# Patient Record
Sex: Female | Born: 1965 | Race: Black or African American | Hispanic: No | Smoking: Current every day smoker
Health system: Southern US, Community
[De-identification: ages and names within clinical notes are randomized; demographics above are authoritative.]

## PROBLEM LIST (undated history)

## (undated) DIAGNOSIS — F329 Major depressive disorder, single episode, unspecified: Secondary | ICD-10-CM

## (undated) DIAGNOSIS — R0600 Dyspnea, unspecified: Secondary | ICD-10-CM

## (undated) DIAGNOSIS — F32A Depression, unspecified: Secondary | ICD-10-CM

## (undated) DIAGNOSIS — Z923 Personal history of irradiation: Secondary | ICD-10-CM

## (undated) DIAGNOSIS — Z9221 Personal history of antineoplastic chemotherapy: Secondary | ICD-10-CM

## (undated) DIAGNOSIS — Z800 Family history of malignant neoplasm of digestive organs: Secondary | ICD-10-CM

## (undated) DIAGNOSIS — F17200 Nicotine dependence, unspecified, uncomplicated: Secondary | ICD-10-CM

## (undated) DIAGNOSIS — Z803 Family history of malignant neoplasm of breast: Secondary | ICD-10-CM

## (undated) DIAGNOSIS — D649 Anemia, unspecified: Secondary | ICD-10-CM

## (undated) DIAGNOSIS — I319 Disease of pericardium, unspecified: Secondary | ICD-10-CM

## (undated) DIAGNOSIS — C801 Malignant (primary) neoplasm, unspecified: Secondary | ICD-10-CM

## (undated) DIAGNOSIS — J45909 Unspecified asthma, uncomplicated: Secondary | ICD-10-CM

## (undated) DIAGNOSIS — K429 Umbilical hernia without obstruction or gangrene: Secondary | ICD-10-CM

## (undated) HISTORY — DX: Depression, unspecified: F32.A

## (undated) HISTORY — DX: Nicotine dependence, unspecified, uncomplicated: F17.200

## (undated) HISTORY — DX: Umbilical hernia without obstruction or gangrene: K42.9

## (undated) HISTORY — DX: Family history of malignant neoplasm of breast: Z80.3

## (undated) HISTORY — DX: Family history of malignant neoplasm of digestive organs: Z80.0

## (undated) HISTORY — DX: Major depressive disorder, single episode, unspecified: F32.9

## (undated) HISTORY — PX: ABLATION ON ENDOMETRIOSIS: SHX5787

## (undated) HISTORY — PX: BREAST BIOPSY: SHX20

## (undated) HISTORY — PX: TUBAL LIGATION: SHX77

## (undated) NOTE — *Deleted (*Deleted)
Patient Care Team: Avanell Shackleton, NP-C as PCP - General (Family Medicine) Quintella Reichert, MD as PCP - Cardiology (Cardiology) Donnelly Angelica, RN as Oncology Nurse Navigator Pershing Proud, RN as Oncology Nurse Navigator Harriette Bouillon, MD as Consulting Physician (General Surgery) Serena Croissant, MD as Consulting Physician (Hematology and Oncology) Antony Blackbird, MD as Consulting Physician (Radiation Oncology)  DIAGNOSIS: No diagnosis found.  SUMMARY OF ONCOLOGIC HISTORY: Oncology History  Malignant neoplasm of 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, estrogen receptor positive (HCC)  08/25/2020 Initial Diagnosis   Patient palpated a right breast lump x31yr and a left breast lump x33yr. Diagnostic mammogram and US showed in the right breast, a 2.7cm mass 5cm from the nipple and 0.8cm mass 1cm from the nipple at the 12:30 position, with borderline cortical thickening in the right axilla, and in the left breast, a 4.0cm mass at the 11 o'clock position representing a hamartoma. 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   09/13/2020 -  Chemotherapy   The patient had dexamethasone (DECADRON) 4 MG tablet, 8 mg, Oral, Daily, 1 of 1 cycle, Start date: 09/01/2020, End date: 10/04/2020 palonosetron (ALOXI) injection 0.25 mg, 0.25 mg, Intravenous,  Once, 3 of 6 cycles Administration: 0.25 mg (09/13/2020), 0.25 mg (10/04/2020), 0.25 mg (10/25/2020) pegfilgrastim-jmdb (FULPHILA) injection 6 mg, 6 mg, Subcutaneous,  Once, 1 of 1 cycle pegfilgrastim-cbqv (UDENYCA) injection 6 mg, 6 mg, Subcutaneous, Once, 3 of 6 cycles Administration: 6 mg (09/15/2020), 6 mg (10/06/2020) CARBOplatin (PARAPLATIN) 560 mg in sodium chloride 0.9 % 250 mL chemo infusion, 559.8 mg (100 % of original dose 559.8 mg), Intravenous,  Once, 3 of 6 cycles Dose modification:   (original  dose 559.8 mg, Cycle 1) Administration: 560 mg (09/13/2020), 560 mg (10/04/2020), 560 mg (10/25/2020) DOCEtaxel (TAXOTERE) 120 mg in sodium chloride 0.9 % 250 mL chemo infusion, 75 mg/m2 = 120 mg, Intravenous,  Once, 3 of 6 cycles Administration: 120 mg (09/13/2020), 120 mg (10/04/2020), 120 mg (10/25/2020) fosaprepitant (EMEND) 150 mg in sodium chloride 0.9 % 145 mL IVPB, 150 mg, Intravenous,  Once, 3 of 6 cycles Administration: 150 mg (09/13/2020), 150 mg (10/04/2020), 150 mg (10/25/2020) pertuzumab (PERJETA) 420 mg in sodium chloride 0.9 % 250 mL chemo infusion, 420 mg (100 % of original dose 420 mg), Intravenous, Once, 3 of 6 cycles Dose modification: 420 mg (original dose 420 mg, Cycle 1, Reason: Provider Judgment) Administration: 420 mg (09/13/2020), 420 mg (10/04/2020), 420 mg (10/25/2020) trastuzumab-anns (KANJINTI) 420 mg in sodium chloride 0.9 % 250 mL chemo infusion, 8 mg/kg = 420 mg, Intravenous,  Once, 3 of 6 cycles Administration: 420 mg (09/13/2020), 300 mg (10/04/2020), 300 mg (10/25/2020)  for chemotherapy treatment.      CHIEF COMPLIANT: Cycle 4 TCH Perjeta  INTERVAL HISTORY: Amanda Rich is a 27 y.o. with above-mentioned history of right breast cancer was currently on neoadjuvant chemotherapy with TCH Perjeta. She presents to the clinic today for cycle 4.   ALLERGIES:  is allergic to clarithromycin.  MEDICATIONS:  Current Outpatient Medications  Medication Sig Dispense Refill  . acetaminophen (TYLENOL) 500 MG tablet Take 500 mg by mouth every 6 (six) hours as needed for mild pain, fever or headache.    . Biotin 5000 MCG CAPS Take 5,000 mcg by mouth daily.     . Cholecalciferol (VITAMIN D3) 2000  units TABS Take 2,000 Units by mouth daily.     . citalopram (CELEXA) 10 MG tablet Take 1 tablet (10 mg total) by mouth daily. 90 tablet 3  . guaiFENesin (MUCINEX) 600 MG 12 hr tablet Take 600 mg by mouth 2 (two) times daily as needed for cough.    . lidocaine-prilocaine (EMLA) cream  Apply to affected area once (Patient taking differently: Apply 1 application topically as needed (port access). ) 30 g 3  . loperamide (IMODIUM) 2 MG capsule Take 1 capsule (2 mg total) by mouth as needed for diarrhea or loose stools. 30 capsule 0  . loratadine (CLARITIN) 10 MG tablet Take 1 tablet (10 mg total) by mouth daily.    Marland Kitchen LORazepam (ATIVAN) 0.5 MG tablet Take 1 tablet (0.5 mg total) by mouth at bedtime as needed (Nausea or vomiting). 30 tablet 0  . Multiple Vitamin (MULTIVITAMIN WITH MINERALS) TABS Take 1 tablet by mouth daily.    Marland Kitchen omeprazole (PRILOSEC) 20 MG capsule Take 1 capsule (20 mg total) by mouth daily.    . ondansetron (ZOFRAN) 8 MG tablet Take 1 tablet (8 mg total) by mouth 2 (two) times daily as needed (Nausea or vomiting). Start on the third day after chemotherapy. 30 tablet 1  . prochlorperazine (COMPAZINE) 10 MG tablet Take 1 tablet (10 mg total) by mouth every 6 (six) hours as needed (Nausea or vomiting). 30 tablet 1  . TURMERIC PO Take 1 tablet by mouth daily.      No current facility-administered medications for this visit.    PHYSICAL EXAMINATION: ECOG PERFORMANCE STATUS: {CHL ONC ECOG PS:[phone]}  There were no vitals filed for this visit. There were no vitals filed for this visit.   LABORATORY DATA:  I have reviewed the data as listed CMP Latest Ref Rng & Units 10/30/2020 10/25/2020 10/04/2020  Glucose 70 - 99 mg/dL 161(W) 93 960(A)  BUN 6 - 20 mg/dL 10 10 9   Creatinine 0.44 - 1.00 mg/dL 5.40 9.81 1.91  Sodium 135 - 145 mmol/L 137 141 140  Potassium 3.5 - 5.1 mmol/L 4.1 3.4(L) 3.4(L)  Chloride 98 - 111 mmol/L 101 108 106  CO2 22 - 32 mmol/L 27 25 27   Calcium 8.9 - 10.3 mg/dL 9.1 9.0 9.7  Total Protein 6.5 - 8.1 g/dL 7.3 6.2(L) 6.9  Total Bilirubin 0.3 - 1.2 mg/dL 0.9 0.3 4.7(W)  Alkaline Phos 38 - 126 U/L 91 80 85  AST 15 - 41 U/L 50(H) 42(H) 67(H)  ALT 0 - 44 U/L 166(H) 53(H) 139(H)    Lab Results  Component Value Date   WBC 6.4 10/30/2020    HGB 10.7 (L) 10/30/2020   HCT 32.4 (L) 10/30/2020   MCV 88.3 10/30/2020   PLT 136 (L) 10/30/2020   NEUTROABS 4.3 10/30/2020    ASSESSMENT & PLAN:  No problem-specific Assessment & Plan notes found for this encounter.    No orders of the defined types were placed in this encounter.  The patient has a good understanding of the overall plan. she agrees with it. she will call with any problems that may develop before the next visit here.  Total time spent: *** mins including face to face time and time spent for planning, charting and coordination of care  Serena Croissant, MD 11/14/2020  I, Kirt Boys Dorshimer, am acting as scribe for Dr. Serena Croissant.  {insert scribe attestation}

---

## 2003-12-26 HISTORY — PX: UMBILICAL HERNIA REPAIR: SHX196

## 2005-12-25 HISTORY — PX: GANGLION CYST EXCISION: SHX1691

## 2006-02-07 ENCOUNTER — Encounter (INDEPENDENT_AMBULATORY_CARE_PROVIDER_SITE_OTHER): Payer: Self-pay | Admitting: Orthopaedic Surgery

## 2006-02-08 ENCOUNTER — Ambulatory Visit (HOSPITAL_COMMUNITY): Admission: RE | Admit: 2006-02-08 | Discharge: 2006-02-08 | Payer: Self-pay | Admitting: Orthopaedic Surgery

## 2009-03-02 ENCOUNTER — Ambulatory Visit (HOSPITAL_COMMUNITY): Admission: RE | Admit: 2009-03-02 | Discharge: 2009-03-02 | Payer: Self-pay | Admitting: Family Medicine

## 2009-03-02 IMAGING — MG MM DIGITAL [PERSON_NAME] SCREEN BILAT
5 series · 5 of 5 positions shown · non-contrast
Comparison: none

DG WINNY SCREENING BILAT
Bilateral CC and MLO view(s) were taken.

DIGITAL SCREENING MAMMOGRAM WITH CAD:
The breast tissue is heterogeneously dense.  There is no dominant mass, architectural distortion or
calcification to suggest malignancy.

[R CC]
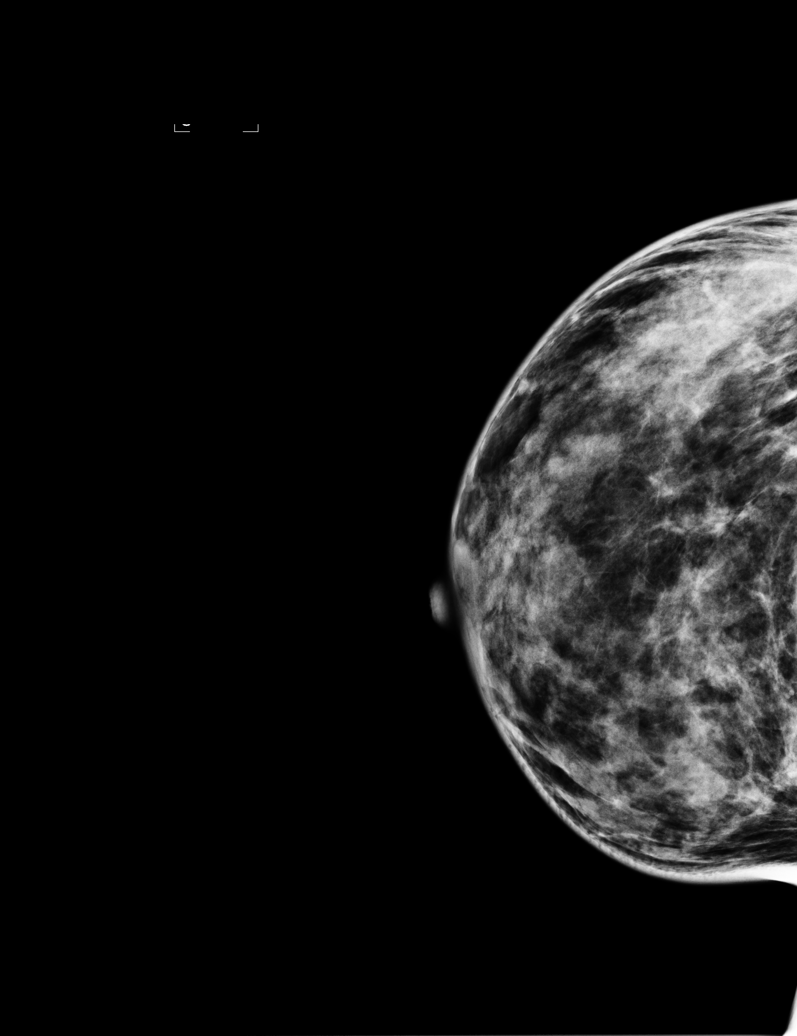

[R MLO]
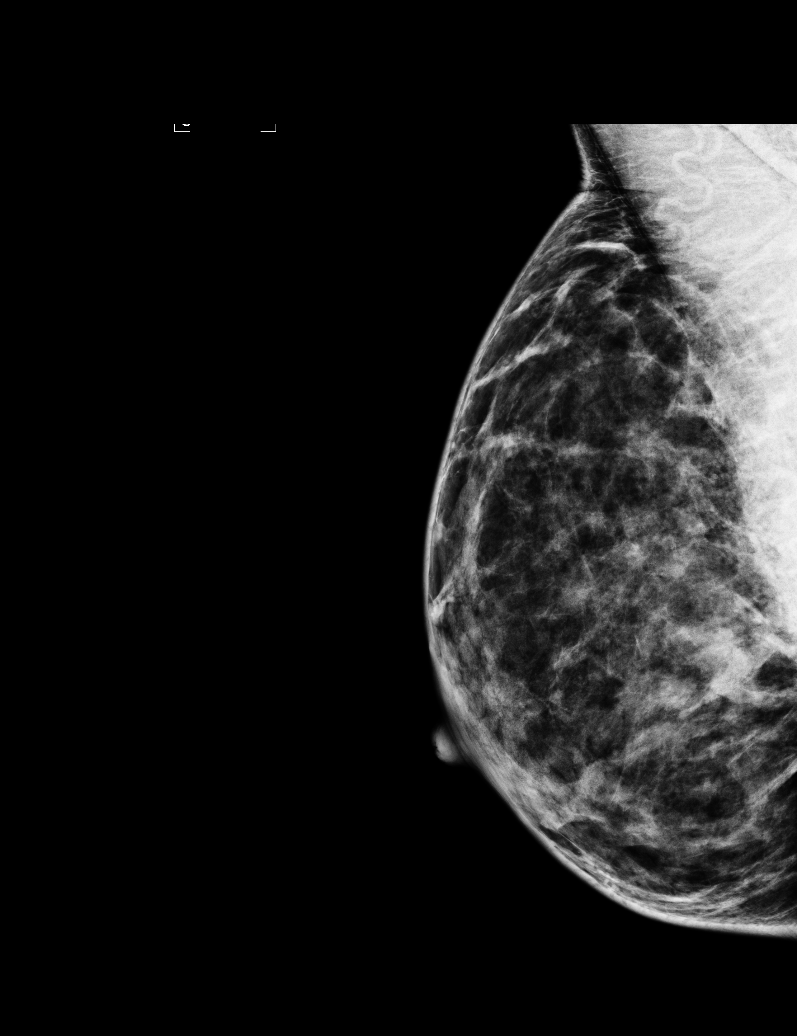

[L CC]
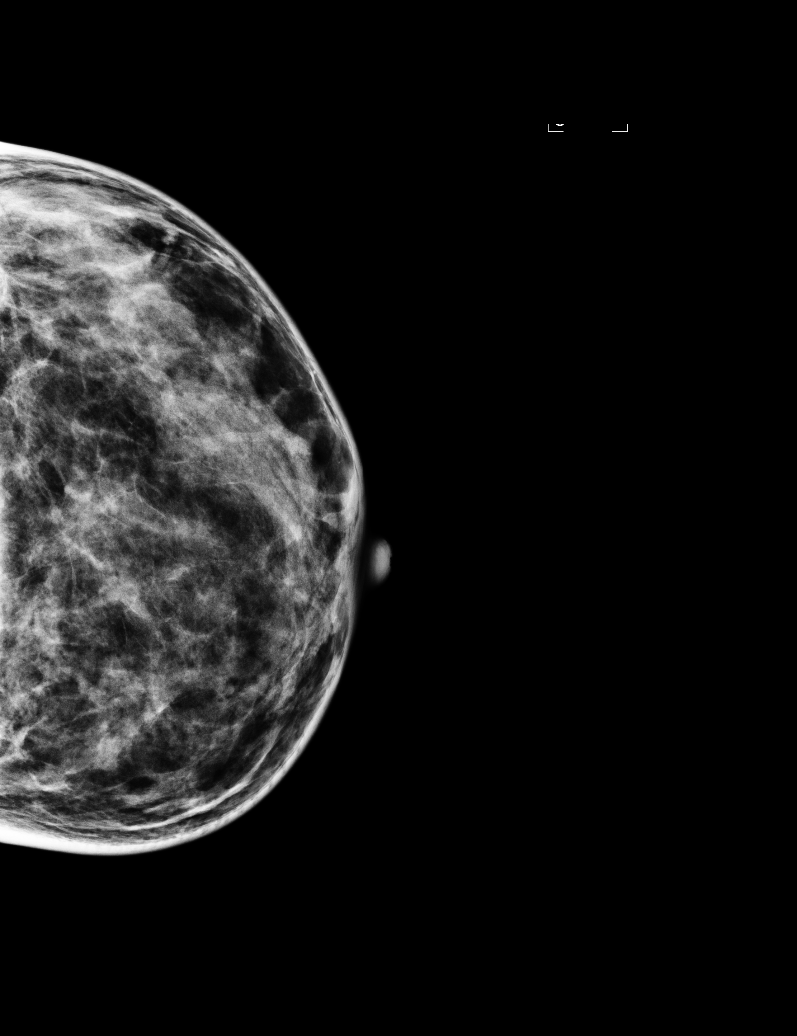

[L MLO (1 of 2)]
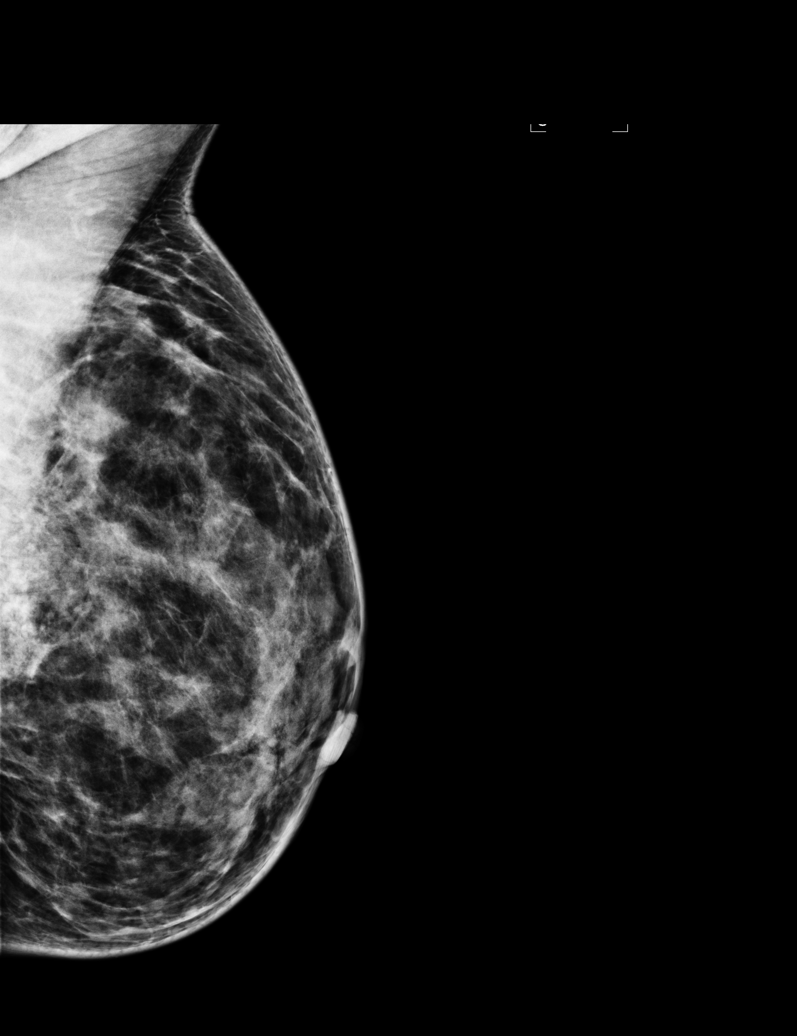

[L MLO (2 of 2)]
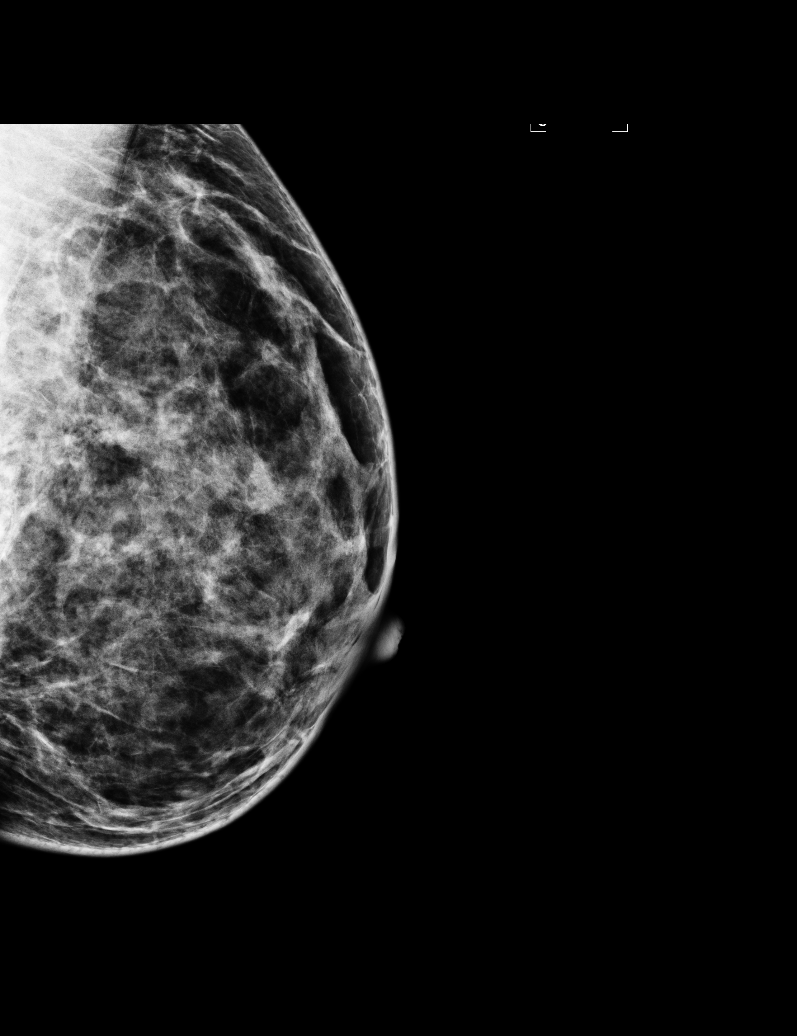

[5 of 5 positions shown; findings below may reference images not displayed]

IMPRESSION: No mammographic evidence of malignancy.  Suggest yearly screening mammography.

A result letter of this screening mammogram will be mailed directly to the patient.

ASSESSMENT: Negative - BI-RADS 1

Screening mammogram in 1 year.
THIS WAS ANALAYZED BY COMPUTER AIDED DETECTION. ,

## 2009-05-17 ENCOUNTER — Ambulatory Visit (HOSPITAL_COMMUNITY): Admission: RE | Admit: 2009-05-17 | Discharge: 2009-05-17 | Payer: Self-pay | Admitting: Family Medicine

## 2009-05-17 LAB — HM DEXA SCAN: HM Dexa Scan: NORMAL

## 2010-06-06 ENCOUNTER — Emergency Department (HOSPITAL_COMMUNITY): Admission: EM | Admit: 2010-06-06 | Discharge: 2010-06-06 | Payer: Self-pay | Admitting: Emergency Medicine

## 2010-12-31 ENCOUNTER — Emergency Department (HOSPITAL_COMMUNITY)
Admission: EM | Admit: 2010-12-31 | Discharge: 2011-01-01 | Payer: Self-pay | Source: Home / Self Care | Admitting: Emergency Medicine

## 2011-01-09 LAB — RAPID STREP SCREEN (MED CTR MEBANE ONLY): Streptococcus, Group A Screen (Direct): NEGATIVE

## 2011-01-16 ENCOUNTER — Encounter: Payer: Self-pay | Admitting: Family Medicine

## 2011-05-06 ENCOUNTER — Emergency Department (HOSPITAL_COMMUNITY)
Admission: EM | Admit: 2011-05-06 | Discharge: 2011-05-07 | Disposition: A | Discharge status: Home / Self Care | Payer: Self-pay | Attending: Emergency Medicine | Admitting: Emergency Medicine

## 2011-05-06 DIAGNOSIS — F17200 Nicotine dependence, unspecified, uncomplicated: Secondary | ICD-10-CM | POA: Insufficient documentation

## 2011-05-06 DIAGNOSIS — R109 Unspecified abdominal pain: Secondary | ICD-10-CM | POA: Insufficient documentation

## 2011-05-06 DIAGNOSIS — J45909 Unspecified asthma, uncomplicated: Secondary | ICD-10-CM | POA: Insufficient documentation

## 2011-05-07 ENCOUNTER — Emergency Department (HOSPITAL_COMMUNITY): Payer: Self-pay

## 2011-05-07 LAB — URINALYSIS, ROUTINE W REFLEX MICROSCOPIC
Bilirubin Urine: NEGATIVE
Glucose, UA: NEGATIVE mg/dL
Ketones, ur: NEGATIVE mg/dL
Nitrite: NEGATIVE
Protein, ur: NEGATIVE mg/dL
Specific Gravity, Urine: 1.02 (ref 1.005–1.030)
Urobilinogen, UA: 2 mg/dL — ABNORMAL HIGH (ref 0.0–1.0)
pH: 7.5 (ref 5.0–8.0)

## 2011-05-07 LAB — URINE MICROSCOPIC-ADD ON

## 2011-05-07 IMAGING — CR DG ABDOMEN 2V
2 series · 2 of 2 positions shown · non-contrast
Comparison: None

CLINICAL DATA: Abdominal pain, umbilical pain, cramping

ABDOMEN - 2 VIEW

[view not recorded (1 of 2)]
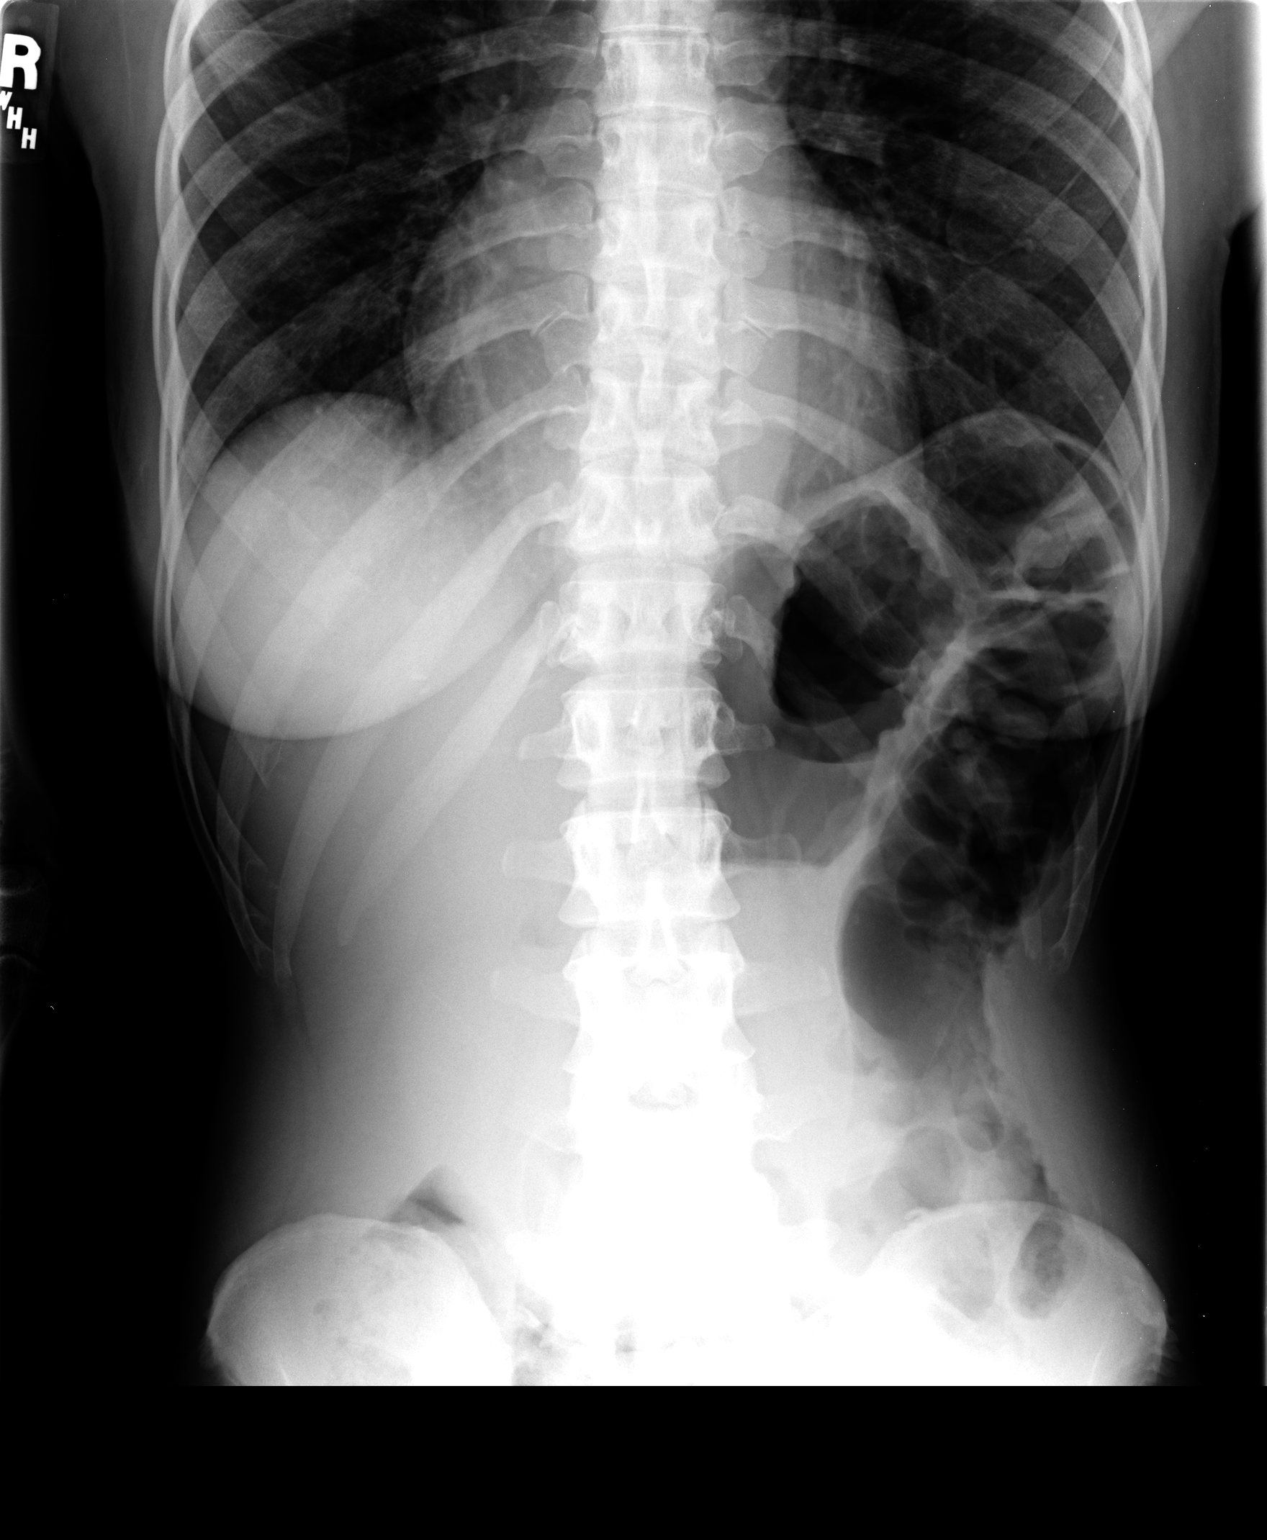

[view not recorded (2 of 2)]
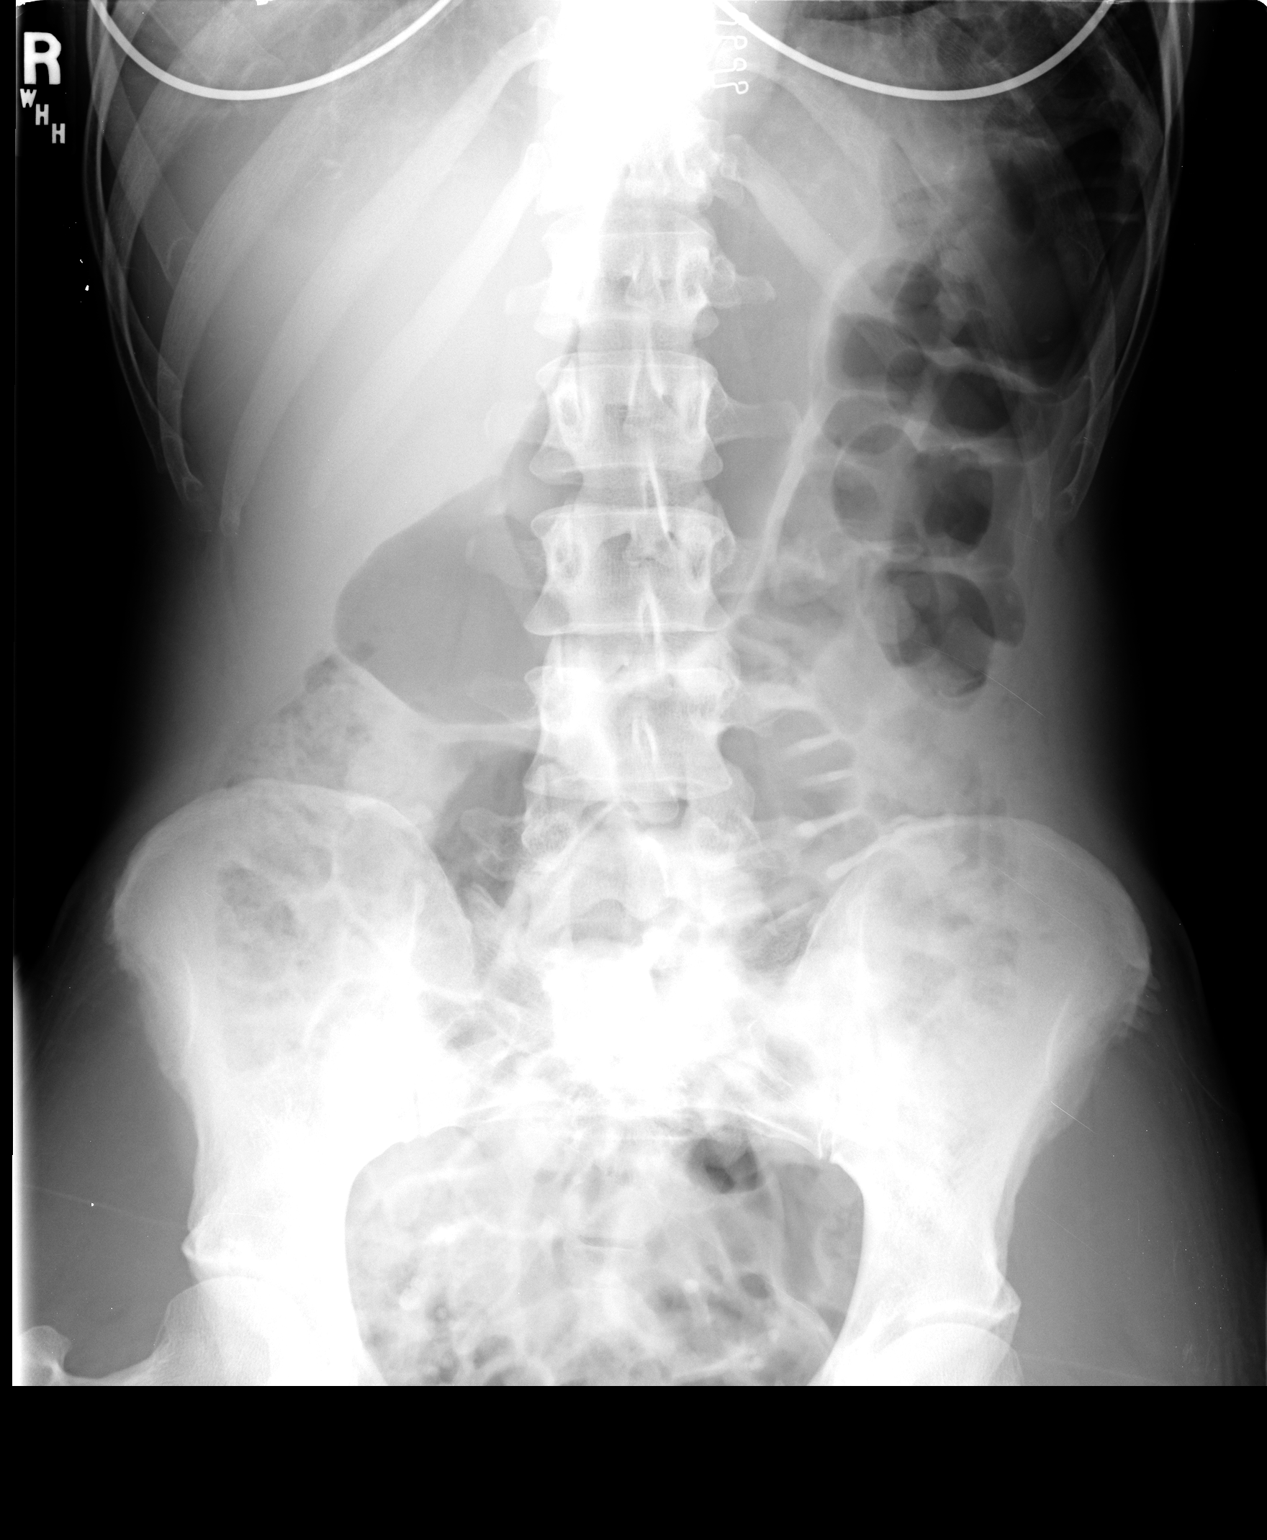

[2 of 2 positions shown; findings below may reference images not displayed]

FINDINGS: Air filled loops of large and small bowel throughout abdomen.
Minimal gaseous distention of splenic flexure.
Overall bowel gas pattern is nonobstructive.
No bowel wall thickening or free intraperitoneal air.
Lung bases clear.
Osseous structures unremarkable.
No urinary tract calcification.
IMPRESSION: Nonobstructive bowel gas pattern.

## 2011-05-12 NOTE — Op Note (Signed)
NAME:  Amanda Rich, Amanda Rich             ACCOUNT NO.:  [id number]   MEDICAL RECORD NO.:  [id number]          PATIENT TYPE:  AMB   LOCATION:  DAY                           FACILITY:  [facility]   PHYSICIAN:  J. Darreld Mclean, M.D. DATE OF BIRTH:  1966/06/19   DATE OF PROCEDURE:  02/08/2006  DATE OF DISCHARGE:                                 OPERATIVE REPORT   PREOPERATIVE DIAGNOSIS:  Ganglion cyst, left volar wrist and radial artery.   POSTOPERATIVE DIAGNOSIS:  Ganglion cyst, left volar wrist and radial artery.   PROCEDURE:  Excision of ganglion cyst, left volar wrist.   ANESTHESIA:  Bier block.   SURGEON:  Dr. Hilda Lias.   Volar plasty splint applied at the end of the procedure. No drains were  used.   INDICATIONS:  The patient is a 45 year old female with pain and tenderness  of the left wrist. She had a large cystic lesion that is painful. It has  been aspirated several times over the last several years but keeps coming  back. Each time, it gets a little bigger and more painful. It has limited  the action of her wrist, and she would like to have it excised at this time.  She understands that it can recur. Risks and imponderables were discussed  with the patient preoperatively. She appeared to understand and agreed to  the procedure as outlined.   DESCRIPTION OF PROCEDURE:  The patient was seen in the holding area, and the  left wrist was identified as the correct surgical site. She was placed a  mark over the cyst, and I placed a mark over it. She was brought back to the  operating room and given Bier block anesthesia while supine on the operating  room table. The hand table was attached. Completed a time out, identified  the patient and during surgery on the left wrist. She was prepped and draped  in the usual manner. Incision made over the lesion in line with the skin  lines. I used loop magnification. With careful dissection, the cyst was  exposed. With careful meticulous dissection,  the radial artery was  identified, and it went right down through almost the center of the cyst on  the most inferior aspect. With careful dissection, the so-called stalk was  identified. Radial artery was protected. The so-called stalk area was  removed along with the cyst. I used the Bovie around the area of the so-  called stalk on the area that was coming deep within the wrist. Artery was  reinspected. No apparent injury. Wounds were reapproximated using 3-0  chromic, 3-0 plain and a running subcuticular 3-0 nylon suture. Sterile  dressing applied. Bulky dressing applied. Volar plasty splint applied. Ace  bandage applied loosely. The patient tolerated the procedure well and went  to recovery room in good condition. I  will see in the office in approximately 10 days to 2 weeks. She has no  allergies and will be given a prescription for Vicodin ES for pain. If any  difficulties, she can contact me through the office or hospital beeper  system. Numbers have been provided.  ______________________________  Shela Commons. Darreld Mclean, M.D.     JWK/MEDQ  D:  02/08/2006  T:  02/08/2006  Job:  [id number]

## 2011-05-12 NOTE — H&P (Signed)
NAME:  Amanda Rich, Amanda Rich             ACCOUNT NO.:  [id number]   MEDICAL RECORD NO.:  [id number]          PATIENT TYPE:  AMB   LOCATION:  DAY                           FACILITY:  [facility]   PHYSICIAN:  J. Darreld Mclean, M.D. DATE OF BIRTH:  12-08-1966   DATE OF ADMISSION:  DATE OF DISCHARGE:  LH                                HISTORY & PHYSICAL   CHIEF COMPLAINT:  I have got a cyst on my left wrist.   The patient is a 45 year old female with a large ganglion cyst to the left  volar wrist at the radial artery.  This has been present for some time.  It  was last excised here in the office in March 2005.  It had been present back  in 2004.  Each time it comes back, she says it is a little bigger and a  little larger, and just more painful.  She would like to have it excised at  this time.  She denies any falls or trauma to the wrist.   PAST HISTORY:  Negative.  She does have a history of asthma since childhood.   She has no allergies.   She currently uses vitamins and Flexeril.  She has used albuterol inhalers  as needed.   She smokes a pack a day, does not use alcoholic beverages, high school  graduate.  She goes to Dr. Lysbeth Galas in [place] as her family physician.   She is status post repair of an umbilical hernia in 2003.   The patient lives in [place].  She is married.  She works at [employer].   PHYSICAL EXAMINATION:  VITAL SIGNS:  Normal.  HEENT:  Negative.  NECK:  Supple.  LUNGS:  Clear to P&A.  HEART:  Regular without murmur heard.  ABDOMEN:  Soft nontender without masses.  EXTREMITIES:  A large ganglion cyst, left dorsal wrist, about the size of a  large grape.  It is over the radial artery area.  It is tender.  range of  motion of the wrist is full but she says it hurts when she has full flexion.  There are no neurologic problems.  CNS:  Intact.  SKIN:  Intact.   IMPRESSION:  Large ganglion cyst, left dorsal wrist.   PLAN:  Excision of ganglion cyst, left  dorsal wrist.  __________  radial  artery.  We need to be careful of any possible injury to that.  She is  scheduled as an outpatient.  She understands she will be in a splint for  approximately two weeks post surgery.   LABORATORY:  Pending.                                            ______________________________  J. Darreld Mclean, M.D.     JWK/MEDQ  D:  02/06/2006  T:  02/06/2006  Job:  [id number]

## 2012-02-27 ENCOUNTER — Other Ambulatory Visit (HOSPITAL_COMMUNITY): Payer: Self-pay | Admitting: Family Medicine

## 2012-02-27 DIAGNOSIS — N63 Unspecified lump in unspecified breast: Secondary | ICD-10-CM

## 2012-03-13 ENCOUNTER — Inpatient Hospital Stay (HOSPITAL_COMMUNITY): Admission: RE | Admit: 2012-03-13 | Payer: Self-pay | Source: Ambulatory Visit

## 2012-03-27 ENCOUNTER — Inpatient Hospital Stay (HOSPITAL_COMMUNITY): Admission: RE | Admit: 2012-03-27 | Payer: Self-pay | Source: Ambulatory Visit

## 2012-04-17 ENCOUNTER — Other Ambulatory Visit (HOSPITAL_COMMUNITY): Payer: Self-pay | Admitting: Family Medicine

## 2012-04-17 ENCOUNTER — Ambulatory Visit (HOSPITAL_COMMUNITY)
Admission: RE | Admit: 2012-04-17 | Discharge: 2012-04-17 | Disposition: A | Discharge status: Home / Self Care | Payer: Self-pay | Source: Ambulatory Visit | Attending: Family Medicine | Admitting: Family Medicine

## 2012-04-17 DIAGNOSIS — N63 Unspecified lump in unspecified breast: Secondary | ICD-10-CM

## 2012-04-17 DIAGNOSIS — D249 Benign neoplasm of unspecified breast: Secondary | ICD-10-CM | POA: Insufficient documentation

## 2012-07-08 ENCOUNTER — Encounter (HOSPITAL_COMMUNITY): Payer: Self-pay | Admitting: Pharmacy Technician

## 2012-07-09 NOTE — Patient Instructions (Addendum)
22 Amanda Rich  07/09/2012   Your procedure is scheduled on:  Tuesday, 07/16/12  Report to [facility] at 0840 AM.  Call this number if you have problems the morning of surgery: [phone]   Remember:   Do not eat food:After Midnight.  May have clear liquids:until Midnight .  Clear liquids include soda, tea, black coffee, apple or grape juice, broth.  Take these medicines the morning of surgery with A SIP OF WATER: none   Do not wear jewelry, make-up or nail polish.  Do not wear lotions, powders, or perfumes. You may wear deodorant.  Do not shave 48 hours prior to surgery. Men may shave face and neck.  Do not bring valuables to the hospital.  Contacts, dentures or bridgework may not be worn into surgery.  Leave suitcase in the car. After surgery it may be brought to your room.  For patients admitted to the hospital, checkout time is 11:00 AM the day of discharge.   Patients discharged the day of surgery will not be allowed to drive home.  Name and phone number of your driver: family  Special Instructions: CHG Shower Use Special Wash: 1/2 bottle night before surgery and 1/2 bottle morning of surgery.   Please read over the following fact sheets that you were given: Pain Booklet, MRSA Information, Surgical Site Infection Prevention, Anesthesia Post-op Instructions and Care and Recovery After Surgery   Dilation and Curettage or Vacuum Curettage Dilation and curettage (D&C) and vacuum curettage are minor procedures. A D&C involves stretching (dilation) the cervix and scraping (curettage) the inside lining of the womb (uterus). During a D&C, tissue is gently scraped from the inside lining of the uterus. During a vacuum curettage, the lining and tissue in the uterus are removed with the use of gentle suction. Curettage may be performed for diagnostic or therapeutic purposes. As a diagnostic procedure, curettage is performed for the purpose of examining tissues from the uterus. Tissue  examination may help determine causes or treatment options for symptoms. A diagnostic curettage may be performed for the following symptoms:  Irregular bleeding in the uterus.   Bleeding with the development of clots.   Spotting between menstrual periods.   Prolonged menstrual periods.   Bleeding after menopause.   No menstrual period (amenorrhea).   A change in size and shape of the uterus.  A therapeutic curettage is performed to remove tissue, blood, or a contraceptive device. Therapeutic curettage may be performed for the following conditions:   Removal of an IUD (intrauterine device).   Removal of retained placenta after giving birth. Retained placenta can cause bleeding severe enough to require transfusions or an infection.   Abortion.   Miscarriage.   Removal of polyps inside the uterus.   Removal of uncommon types of fibroids (noncancerous lumps).  LET YOUR CAREGIVER KNOW ABOUT:   Allergies to food or medicine.   Medicines taken, including vitamins, herbs, eyedrops, over-the-counter medicines, and creams.   Use of steroids (by mouth or creams).   Previous problems with anesthetics or numbing medicines.   History of bleeding problems or blood clots.   Previous surgery.   Other health problems, including diabetes and kidney problems.   Possibility of pregnancy, if this applies.  RISKS AND COMPLICATIONS   Excessive bleeding.   Infection of the uterus.   Damage to the cervix.   Development of scar tissue (adhesions) inside the uterus, later causing abnormal amounts of menstrual bleeding.   Complications from the general anesthetic, if a  general anesthetic is used.   Putting a hole (perforation) in the uterus. This is rare.  BEFORE THE PROCEDURE   Eat and drink before the procedure only as directed by your caregiver.   Arrange for someone to take you home.  PROCEDURE   This procedure may be done in a hospital, outpatient clinic, or caregiver's  office.   You may be given a general anesthetic or a local anesthetic in and around the cervix.   You will lie on your back with your legs in stirrups.   There are two ways in which your cervix can be softened and dilated. These include:   Taking a medicine.   Having thin rods (laminaria) inserted into your cervix.   A curved tool (curette) will scrape cells from the inside lining of the uterus and will then be removed.  This procedure usually takes about 15 to 30 minutes. AFTER THE PROCEDURE   You will rest in the recovery area until you are stable and are ready to go home.   You will need to have someone take you home.   You may feel sick to your stomach (nauseous) or throw up (vomit) if you had general anesthesia.   You may have a sore throat if a tube was placed in your throat during general anesthesia.   You may have light cramping and bleeding for 2 days to 2 weeks after the procedure.   Your uterus needs to make a new lining after the procedure. This may make your next period late.  Document Released: 12/11/2005 Document Revised: 11/30/2011 Document Reviewed: 07/09/2009 [facility] Patient Information 2012 [place], [place]. PATIENT INSTRUCTIONS POST-ANESTHESIA  IMMEDIATELY FOLLOWING SURGERY:  Do not drive or operate machinery for the first twenty four hours after surgery.  Do not make any important decisions for twenty four hours after surgery or while taking narcotic pain medications or sedatives.  If you develop intractable nausea and vomiting or a severe headache please notify your doctor immediately.  FOLLOW-UP:  Please make an appointment with your surgeon as instructed. You do not need to follow up with anesthesia unless specifically instructed to do so.  WOUND CARE INSTRUCTIONS (if applicable):  Keep a dry clean dressing on the anesthesia/puncture wound site if there is drainage.  Once the wound has quit draining you may leave it open to air.  Generally you should leave  the bandage intact for twenty four hours unless there is drainage.  If the epidural site drains for more than 36-48 hours please call the anesthesia department.  QUESTIONS?:  Please feel free to call your physician or the hospital operator if you have any questions, and they will be happy to assist you.

## 2012-07-10 ENCOUNTER — Other Ambulatory Visit: Payer: Self-pay | Admitting: Obstetrics and Gynecology

## 2012-07-10 ENCOUNTER — Encounter (HOSPITAL_COMMUNITY)
Admission: RE | Admit: 2012-07-10 | Discharge: 2012-07-10 | Disposition: A | Discharge status: Home / Self Care | Payer: BC Managed Care – PPO | Source: Ambulatory Visit | Attending: Obstetrics and Gynecology | Admitting: Obstetrics and Gynecology

## 2012-07-10 ENCOUNTER — Encounter (HOSPITAL_COMMUNITY): Payer: Self-pay

## 2012-07-10 HISTORY — DX: Anemia, unspecified: D64.9

## 2012-07-10 LAB — CBC
HCT: 33.4 % — ABNORMAL LOW (ref 36.0–46.0)
Hemoglobin: 10.9 g/dL — ABNORMAL LOW (ref 12.0–15.0)
MCH: 27.7 pg (ref 26.0–34.0)
MCHC: 32.6 g/dL (ref 30.0–36.0)
MCV: 85 fL (ref 78.0–100.0)
Platelets: 217 10*3/uL (ref 150–400)
RBC: 3.93 MIL/uL (ref 3.87–5.11)
RDW: 14.6 % (ref 11.5–15.5)
WBC: 4.9 10*3/uL (ref 4.0–10.5)

## 2012-07-10 LAB — URINALYSIS, ROUTINE W REFLEX MICROSCOPIC
Bilirubin Urine: NEGATIVE
Glucose, UA: NEGATIVE mg/dL
Ketones, ur: NEGATIVE mg/dL
Leukocytes, UA: NEGATIVE
Nitrite: NEGATIVE
Protein, ur: NEGATIVE mg/dL
Specific Gravity, Urine: <1.005 — ABNORMAL LOW (ref 1.005–1.030)
Urobilinogen, UA: 0.2 mg/dL (ref 0.0–1.0)
pH: 6 (ref 5.0–8.0)

## 2012-07-10 LAB — SURGICAL PCR SCREEN
MRSA, PCR: NEGATIVE
Staphylococcus aureus: NEGATIVE

## 2012-07-10 LAB — HCG, SERUM, QUALITATIVE: Preg, Serum: NEGATIVE

## 2012-07-10 LAB — URINE MICROSCOPIC-ADD ON

## 2012-07-10 NOTE — H&P (Signed)
Amanda Rich is an 46 y.o. female. She is a gravida 3 para 3 status post tubal ligation with LMP 6/17-25/ 2013 she is admitted for hysteroscopy D&C and endometrial ablation for heavy menses that are also describes painful. She describes the pain of endometrial biopsy has equal to the day 1 and 2 of her menses which was benign 07/05/2012.  Pertinent Gynecological History: Menses: flow is excessive with use of Both pads or tampons on heaviest days Bleeding: No bleeding for 2 years until January 2013 with heavy excessive menstrual periods Since that time Contraception: tubal ligation DES exposure: unknown Blood transfusions: none Sexually transmitted diseases: no past history Previous GYN Procedures: 2 tubal ligation Last mammogram: Not applicable Date:  Last pap: normal Date: May 2013 OB History: G3, P3   Menstrual History: Menarche age: 57 No LMP recorded.    Past Medical History  Diagnosis Date  . Anemia     Past Surgical History  Procedure Date  . Tubal ligation     [facility]  . Umbilical hernia repair 2005    [facility]  . Ganglion cyst excision 2007    L wrist; [facility], [place]    No family history on file.  Social History:  reports that she has been smoking Cigarettes.  She has a 12.5 pack-year smoking history. She does not have any smokeless tobacco history on file. She reports that she drinks about .6 ounces of alcohol per week. She reports that she does not use illicit drugs.  Allergies: No Known Allergies  No prescriptions prior to admission    ROS menstrual periods are sufficiently heavy patient masses or close at work she uses depends diaper it were to prevent accidents with her menses during the first 2 days of her cycle she has no urinary tract symptoms and no difficulties with bowel movements. 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 Physical Exam Physical Examination: General appearance - alert, well appearing, and in no  distress, oriented to person, place, and time and normal appearing weight Mouth - dental hygiene good Chest - clear to auscultation, no wheezes, rales or rhonchi, symmetric air entry Heart - normal rate, regular rhythm, normal S1, S2, no murmurs, rubs, clicks or gallops, normal rate and regular rhythm Abdomen - soft, nontender, nondistended, no masses or organomegaly Pelvic - VULVA: normal appearing vulva with no masses, tenderness or lesions,              VAGINA: normal appearing vagina with normal color and discharge, no lesions, PELVIC FLOOR EXAM: no cystocele, rectocele or prolapse noted,              CERVIX: normal appearing cervix without discharge or lesions,              UTERUS: uterus is normal size, shape, consistency and nontender, anteverted, mobile, ultrasound reveals small uterine fibroids in the fundal portion of the endometrial body to 1.5 cm in diameter. The uterus is 11.1 x 6.5 x 5 cm and normal ovaries bilaterally without free fluid or any evidence of adnexal masses,              ADNEXA: normal adnexa in size, nontender and no masses Extremities - peripheral pulses normal, no pedal edema, no clubbing or cyanosis, Homan's sign negative bilaterally   Results for orders placed during the hospital encounter of 07/10/12 (from the past 24 hour(s))  CBC     Status: Abnormal   Collection Time  07/10/12  8:00 AM      Component Value Range   WBC 4.9  4.0 - 10.5 K/uL   RBC 3.93  3.87 - 5.11 MIL/uL   Hemoglobin 10.9 (*) 12.0 - 15.0 g/dL   HCT 19.1 (*) 47.8 - 29.5 %   MCV 85.0  78.0 - 100.0 fL   MCH 27.7  26.0 - 34.0 pg   MCHC 32.6  30.0 - 36.0 g/dL   RDW 62.1  30.8 - 65.7 %   Platelets 217  150 - 400 K/uL  HCG, SERUM, QUALITATIVE     Status: Normal   Collection Time   07/10/12  8:00 AM      Component Value Range   Preg, Serum NEGATIVE  NEGATIVE  SURGICAL PCR SCREEN     Status: Normal   Collection Time   07/10/12  8:03 AM      Component Value Range   MRSA, PCR NEGATIVE   NEGATIVE   Staphylococcus aureus NEGATIVE  NEGATIVE  URINALYSIS, ROUTINE W REFLEX MICROSCOPIC     Status: Abnormal   Collection Time   07/10/12  8:03 AM      Component Value Range   Color, Urine YELLOW  YELLOW   APPearance CLEAR  CLEAR   Specific Gravity, Urine <1.005 (*) 1.005 - 1.030   pH 6.0  5.0 - 8.0   Glucose, UA NEGATIVE  NEGATIVE mg/dL   Hgb urine dipstick LARGE (*) NEGATIVE   Bilirubin Urine NEGATIVE  NEGATIVE   Ketones, ur NEGATIVE  NEGATIVE mg/dL   Protein, ur NEGATIVE  NEGATIVE mg/dL   Urobilinogen, UA 0.2  0.0 - 1.0 mg/dL   Nitrite NEGATIVE  NEGATIVE   Leukocytes, UA NEGATIVE  NEGATIVE  URINE MICROSCOPIC-ADD ON     Status: Abnormal   Collection Time   07/10/12  8:03 AM      Component Value Range   Squamous Epithelial / LPF FEW (*) RARE   WBC, UA 0-2  <3 WBC/hpf   RBC / HPF 7-10  <3 RBC/hpf    No results found.  Assessment/Plan:Menorrhagia severe    Mild anemia    Small uterine fibroids  Plan: Hysteroscopy dilation and curettage with endometrial ablation to be performed next Tuesday  Abdoulie Tierce V 07/10/2012, 1:41 PM

## 2012-07-16 ENCOUNTER — Ambulatory Visit (HOSPITAL_COMMUNITY)
Admission: RE | Admit: 2012-07-16 | Discharge: 2012-07-16 | Disposition: A | Discharge status: Home / Self Care | Payer: BC Managed Care – PPO | Source: Ambulatory Visit | Attending: Obstetrics and Gynecology | Admitting: Obstetrics and Gynecology

## 2012-07-16 ENCOUNTER — Encounter (HOSPITAL_COMMUNITY): Payer: Self-pay | Admitting: *Deleted

## 2012-07-16 ENCOUNTER — Other Ambulatory Visit: Payer: Self-pay

## 2012-07-16 ENCOUNTER — Encounter (HOSPITAL_COMMUNITY): Payer: Self-pay | Admitting: Certified Registered"

## 2012-07-16 ENCOUNTER — Encounter (HOSPITAL_COMMUNITY)
Admission: RE | Disposition: A | Discharge status: Home / Self Care | Payer: Self-pay | Source: Ambulatory Visit | Attending: Obstetrics and Gynecology

## 2012-07-16 ENCOUNTER — Ambulatory Visit (HOSPITAL_COMMUNITY): Payer: BC Managed Care – PPO | Admitting: Certified Registered"

## 2012-07-16 DIAGNOSIS — N920 Excessive and frequent menstruation with regular cycle: Secondary | ICD-10-CM | POA: Insufficient documentation

## 2012-07-16 DIAGNOSIS — N924 Excessive bleeding in the premenopausal period: Secondary | ICD-10-CM | POA: Diagnosis present

## 2012-07-16 DIAGNOSIS — Z01812 Encounter for preprocedural laboratory examination: Secondary | ICD-10-CM | POA: Insufficient documentation

## 2012-07-16 SURGERY — DILATATION & CURETTAGE/HYSTEROSCOPY WITH THERMACHOICE ABLATION
Anesthesia: General | Site: Uterus | Wound class: Clean Contaminated

## 2012-07-16 MED ORDER — CEFAZOLIN SODIUM-DEXTROSE 2-3 GM-% IV SOLR: 2.0000 g | INTRAVENOUS | Status: DC

## 2012-07-16 MED ORDER — FENTANYL CITRATE 0.05 MG/ML IJ SOLN
25.0000 ug | INTRAMUSCULAR | Status: DC | PRN
  Administered 2012-07-16 (×2): 50 ug via INTRAVENOUS

## 2012-07-16 MED ORDER — DEXTROSE 5 % IV SOLN
INTRAVENOUS | Status: DC | PRN
  Administered 2012-07-16: 500 mL

## 2012-07-16 MED ORDER — FENTANYL CITRATE 0.05 MG/ML IJ SOLN
INTRAMUSCULAR | Status: DC | PRN
  Administered 2012-07-16: 50 ug via INTRAVENOUS

## 2012-07-16 MED ORDER — SODIUM CHLORIDE 0.9 % IR SOLN
Status: DC | PRN
  Administered 2012-07-16: 1000 mL
  Administered 2012-07-16: 3000 mL

## 2012-07-16 MED ORDER — ONDANSETRON HCL 4 MG/2ML IJ SOLN
INTRAMUSCULAR | Status: DC | PRN
  Administered 2012-07-16: 4 mg via INTRAVENOUS

## 2012-07-16 MED ORDER — LACTATED RINGERS IV SOLN
INTRAVENOUS | Status: DC | PRN
  Administered 2012-07-16 (×2): via INTRAVENOUS

## 2012-07-16 MED ORDER — LACTATED RINGERS IV SOLN
INTRAVENOUS | Status: DC
  Administered 2012-07-16: 10:00:00 via INTRAVENOUS

## 2012-07-16 MED ORDER — BUPIVACAINE-EPINEPHRINE 0.5% -1:200000 IJ SOLN
INTRAMUSCULAR | Status: DC | PRN
  Administered 2012-07-16: 20 mL

## 2012-07-16 MED ORDER — MIDAZOLAM HCL 2 MG/2ML IJ SOLN
INTRAMUSCULAR | Status: AC
  Filled 2012-07-16: qty 2

## 2012-07-16 MED ORDER — MIDAZOLAM HCL 2 MG/2ML IJ SOLN
1.0000 mg | INTRAMUSCULAR | Status: AC | PRN
  Administered 2012-07-16 (×3): 2 mg via INTRAVENOUS

## 2012-07-16 MED ORDER — PROPOFOL 10 MG/ML IV EMUL
INTRAVENOUS | Status: DC | PRN
  Administered 2012-07-16: 200 mg via INTRAVENOUS

## 2012-07-16 MED ORDER — KETOROLAC TROMETHAMINE 10 MG PO TABS: 10.0000 mg | ORAL_TABLET | Freq: Four times a day (QID) | ORAL | Status: AC | PRN

## 2012-07-16 MED ORDER — CEFAZOLIN SODIUM-DEXTROSE 2-3 GM-% IV SOLR
INTRAVENOUS | Status: AC
  Filled 2012-07-16: qty 50

## 2012-07-16 MED ORDER — CEFAZOLIN SODIUM 1-5 GM-% IV SOLN
INTRAVENOUS | Status: DC | PRN
  Administered 2012-07-16: 2 g via INTRAVENOUS

## 2012-07-16 MED ORDER — BUPIVACAINE-EPINEPHRINE PF 0.5-1:200000 % IJ SOLN
INTRAMUSCULAR | Status: AC
  Filled 2012-07-16: qty 10

## 2012-07-16 MED ORDER — PROPOFOL 10 MG/ML IV EMUL
INTRAVENOUS | Status: AC
  Filled 2012-07-16: qty 20

## 2012-07-16 MED ORDER — FENTANYL CITRATE 0.05 MG/ML IJ SOLN
INTRAMUSCULAR | Status: AC
  Filled 2012-07-16: qty 2

## 2012-07-16 MED ORDER — FENTANYL CITRATE 0.05 MG/ML IJ SOLN
INTRAMUSCULAR | Status: AC
  Filled 2012-07-16: qty 5

## 2012-07-16 MED ORDER — ONDANSETRON HCL 4 MG/2ML IJ SOLN: 4.0000 mg | Freq: Once | INTRAMUSCULAR | Status: DC | PRN

## 2012-07-16 MED ORDER — OXYCODONE-ACETAMINOPHEN 5-325 MG PO TABS: 1.0000 | ORAL_TABLET | ORAL | Status: AC | PRN

## 2012-07-16 SURGICAL SUPPLY — 28 items
BAG DECANTER FOR FLEXI CONT (MISCELLANEOUS) ×2 IMPLANT
BAG HAMPER (MISCELLANEOUS) ×2 IMPLANT
CATH ROBINSON RED A/P 16FR (CATHETERS) IMPLANT
CATH THERMACHOICE III (CATHETERS) ×2 IMPLANT
CLOTH BEACON ORANGE TIMEOUT ST (SAFETY) ×2 IMPLANT
COVER LIGHT HANDLE STERIS (MISCELLANEOUS) ×4 IMPLANT
DECANTER SPIKE VIAL GLASS SM (MISCELLANEOUS) ×1 IMPLANT
FORMALIN 10 PREFIL 480ML (MISCELLANEOUS) ×1 IMPLANT
GLOVE BIOGEL PI IND STRL 7.0 (GLOVE) ×1 IMPLANT
GLOVE BIOGEL PI INDICATOR 7.0 (GLOVE) ×1
GLOVE ECLIPSE 9.0 STRL (GLOVE) ×2 IMPLANT
GLOVE EXAM NITRILE MD LF STRL (GLOVE) ×1 IMPLANT
GLOVE INDICATOR STER SZ 9 (GLOVE) ×2 IMPLANT
GLOVE OPTIFIT SS 6.5 STRL BRWN (GLOVE) ×2 IMPLANT
GOWN STRL REIN 3XL LVL4 (GOWN DISPOSABLE) ×2 IMPLANT
GOWN STRL REIN XL XLG (GOWN DISPOSABLE) ×3 IMPLANT
INST SET HYSTEROSCOPY (KITS) ×2 IMPLANT
IV D5W 500ML (IV SOLUTION) ×2 IMPLANT
IV NS IRRIG 3000ML ARTHROMATIC (IV SOLUTION) ×2 IMPLANT
KIT ROOM TURNOVER AP CYSTO (KITS) ×2 IMPLANT
MANIFOLD NEPTUNE II (INSTRUMENTS) ×2 IMPLANT
NS IRRIG 1000ML POUR BTL (IV SOLUTION) ×2 IMPLANT
PACK PERI GYN (CUSTOM PROCEDURE TRAY) ×2 IMPLANT
PAD ARMBOARD 7.5X6 YLW CONV (MISCELLANEOUS) ×2 IMPLANT
PAD TELFA 3X4 1S STER (GAUZE/BANDAGES/DRESSINGS) ×2 IMPLANT
SET BASIN LINEN APH (SET/KITS/TRAYS/PACK) ×2 IMPLANT
SET IRRIG Y TYPE TUR BLADDER L (SET/KITS/TRAYS/PACK) ×2 IMPLANT
SYR CONTROL 10ML LL (SYRINGE) ×2 IMPLANT

## 2012-07-16 NOTE — Preoperative (Signed)
Beta Blockers   Reason not to administer Beta Blockers:Not Applicable 

## 2012-07-16 NOTE — Interval H&P Note (Signed)
History and Physical Interval Note:  07/16/2012 10:28 AM  Amanda Rich  has presented today for surgery, with the diagnosis of menometrorrhagia  The various methods of treatment have been discussed with the patient and family. After consideration of risks, benefits and other options for treatment, the patient has consented to  Procedure(s) (LRB): DILATATION & CURETTAGE/HYSTEROSCOPY WITH THERMACHOICE ABLATION (N/A) as a surgical intervention .  The patient's history has been reviewed, patient examined, no change in status, stable for surgery.  I have reviewed the patient's chart and labs.  Questions were answered to the patient's satisfaction.    Patient's period was last week. [name]

## 2012-07-16 NOTE — Anesthesia Procedure Notes (Signed)
Procedure Name: LMA Insertion Date/Time: 07/16/2012 10:41 AM Performed by: [place] Score A Pre-anesthesia Checklist: Patient identified, Emergency Drugs available, Suction available and Patient being monitored Patient Re-evaluated:Patient Re-evaluated prior to inductionOxygen Delivery Method: Circle system utilized Preoxygenation: Pre-oxygenation with 100% oxygen Intubation Type: IV induction LMA: LMA inserted LMA Size: 4.0 Number of attempts: 1 Placement Confirmation: positive ETCO2 and breath sounds checked- equal and bilateral Tube secured with: Tape Dental Injury: Teeth and Oropharynx as per pre-operative assessment

## 2012-07-16 NOTE — Anesthesia Postprocedure Evaluation (Signed)
  Anesthesia Post-op Note  Patient: Amanda Rich  Procedure(s) Performed: Procedure(s) (LRB): DILATATION & CURETTAGE/HYSTEROSCOPY WITH THERMACHOICE ABLATION (N/A)  Patient Location: PACU  Anesthesia Type: General  Level of Consciousness: awake, alert  and patient cooperative  Airway and Oxygen Therapy: Patient Spontanous Breathing and Patient connected to nasal cannula oxygen  Post-op Pain: none  Post-op Assessment: Post-op Vital signs reviewed, Patient's Cardiovascular Status Stable and Respiratory Function Stable  Post-op Vital Signs: Reviewed and stable  Complications: No apparent anesthesia complications

## 2012-07-16 NOTE — Op Note (Signed)
See operative details included in the brief operative note 

## 2012-07-16 NOTE — Transfer of Care (Signed)
Immediate Anesthesia Transfer of Care Note  Patient: Amanda Rich  Procedure(s) Performed: Procedure(s) (LRB): DILATATION & CURETTAGE/HYSTEROSCOPY WITH THERMACHOICE ABLATION (N/A)  Patient Location: PACU  Anesthesia Type: General  Level of Consciousness: awake, alert  and patient cooperative  Airway & Oxygen Therapy: Patient Spontanous Breathing and Patient connected to nasal cannula oxygen  Post-op Assessment: Report given to PACU RN  Post vital signs: Reviewed and stable  Complications: No apparent anesthesia complications

## 2012-07-16 NOTE — Anesthesia Preprocedure Evaluation (Addendum)
Anesthesia Evaluation  Patient identified by MRN, date of birth, ID band Patient awake    Reviewed: Allergy & Precautions, H&P , NPO status , Patient's Chart, lab work & pertinent test results  History of Anesthesia Complications Negative for: history of anesthetic complications  Airway Mallampati: I      Dental  (+) Edentulous Upper   Pulmonary Current Smoker, PE: am cough. breath sounds clear to auscultation        Cardiovascular negative cardio ROS  Rhythm:Regular     Neuro/Psych    GI/Hepatic negative GI ROS,   Endo/Other    Renal/GU      Musculoskeletal   Abdominal   Peds  Hematology   Anesthesia Other Findings   Reproductive/Obstetrics                           Anesthesia Physical Anesthesia Plan  ASA: II  Anesthesia Plan: General   Post-op Pain Management:    Induction: Intravenous  Airway Management Planned: LMA  Additional Equipment:   Intra-op Plan:   Post-operative Plan:   Informed Consent:   Plan Discussed with:   Anesthesia Plan Comments:         Anesthesia Quick Evaluation

## 2012-07-16 NOTE — OR Nursing (Signed)
Complaints of  Chest tightness ,  Sat 97 ,98 %   B/p   153/88,    Hr  71.  Reported to Dr. Jayme Cloud

## 2012-07-16 NOTE — Brief Op Note (Signed)
07/16/2012  11:21 AM  PATIENT:  Amanda Rich  46 y.o. female  PRE-OPERATIVE DIAGNOSIS:  menometrorrhagia  POST-OPERATIVE DIAGNOSIS:  menometrorrhagia  PROCEDURE:  Procedure(s) (LRB): DILATATION & CURETTAGE/HYSTEROSCOPY WITH THERMACHOICE ABLATION (N/A)  SURGEON:  Surgeon(s) and Role:    * [name], MD - Primary  PHYSICIAN ASSISTANT:   ASSISTANTS: none   ANESTHESIA:   local and general  EBL:  Total I/O In: 1000 [I.V.:1000] Out: -   BLOOD ADMINISTERED:none  DRAINS: none   LOCAL MEDICATIONS USED:  Amount: 20 ml  SPECIMEN:  No Specimen  DISPOSITION OF SPECIMEN:  N/A  COUNTS:  YES  TOURNIQUET:  * No tourniquets in log *  DICTATION: .Dragon Dictation Patient was taken operating room prepped and draped for vaginal procedure with antibiotics administered and timeout conducted by surgical team. Cervix was grasped with single-tooth tenaculum sounded in the anteflexed position to 10 cm, dilated to 23 [place] allowing introduction of a 30 rigid hysteroscope which identified both tubal ostia and a thin endometrium with some tissue that was removed by curettage. Prior biopsy proven the tissue was benign. The Gynecare ThermaChoice 3 endometrial ablation device was then prepared, inserted and the 8 minute thermal ablation sequence completed. 13 cc of D5W were used and recovered completely. Paracervical block using 20 cc of Marcaine with epinephrine was then performed and patient allowed to go recovery room in stable condition sponge and needle counts correct. PLAN OF CARE: Discharge to home after PACU  PATIENT DISPOSITION:  PACU - hemodynamically stable.   Delay start of Pharmacological VTE agent (>24hrs) due to surgical blood loss or risk of bleeding: not applicable

## 2014-01-27 ENCOUNTER — Emergency Department (HOSPITAL_COMMUNITY)
Admission: EM | Admit: 2014-01-27 | Discharge: 2014-01-27 | Disposition: A | Discharge status: Home / Self Care | Payer: BC Managed Care – PPO | Attending: Emergency Medicine | Admitting: Emergency Medicine

## 2014-01-27 ENCOUNTER — Encounter (HOSPITAL_COMMUNITY): Payer: Self-pay | Admitting: Emergency Medicine

## 2014-01-27 DIAGNOSIS — Z9851 Tubal ligation status: Secondary | ICD-10-CM | POA: Insufficient documentation

## 2014-01-27 DIAGNOSIS — Z79899 Other long term (current) drug therapy: Secondary | ICD-10-CM | POA: Insufficient documentation

## 2014-01-27 DIAGNOSIS — Z862 Personal history of diseases of the blood and blood-forming organs and certain disorders involving the immune mechanism: Secondary | ICD-10-CM | POA: Insufficient documentation

## 2014-01-27 DIAGNOSIS — N390 Urinary tract infection, site not specified: Secondary | ICD-10-CM | POA: Insufficient documentation

## 2014-01-27 DIAGNOSIS — N949 Unspecified condition associated with female genital organs and menstrual cycle: Secondary | ICD-10-CM | POA: Insufficient documentation

## 2014-01-27 DIAGNOSIS — F17200 Nicotine dependence, unspecified, uncomplicated: Secondary | ICD-10-CM | POA: Insufficient documentation

## 2014-01-27 LAB — URINALYSIS, ROUTINE W REFLEX MICROSCOPIC
Bilirubin Urine: NEGATIVE
Glucose, UA: NEGATIVE mg/dL
Ketones, ur: NEGATIVE mg/dL
Nitrite: NEGATIVE
Protein, ur: NEGATIVE mg/dL
Specific Gravity, Urine: 1.02 (ref 1.005–1.030)
Urobilinogen, UA: 2 mg/dL — ABNORMAL HIGH (ref 0.0–1.0)
pH: 7.5 (ref 5.0–8.0)

## 2014-01-27 LAB — URINE MICROSCOPIC-ADD ON

## 2014-01-27 MED ORDER — NITROFURANTOIN MONOHYD MACRO 100 MG PO CAPS: 100.0000 mg | ORAL_CAPSULE | Freq: Two times a day (BID) | ORAL | Status: DC

## 2014-01-27 MED ORDER — PHENAZOPYRIDINE HCL 200 MG PO TABS: 200.0000 mg | ORAL_TABLET | Freq: Three times a day (TID) | ORAL | Status: DC

## 2014-01-27 NOTE — ED Provider Notes (Signed)
CSN: [id number]     Arrival date & time 01/27/14  0001 History  This chart was scribed for Varney Biles, MD by Zettie Pho, ED Scribe. This patient was seen in room [facility]12[facility]12 and the patient's care was started at 12:26 AM.    Chief Complaint  Patient presents with  . Dysuria   The history is provided by the patient. No language interpreter was used.   HPI Comments: Amanda Rich is a 48 y.o. female with a history of UTIs who presents to the Emergency Department complaining of a burning dysuria with associated vaginal itching onset a few days ago that has been progressively worsening. She states that her current symptoms feel similar to her past UTIs. She reports using an OTC diaper rash cream to the area without relief. She denies hematuria, urinary frequency or urgency, abdominal pain, back pain, nausea, emesis, fever, chills, vaginal discharge or bleeding. Patient states her menstrual period was last week and was normal. She reports that she is currently sexually active with one partner and denies possible STD exposure. Patient has a history of tubal ligation and ablation on endometriosis. Patient has a history of anemia.   Past Medical History  Diagnosis Date  . Anemia    Past Surgical History  Procedure Laterality Date  . Tubal ligation      [facility]  . Umbilical hernia repair  2005    [facility]  . Ganglion cyst excision  2007    L wrist; [facility], [place]   No family history on file. History  Substance Use Topics  . Smoking status: Current Every Day Smoker -- 0.50 packs/day for 25 years    Types: Cigarettes  . Smokeless tobacco: Not on file  . Alcohol Use: 0.6 oz/week    1 Glasses of wine per week   OB History   Grav Para Term Preterm Abortions TAB SAB Ect Mult Living                 Review of Systems  Constitutional: Negative for fever and chills.  Gastrointestinal: Negative for nausea, vomiting and abdominal pain.  Genitourinary: Positive for dysuria and vaginal pain. Negative  for urgency, frequency, hematuria, vaginal bleeding and vaginal discharge.  Musculoskeletal: Negative for back pain.    Allergies  Review of patient's allergies indicates no known allergies.  Home Medications   Current Outpatient Rx  Name  Route  Sig  Dispense  Refill  . Multiple Vitamin (MULTIVITAMIN WITH MINERALS) TABS   Oral   Take 1 tablet by mouth daily.         . nicotine (NICODERM CQ - DOSED IN MG/24 HOURS) 21 mg/24hr patch   Transdermal   Place 1 patch onto the skin daily.          Triage Vitals: BP 137/86  Pulse 80  Temp(Src) 97.8 F (36.6 C) (Oral)  Resp 18  Ht 5\' 5"  (1.651 m)  Wt 123 lb (55.792 kg)  BMI 20.47 kg/m2  SpO2 96%  LMP 01/24/2014  Physical Exam  Nursing note and vitals reviewed. Constitutional: She is oriented to person, place, and time. She appears well-developed and well-nourished. No distress.  HENT:  Head: Normocephalic and atraumatic.  Eyes: Conjunctivae are normal.  Neck: Normal range of motion. Neck supple.  Cardiovascular: Normal rate, regular rhythm and normal heart sounds.   Pulmonary/Chest: Effort normal and breath sounds normal. No respiratory distress.  Anterior auscultation of lungs is clear bilaterally.   Abdominal: Soft. Bowel sounds are normal. She exhibits  no distension. There is no tenderness.  Musculoskeletal: Normal range of motion.  Neurological: She is alert and oriented to person, place, and time.  Skin: Skin is warm and dry.  Psychiatric: She has a normal mood and affect. Her behavior is normal.    ED Course  Procedures (including critical care time)  DIAGNOSTIC STUDIES: Oxygen Saturation is 96% on room air, normal by my interpretation.    COORDINATION OF CARE: 12:29 PM- Will order UA. Discussed treatment plan with patient at bedside and patient verbalized agreement.     Labs Review Labs Reviewed - No data to display Imaging Review No results found.  EKG Interpretation   None       MDM  No  diagnosis found.  I personally performed the services described in this documentation, which was scribed in my presence. The recorded information has been reviewed and is accurate.  Pt with hx of UTI comes in with dysuria. She has no vaginal d/c, bleeding - and no pelvic tenderness on exam. [place] is unremarkable - but we will tx with macrobid as suspected UTI given the sx. She is immunocompetent. Repeat exam again is normal for the abd, and she has no std risk factors. PCp f/u requested if not better.   Varney Biles, MD 01/27/14 [phone]

## 2014-01-27 NOTE — Discharge Instructions (Signed)
Urinary Tract Infection  Urinary tract infections (UTIs) can develop anywhere along your urinary tract. Your urinary tract is your body's drainage system for removing wastes and extra water. Your urinary tract includes two kidneys, two ureters, a bladder, and a urethra. Your kidneys are a pair of bean-shaped organs. Each kidney is about the size of your fist. They are located below your ribs, one on each side of your spine.  CAUSES  Infections are caused by microbes, which are microscopic organisms, including fungi, viruses, and bacteria. These organisms are so small that they can only be seen through a microscope. Bacteria are the microbes that most commonly cause UTIs.  SYMPTOMS   Symptoms of UTIs may vary by age and gender of the patient and by the location of the infection. Symptoms in young women typically include a frequent and intense urge to urinate and a painful, burning feeling in the bladder or urethra during urination. Older women and men are more likely to be tired, shaky, and weak and have muscle aches and abdominal pain. A fever may mean the infection is in your kidneys. Other symptoms of a kidney infection include pain in your back or sides below the ribs, nausea, and vomiting.  DIAGNOSIS  To diagnose a UTI, your caregiver will ask you about your symptoms. Your caregiver also will ask to provide a urine sample. The urine sample will be tested for bacteria and white blood cells. White blood cells are made by your body to help fight infection.  TREATMENT   Typically, UTIs can be treated with medication. Because most UTIs are caused by a bacterial infection, they usually can be treated with the use of antibiotics. The choice of antibiotic and length of treatment depend on your symptoms and the type of bacteria causing your infection.  HOME CARE INSTRUCTIONS   If you were prescribed antibiotics, take them exactly as your caregiver instructs you. Finish the medication even if you feel better after you  have only taken some of the medication.   Drink enough water and fluids to keep your urine clear or pale yellow.   Avoid caffeine, tea, and carbonated beverages. They tend to irritate your bladder.   Empty your bladder often. Avoid holding urine for long periods of time.   Empty your bladder before and after sexual intercourse.   After a bowel movement, women should cleanse from front to back. Use each tissue only once.  SEEK MEDICAL CARE IF:    You have back pain.   You develop a fever.   Your symptoms do not begin to resolve within 3 days.  SEEK IMMEDIATE MEDICAL CARE IF:    You have severe back pain or lower abdominal pain.   You develop chills.   You have nausea or vomiting.   You have continued burning or discomfort with urination.  MAKE SURE YOU:    Understand these instructions.   Will watch your condition.   Will get help right away if you are not doing well or get worse.  Document Released: 09/20/2005 Document Revised: 06/11/2012 Document Reviewed: 01/19/2012  [employer] Patient Information 2014 [employer]

## 2014-01-27 NOTE — ED Notes (Signed)
Pt c/o vaginal irritation and dysuria.

## 2014-01-28 LAB — URINE CULTURE
Colony Count: NO GROWTH
Culture: NO GROWTH

## 2014-12-21 LAB — HM PAP SMEAR: HM Pap smear: NEGATIVE

## 2015-04-28 ENCOUNTER — Other Ambulatory Visit: Payer: Self-pay

## 2015-04-28 DIAGNOSIS — Z1231 Encounter for screening mammogram for malignant neoplasm of breast: Secondary | ICD-10-CM

## 2015-04-30 ENCOUNTER — Ambulatory Visit: Payer: Self-pay

## 2015-05-25 ENCOUNTER — Emergency Department (HOSPITAL_COMMUNITY)
Admission: EM | Admit: 2015-05-25 | Discharge: 2015-05-25 | Disposition: A | Discharge status: Home / Self Care | Payer: BLUE CROSS/BLUE SHIELD | Attending: Emergency Medicine | Admitting: Emergency Medicine

## 2015-05-25 ENCOUNTER — Emergency Department (HOSPITAL_COMMUNITY): Payer: BLUE CROSS/BLUE SHIELD

## 2015-05-25 ENCOUNTER — Encounter (HOSPITAL_COMMUNITY): Payer: Self-pay | Admitting: Emergency Medicine

## 2015-05-25 DIAGNOSIS — Z862 Personal history of diseases of the blood and blood-forming organs and certain disorders involving the immune mechanism: Secondary | ICD-10-CM | POA: Insufficient documentation

## 2015-05-25 DIAGNOSIS — K654 Sclerosing mesenteritis: Secondary | ICD-10-CM

## 2015-05-25 DIAGNOSIS — R1033 Periumbilical pain: Secondary | ICD-10-CM | POA: Diagnosis present

## 2015-05-25 DIAGNOSIS — N9489 Other specified conditions associated with female genital organs and menstrual cycle: Secondary | ICD-10-CM

## 2015-05-25 DIAGNOSIS — E876 Hypokalemia: Secondary | ICD-10-CM | POA: Insufficient documentation

## 2015-05-25 DIAGNOSIS — Z720 Tobacco use: Secondary | ICD-10-CM | POA: Insufficient documentation

## 2015-05-25 DIAGNOSIS — Z3202 Encounter for pregnancy test, result negative: Secondary | ICD-10-CM | POA: Insufficient documentation

## 2015-05-25 LAB — CBC WITH DIFFERENTIAL/PLATELET
Basophils Absolute: 0 10*3/uL (ref 0.0–0.1)
Basophils Relative: 1 % (ref 0–1)
Eosinophils Absolute: 0.2 10*3/uL (ref 0.0–0.7)
Eosinophils Relative: 4 % (ref 0–5)
HCT: 39.6 % (ref 36.0–46.0)
Hemoglobin: 13.2 g/dL (ref 12.0–15.0)
Lymphocytes Relative: 46 % (ref 12–46)
Lymphs Abs: 2.5 10*3/uL (ref 0.7–4.0)
MCH: 28.9 pg (ref 26.0–34.0)
MCHC: 33.3 g/dL (ref 30.0–36.0)
MCV: 86.7 fL (ref 78.0–100.0)
Monocytes Absolute: 0.4 10*3/uL (ref 0.1–1.0)
Monocytes Relative: 7 % (ref 3–12)
Neutro Abs: 2.3 10*3/uL (ref 1.7–7.7)
Neutrophils Relative %: 42 % — ABNORMAL LOW (ref 43–77)
Platelets: 251 10*3/uL (ref 150–400)
RBC: 4.57 MIL/uL (ref 3.87–5.11)
RDW: 13.1 % (ref 11.5–15.5)
WBC: 5.5 10*3/uL (ref 4.0–10.5)

## 2015-05-25 LAB — URINE MICROSCOPIC-ADD ON

## 2015-05-25 LAB — COMPREHENSIVE METABOLIC PANEL
ALT: 13 U/L — ABNORMAL LOW (ref 14–54)
AST: 21 U/L (ref 15–41)
Albumin: 4.2 g/dL (ref 3.5–5.0)
Alkaline Phosphatase: 38 U/L (ref 38–126)
Anion gap: 9 (ref 5–15)
BUN: 9 mg/dL (ref 6–20)
CO2: 27 mmol/L (ref 22–32)
Calcium: 9.5 mg/dL (ref 8.9–10.3)
Chloride: 106 mmol/L (ref 101–111)
Creatinine, Ser: 0.67 mg/dL (ref 0.44–1.00)
GFR calc Af Amer: >60 mL/min (ref >60)
GFR calc non Af Amer: >60 mL/min (ref >60)
Glucose, Bld: 94 mg/dL (ref 65–99)
Potassium: 3.4 mmol/L — ABNORMAL LOW (ref 3.5–5.1)
Sodium: 142 mmol/L (ref 135–145)
Total Bilirubin: 0.4 mg/dL (ref 0.3–1.2)
Total Protein: 7 g/dL (ref 6.5–8.1)

## 2015-05-25 LAB — URINALYSIS, ROUTINE W REFLEX MICROSCOPIC
Bilirubin Urine: NEGATIVE
Glucose, UA: NEGATIVE mg/dL
Ketones, ur: NEGATIVE mg/dL
Leukocytes, UA: NEGATIVE
Nitrite: NEGATIVE
Protein, ur: NEGATIVE mg/dL
Specific Gravity, Urine: 1.023 (ref 1.005–1.030)
Urobilinogen, UA: 1 mg/dL (ref 0.0–1.0)
pH: 6 (ref 5.0–8.0)

## 2015-05-25 LAB — POC URINE PREG, ED: Preg Test, Ur: NEGATIVE

## 2015-05-25 LAB — LIPASE, BLOOD: Lipase: 27 U/L (ref 22–51)

## 2015-05-25 MED ORDER — HYDROCODONE-ACETAMINOPHEN 5-325 MG PO TABS: 1.0000 | ORAL_TABLET | Freq: Four times a day (QID) | ORAL | Status: DC | PRN

## 2015-05-25 MED ORDER — ONDANSETRON HCL 4 MG/2ML IJ SOLN
4.0000 mg | Freq: Once | INTRAMUSCULAR | Status: AC
  Administered 2015-05-25: 4 mg via INTRAVENOUS
  Filled 2015-05-25: qty 2

## 2015-05-25 MED ORDER — HYDROMORPHONE HCL 1 MG/ML IJ SOLN
0.5000 mg | Freq: Once | INTRAMUSCULAR | Status: AC
  Administered 2015-05-25: 0.5 mg via INTRAVENOUS
  Filled 2015-05-25: qty 1

## 2015-05-25 MED ORDER — IOHEXOL 300 MG/ML  SOLN
100.0000 mL | Freq: Once | INTRAMUSCULAR | Status: AC | PRN
  Administered 2015-05-25: 100 mL via INTRAVENOUS

## 2015-05-25 MED ORDER — IOHEXOL 300 MG/ML  SOLN: 25.0000 mL | Freq: Once | INTRAMUSCULAR | Status: DC | PRN

## 2015-05-25 NOTE — Discharge Instructions (Signed)

## 2015-05-25 NOTE — ED Notes (Signed)
Pt. reports mid abdominal pain with  and low back pain onset this evening , denies fever or diarrhea.

## 2015-05-25 NOTE — ED Provider Notes (Signed)
CSN: [id number]     Arrival date & time 05/25/15  0019 History  This chart was scribed for Amanda Patches, MD by Randa Evens, ED Scribe. This patient was seen in room A07C/A07C and the patient's care was started at 1:30 AM.     Chief Complaint  Patient presents with  . Abdominal Pain   Patient is a 49 y.o. female presenting with abdominal pain. The history is provided by the patient. No language interpreter was used.  Abdominal Pain Pain location:  Periumbilical Pain quality: sharp   Pain radiates to:  LUQ Pain severity:  Moderate Onset quality:  Sudden Duration:  3 hours Timing:  Constant Progression:  Unchanged Chronicity:  New Relieved by:  None tried Worsened by:  Nothing tried Ineffective treatments:  None tried Associated symptoms: nausea and vomiting   Associated symptoms: no chest pain, no chills, no cough, no diarrhea, no dysuria, no fatigue, no fever, no shortness of breath and no sore throat    HPI Comments: Amanda Rich is a 49 y.o. female who presents to the Emergency Department complaining of sudden twisting sharp mid abdominal pain onset 11:30 Pm tonight. Pt states that the pain radiates to the left side of her abdomen. Pt states she has associated nausea and vomiting. Pt reports normal BM today. Pt reports Hx of similar pain 2 days prior that resolved on its on. Pt reports Hx of umbilical hernia in 7829. Pt denes any other abdominal surgeries. Pt states that she does drink alcohol occasionally. Pt states she does take BC goody powders about 1x per day. Pt report also drinking 2 energy drinks a night due to working 3rd shift. Denies fever, chills, diarrhea, difficulty urinating, dysuria or frequency.   Past Medical History  Diagnosis Date  . Anemia    Past Surgical History  Procedure Laterality Date  . Tubal ligation      [facility]  . Umbilical hernia repair  2005    [place]  . Ganglion cyst excision  2007    L wrist; [facility], [place]  . Ablation on endometriosis      No family history on file. History  Substance Use Topics  . Smoking status: Current Every Day Smoker -- 0.50 packs/day for 25 years    Types: Cigarettes  . Smokeless tobacco: Not on file  . Alcohol Use: 0.6 oz/week    1 Glasses of wine per week   OB History    No data available      Review of Systems  Constitutional: Negative for fever, chills, diaphoresis, activity change, appetite change and fatigue.  HENT: Negative for congestion, facial swelling, rhinorrhea and sore throat.   Eyes: Negative for photophobia and discharge.  Respiratory: Negative for cough, chest tightness and shortness of breath.   Cardiovascular: Negative for chest pain, palpitations and leg swelling.  Gastrointestinal: Positive for nausea, vomiting and abdominal pain. Negative for diarrhea.  Endocrine: Negative for polydipsia and polyuria.  Genitourinary: Negative for dysuria, frequency, difficulty urinating and pelvic pain.  Musculoskeletal: Negative for back pain, arthralgias, neck pain and neck stiffness.  Skin: Negative for color change and wound.  Allergic/Immunologic: Negative for immunocompromised state.  Neurological: Negative for facial asymmetry, weakness, numbness and headaches.  Hematological: Does not bruise/bleed easily.  Psychiatric/Behavioral: Negative for confusion and agitation.      Allergies  Review of patient's allergies indicates no known allergies.  Home Medications   Prior to Admission medications   Medication Sig Start Date End Date Taking? Authorizing Provider  HYDROcodone-acetaminophen (  NORCO) 5-325 MG per tablet Take 1 tablet by mouth every 6 (six) hours as needed. 05/25/15   Amanda Patches, MD  Multiple Vitamin (MULTIVITAMIN WITH MINERALS) TABS Take 1 tablet by mouth daily.    Historical Provider, MD   BP 105/71 mmHg  Pulse 64  Temp(Src) 98.1 F (36.7 C) (Oral)  Resp 12  Ht 5\' 5"  (1.651 m)  Wt 123 lb (55.792 kg)  BMI 20.47 kg/m2  SpO2 99%  LMP 05/24/2015    Physical Exam  Constitutional: She is oriented to person, place, and time. She appears well-developed and well-nourished. No distress.  HENT:  Head: Normocephalic.  Mouth/Throat: Oropharynx is clear and moist.  Eyes: Pupils are equal, round, and reactive to light.  Neck: Neck supple.  Cardiovascular: Normal rate, regular rhythm and normal heart sounds.   Pulmonary/Chest: Effort normal and breath sounds normal. No respiratory distress. She has no wheezes.  Abdominal: Soft. She exhibits distension. Bowel sounds are decreased. There is tenderness in the periumbilical area and left upper quadrant. There is no rigidity, no rebound and no guarding.  Musculoskeletal: She exhibits no edema or tenderness.  Neurological: She is alert and oriented to person, place, and time.  Skin: Skin is warm and dry.  Psychiatric: She has a normal mood and affect.  Nursing note and vitals reviewed.   ED Course  Procedures (including critical care time) DIAGNOSTIC STUDIES: Oxygen Saturation is 99% on RA, normal by my interpretation.    COORDINATION OF CARE: 1:57 AM-Discussed treatment plan with pt at bedside and pt agreed to plan.     Labs Review Labs Reviewed  CBC WITH DIFFERENTIAL/PLATELET - Abnormal; Notable for the following:    Neutrophils Relative % 42 (*)    All other components within normal limits  COMPREHENSIVE METABOLIC PANEL - Abnormal; Notable for the following:    Potassium 3.4 (*)    ALT 13 (*)    All other components within normal limits  URINALYSIS, ROUTINE W REFLEX MICROSCOPIC (NOT AT [facility]) - Abnormal; Notable for the following:    APPearance CLOUDY (*)    Hgb urine dipstick LARGE (*)    All other components within normal limits  URINE MICROSCOPIC-ADD ON - Abnormal; Notable for the following:    Squamous Epithelial / LPF FEW (*)    Bacteria, UA FEW (*)    All other components within normal limits  LIPASE, BLOOD  POC URINE PREG, ED    Imaging Review Ct Abdomen Pelvis W  Contrast  05/25/2015   CLINICAL DATA:  Mid abdominal pain, low back pain since this evening.  EXAM: CT ABDOMEN AND PELVIS WITH CONTRAST  TECHNIQUE: Multidetector CT imaging of the abdomen and pelvis was performed using the standard protocol following bolus administration of intravenous contrast.  CONTRAST:  128mL OMNIPAQUE IOHEXOL 300 MG/ML  SOLN  COMPARISON:  Radiographs 05/07/2011  FINDINGS: Minimal atelectasis at the right greater than left lung base.  The liver, gallbladder, spleen, pancreas, and adrenal glands are normal. The kidneys demonstrate symmetric enhancement without hydronephrosis or localizing abnormality.  Stomach is distended with ingested contrast. There are no dilated or thickened bowel loops. There is a moderate volume of colonic stool with mild gaseous distention of transverse colon. Descending and sigmoid colon are decompressed. There is mild soft tissue stranding in the left upper quadrant of the abdomen, etiology uncertain. No free air, free fluid, or intra-abdominal fluid collection.  No retroperitoneal adenopathy. Abdominal aorta is normal in caliber.  Within the pelvis urinary bladder is physiologically distended.  Uterus is slightly bulbous. There is prominent periuterine and adnexal vascularity. No evidence of adnexal mass. Borderline-mild dilatation of the left ovarian vein. Small amount of free fluid in the cul-de-sac.  There are no acute or suspicious osseous abnormalities. There is cystic change in the right acetabulum.  IMPRESSION: 1. Minimal soft tissue stranding in the left upper quadrant of the abdomen. The etiology is uncertain, as there is no associated bowel wall thickening of the adjacent stomach, small bowel or colon. Fat necrosis could have this appearance. 2. Prominent periuterine vascularity and borderline dilatation of the left gonadal vein can be seen in the setting of pelvic congestion syndrome.   Electronically Signed   By: Jeb Levering M.D.   On: 05/25/2015 04:58      EKG Interpretation None      MDM   Final diagnoses:  Fat necrosis of mesentery  Pelvic congestion syndrome   Pt is a 49 y.o. female with Pmhx as above who presents with periumbilical abdominal pain starting this evening with associated nausea but no vomiting.  On physical exam, patient is tender in the left upper quadrant without rebound or guarding.  Point-of-care pregnancy is negative.  CBC and CMP are grossly unremarkable except for mildly low potassium.  Lipase is normal.  CT abdomen pelvis ordered which shows area of fat necrosis in the left upper quadrant and also findings consistent with pelvic congestion syndrome.  Patient is feeling improved after 1 dose of pain medicine as tolerated liquids.  She'll be discharged home.  Short course of narcotics for pain and asked to follow-up with her PCP.     Arlyn Dunning evaluation in the Emergency Department is complete. It has been determined that no acute conditions requiring further emergency intervention are present at this time. The patient/guardian have been advised of the diagnosis and plan. We have discussed signs and symptoms that warrant return to the ED, such as changes or worsening in symptoms, worsening pain, fever, inability to tolerate liquids     I personally performed the services described in this documentation, which was scribed in my presence. The recorded information has been reviewed and is accurate.       Amanda Patches, MD 05/25/15 [phone]

## 2015-05-25 NOTE — ED Notes (Signed)
CT notified that the pt has finished her contrast.  

## 2015-10-18 ENCOUNTER — Ambulatory Visit
Admission: RE | Admit: 2015-10-18 | Discharge: 2015-10-18 | Disposition: A | Discharge status: Home / Self Care | Payer: BLUE CROSS/BLUE SHIELD | Source: Ambulatory Visit

## 2015-10-18 DIAGNOSIS — Z1231 Encounter for screening mammogram for malignant neoplasm of breast: Secondary | ICD-10-CM

## 2015-10-18 LAB — HM MAMMOGRAPHY

## 2016-01-13 ENCOUNTER — Other Ambulatory Visit: Payer: Self-pay | Admitting: Gastroenterology

## 2016-01-13 DIAGNOSIS — R1033 Periumbilical pain: Secondary | ICD-10-CM

## 2016-01-28 ENCOUNTER — Encounter (HOSPITAL_COMMUNITY)
Admission: RE | Admit: 2016-01-28 | Discharge: 2016-01-28 | Disposition: A | Discharge status: Home / Self Care | Payer: BLUE CROSS/BLUE SHIELD | Source: Ambulatory Visit | Attending: Gastroenterology | Admitting: Gastroenterology

## 2016-01-28 ENCOUNTER — Ambulatory Visit (HOSPITAL_COMMUNITY)
Admission: RE | Admit: 2016-01-28 | Discharge: 2016-01-28 | Disposition: A | Discharge status: Home / Self Care | Payer: BLUE CROSS/BLUE SHIELD | Source: Ambulatory Visit | Attending: Gastroenterology | Admitting: Gastroenterology

## 2016-01-28 DIAGNOSIS — R1033 Periumbilical pain: Secondary | ICD-10-CM | POA: Insufficient documentation

## 2016-01-28 MED ORDER — TECHNETIUM TC 99M MEBROFENIN IV KIT
5.0000 | PACK | Freq: Once | INTRAVENOUS | Status: AC | PRN
  Administered 2016-01-28: 5 via INTRAVENOUS

## 2016-02-24 ENCOUNTER — Telehealth: Payer: Self-pay | Admitting: Family Medicine

## 2016-02-24 NOTE — Telephone Encounter (Signed)
Called and left message that pt needs to reschedule  Appt.

## 2016-04-11 ENCOUNTER — Institutional Professional Consult (permissible substitution): Payer: Self-pay | Admitting: Family Medicine

## 2016-04-12 ENCOUNTER — Institutional Professional Consult (permissible substitution): Payer: Self-pay | Admitting: Family Medicine

## 2016-04-20 ENCOUNTER — Institutional Professional Consult (permissible substitution): Payer: Self-pay | Admitting: Family Medicine

## 2016-05-26 ENCOUNTER — Encounter: Payer: Self-pay | Admitting: Family Medicine

## 2016-06-28 ENCOUNTER — Institutional Professional Consult (permissible substitution): Payer: Self-pay | Admitting: Family Medicine

## 2016-06-29 ENCOUNTER — Encounter: Payer: Self-pay | Admitting: Family Medicine

## 2016-06-29 ENCOUNTER — Ambulatory Visit (INDEPENDENT_AMBULATORY_CARE_PROVIDER_SITE_OTHER): Payer: BLUE CROSS/BLUE SHIELD | Admitting: Family Medicine

## 2016-06-29 VITALS — BP 122/72 | HR 64 | Ht 65.0 in | Wt 118.2 lb

## 2016-06-29 DIAGNOSIS — IMO0002 Reserved for concepts with insufficient information to code with codable children: Secondary | ICD-10-CM

## 2016-06-29 DIAGNOSIS — R627 Adult failure to thrive: Secondary | ICD-10-CM

## 2016-06-29 DIAGNOSIS — F17200 Nicotine dependence, unspecified, uncomplicated: Secondary | ICD-10-CM

## 2016-06-29 DIAGNOSIS — Z720 Tobacco use: Secondary | ICD-10-CM

## 2016-06-29 DIAGNOSIS — Z7189 Other specified counseling: Secondary | ICD-10-CM

## 2016-06-29 DIAGNOSIS — Z7689 Persons encountering health services in other specified circumstances: Secondary | ICD-10-CM

## 2016-06-29 NOTE — Progress Notes (Signed)
Subjective:    Patient ID: Amanda Rich, female    DOB: 15-Sep-1966, 50 y.o.   MRN: [id number]  HPI Chief Complaint  Patient presents with  . new pt    new pt get established. no other concerns   She is new to the practice and here to establish care. States she has always been thin since high school and would like to gain weight.  Weighs 118 today and goal weight is 135.  Previous medical care: [facility] for past 2 years.  Last CPE: 2 years ago  Other providers: Dr. Collene Mares -GI. Dr. Luna [facility]. Has [facility].   Past medical history: had a hidascan and it showed gallbladder functioning at 45%. Dr. Collene Mares has been following her for GI issues.  Mild depression- has taken Celexa in past and was switched to mirtazapine and that made her too sleepy.  Mother passed in March 2015 from [place].     Surgeries: umbilical hernia repair, tubal ligation, endometrial ablation.   Family history: mother with Alzheimers. Had diabetes and neuropathy. Maternal aunt with breast cancer. Uncle with colon cancer. Daughter with Mental Health History: mild depression after mother passed.   Social history:  works as an Agricultural consultant at [employer].  Smoking but trying to quit- called 1-800-quit-now and trying patches, has 1 cigarette every 4 hours. drinks alcohol- on weekends, denies drug use  Diet: 1 large meal per day. Some snacks. Yogurt.  Excerise: outdoor activities. Has not exercised since March.  Health maintenance:  Mammogram: October 2016 benign mass with biopsy Colonoscopy: never Last Pap smear: December 2015 Last Menstrual cycle: every 20 days she has a 2-3 day light period.   Reviewed allergies, medications, past medical, surgical, family, and social history.    Review of Systems Pertinent positives and negatives in the history of present illness.     Objective:   Physical Exam  Constitutional: She is oriented to person, place, and time. She appears  well-developed and well-nourished. No distress.  Neurological: She is alert and oriented to person, place, and time.  Skin: Skin is warm and dry. No pallor.  Psychiatric: She has a normal mood and affect. Her behavior is normal. Judgment and thought content normal.   BP 122/72 mmHg  Pulse 64  Ht 5\' 5"  (1.651 m)  Wt 118 lb 3.2 oz (53.615 kg)  BMI 19.67 kg/m2  LMP 06/06/2016      Assessment & Plan:  Smoker  Failure to gain weight  Encounter to establish care  Recommend that she continue cutting back on smoking by using 1-800-quit- now and patches. Congratulated her on being motivated to quit.  Discussed that her BMI is still in the normal BMI category. She states she has been thin for years and would like to gain weight but she is not sure how many calories she is consuming daily but she is pretty sure it is not many. She brought in recent lab results from [place] that were done through her job. She has a normal CBC, CMP, TSH among other normal values.  Recommend that she use a free app and count her daily caloric intake. Discussed eating protein, not skipping meals, having healthy snacks available and drinking Ensure or Boost as she has in the past. She will follow up for CPE. She states she will have annual labs done in October through work. She will be due for a Pap smear in the next year. Will need to get medical records from previous PCP.  She signed medical release form.

## 2016-07-03 ENCOUNTER — Telehealth: Payer: Self-pay

## 2016-07-03 NOTE — Telephone Encounter (Signed)
Records rcvd from [facility] placed in your folder for review. Amanda Rich

## 2016-08-07 ENCOUNTER — Encounter: Payer: Self-pay | Admitting: Family Medicine

## 2016-08-07 DIAGNOSIS — F32A Depression, unspecified: Secondary | ICD-10-CM | POA: Insufficient documentation

## 2016-08-07 DIAGNOSIS — F329 Major depressive disorder, single episode, unspecified: Secondary | ICD-10-CM | POA: Insufficient documentation

## 2016-08-07 DIAGNOSIS — N921 Excessive and frequent menstruation with irregular cycle: Secondary | ICD-10-CM | POA: Insufficient documentation

## 2016-08-07 DIAGNOSIS — N9489 Other specified conditions associated with female genital organs and menstrual cycle: Secondary | ICD-10-CM | POA: Insufficient documentation

## 2016-08-07 DIAGNOSIS — K658 Other peritonitis: Secondary | ICD-10-CM

## 2016-08-07 DIAGNOSIS — K654 Sclerosing mesenteritis: Secondary | ICD-10-CM | POA: Insufficient documentation

## 2016-08-07 DIAGNOSIS — E782 Mixed hyperlipidemia: Secondary | ICD-10-CM | POA: Insufficient documentation

## 2016-08-08 ENCOUNTER — Encounter: Payer: Self-pay | Admitting: Family Medicine

## 2016-08-17 ENCOUNTER — Encounter: Payer: Self-pay | Admitting: Family Medicine

## 2016-09-22 ENCOUNTER — Other Ambulatory Visit: Payer: Self-pay | Admitting: Family Medicine

## 2016-09-22 ENCOUNTER — Ambulatory Visit (INDEPENDENT_AMBULATORY_CARE_PROVIDER_SITE_OTHER): Payer: BLUE CROSS/BLUE SHIELD | Admitting: Family Medicine

## 2016-09-22 ENCOUNTER — Other Ambulatory Visit (HOSPITAL_COMMUNITY)
Admission: RE | Admit: 2016-09-22 | Discharge: 2016-09-22 | Disposition: A | Discharge status: Home / Self Care | Payer: BLUE CROSS/BLUE SHIELD | Source: Ambulatory Visit | Attending: Family Medicine | Admitting: Family Medicine

## 2016-09-22 ENCOUNTER — Encounter: Payer: Self-pay | Admitting: Family Medicine

## 2016-09-22 VITALS — BP 122/70 | HR 61 | Ht 64.5 in | Wt 116.0 lb

## 2016-09-22 DIAGNOSIS — Z833 Family history of diabetes mellitus: Secondary | ICD-10-CM

## 2016-09-22 DIAGNOSIS — Z0000 Encounter for general adult medical examination without abnormal findings: Secondary | ICD-10-CM

## 2016-09-22 DIAGNOSIS — E782 Mixed hyperlipidemia: Secondary | ICD-10-CM

## 2016-09-22 DIAGNOSIS — Z124 Encounter for screening for malignant neoplasm of cervix: Secondary | ICD-10-CM

## 2016-09-22 DIAGNOSIS — Z1151 Encounter for screening for human papillomavirus (HPV): Secondary | ICD-10-CM | POA: Diagnosis not present

## 2016-09-22 DIAGNOSIS — F329 Major depressive disorder, single episode, unspecified: Secondary | ICD-10-CM

## 2016-09-22 DIAGNOSIS — Z862 Personal history of diseases of the blood and blood-forming organs and certain disorders involving the immune mechanism: Secondary | ICD-10-CM | POA: Diagnosis not present

## 2016-09-22 DIAGNOSIS — Z01419 Encounter for gynecological examination (general) (routine) without abnormal findings: Secondary | ICD-10-CM | POA: Diagnosis not present

## 2016-09-22 DIAGNOSIS — F17210 Nicotine dependence, cigarettes, uncomplicated: Secondary | ICD-10-CM | POA: Insufficient documentation

## 2016-09-22 DIAGNOSIS — Z23 Encounter for immunization: Secondary | ICD-10-CM | POA: Diagnosis not present

## 2016-09-22 DIAGNOSIS — F17200 Nicotine dependence, unspecified, uncomplicated: Secondary | ICD-10-CM | POA: Insufficient documentation

## 2016-09-22 DIAGNOSIS — Z720 Tobacco use: Secondary | ICD-10-CM | POA: Diagnosis not present

## 2016-09-22 DIAGNOSIS — F32A Depression, unspecified: Secondary | ICD-10-CM

## 2016-09-22 DIAGNOSIS — Z1231 Encounter for screening mammogram for malignant neoplasm of breast: Secondary | ICD-10-CM

## 2016-09-22 DIAGNOSIS — R5383 Other fatigue: Secondary | ICD-10-CM | POA: Diagnosis not present

## 2016-09-22 LAB — COMPLETE METABOLIC PANEL WITH GFR
ALT: 14 U/L (ref 6–29)
AST: 25 U/L (ref 10–35)
Albumin: 5 g/dL (ref 3.6–5.1)
Alkaline Phosphatase: 38 U/L (ref 33–115)
BUN: 10 mg/dL (ref 7–25)
CO2: 30 mmol/L (ref 20–31)
Calcium: 9.7 mg/dL (ref 8.6–10.2)
Chloride: 104 mmol/L (ref 98–110)
Creat: 0.64 mg/dL (ref 0.50–1.10)
GFR, Est African American: >89 mL/min (ref >=60)
GFR, Est Non African American: >89 mL/min (ref >=60)
Glucose, Bld: 92 mg/dL (ref 65–99)
Potassium: 4.2 mmol/L (ref 3.5–5.3)
Sodium: 140 mmol/L (ref 135–146)
Total Bilirubin: 0.8 mg/dL (ref 0.2–1.2)
Total Protein: 7.9 g/dL (ref 6.1–8.1)

## 2016-09-22 LAB — CBC WITH DIFFERENTIAL/PLATELET
Basophils Absolute: 55 cells/uL (ref 0–200)
Basophils Relative: 1 %
Eosinophils Absolute: 220 cells/uL (ref 15–500)
Eosinophils Relative: 4 %
HCT: 41.8 % (ref 35.0–45.0)
Hemoglobin: 13.6 g/dL (ref 11.7–15.5)
Lymphocytes Relative: 59 %
Lymphs Abs: 3245 cells/uL (ref 850–3900)
MCH: 29 pg (ref 27.0–33.0)
MCHC: 32.5 g/dL (ref 32.0–36.0)
MCV: 89.1 fL (ref 80.0–100.0)
MPV: 9.8 fL (ref 7.5–12.5)
Monocytes Absolute: 495 cells/uL (ref 200–950)
Monocytes Relative: 9 %
Neutro Abs: 1485 cells/uL — ABNORMAL LOW (ref 1500–7800)
Neutrophils Relative %: 27 %
Platelets: 213 10*3/uL (ref 140–400)
RBC: 4.69 MIL/uL (ref 3.80–5.10)
RDW: 13.2 % (ref 11.0–15.0)
WBC: 5.5 10*3/uL (ref 4.0–10.5)

## 2016-09-22 LAB — LIPID PANEL
Cholesterol: 134 mg/dL (ref 125–200)
HDL: 58 mg/dL (ref >=46)
LDL Cholesterol: 64 mg/dL (ref <130)
Total CHOL/HDL Ratio: 2.3 Ratio (ref <=5.0)
Triglycerides: 59 mg/dL (ref <150)
VLDL: 12 mg/dL (ref <30)

## 2016-09-22 LAB — POCT URINALYSIS DIPSTICK
Bilirubin, UA: NEGATIVE
Blood, UA: NEGATIVE
Glucose, UA: NEGATIVE
Ketones, UA: NEGATIVE
Leukocytes, UA: NEGATIVE
Nitrite, UA: NEGATIVE
Protein, UA: NEGATIVE
Spec Grav, UA: 1.02
Urobilinogen, UA: 1
pH, UA: 7.5

## 2016-09-22 LAB — TSH: TSH: 1.96 mIU/L

## 2016-09-22 NOTE — Patient Instructions (Signed)
Smoking Cessation, Tips for Success If you are ready to quit smoking, congratulations! You have chosen to help yourself be healthier. Cigarettes bring nicotine, tar, carbon monoxide, and other irritants into your body. Your lungs, heart, and blood vessels will be able to work better without these poisons. There are many different ways to quit smoking. Nicotine gum, nicotine patches, a nicotine inhaler, or nicotine nasal spray can help with physical craving. Hypnosis, support groups, and medicines help break the habit of smoking. WHAT THINGS CAN I DO TO MAKE QUITTING EASIER?  Here are some tips to help you quit for good:  Pick a date when you will quit smoking completely. Tell all of your friends and family about your plan to quit on that date.  Do not try to slowly cut down on the number of cigarettes you are smoking. Pick a quit date and quit smoking completely starting on that day.  Throw away all cigarettes.   Clean and remove all ashtrays from your home, work, and car.  On a card, write down your reasons for quitting. Carry the card with you and read it when you get the urge to smoke.  Cleanse your body of nicotine. Drink enough water and fluids to keep your urine clear or pale yellow. Do this after quitting to flush the nicotine from your body.  Learn to predict your moods. Do not let a bad situation be your excuse to have a cigarette. Some situations in your life might tempt you into wanting a cigarette.  Never have "just one" cigarette. It leads to wanting another and another. Remind yourself of your decision to quit.  Change habits associated with smoking. If you smoked while driving or when feeling stressed, try other activities to replace smoking. Stand up when drinking your coffee. Brush your teeth after eating. Sit in a different chair when you read the paper. Avoid alcohol while trying to quit, and try to drink fewer caffeinated beverages. Alcohol and caffeine may urge you to  smoke.  Avoid foods and drinks that can trigger a desire to smoke, such as sugary or spicy foods and alcohol.  Ask people who smoke not to smoke around you.  Have something planned to do right after eating or having a cup of coffee. For example, plan to take a walk or exercise.  Try a relaxation exercise to calm you down and decrease your stress. Remember, you may be tense and nervous for the first 2 weeks after you quit, but this will pass.  Find new activities to keep your hands busy. Play with a pen, coin, or rubber band. Doodle or draw things on paper.  Brush your teeth right after eating. This will help cut down on the craving for the taste of tobacco after meals. You can also try mouthwash.   Use oral substitutes in place of cigarettes. Try using lemon drops, carrots, cinnamon sticks, or chewing gum. Keep them handy so they are available when you have the urge to smoke.  When you have the urge to smoke, try deep breathing.  Designate your home as a nonsmoking area.  If you are a heavy smoker, ask your health care provider about a prescription for nicotine chewing gum. It can ease your withdrawal from nicotine.  Reward yourself. Set aside the cigarette money you save and buy yourself something nice.  Look for support from others. Join a support group or smoking cessation program. Ask someone at home or at work to help you with your plan   to quit smoking.  Always ask yourself, "Do I need this cigarette or is this just a reflex?" Tell yourself, "Today, I choose not to smoke," or "I do not want to smoke." You are reminding yourself of your decision to quit.  Do not replace cigarette smoking with electronic cigarettes (commonly called e-cigarettes). The safety of e-cigarettes is unknown, and some may contain harmful chemicals.  If you relapse, do not give up! Plan ahead and think about what you will do the next time you get the urge to smoke. HOW WILL I FEEL WHEN I QUIT SMOKING? You  may have symptoms of withdrawal because your body is used to nicotine (the addictive substance in cigarettes). You may crave cigarettes, be irritable, feel very hungry, cough often, get headaches, or have difficulty concentrating. The withdrawal symptoms are only temporary. They are strongest when you first quit but will go away within 10-14 days. When withdrawal symptoms occur, stay in control. Think about your reasons for quitting. Remind yourself that these are signs that your body is healing and getting used to being without cigarettes. Remember that withdrawal symptoms are easier to treat than the major diseases that smoking can cause.  Even after the withdrawal is over, expect periodic urges to smoke. However, these cravings are generally short lived and will go away whether you smoke or not. Do not smoke! WHAT RESOURCES ARE AVAILABLE TO HELP ME QUIT SMOKING? Your health care provider can direct you to community resources or hospitals for support, which may include:  Group support.  Education.  Hypnosis.  Therapy.   This information is not intended to replace advice given to you by your health care provider. Make sure you discuss any questions you have with your health care provider.   Document Released: 09/08/2004 Document Revised: 01/01/2015 Document Reviewed: 05/29/2013 Elsevier Interactive Patient Education 2016 [place] for Adults - Female      [place]:  A routine yearly physical is a good way to check in with your primary care provider about your health and preventive screening. It is also an opportunity to share updates about your health and any concerns you have, and receive a thorough all-over exam.   Most health insurance companies pay for at least some preventative services.  Check with your health plan for specific coverages.  WHAT PREVENTATIVE SERVICES DO WOMEN NEED?  Adult women should have their weight and blood  pressure checked regularly.   Women age 81 and older should have their cholesterol levels checked regularly.  Women should be screened for cervical cancer with a Pap smear and pelvic exam beginning at either age 15, or 3 years after they become sexually activity.    Breast cancer screening generally begins at age 42 with a mammogram and breast exam by your primary care provider.    Beginning at age 59 and continuing to age 43, women should be screened for colorectal cancer.  Certain people may need continued testing until age 38.  Updating vaccinations is part of preventative care.  Vaccinations help protect against diseases such as the flu.  Osteoporosis is a disease in which the bones lose minerals and strength as we age. Women ages 12 and over should discuss this with their caregivers, as should women after menopause who have other risk factors.  Lab tests are generally done as part of preventative care to screen for anemia and blood disorders, to screen for problems with the kidneys and liver, to screen for  bladder problems, to check blood sugar, and to check your cholesterol level.  Preventative services generally include counseling about diet, exercise, avoiding tobacco, drugs, excessive alcohol consumption, and sexually transmitted infections.    GENERAL RECOMMENDATIONS FOR GOOD HEALTH:  Healthy diet:  Eat a variety of foods, including fruit, vegetables, animal or vegetable protein, such as meat, fish, chicken, and eggs, or beans, lentils, tofu, and grains, such as rice.  Drink plenty of water daily.  Decrease saturated fat in the diet, avoid lots of red meat, processed foods, sweets, fast foods, and fried foods.  Exercise:  Aerobic exercise helps maintain good heart health. At least 30-40 minutes of moderate-intensity exercise is recommended. For example, a brisk walk that increases your heart rate and breathing. This should be done on most days of the week.   Find a type of  exercise or a variety of exercises that you enjoy so that it becomes a part of your daily life.  Examples are running, walking, swimming, water aerobics, and biking.  For motivation and support, explore group exercise such as aerobic class, spin class, Zumba, Yoga,or  martial arts, etc.    Set exercise goals for yourself, such as a certain weight goal, walk or run in a race such as a 5k walk/run.  Speak to your primary care provider about exercise goals.  Disease prevention:  If you smoke or chew tobacco, find out from your caregiver how to quit. It can literally save your life, no matter how long you have been a tobacco user. If you do not use tobacco, never begin.   Maintain a healthy diet and normal weight. Increased weight leads to problems with blood pressure and diabetes.   The Body Mass Index or BMI is a way of measuring how much of your body is fat. Having a BMI above 27 increases the risk of heart disease, diabetes, hypertension, stroke and other problems related to obesity. Your caregiver can help determine your BMI and based on it develop an exercise and dietary program to help you achieve or maintain this important measurement at a healthful level.  High blood pressure causes heart and blood vessel problems.  Persistent high blood pressure should be treated with medicine if weight loss and exercise do not work.   Fat and cholesterol leaves deposits in your arteries that can block them. This causes heart disease and vessel disease elsewhere in your body.  If your cholesterol is found to be high, or if you have heart disease or certain other medical conditions, then you may need to have your cholesterol monitored frequently and be treated with medication.   Ask if you should have a cardiac stress test if your history suggests this. A stress test is a test done on a treadmill that looks for heart disease. This test can find disease prior to there being a problem.  Menopause can be  associated with physical symptoms and risks. Hormone replacement therapy is available to decrease these. You should talk to your caregiver about whether starting or continuing to take hormones is right for you.   Osteoporosis is a disease in which the bones lose minerals and strength as we age. This can result in serious bone fractures. Risk of osteoporosis can be identified using a bone density scan. Women ages 36 and over should discuss this with their caregivers, as should women after menopause who have other risk factors. Ask your caregiver whether you should be taking a calcium supplement and Vitamin D, to reduce the  rate of osteoporosis.   Avoid drinking alcohol in excess (more than two drinks per day).  Avoid use of street drugs. Do not share needles with anyone. Ask for professional help if you need assistance or instructions on stopping the use of alcohol, cigarettes, and/or drugs.  Brush your teeth twice a day with fluoride toothpaste, and floss once a day. Good oral hygiene prevents tooth decay and gum disease. The problems can be painful, unattractive, and can cause other health problems. Visit your dentist for a routine oral and dental check up and preventive care every 6-12 months.   Look at your skin regularly.  Use a mirror to look at your back. Notify your caregivers of changes in moles, especially if there are changes in shapes, colors, a size larger than a pencil eraser, an irregular border, or development of new moles.  Safety:  Use seatbelts 100% of the time, whether driving or as a passenger.  Use safety devices such as hearing protection if you work in environments with loud noise or significant background noise.  Use safety glasses when doing any work that could send debris in to the eyes.  Use a helmet if you ride a bike or motorcycle.  Use appropriate safety gear for contact sports.  Talk to your caregiver about gun safety.  Use sunscreen with a SPF (or skin protection factor)  of 15 or greater.  Lighter skinned people are at a greater risk of skin cancer. Don't forget to also wear sunglasses in order to protect your eyes from too much damaging sunlight. Damaging sunlight can accelerate cataract formation.   Practice safe sex. Use condoms. Condoms are used for birth control and to help reduce the spread of sexually transmitted infections (or STIs).  Some of the STIs are gonorrhea (the clap), chlamydia, syphilis, trichomonas, herpes, HPV (human papilloma virus) and HIV (human immunodeficiency virus) which causes AIDS. The herpes, HIV and HPV are viral illnesses that have no cure. These can result in disability, cancer and death.   Keep carbon monoxide and smoke detectors in your home functioning at all times. Change the batteries every 6 months or use a model that plugs into the wall.   Vaccinations:  Stay up to date with your tetanus shots and other required immunizations. You should have a booster for tetanus every 10 years. Be sure to get your flu shot every year, since 5%-20% of the [place] population comes down with the flu. The flu vaccine changes each year, so being vaccinated once is not enough. Get your shot in the fall, before the flu season peaks.   Other vaccines to consider:  Human Papilloma Virus or HPV causes cancer of the cervix, and other infections that can be transmitted from person to person. There is a vaccine for HPV, and females should get immunized between the ages of 35 and 79. It requires a series of 3 shots.   Pneumococcal vaccine to protect against certain types of pneumonia.  This is normally recommended for adults age 69 or older.  However, adults younger than 48 years old with certain underlying conditions such as diabetes, heart or lung disease should also receive the vaccine.  Shingles vaccine to protect against Varicella Zoster if you are older than age 55, or younger than 48 years old with certain underlying illness.  Hepatitis A vaccine to  protect against a form of infection of the liver by a virus acquired from food.  Hepatitis B vaccine to protect against a form of infection  of the liver by a virus acquired from blood or body fluids, particularly if you work in health care.  If you plan to travel internationally, check with your local health department for specific vaccination recommendations.  Cancer Screening:  Breast cancer screening is essential to preventive care for women. All women age 49 and older should perform a breast self-exam every month. At age 23 and older, women should have their caregiver complete a breast exam each year. Women at ages 53 and older should have a mammogram (x-ray film) of the breasts. Your caregiver can discuss how often you need mammograms.    Cervical cancer screening includes taking a Pap smear (sample of cells examined under a microscope) from the cervix (end of the uterus). It also includes testing for HPV (Human Papilloma Virus, which can cause cervical cancer). Screening and a pelvic exam should begin at age 26, or 3 years after a woman becomes sexually active. Screening should occur every year, with a Pap smear but no HPV testing, up to age 11. After age 34, you should have a Pap smear every 3 years with HPV testing, if no HPV was found previously.   Most routine colon cancer screening begins at the age of 25. On a yearly basis, doctors may provide special easy to use take-home tests to check for hidden blood in the stool. Sigmoidoscopy or colonoscopy can detect the earliest forms of colon cancer and is life saving. These tests use a small camera at the end of a tube to directly examine the colon. Speak to your caregiver about this at age 43, when routine screening begins (and is repeated every 5 years unless early forms of pre-cancerous polyps or small growths are found).   Raynaud Phenomenon Raynaud phenomenon is a condition that affects the blood vessels (arteries) that carry blood to your  fingers and toes. The arteries that supply blood to your ears or the tip of your nose might also be affected. Raynaud phenomenon causes the arteries to temporarily narrow. As a result, the flow of blood to the affected areas is temporarily decreased. This usually occurs in response to cold temperatures or stress. During an attack, the skin in the affected areas turns white. You may also feel tingling or numbness in those areas. Attacks usually last for only a brief period, and then the blood flow to the area returns to normal. In most cases, Raynaud phenomenon does not cause serious health problems. CAUSES  For many people with this condition, the cause is not known. Raynaud phenomenon is sometimes associated with other diseases, such as scleroderma or lupus. RISK FACTORS Raynaud phenomenon can affect anyone, but it develops most often in people who are 57-52 years old. It affects more females than males. SIGNS AND SYMPTOMS Symptoms of Raynaud phenomenon may occur when you are exposed to cold temperatures or when you have emotional stress. The symptoms may last for a few minutes or up to several hours. They usually affect your fingers but may also affect your toes, ears, or the tip of your nose. Symptoms may include:  Changes in skin color. The skin in the affected areas will turn pale or white. The skin may then change from white to bluish to red as normal blood flow returns to the area.  Numbness, tingling, or pain in the affected areas. In severe cases, sores may develop in the affected areas.  DIAGNOSIS  Your health care provider will do a physical exam and take your medical history. You may  be asked to put your hands in cold water to check for a reaction to cold temperature. Blood tests may be done to check for other diseases or conditions. Your health care provider may also order a test to check the movement of blood through your arteries and veins (vascular ultrasound). TREATMENT  Treatment  often involves making lifestyle changes and taking steps to control your exposure to cold temperatures. For more severe cases, medicine (calcium channel blockers) may be used to improve blood flow. Surgery is sometimes done to block the nerves that control the affected arteries, but this is rare. HOME CARE INSTRUCTIONS   Avoid exposure to cold by taking these steps:  If possible, stay indoors during cold weather.  When you go outside during cold weather, dress in layers and wear mittens, a hat, a scarf, and warm footwear.  Wear mittens or gloves when handling ice or frozen food.  Use holders for glasses or cans containing cold drinks.  Let warm water run for a while before taking a shower or bath.  Warm up the car before driving in cold weather.  If possible, avoid stressful and emotional situations. Exercise, meditation, and yoga may help you cope with stress. Biofeedback may be useful.  Do not use any tobacco products, including cigarettes, chewing tobacco, or electronic cigarettes. If you need help quitting, ask your health care provider.  Avoid secondhand smoke.  Limit your use of caffeine. Switch to decaffeinated coffee, tea, and soda. Avoid chocolate.  Wear loose fitting socks and comfortable, roomy shoes.  Avoid vibrating tools and machinery.  Take medicines only as directed by your health care provider. SEEK MEDICAL CARE IF:   Your discomfort becomes worse despite lifestyle changes.  You develop sores on your fingers or toes that do not heal.  Your fingers or toes turn black.  You have breaks in the skin on your fingers or toes.  You have a fever.  You have pain or swelling in your joints.  You have a rash.  Your symptoms occur on only one side of your body.   This information is not intended to replace advice given to you by your health care provider. Make sure you discuss any questions you have with your health care provider.   Document Released: 12/08/2000  Document Revised: 01/01/2015 Document Reviewed: 07/16/2014 Elsevier Interactive Patient Education [employer].

## 2016-09-22 NOTE — Progress Notes (Signed)
Subjective:    Patient ID: Amanda Rich, female    DOB: 12-04-1966, 50 y.o.   MRN: [id number]  HPI Chief Complaint  Patient presents with  . fasting cpe    fasting cpe, pap 2015   She is here for a complete physical exam. Last CPE: 2 years ago.   Other providers: GI - Dr. Collene Mares. Orthopedist- Dr. Luna Glasgow, eye dr.   Past medical history: intermittent constipation  Mild depression- has taken Celexa in past and was switched to mirtazapine and that made her too sleepy. States mood is pretty good. Most of her days are good. She spends a lot of her time with grandchildren.   She has had a hidascan and it showed gallbladder functioning at 45%. Dr. Collene Mares has been following her for GI issues.  Surgeries: umbilical hernia repair, tubal ligation, endometrial ablation.   Social history: Lives with fiance Smokes 1 ppd for 31 years quit for 3 years. Drinks alcohol beer on weekend, denies drug use  Diet: nothing particular Excerise: nothing currently.   Immunizations: Tdap- unknown. Would like this today.   Health maintenance:  Mammogram: October 20 2016.  Colonoscopy: never, would like to get this done next year.  Last Gynecological Exam: 2015 and normal per patient  Last Menstrual cycle: stopped April 2017.  Contraception: tubal ligation. Same partner for 4 years. Declines STD testing.  Has been tested for HIV years ago and was negative. [facility]  Pregnancies: 3 Last Dental Exam: twice annually. Dentures on top.  Last Eye Exam: prescription glasses- is due for eye exam   Wears seatbelt always, uses sunscreen, smoke detectors in home and functioning, does not text while driving and feels safe in home environment.   Depression screen PHQ 2/9 09/22/2016  Decreased Interest 1  Down, Depressed, Hopeless 1  PHQ - 2 Score 2  Altered sleeping 1  Tired, decreased energy 1  Change in appetite 1  Feeling bad or failure about yourself  0  Trouble concentrating 0  Moving  slowly or fidgety/restless 1  Suicidal thoughts 0  PHQ-9 Score 6     Reviewed allergies, medications, past medical, surgical, family, and social history. Past Medical History:  Diagnosis Date  . Anemia      Review of Systems Review of Systems Constitutional: -fever, -chills, -sweats, -unexpected weight change,-fatigue ENT: -runny nose, -ear pain, -sore throat Cardiology:  -chest pain, -palpitations, -edema Respiratory: -cough, -shortness of breath, -wheezing Gastroenterology: -abdominal pain, -nausea, -vomiting, -diarrhea, -constipation  Hematology: -bleeding or bruising problems Musculoskeletal: -arthralgias, -myalgias, -joint swelling, -back pain Ophthalmology: -vision changes Urology: -dysuria, -difficulty urinating, -hematuria, -urinary frequency, -urgency Neurology: -headache, -weakness, -tingling, -numbness       Objective:   Physical Exam BP 122/70   Pulse 61   Ht 5' 4.5" (1.638 m)   Wt 116 lb (52.6 kg)   BMI 19.60 kg/m   General Appearance:    Alert, cooperative, no distress, appears stated age  Head:    Normocephalic, without obvious abnormality, atraumatic  Eyes:    PERRL, conjunctiva/corneas clear, EOM's intact, fundi    benign  Ears:    Normal TM's and external ear canals  Nose:   Nares normal, mucosa normal, no drainage or sinus   tenderness  Throat:   Lips, mucosa, and tongue normal; teeth and gums normal  Neck:   Supple, no lymphadenopathy;  thyroid:  no   enlargement/tenderness/nodules; no carotid   bruit or JVD  Back:    Spine nontender, no curvature,  ROM normal, no CVA     tenderness  Lungs:     Clear to auscultation bilaterally without wheezes, rales or     ronchi; respirations unlabored  Chest Wall:    No tenderness or deformity   Heart:    Regular rate and rhythm, S1 and S2 normal, no murmur, rub   or gallop  Breast Exam:    Not done. Mammogram next month.   Abdomen:     Soft, non-tender, nondistended, normoactive bowel sounds,    no masses,  no hepatosplenomegaly  Genitalia:    Normal external genitalia without lesions.  BUS and vagina normal; cervix without lesions, or cervical motion tenderness. No abnormal vaginal discharge.  Uterus and adnexa not enlarged, nontender, no masses.  Pap performed. Chaperone present.   Rectal:   declined  Extremities:   No clubbing, cyanosis or edema  Pulses:   2+ and symmetric all extremities  Skin:   Skin color, texture, turgor normal, no rashes or lesions  Lymph nodes:   Cervical, supraclavicular, and axillary nodes normal  Neurologic:   CNII-XII intact, normal strength, sensation and gait; reflexes 2+ and symmetric throughout          Psych:   Normal mood, affect, hygiene and grooming.    Urinalysis dipstick: negative     Assessment & Plan:  Routine general medical examination at a health care facility - Plan: Urinalysis Dipstick, CBC with Differential/Platelet, COMPLETE METABOLIC PANEL WITH GFR, Cytology - PAP, TSH, Hemoglobin A1c  Depression  Mixed hyperlipidemia - Plan: Lipid panel  Screening for cervical cancer - Plan: Cytology - PAP  Need for Tdap vaccination - Plan: Tdap vaccine greater than or equal to 7yo IM  Other fatigue - Plan: CBC with Differential/Platelet, TSH  History of anemia - Plan: CBC with Differential/Platelet  Family history of diabetes mellitus - Plan: Hemoglobin A1c  Smoker  Discussed that she appears to have a good outlook on life and is enjoying her time with her grandchildren. She is not requesting medication at this time.  She is having some fatigue but not bothersome. Plan to order labs. She does have a history of anemia and will check CBC.  She is smoking and not ready to quit. Counseling on smoking cessation.  She is scheduled for a mammogram.  Pap smear done.  Declines flu shot.  Declines STD testing.  Tdap updated.  Follow up pending labs or in 1 year.

## 2016-09-23 LAB — HEMOGLOBIN A1C
Hgb A1c MFr Bld: 4.8 % (ref <5.7)
Mean Plasma Glucose: 91 mg/dL

## 2016-09-26 LAB — CYTOLOGY - PAP

## 2016-10-20 ENCOUNTER — Ambulatory Visit: Payer: BLUE CROSS/BLUE SHIELD

## 2016-10-23 ENCOUNTER — Ambulatory Visit
Admission: RE | Admit: 2016-10-23 | Discharge: 2016-10-23 | Disposition: A | Discharge status: Home / Self Care | Payer: BLUE CROSS/BLUE SHIELD | Source: Ambulatory Visit | Attending: Family Medicine | Admitting: Family Medicine

## 2016-10-23 DIAGNOSIS — Z1231 Encounter for screening mammogram for malignant neoplasm of breast: Secondary | ICD-10-CM | POA: Diagnosis not present

## 2017-01-02 ENCOUNTER — Institutional Professional Consult (permissible substitution): Payer: BLUE CROSS/BLUE SHIELD | Admitting: Family Medicine

## 2017-01-09 ENCOUNTER — Ambulatory Visit
Admission: RE | Admit: 2017-01-09 | Discharge: 2017-01-09 | Disposition: A | Discharge status: Home / Self Care | Payer: BLUE CROSS/BLUE SHIELD | Source: Ambulatory Visit | Attending: Family Medicine | Admitting: Family Medicine

## 2017-01-09 ENCOUNTER — Encounter: Payer: Self-pay | Admitting: Family Medicine

## 2017-01-09 ENCOUNTER — Ambulatory Visit (INDEPENDENT_AMBULATORY_CARE_PROVIDER_SITE_OTHER): Payer: BLUE CROSS/BLUE SHIELD | Admitting: Family Medicine

## 2017-01-09 VITALS — BP 120/70 | HR 67 | Wt 115.8 lb

## 2017-01-09 DIAGNOSIS — F17200 Nicotine dependence, unspecified, uncomplicated: Secondary | ICD-10-CM

## 2017-01-09 DIAGNOSIS — R634 Abnormal weight loss: Secondary | ICD-10-CM

## 2017-01-09 DIAGNOSIS — R059 Cough, unspecified: Secondary | ICD-10-CM

## 2017-01-09 DIAGNOSIS — R630 Anorexia: Secondary | ICD-10-CM

## 2017-01-09 DIAGNOSIS — N939 Abnormal uterine and vaginal bleeding, unspecified: Secondary | ICD-10-CM

## 2017-01-09 DIAGNOSIS — R05 Cough: Secondary | ICD-10-CM | POA: Diagnosis not present

## 2017-01-09 LAB — COMPREHENSIVE METABOLIC PANEL
ALT: 14 U/L (ref 6–29)
AST: 21 U/L (ref 10–35)
Albumin: 4.6 g/dL (ref 3.6–5.1)
Alkaline Phosphatase: 34 U/L (ref 33–115)
BUN: 9 mg/dL (ref 7–25)
CO2: 26 mmol/L (ref 20–31)
Calcium: 9.4 mg/dL (ref 8.6–10.2)
Chloride: 105 mmol/L (ref 98–110)
Creat: 0.77 mg/dL (ref 0.50–1.10)
Glucose, Bld: 87 mg/dL (ref 65–99)
Potassium: 3.9 mmol/L (ref 3.5–5.3)
Sodium: 141 mmol/L (ref 135–146)
Total Bilirubin: 0.4 mg/dL (ref 0.2–1.2)
Total Protein: 7.4 g/dL (ref 6.1–8.1)

## 2017-01-09 LAB — CBC WITH DIFFERENTIAL/PLATELET
Basophils Absolute: 46 cells/uL (ref 0–200)
Basophils Relative: 1 %
Eosinophils Absolute: 92 cells/uL (ref 15–500)
Eosinophils Relative: 2 %
HCT: 37.7 % (ref 35.0–45.0)
Hemoglobin: 12.1 g/dL (ref 11.7–15.5)
Lymphocytes Relative: 45 %
Lymphs Abs: 2070 cells/uL (ref 850–3900)
MCH: 28.9 pg (ref 27.0–33.0)
MCHC: 32.1 g/dL (ref 32.0–36.0)
MCV: 90 fL (ref 80.0–100.0)
MPV: 9.6 fL (ref 7.5–12.5)
Monocytes Absolute: 552 cells/uL (ref 200–950)
Monocytes Relative: 12 %
Neutro Abs: 1840 cells/uL (ref 1500–7800)
Neutrophils Relative %: 40 %
Platelets: 251 10*3/uL (ref 140–400)
RBC: 4.19 MIL/uL (ref 3.80–5.10)
RDW: 13.2 % (ref 11.0–15.0)
WBC: 4.6 10*3/uL (ref 4.0–10.5)

## 2017-01-09 LAB — POCT URINALYSIS DIPSTICK
Bilirubin, UA: NEGATIVE
Blood, UA: NEGATIVE
Glucose, UA: NEGATIVE
Ketones, UA: NEGATIVE
Leukocytes, UA: NEGATIVE
Nitrite, UA: NEGATIVE
Protein, UA: NEGATIVE
Spec Grav, UA: >=1.03
Urobilinogen, UA: NEGATIVE
pH, UA: 6

## 2017-01-09 LAB — TSH: TSH: 1.61 mIU/L

## 2017-01-09 NOTE — Progress Notes (Signed)
Subjective:    Patient ID: Amanda Rich, female    DOB: [birth year], 52 y.o.   MRN: [id number]  HPI Chief Complaint  Patient presents with  . consult    consult- take about meds and cycle   She is here with multiple concerns. States her periods stopped in March 2017 and started back in November. Sates periods are regular and seem to be similar to her normal periods. Denies abdominal pain, cramping, or heavy bleeding. No vaginal discharge. Normal pap smear in 2017. States she is sexually active and no issues with this.  Had a tubal ligation.   She started taking turmeric for joint pain and is feeling really good with this.   States she cannot seem to gain weight. She reports she is eating small frequent meals. Food recall from yesterday was "abnormal" per patient. Reports having lucky charms with whole milk, sausage egg and cheese Mcmuffin, bojangles chicken and egg sandwich, chicken sandwich from [employer] around [place]. She works 3rd shift 11pm-7am.  Had a bowl of [place] stew around 1am. 2 cups of coffee and an energy drink.  States she does not normally eat this much. She does not count her calories but states she thinks she normally eats about 500-700 calories due to decreased appetite.   States in HS she weighed 156. States she lost weight after having children. Has been getting smaller since having gallbladder issue last year.  Dr. Collene Mares is her GI and she has not followed up with her.  Has regular daily bowel movements and denies blood in stool. Eats yogurt daily.  Denies fever, chills, night sweats, fatigue, chest pain, palpitations, shortness of breath, abdominal pain, N/V/D. No skin changes. Denies mood changes but does reports a history of depression. States she often worries about her family and have a moderate amount of stress.   She does report having a "smokers cough". Cough is non productive and ongoing for several months.   States she sleeps 2 hours in the morning and  sleeps another 5 hours in the afternoon. Works 5 nights per week.  Has been working nights for the past 4 years.  Reports having a decreased appetite for approximately one year.  She is smoking one pack per day.   No recent HIV testing.   Past Medical History:  Diagnosis Date  . Anemia   . Depression    Past Surgical History:  Procedure Laterality Date  . ABLATION ON ENDOMETRIOSIS    . GANGLION CYST EXCISION  2007   L wrist; [facility], [place]  . TUBAL LIGATION     [facility]  . UMBILICAL HERNIA REPAIR  2005   [place]     Review of Systems Pertinent positives and negatives in the history of present illness.     Objective:   Physical Exam BP 120/70   Pulse 67   Wt 115 lb 12.8 oz (52.5 kg)   LMP 01/05/2017   BMI 19.57 kg/m  Alert and in no distress.  Pharyngeal area is normal. Neck is supple without adenopathy or thyromegaly. Cardiac exam shows a regular sinus rhythm without murmurs or gallops. Lungs are clear to auscultation.   UA dipstick negative     Assessment & Plan:  Decreased appetite - Plan: DG Chest 2 View, CBC with Differential/Platelet, Comprehensive metabolic panel, RPR, HIV antibody, TSH, POCT urinalysis dipstick  Smoker  Cough  Abnormal uterine bleeding (AUB) - Plan: DG Chest 2 View, CBC with Differential/Platelet, Comprehensive metabolic panel, RPR, HIV antibody, TSH,  POCT urinalysis dipstick, Estrogens, Total, Progesterone, FSH/LH  Unexplained weight loss - Plan: DG Chest 2 View, CBC with Differential/Platelet, Comprehensive metabolic panel, RPR, HIV antibody, POCT occult blood stool  Has lost 3 more lbs since her last visit. She has not been keeping track of her calories as recommended at her last visit. Counseled her on this again and showed her the free app My Fitness Pal. Will check labs to rule out underlying etiology for weight loss and decreased appetite.  CPE and labs done in September 2017.  She did not follow up with GI as recommended and I advised her to  call and set up appointment with Dr. Collene Mares. Stool cards sent home to rule out GI bleeding.  Suspect she is perimenopausal and AUB is nothing serious. She is asymptomatic and her periods appear to be regular and not bothersome.  Smoking cessation discussed at length.  Follow up pending XR and labs.  She will return in 2 weeks with calorie documentation and to recheck weight.  May need to consider psychological etiology if rule out phsyiological explanation.

## 2017-01-09 NOTE — Telephone Encounter (Signed)
This encounter was created in error - please disregard.

## 2017-01-09 NOTE — Patient Instructions (Addendum)
Use My Fitness Pal, a free app to track your daily calories.   Call and schedule a follow up with Dr. Collene Mares.   Stop smoking.   Return stool cards to our office.   We will call with lab results.   Follow up with me in 2 weeks to go over your daily calorie intake.

## 2017-01-10 LAB — HIV ANTIBODY (ROUTINE TESTING W REFLEX): HIV 1&2 Ab, 4th Generation: NONREACTIVE

## 2017-01-10 LAB — RPR

## 2017-01-10 LAB — FSH/LH
FSH: 59.1 m[IU]/mL
LH: 29.4 m[IU]/mL

## 2017-01-10 LAB — PROGESTERONE: Progesterone: 0.7 ng/mL

## 2017-01-14 LAB — ESTROGENS, TOTAL: Estrogen: 130.1 pg/mL

## 2017-01-15 ENCOUNTER — Other Ambulatory Visit (INDEPENDENT_AMBULATORY_CARE_PROVIDER_SITE_OTHER): Payer: BLUE CROSS/BLUE SHIELD

## 2017-01-15 DIAGNOSIS — R634 Abnormal weight loss: Secondary | ICD-10-CM

## 2017-01-15 LAB — HEMOCCULT GUIAC POC 1CARD (OFFICE)
Card #2 Fecal Occult Blod, POC: NEGATIVE
Card #3 Fecal Occult Blood, POC: NEGATIVE
Fecal Occult Blood, POC: NEGATIVE

## 2017-01-24 ENCOUNTER — Ambulatory Visit (INDEPENDENT_AMBULATORY_CARE_PROVIDER_SITE_OTHER): Payer: BLUE CROSS/BLUE SHIELD | Admitting: Family Medicine

## 2017-01-24 ENCOUNTER — Encounter: Payer: Self-pay | Admitting: Family Medicine

## 2017-01-24 VITALS — BP 130/70 | HR 58 | Temp 98.2°F | Resp 16 | Ht 65.0 in | Wt 113.0 lb

## 2017-01-24 DIAGNOSIS — R634 Abnormal weight loss: Secondary | ICD-10-CM | POA: Insufficient documentation

## 2017-01-24 DIAGNOSIS — E639 Nutritional deficiency, unspecified: Secondary | ICD-10-CM | POA: Diagnosis not present

## 2017-01-24 DIAGNOSIS — Z1211 Encounter for screening for malignant neoplasm of colon: Secondary | ICD-10-CM

## 2017-01-24 DIAGNOSIS — R6889 Other general symptoms and signs: Secondary | ICD-10-CM

## 2017-01-24 LAB — POC INFLUENZA A&B (BINAX/QUICKVUE)
Influenza A, POC: NEGATIVE
Influenza B, POC: NEGATIVE

## 2017-01-24 NOTE — Patient Instructions (Signed)
Your flu test is negative. I suspect your symptoms are related to a viral illness and recommend treating your symptoms at this point.  Mucinex DM, Robitussin DM, or Delsym for congestion and cough, drink extra water, use salt water gargles for throat irritation and Tylenol or Ibuprofen for aches and pains.  Call if you are not improving by days 7-10 of your illness or if you develop fever, wheezing or worsening symptoms.

## 2017-01-24 NOTE — Progress Notes (Signed)
   Subjective:    Patient ID: Amanda Rich, female    DOB: 1966-08-18, 51 y.o.   MRN: [id number]  HPI Chief Complaint  Patient presents with  . 2 week follow-up    2 week follow-up, sick for the last couple days- eye pain, back pain, headache, sinus pressure   She is here to follow up on weight loss. She has lost 2 more lbs since our last visit 2 weeks ago. Weight loss has been gradual over the past year or so since having gallbladder issues. She denies early satiety, nausea, vomiting, constipation, diarrhea or abdominal pain.  States she has been eating but has been short of weekly calories goal both weeks. She brought in her calorie journal and has been eating [phone] calories. Has been drinking ensure and boost most days but her diet consists mostly of foods with poor nutritional value.  Denies fever, chills, night sweats.   She is up to date on mammogram and pap smear. Recent labs done and nothing abnormal.  Has never had a colonoscopy but has seen Dr. Collene Mares in the past year for gallbladder issues. She did not follow up as recommended.   She has an acute complaint today. States she has been feeling sick with cold symptoms for the past 4 days. Also reports headache, sinus congestion, rhinorrhea, and cough. States 2 days this week she felt so bad that she did not eat.  Has been taking Alka seltzer cold.    States she cut back on cigarettes this week because she has been sick.   She reports a family history of cancer, colon cancer in uncle. Mother with breast cancer and sister with unknown type.   Reviewed allergies, medications, past medical, surgical, family, and social history.    Review of Systems Pertinent positives and negatives in the history of present illness.     Objective:   Physical Exam BP 130/70   Pulse (!) 58   Temp 98.2 F (36.8 C) (Oral)   Resp 16   Ht 5\' 5"  (1.651 m)   Wt 113 lb (51.3 kg)   LMP 01/05/2017   BMI 18.80 kg/m   Alert and in no distress.  No sinus tenderness. Nares patent with erythema and clear discharge. Tympanic membranes and canals are normal. Pharyngeal area is normal. Neck is supple without adenopathy or thyromegaly. Cardiac exam shows a regular sinus rhythm without murmurs or gallops. Lungs are clear to auscultation.      Assessment & Plan:  Weight loss, abnormal - Plan: CANCELED: Amb ref to Medical Nutrition Therapy-MNT  Poor diet - Plan: CANCELED: Amb ref to Medical Nutrition Therapy-MNT  Flu-like symptoms - Plan: POC Influenza A&B(BINAX/QUICKVUE)  Screen for colon cancer  Flu swab is negative. She appears to have a viral illness and discussed supportive care. She will let me know if she is not getting better by day 7-10 of her illness or if she gets worse.  Review of chest XR and recent labs are normal.  Discussed weight loss and insufficient calorie intake. Also discussed screening for cancer and she is up to date on screening guidelines except for colonoscopy.  She will call and schedule an appointment with Dr. Collene Mares, GI for screening colonoscopy and for further evaluation of weight los.  Referral to nutritionist to help with diet if negative GI visit.

## 2017-05-11 ENCOUNTER — Ambulatory Visit (INDEPENDENT_AMBULATORY_CARE_PROVIDER_SITE_OTHER): Payer: BLUE CROSS/BLUE SHIELD | Admitting: Family Medicine

## 2017-05-11 VITALS — BP 116/74 | HR 77 | Wt 116.0 lb

## 2017-05-11 DIAGNOSIS — L739 Follicular disorder, unspecified: Secondary | ICD-10-CM | POA: Diagnosis not present

## 2017-05-11 MED ORDER — CLARITHROMYCIN 500 MG PO TABS: 500.0000 mg | ORAL_TABLET | Freq: Two times a day (BID) | ORAL | 0 refills | Status: DC

## 2017-05-11 NOTE — Progress Notes (Signed)
   Subjective:    Patient ID: Amanda Rich, female    DOB: Jun 14, 1966, 51 y.o.   MRN: [id number]  HPI He has noted difficulty with the rash in the axillary area as well as in the pubic area starting last Sunday. At that point she did use a razor for the first time and noted the rash occurring after that. She says that these itching and erythema is slowly getting worse.   Review of Systems     Objective:   Physical Exam Alert and in no distress. Both axilla show evidence of erythema with a follicular pattern as well as being confluent . Exam of the pubic area again shows erythema with a follicular pattern that also goes on to the medial aspect of both thighs.       Assessment & Plan:  Folliculitis of both axillae - Plan: clarithromycin (BIAXIN) 500 MG tablet  She will call if no improvement by the first of the week.

## 2017-05-12 ENCOUNTER — Encounter (HOSPITAL_COMMUNITY): Payer: Self-pay

## 2017-05-12 ENCOUNTER — Emergency Department (HOSPITAL_COMMUNITY)
Admission: EM | Admit: 2017-05-12 | Discharge: 2017-05-12 | Disposition: A | Discharge status: Home / Self Care | Payer: BLUE CROSS/BLUE SHIELD | Attending: Emergency Medicine | Admitting: Emergency Medicine

## 2017-05-12 DIAGNOSIS — L739 Follicular disorder, unspecified: Secondary | ICD-10-CM | POA: Diagnosis not present

## 2017-05-12 DIAGNOSIS — T7840XA Allergy, unspecified, initial encounter: Secondary | ICD-10-CM | POA: Diagnosis not present

## 2017-05-12 DIAGNOSIS — Z7982 Long term (current) use of aspirin: Secondary | ICD-10-CM | POA: Insufficient documentation

## 2017-05-12 DIAGNOSIS — R21 Rash and other nonspecific skin eruption: Secondary | ICD-10-CM | POA: Diagnosis present

## 2017-05-12 DIAGNOSIS — H5212 Myopia, left eye: Secondary | ICD-10-CM | POA: Diagnosis not present

## 2017-05-12 DIAGNOSIS — F17210 Nicotine dependence, cigarettes, uncomplicated: Secondary | ICD-10-CM | POA: Insufficient documentation

## 2017-05-12 MED ORDER — PREDNISONE 20 MG PO TABS
60.0000 mg | ORAL_TABLET | Freq: Once | ORAL | Status: AC
  Administered 2017-05-12: 60 mg via ORAL
  Filled 2017-05-12: qty 3

## 2017-05-12 MED ORDER — PREDNISONE 10 MG PO TABS: 20.0000 mg | ORAL_TABLET | Freq: Two times a day (BID) | ORAL | 0 refills | Status: DC

## 2017-05-12 MED ORDER — CEPHALEXIN 500 MG PO CAPS: 500.0000 mg | ORAL_CAPSULE | Freq: Four times a day (QID) | ORAL | 0 refills | Status: DC

## 2017-05-12 MED ORDER — CEPHALEXIN 250 MG PO CAPS
500.0000 mg | ORAL_CAPSULE | Freq: Once | ORAL | Status: AC
  Administered 2017-05-12: 500 mg via ORAL
  Filled 2017-05-12: qty 2

## 2017-05-12 MED ORDER — HYDROXYZINE HCL 25 MG PO TABS
25.0000 mg | ORAL_TABLET | Freq: Once | ORAL | Status: AC
  Administered 2017-05-12: 25 mg via ORAL
  Filled 2017-05-12: qty 1

## 2017-05-12 MED ORDER — HYDROXYZINE HCL 25 MG PO TABS: 25.0000 mg | ORAL_TABLET | Freq: Four times a day (QID) | ORAL | 0 refills | Status: DC

## 2017-05-12 MED ORDER — NYSTATIN-TRIAMCINOLONE 100000-0.1 UNIT/GM-% EX CREA: TOPICAL_CREAM | CUTANEOUS | 0 refills | Status: DC

## 2017-05-12 NOTE — ED Triage Notes (Signed)
Pt had rash from shaving that wasn't subsiding and went to MD then sts placed on abx but rash is spreading

## 2017-05-12 NOTE — ED Provider Notes (Signed)
[facility] [place] [facility] DEPT Provider Note   CSN: [id number] Arrival date & time: 05/12/17  2038  By signing my name below, I, Sonum Patel, attest that this documentation has been prepared under the direction and in the presence of Debroah Baller, NP. Electronically Signed: Ludger Nutting, Scribe. 05/12/17. 11:22 PM.  History   Chief Complaint Chief Complaint  Patient presents with  . Rash    The history is provided by the patient. No language interpreter was used.  Rash   This is a new problem. The current episode started more than 2 days ago. The problem has been gradually worsening. Associated with: shaving. There has been no fever. The pain is mild. The pain has been constant since onset. She has tried anti-itch cream (abx) for the symptoms. The treatment provided no relief.     HPI Comments: Amanda Rich is a 51 y.o. female who presents to the Emergency Department complaining of a pruritic rash to the bilateral axilla and bilateral inguinal area that began 1 week ago after shaving. She was seen by her PCP yesterday who prescribed her clarithromycin which she has taken with worsening symptoms. She has tried hydrocortisone cream without significant relief. She reports associated burning pain and itching to the affected areas. She denies wheezing, SOB, nausea, vomiting, fever, chills.   Past Medical History:  Diagnosis Date  . Anemia   . Depression     Patient Active Problem List   Diagnosis Date Noted  . Poor diet 01/24/2017  . Weight loss, abnormal 01/24/2017  . Smoker 09/22/2016  . Mixed hyperlipidemia 08/07/2016  . Irregular intermenstrual bleeding 08/07/2016  . Depression 08/07/2016  . 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 Pelvic congestion syndrome 08/07/2016    Past Surgical History:  Procedure Laterality Date  . ABLATION ON ENDOMETRIOSIS    . GANGLION CYST EXCISION  2007   L wrist; [facility], [place]  . TUBAL LIGATION     [facility]  . UMBILICAL HERNIA REPAIR  2005   [place]     OB History    No data available       Home Medications    Prior to Admission medications   Medication Sig Start Date End Date Taking? Authorizing Provider  aspirin 81 MG tablet Take 81 mg by mouth daily.    [provider]  Biotin 5000 MCG CAPS Take by mouth.    [provider]  cephALEXin (KEFLEX) 500 MG capsule Take 1 capsule (500 mg total) by mouth 4 (four) times daily. 05/12/17   Ashley Murrain, NP  Cholecalciferol (VITAMIN D3) 2000 units TABS Take by mouth.    [provider]  clarithromycin (BIAXIN) 500 MG tablet Take 1 tablet (500 mg total) by mouth 2 (two) times daily. 05/11/17   Denita Lung, MD  hydrOXYzine (ATARAX/VISTARIL) 25 MG tablet Take 1 tablet (25 mg total) by mouth every 6 (six) hours. 05/12/17   Ashley Murrain, NP  Multiple Vitamin (MULTIVITAMIN WITH MINERALS) TABS Take 1 tablet by mouth daily.    [provider]  nystatin-triamcinolone Lilyan Gilford II) cream Apply to affected area daily 05/12/17   Ashley Murrain, NP  predniSONE (DELTASONE) 10 MG tablet Take 2 tablets (20 mg total) by mouth 2 (two) times daily with a meal. 05/12/17   Ashley Murrain, NP  TURMERIC PO Take by mouth.    [provider]    Family History Family History  Problem Relation Age of Onset  . Diabetes Mother  Social History Social History  Substance Use Topics  . Smoking status: Current Every Day Smoker    Packs/day: 1.00    Years: 25.00    Types: Cigarettes  . Smokeless tobacco: Never Used     Comment: smoking cigarettes again and not e-cigs  . Alcohol use 0.6 oz/week    1 Glasses of wine per week     Allergies   Patient has no known allergies.   Review of Systems Review of Systems  Constitutional: Negative for chills and fever.  HENT: Negative for sore throat and trouble swallowing.   Respiratory: Negative for shortness of breath and wheezing.   Gastrointestinal: Negative for nausea and vomiting.  Skin: Positive for rash.   Neurological: Negative for headaches.  Psychiatric/Behavioral: The patient is not nervous/anxious.      Physical Exam Updated Vital Signs BP 122/87 (BP Location: Left Arm)   Pulse 88   Temp 97.8 F (36.6 C) (Oral)   Resp 16   Ht 5\' 5"  (1.651 m)   Wt 117 lb (53.1 kg)   SpO2 99%   BMI 19.47 kg/m   Physical Exam  Constitutional: She appears well-developed and well-nourished. No distress.  HENT:  Head: Normocephalic.  Eyes: EOM are normal.  Neck: Neck supple.  Cardiovascular: Normal rate and regular rhythm.   Pulmonary/Chest: Effort normal and breath sounds normal.  Abdominal: Soft. There is no tenderness.  Genitourinary:  Genitourinary Comments: Pubic area with erythema s/p shaving pubic hair, areas of folliculitis noted.   Musculoskeletal: Normal range of motion.  Neurological: She is alert.  Skin: Skin is warm and dry. There is erythema.  Redness and irritation to bilateral axilla  Psychiatric: She has a normal mood and affect. Her behavior is normal.  Nursing note and vitals reviewed.    ED Treatments / Results  DIAGNOSTIC STUDIES: Oxygen Saturation is 100% on RA, normal by my interpretation.    COORDINATION OF CARE: 11:18 PM Discussed treatment plan with pt at bedside and pt agreed to plan.   Labs (all labs ordered are listed, but only abnormal results are displayed) Labs Reviewed - No data to display  Radiology No results found.  Procedures Procedures (including critical care time)  Medications Ordered in ED Medications  cephALEXin (KEFLEX) capsule 500 mg (500 mg Oral Given 05/12/17 2325)  hydrOXYzine (ATARAX/VISTARIL) tablet 25 mg (25 mg Oral Given 05/12/17 2325)  predniSONE (DELTASONE) tablet 60 mg (60 mg Oral Given 05/12/17 2325)     Initial Impression / Assessment and Plan / ED Course  I have reviewed the triage vital signs and the nursing notes.  Final Clinical Impressions(s) / ED Diagnoses  51 y.o. female with redness, irritation, itching and  burning of bilateral axilla and pubic area s/p shaving stable for d/c without fever or red streaking and does not appear toxic. Will treat for possible early infection and skin irritation. Patient to f/u with dermatology if symptoms persist. Final diagnoses:  Allergic reaction, initial encounter  Folliculitis    New Prescriptions Discharge Medication List as of 05/12/2017 11:30 PM    START taking these medications   Details  cephALEXin (KEFLEX) 500 MG capsule Take 1 capsule (500 mg total) by mouth 4 (four) times daily., Starting Sat 05/12/2017, Print    hydrOXYzine (ATARAX/VISTARIL) 25 MG tablet Take 1 tablet (25 mg total) by mouth every 6 (six) hours., Starting Sat 05/12/2017, Print    nystatin-triamcinolone (MYCOLOG II) cream Apply to affected area daily, Print    predniSONE (DELTASONE) 10 MG tablet  Take 2 tablets (20 mg total) by mouth 2 (two) times daily with a meal., Starting Sat 05/12/2017, Print       I personally performed the services described in this documentation, which was scribed in my presence. The recorded information has been reviewed and is accurate.    Debroah Baller [place], [place] 05/13/17 1810    Charlesetta Shanks, MD 05/14/17 0010

## 2017-05-12 NOTE — Discharge Instructions (Signed)
The medication for itching and burning can make you sleepy. Take it at night to see how it affects you. If your rash does not improve over the next few days follow up with Dr. Nevada Crane.

## 2017-05-17 ENCOUNTER — Encounter: Payer: Self-pay | Admitting: Family Medicine

## 2017-05-17 ENCOUNTER — Ambulatory Visit (INDEPENDENT_AMBULATORY_CARE_PROVIDER_SITE_OTHER): Payer: BLUE CROSS/BLUE SHIELD | Admitting: Family Medicine

## 2017-05-17 VITALS — BP 130/90 | HR 59 | Temp 97.9°F | Resp 16 | Wt 127.4 lb

## 2017-05-17 DIAGNOSIS — R609 Edema, unspecified: Secondary | ICD-10-CM | POA: Diagnosis not present

## 2017-05-17 NOTE — Progress Notes (Signed)
   Subjective:    Patient ID: Amanda Rich, female    DOB: 1966-07-12, 51 y.o.   MRN: [id number]  HPI Chief Complaint  Patient presents with  . leg swelling and fluid    leg swelling and fluid built up on leg   She is here with complaints of swelling to her lower extremities since yesterday. She has been taking prednisone for an allergic reaction and completed the course yesterday. States her face was puffy yesterday but that has improved.   States she went to the ED 5 days ago for lip swelling and itching after starting on Clarithromycin for folliculitis last week. They diagnosed her with an allergic reaction to the razors she was using in her axillae and pelvic area and a possible reaction to Clarythromycin.  They put her on prednisone and she finished the course yesterday. They also prescribed hydroxyzine and Keflex.  She is also using a topical cream for the folliculitis and this is helping.    Denies fever, chills, dizziness, chest pain, palpitations, cough, shortness of breath, orthopnea, abdominal pain, back pain, N/V/D.   She is happy that she has been gaining weight. States she has been eating more calories and protein. She has felt that she was too thin and wanted to put on weight.   Reviewed allergies, medications, past medical, surgical,  and social history.   Review of Systems Pertinent positives and negatives in the history of present illness.     Objective:   Physical Exam  Constitutional: She is oriented to person, place, and time. She appears well-developed and well-nourished. No distress.  HENT:  Mouth/Throat: Uvula is midline, oropharynx is clear and moist and mucous membranes are normal.  Eyes: Conjunctivae and lids are normal. Pupils are equal, round, and reactive to light.  Neck: Full passive range of motion without pain. Neck supple. No JVD present. No thyromegaly present.  Cardiovascular: Normal rate, regular rhythm, normal heart sounds and normal pulses.   Exam reveals no gallop and no friction rub.   No murmur heard. Mild edema, non pitting to bilateral lower extremities  Pulmonary/Chest: Effort normal and breath sounds normal.  Lymphadenopathy:    She has no cervical adenopathy.  Neurological: She is alert and oriented to person, place, and time. She has normal strength.  Skin: Skin is warm and dry. No rash noted. No pallor.  Psychiatric: She has a normal mood and affect. Her speech is normal and behavior is normal. Thought content normal.   BP 130/90   Pulse (!) 59   Temp 97.9 F (36.6 C) (Oral)   Resp 16   Wt 127 lb 6.4 oz (57.8 kg)   SpO2 98%   BMI 21.20 kg/m       Assessment & Plan:  Peripheral edema  Discussed that the most likely explanation for her LE edema and fluid retention is the course of prednisone she just finished. Discussed that her cardiopulmonary exam is benign. She will keep an eye on this and if edema gets worse over the weekend she will go to the ED. She will call me Tuesday and let me know how she is doing.

## 2017-05-17 NOTE — Patient Instructions (Addendum)
I think your swelling is related to the steroid. It should improve now that you are finished with it.  Elevate your legs when you can.  If you get worse or have any new symptoms let us know.  You can stop the Hydroxyzine. Finish the Keflex.  Call me Tuesday and let me know how you are doing.    Edema Edema is an abnormal buildup of fluids in your bodytissues. Edema is somewhatdependent on gravity to pull the fluid to the lowest place in your body. That makes the condition more common in the legs and thighs (lower extremities). Painless swelling of the feet and ankles is common and becomes more likely as you get older. It is also common in looser tissues, like around your eyes. When the affected area is squeezed, the fluid may move out of that spot and leave a dent for a few moments. This dent is called pitting. What are the causes? There are many possible causes of edema. Eating too much salt and being on your feet or sitting for a long time can cause edema in your legs and ankles. Hot weather may make edema worse. Common medical causes of edema include:  Heart failure.  Liver disease.  Kidney disease.  Weak blood vessels in your legs.  Cancer.  An injury.  Pregnancy.  Some medications.  Obesity. What are the signs or symptoms? Edema is usually painless.Your skin may look swollen or shiny. How is this diagnosed? Your health care provider may be able to diagnose edema by asking about your medical history and doing a physical exam. You may need to have tests such as X-rays, an electrocardiogram, or blood tests to check for medical conditions that may cause edema. How is this treated? Edema treatment depends on the cause. If you have heart, liver, or kidney disease, you need the treatment appropriate for these conditions. General treatment may include:  Elevation of the affected body part above the level of your heart.  Compression of the affected body part. Pressure from  elastic bandages or support stockings squeezes the tissues and forces fluid back into the blood vessels. This keeps fluid from entering the tissues.  Restriction of fluid and salt intake.  Use of a water pill (diuretic). These medications are appropriate only for some types of edema. They pull fluid out of your body and make you urinate more often. This gets rid of fluid and reduces swelling, but diuretics can have side effects. Only use diuretics as directed by your health care provider. Follow these instructions at home:  Keep the affected body part above the level of your heart when you are lying down.  Do not sit still or stand for prolonged periods.  Do not put anything directly under your knees when lying down.  Do not wear constricting clothing or garters on your upper legs.  Exercise your legs to work the fluid back into your blood vessels. This may help the swelling go down.  Wear elastic bandages or support stockings to reduce ankle swelling as directed by your health care provider.  Eat a low-salt diet to reduce fluid if your health care provider recommends it.  Only take medicines as directed by your health care provider. Contact a health care provider if:  Your edema is not responding to treatment.  You have heart, liver, or kidney disease and notice symptoms of edema.  You have edema in your legs that does not improve after elevating them.  You have sudden and unexplained  weight gain. Get help right away if:  You develop shortness of breath or chest pain.  You cannot breathe when you lie down.  You develop pain, redness, or warmth in the swollen areas.  You have heart, liver, or kidney disease and suddenly get edema.  You have a fever and your symptoms suddenly get worse. This information is not intended to replace advice given to you by your health care provider. Make sure you discuss any questions you have with your health care provider. Document Released:  12/11/2005 Document Revised: 05/18/2016 Document Reviewed: 10/03/2013 Elsevier Interactive Patient Education  2017 [employer].

## 2017-05-30 NOTE — Telephone Encounter (Signed)
This encounter was created in error - please disregard.

## 2017-09-24 ENCOUNTER — Encounter: Payer: Self-pay | Admitting: Family Medicine

## 2017-09-24 ENCOUNTER — Encounter: Payer: Self-pay | Admitting: Gastroenterology

## 2017-09-24 ENCOUNTER — Ambulatory Visit (INDEPENDENT_AMBULATORY_CARE_PROVIDER_SITE_OTHER): Payer: BLUE CROSS/BLUE SHIELD | Admitting: Family Medicine

## 2017-09-24 VITALS — BP 120/70 | HR 64 | Ht 65.0 in | Wt 112.8 lb

## 2017-09-24 DIAGNOSIS — N921 Excessive and frequent menstruation with irregular cycle: Secondary | ICD-10-CM | POA: Diagnosis not present

## 2017-09-24 DIAGNOSIS — E782 Mixed hyperlipidemia: Secondary | ICD-10-CM | POA: Diagnosis not present

## 2017-09-24 DIAGNOSIS — F329 Major depressive disorder, single episode, unspecified: Secondary | ICD-10-CM | POA: Diagnosis not present

## 2017-09-24 DIAGNOSIS — Z800 Family history of malignant neoplasm of digestive organs: Secondary | ICD-10-CM | POA: Diagnosis not present

## 2017-09-24 DIAGNOSIS — F17200 Nicotine dependence, unspecified, uncomplicated: Secondary | ICD-10-CM

## 2017-09-24 DIAGNOSIS — Z0000 Encounter for general adult medical examination without abnormal findings: Secondary | ICD-10-CM

## 2017-09-24 DIAGNOSIS — Z1211 Encounter for screening for malignant neoplasm of colon: Secondary | ICD-10-CM | POA: Diagnosis not present

## 2017-09-24 DIAGNOSIS — F32A Depression, unspecified: Secondary | ICD-10-CM

## 2017-09-24 LAB — POCT URINALYSIS DIP (PROADVANTAGE DEVICE)
Bilirubin, UA: NEGATIVE
Blood, UA: NEGATIVE
Glucose, UA: NEGATIVE mg/dL
Ketones, POC UA: NEGATIVE mg/dL
Leukocytes, UA: NEGATIVE
Nitrite, UA: NEGATIVE
Protein Ur, POC: NEGATIVE mg/dL
Specific Gravity, Urine: 1.015
Urobilinogen, Ur: NEGATIVE
pH, UA: 6 (ref 5.0–8.0)

## 2017-09-24 NOTE — Patient Instructions (Addendum)
Call and schedule your mammogram.  Call and schedule a dental cleaning and exam.  Start using My Fitness Pal again to make sure you are getting adequate calories.  Stop drinking energy drinks.  Stop smoking.  Make sure you are getting 1,200 mg of calcium in your diet and 800 IU of vitamin D daily.   Return for your flu shot or get this somewhere.   We will call you with your results.    Preventative Care for Adults - Female      MAINTAIN REGULAR HEALTH EXAMS:  A routine yearly physical is a good way to check in with your primary care provider about your health and preventive screening. It is also an opportunity to share updates about your health and any concerns you have, and receive a thorough all-over exam.   Most health insurance companies pay for at least some preventative services.  Check with your health plan for specific coverages.  WHAT PREVENTATIVE SERVICES DO WOMEN NEED?  Adult women should have their weight and blood pressure checked regularly.   Women age 60 and older should have their cholesterol levels checked regularly.  Women should be screened for cervical cancer with a Pap smear and pelvic exam beginning at either age 39, or 3 years after they become sexually activity.    Breast cancer screening generally begins at age 70 with a mammogram and breast exam by your primary care provider.    Beginning at age 23 and continuing to age 46, women should be screened for colorectal cancer.  Certain people may need continued testing until age 53.  Updating vaccinations is part of preventative care.  Vaccinations help protect against diseases such as the flu.  Osteoporosis is a disease in which the bones lose minerals and strength as we age. Women ages 16 and over should discuss this with their caregivers, as should women after menopause who have other risk factors.  Lab tests are generally done as part of preventative care to screen for anemia and blood disorders, to screen  for problems with the kidneys and liver, to screen for bladder problems, to check blood sugar, and to check your cholesterol level.  Preventative services generally include counseling about diet, exercise, avoiding tobacco, drugs, excessive alcohol consumption, and sexually transmitted infections.    GENERAL RECOMMENDATIONS FOR GOOD HEALTH:  Healthy diet:  Eat a variety of foods, including fruit, vegetables, animal or vegetable protein, such as meat, fish, chicken, and eggs, or beans, lentils, tofu, and grains, such as rice.  Drink plenty of water daily.  Decrease saturated fat in the diet, avoid lots of red meat, processed foods, sweets, fast foods, and fried foods.  Exercise:  Aerobic exercise helps maintain good heart health. At least 30-40 minutes of moderate-intensity exercise is recommended. For example, a brisk walk that increases your heart rate and breathing. This should be done on most days of the week.   Find a type of exercise or a variety of exercises that you enjoy so that it becomes a part of your daily life.  Examples are running, walking, swimming, water aerobics, and biking.  For motivation and support, explore group exercise such as aerobic class, spin class, Zumba, Yoga,or  martial arts, etc.    Set exercise goals for yourself, such as a certain weight goal, walk or run in a race such as a 5k walk/run.  Speak to your primary care provider about exercise goals.  Disease prevention:  If you smoke or chew tobacco, find out  from your caregiver how to quit. It can literally save your life, no matter how long you have been a tobacco user. If you do not use tobacco, never begin.   Maintain a healthy diet and normal weight. Increased weight leads to problems with blood pressure and diabetes.   The Body Mass Index or BMI is a way of measuring how much of your body is fat. Having a BMI above 27 increases the risk of heart disease, diabetes, hypertension, stroke and other  problems related to obesity. Your caregiver can help determine your BMI and based on it develop an exercise and dietary program to help you achieve or maintain this important measurement at a healthful level.  High blood pressure causes heart and blood vessel problems.  Persistent high blood pressure should be treated with medicine if weight loss and exercise do not work.   Fat and cholesterol leaves deposits in your arteries that can block them. This causes heart disease and vessel disease elsewhere in your body.  If your cholesterol is found to be high, or if you have heart disease or certain other medical conditions, then you may need to have your cholesterol monitored frequently and be treated with medication.   Ask if you should have a cardiac stress test if your history suggests this. A stress test is a test done on a treadmill that looks for heart disease. This test can find disease prior to there being a problem.  Menopause can be associated with physical symptoms and risks. Hormone replacement therapy is available to decrease these. You should talk to your caregiver about whether starting or continuing to take hormones is right for you.   Osteoporosis is a disease in which the bones lose minerals and strength as we age. This can result in serious bone fractures. Risk of osteoporosis can be identified using a bone density scan. Women ages 19 and over should discuss this with their caregivers, as should women after menopause who have other risk factors. Ask your caregiver whether you should be taking a calcium supplement and Vitamin D, to reduce the rate of osteoporosis.   Avoid drinking alcohol in excess (more than two drinks per day).  Avoid use of street drugs. Do not share needles with anyone. Ask for professional help if you need assistance or instructions on stopping the use of alcohol, cigarettes, and/or drugs.  Brush your teeth twice a day with fluoride toothpaste, and floss once a day.  Good oral hygiene prevents tooth decay and gum disease. The problems can be painful, unattractive, and can cause other health problems. Visit your dentist for a routine oral and dental check up and preventive care every 6-12 months.   Look at your skin regularly.  Use a mirror to look at your back. Notify your caregivers of changes in moles, especially if there are changes in shapes, colors, a size larger than a pencil eraser, an irregular border, or development of new moles.  Safety:  Use seatbelts 100% of the time, whether driving or as a passenger.  Use safety devices such as hearing protection if you work in environments with loud noise or significant background noise.  Use safety glasses when doing any work that could send debris in to the eyes.  Use a helmet if you ride a bike or motorcycle.  Use appropriate safety gear for contact sports.  Talk to your caregiver about gun safety.  Use sunscreen with a SPF (or skin protection factor) of 15 or greater.  Lighter  skinned people are at a greater risk of skin cancer. Don't forget to also wear sunglasses in order to protect your eyes from too much damaging sunlight. Damaging sunlight can accelerate cataract formation.   Practice safe sex. Use condoms. Condoms are used for birth control and to help reduce the spread of sexually transmitted infections (or STIs).  Some of the STIs are gonorrhea (the clap), chlamydia, syphilis, trichomonas, herpes, HPV (human papilloma virus) and HIV (human immunodeficiency virus) which causes AIDS. The herpes, HIV and HPV are viral illnesses that have no cure. These can result in disability, cancer and death.   Keep carbon monoxide and smoke detectors in your home functioning at all times. Change the batteries every 6 months or use a model that plugs into the wall.   Vaccinations:  Stay up to date with your tetanus shots and other required immunizations. You should have a booster for tetanus every 10 years. Be sure to  get your flu shot every year, since 5%-20% of the [place] population comes down with the flu. The flu vaccine changes each year, so being vaccinated once is not enough. Get your shot in the fall, before the flu season peaks.   Other vaccines to consider:  Human Papilloma Virus or HPV causes cancer of the cervix, and other infections that can be transmitted from person to person. There is a vaccine for HPV, and females should get immunized between the ages of 5 and 4. It requires a series of 3 shots.   Pneumococcal vaccine to protect against certain types of pneumonia.  This is normally recommended for adults age 96 or older.  However, adults younger than 49 years old with certain underlying conditions such as diabetes, heart or lung disease should also receive the vaccine.  Shingles vaccine to protect against Varicella Zoster if you are older than age 41, or younger than 49 years old with certain underlying illness.  Hepatitis A vaccine to protect against a form of infection of the liver by a virus acquired from food.  Hepatitis B vaccine to protect against a form of infection of the liver by a virus acquired from blood or body fluids, particularly if you work in health care.  If you plan to travel internationally, check with your local health department for specific vaccination recommendations.  Cancer Screening:  Breast cancer screening is essential to preventive care for women. All women age 5 and older should perform a breast self-exam every month. At age 73 and older, women should have their caregiver complete a breast exam each year. Women at ages 40 and older should have a mammogram (x-ray film) of the breasts. Your caregiver can discuss how often you need mammograms.    Cervical cancer screening includes taking a Pap smear (sample of cells examined under a microscope) from the cervix (end of the uterus). It also includes testing for HPV (Human Papilloma Virus, which can cause cervical  cancer). Screening and a pelvic exam should begin at age 60, or 3 years after a woman becomes sexually active. Screening should occur every year, with a Pap smear but no HPV testing, up to age 46. After age 70, you should have a Pap smear every 3 years with HPV testing, if no HPV was found previously.   Most routine colon cancer screening begins at the age of 40. On a yearly basis, doctors may provide special easy to use take-home tests to check for hidden blood in the stool. Sigmoidoscopy or colonoscopy can detect the earliest  forms of colon cancer and is life saving. These tests use a small camera at the end of a tube to directly examine the colon. Speak to your caregiver about this at age 63, when routine screening begins (and is repeated every 5 years unless early forms of pre-cancerous polyps or small growths are found).

## 2017-09-24 NOTE — Progress Notes (Signed)
Subjective:    Patient ID: Amanda Rich, female    DOB: 06/07/1966, 51 y.o.   MRN: [id number]  HPI Chief Complaint  Patient presents with  . fasting cpe    fasting cpe, no other concerns, declines flu shot, eye exam done in September 2018   She is here for a complete physical exam.  States her stepfather passed away in 08-18-23 and she has been feeling depressed. Appetite was poor for a few weeks but is getting better. States her mood is much better.  She wants to change jobs. Does not want to start on medication or counseling at this time.   Other providers: Dr. Collene Mares- GI. Dr. Luna Glasgow- eyes  Smoking again, she just started back and plans to start using nicotine patches.   Social history: Lives with her fiance, works at [employer].   Diet: she is not eating healthy foods. Drinks ensure. Drinks Hospital doctor energy drinks at work.  Excerise: nothing particular. She walks her new dog.   Left ulnar wrist with abrasion from scratching her arm on brick. She has been using peroxide and neosporin.   Immunizations: Tdap up to date   Health maintenance:  Mammogram: October 2017. Is due for this.  Colonoscopy: never.  Last Gynecological Exam: 08/2016 and normal pap, negative  Last Menstrual cycle: last month 3 days of bleeding. Periods are irregular. She went 8 months without any bleeding and then her cycles started back.  DEXA in 2010. Cannot find results in chart. She had this due to Depo long term use.  Pregnancies: 3 Last Dental Exam: 2 years ago. Top partial.  Last Eye Exam: 08/2017  Depression screen [facility] 2/9 09/24/2017 09/22/2016  Decreased Interest 0 1  Down, Depressed, Hopeless 0 1  PHQ - 2 Score 0 2  Altered sleeping - 1  Tired, decreased energy - 1  Change in appetite - 1  Feeling bad or failure about yourself  - 0  Trouble concentrating - 0  Moving slowly or fidgety/restless - 1  Suicidal thoughts - 0  PHQ-9 Score - 6    Wears seatbelt always, smoke detectors in  home and functioning, does not text while driving and feels safe in home environment.   Reviewed allergies, medications, past medical, surgical, family, and social history.   Review of Systems Review of Systems Constitutional: -fever, -chills, -sweats, -unexpected weight change,-fatigue ENT: -runny nose, -ear pain, -sore throat Cardiology:  -chest pain, -palpitations, -edema Respiratory: -cough, -shortness of breath, -wheezing Gastroenterology: -abdominal pain, -nausea, -vomiting, -diarrhea, -constipation  Hematology: -bleeding or bruising problems Musculoskeletal: -arthralgias, -myalgias, -joint swelling, -back pain Ophthalmology: -vision changes Urology: -dysuria, -difficulty urinating, -hematuria, -urinary frequency, -urgency Neurology: -headache, -weakness, -tingling, -numbness       Objective:   Physical Exam BP 120/70   Pulse 64   Ht 5\' 5"  (1.651 m)   Wt 112 lb 12.8 oz (51.2 kg)   LMP 09/10/2017   BMI 18.77 kg/m   General Appearance:    Alert, cooperative, no distress, appears stated age  Head:    Normocephalic, without obvious abnormality, atraumatic  Eyes:    PERRL, conjunctiva/corneas clear, EOM's intact, fundi    benign  Ears:    Normal TM's and external ear canals  Nose:   Nares normal, mucosa normal, no drainage or sinus   tenderness  Throat:   Lips, mucosa, and tongue normal; teeth and gums normal  Neck:   Supple, no lymphadenopathy;  thyroid:  no   enlargement/tenderness/nodules; no carotid  bruit or JVD  Back:    Spine nontender, no curvature, ROM normal, no CVA     tenderness  Lungs:     Clear to auscultation bilaterally without wheezes, rales or     ronchi; respirations unlabored  Chest Wall:    No tenderness or deformity   Heart:    Regular rate and rhythm, S1 and S2 normal, no murmur, rub   or gallop  Breast Exam:    No tenderness, masses, or nipple discharge or inversion.      No axillary lymphadenopathy  Abdomen:     Soft, non-tender, nondistended,  normoactive bowel sounds,    no masses, no hepatosplenomegaly  Genitalia:    Declines.      Extremities:   No clubbing, cyanosis or edema  Pulses:   2+ and symmetric all extremities  Skin:   Skin color, texture, turgor normal, no rashes. 1.5 in x 0.5 in oval abrasion to ulnar aspect of her left arm. No surrounding erythema, edema or drainage.   Lymph nodes:   Cervical, supraclavicular, and axillary nodes normal  Neurologic:   CNII-XII intact, normal strength, sensation and gait; reflexes 2+ and symmetric throughout          Psych:   Normal mood, affect, hygiene and grooming.    Urinalysis dipstick: negative      Assessment & Plan:  Routine general medical examination at a health care facility - Plan: POCT Urinalysis DIP (Proadvantage Device), TSH, Lipid panel, CBC with Differential/Platelet, Comprehensive metabolic panel  Mixed hyperlipidemia - Plan: Lipid panel  Smoker  Depression, unspecified depression type  Irregular intermenstrual bleeding - Plan: FSH/LH, Estrogens, Total  Family history of colon cancer - Plan: Ambulatory referral to Gastroenterology  Screen for colon cancer - Plan: Ambulatory referral to Gastroenterology  She declines medication or counseling for depression but will let me know if she changes her mind.  Recommend she stop smoking.  She appears to be perimenopausal based on irregular menstrual cycles and will check labs.  Per patient request, I am referring her to GI for screening colonoscopy.  She will call to schedule her mammogram.  Reviewed bone density from 2010 and it was normal.  Follow up pending labs.  Check fasting lipids.

## 2017-10-02 LAB — COMPREHENSIVE METABOLIC PANEL
AG Ratio: 1.8 (calc) (ref 1.0–2.5)
ALT: 10 U/L (ref 6–29)
AST: 16 U/L (ref 10–35)
Albumin: 5.1 g/dL (ref 3.6–5.1)
Alkaline phosphatase (APISO): 44 U/L (ref 33–115)
BUN: 9 mg/dL (ref 7–25)
CO2: 24 mmol/L (ref 20–32)
Calcium: 9.9 mg/dL (ref 8.6–10.2)
Chloride: 104 mmol/L (ref 98–110)
Creat: 0.68 mg/dL (ref 0.50–1.10)
Globulin: 2.8 g/dL (calc) (ref 1.9–3.7)
Glucose, Bld: 96 mg/dL (ref 65–99)
Potassium: 4 mmol/L (ref 3.5–5.3)
Sodium: 138 mmol/L (ref 135–146)
Total Bilirubin: 0.5 mg/dL (ref 0.2–1.2)
Total Protein: 7.9 g/dL (ref 6.1–8.1)

## 2017-10-02 LAB — CBC WITH DIFFERENTIAL/PLATELET
Basophils Absolute: 63 cells/uL (ref 0–200)
Basophils Relative: 0.8 %
Eosinophils Absolute: 190 cells/uL (ref 15–500)
Eosinophils Relative: 2.4 %
HCT: 41.3 % (ref 35.0–45.0)
Hemoglobin: 13.4 g/dL (ref 11.7–15.5)
Lymphs Abs: 3579 cells/uL (ref 850–3900)
MCH: 28.5 pg (ref 27.0–33.0)
MCHC: 32.4 g/dL (ref 32.0–36.0)
MCV: 87.9 fL (ref 80.0–100.0)
MPV: 10.6 fL (ref 7.5–12.5)
Monocytes Relative: 6.1 %
Neutro Abs: 3587 cells/uL (ref 1500–7800)
Neutrophils Relative %: 45.4 %
Platelets: 227 10*3/uL (ref 140–400)
RBC: 4.7 10*6/uL (ref 3.80–5.10)
RDW: 11.8 % (ref 11.0–15.0)
Total Lymphocyte: 45.3 %
WBC mixed population: 482 cells/uL (ref 200–950)
WBC: 7.9 10*3/uL (ref 3.8–10.8)

## 2017-10-02 LAB — FSH/LH
FSH: 8.5 m[IU]/mL
LH: 3.5 m[IU]/mL

## 2017-10-02 LAB — LIPID PANEL
Cholesterol: 145 mg/dL (ref <200)
HDL: 58 mg/dL (ref >50)
LDL Cholesterol (Calc): 73 mg/dL (calc)
Non-HDL Cholesterol (Calc): 87 mg/dL (calc) (ref <130)
Total CHOL/HDL Ratio: 2.5 (calc) (ref <5.0)
Triglycerides: 62 mg/dL (ref <150)

## 2017-10-02 LAB — ESTROGENS, TOTAL: Estrogen: 326.1 pg/mL

## 2017-10-02 LAB — TSH: TSH: 1.76 mIU/L

## 2017-10-08 ENCOUNTER — Encounter: Payer: Self-pay | Admitting: Family Medicine

## 2017-11-19 ENCOUNTER — Telehealth: Payer: Self-pay | Admitting: Gastroenterology

## 2017-11-19 ENCOUNTER — Ambulatory Visit: Payer: BLUE CROSS/BLUE SHIELD | Admitting: Gastroenterology

## 2017-11-19 NOTE — Telephone Encounter (Signed)
No charge. 

## 2017-12-06 ENCOUNTER — Other Ambulatory Visit: Payer: Self-pay | Admitting: Family Medicine

## 2017-12-06 DIAGNOSIS — Z1231 Encounter for screening mammogram for malignant neoplasm of breast: Secondary | ICD-10-CM

## 2018-01-02 ENCOUNTER — Ambulatory Visit
Admission: RE | Admit: 2018-01-02 | Discharge: 2018-01-02 | Disposition: A | Discharge status: Home / Self Care | Payer: BLUE CROSS/BLUE SHIELD | Source: Ambulatory Visit | Attending: Family Medicine | Admitting: Family Medicine

## 2018-01-02 DIAGNOSIS — Z1231 Encounter for screening mammogram for malignant neoplasm of breast: Secondary | ICD-10-CM | POA: Diagnosis not present

## 2018-01-03 ENCOUNTER — Ambulatory Visit: Payer: BLUE CROSS/BLUE SHIELD | Admitting: Family Medicine

## 2018-01-04 ENCOUNTER — Ambulatory Visit: Payer: BLUE CROSS/BLUE SHIELD | Admitting: Family Medicine

## 2018-01-08 ENCOUNTER — Encounter: Payer: Self-pay | Admitting: Gastroenterology

## 2018-01-08 ENCOUNTER — Ambulatory Visit: Payer: BLUE CROSS/BLUE SHIELD | Admitting: Gastroenterology

## 2018-01-08 ENCOUNTER — Telehealth: Payer: Self-pay | Admitting: Gastroenterology

## 2018-01-08 NOTE — Telephone Encounter (Signed)
Do not bill 

## 2018-01-21 ENCOUNTER — Ambulatory Visit: Payer: BLUE CROSS/BLUE SHIELD | Admitting: Family Medicine

## 2018-01-21 ENCOUNTER — Encounter: Payer: Self-pay | Admitting: Family Medicine

## 2018-01-21 VITALS — BP 110/70 | HR 61 | Ht 65.0 in | Wt 116.2 lb

## 2018-01-21 DIAGNOSIS — F32A Depression, unspecified: Secondary | ICD-10-CM

## 2018-01-21 DIAGNOSIS — R634 Abnormal weight loss: Secondary | ICD-10-CM | POA: Diagnosis not present

## 2018-01-21 DIAGNOSIS — N921 Excessive and frequent menstruation with irregular cycle: Secondary | ICD-10-CM

## 2018-01-21 DIAGNOSIS — Z9889 Other specified postprocedural states: Secondary | ICD-10-CM | POA: Diagnosis not present

## 2018-01-21 DIAGNOSIS — F17200 Nicotine dependence, unspecified, uncomplicated: Secondary | ICD-10-CM

## 2018-01-21 DIAGNOSIS — F329 Major depressive disorder, single episode, unspecified: Secondary | ICD-10-CM

## 2018-01-21 DIAGNOSIS — E639 Nutritional deficiency, unspecified: Secondary | ICD-10-CM

## 2018-01-21 NOTE — Patient Instructions (Addendum)
Call and schedule with an OB/GYN for further evaluation of your irregular menses.   Obgyn Offices:   [facility] [address] [place], [place] [phone]  [facility] Address: [address]                   [place], [place] [address] Phone: [phone]  [place] [address] [place], [place] [address] Phone: [phone]  [place],  [address] Phone: [phone]

## 2018-01-21 NOTE — Progress Notes (Signed)
Subjective:    Patient ID: Amanda Rich, female    DOB: 1966/08/07, 52 y.o.   MRN: [id number]  HPI Chief Complaint  Patient presents with  . 3 month follow-up    3 month follow-up on weight and periods   She is here to follow up on weight loss, unintentional and irregular periods.   She has gained 3 lbs. reports having increased appetite but she has recently been eating a diet that is high in fat, carbs and sugars.  States she has been eating a lot of ice cream, potato chips, positive, peanut butter.  States she occasionally has a bedtime snack that consists of 4 donuts and 2 glasses of milk.  States she has been cutting back on smoking. Smoked 1 cigarette in the past 2 weeks.   Her brother passed away the end of 20-Jan-2024 from a seizure disorder.  Reports having a strong family network and is doing okay.  She questions whether she could have bipolar disorder stating that she occasionally has a really good days and another day she feels depressed.  Denies suicidal or homicidal ideation. States she had a couple of days where she ordered a lot of things on line and was doing some gambling and lost approximately $200.  States she called and went to a gambling addiction support group and has not had any issues with this since.  States her periods have been irregular for quite some time.  States she has 2 or 3 periods per year.  States she was told that she should no longer have periods after the endometrial ablation in 2012.  She has not followed up with OB/GYN since then. States the last OB/GYN she saw was Amanda Rich in [place].   No other complaints or concerns today.  Denies fever, chills, dizziness, chest pain, palpitations, shortness of breath, abdominal pain, N/V/D, urinary symptoms, LE edema.   Reviewed allergies, medications, past medical, surgical, family, and social history.   Review of Systems Pertinent positives and negatives in the history of present illness.       Objective:   Physical Exam BP 110/70   Pulse 61   Ht 5\' 5"  (1.651 m)   Wt 116 lb 3.2 oz (52.7 kg)   LMP 10/15/2017 (Approximate)   BMI 19.34 kg/m   Alert oriented and in no acute distress.  Not otherwise examined.      Assessment & Plan:  Irregular intermenstrual bleeding  Weight loss, abnormal  Smoker  Poor diet  Depression, unspecified depression type  History of endometrial ablation  Discussed that labs from October did not indicate that she is menopausal.  Plan to send her to OB/GYN for further evaluation of the regular menses and history of endometrial ablation. She has gained weight, approximately 3 pounds, but she has been eating diet high in fat, carbs and calories.  Discussed healthy food options and avoiding fad diets. She continues to work on smoking and is now smoking only 1-2 cigarettes every couple of weeks.  Encouraged her to continue with smoking cessation. She appears to be doing fine with her mood.  Recent loss of her brother in 20-Jan-2024 has made her somewhat more depressed but overall she feels like she is having good days and bad days.  She apparently was having issues with gambling but she quickly found a support group and denies any further issues with this. Recommend seeing a therapist if she continues having impulsive thoughts or behavior.  She will follow-up with me  in 6 weeks or sooner if needed.

## 2018-01-30 ENCOUNTER — Ambulatory Visit: Payer: BLUE CROSS/BLUE SHIELD | Admitting: Medical

## 2018-01-30 ENCOUNTER — Encounter: Payer: Self-pay | Admitting: Medical

## 2018-01-30 VITALS — BP 112/68 | HR 65 | Wt 115.0 lb

## 2018-01-30 DIAGNOSIS — K115 Sialolithiasis: Secondary | ICD-10-CM | POA: Diagnosis not present

## 2018-01-30 DIAGNOSIS — R220 Localized swelling, mass and lump, head: Secondary | ICD-10-CM

## 2018-01-30 NOTE — Progress Notes (Signed)
Subjective: Chief Complaint  Patient presents with  . swelling in jaw    spasms in jaw swelling on left side.   Awoke this morning with spasm in right jaw, fullness in jaw in front of ear, but sunken in feeling in side of mouth.   It has slowly improved as the day has went on.  Prior to day was in usual state of health.  No teeth pain, has dentures on top.  Denies pus, discharge, liquid or stone in mouth.  No fever, no weakness, no numbness, no tingling.   No other aggravating or relieving factors. No other complaint.  Past Medical History:  Diagnosis Date  . Anemia   . Depression   . Smoker    Current Outpatient Medications on File Prior to Visit  Medication Sig Dispense Refill  . aspirin 81 MG tablet Take 81 mg by mouth daily.    . Biotin 5000 MCG CAPS Take by mouth.    . Cholecalciferol (VITAMIN D3) 2000 units TABS Take by mouth.    . Multiple Vitamin (MULTIVITAMIN WITH MINERALS) TABS Take 1 tablet by mouth daily.    . nicotine (NICODERM CQ - DOSED IN MG/24 HOURS) 21 mg/24hr patch Place 21 mg onto the skin daily.    . TURMERIC PO Take by mouth.     No current facility-administered medications on file prior to visit.    ROS as in subjective   Objective: BP 112/68   Pulse 65   Wt 115 lb (52.2 kg)   SpO2 99%   BMI 19.14 kg/m   General well-developed well-nourished no acute distress Skin no obvious erythema or bruising on the right face There is a small solid stone sticking out of the right parotid duct along the buccal mucosa, otherwise no tooth decay no obvious lesions in the mouth no lump Face otherwise normocephalic, no asymmetry no swelling no mass no lymphadenopathy no thyromegaly Neck supple non tender no lymphadenopathy TMs pearly, canals normal no other deformity of the face or ears or neck She has an upper denture thought    Assessment: Encounter Diagnoses  Name Primary?  . Calculus of parotid gland Yes  . Jaw swelling      Plan: Discussed symptoms and  findings suggested of stone blocking parotid duct.  There was a small piece of stone hanging out of the duct on the right inside the mouth.  I teased this out with a swab.  Advised sour candy or lemon drops today and the more hydrate well.  Reassured.  Follow-up as needed  Aniella was seen today for swelling in jaw.  Diagnoses and all orders for this visit:  Calculus of parotid gland  Jaw swelling

## 2018-02-15 ENCOUNTER — Ambulatory Visit: Payer: BLUE CROSS/BLUE SHIELD | Admitting: Gastroenterology

## 2018-02-15 ENCOUNTER — Encounter: Payer: Self-pay | Admitting: Gastroenterology

## 2018-02-15 VITALS — BP 100/60 | HR 64 | Ht 65.0 in | Wt 116.0 lb

## 2018-02-15 DIAGNOSIS — R109 Unspecified abdominal pain: Secondary | ICD-10-CM

## 2018-02-15 DIAGNOSIS — R634 Abnormal weight loss: Secondary | ICD-10-CM | POA: Diagnosis not present

## 2018-02-15 MED ORDER — PEG 3350-KCL-NA BICARB-NACL 420 G PO SOLR: 4000.0000 mL | Freq: Once | ORAL | 0 refills | Status: AC

## 2018-02-15 NOTE — Progress Notes (Signed)
HPI: This is a   very pleasant 52 year old woman who was referred to me by Harland Dingwall L, NP-C  to evaluate abdominal pain, weight loss.    Chief complaint is abdominal pain, weight loss   125-135 normal weight: currently 116.  Gradually has lost 10-20 pounds.  Had normal mammogram recently  Became depressed after mother passing 4 yeas ago but ate well.  She is currently trying to quit smoking  She works 3rd shift.  Eats one full meal per day.  Has epigastric pain after most meals.  Early satiey. Has pain with eating.  No associated nausea or vomiting.  Two years of this pain.  She takes goody powder, about 1-2 times per month.  No pyrosis.  Her mother had IBS.  Never had colon cancer screening.  Old Data Reviewed:  Lab testing January 2018 shows fecal occult blood test was negative x3 CBC October 2018 was normal   CT scan abdomen pelvis 2016 showed some haziness in her left upper quadrant. Ultrasound 2017 showed normal gallbladder normal liver HIDA scan 2017 showed normal gallbladder ejection fraction      Review of systems: Pertinent positive and negative review of systems were noted in the above HPI section. All other review negative.   Past Medical History:  Diagnosis Date  . Anemia   . Depression   . Smoker     Past Surgical History:  Procedure Laterality Date  . ABLATION ON ENDOMETRIOSIS    . GANGLION CYST EXCISION  2007   L wrist; [facility], [place]  . TUBAL LIGATION     [facility]  . UMBILICAL HERNIA REPAIR  2005   [facility]    Current Outpatient Medications  Medication Sig Dispense Refill  . aspirin 81 MG tablet Take 81 mg by mouth daily.    . Biotin 5000 MCG CAPS Take by mouth.    . Cholecalciferol (VITAMIN D3) 2000 units TABS Take by mouth.    . Multiple Vitamin (MULTIVITAMIN WITH MINERALS) TABS Take 1 tablet by mouth daily.    . nicotine (NICODERM CQ - DOSED IN MG/24 HOURS) 21 mg/24hr patch Place 21 mg onto the skin daily.    . TURMERIC PO Take by  mouth.     No current facility-administered medications for this visit.     Allergies as of 02/15/2018 - Review Complete 02/15/2018  Allergen Reaction Noted  . Clarithromycin Swelling 05/17/2017    Family History  Problem Relation Age of Onset  . Diabetes Mother   . Hypertension Mother   . Liver cancer Daughter   . Diabetes Maternal Aunt   . Colon cancer Maternal Uncle   . Colon cancer Cousin     Social History   Socioeconomic History  . Marital status: Legally Separated    Spouse name: Not on file  . Number of children: Not on file  . Years of education: Not on file  . Highest education level: Not on file  Social Needs  . Financial resource strain: Not on file  . Food insecurity - worry: Not on file  . Food insecurity - inability: Not on file  . Transportation needs - medical: Not on file  . Transportation needs - non-medical: Not on file  Occupational History  . Not on file  Tobacco Use  . Smoking status: Current Some Day Smoker    Packs/day: 1.00    Years: 25.00    Pack years: 25.00    Types: Cigarettes  . Smokeless tobacco: Never Used  .  Tobacco comment: smoking cigarettes again and not e-cigs  Substance and Sexual Activity  . Alcohol use: Yes    Alcohol/week: 0.6 oz    Types: 1 Glasses of wine per week  . Drug use: No  . Sexual activity: Not on file  Other Topics Concern  . Not on file  Social History Narrative  . Not on file     Physical Exam: BP 100/60   Pulse 64   Ht 5\' 5"  (1.651 m)   Wt 116 lb (52.6 kg)   BMI 19.30 kg/m  Constitutional: generally well-appearing Psychiatric: alert and oriented x3 Eyes: extraocular movements intact Mouth: oral pharynx moist, no lesions Neck: supple no lymphadenopathy Cardiovascular: heart regular rate and rhythm Lungs: clear to auscultation bilaterally Abdomen: soft, nontender, nondistended, no obvious ascites, no peritoneal signs, normal bowel sounds Extremities: no lower extremity edema  bilaterally Skin: no lesions on visible extremities   Assessment and plan: 53 y.o. female with postprandial abdominal pain, weight loss  For at least the past 2 or 3 years she has had postprandial abdominal pain and a gradual weight loss of 20-30 pounds over this time period.  She does have alternating bowel habits but that is been going on for many years.  She does not see any overt blood in her stool.  She had biliary workup about 2 years ago by a different gastroenterologist and that was essentially normal.  She has never had upper endoscopy or colonoscopy and I recommended both of those tests to begin the evaluation of her abdominal pain and her unexplained weight loss.  If neither of these tests show a clear explanation why she has any symptoms and I would probably continue to workup with CT scan abdomen and pelvis.    Please see the "Patient Instructions" section for addition details about the plan.   Owens Loffler, MD [facility] 02/15/2018, 10:28 AM  Cc: Henson, Vickie L, NP-C

## 2018-02-15 NOTE — Patient Instructions (Addendum)
You will be set up for a colonoscopy and EGD for abdominal pain, weight loss.  Normal BMI (Body Mass Index- based on height and weight) is between 19 and 25. Your BMI today is Body mass index is 19.3 kg/m. Amanda Rich Please consider follow up  regarding your BMI with your Primary Care Provider.

## 2018-03-04 ENCOUNTER — Encounter: Payer: Self-pay | Admitting: Family Medicine

## 2018-03-04 ENCOUNTER — Ambulatory Visit: Payer: BLUE CROSS/BLUE SHIELD | Admitting: Family Medicine

## 2018-03-04 VITALS — BP 110/70 | HR 69 | Ht 65.0 in | Wt 115.8 lb

## 2018-03-04 DIAGNOSIS — R634 Abnormal weight loss: Secondary | ICD-10-CM | POA: Diagnosis not present

## 2018-03-04 DIAGNOSIS — F17200 Nicotine dependence, unspecified, uncomplicated: Secondary | ICD-10-CM | POA: Diagnosis not present

## 2018-03-04 DIAGNOSIS — F329 Major depressive disorder, single episode, unspecified: Secondary | ICD-10-CM

## 2018-03-04 DIAGNOSIS — E639 Nutritional deficiency, unspecified: Secondary | ICD-10-CM

## 2018-03-04 DIAGNOSIS — F32A Depression, unspecified: Secondary | ICD-10-CM

## 2018-03-04 NOTE — Progress Notes (Signed)
   Subjective:    Patient ID: Amanda Rich, female    DOB: Feb 06, 1966, 52 y.o.   MRN: [id number]  HPI Chief Complaint  Patient presents with  . follow-up    follow-up weight and depression   She is here to follow up on weight loss and depression. She had unintentional weight loss but her diet was generally very poor in nutrients and calorie deficient. She added Boost Her weight has been stable most recently.  States she feels fine. She saw GI-Dr. Ardis Hughs. She is having an EDG and colonoscopy later this month.  Overall she feels like she is making progress with her health and emotional stability.  She recently ended her relationship with her boyfriend because she felt like it was unhealthy. She is getting new dentures and she recently obtained new prescription glasses.   States she is planning to schedule with her OB/GYN in a few weeks.   States she has cut back on smoking and using patches now.  States she is taking melatonin for sleep and doing well.  She does work third shift so has had difficulty sleeping due to her schedule.  She has not want to take medication for depression and this is still the case.  Denies fever, chills, dizziness, chest pain, palpitations, shortness of breath, abdominal pain, N/V/D, urinary symptoms, LE edema.     Depression screen [facility] 2/9 03/04/2018 09/24/2017 09/22/2016  Decreased Interest 0 0 1  Down, Depressed, Hopeless 1 0 1  PHQ - 2 Score 1 0 2  Altered sleeping - - 1  Tired, decreased energy - - 1  Change in appetite - - 1  Feeling bad or failure about yourself  - - 0  Trouble concentrating - - 0  Moving slowly or fidgety/restless - - 1  Suicidal thoughts - - 0  PHQ-9 Score - - 6    Reviewed allergies, medications, past medical, surgical, family, and social history.   Review of Systems Pertinent positives and negatives in the history of present illness.     Objective:   Physical Exam BP 110/70   Pulse 69   Ht 5\' 5"  (1.651 m)   Wt  115 lb 12.8 oz (52.5 kg)   BMI 19.27 kg/m   Alert and oriented in no acute distress.  Not otherwise examined.      Assessment & Plan:  Depression, unspecified depression type  Weight loss, abnormal  Smoker  Poor diet  Congratulated her on making healthy lifestyle changes such as cutting back on smoking and ending in unhealthy relationship. She is no longer feeling depressed.  Has a positive outlook. Her weight has been stable.  She will have an EGD and colonoscopy later this month. She would like to return in 3 months for follow-up.

## 2018-03-06 ENCOUNTER — Telehealth: Payer: Self-pay | Admitting: Family Medicine

## 2018-03-06 NOTE — Telephone Encounter (Signed)
New Message  Pt verbalized wanting a rx for her flu and would like to speak to nurse.  Please f/u

## 2018-03-06 NOTE — Telephone Encounter (Signed)
Did she have exposure to someone with a known flu diagnosis? How long ago was she exposed? Tamiflu will only work if she starts it within 48 hours of exposure for prevention and unless she has the flu I would not want to put her on this medication because it can have side effects that could make her feel even worse.  Also, the dosing is different based on taking it for prevention vs treatment of flu. She can come in for a flu test and to see me if she would like.

## 2018-03-06 NOTE — Telephone Encounter (Signed)
I looked in her medication history and I do not see a medication that even resembles the spelling of the cough syrup she is asking about. Please have her try and find the medication name and I can help her out.

## 2018-03-06 NOTE — Telephone Encounter (Signed)
Pt does not want to take tamiflu and would like an rx for alupent oral suspension for coughing. She has had this before and it helped alot

## 2018-03-06 NOTE — Telephone Encounter (Signed)
Pt has running nose, slight fever and achy. She feels like she has the flu and it has been less than 48 hours. Her co-workers have had the flu and she just wants something to help with it

## 2018-03-06 NOTE — Telephone Encounter (Signed)
Pt was notified that she should try otc mucinex and if she gets worse then she was advised to come in

## 2018-03-06 NOTE — Telephone Encounter (Signed)
Left message for pt to call me back  If less than 48 hours- per vickie ok to prescribe tamiflu 75mg  bid x 5 days. Let her know that she may experience side effects

## 2018-03-15 ENCOUNTER — Encounter: Payer: Self-pay | Admitting: Gastroenterology

## 2018-03-29 ENCOUNTER — Ambulatory Visit (AMBULATORY_SURGERY_CENTER): Payer: BLUE CROSS/BLUE SHIELD | Admitting: Gastroenterology

## 2018-03-29 ENCOUNTER — Other Ambulatory Visit: Payer: Self-pay

## 2018-03-29 ENCOUNTER — Encounter: Payer: Self-pay | Admitting: Gastroenterology

## 2018-03-29 VITALS — BP 136/92 | HR 81 | Temp 97.5°F | Resp 13 | Ht 65.0 in | Wt 116.0 lb

## 2018-03-29 DIAGNOSIS — D128 Benign neoplasm of rectum: Secondary | ICD-10-CM

## 2018-03-29 DIAGNOSIS — R109 Unspecified abdominal pain: Secondary | ICD-10-CM | POA: Diagnosis not present

## 2018-03-29 DIAGNOSIS — K2990 Gastroduodenitis, unspecified, without bleeding: Secondary | ICD-10-CM

## 2018-03-29 DIAGNOSIS — K2970 Gastritis, unspecified, without bleeding: Secondary | ICD-10-CM | POA: Diagnosis not present

## 2018-03-29 DIAGNOSIS — R634 Abnormal weight loss: Secondary | ICD-10-CM | POA: Diagnosis not present

## 2018-03-29 DIAGNOSIS — K621 Rectal polyp: Secondary | ICD-10-CM | POA: Diagnosis not present

## 2018-03-29 DIAGNOSIS — K2950 Unspecified chronic gastritis without bleeding: Secondary | ICD-10-CM | POA: Diagnosis not present

## 2018-03-29 DIAGNOSIS — D129 Benign neoplasm of anus and anal canal: Secondary | ICD-10-CM

## 2018-03-29 MED ORDER — SODIUM CHLORIDE 0.9 % IV SOLN: 500.0000 mL | Freq: Once | INTRAVENOUS | Status: DC

## 2018-03-29 NOTE — Progress Notes (Signed)
Report to PACU, RN, vss, BBS= Clear.  

## 2018-03-29 NOTE — Op Note (Signed)
[facility] Patient Name: Amanda Rich Procedure Date: 03/29/2018 2:48 PM MRN: [id number] Endoscopist: Milus Banister , MD Age: 52 Referring MD:  Date of Birth: 1965/12/31 Gender: Female Account #: [id number] Procedure:                Upper GI endoscopy Indications:              Generalized abdominal pain, Weight loss Medicines:                Monitored Anesthesia Care Procedure:                Pre-Anesthesia Assessment:                           - Prior to the procedure, a History and Physical                            was performed, and patient medications and                            allergies were reviewed. The patient's tolerance of                            previous anesthesia was also reviewed. The risks                            and benefits of the procedure and the sedation                            options and risks were discussed with the patient.                            All questions were answered, and informed consent                            was obtained. Prior Anticoagulants: The patient has                            taken no previous anticoagulant or antiplatelet                            agents. ASA Grade Assessment: II - A patient with                            mild systemic disease. After reviewing the risks                            and benefits, the patient was deemed in                            satisfactory condition to undergo the procedure.                           After obtaining informed consent, the endoscope was  passed under direct vision. Throughout the                            procedure, the patient's blood pressure, pulse, and                            oxygen saturations were monitored continuously. The                            Model GIF-[id number]) scope was introduced                            through the mouth, and advanced to the second part                            of duodenum.  The upper GI endoscopy was                            accomplished without difficulty. The patient                            tolerated the procedure well. Scope In: Scope Out: Findings:                 The esophagus was normal.                           Minimal inflammation characterized by erythema was                            found in the gastric antrum. Biopsies were taken                            with a cold forceps for histology.                           The examined duodenum was normal. Biopsies for                            histology were taken with a cold forceps for                            evaluation of celiac disease. Complications:            No immediate complications. Estimated blood loss:                            None. Estimated Blood Loss:     Estimated blood loss: none. Impression:               - Normal esophagus.                           - Mild gastritis, biopsied to check for H. pylori.                           -  Normal examined duodenum. Biopsied to check for                            Celiac Sprue. Recommendation:           - Patient has a contact number available for                            emergencies. The signs and symptoms of potential                            delayed complications were discussed with the                            patient. Return to normal activities tomorrow.                            Written discharge instructions were provided to the                            patient.                           - Resume previous diet.                           - Continue present medications.                           - Await pathology results. If the biopsy results do                            not explain her post prandial abdominal pains,                            weight loss then I will arrange further testing (CT                            scan abd/pelvis and CXR). Milus Banister, MD 03/29/2018 3:18:18 PM This report has been  signed electronically.

## 2018-03-29 NOTE — Progress Notes (Signed)
Called to room to assist during endoscopic procedure.  Patient ID and intended procedure confirmed with present staff. Received instructions for my participation in the procedure from the performing physician.  

## 2018-03-29 NOTE — Op Note (Addendum)
[facility] Patient Name: Amanda Rich Procedure Date: 03/29/2018 2:48 PM MRN: [id number] Endoscopist: Milus Banister , MD Age: 52 Referring MD:  Date of Birth: 01/08/1966 Gender: Female Account #: [id number] Procedure:                Colonoscopy Indications:              Weight loss Medicines:                Monitored Anesthesia Care Procedure:                Pre-Anesthesia Assessment:                           - Prior to the procedure, a History and Physical                            was performed, and patient medications and                            allergies were reviewed. The patient's tolerance of                            previous anesthesia was also reviewed. The risks                            and benefits of the procedure and the sedation                            options and risks were discussed with the patient.                            All questions were answered, and informed consent                            was obtained. Prior Anticoagulants: The patient has                            taken no previous anticoagulant or antiplatelet                            agents. ASA Grade Assessment: II - A patient with                            mild systemic disease. After reviewing the risks                            and benefits, the patient was deemed in                            satisfactory condition to undergo the procedure.                           After obtaining informed consent, the colonoscope  was passed under direct vision. Throughout the                            procedure, the patient's blood pressure, pulse, and                            oxygen saturations were monitored continuously. The                            Colonoscope was introduced through the anus and                            advanced to the the cecum, identified by                            appendiceal orifice and ileocecal valve. The                  colonoscopy was performed without difficulty. The                            patient tolerated the procedure well. The quality                            of the bowel preparation was good. The ileocecal                            valve, appendiceal orifice, and rectum were                            photographed. Scope In: 2:50:19 PM Scope Out: 3:05:21 PM Scope Withdrawal Time: 0 hours 10 minutes 33 seconds  Total Procedure Duration: 0 hours 15 minutes 2 seconds  Findings:                 A 4 mm polyp was found in the rectum. The polyp was                            sessile. The polyp was removed with a cold snare.                            Resection and retrieval were complete.                           The exam was otherwise without abnormality on                            direct and retroflexion views. Complications:            No immediate complications. Estimated blood loss:                            None. Estimated Blood Loss:     Estimated blood loss: none. Impression:               - One 4 mm polyp in the rectum, removed with a  cold                            snare. Resected and retrieved.                           - The examination was otherwise normal on direct                            and retroflexion views. Recommendation:           - Patient has a contact number available for                            emergencies. The signs and symptoms of potential                            delayed complications were discussed with the                            patient. Return to normal activities tomorrow.                            Written discharge instructions were provided to the                            patient.                           - Resume previous diet.                           - Continue present medications.                           You will receive a letter within 2-3 weeks with the                            pathology results and my final  recommendations.                           If the polyp(s) is proven to be 'pre-cancerous' on                            pathology, you will need repeat colonoscopy in 5                            years. If the polyp(s) is NOT 'precancerous' on                            pathology then you should repeat colon cancer                            screening in 10 years with colonoscopy without need  for colon cancer screening by any method prior to                            then (including stool testing). Milus Banister, MD 03/29/2018 3:07:07 PM This report has been signed electronically.

## 2018-03-29 NOTE — Patient Instructions (Signed)
Handouts given:  Gastritis and Polyps  YOU HAD AN ENDOSCOPIC PROCEDURE TODAY AT THE [facility]:   Refer to the procedure report that was given to you for any specific questions about what was found during the examination.  If the procedure report does not answer your questions, please call your gastroenterologist to clarify.  If you requested that your care partner not be given the details of your procedure findings, then the procedure report has been included in a sealed envelope for you to review at your convenience later.  YOU SHOULD EXPECT: Some feelings of bloating in the abdomen. Passage of more gas than usual.  Walking can help get rid of the air that was put into your GI tract during the procedure and reduce the bloating. If you had a lower endoscopy (such as a colonoscopy or flexible sigmoidoscopy) you may notice spotting of blood in your stool or on the toilet paper. If you underwent a bowel prep for your procedure, you may not have a normal bowel movement for a few days.  Please Note:  You might notice some irritation and congestion in your nose or some drainage.  This is from the oxygen used during your procedure.  There is no need for concern and it should clear up in a day or so.  SYMPTOMS TO REPORT IMMEDIATELY:   Following lower endoscopy (colonoscopy or flexible sigmoidoscopy):  Excessive amounts of blood in the stool  Significant tenderness or worsening of abdominal pains  Swelling of the abdomen that is new, acute  Fever of 100F or higher   Following upper endoscopy (EGD)  Vomiting of blood or coffee ground material  New chest pain or pain under the shoulder blades  Painful or persistently difficult swallowing  New shortness of breath  Fever of 100F or higher  Black, tarry-looking stools  For urgent or emergent issues, a gastroenterologist can be reached at any hour by calling [phone].   DIET:  We do recommend a small meal at first, but then you  may proceed to your regular diet.  Drink plenty of fluids but you should avoid alcoholic beverages for 24 hours.  ACTIVITY:  You should plan to take it easy for the rest of today and you should NOT DRIVE or use heavy machinery until tomorrow (because of the sedation medicines used during the test).    FOLLOW UP: Our staff will call the number listed on your records the next business day following your procedure to check on you and address any questions or concerns that you may have regarding the information given to you following your procedure. If we do not reach you, we will leave a message.  However, if you are feeling well and you are not experiencing any problems, there is no need to return our call.  We will assume that you have returned to your regular daily activities without incident.  If any biopsies were taken you will be contacted by phone or by letter within the next 1-3 weeks.  Please call us at [phone] if you have not heard about the biopsies in 3 weeks.    SIGNATURES/CONFIDENTIALITY: You and/or your care partner have signed paperwork which will be entered into your electronic medical record.  These signatures attest to the fact that that the information above on your After Visit Summary has been reviewed and is understood.  Full responsibility of the confidentiality of this discharge information lies with you and/or your care-partner. 

## 2018-04-01 ENCOUNTER — Telehealth: Payer: Self-pay

## 2018-04-01 NOTE — Telephone Encounter (Signed)
  Follow up Call-  Call Tabitha Tupper number 03/29/2018  Post procedure Call Aleksey Newbern phone  # [phone]  Permission to leave phone message Yes  Some recent data might be hidden     Patient questions:  Do you have a fever, pain , or abdominal swelling? No. Pain Score  0 *  Have you tolerated food without any problems? Yes.    Have you been able to return to your normal activities? Yes.    Do you have any questions about your discharge instructions: Diet   No. Medications  No. Follow up visit  No.  Do you have questions or concerns about your Care? No.  Actions: * If pain score is 4 or above: No action needed, pain <4.

## 2018-04-09 ENCOUNTER — Other Ambulatory Visit: Payer: Self-pay

## 2018-04-09 DIAGNOSIS — R634 Abnormal weight loss: Secondary | ICD-10-CM

## 2018-04-09 NOTE — Progress Notes (Signed)
You have been scheduled for a CT scan of the abdomen and pelvis at [facility] ([address]---this is in the same building as Press [employer]).   You are scheduled on 04/19/18 at 1 pm. You should arrive 15 minutes prior to your appointment time for registration. Please follow the written instructions below on the day of your exam:  WARNING: IF YOU ARE ALLERGIC TO IODINE/X-RAY DYE, PLEASE NOTIFY RADIOLOGY IMMEDIATELY AT [phone]! YOU WILL BE GIVEN A 13 HOUR PREMEDICATION PREP.  1) Do not eat or drink anything after 9 am (4 hours prior to your test) 2) You have been given 2 bottles of oral contrast to drink. The solution may taste better if refrigerated, but do NOT add ice or any other liquid to this solution. Shake well before drinking.   Drink 1 bottle of contrast @ 11 am (2 hours prior to your exam) Drink 1 bottle of contrast @ 12 PM (1 hour prior to your exam)  You may take any medications as prescribed with a small amount of water except for the following: Metformin, Glucophage, Glucovance, Avandamet, Riomet, Fortamet, Actoplus Met, Janumet, Glumetza or Metaglip. The above medications must be held the day of the exam AND 48 hours after the exam.  The purpose of you drinking the oral contrast is to aid in the visualization of your intestinal tract. The contrast solution may cause some diarrhea. Before your exam is started, you will be given a small amount of fluid to drink. Depending on your individual set of symptoms, you may also receive an intravenous injection of x-ray contrast/dye. Plan on being at [facility] for 30 minutes or longer, depending on the type of exam you are having performed.  This test typically takes 30-45 minutes to complete.  If you have any questions regarding your exam or if you need to reschedule, you may call the CT department at [phone] between the hours of 8:00 am and 5:00 pm,  Monday-Friday.  ________________________________________________________________________

## 2018-04-19 ENCOUNTER — Inpatient Hospital Stay: Admission: RE | Admit: 2018-04-19 | Payer: BLUE CROSS/BLUE SHIELD | Source: Ambulatory Visit

## 2018-05-03 ENCOUNTER — Ambulatory Visit (INDEPENDENT_AMBULATORY_CARE_PROVIDER_SITE_OTHER)
Admission: RE | Admit: 2018-05-03 | Discharge: 2018-05-03 | Disposition: A | Discharge status: Home / Self Care | Payer: BLUE CROSS/BLUE SHIELD | Source: Ambulatory Visit | Attending: Gastroenterology | Admitting: Gastroenterology

## 2018-05-03 DIAGNOSIS — R109 Unspecified abdominal pain: Secondary | ICD-10-CM | POA: Diagnosis not present

## 2018-05-03 DIAGNOSIS — R634 Abnormal weight loss: Secondary | ICD-10-CM

## 2018-05-03 MED ORDER — IOPAMIDOL (ISOVUE-300) INJECTION 61%
80.0000 mL | Freq: Once | INTRAVENOUS | Status: AC | PRN
  Administered 2018-05-03: 80 mL via INTRAVENOUS

## 2018-05-07 ENCOUNTER — Other Ambulatory Visit: Payer: Self-pay

## 2018-05-07 DIAGNOSIS — R109 Unspecified abdominal pain: Secondary | ICD-10-CM

## 2018-05-07 DIAGNOSIS — R634 Abnormal weight loss: Secondary | ICD-10-CM

## 2018-05-07 DIAGNOSIS — R9389 Abnormal findings on diagnostic imaging of other specified body structures: Secondary | ICD-10-CM

## 2018-05-10 ENCOUNTER — Ambulatory Visit (HOSPITAL_COMMUNITY): Payer: BLUE CROSS/BLUE SHIELD

## 2018-05-17 ENCOUNTER — Ambulatory Visit (HOSPITAL_COMMUNITY): Admission: RE | Admit: 2018-05-17 | Payer: BLUE CROSS/BLUE SHIELD | Source: Ambulatory Visit

## 2018-06-05 ENCOUNTER — Ambulatory Visit: Payer: BLUE CROSS/BLUE SHIELD | Admitting: Family Medicine

## 2018-06-05 ENCOUNTER — Encounter: Payer: Self-pay | Admitting: Family Medicine

## 2018-06-05 VITALS — BP 120/80 | HR 60 | Resp 16 | Ht 64.5 in | Wt 114.4 lb

## 2018-06-05 DIAGNOSIS — F329 Major depressive disorder, single episode, unspecified: Secondary | ICD-10-CM

## 2018-06-05 DIAGNOSIS — F32A Depression, unspecified: Secondary | ICD-10-CM

## 2018-06-05 DIAGNOSIS — F17200 Nicotine dependence, unspecified, uncomplicated: Secondary | ICD-10-CM

## 2018-06-05 DIAGNOSIS — R634 Abnormal weight loss: Secondary | ICD-10-CM | POA: Diagnosis not present

## 2018-06-05 MED ORDER — CITALOPRAM HYDROBROMIDE 20 MG PO TABS: 20.0000 mg | ORAL_TABLET | Freq: Every day | ORAL | 2 refills | Status: DC

## 2018-06-05 NOTE — Progress Notes (Signed)
   Subjective:    Patient ID: Amanda Rich, female    DOB: 1966/12/04, 52 y.o.   MRN: [id number]  HPI Chief Complaint  Patient presents with  . follow-up    follow-up on weight loss   She is here to follow up on abnormal weight loss and depression.   Lost 2 lbs. Reports loss of appetite that she thinks is related to depression.   She has recently seen Dr. Ardis Hughs, GI, and had an EDG, colonoscopy and CT scan of her abdomen. Polyp was benign and due for recall colonoscopy in 10 years. She was scheduled for a chest XR but has not gone for this.  Abnormal finding on CT  was uterine fibroids. She was then schedule for a pelvic US but has not had this done because she is not having any pain or issues with these. Denies cramping or heavy bleeding.  Reports she has been worried about cancer. No longer having worries about this.   History of pelvic congestion syndrome.  She plans to see a new OB/GYN.  History of endometrial ablation.   Depression - reports having a difficult time around Mother's Day and Father's Day. She has loss of appetite and thinks this may be related to feeling sad.  States she has taken Celexa in the past and did well with it. Would like to start back. She stopped because she thought she should be able to manage on her own without medication.   Seeing her pastor for counseling.   Smoking still. She is interested in stopping and has patches at home. Contemplating stopping again.   Denies fever, chills, dizziness, chest pain, palpitations, shortness of breath, abdominal pain, N/V/D, urinary symptoms, LE edema.     Review of Systems Pertinent positives and negatives in the history of present illness.     Objective:   Physical Exam BP 120/80   Pulse 60   Resp 16   Ht 5' 4.5" (1.638 m)   Wt 114 lb 6.4 oz (51.9 kg)   BMI 19.33 kg/m   Alert and oriented and in no acute distress. Not otherwise examined.       Assessment & Plan:  Depression, unspecified  depression type - Plan: citalopram (CELEXA) 20 MG tablet, DISCONTINUED: citalopram (CELEXA) 20 MG tablet, DISCONTINUED: citalopram (CELEXA) 20 MG tablet  Weight loss, abnormal  Smoker  Will start her on Celexa. Start with 10 mg for the first week then increase to 20 mg if doing well. Discussed management of depression and potential side effects of medication. She will continue counseling with her pastor and let me know if she would like help finding a therapist. Recent work GI work up shows no explanation for weight loss. Suspect related to depression.  Encouraged her to stop smoking. She is working on this and has patches.  Follow up in 2 weeks to see how she is doing on medication. She will schedule with OB/GYN.

## 2018-06-05 NOTE — Patient Instructions (Signed)
Start the Celexa 10 mg or 1/2 tablet the first week then increase to the 20 mg or whole tablet on day 8.  Follow up with me in 2 weeks.   Citalopram tablets What is this medicine? CITALOPRAM (sye TAL oh pram) is a medicine for depression. This medicine may be used for other purposes; ask your health care provider or pharmacist if you have questions. COMMON BRAND NAME(S): Celexa What should I tell my health care provider before I take this medicine? They need to know if you have any of these conditions: -bleeding disorders -bipolar disorder or a family history of bipolar disorder -glaucoma -heart disease -history of irregular heartbeat -kidney disease -liver disease -low levels of magnesium or potassium in the blood -receiving electroconvulsive therapy -seizures -suicidal thoughts, plans, or attempt; a previous suicide attempt by you or a family member -take medicines that treat or prevent blood clots -thyroid disease -an unusual or allergic reaction to citalopram, escitalopram, other medicines, foods, dyes, or preservatives -pregnant or trying to become pregnant -breast-feeding How should I use this medicine? Take this medicine by mouth with a glass of water. Follow the directions on the prescription label. You can take it with or without food. Take your medicine at regular intervals. Do not take your medicine more often than directed. Do not stop taking this medicine suddenly except upon the advice of your doctor. Stopping this medicine too quickly may cause serious side effects or your condition may worsen. A special MedGuide will be given to you by the pharmacist with each prescription and refill. Be sure to read this information carefully each time. Talk to your pediatrician regarding the use of this medicine in children. Special care may be needed. Patients over 98 years old may have a stronger reaction and need a smaller dose. Overdosage: If you think you have taken too much of  this medicine contact a poison control center or emergency room at once. NOTE: This medicine is only for you. Do not share this medicine with others. What if I miss a dose? If you miss a dose, take it as soon as you can. If it is almost time for your next dose, take only that dose. Do not take double or extra doses. What may interact with this medicine? Do not take this medicine with any of the following medications: -certain medicines for fungal infections like fluconazole, itraconazole, ketoconazole, posaconazole, voriconazole -cisapride -dofetilide -dronedarone -escitalopram -linezolid -MAOIs like Carbex, Eldepryl, Marplan, Nardil, and Parnate -methylene blue (injected into a vein) -pimozide -thioridazine -ziprasidone This medicine may also interact with the following medications: -alcohol -amphetamines -aspirin and aspirin-like medicines -carbamazepine -certain medicines for depression, anxiety, or psychotic disturbances -certain medicines for infections like chloroquine, clarithromycin, erythromycin, furazolidone, isoniazid, pentamidine -certain medicines for migraine headaches like almotriptan, eletriptan, frovatriptan, naratriptan, rizatriptan, sumatriptan, zolmitriptan -certain medicines for sleep -certain medicines that treat or prevent blood clots like dalteparin, enoxaparin, warfarin -cimetidine -diuretics -fentanyl -lithium -methadone -metoprolol -NSAIDs, medicines for pain and inflammation, like ibuprofen or naproxen -omeprazole -other medicines that prolong the QT interval (cause an abnormal heart rhythm) -procarbazine -rasagiline -supplements like St. John's wort, kava kava, valerian -tramadol -tryptophan This list may not describe all possible interactions. Give your health care provider a list of all the medicines, herbs, non-prescription drugs, or dietary supplements you use. Also tell them if you smoke, drink alcohol, or use illegal drugs. Some items may  interact with your medicine. What should I watch for while using this medicine? Tell your doctor if your  symptoms do not get better or if they get worse. Visit your doctor or health care professional for regular checks on your progress. Because it may take several weeks to see the full effects of this medicine, it is important to continue your treatment as prescribed by your doctor. Patients and their families should watch out for new or worsening thoughts of suicide or depression. Also watch out for sudden changes in feelings such as feeling anxious, agitated, panicky, irritable, hostile, aggressive, impulsive, severely restless, overly excited and hyperactive, or not being able to sleep. If this happens, especially at the beginning of treatment or after a change in dose, call your health care professional. Dennis Bast may get drowsy or dizzy. Do not drive, use machinery, or do anything that needs mental alertness until you know how this medicine affects you. Do not stand or sit up quickly, especially if you are an older patient. This reduces the risk of dizzy or fainting spells. Alcohol may interfere with the effect of this medicine. Avoid alcoholic drinks. Your mouth may get dry. Chewing sugarless gum or sucking hard candy, and drinking plenty of water will help. Contact your doctor if the problem does not go away or is severe. What side effects may I notice from receiving this medicine? Side effects that you should report to your doctor or health care professional as soon as possible: -allergic reactions like skin rash, itching or hives, swelling of the face, lips, or tongue -anxious -black, tarry stools -breathing problems -changes in vision -chest pain -confusion -elevated mood, decreased need for sleep, racing thoughts, impulsive behavior -eye pain -fast, irregular heartbeat -feeling faint or lightheaded, falls -feeling agitated, angry, or irritable -hallucination, loss of contact with  reality -loss of balance or coordination -loss of memory -painful or prolonged erections -restlessness, pacing, inability to keep still -seizures -stiff muscles -suicidal thoughts or other mood changes -trouble sleeping -unusual bleeding or bruising -unusually weak or tired -vomiting Side effects that usually do not require medical attention (report to your doctor or health care professional if they continue or are bothersome): -change in appetite or weight -change in sex drive or performance -dizziness -headache -increased sweating -indigestion, nausea -tremors This list may not describe all possible side effects. Call your doctor for medical advice about side effects. You may report side effects to FDA at 1-800-FDA-1088. Where should I keep my medicine? Keep out of reach of children. Store at room temperature between 15 and 30 degrees C (59 and 86 degrees F). Throw away any unused medicine after the expiration date. NOTE: This sheet is a summary. It may not cover all possible information. If you have questions about this medicine, talk to your doctor, pharmacist, or health care provider.  2018 Elsevier/Gold Standard (2016-05-15 13:18:52)

## 2018-06-05 NOTE — Progress Notes (Addendum)
Called celexa into to pharmacy # [phone] with 2 refills

## 2018-06-18 ENCOUNTER — Encounter: Payer: Self-pay | Admitting: Family Medicine

## 2018-06-18 ENCOUNTER — Ambulatory Visit: Payer: BLUE CROSS/BLUE SHIELD | Admitting: Family Medicine

## 2018-06-18 VITALS — BP 150/88 | HR 62 | Temp 97.9°F | Ht 65.0 in | Wt 115.4 lb

## 2018-06-18 DIAGNOSIS — F32A Depression, unspecified: Secondary | ICD-10-CM

## 2018-06-18 DIAGNOSIS — F329 Major depressive disorder, single episode, unspecified: Secondary | ICD-10-CM

## 2018-06-18 DIAGNOSIS — F17200 Nicotine dependence, unspecified, uncomplicated: Secondary | ICD-10-CM

## 2018-06-18 NOTE — Progress Notes (Signed)
   Subjective:    Patient ID: Amanda Rich, female    DOB: 07-19-1966, 52 y.o.   MRN: [id number]  HPI Chief Complaint  Patient presents with  . Depression    follow up   She is here to follow up on depression. Started on citalopram 2 weeks ago. States she can only take 10 mg of the medication. States she feels too sleepy with 20 mg. She is taking 1/2 half tablet for now. Would like to continue on this dose.  States she is resting better, appetite has improved, feels happy and excited about life again. States she is excited because she has gained a pound.   She is still smoking. Smoked a cigarette just before coming into the office. She worked last night and just got off work. Her blood pressure is higher than usual.  Denies fever, chills, dizziness, chest pain, palpitations, shortness of breath, abdominal pain, N/V/D.   Reviewed allergies, medications, past medical, surgical, family, and social history.    Review of Systems Pertinent positives and negatives in the history of present illness.     Objective:   Physical Exam BP (!) 150/88   Pulse 62   Temp 97.9 F (36.6 C) (Oral)   Ht 5\' 5"  (1.651 m)   Wt 115 lb 6.4 oz (52.3 kg)   SpO2 99%   BMI 19.20 kg/m   Alert and oriented and in no acute distress.  Pleasant appearing.  Not otherwise examined.      Assessment & Plan:  Depression, unspecified depression type  Smoker  Significant improvement in sleep, appetite and energy level.  She is tolerating the 10 mg of Celexa stating 20 mg makes her too sleepy.  We will continue on the 10 mg daily dosing.  She is aware that her blood pressure is elevated today and she does not have a history of hypertension.  She did just get off work and smoked a cigarette prior to walking into the office.  Advised her to keep a check on this at home and report back if her readings are continuing to be elevated.  Advised her to stop smoking. Follow-up in 3 months or sooner if needed. We will  refill citalopram at 10 mg when she is due.

## 2018-06-18 NOTE — Patient Instructions (Signed)
Continue on your current medication.

## 2018-09-14 ENCOUNTER — Encounter (HOSPITAL_COMMUNITY): Payer: Self-pay | Admitting: Emergency Medicine

## 2018-09-14 ENCOUNTER — Emergency Department (HOSPITAL_COMMUNITY)
Admission: EM | Admit: 2018-09-14 | Discharge: 2018-09-14 | Disposition: A | Discharge status: Home / Self Care | Payer: BLUE CROSS/BLUE SHIELD | Attending: Emergency Medicine | Admitting: Emergency Medicine

## 2018-09-14 ENCOUNTER — Emergency Department (HOSPITAL_COMMUNITY): Payer: BLUE CROSS/BLUE SHIELD

## 2018-09-14 DIAGNOSIS — F17210 Nicotine dependence, cigarettes, uncomplicated: Secondary | ICD-10-CM | POA: Insufficient documentation

## 2018-09-14 DIAGNOSIS — R69 Illness, unspecified: Secondary | ICD-10-CM

## 2018-09-14 DIAGNOSIS — R509 Fever, unspecified: Secondary | ICD-10-CM | POA: Diagnosis present

## 2018-09-14 DIAGNOSIS — J111 Influenza due to unidentified influenza virus with other respiratory manifestations: Secondary | ICD-10-CM

## 2018-09-14 DIAGNOSIS — Z79899 Other long term (current) drug therapy: Secondary | ICD-10-CM | POA: Diagnosis not present

## 2018-09-14 DIAGNOSIS — R0600 Dyspnea, unspecified: Secondary | ICD-10-CM | POA: Diagnosis not present

## 2018-09-14 DIAGNOSIS — G4489 Other headache syndrome: Secondary | ICD-10-CM | POA: Diagnosis not present

## 2018-09-14 DIAGNOSIS — Z720 Tobacco use: Secondary | ICD-10-CM

## 2018-09-14 DIAGNOSIS — R05 Cough: Secondary | ICD-10-CM | POA: Diagnosis not present

## 2018-09-14 LAB — COMPREHENSIVE METABOLIC PANEL
ALT: 14 U/L (ref 0–44)
AST: 22 U/L (ref 15–41)
Albumin: 3.9 g/dL (ref 3.5–5.0)
Alkaline Phosphatase: 46 U/L (ref 38–126)
Anion gap: 9 (ref 5–15)
BUN: 11 mg/dL (ref 6–20)
CO2: 24 mmol/L (ref 22–32)
Calcium: 9.4 mg/dL (ref 8.9–10.3)
Chloride: 106 mmol/L (ref 98–111)
Creatinine, Ser: 0.75 mg/dL (ref 0.44–1.00)
GFR calc Af Amer: >60 mL/min (ref >60)
GFR calc non Af Amer: >60 mL/min (ref >60)
Glucose, Bld: 124 mg/dL — ABNORMAL HIGH (ref 70–99)
Potassium: 3.4 mmol/L — ABNORMAL LOW (ref 3.5–5.1)
Sodium: 139 mmol/L (ref 135–145)
Total Bilirubin: 1 mg/dL (ref 0.3–1.2)
Total Protein: 6.5 g/dL (ref 6.5–8.1)

## 2018-09-14 LAB — CBC WITH DIFFERENTIAL/PLATELET
Abs Immature Granulocytes: 0 10*3/uL (ref 0.0–0.1)
Basophils Absolute: 0 10*3/uL (ref 0.0–0.1)
Basophils Relative: 1 %
Eosinophils Absolute: 0.1 10*3/uL (ref 0.0–0.7)
Eosinophils Relative: 2 %
HCT: 39.2 % (ref 36.0–46.0)
Hemoglobin: 12.4 g/dL (ref 12.0–15.0)
Immature Granulocytes: 0 %
Lymphocytes Relative: 35 %
Lymphs Abs: 2.2 10*3/uL (ref 0.7–4.0)
MCH: 27.9 pg (ref 26.0–34.0)
MCHC: 31.6 g/dL (ref 30.0–36.0)
MCV: 88.1 fL (ref 78.0–100.0)
Monocytes Absolute: 0.5 10*3/uL (ref 0.1–1.0)
Monocytes Relative: 8 %
Neutro Abs: 3.6 10*3/uL (ref 1.7–7.7)
Neutrophils Relative %: 54 %
Platelets: 213 10*3/uL (ref 150–400)
RBC: 4.45 MIL/uL (ref 3.87–5.11)
RDW: 13.3 % (ref 11.5–15.5)
WBC: 6.5 10*3/uL (ref 4.0–10.5)

## 2018-09-14 LAB — URINALYSIS, ROUTINE W REFLEX MICROSCOPIC
Bilirubin Urine: NEGATIVE
Glucose, UA: NEGATIVE mg/dL
Hgb urine dipstick: NEGATIVE
Ketones, ur: NEGATIVE mg/dL
Leukocytes, UA: NEGATIVE
Nitrite: NEGATIVE
Protein, ur: NEGATIVE mg/dL
Specific Gravity, Urine: 1.011 (ref 1.005–1.030)
pH: 5 (ref 5.0–8.0)

## 2018-09-14 LAB — I-STAT CG4 LACTIC ACID, ED
Lactic Acid, Venous: 1.48 mmol/L (ref 0.5–1.9)
Lactic Acid, Venous: 0.68 mmol/L (ref 0.5–1.9)

## 2018-09-14 LAB — I-STAT BETA HCG BLOOD, ED (MC, WL, AP ONLY): I-stat hCG, quantitative: <5 m[IU]/mL (ref <5)

## 2018-09-14 MED ORDER — DIPHENHYDRAMINE HCL 50 MG/ML IJ SOLN
12.5000 mg | Freq: Once | INTRAMUSCULAR | Status: AC
  Administered 2018-09-14: 12.5 mg via INTRAVENOUS
  Filled 2018-09-14: qty 1

## 2018-09-14 MED ORDER — SODIUM CHLORIDE 0.9 % IV BOLUS
500.0000 mL | Freq: Once | INTRAVENOUS | Status: AC
  Administered 2018-09-14: 500 mL via INTRAVENOUS

## 2018-09-14 MED ORDER — METOCLOPRAMIDE HCL 5 MG/ML IJ SOLN
5.0000 mg | Freq: Once | INTRAMUSCULAR | Status: AC
  Administered 2018-09-14: 5 mg via INTRAVENOUS
  Filled 2018-09-14: qty 2

## 2018-09-14 MED ORDER — KETOROLAC TROMETHAMINE 30 MG/ML IJ SOLN
15.0000 mg | Freq: Once | INTRAMUSCULAR | Status: AC
  Administered 2018-09-14: 15 mg via INTRAVENOUS
  Filled 2018-09-14: qty 1

## 2018-09-14 NOTE — ED Triage Notes (Addendum)
Pt arrives via EMS for c.o. Headache, fever, cough, congestion and neck pain for several days. Pt reports thick dark mucous. Pt also has pain to the back of the neck that is worse when she bends her head down. EMS gave 1G of tylenol for pain. Vitals stable.

## 2018-09-14 NOTE — ED Provider Notes (Signed)
[place] EMERGENCY DEPARTMENT Provider Note   CSN: [id number] Arrival date & time: 09/14/18  1541     History   Chief Complaint Chief Complaint  Patient presents with  . Fever  . Cough  . Headache    HPI Amanda Rich is a 52 y.o. female.  52 year old female daily smoker who presents the emergency department chief complaint of flulike illness.  Over the past 2 days the patient developed progressively worsening productive cough, body aches, joint pain, chills, headache.  She works third shift and felt weak and febrile this morning with body aches and chills.  She took TheraFlu and went to sleep.  When she awoke she felt stiff and achy everywhere and had some pain on the left side of her neck.  The pain is worse with forward flexion and raising the shoulder blade.  She denies pain on the right side of her neck.  She denies rash, photophobia, phonophobia.  She has no history of migraine headaches.   URI   This is a new problem. The current episode started 2 days ago. The problem has been gradually worsening. The fever has been present for less than 1 day (subjective fever this morning). Associated symptoms include congestion, headaches, joint pain, neck pain and cough. Pertinent negatives include no chest pain, no abdominal pain, no diarrhea, no nausea, no vomiting, no dysuria, no ear pain, no plugged ear sensation, no rhinorrhea, no sinus pain, no sneezing, no sore throat, no swollen glands, no joint swelling, no rash and no wheezing. She has tried NSAIDs for the symptoms. The treatment provided mild relief.    Past Medical History:  Diagnosis Date  . Anemia   . Depression   . Smoker     Patient Active Problem List   Diagnosis Date Noted  . History of endometrial ablation 01/21/2018  . Family history of colon cancer 09/24/2017  . Poor diet 01/24/2017  . Weight loss, abnormal 01/24/2017  . Smoker 09/22/2016  . Mixed hyperlipidemia 08/07/2016  .  Irregular intermenstrual bleeding 08/07/2016  . Depression 08/07/2016  . 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 Pelvic congestion syndrome 08/07/2016    Past Surgical History:  Procedure Laterality Date  . ABLATION ON ENDOMETRIOSIS    . GANGLION CYST EXCISION  2007   L wrist; [facility], [place]  . TUBAL LIGATION     [facility]  . UMBILICAL HERNIA REPAIR  2005   [facility]     OB History   None      Home Medications    Prior to Admission medications   Medication Sig Start Date End Date Taking? Authorizing Provider  Biotin 5000 MCG CAPS Take by mouth.    [provider]  Cholecalciferol (VITAMIN D3) 2000 units TABS Take by mouth.    [provider]  citalopram (CELEXA) 20 MG tablet Take 1 tablet (20 mg total) by mouth daily. Patient taking differently: Take 10 mg by mouth daily.  06/05/18   Henson, Vickie L, NP-C  Melatonin 3 MG TABS Take by mouth.    [provider]  Multiple Vitamin (MULTIVITAMIN WITH MINERALS) TABS Take 1 tablet by mouth daily.    [provider]  nicotine (NICODERM CQ - DOSED IN MG/24 HOURS) 21 mg/24hr patch Place 21 mg onto the skin daily.    [provider]  TURMERIC PO Take by mouth.    [provider]    Family History Family History  Problem Relation Age of Onset  .  Diabetes Mother   . Hypertension Mother   . Liver cancer Daughter   . Diabetes Maternal Aunt   . Colon cancer Maternal Uncle   . Colon cancer Cousin     Social History Social History   Tobacco Use  . Smoking status: Current Some Day Smoker    Packs/day: 1.00    Years: 25.00    Pack years: 25.00    Types: Cigarettes  . Smokeless tobacco: Never Used  . Tobacco comment: smoking cigarettes again and not e-cigs  Substance Use Topics  . Alcohol use: Yes    Alcohol/week: 1.0 standard drinks    Types: 1 Glasses of wine per week  . Drug use: No     Allergies   Clarithromycin   Review of Systems Review of Systems    Constitutional: Negative.   HENT: Positive for congestion. Negative for ear pain, rhinorrhea, sinus pain, sneezing and sore throat.   Eyes: Negative.   Respiratory: Positive for cough. Negative for wheezing.   Cardiovascular: Negative for chest pain.  Gastrointestinal: Negative for abdominal pain, diarrhea, nausea and vomiting.  Endocrine: Negative.   Genitourinary: Negative.  Negative for dysuria.  Musculoskeletal: Positive for arthralgias, joint pain, myalgias, neck pain and neck stiffness. Negative for joint swelling.  Skin: Negative for rash.  Neurological: Positive for headaches.  Hematological: Negative.   Psychiatric/Behavioral: Negative.      Physical Exam Updated Vital Signs BP 120/83   Pulse 90   Temp 99.1 F (37.3 C)   Resp 18   SpO2 100%   Physical Exam  Constitutional: She is oriented to person, place, and time. She appears well-developed and well-nourished. She appears ill. No distress.  HENT:  Head: Normocephalic and atraumatic.  Mouth/Throat: Oropharynx is clear and moist.  Eyes: Pupils are equal, round, and reactive to light. Conjunctivae and EOM are normal. No scleral icterus.  No horizontal, vertical or rotational nystagmus  Neck: Normal range of motion. Neck supple. No neck rigidity. No Brudzinski's sign and no Kernig's sign noted.  Patient with tenderness along the left side of the neck is specifically at the inferior angle of the scapula.  It is reproducible.  Full range of motion.  Pain with forward flexion.  No meningismus.  No pain on the right side  Cardiovascular: Normal rate, regular rhythm, normal heart sounds and intact distal pulses. Exam reveals no gallop and no friction rub.  No murmur heard. Pulmonary/Chest: Effort normal and breath sounds normal. No respiratory distress. She has no wheezes. She has no rales.  Abdominal: Soft. Bowel sounds are normal. She exhibits no distension and no mass. There is no tenderness. There is no rebound and no  guarding.  Musculoskeletal: Normal range of motion.  Lymphadenopathy:    She has no cervical adenopathy.  Neurological: She is alert and oriented to person, place, and time. She has normal reflexes. No cranial nerve deficit. She exhibits normal muscle tone. Coordination normal.  Mental Status:  Alert, oriented, thought content appropriate. Speech fluent without evidence of aphasia. Able to follow 2 step commands without difficulty.  Cranial Nerves:  II:  Peripheral visual fields grossly normal, pupils equal, round, reactive to light III,IV, VI: ptosis not present, extra-ocular motions intact bilaterally  V,VII: smile symmetric, facial light touch sensation equal VIII: hearing grossly normal bilaterally  IX,X: midline uvula rise  XI: bilateral shoulder shrug equal and strong XII: midline tongue extension  Motor:  5/5 in upper and lower extremities bilaterally including strong and equal grip strength and dorsiflexion/plantar  flexion Sensory: Pinprick and light touch normal in all extremities.  Deep Tendon Reflexes: 2+ and symmetric  Cerebellar: normal finger-to-nose with bilateral upper extremities Gait: normal gait and balance CV: distal pulses palpable throughout   Skin: Skin is warm and dry. No rash noted. She is not diaphoretic.  Psychiatric: She has a normal mood and affect. Her behavior is normal. Judgment and thought content normal.  Nursing note and vitals reviewed.    ED Treatments / Results  Labs (all labs ordered are listed, but only abnormal results are displayed) Labs Reviewed  COMPREHENSIVE METABOLIC PANEL - Abnormal; Notable for the following components:      Result Value   Potassium 3.4 (*)    Glucose, Bld 124 (*)    All other components within normal limits  CBC WITH DIFFERENTIAL/PLATELET  URINALYSIS, ROUTINE W REFLEX MICROSCOPIC  I-STAT CG4 LACTIC ACID, ED  I-STAT BETA HCG BLOOD, ED ([facility], [facility], [facility] ONLY)  I-STAT CG4 LACTIC ACID, ED    EKG None  Radiology Dg  Chest 2 View  Result Date: 09/14/2018 CLINICAL DATA:  Cough for 2 days and fever for 1 day. EXAM: CHEST - 2 VIEW COMPARISON:  01/09/2017 and prior radiographs FINDINGS: The cardiomediastinal silhouette is unremarkable. There is no evidence of focal airspace disease, pulmonary edema, suspicious pulmonary nodule/mass, pleural effusion, or pneumothorax. No acute bony abnormalities are identified. IMPRESSION: No active cardiopulmonary disease. Electronically Signed   By: Margarette Canada M.D.   On: 09/14/2018 16:21    Procedures Procedures (including critical care time)  Medications Ordered in ED Medications  sodium chloride 0.9 % bolus 500 mL (has no administration in time range)  ketorolac (TORADOL) 30 MG/ML injection 15 mg (has no administration in time range)  metoCLOPramide (REGLAN) injection 5 mg (has no administration in time range)  diphenhydrAMINE (BENADRYL) injection 12.5 mg (has no administration in time range)     Initial Impression / Assessment and Plan / ED Course  I have reviewed the triage vital signs and the nursing notes.  Pertinent labs & imaging results that were available during my care of the patient were reviewed by me and considered in my medical decision making (see chart for details).  Clinical Course as of Sep 15 1731  Sat Sep 14, 2018  1731 The patient was counseled on the dangers of tobacco use, and was advised to quit.  Reviewed strategies to maximize success, including removing cigarettes and smoking materials from environment, stress management, substitution of other forms of reinforcement, support of family/friends and written materials    [AH]    Clinical Course User Index [AH] Margarita Mail, PA-C     Patient here with influenza-like illness, complaining of next effectiveness and headache however her neck stiffness is only one-sided and reproducible with palpation of the musculature.  I do not see any evidence of meningismus I do not have any concern for  viral or bacterial meningitis.  Patient chest x-ray is negative for acute abnormality.  I personally reviewed the PA and lateral chest film and agreed with the radiologic interpretation.  Patient treated with migraine cocktail with resolution of her headache.  Her joint pain is improved.  Discussed return precautions and outpatient follow-up.  She appears appropriate for discharge at this time. Final Clinical Impressions(s) / ED Diagnoses   Final diagnoses:  None    ED Discharge Orders    None       Margarita Mail, PA-C 09/14/18 [place], MD 09/15/18 0001

## 2018-09-14 NOTE — Discharge Instructions (Addendum)
You appear to have an upper respiratory infection (URI). An upper respiratory tract infection, or cold, is a viral infection of the air passages leading to the lungs. It is contagious and can be spread to others, especially during the first 3 or 4 days. It cannot be cured by antibiotics or other medicines. °RETURN IMMEDIATELY IF you develop shortness of breath, confusion or altered mental status, a new rash, become dizzy, faint, or poorly responsive, or are unable to be cared for at home. ° °

## 2018-09-16 ENCOUNTER — Ambulatory Visit: Payer: BLUE CROSS/BLUE SHIELD | Admitting: Family Medicine

## 2018-09-16 ENCOUNTER — Encounter: Payer: Self-pay | Admitting: Family Medicine

## 2018-09-16 VITALS — BP 130/70 | HR 64 | Temp 97.8°F | Resp 16 | Wt 114.8 lb

## 2018-09-16 DIAGNOSIS — J069 Acute upper respiratory infection, unspecified: Secondary | ICD-10-CM | POA: Diagnosis not present

## 2018-09-16 DIAGNOSIS — F19939 Other psychoactive substance use, unspecified with withdrawal, unspecified: Secondary | ICD-10-CM

## 2018-09-16 NOTE — Progress Notes (Signed)
Subjective:  Amanda Rich is a 52 y.o. female who presents for shakiness and anxiety since stopping her Celexa cold [place] last week. States she starting taking Theraflu and medication for flu like symptoms and was not sure if she could take the Celexa as well.   Reports feeling much better since going to the ED on Saturday. No longer has headache, neck pain, and cough is improving.   Denies fever, chills, headache, dizziness, chest pain, palpitations, shortness of breath, abdominal pain, N/V/D.   States she went to the ED 2 days ago for flu-like symptoms including headache, neck pain, cough.  She stopped Celexa 6 days ago because she was given medication there but did not ask if she could continue on her current medications.   Treatment to date: cough suppressants and decongestants.  Denies sick contacts.  No other aggravating or relieving factors.  No other c/o.  ROS as in subjective.   Objective: Vitals:   09/16/18 0818  BP: 130/70  Pulse: 64  Resp: 16  Temp: 97.8 F (36.6 C)    General appearance: Alert, WD/WN, no distress, mildly ill appearing                             Skin: warm, no rash                           Head: no sinus tenderness                            Eyes: conjunctiva normal, corneas clear, PERRLA                            Ears: pearly TMs, external ear canals normal                          Nose: septum midline, turbinates swollen, with erythema and clear discharge             Mouth/throat: MMM, tongue normal, mild pharyngeal erythema                           Neck: supple, no adenopathy, no thyromegaly, nontender                          Heart: RRR, normal S1, S2, no murmurs                         Lungs: CTA bilaterally, no wheezes, rales, or rhonchi      Assessment: Withdrawal from other psychoactive substance (HCC)  Acute URI    Plan: Discussed diagnosis and treatment of URI. Suspect her symptoms are related to viral illness and she  reports significant improvement in symptoms. I did review the notes and results from her ED visits.  Suggested symptomatic OTC remedies. Discussed that she is experiencing withdrawal from the Celexa. Start back on the Celexa and discussed not stopping the medication cold [place] but weaning off if she decides to. Nasal saline spray for congestion.  Tylenol OTC for fever and malaise.     She will call tomorrow to let me know how she is doing. A work note was provided for today.

## 2018-09-16 NOTE — Patient Instructions (Signed)
Start back on the Celexa today. Call me tomorrow and let me know how you are doing.   Citalopram tablets What is this medicine? CITALOPRAM (sye TAL oh pram) is a medicine for depression. This medicine may be used for other purposes; ask your health care provider or pharmacist if you have questions. COMMON BRAND NAME(S): Celexa What should I tell my health care provider before I take this medicine? They need to know if you have any of these conditions: -bleeding disorders -bipolar disorder or a family history of bipolar disorder -glaucoma -heart disease -history of irregular heartbeat -kidney disease -liver disease -low levels of magnesium or potassium in the blood -receiving electroconvulsive therapy -seizures -suicidal thoughts, plans, or attempt; a previous suicide attempt by you or a family member -take medicines that treat or prevent blood clots -thyroid disease -an unusual or allergic reaction to citalopram, escitalopram, other medicines, foods, dyes, or preservatives -pregnant or trying to become pregnant -breast-feeding How should I use this medicine? Take this medicine by mouth with a glass of water. Follow the directions on the prescription label. You can take it with or without food. Take your medicine at regular intervals. Do not take your medicine more often than directed. Do not stop taking this medicine suddenly except upon the advice of your doctor. Stopping this medicine too quickly may cause serious side effects or your condition may worsen. A special MedGuide will be given to you by the pharmacist with each prescription and refill. Be sure to read this information carefully each time. Talk to your pediatrician regarding the use of this medicine in children. Special care may be needed. Patients over 60 years old may have a stronger reaction and need a smaller dose. Overdosage: If you think you have taken too much of this medicine contact a poison control center or  emergency room at once. NOTE: This medicine is only for you. Do not share this medicine with others. What if I miss a dose? If you miss a dose, take it as soon as you can. If it is almost time for your next dose, take only that dose. Do not take double or extra doses. What may interact with this medicine? Do not take this medicine with any of the following medications: -certain medicines for fungal infections like fluconazole, itraconazole, ketoconazole, posaconazole, voriconazole -cisapride -dofetilide -dronedarone -escitalopram -linezolid -MAOIs like Carbex, Eldepryl, Marplan, Nardil, and Parnate -methylene blue (injected into a vein) -pimozide -thioridazine -ziprasidone This medicine may also interact with the following medications: -alcohol -amphetamines -aspirin and aspirin-like medicines -carbamazepine -certain medicines for depression, anxiety, or psychotic disturbances -certain medicines for infections like chloroquine, clarithromycin, erythromycin, furazolidone, isoniazid, pentamidine -certain medicines for migraine headaches like almotriptan, eletriptan, frovatriptan, naratriptan, rizatriptan, sumatriptan, zolmitriptan -certain medicines for sleep -certain medicines that treat or prevent blood clots like dalteparin, enoxaparin, warfarin -cimetidine -diuretics -fentanyl -lithium -methadone -metoprolol -NSAIDs, medicines for pain and inflammation, like ibuprofen or naproxen -omeprazole -other medicines that prolong the QT interval (cause an abnormal heart rhythm) -procarbazine -rasagiline -supplements like St. John's wort, kava kava, valerian -tramadol -tryptophan This list may not describe all possible interactions. Give your health care provider a list of all the medicines, herbs, non-prescription drugs, or dietary supplements you use. Also tell them if you smoke, drink alcohol, or use illegal drugs. Some items may interact with your medicine. What should I watch  for while using this medicine? Tell your doctor if your symptoms do not get better or if they get worse. Visit your doctor or  health care professional for regular checks on your progress. Because it may take several weeks to see the full effects of this medicine, it is important to continue your treatment as prescribed by your doctor. Patients and their families should watch out for new or worsening thoughts of suicide or depression. Also watch out for sudden changes in feelings such as feeling anxious, agitated, panicky, irritable, hostile, aggressive, impulsive, severely restless, overly excited and hyperactive, or not being able to sleep. If this happens, especially at the beginning of treatment or after a change in dose, call your health care professional. Dennis Bast may get drowsy or dizzy. Do not drive, use machinery, or do anything that needs mental alertness until you know how this medicine affects you. Do not stand or sit up quickly, especially if you are an older patient. This reduces the risk of dizzy or fainting spells. Alcohol may interfere with the effect of this medicine. Avoid alcoholic drinks. Your mouth may get dry. Chewing sugarless gum or sucking hard candy, and drinking plenty of water will help. Contact your doctor if the problem does not go away or is severe. What side effects may I notice from receiving this medicine? Side effects that you should report to your doctor or health care professional as soon as possible: -allergic reactions like skin rash, itching or hives, swelling of the face, lips, or tongue -anxious -black, tarry stools -breathing problems -changes in vision -chest pain -confusion -elevated mood, decreased need for sleep, racing thoughts, impulsive behavior -eye pain -fast, irregular heartbeat -feeling faint or lightheaded, falls -feeling agitated, angry, or irritable -hallucination, loss of contact with reality -loss of balance or coordination -loss of  memory -painful or prolonged erections -restlessness, pacing, inability to keep still -seizures -stiff muscles -suicidal thoughts or other mood changes -trouble sleeping -unusual bleeding or bruising -unusually weak or tired -vomiting Side effects that usually do not require medical attention (report to your doctor or health care professional if they continue or are bothersome): -change in appetite or weight -change in sex drive or performance -dizziness -headache -increased sweating -indigestion, nausea -tremors This list may not describe all possible side effects. Call your doctor for medical advice about side effects. You may report side effects to FDA at 1-800-FDA-1088. Where should I keep my medicine? Keep out of reach of children. Store at room temperature between 15 and 30 degrees C (59 and 86 degrees F). Throw away any unused medicine after the expiration date. NOTE: This sheet is a summary. It may not cover all possible information. If you have questions about this medicine, talk to your doctor, pharmacist, or health care provider.  2018 Elsevier/Gold Standard (2016-05-15 13:18:52)

## 2018-09-17 ENCOUNTER — Telehealth: Payer: Self-pay | Admitting: Family Medicine

## 2018-09-17 NOTE — Telephone Encounter (Signed)
Pt called to let you know she is better and she is ok to go back to work.

## 2018-09-18 ENCOUNTER — Ambulatory Visit: Payer: BLUE CROSS/BLUE SHIELD | Admitting: Family Medicine

## 2018-12-25 ENCOUNTER — Other Ambulatory Visit: Payer: Self-pay

## 2018-12-25 ENCOUNTER — Emergency Department (HOSPITAL_COMMUNITY): Payer: BLUE CROSS/BLUE SHIELD

## 2018-12-25 ENCOUNTER — Encounter (HOSPITAL_COMMUNITY): Payer: Self-pay | Admitting: Emergency Medicine

## 2018-12-25 ENCOUNTER — Inpatient Hospital Stay (HOSPITAL_COMMUNITY)
Admission: EM | Admit: 2018-12-25 | Discharge: 2018-12-28 | DRG: 316 | Disposition: A | Discharge status: Home / Self Care | Payer: BLUE CROSS/BLUE SHIELD | Attending: Cardiology | Admitting: Cardiology

## 2018-12-25 DIAGNOSIS — I300 Acute nonspecific idiopathic pericarditis: Secondary | ICD-10-CM | POA: Diagnosis not present

## 2018-12-25 DIAGNOSIS — I213 ST elevation (STEMI) myocardial infarction of unspecified site: Secondary | ICD-10-CM | POA: Diagnosis not present

## 2018-12-25 DIAGNOSIS — Z9851 Tubal ligation status: Secondary | ICD-10-CM

## 2018-12-25 DIAGNOSIS — R079 Chest pain, unspecified: Secondary | ICD-10-CM

## 2018-12-25 DIAGNOSIS — Z79899 Other long term (current) drug therapy: Secondary | ICD-10-CM

## 2018-12-25 DIAGNOSIS — Z800 Family history of malignant neoplasm of digestive organs: Secondary | ICD-10-CM

## 2018-12-25 DIAGNOSIS — F17200 Nicotine dependence, unspecified, uncomplicated: Secondary | ICD-10-CM | POA: Diagnosis present

## 2018-12-25 DIAGNOSIS — F329 Major depressive disorder, single episode, unspecified: Secondary | ICD-10-CM | POA: Diagnosis present

## 2018-12-25 DIAGNOSIS — E782 Mixed hyperlipidemia: Secondary | ICD-10-CM | POA: Diagnosis present

## 2018-12-25 DIAGNOSIS — I10 Essential (primary) hypertension: Secondary | ICD-10-CM | POA: Diagnosis not present

## 2018-12-25 DIAGNOSIS — J069 Acute upper respiratory infection, unspecified: Secondary | ICD-10-CM | POA: Diagnosis not present

## 2018-12-25 DIAGNOSIS — I301 Infective pericarditis: Secondary | ICD-10-CM | POA: Diagnosis not present

## 2018-12-25 DIAGNOSIS — R0602 Shortness of breath: Secondary | ICD-10-CM | POA: Diagnosis not present

## 2018-12-25 DIAGNOSIS — I309 Acute pericarditis, unspecified: Principal | ICD-10-CM | POA: Diagnosis present

## 2018-12-25 DIAGNOSIS — D649 Anemia, unspecified: Secondary | ICD-10-CM | POA: Diagnosis not present

## 2018-12-25 DIAGNOSIS — F17210 Nicotine dependence, cigarettes, uncomplicated: Secondary | ICD-10-CM | POA: Diagnosis present

## 2018-12-25 DIAGNOSIS — R0781 Pleurodynia: Secondary | ICD-10-CM | POA: Diagnosis not present

## 2018-12-25 DIAGNOSIS — Z833 Family history of diabetes mellitus: Secondary | ICD-10-CM

## 2018-12-25 DIAGNOSIS — Z8249 Family history of ischemic heart disease and other diseases of the circulatory system: Secondary | ICD-10-CM

## 2018-12-25 LAB — CBC
HCT: 39.5 % (ref 36.0–46.0)
Hemoglobin: 12.6 g/dL (ref 12.0–15.0)
MCH: 27.8 pg (ref 26.0–34.0)
MCHC: 31.9 g/dL (ref 30.0–36.0)
MCV: 87 fL (ref 80.0–100.0)
Platelets: 192 10*3/uL (ref 150–400)
RBC: 4.54 MIL/uL (ref 3.87–5.11)
RDW: 13.5 % (ref 11.5–15.5)
WBC: 13 10*3/uL — ABNORMAL HIGH (ref 4.0–10.5)
nRBC: 0 % (ref 0.0–0.2)

## 2018-12-25 LAB — BASIC METABOLIC PANEL
Anion gap: 9 (ref 5–15)
BUN: 14 mg/dL (ref 6–20)
CO2: 26 mmol/L (ref 22–32)
Calcium: 9.3 mg/dL (ref 8.9–10.3)
Chloride: 107 mmol/L (ref 98–111)
Creatinine, Ser: 0.67 mg/dL (ref 0.44–1.00)
GFR calc Af Amer: >60 mL/min (ref >60)
GFR calc non Af Amer: >60 mL/min (ref >60)
Glucose, Bld: 100 mg/dL — ABNORMAL HIGH (ref 70–99)
Potassium: 3.4 mmol/L — ABNORMAL LOW (ref 3.5–5.1)
Sodium: 142 mmol/L (ref 135–145)

## 2018-12-25 LAB — I-STAT TROPONIN, ED: Troponin i, poc: 0 ng/mL (ref 0.00–0.08)

## 2018-12-25 IMAGING — CR DG CHEST 2V
2 series · 2 of 2 positions shown · non-contrast
Comparison: [DATE]

CLINICAL DATA: Chest pain

EXAM:
CHEST - 2 VIEW

[chest pa]
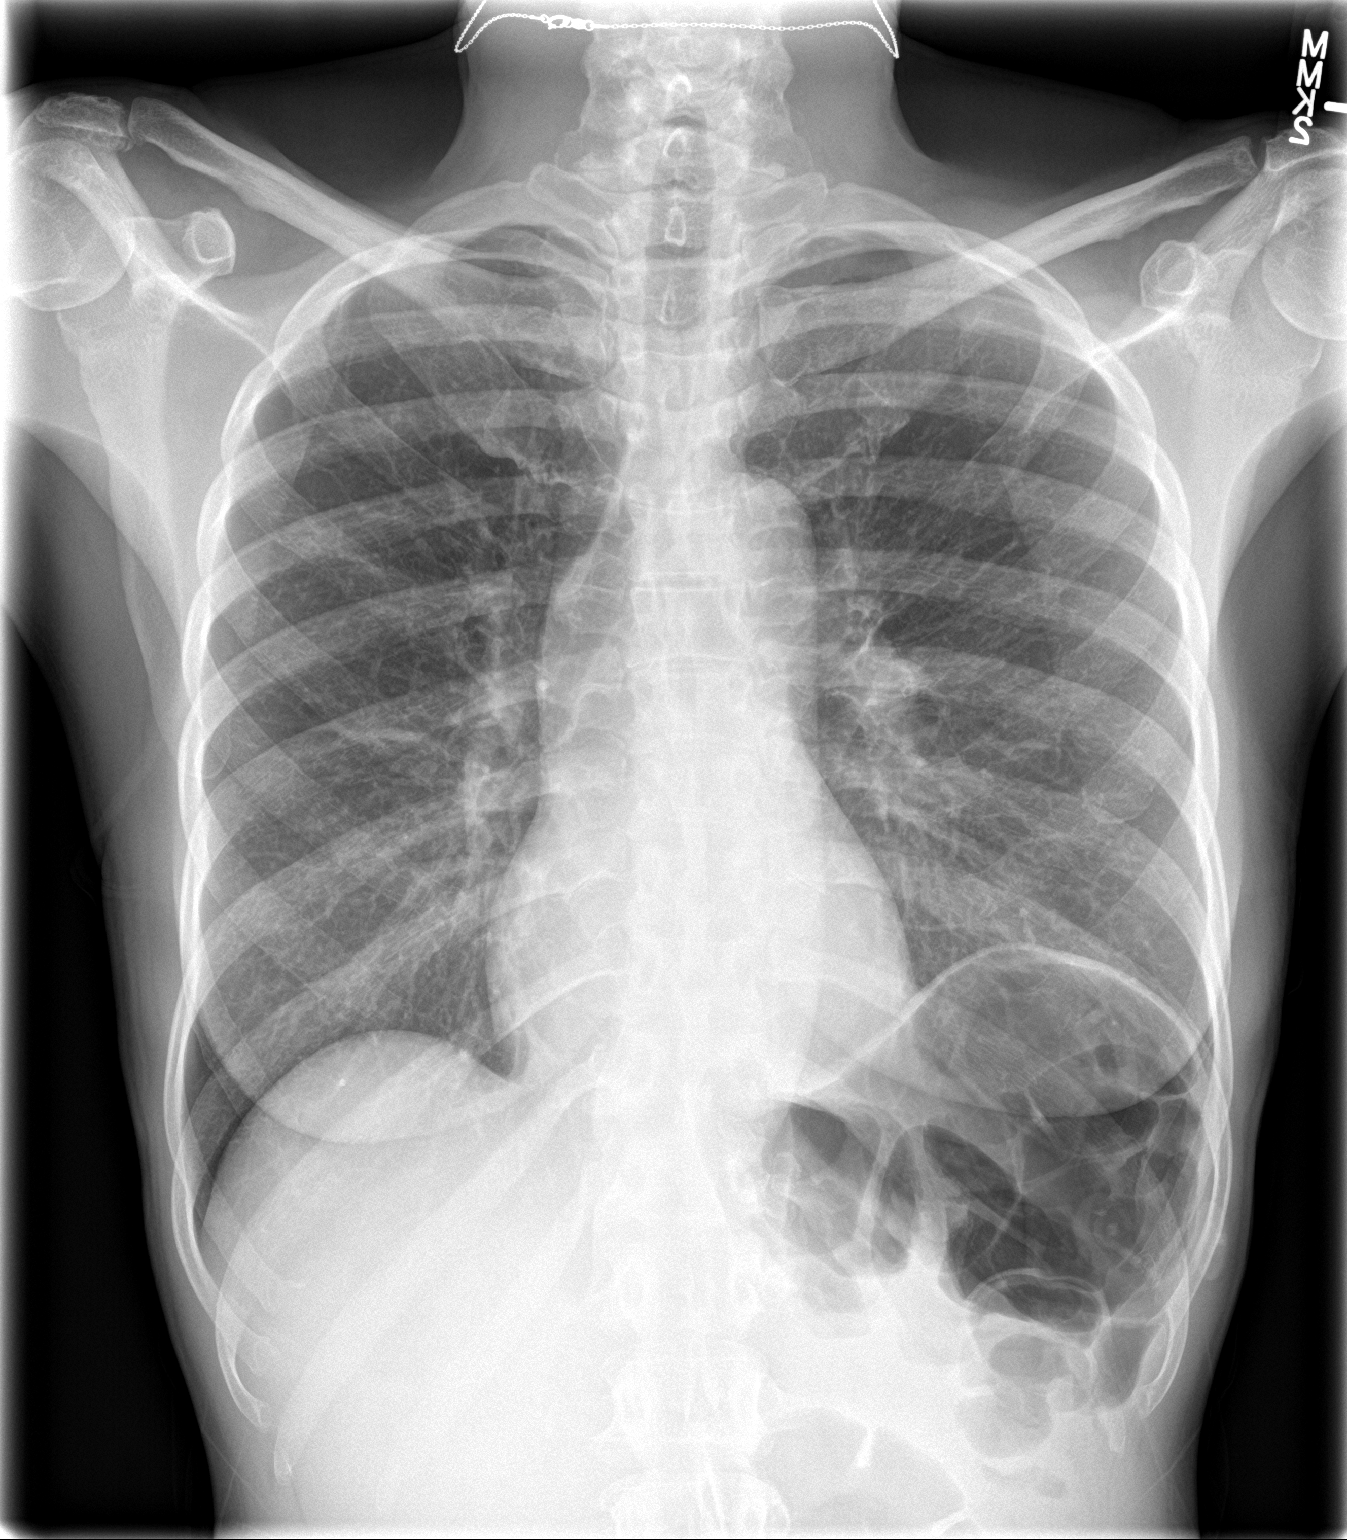

[chest lat]
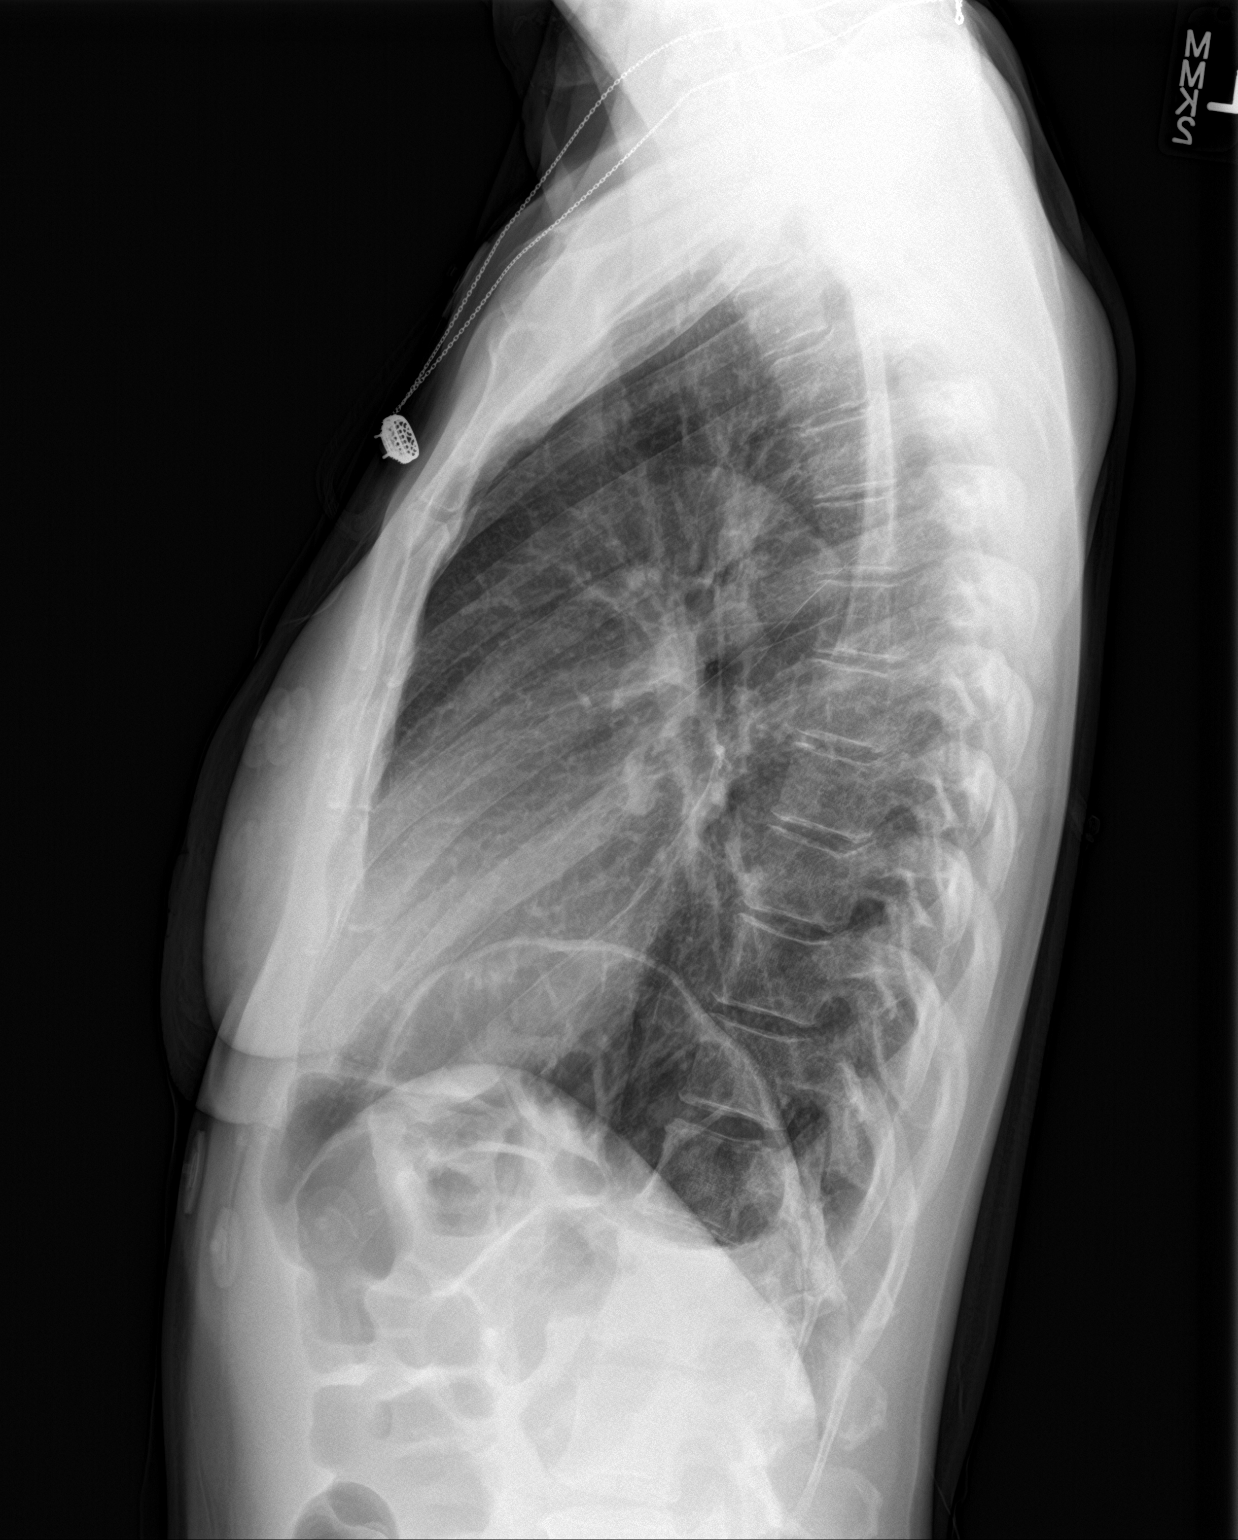

[2 of 2 positions shown; findings below may reference images not displayed]

FINDINGS: The heart size and mediastinal contours are within normal limits.
Both lungs are clear. The visualized skeletal structures are
unremarkable.
IMPRESSION: No active cardiopulmonary disease.

## 2018-12-25 NOTE — ED Triage Notes (Signed)
Pt reports 10/10 rt sided cp that radiates down her rt arm and rt neck that started a few hours ago. Pt reports pain upon inspiration and sob.

## 2018-12-26 ENCOUNTER — Ambulatory Visit (HOSPITAL_COMMUNITY): Payer: BLUE CROSS/BLUE SHIELD

## 2018-12-26 ENCOUNTER — Inpatient Hospital Stay (HOSPITAL_COMMUNITY): Payer: BLUE CROSS/BLUE SHIELD

## 2018-12-26 DIAGNOSIS — R079 Chest pain, unspecified: Secondary | ICD-10-CM

## 2018-12-26 DIAGNOSIS — Z833 Family history of diabetes mellitus: Secondary | ICD-10-CM | POA: Diagnosis not present

## 2018-12-26 DIAGNOSIS — F329 Major depressive disorder, single episode, unspecified: Secondary | ICD-10-CM | POA: Diagnosis present

## 2018-12-26 DIAGNOSIS — Z8249 Family history of ischemic heart disease and other diseases of the circulatory system: Secondary | ICD-10-CM | POA: Diagnosis not present

## 2018-12-26 DIAGNOSIS — R0602 Shortness of breath: Secondary | ICD-10-CM | POA: Diagnosis not present

## 2018-12-26 DIAGNOSIS — R0781 Pleurodynia: Secondary | ICD-10-CM | POA: Diagnosis not present

## 2018-12-26 DIAGNOSIS — Z79899 Other long term (current) drug therapy: Secondary | ICD-10-CM | POA: Diagnosis not present

## 2018-12-26 DIAGNOSIS — E782 Mixed hyperlipidemia: Secondary | ICD-10-CM | POA: Diagnosis present

## 2018-12-26 DIAGNOSIS — I301 Infective pericarditis: Secondary | ICD-10-CM | POA: Diagnosis not present

## 2018-12-26 DIAGNOSIS — D649 Anemia, unspecified: Secondary | ICD-10-CM | POA: Diagnosis present

## 2018-12-26 DIAGNOSIS — J069 Acute upper respiratory infection, unspecified: Secondary | ICD-10-CM | POA: Diagnosis present

## 2018-12-26 DIAGNOSIS — Z800 Family history of malignant neoplasm of digestive organs: Secondary | ICD-10-CM | POA: Diagnosis not present

## 2018-12-26 DIAGNOSIS — I309 Acute pericarditis, unspecified: Secondary | ICD-10-CM | POA: Diagnosis not present

## 2018-12-26 DIAGNOSIS — R0789 Other chest pain: Secondary | ICD-10-CM

## 2018-12-26 DIAGNOSIS — I10 Essential (primary) hypertension: Secondary | ICD-10-CM | POA: Diagnosis present

## 2018-12-26 DIAGNOSIS — Z9851 Tubal ligation status: Secondary | ICD-10-CM | POA: Diagnosis not present

## 2018-12-26 DIAGNOSIS — F17210 Nicotine dependence, cigarettes, uncomplicated: Secondary | ICD-10-CM | POA: Diagnosis present

## 2018-12-26 DIAGNOSIS — I300 Acute nonspecific idiopathic pericarditis: Secondary | ICD-10-CM | POA: Diagnosis not present

## 2018-12-26 LAB — TROPONIN I
Troponin I: <0.03 ng/mL (ref <0.03)
Troponin I: <0.03 ng/mL (ref <0.03)
Troponin I: <0.03 ng/mL (ref <0.03)

## 2018-12-26 LAB — BASIC METABOLIC PANEL
Anion gap: 8 (ref 5–15)
BUN: 9 mg/dL (ref 6–20)
CO2: 24 mmol/L (ref 22–32)
Calcium: 8.8 mg/dL — ABNORMAL LOW (ref 8.9–10.3)
Chloride: 107 mmol/L (ref 98–111)
Creatinine, Ser: 0.63 mg/dL (ref 0.44–1.00)
GFR calc Af Amer: >60 mL/min (ref >60)
GFR calc non Af Amer: >60 mL/min (ref >60)
Glucose, Bld: 119 mg/dL — ABNORMAL HIGH (ref 70–99)
Potassium: 4.1 mmol/L (ref 3.5–5.1)
Sodium: 139 mmol/L (ref 135–145)

## 2018-12-26 LAB — CBC
HCT: 36 % (ref 36.0–46.0)
Hemoglobin: 11.4 g/dL — ABNORMAL LOW (ref 12.0–15.0)
MCH: 27.9 pg (ref 26.0–34.0)
MCHC: 31.7 g/dL (ref 30.0–36.0)
MCV: 88 fL (ref 80.0–100.0)
Platelets: 162 10*3/uL (ref 150–400)
RBC: 4.09 MIL/uL (ref 3.87–5.11)
RDW: 13.6 % (ref 11.5–15.5)
WBC: 9.5 10*3/uL (ref 4.0–10.5)
nRBC: 0 % (ref 0.0–0.2)

## 2018-12-26 LAB — HEMOGLOBIN A1C
Hgb A1c MFr Bld: 4.9 % (ref 4.8–5.6)
Mean Plasma Glucose: 93.93 mg/dL

## 2018-12-26 LAB — BRAIN NATRIURETIC PEPTIDE: B Natriuretic Peptide: 44.2 pg/mL (ref 0.0–100.0)

## 2018-12-26 LAB — LIPID PANEL
Cholesterol: 116 mg/dL (ref 0–200)
HDL: 53 mg/dL (ref >40)
LDL Cholesterol: 60 mg/dL (ref 0–99)
Total CHOL/HDL Ratio: 2.2 RATIO
Triglycerides: 13 mg/dL (ref <150)
VLDL: 3 mg/dL (ref 0–40)

## 2018-12-26 LAB — TSH: TSH: 0.6 u[IU]/mL (ref 0.350–4.500)

## 2018-12-26 LAB — HIV ANTIBODY (ROUTINE TESTING W REFLEX): HIV Screen 4th Generation wRfx: NONREACTIVE

## 2018-12-26 LAB — SEDIMENTATION RATE: Sed Rate: 1 mm/hr (ref 0–22)

## 2018-12-26 IMAGING — CT CT ANGIO CHEST
2 of 7 series · 19 of 46 positions shown · IV contrast (APPLIED)
Comparison: Chest radiograph, [DATE]. Chest radiograph,
[DATE].

CLINICAL DATA: Chest pain and shortness of breath.

EXAM:
CT ANGIOGRAPHY CHEST WITH CONTRAST
TECHNIQUE: Multidetector CT imaging of the chest was performed using the
standard protocol during bolus administration of intravenous
contrast. Multiplanar CT image reconstructions and MIPs were
obtained to evaluate the vascular anatomy.
CONTRAST:  75mL [XC] IOPAMIDOL ([XC]) INJECTION 76%

[Series 6: thins · axial · 0.61mm/px · z∈[+1173,+1405]mm · 16 of 373 slices shown]
[im 21/373  lung]
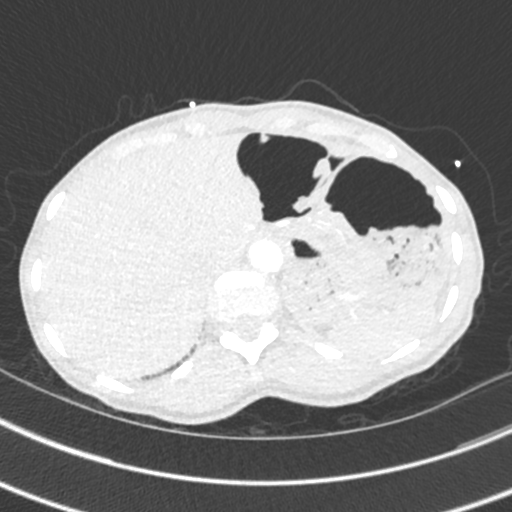
[im 42/373  soft-tissue]
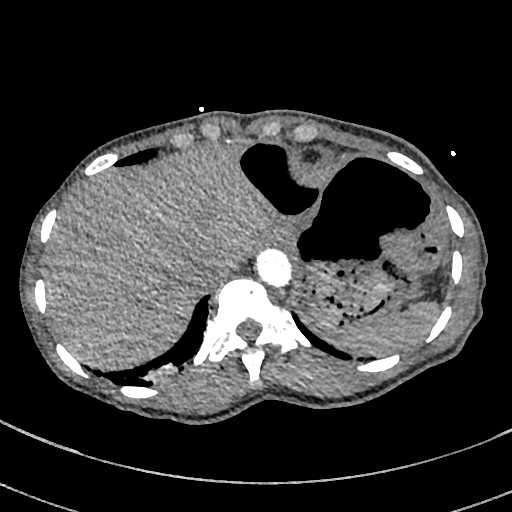
[im 63/373  lung]
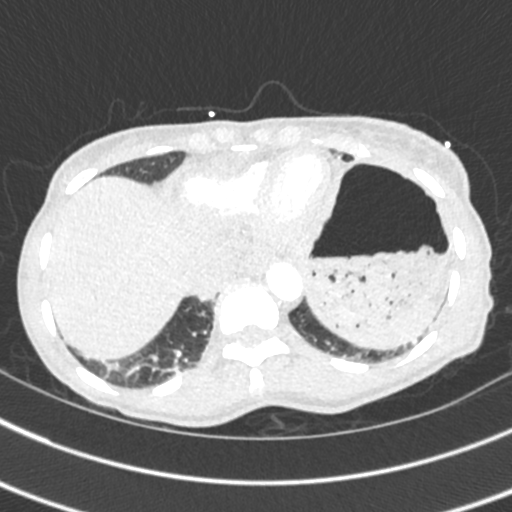
[im 83/373  soft-tissue]
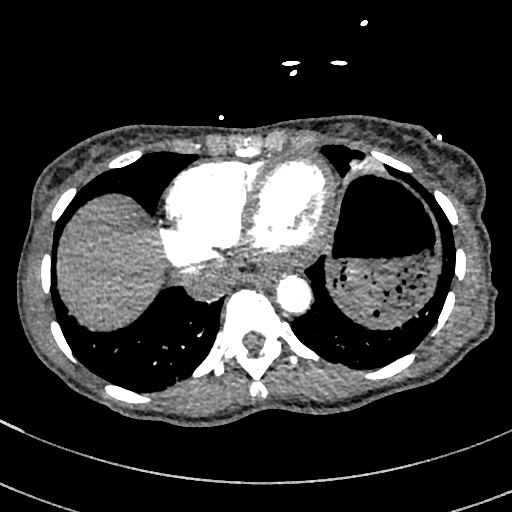
[im 104/373  lung]
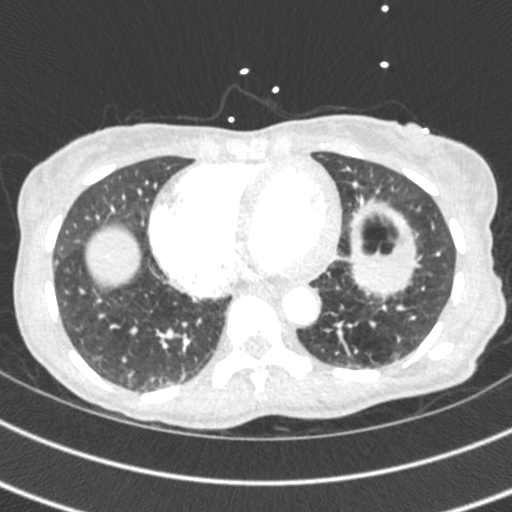
[im 125/373  soft-tissue]
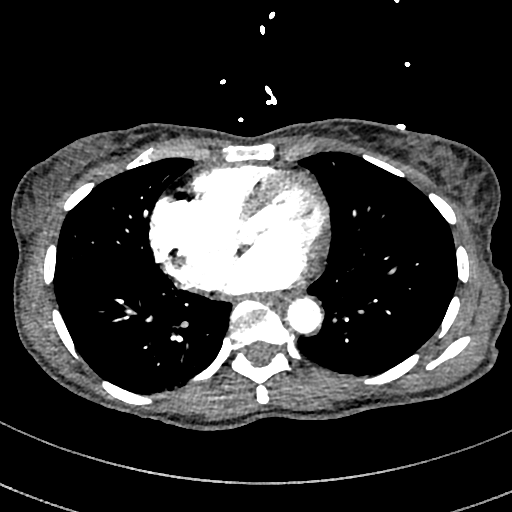
[im 145/373  lung]
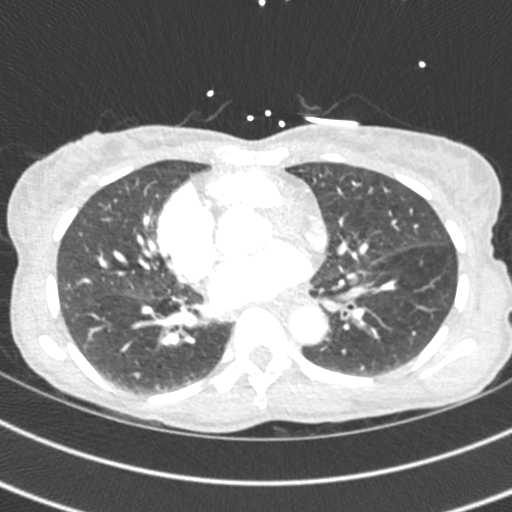
[im 166/373  soft-tissue]
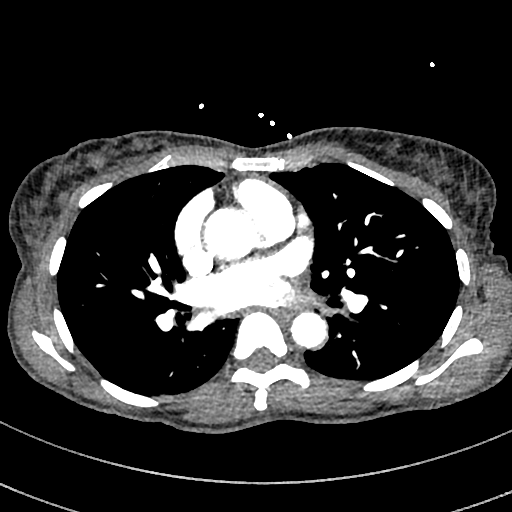
[im 207/373  lung]
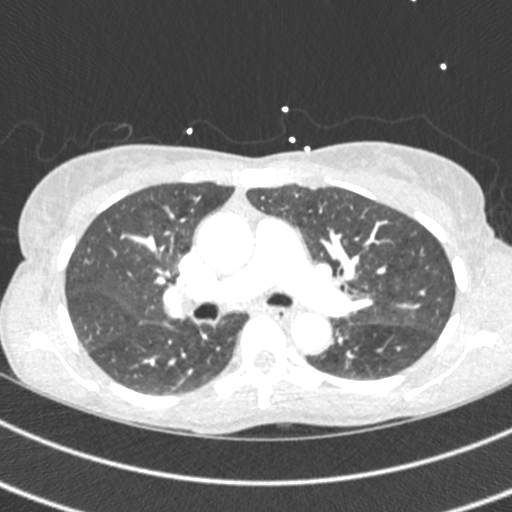
[im 228/373  soft-tissue]
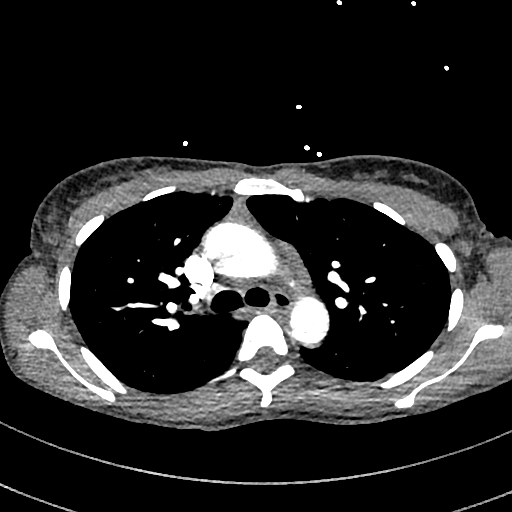
[im 249/373  lung]
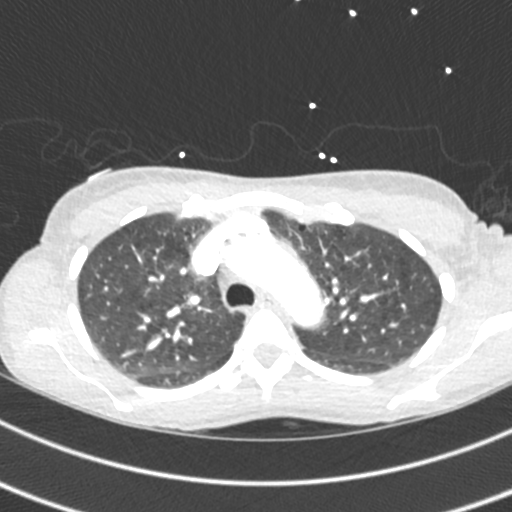
[im 269/373  soft-tissue]
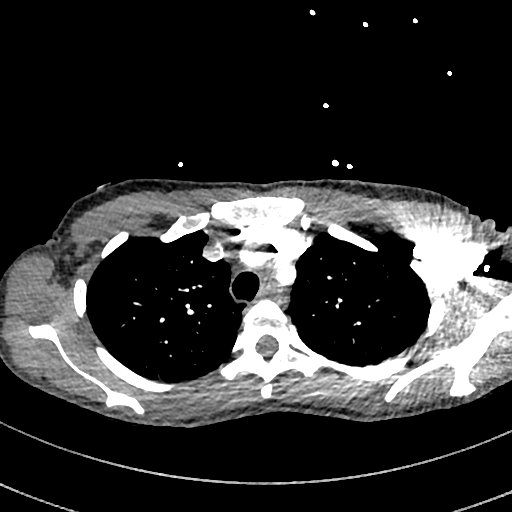
[im 290/373  lung]
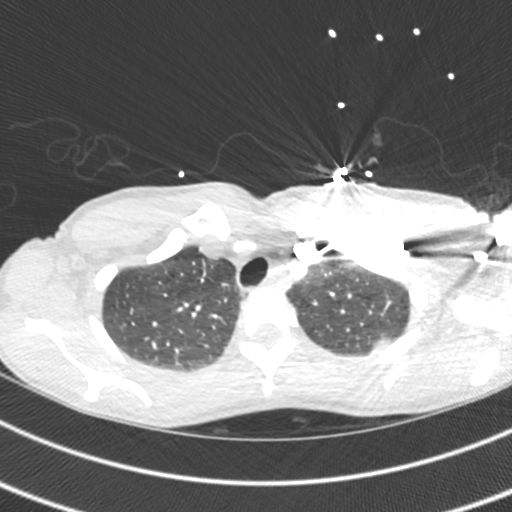
[im 311/373  soft-tissue]
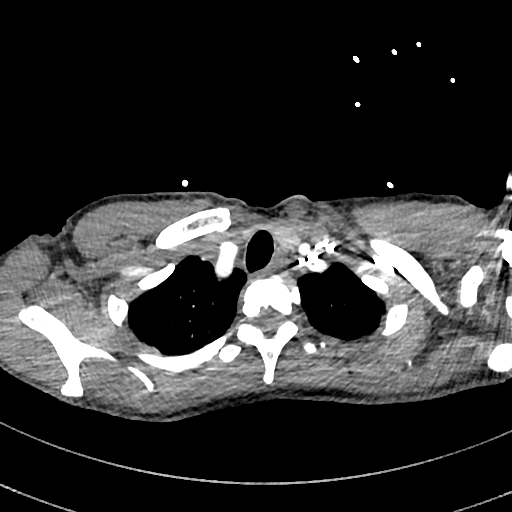
[im 331/373  lung]
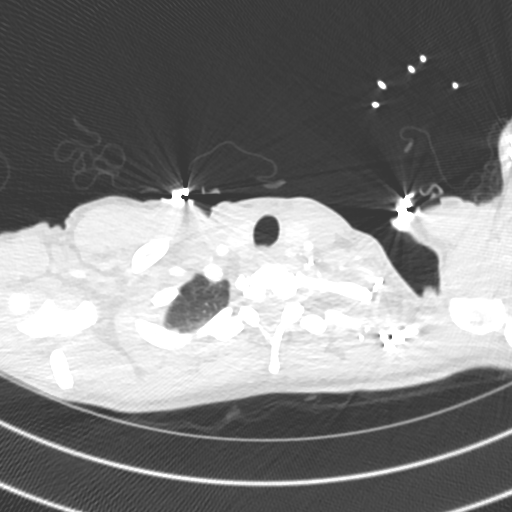
[im 352/373  soft-tissue]
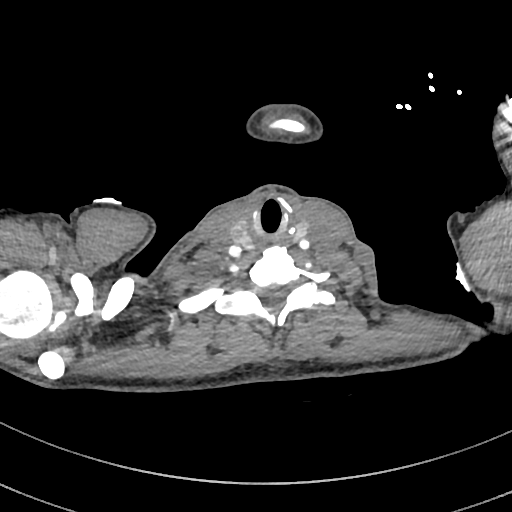

[Series 8: cor · coronal · 0.52mm/px · 3 of 111 slices shown]
[im 28/111  soft-tissue]
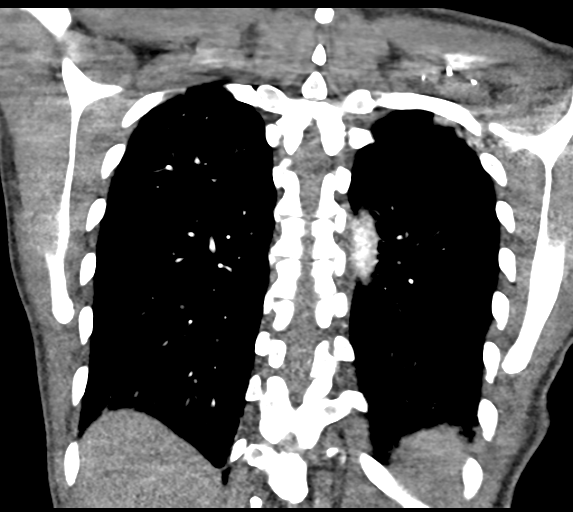
[im 56/111  soft-tissue]
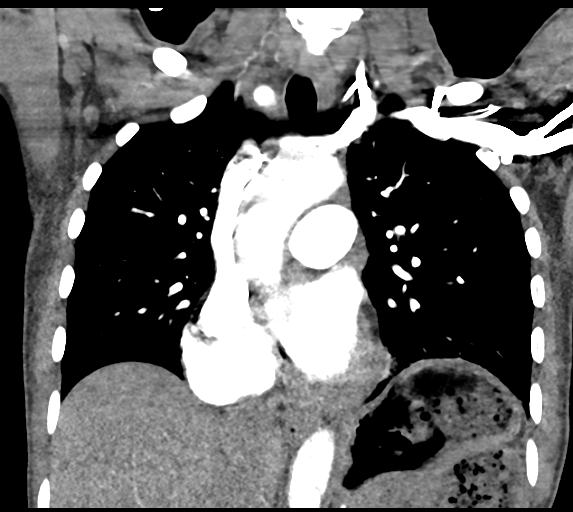
[im 83/111  soft-tissue]
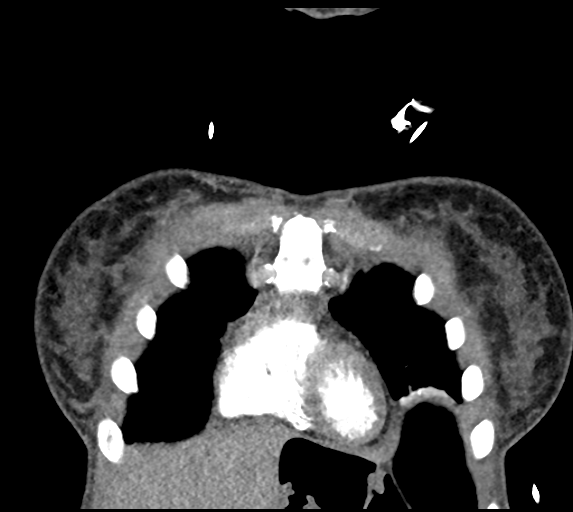

[19 of 46 positions shown; findings below may reference images not displayed]

FINDINGS: Cardiovascular: Satisfactory opacification of the pulmonary arteries
to the segmental level. No evidence of pulmonary embolism. Normal
heart size. No pericardial effusion. No coronary artery
calcifications. Aorta is normal in caliber. No dissection. No
atherosclerosis.

Mediastinum/Nodes: No neck base, axillary, mediastinal or hilar
masses or enlarged lymph nodes. Trachea and esophagus are
unremarkable.

Lungs/Pleura: Mild centrilobular emphysema. Minor dependent
subsegmental atelectasis in the lower lobes. No evidence of
pneumonia or pulmonary edema. No mass or nodule. No pleural effusion
or pneumothorax.

Upper Abdomen: No acute abnormality.

Musculoskeletal: No chest wall abnormality. No acute or significant
osseous findings.

Review of the MIP images confirms the above findings.
IMPRESSION: 1. No evidence of a pulmonary embolism.
2. No acute findings.  No evidence of pneumonia or pulmonary edema.
3. Mild centrilobular emphysema.

Emphysema ([XC]-[XC]).

## 2018-12-26 MED ORDER — COLCHICINE 0.6 MG PO TABS
0.6000 mg | ORAL_TABLET | Freq: Every day | ORAL | Status: DC
  Administered 2018-12-27 – 2018-12-28 (×2): 0.6 mg via ORAL
  Filled 2018-12-26 (×2): qty 1

## 2018-12-26 MED ORDER — ADULT MULTIVITAMIN W/MINERALS CH
1.0000 | ORAL_TABLET | Freq: Every day | ORAL | Status: DC
  Filled 2018-12-26 (×2): qty 1

## 2018-12-26 MED ORDER — ONDANSETRON HCL 4 MG/2ML IJ SOLN
4.0000 mg | Freq: Once | INTRAMUSCULAR | Status: AC
  Administered 2018-12-26: 4 mg via INTRAVENOUS
  Filled 2018-12-26: qty 2

## 2018-12-26 MED ORDER — IOPAMIDOL (ISOVUE-370) INJECTION 76%
75.0000 mL | Freq: Once | INTRAVENOUS | Status: AC | PRN
  Administered 2018-12-26: 75 mL via INTRAVENOUS

## 2018-12-26 MED ORDER — FENTANYL CITRATE (PF) 100 MCG/2ML IJ SOLN
100.0000 ug | Freq: Once | INTRAMUSCULAR | Status: AC
  Administered 2018-12-26: 100 ug via INTRAVENOUS
  Filled 2018-12-26: qty 2

## 2018-12-26 MED ORDER — HEPARIN SODIUM (PORCINE) 5000 UNIT/ML IJ SOLN
5000.0000 [IU] | Freq: Three times a day (TID) | INTRAMUSCULAR | Status: DC
  Administered 2018-12-26 – 2018-12-28 (×4): 5000 [IU] via SUBCUTANEOUS
  Filled 2018-12-26 (×5): qty 1

## 2018-12-26 MED ORDER — ASPIRIN 81 MG PO CHEW
324.0000 mg | CHEWABLE_TABLET | Freq: Once | ORAL | Status: DC
  Filled 2018-12-26: qty 4

## 2018-12-26 MED ORDER — NICOTINE 21 MG/24HR TD PT24
21.0000 mg | MEDICATED_PATCH | Freq: Every day | TRANSDERMAL | Status: DC
  Filled 2018-12-26 (×2): qty 1

## 2018-12-26 MED ORDER — COLCHICINE 0.6 MG PO TABS
0.6000 mg | ORAL_TABLET | Freq: Two times a day (BID) | ORAL | Status: AC
  Administered 2018-12-26 (×2): 0.6 mg via ORAL
  Filled 2018-12-26 (×2): qty 1

## 2018-12-26 MED ORDER — IBUPROFEN 800 MG PO TABS
800.0000 mg | ORAL_TABLET | Freq: Three times a day (TID) | ORAL | Status: DC
  Administered 2018-12-26 – 2018-12-28 (×6): 800 mg via ORAL
  Filled 2018-12-26 (×7): qty 1

## 2018-12-26 MED ORDER — PANTOPRAZOLE SODIUM 40 MG PO TBEC
40.0000 mg | DELAYED_RELEASE_TABLET | Freq: Every day | ORAL | Status: DC
  Administered 2018-12-26 – 2018-12-28 (×3): 40 mg via ORAL
  Filled 2018-12-26 (×3): qty 1

## 2018-12-26 MED ORDER — POTASSIUM CHLORIDE CRYS ER 10 MEQ PO TBCR: 40.0000 meq | EXTENDED_RELEASE_TABLET | Freq: Every day | ORAL | Status: DC

## 2018-12-26 MED ORDER — IOPAMIDOL (ISOVUE-370) INJECTION 76%
INTRAVENOUS | Status: AC
  Filled 2018-12-26: qty 100

## 2018-12-26 MED ORDER — ASPIRIN EC 81 MG PO TBEC
81.0000 mg | DELAYED_RELEASE_TABLET | Freq: Every day | ORAL | Status: DC
  Administered 2018-12-26: 81 mg via ORAL
  Filled 2018-12-26: qty 1

## 2018-12-26 NOTE — ED Notes (Signed)
Pt c/o epigastric pain since 1600 with nausea vomiting  Since 1600  She has pain in her rt shoulder also

## 2018-12-26 NOTE — ED Provider Notes (Signed)
[facility] EMERGENCY DEPARTMENT Provider Note   CSN: [id number] Arrival date & time: 12/25/18  2208     History   Chief Complaint Chief Complaint  Patient presents with  . Chest Pain  . Shortness of Breath    HPI Amanda Rich is a 53 y.o. female who presents with CC of cp. She had onset of of retrosternal CP onset about 9 pm .  She had aching retrosternal pain radiating into the left neck all evening.  Patient took a BC powder earlier which contains 870 mg of aspirin.  Patient while waiting in the waiting room had a sudden change in her pain which became suddenly severe, pressure-like.  The patient's sister gives more history and states that she suddenly began clutching her chest, gagging and retching, she became diaphoretic and pale with severe pain radiating into her left jaw, neck and left arm. The patient is a heavy smoker and has a hx of hyperlipidemia.  Marland Kitchen    HPI  Past Medical History:  Diagnosis Date  . Anemia   . Depression   . Smoker     Patient Active Problem List   Diagnosis Date Noted  . History of endometrial ablation 01/21/2018  . Family history of colon cancer 09/24/2017  . Poor diet 01/24/2017  . Weight loss, abnormal 01/24/2017  . Smoker 09/22/2016  . Mixed hyperlipidemia 08/07/2016  . Irregular intermenstrual bleeding 08/07/2016  . Depression 08/07/2016  . 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 Pelvic congestion syndrome 08/07/2016    Past Surgical History:  Procedure Laterality Date  . ABLATION ON ENDOMETRIOSIS    . GANGLION CYST EXCISION  2007   L wrist; [facility], [place]  . TUBAL LIGATION     [facility]  . UMBILICAL HERNIA REPAIR  2005   [place]     OB History   No obstetric history on file.      Home Medications    Prior to Admission medications   Medication Sig Start Date End Date Taking? Authorizing Provider  Biotin 5000 MCG CAPS Take by mouth.    [provider]  Cholecalciferol (VITAMIN D3) 2000  units TABS Take by mouth.    [provider]  citalopram (CELEXA) 20 MG tablet Take 1 tablet (20 mg total) by mouth daily. Patient not taking: Reported on 09/16/2018 06/05/18   Girtha Rm, NP-C  Melatonin 3 MG TABS Take by mouth.    [provider]  Multiple Vitamin (MULTIVITAMIN WITH MINERALS) TABS Take 1 tablet by mouth daily.    [provider]  nicotine (NICODERM CQ - DOSED IN MG/24 HOURS) 21 mg/24hr patch Place 21 mg onto the skin daily.    [provider]  TURMERIC PO Take by mouth.    [provider]    Family History Family History  Problem Relation Age of Onset  . Diabetes Mother   . Hypertension Mother   . Liver cancer Daughter   . Diabetes Maternal Aunt   . Colon cancer Maternal Uncle   . Colon cancer Cousin     Social History Social History   Tobacco Use  . Smoking status: Current Some Day Smoker    Packs/day: 1.00    Years: 25.00    Pack years: 25.00    Types: Cigarettes  . Smokeless tobacco: Never Used  . Tobacco comment: smoking cigarettes again and not e-cigs  Substance Use Topics  . Alcohol use: Yes    Alcohol/week: 1.0 standard drinks  Types: 1 Glasses of wine per week  . Drug use: No     Allergies   Clarithromycin   Review of Systems Review of Systems  Ten systems reviewed and are negative for acute change, except as noted in the HPI.   Physical Exam Updated Vital Signs BP 116/77   Pulse 73   Temp 97.7 F (36.5 C) (Oral)   Resp (!) 21   Ht 5\' 5"  (1.651 m)   Wt 52.2 kg   SpO2 100%   BMI 19.14 kg/m   Physical Exam Vitals signs and nursing note reviewed.  Constitutional:      General: She is in acute distress.     Appearance: She is well-developed. She is ill-appearing and diaphoretic.  HENT:     Head: Normocephalic and atraumatic.  Eyes:     General: No scleral icterus.    Conjunctiva/sclera: Conjunctivae normal.     Pupils: Pupils are equal, round, and reactive to light.  Neck:      Musculoskeletal: Normal range of motion.     Vascular: No JVD.  Cardiovascular:     Rate and Rhythm: Normal rate and regular rhythm.     Heart sounds: Normal heart sounds. No murmur. No friction rub. No gallop.   Pulmonary:     Effort: Pulmonary effort is normal. No respiratory distress.     Breath sounds: Normal breath sounds. No wheezing or rales.  Abdominal:     General: Bowel sounds are normal. There is no distension.     Palpations: Abdomen is soft. There is no mass.     Tenderness: There is no abdominal tenderness. There is no guarding.  Skin:    General: Skin is warm.  Neurological:     Mental Status: She is alert and oriented to person, place, and time.  Psychiatric:        Behavior: Behavior normal.      ED Treatments / Results  Labs (all labs ordered are listed, but only abnormal results are displayed) Labs Reviewed  BASIC METABOLIC PANEL - Abnormal; Notable for the following components:      Result Value   Potassium 3.4 (*)    Glucose, Bld 100 (*)    All other components within normal limits  CBC - Abnormal; Notable for the following components:   WBC 13.0 (*)    All other components within normal limits  I-STAT TROPONIN, ED    EKG EKG Interpretation  Date/Time:  Thursday December 26 2018 01:10:32 EST Ventricular Rate:  72 PR Interval:    QRS Duration: 82 QT Interval:  379 QTC Calculation: 415 R Axis:   59 Text Interpretation:  Sinus rhythm Consider left ventricular hypertrophy Inferior infarct, acute (LCx) ST elevation, consider anterior injury Lateral leads are also involved >>> Acute MI <<< Confirmed by Addison Lank [phone]) on 12/26/2018 1:17:56 AM   Radiology Dg Chest 2 View  Result Date: 12/25/2018 CLINICAL DATA:  Chest pain EXAM: CHEST - 2 VIEW COMPARISON:  09/14/2018 FINDINGS: The heart size and mediastinal contours are within normal limits. Both lungs are clear. The visualized skeletal structures are unremarkable. IMPRESSION: No active  cardiopulmonary disease. Electronically Signed   By: Donavan Foil M.D.   On: 12/25/2018 22:47    Procedures .Critical Care Performed by: Margarita Mail, PA-C Authorized by: Margarita Mail, PA-C   Critical care provider statement:    Critical care time (minutes):  30   Critical care was necessary to treat or prevent imminent or life-threatening deterioration of the  following conditions:  Cardiac failure   Critical care was time spent personally by me on the following activities:  Discussions with consultants, evaluation of patient's response to treatment, examination of patient, ordering and performing treatments and interventions, ordering and review of laboratory studies, ordering and review of radiographic studies, pulse oximetry, re-evaluation of patient's condition, obtaining history from patient or surrogate and review of old charts   (including critical care time)  Medications Ordered in ED Medications  aspirin chewable tablet 324 mg (324 mg Oral Not Given 12/26/18 0135)  ondansetron (ZOFRAN) injection 4 mg (4 mg Intravenous Given 12/26/18 0123)  fentaNYL (SUBLIMAZE) injection 100 mcg (100 mcg Intravenous Given 12/26/18 0143)     Initial Impression / Assessment and Plan / ED Course  I have reviewed the triage vital signs and the nursing notes.  Pertinent labs & imaging results that were available during my care of the patient were reviewed by me and considered in my medical decision making (see chart for details).     This is a 53 year old female who presents with chest pain.  She had an acute change while waiting in the lobby and EKG obtained upon arrival and repeat EKG shows significant change with ST segment elevation in the anterior leads.  Although her troponin is negative she appears diaphoretic, clutching chest, short of breath and vomiting.  Dr. Leonette Monarch and I saw the patient and initiated a code STEMI in conjunction with cardiology who will take the patient to the Cath Lab.   Patient already had aspirin and her BC powder today.  Her blood pressure was somewhat low earlier and nitroglycerin was held.  She was given fentanyl with significant improvement in her pain.  Final Clinical Impressions(s) / ED Diagnoses   Final diagnoses:  ST elevation myocardial infarction (STEMI), unspecified artery [facility])    ED Discharge Orders    None       Margarita Mail, PA-C 12/27/18 0657    Fatima Blank, MD 12/27/18 [phone]

## 2018-12-26 NOTE — Progress Notes (Signed)
   CT Chest without PE or other acute abnormalities. Patient still with complaints of chest pain. Will start ibuprofen 800mg  TID and PPI for GI ppx.   Abigail Butts, PA-C 12/26/18

## 2018-12-26 NOTE — ED Notes (Signed)
Pain "better"

## 2018-12-26 NOTE — ED Notes (Signed)
Report called to rn in pod f holding

## 2018-12-26 NOTE — ED Notes (Addendum)
Pt. Stated that she is having more chest pains all over, harder to breathe. Maintained 97% spo2.

## 2018-12-26 NOTE — ED Notes (Signed)
Cardiologist at bedside.  

## 2018-12-26 NOTE — ED Triage Notes (Signed)
Pt had syncopal episode in waiting area, pale diaphoretic

## 2018-12-26 NOTE — H&P (Addendum)
History & Physical    Patient ID: Amanda Rich MRN: [id number], DOB/AGE: June 07, 1966   Admit date: 12/25/2018   Primary Physician: Girtha Rm, NP-C Primary Cardiologist: No primary care provider on file.  Patient Profile    Amanda Rich is a 53 year old woman with history of tobacco abuse who presents due to chest pain.   Past Medical History    Past Medical History:  Diagnosis Date  . Anemia   . Depression   . Smoker     Past Surgical History:  Procedure Laterality Date  . ABLATION ON ENDOMETRIOSIS    . GANGLION CYST EXCISION  2007   L wrist; [facility], [place]  . TUBAL LIGATION     [facility]  . UMBILICAL HERNIA REPAIR  2005   [place]     Allergies  Allergies  Allergen Reactions  . Clarithromycin Swelling    Biaxin    History of Present Illness    Amanda Rich is a 53 year old woman with history of tobacco abuse who presents due to chest pain.   The patient reports pain was constant all day with fluctuations in intensity. Pain was initially mild and in the neck but then became severe chest pain. Patient describes pain as severe when she breathed, notes being scared to take a deep breath due to the severity of the pain. Pain radiated to back between her shoulder blades. Relieved somewhat by leaning forward. She was brought to the ED due to these symptoms and initial POC troponin 0.00. While in the ED, her chest pain suddenly became more severe and was associated with nausea and diaphoresis. ECG at that time showed diffuse 87m ST elevation and thus code STEMI was activated. Patient received 1067m of fentanyl and at the time of my evaluation reported near resolution of chest pain. Given ECG non-diagnostic for STEMI as well as symptoms with features of pericarditis, code STEMI was cancelled after discussion with Dr. JoMartinique  The patient denies any prior history of chest pain prior to her presentation today. Reports she has otherwise felt well. Denies shortness of breath,  orthopnea, PND, LEE, palpitations, syncope. Reports that she recently had her yearly physical and there were no issues raised.   Home Medications    Prior to Admission medications   Medication Sig Start Date End Date Taking? Authorizing Provider  Biotin 5000 MCG CAPS Take by mouth.    [provider]  Cholecalciferol (VITAMIN D3) 2000 units TABS Take by mouth.    [provider]  citalopram (CELEXA) 20 MG tablet Take 1 tablet (20 mg total) by mouth daily. Patient not taking: Reported on 09/16/2018 06/05/18   HeGirtha RmNP-C  Melatonin 3 MG TABS Take by mouth.    [provider]  Multiple Vitamin (MULTIVITAMIN WITH MINERALS) TABS Take 1 tablet by mouth daily.    [provider]  nicotine (NICODERM CQ - DOSED IN MG/24 HOURS) 21 mg/24hr patch Place 21 mg onto the skin daily.    [provider]  TURMERIC PO Take by mouth.    [provider]    Family History    Family History  Problem Relation Age of Onset  . Diabetes Mother   . Hypertension Mother   . Liver cancer Daughter   . Diabetes Maternal Aunt   . Colon cancer Maternal Uncle   . Colon cancer Cousin    She indicated that her mother is alive. She indicated that her father is deceased. She  indicated that both of her sisters are alive. She indicated that her daughter is alive. She indicated that her maternal aunt is alive. She indicated that her maternal uncle is deceased. She indicated that her cousin is alive.   Social History    Social History   Socioeconomic History  . Marital status: Legally Separated    Spouse name: Not on file  . Number of children: Not on file  . Years of education: Not on file  . Highest education level: Not on file  Occupational History  . Not on file  Social Needs  . Financial resource strain: Not on file  . Food insecurity:    Worry: Not on file    Inability: Not on file  . Transportation needs:    Medical: Not on file     Non-medical: Not on file  Tobacco Use  . Smoking status: Current Some Day Smoker    Packs/day: 1.00    Years: 25.00    Pack years: 25.00    Types: Cigarettes  . Smokeless tobacco: Never Used  . Tobacco comment: smoking cigarettes again and not e-cigs  Substance and Sexual Activity  . Alcohol use: Yes    Alcohol/week: 1.0 standard drinks    Types: 1 Glasses of wine per week  . Drug use: No  . Sexual activity: Not on file  Lifestyle  . Physical activity:    Days per week: Not on file    Minutes per session: Not on file  . Stress: Not on file  Relationships  . Social connections:    Talks on phone: Not on file    Gets together: Not on file    Attends religious service: Not on file    Active member of club or organization: Not on file    Attends meetings of clubs or organizations: Not on file    Relationship status: Not on file  . Intimate partner violence:    Fear of current or ex partner: Not on file    Emotionally abused: Not on file    Physically abused: Not on file    Forced sexual activity: Not on file  Other Topics Concern  . Not on file  Social History Narrative  . Not on file     Review of Systems    General:  No chills, fever, night sweats or weight changes.  Cardiovascular:  As per HPI Dermatological: No rash, lesions/masses Respiratory: No cough, dyspnea Urologic: No hematuria, dysuria Abdominal:   No nausea, vomiting, diarrhea, bright red blood per rectum, melena, or hematemesis Neurologic:  No visual changes, wkns, changes in mental status. All other systems reviewed and are otherwise negative except as noted above.  Physical Exam    Blood pressure 103/72, pulse 79, temperature 97.7 F (36.5 C), temperature source Oral, resp. rate 16, height _0  (1.651 m), weight 52.2 kg, SpO2 98 %.  General: Pleasant, NAD Psych: Normal affect. Neuro: Alert and oriented X 3. Moves all extremities spontaneously. HEENT: Normal  Neck: Supple without bruits or  JVD. Lungs:  Resp regular and unlabored, CTA. Heart: RRR no s3, s4, or murmurs. Abdomen: Soft, non-tender, non-distended, BS + x 4.  Extremities: No clubbing, cyanosis or edema. DP/PT/Radials 2+ and equal bilaterally.  Labs    Troponin [facility] Care Test) Recent Labs    12/25/18 2244  TROPIPOC 0.00   No results for input(s): CKTOTAL, CKMB, TROPONINI in the last 72 hours. Lab Results  Component Value Date   WBC 13.0 (H)  12/25/2018   HGB 12.6 12/25/2018   HCT 39.5 12/25/2018   MCV 87.0 12/25/2018   PLT 192 12/25/2018    Recent Labs  Lab 12/25/18 2228  NA 142  K 3.4*  CL 107  CO2 26  BUN 14  CREATININE 0.67  CALCIUM 9.3  GLUCOSE 100*   Lab Results  Component Value Date   CHOL 145 09/24/2017   HDL 58 09/24/2017   LDLCALC 73 09/24/2017   TRIG 62 09/24/2017   No results found for: [facility]   Radiology Studies    Dg Chest 2 View  Result Date: 12/25/2018 CLINICAL DATA:  Chest pain EXAM: CHEST - 2 VIEW COMPARISON:  09/14/2018 FINDINGS: The heart size and mediastinal contours are within normal limits. Both lungs are clear. The visualized skeletal structures are unremarkable. IMPRESSION: No active cardiopulmonary disease. Electronically Signed   By: Donavan Foil M.D.   On: 12/25/2018 22:47    ECG & Cardiac Imaging    ECG  - personally reviewed.  0110: Diffuse 0.5-44m upsloping STE in inferior, anterior and lateral leads. Compared with ECG earlier in the evening, STE is more pronounced.  07096 Redomonstration of upsloping STE, unchanged from prior and without ischemic evolution.  Assessment & Plan    Amanda Rich a 53year old woman with history of tobacco abuse who presents due to chest pain.   # Chest pain, likely pericarditis Initially activated as code STEMI in the ED due to acute change in symptoms while waiting. ECG with diffuse upsloping 0.5-127mST elevation. PR elevation in AVR and PR depression in multiple leads. Subsequently code STEMI cancelled due to  description of pain more consistent with pericarditis - pleuritic in nature, improved by leaning forward. Patient chest pain free at the time of my evaluation, although after significant dose of fentanyl. CAD risk factor only ongoing tobacco use.  - Trend ECGs and troponins, initial POC troponin 0.00 - Patient took ASA at home, will start 81 mg daily aspirin while ongoing workup - Hold on initiation of heparin or P2Y12 - ESR, CRP - Start colchicine for treatment of pericarditis  - TTE in AM - TSH, A1C, Lipid panel pending   Signed, AnBryna ColanderMD 12/26/2018, 3:41 AM

## 2018-12-26 NOTE — Progress Notes (Signed)
Reviewed admission note.  Admitted with pleuritic CP which is improved with leaning forward.  Trop neg x 3 and BNP normal.  CXRAY with NAD.  EKG with diffuse ST changes.  Puzzling piece is ESR is normal. Will check 2D echo.  Get chest CT to rule out PE.  Continue Colchicine 0.53m BID for now.  NSAIDs if CP reoccurs.

## 2018-12-27 ENCOUNTER — Inpatient Hospital Stay (HOSPITAL_COMMUNITY): Payer: BLUE CROSS/BLUE SHIELD

## 2018-12-27 DIAGNOSIS — I300 Acute nonspecific idiopathic pericarditis: Secondary | ICD-10-CM

## 2018-12-27 DIAGNOSIS — I309 Acute pericarditis, unspecified: Secondary | ICD-10-CM

## 2018-12-27 DIAGNOSIS — R0781 Pleurodynia: Secondary | ICD-10-CM

## 2018-12-27 DIAGNOSIS — R079 Chest pain, unspecified: Secondary | ICD-10-CM

## 2018-12-27 LAB — HIGH SENSITIVITY CRP: CRP, High Sensitivity: 7.23 mg/L — ABNORMAL HIGH (ref 0.00–3.00)

## 2018-12-27 LAB — ECHOCARDIOGRAM COMPLETE
Height: 65 in
Weight: 1776 oz

## 2018-12-27 MED ORDER — GADOBUTROL 1 MMOL/ML IV SOLN
6.0000 mL | Freq: Once | INTRAVENOUS | Status: AC | PRN
  Administered 2018-12-27: 6 mL via INTRAVENOUS

## 2018-12-27 MED ORDER — CITALOPRAM HYDROBROMIDE 10 MG PO TABS
10.0000 mg | ORAL_TABLET | Freq: Every day | ORAL | Status: DC
  Administered 2018-12-27 – 2018-12-28 (×2): 10 mg via ORAL
  Filled 2018-12-27: qty 1

## 2018-12-27 MED ORDER — CITALOPRAM HYDROBROMIDE 20 MG PO TABS
20.0000 mg | ORAL_TABLET | Freq: Every day | ORAL | Status: DC
  Filled 2018-12-27: qty 1

## 2018-12-27 MED ORDER — CITALOPRAM HYDROBROMIDE 10 MG PO TABS: 10.0000 mg | ORAL_TABLET | Freq: Every day | ORAL | Status: DC

## 2018-12-27 NOTE — Progress Notes (Signed)
Progress Note  Patient Name: Amanda Rich Date of Encounter: 12/27/2018  Primary Cardiologist: No primary care provider on file.   Subjective   Still having intermittent diffuse sharp chest pain that goes up into her neck and between her shoulder blades.  It only occurs with deep breathing.  She says she does feel somewhat better after starting colchicine and NSAIDs  Inpatient Medications    Scheduled Meds: . aspirin  324 mg Oral Once  . aspirin EC  81 mg Oral Daily  . colchicine  0.6 mg Oral Daily  . heparin  5,000 Units Subcutaneous Q8H  . ibuprofen  800 mg Oral TID  . multivitamin with minerals  1 tablet Oral Daily  . nicotine  21 mg Transdermal Daily  . pantoprazole  40 mg Oral Daily  . potassium chloride  40 mEq Oral Daily   Continuous Infusions:  PRN Meds:    Vital Signs    Vitals:   12/26/18 1000 12/26/18 1046 12/26/18 2037 12/27/18 0606  BP: 105/71 118/87 113/71 128/80  Pulse: 73 77 76 68  Resp: 16 20 18    Temp:  98.7 F (37.1 C) 99 F (37.2 C) 98.1 F (36.7 C)  TempSrc:  Oral Oral Oral  SpO2: 99% 100% 98% 99%  Weight:  50.4 kg  50.3 kg  Height:  5' 5"  (1.651 m)      Intake/Output Summary (Last 24 hours) at 12/27/2018 0856 Last data filed at 12/26/2018 1700 Gross per 24 hour  Intake 840 ml  Output -  Net 840 ml   Filed Weights   12/25/18 2214 12/26/18 1046 12/27/18 0606  Weight: 52.2 kg 50.4 kg 50.3 kg    Telemetry    Normal sinus rhythm- Personally Reviewed  ECG    No new EKG to review- Personally Reviewed  Physical Exam   GEN: No acute distress.   Neck: No JVD Cardiac: RRR, no murmurs, rubs, or gallops.  Respiratory: Clear to auscultation bilaterally. GI: Soft, nontender, non-distended  MS: No edema; No deformity. Neuro:  Nonfocal  Psych: Normal affect   Labs    Chemistry Recent Labs  Lab 12/25/18 2228 12/26/18 0646  NA 142 139  K 3.4* 4.1  CL 107 107  CO2 26 24  GLUCOSE 100* 119*  BUN 14 9  CREATININE 0.67 0.63    CALCIUM 9.3 8.8*  GFRNONAA >60 >60  GFRAA >60 >60  ANIONGAP 9 8     Hematology Recent Labs  Lab 12/25/18 2228 12/26/18 0646  WBC 13.0* 9.5  RBC 4.54 4.09  HGB 12.6 11.4*  HCT 39.5 36.0  MCV 87.0 88.0  MCH 27.8 27.9  MCHC 31.9 31.7  RDW 13.5 13.6  PLT 192 162    Cardiac Enzymes Recent Labs  Lab 12/26/18 0310 12/26/18 0843 12/26/18 1408  TROPONINI <0.03 <0.03 <0.03    Recent Labs  Lab 12/25/18 2244  TROPIPOC 0.00     BNP Recent Labs  Lab 12/26/18 0646  BNP 44.2     DDimer No results for input(s): DDIMER in the last 168 hours.   Radiology    Dg Chest 2 View  Result Date: 12/25/2018 CLINICAL DATA:  Chest pain EXAM: CHEST - 2 VIEW COMPARISON:  09/14/2018 FINDINGS: The heart size and mediastinal contours are within normal limits. Both lungs are clear. The visualized skeletal structures are unremarkable. IMPRESSION: No active cardiopulmonary disease. Electronically Signed   By: Donavan Foil M.D.   On: 12/25/2018 22:47   Ct Angio Chest Pe  W Or Wo Contrast  Result Date: 12/26/2018 CLINICAL DATA:  Chest pain and shortness of breath. EXAM: CT ANGIOGRAPHY CHEST WITH CONTRAST TECHNIQUE: Multidetector CT imaging of the chest was performed using the standard protocol during bolus administration of intravenous contrast. Multiplanar CT image reconstructions and MIPs were obtained to evaluate the vascular anatomy. CONTRAST:  74m ISOVUE-370 IOPAMIDOL (ISOVUE-370) INJECTION 76% COMPARISON:  Chest radiograph, 12/25/1998. Chest radiograph, 09/14/2018. FINDINGS: Cardiovascular: Satisfactory opacification of the pulmonary arteries to the segmental level. No evidence of pulmonary embolism. Normal heart size. No pericardial effusion. No coronary artery calcifications. Aorta is normal in caliber. No dissection. No atherosclerosis. Mediastinum/Nodes: No neck base, axillary, mediastinal or hilar masses or enlarged lymph nodes. Trachea and esophagus are unremarkable. Lungs/Pleura: Mild  centrilobular emphysema. Minor dependent subsegmental atelectasis in the lower lobes. No evidence of pneumonia or pulmonary edema. No mass or nodule. No pleural effusion or pneumothorax. Upper Abdomen: No acute abnormality. Musculoskeletal: No chest wall abnormality. No acute or significant osseous findings. Review of the MIP images confirms the above findings. IMPRESSION: 1. No evidence of a pulmonary embolism. 2. No acute findings.  No evidence of pneumonia or pulmonary edema. 3. Mild centrilobular emphysema. Emphysema (ICD10-J43.9). Electronically Signed   By: DLajean ManesM.D.   On: 12/26/2018 14:48    Cardiac Studies   2D echo pending  Patient Profile     53y.o. female with history of tobacco abuse who presents due to chest pain.    Assessment & Plan    1.  Atypical pleuritic chest pain/acute pericarditis -She has diffuse ST elevation with PR depression in multiple leads -Chest pain is sharp and occurs mainly with deep breathing -Felt to be due to acute pericarditis -ESR is normal but CRP is 7.23 -2D echocardiogram is pending -Troponin negative x3, BNP 44. -Pain seems to be worse today despite being started on high-dose NSAIDs and colchicine yesterday -Chest CT negative for PE and no coronary artery calcifications -Plan for cardiac MRI today to further assess -Continue on Motrin 600 mg 3 times daily and colchicine 0.6 mg twice daily for now -Stop ASA -Continue   Protonix 40 mg daily for GI prophylaxis  For questions or updates, please contact [facility] Please consult [email or web address] for contact info under Cardiology/STEMI.      Signed, TFransico Him MD  12/27/2018, 8:56 AM

## 2018-12-27 NOTE — Care Management (Signed)
#    9.  S/W SUSAN @ [facility] #  [phone]   COLCHICINE  0.6 MG DAILY COVER- YES CO-PAY- $ 60.00 TIER- NO PRIOR APPROVAL- NO  NO DEDUCTIBLE  PREFERRED PHARMACY : YES -  [facility]

## 2018-12-28 DIAGNOSIS — I301 Infective pericarditis: Secondary | ICD-10-CM

## 2018-12-28 MED ORDER — IBUPROFEN 800 MG PO TABS: 800.0000 mg | ORAL_TABLET | Freq: Three times a day (TID) | ORAL | 0 refills | Status: AC

## 2018-12-28 MED ORDER — COLCHICINE 0.6 MG PO TABS: 0.6000 mg | ORAL_TABLET | Freq: Every day | ORAL | 2 refills | Status: DC

## 2018-12-28 NOTE — Progress Notes (Signed)
Progress Note  Patient Name: Arlyn Dunning Date of Encounter: 12/28/2018  Primary Cardiologist: Fransico Him, MD   Subjective   Feeling little bit better.  Less pleuritic chest pain.  No shortness of breath.  ESR was normal but CRP was elevated. CT scan of chest showed no PE.  No pericardial effusion.  No coronary artery calcification.  Inpatient Medications    Scheduled Meds: . aspirin  324 mg Oral Once  . citalopram  10 mg Oral Daily  . colchicine  0.6 mg Oral Daily  . heparin  5,000 Units Subcutaneous Q8H  . ibuprofen  800 mg Oral TID  . multivitamin with minerals  1 tablet Oral Daily  . nicotine  21 mg Transdermal Daily  . pantoprazole  40 mg Oral Daily   Continuous Infusions:  PRN Meds:    Vital Signs    Vitals:   12/27/18 0606 12/27/18 1139 12/27/18 2050 12/28/18 0641  BP: 128/80 103/72 115/69 118/81  Pulse: 68 78 60 79  Resp:  20 20 20   Temp: 98.1 F (36.7 C) 98.4 F (36.9 C) 97.9 F (36.6 C) 98 F (36.7 C)  TempSrc: Oral Oral Oral Oral  SpO2: 99% 99% 99% 100%  Weight: 50.3 kg   51.2 kg  Height:       No intake or output data in the 24 hours ending 12/28/18 1312 Filed Weights   12/26/18 1046 12/27/18 0606 12/28/18 0641  Weight: 50.4 kg 50.3 kg 51.2 kg    Telemetry    Sinus rhythm- Personally Reviewed  ECG    On 12/26/2018- diffuse J-point elevation- Personally Reviewed  Physical Exam   GEN: No acute distress.   Neck: No JVD Cardiac: RRR, no murmurs, rubs, or gallops.  Respiratory: Clear to auscultation bilaterally. GI: Soft, nontender, non-distended  MS: No edema; No deformity. Neuro:  Nonfocal  Psych: Normal affect   Labs    Chemistry Recent Labs  Lab 12/25/18 2228 12/26/18 0646  NA 142 139  K 3.4* 4.1  CL 107 107  CO2 26 24  GLUCOSE 100* 119*  BUN 14 9  CREATININE 0.67 0.63  CALCIUM 9.3 8.8*  GFRNONAA >60 >60  GFRAA >60 >60  ANIONGAP 9 8     Hematology Recent Labs  Lab 12/25/18 2228 12/26/18 0646  WBC 13.0*  9.5  RBC 4.54 4.09  HGB 12.6 11.4*  HCT 39.5 36.0  MCV 87.0 88.0  MCH 27.8 27.9  MCHC 31.9 31.7  RDW 13.5 13.6  PLT 192 162    Cardiac Enzymes Recent Labs  Lab 12/26/18 0310 12/26/18 0843 12/26/18 1408  TROPONINI <0.03 <0.03 <0.03    Recent Labs  Lab 12/25/18 2244  TROPIPOC 0.00     BNP Recent Labs  Lab 12/26/18 0646  BNP 44.2     DDimer No results for input(s): DDIMER in the last 168 hours.   Radiology    Ct Angio Chest Pe W Or Wo Contrast  Result Date: 12/26/2018 CLINICAL DATA:  Chest pain and shortness of breath. EXAM: CT ANGIOGRAPHY CHEST WITH CONTRAST TECHNIQUE: Multidetector CT imaging of the chest was performed using the standard protocol during bolus administration of intravenous contrast. Multiplanar CT image reconstructions and MIPs were obtained to evaluate the vascular anatomy. CONTRAST:  72m ISOVUE-370 IOPAMIDOL (ISOVUE-370) INJECTION 76% COMPARISON:  Chest radiograph, 12/25/1998. Chest radiograph, 09/14/2018. FINDINGS: Cardiovascular: Satisfactory opacification of the pulmonary arteries to the segmental level. No evidence of pulmonary embolism. Normal heart size. No pericardial effusion. No coronary artery calcifications.  Aorta is normal in caliber. No dissection. No atherosclerosis. Mediastinum/Nodes: No neck base, axillary, mediastinal or hilar masses or enlarged lymph nodes. Trachea and esophagus are unremarkable. Lungs/Pleura: Mild centrilobular emphysema. Minor dependent subsegmental atelectasis in the lower lobes. No evidence of pneumonia or pulmonary edema. No mass or nodule. No pleural effusion or pneumothorax. Upper Abdomen: No acute abnormality. Musculoskeletal: No chest wall abnormality. No acute or significant osseous findings. Review of the MIP images confirms the above findings. IMPRESSION: 1. No evidence of a pulmonary embolism. 2. No acute findings.  No evidence of pneumonia or pulmonary edema. 3. Mild centrilobular emphysema. Emphysema  (ICD10-J43.9). Electronically Signed   By: Lajean Manes M.D.   On: 12/26/2018 14:48   Mr Cardiac Morphology W Wo Contrast  Result Date: 12/27/2018 CLINICAL DATA:  Pericarditis EXAM: CARDIAC MRI TECHNIQUE: The patient was scanned on a 1.5 Tesla GE magnet. A dedicated cardiac coil was used. Functional imaging was done using Fiesta sequences. 2,3, and 4 chamber views were done to assess for RWMA's. Modified Simpson's rule using a short axis stack was used to calculate an ejection fraction on a dedicated work Conservation officer, nature. The patient received 8 cc of Gadavist. After 10 minutes inversion recovery sequences were used to assess for infiltration and scar tissue. FINDINGS: Limited images of the lung fields show no gross abnormalities. No significant pericardial effusion. Normal left ventricular size and wall motion, EF 64%. Normal wall thickness. Normal right ventricular size and systolic function, EF 74%. Normal left and right atrial sizes. Trileaflet aortic valve with no significant stenosis or regurgitation. No significant mitral regurgitation noted. Mild tricuspid regurgitation. On delayed enhancement imaging, there is diffuse late gadolinium enhancement (LGE) of the pericardium. No myocardial LGE. Measurements: LVEDV 121 mL LVSV 77 mL LVEF 64% RVEDV 126 mL RVSV 72 mL RVEF 57% IMPRESSION: 1.  Normal LV size and systolic function, EF 16%. 2.  Normal RV size and systolic function, EF 38%. 3. No significant pericardial effusion. However, there is diffuse LGE involving the pericardium. This is suggestive of acute pericarditis. Dalton Mclean Electronically Signed   By: Loralie Champagne M.D.   On: 12/27/2018 17:29    Cardiac Studies   Echocardiogram 12/27/2018 - Left ventricle: The cavity size was normal. Systolic function was   normal. The estimated ejection fraction was in the range of 55%   to 60%. Wall motion was normal; there were no regional wall   motion abnormalities. Left ventricular  diastolic function   parameters were normal. - Mitral valve: There was trivial regurgitation. - Tricuspid valve: There was trivial regurgitation.  Cardiac MRI 12/27/2018 - Late gadolinium enhancement of pericardium consistent with acute pericarditis.  Normal EF.  Patient Profile     53 y.o. female with acute pericarditis.  Assessment & Plan    Acute pericarditis -Likely a result of recent upper respiratory infection -Pleuritic chest pain, diffuse ST elevation with PR depression.  No pericardial effusion.  No coronary artery calcification.  ESR is elevated 7.2.  EF normal. -No PE. -Cardiac MRI shows late gadolinium enhancement of pericardium consistent with acute pericarditis. -Continue with ibuprofen for 2 weeks, colchicine for 3 months.  We will stop aspirin.  Okay to use PPI, even Prilosec 20 mg once a day as outpatient while taking these anti-inflammatories.  Instructed her to take them with meals and to drink plenty of liquids.  As far as going back to work, she works as a Agricultural consultant at [employer].  I would like for her to  go back to work next Wednesday.  She is comfortable with this.  She would like for her work note to say okay to resume work next Wednesday.  She is ambulating well, chest discomfort is improving. Okay for discharge.  She understands not to involve herself in any extreme physical activity.  For questions or updates, please contact [place] Please consult [email or web address] for contact info under      Signed, Candee Furbish, MD  12/28/2018, 1:12 PM

## 2018-12-28 NOTE — Discharge Summary (Addendum)
Discharge Summary    Patient ID: Amanda Rich MRN: [id number]; DOB: [birth year]  Admit date: 12/25/2018 Discharge date: 12/28/2018  Primary Care Provider: Girtha Rm, NP-C  Primary Cardiologist: Fransico Him, MD  Primary Electrophysiologist:  None   Discharge Diagnoses    Principal Problem:   Chest pain Active Problems:   Mixed hyperlipidemia   Smoker   Allergies Allergies  Allergen Reactions  . Clarithromycin Swelling    Biaxin    Diagnostic Studies/Procedures    Echocardiogram 12/27/18 Study Conclusions  - Left ventricle: The cavity size was normal. Systolic function was   normal. The estimated ejection fraction was in the range of 55%   to 60%. Wall motion was normal; there were no regional wall   motion abnormalities. Left ventricular diastolic function   parameters were normal. - Mitral valve: There was trivial regurgitation. - Tricuspid valve: There was trivial regurgitation. _____________  Cardiac MRI 12/27/2018 FINDINGS: Limited images of the lung fields show no gross abnormalities.  No significant pericardial effusion. Normal left ventricular size and wall motion, EF 64%. Normal wall thickness. Normal right ventricular size and systolic function, EF 23%. Normal left and right atrial sizes. Trileaflet aortic valve with no significant stenosis or regurgitation. No significant mitral regurgitation noted. Mild tricuspid regurgitation.  On delayed enhancement imaging, there is diffuse late gadolinium enhancement (LGE) of the pericardium. No myocardial LGE.  Measurements: LVEDV 121 mL LVSV 77 mL LVEF 64% RVEDV 126 mL RVSV 72 mL RVEF 57%  IMPRESSION: 1.  Normal LV size and systolic function, EF 55%. 2.  Normal RV size and systolic function, EF 73%. 3. No significant pericardial effusion. However, there is diffuse LGE involving the pericardium. This is suggestive of acute pericarditis.  Amanda Rich   History of Present Illness       Amanda Rich is a 53 year old woman with history of tobacco abuse who presented on 12/26/2017 due to chest pain.   The patient reported pain was constant all day with fluctuations in intensity. Pain was initially mild and in the neck but then became severe chest pain. Patient described pain as severe when she breathed, noted being scared to take a deep breath due to the severity of the pain. Pain radiated to back between her shoulder blades. Relieved somewhat by leaning forward. She was brought to the ED due to these symptoms and initial POC troponin 0.00. While in the ED, her chest pain suddenly became more severe and was associated with nausea and diaphoresis. ECG at that time showed diffuse 59m ST elevation and thus code STEMI was activated. Patient received 1014m of fentanyl and at the time of my evaluation reported near resolution of chest pain. Given ECG non-diagnostic for STEMI as well as symptoms with features of pericarditis, code STEMI was cancelled after discussion with Dr. JoMartinique  The patient denies any prior history of chest pain prior to her presentation today. Reports she has otherwise felt well. Denies shortness of breath, orthopnea, PND, LEE, palpitations, syncope. Reports that she recently had her yearly physical and there were no issues raised.    Hospital Course     Consultants: none  Troponins remained normal.   Acute pericarditis -Likely a result of recent upper respiratory infection -Pleuritic chest pain, diffuse ST elevation with PR depression.  No pericardial effusion.  No coronary artery calcification.  ESR is elevated 7.2.  EF normal. -No PE. -Cardiac MRI shows late gadolinium enhancement of pericardium consistent with acute pericarditis. -Continue with ibuprofen for  2 weeks, colchicine for 3 months.  We will stop aspirin.  Okay to use PPI, even Prilosec 20 mg once a day as outpatient while taking these anti-inflammatories.  Instructed her to take them with meals and  to drink plenty of liquids.  As far as going back to work, she works as a Agricultural consultant at [employer].  Dr. Marlou Porch would like for her to go back to work next Wednesday.  She is comfortable with this.  Work note provided.  Patient has been seen by Dr. Marlou Porch today and deemed ready for discharge home. All follow up appointments have been scheduled. Discharge medications are listed below.  _____________  Discharge Vitals Blood pressure 118/81, pulse 79, temperature 98 F (36.7 C), temperature source Oral, resp. rate 20, height 5' 5"  (1.651 m), weight 51.2 kg, SpO2 100 %.  Filed Weights   12/26/18 1046 12/27/18 0606 12/28/18 0641  Weight: 50.4 kg 50.3 kg 51.2 kg    Labs & Radiologic Studies    CBC Recent Labs    12/25/18 2228 12/26/18 0646  WBC 13.0* 9.5  HGB 12.6 11.4*  HCT 39.5 36.0  MCV 87.0 88.0  PLT 192 161   Basic Metabolic Panel Recent Labs    12/25/18 2228 12/26/18 0646  NA 142 139  K 3.4* 4.1  CL 107 107  CO2 26 24  GLUCOSE 100* 119*  BUN 14 9  CREATININE 0.67 0.63  CALCIUM 9.3 8.8*   Liver Function Tests No results for input(s): AST, ALT, ALKPHOS, BILITOT, PROT, ALBUMIN in the last 72 hours. No results for input(s): LIPASE, AMYLASE in the last 72 hours. Cardiac Enzymes Recent Labs    12/26/18 0310 12/26/18 0843 12/26/18 1408  TROPONINI <0.03 <0.03 <0.03   BNP Invalid input(s): POCBNP D-Dimer No results for input(s): DDIMER in the last 72 hours. Hemoglobin A1C Recent Labs    12/26/18 0646  HGBA1C 4.9   Fasting Lipid Panel Recent Labs    12/26/18 0646  CHOL 116  HDL 53  LDLCALC 60  TRIG 13  CHOLHDL 2.2   Thyroid Function Tests Recent Labs    12/26/18 0646  TSH 0.600   _____________  Dg Chest 2 View  Result Date: 12/25/2018 CLINICAL DATA:  Chest pain EXAM: CHEST - 2 VIEW COMPARISON:  09/14/2018 FINDINGS: The heart size and mediastinal contours are within normal limits. Both lungs are clear. The visualized skeletal structures are  unremarkable. IMPRESSION: No active cardiopulmonary disease. Electronically Signed   By: Donavan Foil M.D.   On: 12/25/2018 22:47   Ct Angio Chest Pe W Or Wo Contrast  Result Date: 12/26/2018 CLINICAL DATA:  Chest pain and shortness of breath. EXAM: CT ANGIOGRAPHY CHEST WITH CONTRAST TECHNIQUE: Multidetector CT imaging of the chest was performed using the standard protocol during bolus administration of intravenous contrast. Multiplanar CT image reconstructions and MIPs were obtained to evaluate the vascular anatomy. CONTRAST:  34m ISOVUE-370 IOPAMIDOL (ISOVUE-370) INJECTION 76% COMPARISON:  Chest radiograph, 12/25/1998. Chest radiograph, 09/14/2018. FINDINGS: Cardiovascular: Satisfactory opacification of the pulmonary arteries to the segmental level. No evidence of pulmonary embolism. Normal heart size. No pericardial effusion. No coronary artery calcifications. Aorta is normal in caliber. No dissection. No atherosclerosis. Mediastinum/Nodes: No neck base, axillary, mediastinal or hilar masses or enlarged lymph nodes. Trachea and esophagus are unremarkable. Lungs/Pleura: Mild centrilobular emphysema. Minor dependent subsegmental atelectasis in the lower lobes. No evidence of pneumonia or pulmonary edema. No mass or nodule. No pleural effusion or pneumothorax. Upper Abdomen: No acute abnormality. Musculoskeletal: No  chest wall abnormality. No acute or significant osseous findings. Review of the MIP images confirms the above findings. IMPRESSION: 1. No evidence of a pulmonary embolism. 2. No acute findings.  No evidence of pneumonia or pulmonary edema. 3. Mild centrilobular emphysema. Emphysema (ICD10-J43.9). Electronically Signed   By: Lajean Manes M.D.   On: 12/26/2018 14:48   Mr Cardiac Morphology W Wo Contrast  Result Date: 12/27/2018 CLINICAL DATA:  Pericarditis EXAM: CARDIAC MRI TECHNIQUE: The patient was scanned on a 1.5 Tesla GE magnet. A dedicated cardiac coil was used. Functional imaging was done  using Fiesta sequences. 2,3, and 4 chamber views were done to assess for RWMA's. Modified Simpson's rule using a short axis stack was used to calculate an ejection fraction on a dedicated work Conservation officer, nature. The patient received 8 cc of Gadavist. After 10 minutes inversion recovery sequences were used to assess for infiltration and scar tissue. FINDINGS: Limited images of the lung fields show no gross abnormalities. No significant pericardial effusion. Normal left ventricular size and wall motion, EF 64%. Normal wall thickness. Normal right ventricular size and systolic function, EF 79%. Normal left and right atrial sizes. Trileaflet aortic valve with no significant stenosis or regurgitation. No significant mitral regurgitation noted. Mild tricuspid regurgitation. On delayed enhancement imaging, there is diffuse late gadolinium enhancement (LGE) of the pericardium. No myocardial LGE. Measurements: LVEDV 121 mL LVSV 77 mL LVEF 64% RVEDV 126 mL RVSV 72 mL RVEF 57% IMPRESSION: 1.  Normal LV size and systolic function, EF 39%. 2.  Normal RV size and systolic function, EF 03%. 3. No significant pericardial effusion. However, there is diffuse LGE involving the pericardium. This is suggestive of acute pericarditis. Amanda Rich Electronically Signed   By: Loralie Champagne M.D.   On: 12/27/2018 17:29   Disposition   Pt is being discharged home today in good condition.  Follow-up Plans & Appointments    Follow-up Information    Sueanne Margarita, MD Follow up.   Specialty:  Cardiology Why:  The office will call you to schedule hospital follow up.  Contact information: [address] [place] [address] [phone]          Discharge Instructions    Diet - low sodium heart healthy   Complete by:  As directed    Discharge instructions   Complete by:  As directed    Continue with ibuprofen for 2 weeks, colchicine for 3 months.  We will stop aspirin.  Okay to use Prilosec  20 mg once a day  while taking these anti-inflammatories.  Take these medications with meals and drink plenty of liquids.  May go back to work next Wednesday.   Avoid any extreme physical activity.   Increase activity slowly   Complete by:  As directed       Discharge Medications   Allergies as of 12/28/2018      Reactions   Clarithromycin Swelling   Biaxin      Medication List    STOP taking these medications   aspirin EC 81 MG tablet     TAKE these medications   Biotin 5000 MCG Caps Take 1 capsule by mouth daily.   citalopram 20 MG tablet Commonly known as:  CELEXA Take 1 tablet (20 mg total) by mouth daily. What changed:  how much to take   colchicine 0.6 MG tablet Take 1 tablet (0.6 mg total) by mouth daily. Start taking on:  December 29, 2018   ibuprofen  800 MG tablet Commonly known as:  ADVIL,MOTRIN Take 1 tablet (800 mg total) by mouth 3 (three) times daily for 14 days.   multivitamin with minerals Tabs tablet Take 1 tablet by mouth daily.   nicotine 21 mg/24hr patch Commonly known as:  NICODERM CQ - dosed in mg/24 hours Place 21 mg onto the skin daily as needed (smoking cessation).   TURMERIC PO Take 1 tablet by mouth daily.   Vitamin D3 50 MCG (2000 UT) Tabs Take 1 tablet by mouth daily.        Acute coronary syndrome (MI, NSTEMI, STEMI, etc) this admission?: No.    Outstanding Labs/Studies   none  Duration of Discharge Encounter   Greater than 30 minutes including physician time.  Signed, Daune Perch, NP 12/28/2018, 5:42 PM  Personally seen and examined. Agree with above.  Primary Cardiologist: Fransico Him, MD   Subjective   Feeling better.  Less pleuritic chest pain.  No shortness of breath.  ESR was normal but CRP was elevated. CT scan of chest showed no PE.  No pericardial effusion.  No coronary artery calcification.  Inpatient Medications    Scheduled Meds: . aspirin  324 mg Oral Once  . citalopram  10 mg Oral Daily   . colchicine  0.6 mg Oral Daily  . heparin  5,000 Units Subcutaneous Q8H  . ibuprofen  800 mg Oral TID  . multivitamin with minerals  1 tablet Oral Daily  . nicotine  21 mg Transdermal Daily  . pantoprazole  40 mg Oral Daily   Continuous Infusions: PRN Meds:    Vital Signs          Vitals:   12/27/18 0606 12/27/18 1139 12/27/18 2050 12/28/18 0641  BP: 128/80 103/72 115/69 118/81  Pulse: 68 78 60 79  Resp:  20 20 20   Temp: 98.1 F (36.7 C) 98.4 F (36.9 C) 97.9 F (36.6 C) 98 F (36.7 C)  TempSrc: Oral Oral Oral Oral  SpO2: 99% 99% 99% 100%  Weight: 50.3 kg   51.2 kg  Height:       No intake or output data in the 24 hours ending 12/28/18 1312      Filed Weights   12/26/18 1046 12/27/18 0606 12/28/18 0641  Weight: 50.4 kg 50.3 kg 51.2 kg    Telemetry    Sinus rhythm- Personally Reviewed  ECG    On 12/26/2018- diffuse J-point elevation- Personally Reviewed  Physical Exam   GEN:No acute distress.   Neck:No JVD Cardiac:RRR, no murmurs, rubs, or gallops.  Respiratory:Clear to auscultation bilaterally. BJ:YNWG, nontender, non-distended  MS:No edema; No deformity. Neuro:Nonfocal  Psych: Normal affect   Labs    Chemistry LastLabs      Recent Labs  Lab 12/25/18 2228 12/26/18 0646  NA 142 139  K 3.4* 4.1  CL 107 107  CO2 26 24  GLUCOSE 100* 119*  BUN 14 9  CREATININE 0.67 0.63  CALCIUM 9.3 8.8*  GFRNONAA >60 >60  GFRAA >60 >60  ANIONGAP 9 8       Hematology LastLabs  Recent Labs  Lab 12/25/18 2228 12/26/18 0646  WBC 13.0* 9.5  RBC 4.54 4.09  HGB 12.6 11.4*  HCT 39.5 36.0  MCV 87.0 88.0  MCH 27.8 27.9  MCHC 31.9 31.7  RDW 13.5 13.6  PLT 192 162      Cardiac Enzymes LastLabs       Recent Labs  Lab 12/26/18 0310 12/26/18 0843 12/26/18 1408  TROPONINI <0.03 <0.03 <  0.03      LastLabs     Recent Labs  Lab 12/25/18 2244  TROPIPOC 0.00       BNP LastLabs     Recent Labs  Lab  12/26/18 0646  BNP 44.2       DDimer  LastLabs  No results for input(s): DDIMER in the last 168 hours.     Radiology     ImagingResults(Last48hours)  Ct Angio Chest Pe W Or Wo Contrast  Result Date: 12/26/2018 CLINICAL DATA:  Chest pain and shortness of breath. EXAM: CT ANGIOGRAPHY CHEST WITH CONTRAST TECHNIQUE: Multidetector CT imaging of the chest was performed using the standard protocol during bolus administration of intravenous contrast. Multiplanar CT image reconstructions and MIPs were obtained to evaluate the vascular anatomy. CONTRAST:  75m ISOVUE-370 IOPAMIDOL (ISOVUE-370) INJECTION 76% COMPARISON:  Chest radiograph, 12/25/1998. Chest radiograph, 09/14/2018. FINDINGS: Cardiovascular: Satisfactory opacification of the pulmonary arteries to the segmental level. No evidence of pulmonary embolism. Normal heart size. No pericardial effusion. No coronary artery calcifications. Aorta is normal in caliber. No dissection. No atherosclerosis. Mediastinum/Nodes: No neck base, axillary, mediastinal or hilar masses or enlarged lymph nodes. Trachea and esophagus are unremarkable. Lungs/Pleura: Mild centrilobular emphysema. Minor dependent subsegmental atelectasis in the lower lobes. No evidence of pneumonia or pulmonary edema. No mass or nodule. No pleural effusion or pneumothorax. Upper Abdomen: No acute abnormality. Musculoskeletal: No chest wall abnormality. No acute or significant osseous findings. Review of the MIP images confirms the above findings. IMPRESSION: 1. No evidence of a pulmonary embolism. 2. No acute findings.  No evidence of pneumonia or pulmonary edema. 3. Mild centrilobular emphysema. Emphysema (ICD10-J43.9). Electronically Signed   By: DLajean ManesM.D.   On: 12/26/2018 14:48   Mr Cardiac Morphology W Wo Contrast  Result Date: 12/27/2018 CLINICAL DATA:  Pericarditis EXAM: CARDIAC MRI TECHNIQUE: The patient was scanned on a 1.5 Tesla GE magnet. A dedicated cardiac  coil was used. Functional imaging was done using Fiesta sequences. 2,3, and 4 chamber views were done to assess for RWMA's. Modified Simpson's rule using a short axis stack was used to calculate an ejection fraction on a dedicated work sConservation officer, nature The patient received 8 cc of Gadavist. After 10 minutes inversion recovery sequences were used to assess for infiltration and scar tissue. FINDINGS: Limited images of the lung fields show no gross abnormalities. No significant pericardial effusion. Normal left ventricular size and wall motion, EF 64%. Normal wall thickness. Normal right ventricular size and systolic function, EF 579% Normal left and right atrial sizes. Trileaflet aortic valve with no significant stenosis or regurgitation. No significant mitral regurgitation noted. Mild tricuspid regurgitation. On delayed enhancement imaging, there is diffuse late gadolinium enhancement (LGE) of the pericardium. No myocardial LGE. Measurements: LVEDV 121 mL LVSV 77 mL LVEF 64% RVEDV 126 mL RVSV 72 mL RVEF 57% IMPRESSION: 1.  Normal LV size and systolic function, EF 602% 2.  Normal RV size and systolic function, EF 540% 3. No significant pericardial effusion. However, there is diffuse LGE involving the pericardium. This is suggestive of acute pericarditis. Amanda Rich Electronically Signed   By: DLoralie ChampagneM.D.   On: 12/27/2018 17:29     Cardiac Studies   Echocardiogram 12/27/2018 - Left ventricle: The cavity size was normal. Systolic function was normal. The estimated ejection fraction was in the range of 55% to 60%. Wall motion was normal; there were no regional wall motion abnormalities. Left ventricular diastolic function parameters were normal. - Mitral valve: There  was trivial regurgitation. - Tricuspid valve: There was trivial regurgitation.  Cardiac MRI 12/27/2018 - Late gadolinium enhancement of pericardium consistent with acute pericarditis.  Normal EF.  Patient  Profile     53 y.o. female with acute pericarditis.  Assessment & Plan    Acute pericarditis -Likely a result of recent upper respiratory infection -Pleuritic chest pain, diffuse ST elevation with PR depression.  No pericardial effusion.  No coronary artery calcification.  ESR is elevated 7.2.  EF normal. -No PE. -Cardiac MRI shows late gadolinium enhancement of pericardium consistent with acute pericarditis. -Continue with ibuprofen for 2 weeks, colchicine for 3 months.  We will stop aspirin.  Okay to use PPI, even Prilosec 20 mg once a day as outpatient while taking these anti-inflammatories.  Instructed her to take them with meals and to drink plenty of liquids.  As far as going back to work, she works as a Agricultural consultant at [employer].  I would like for her to go back to work next Wednesday.  She is comfortable with this.  She would like for her work note to say okay to resume work next Wednesday.  She is ambulating well, chest discomfort is improving. Okay for discharge.  She understands not to involve herself in any extreme physical activity.  For questions or updates, please contact [place] Please consult [email or web address] for contact info under      Signed, Candee Furbish, MD

## 2018-12-28 NOTE — Discharge Instructions (Signed)
Pericarditis  Pericarditis is inflammation of the pericardium. The pericardium is a thin, double-layered, fluid-filled sac that surrounds your heart. The pericardium protects and holds your heart in your chest cavity. Inflammation of your pericardium can cause rubbing (friction) between the two layers when your heart beats. Fluid may also build up between the layers of the sac (pericardial effusion). There are three types of this condition:  Acute pericarditis. Inflammation develops suddenly and causes pericardial effusion.  Chronic pericarditis. Inflammation may develop slowly, or it may continue after acute pericarditis and last longer than 6 months.  Constrictive pericarditis (rare). The layers of the pericardium stiffen and develop scar tissue. The scar tissue thickens and sticks together. This makes it difficult for the heart to work in a normal way. In most cases, pericarditis is acute and not serious. Chronic pericarditis and constrictive pericarditis are more serious and may require treatment. What are the causes? In most cases, the cause of this condition is not known. If a cause is found, it may be:  An infection from a virus.  A heart attack (myocardial infarction).  Problems after open-heart surgery.  Chest injury.  Autoimmune disorder. The body's defense system (immune system) attacks healthy tissues. Examples include lupus or rheumatoid arthritis.  Kidney failure.  Underactive thyroid gland (hypothyroidism).  Cancer that has spread (metastasized) to the pericardium from another part of the body.  Treatment using high-energy X-rays (radiation).  Certain medicines, including some seizure medicines, blood thinners, heart medicines, and antibiotics.  Infection from a fungus or bacteria (rare). What increases the risk? The following factors may make you more likely to develop this condition:  Being female.  Being 20-50 years old.  Having had pericarditis  before.  Having had a recent upper respiratory tract infection. What are the signs or symptoms? The main symptom of this condition is chest pain. The chest pain may:  Be in the center or the left side of your chest.  Not go away with rest.  Last for many hours or days.  Worsen when you lie down and get better when you sit up and lean forward.  Worsen when you swallow.  Move to your back, neck, or shoulder. Other symptoms may include:  A chronic, dry cough.  Heart palpitations. These may feel like rapid, fluttering, or pounding heartbeats.  Dizziness or fainting.  Tiredness or fatigue.  Fever.  Rapid breathing.  Shortness of breath when lying down. How is this diagnosed? This condition is diagnosed based on medical history, physical exam, and diagnostic tests. During your physical exam, your health care provider will listen for friction while your heart beats (pericardial rub). You may also have tests, including:  Blood tests to look for signs of infection and inflammation.  Electrocardiogram (ECG). This test measures the electrical activity in your heart.  Echocardiogram. This uses sound waves to make images of your heart.  CT scan.  MRI.  Culture of pericardial fluid.  Removing a tissue sample of the pericardium to look at under a microscope (biopsy). If tests show that you may have constrictive pericarditis, a small, thin tube may be inserted into your heart to confirm the diagnosis (cardiac catheterization). How is this treated? Treatment for this condition depends on the cause and type of pericarditis that you have. In most cases, acute pericarditis will clear up on its own. Treatment may include:  Limiting physical activity.  Medicines, such as: ? NSAIDs to relieve pain and inflammation. ? Steroids to reduce inflammation. ? Colchicine to relieve pain   and inflammation.  A procedure to remove fluid using a needle (pericardiocentesis) if fluid buildup puts  pressure on the heart.  Surgery to remove part of the pericardium if constrictive pericarditis develops. If another condition is causing your pericarditis, you may also need treatment for that condition. Follow these instructions at home:  Limit your activity as told by your health care provider until your symptoms improve. This usually includes: ? Resting or sitting for most of the day. ? No long walks. ? No exercise. ? No sports.  Athletes may need to limit competition for several months.  Do not use any products that contain nicotine or tobacco, such as cigarettes and e-cigarettes. If you need help quitting, ask your health care provider.  Eat a heart-healthy diet. Ask your dietitian what foods are healthy for your heart.  Take over-the-counter and prescription medicines only as told by your health care provider. ? If you were prescribed an antibiotic medicine, take it as told by your health care provider. Do not stop taking the antibiotic even if you start to feel better.  Keep all follow-up visits as told by your health care provider. This is important. Contact a health care provider if:  You continue to have symptoms of pericarditis.  You develop new symptoms of pericarditis.  Your symptoms get worse.  You have a fever. Get help right away if:  You have worsening chest pain.  You have difficulty breathing.  You pass out. These symptoms may represent a serious problem that is an emergency. Do not wait to see if the symptoms will go away. Get medical help right away. Call your local emergency services (911 in the [place] Do not drive yourself to the hospital. Summary  Pericarditis is inflammation of the pericardium.  The pericardium is a thin, double-layered, fluid-filled sac that surrounds your heart.  The main symptom of this condition is chest pain.  Treatment for this condition depends on the cause and type of pericarditis that you have. This information is not  intended to replace advice given to you by your health care provider. Make sure you discuss any questions you have with your health care provider. Document Released: 06/06/2001 Document Revised: 01/17/2018 Document Reviewed: 01/17/2018 Elsevier Interactive Patient Education  2019 [employer].

## 2019-01-09 ENCOUNTER — Encounter: Payer: Self-pay | Admitting: Family Medicine

## 2019-01-09 ENCOUNTER — Ambulatory Visit: Payer: BLUE CROSS/BLUE SHIELD | Admitting: Family Medicine

## 2019-01-09 VITALS — BP 140/80 | HR 54 | Temp 98.1°F | Resp 16 | Wt 119.8 lb

## 2019-01-09 DIAGNOSIS — I309 Acute pericarditis, unspecified: Secondary | ICD-10-CM | POA: Diagnosis not present

## 2019-01-09 DIAGNOSIS — Z09 Encounter for follow-up examination after completed treatment for conditions other than malignant neoplasm: Secondary | ICD-10-CM | POA: Diagnosis not present

## 2019-01-09 DIAGNOSIS — F17200 Nicotine dependence, unspecified, uncomplicated: Secondary | ICD-10-CM

## 2019-01-09 MED ORDER — COLCHICINE 0.6 MG PO CAPS: 0.6000 mg | ORAL_CAPSULE | Freq: Every day | ORAL | 2 refills | Status: DC

## 2019-01-09 NOTE — Progress Notes (Signed)
Subjective:    Patient ID: Amanda Rich, female    DOB: 24-Sep-1966, 53 y.o.   MRN: [id number]  HPI Chief Complaint  Patient presents with  . hospital follow-up    hospital on heart issue   She is here for a hospital discharge follow up. Presented to the ED on 12/26/18 with pleuritic chest pain that was initially thought to be a STEMI due to ST elevations but then diagnosed with acute pericarditis after further work up. She was discharged on 12/28/2018 on colchicine and ibuprofen.  States she is taking ibuprofen 800 mg three times daily. States she was not able to get the colchicine from her pharmacy due to a lack of "authorization". States it was going to cost $190 and not affordable.   Reports feeling much better. Complains of a mild "soreness" to her chest wall with certain positions.  Denies fever, chills, dizziness, chest pain, palpitations, cough, shortness of breath, orthopnea, abdominal pain, N/V/D.   Has an appointment on 01/28/2019 with cardiology.   States she went back to work last week and is doing fine, able to perform her tasks.   Complains of feeling slightly depressed about her dog dying last week. States she plans to rescue another dog soon. Declines counseling.   Living with her boyfriend and is close to her sister. Good support network.   Smoking and does not have a plan to stop. States she knows she should.     Past Medical History:  Diagnosis Date  . Anemia   . Depression   . Smoker    Past Surgical History:  Procedure Laterality Date  . ABLATION ON ENDOMETRIOSIS    . GANGLION CYST EXCISION  2007   L wrist; [facility], [place]  . TUBAL LIGATION     [facility]  . UMBILICAL HERNIA REPAIR  2005   [facility]   Current Outpatient Medications on File Prior to Visit  Medication Sig Dispense Refill  . Biotin 5000 MCG CAPS Take 1 capsule by mouth daily.     . Cholecalciferol (VITAMIN D3) 2000 units TABS Take 1 tablet by mouth daily.     . citalopram (CELEXA) 20 MG tablet  Take 1 tablet (20 mg total) by mouth daily. (Patient taking differently: Take 10 mg by mouth daily. ) 30 tablet 2  . ibuprofen (ADVIL,MOTRIN) 800 MG tablet Take 1 tablet (800 mg total) by mouth 3 (three) times daily for 14 days. 42 tablet 0  . Multiple Vitamin (MULTIVITAMIN WITH MINERALS) TABS Take 1 tablet by mouth daily.    . Prenatal Vit-DSS-Fe Cbn-FA (PRENATAL ADVANTAGE PO) Take by mouth.    . TURMERIC PO Take 1 tablet by mouth daily.      No current facility-administered medications on file prior to visit.     Reviewed allergies, medications, past medical, surgical, family, and social history.   Review of Systems Pertinent positives and negatives in the history of present illness.     Objective:   Physical Exam Constitutional:      General: She is not in acute distress.    Appearance: Normal appearance.  HENT:     Right Ear: Tympanic membrane normal.     Left Ear: Tympanic membrane normal.     Nose: Nose normal.     Mouth/Throat:     Mouth: Mucous membranes are moist.     Pharynx: Oropharynx is clear.  Eyes:     Conjunctiva/sclera: Conjunctivae normal.     Pupils: Pupils are equal, round, and reactive to  light.  Neck:     Musculoskeletal: Normal range of motion and neck supple.  Cardiovascular:     Rate and Rhythm: Normal rate and regular rhythm.     Pulses: Normal pulses.     Heart sounds: Normal heart sounds. No friction rub.  Pulmonary:     Effort: Pulmonary effort is normal.     Breath sounds: Normal breath sounds.  Chest:     Chest wall: Tenderness present.     Comments: Mild diffuse TTP to chest wall. Musculoskeletal:     Right lower leg: No edema.     Left lower leg: No edema.  Lymphadenopathy:     Cervical: No cervical adenopathy.  Skin:    General: Skin is warm and dry.     Capillary Refill: Capillary refill takes less than 2 seconds.  Neurological:     General: No focal deficit present.     Mental Status: She is alert and oriented to person, place,  and time.     Cranial Nerves: Cranial nerves are intact.     Sensory: Sensation is intact.     Motor: Motor function is intact.  Psychiatric:        Attention and Perception: Attention normal.        Speech: Speech normal.        Behavior: Behavior normal.        Thought Content: Thought content normal.     Comments: Slightly depressed mood when discussing her dog's recent death but otherwise normal mood    BP 140/80   Pulse (!) 54   Temp 98.1 F (36.7 C)   Resp 16   Wt 119 lb 12.8 oz (54.3 kg)   SpO2 98%   BMI 19.94 kg/m       Assessment & Plan:  Acute pericarditis, unspecified type  Smoker  Follow up  Reviewed notes, results and discharge summary from her hospitalization. Medications reviewed.  Discussed acute pericarditis and treatment.  She appears to be significantly improved and almost back to her baseline.   She has not had colchicine due to a mixup with the pharmacy and her insurance company.   We called her pharmacy and was advised that if we changed the order to [place] that they would be able to fill this for her.  This was taken care of and she will now start the once daily colchicine as recommended.  She will continue on 800 mg of ibuprofen 3 times daily. Strongly encouraged her to stop smoking and discussed various methods to help her do this.  She is not ready. She will let me know if her depression worsens or if she decides that she would actually like to pursue counseling. Follow-up with cardiology as scheduled.

## 2019-01-09 NOTE — Patient Instructions (Signed)
Continue on Ibuprofen and colchicine as recommended and follow up with your cardiologist on 01/28/2019.   I strongly encourage you to stop smoking. Let me know if I can assist you with this.    Steps to Quit Smoking  Smoking tobacco can be harmful to your health and can affect almost every organ in your body. Smoking puts you, and those around you, at risk for developing many serious chronic diseases. Quitting smoking is difficult, but it is one of the best things that you can do for your health. It is never too late to quit. What are the benefits of quitting smoking? When you quit smoking, you lower your risk of developing serious diseases and conditions, such as:  Lung cancer or lung disease, such as COPD.  Heart disease.  Stroke.  Heart attack.  Infertility.  Osteoporosis and bone fractures. Additionally, symptoms such as coughing, wheezing, and shortness of breath may get better when you quit. You may also find that you get sick less often because your body is stronger at fighting off colds and infections. If you are pregnant, quitting smoking can help to reduce your chances of having a baby of low birth weight. How do I get ready to quit? When you decide to quit smoking, create a plan to make sure that you are successful. Before you quit:  Pick a date to quit. Set a date within the next two weeks to give you time to prepare.  Write down the reasons why you are quitting. Keep this list in places where you will see it often, such as on your bathroom mirror or in your car or wallet.  Identify the people, places, things, and activities that make you want to smoke (triggers) and avoid them. Make sure to take these actions: ? Throw away all cigarettes at home, at work, and in your car. ? Throw away smoking accessories, such as Scientist, research (medical). ? Clean your car and make sure to empty the ashtray. ? Clean your home, including curtains and carpets.  Tell your family, friends,  and coworkers that you are quitting. Support from your loved ones can make quitting easier.  Talk with your health care provider about your options for quitting smoking.  Find out what treatment options are covered by your health insurance. What strategies can I use to quit smoking? Talk with your healthcare provider about different strategies to quit smoking. Some strategies include:  Quitting smoking altogether instead of gradually lessening how much you smoke over a period of time. Research shows that quitting "cold [place]" is more successful than gradually quitting.  Attending in-person counseling to help you build problem-solving skills. You are more likely to have success in quitting if you attend several counseling sessions. Even short sessions of 10 minutes can be effective.  Finding resources and support systems that can help you to quit smoking and remain smoke-free after you quit. These resources are most helpful when you use them often. They can include: ? Online chats with a Social worker. ? Telephone quitlines. ? Careers information officer. ? Support groups or group counseling. ? Text messaging programs. ? Mobile phone applications.  Taking medicines to help you quit smoking. (If you are pregnant or breastfeeding, talk with your health care provider first.) Some medicines contain nicotine and some do not. Both types of medicines help with cravings, but the medicines that include nicotine help to relieve withdrawal symptoms. Your health care provider may recommend: ? Nicotine patches, gum, or lozenges. ? Nicotine inhalers  or sprays. ? Non-nicotine medicine that is taken by mouth. Talk with your health care provider about combining strategies, such as taking medicines while you are also receiving in-person counseling. Using these two strategies together makes you more likely to succeed in quitting than if you used either strategy on its own. If you are pregnant or breastfeeding,  talk with your health care provider about finding counseling or other support strategies to quit smoking. Do not take medicine to help you quit smoking unless told to do so by your health care provider. What things can I do to make it easier to quit? Quitting smoking might feel overwhelming at first, but there is a lot that you can do to make it easier. Take these important actions:  Reach out to your family and friends and ask that they support and encourage you during this time. Call telephone quitlines, reach out to support groups, or work with a counselor for support.  Ask people who smoke to avoid smoking around you.  Avoid places that trigger you to smoke, such as bars, parties, or smoke-break areas at work.  Spend time around people who do not smoke.  Lessen stress in your life, because stress can be a smoking trigger for some people. To lessen stress, try: ? Exercising regularly. ? Deep-breathing exercises. ? Yoga. ? Meditating. ? Performing a body scan. This involves closing your eyes, scanning your body from head to toe, and noticing which parts of your body are particularly tense. Purposefully relax the muscles in those areas.  Download or purchase mobile phone or tablet apps (applications) that can help you stick to your quit plan by providing reminders, tips, and encouragement. There are many free apps, such as QuitGuide from the [employer] Office manager for Disease Control and Prevention). You can find other support for quitting smoking (smoking cessation) through smokefree.gov and other websites. How will I feel when I quit smoking? Within the first 24 hours of quitting smoking, you may start to feel some withdrawal symptoms. These symptoms are usually most noticeable 2-3 days after quitting, but they usually do not last beyond 2-3 weeks. Changes or symptoms that you might experience include:  Mood swings.  Restlessness, anxiety, or irritation.  Difficulty  concentrating.  Dizziness.  Strong cravings for sugary foods in addition to nicotine.  Mild weight gain.  Constipation.  Nausea.  Coughing or a sore throat.  Changes in how your medicines work in your body.  A depressed mood.  Difficulty sleeping (insomnia). After the first 2-3 weeks of quitting, you may start to notice more positive results, such as:  Improved sense of smell and taste.  Decreased coughing and sore throat.  Slower heart rate.  Lower blood pressure.  Clearer skin.  The ability to breathe more easily.  Fewer sick days. Quitting smoking is very challenging for most people. Do not get discouraged if you are not successful the first time. Some people need to make many attempts to quit before they achieve long-term success. Do your best to stick to your quit plan, and talk with your health care provider if you have any questions or concerns. This information is not intended to replace advice given to you by your health care provider. Make sure you discuss any questions you have with your health care provider. Document Released: 12/05/2001 Document Revised: 07/17/2017 Document Reviewed: 04/27/2015 Elsevier Interactive Patient Education  2019 [employer].

## 2019-01-22 DIAGNOSIS — I319 Disease of pericardium, unspecified: Secondary | ICD-10-CM | POA: Insufficient documentation

## 2019-01-22 NOTE — Progress Notes (Deleted)
Cardiology Office Note    Date:  01/22/2019   ID:  STEVIE ERTLE, DOB [birth year], MRN [id number]  PCP:  Girtha Rm, NP-C  Cardiologist: Fransico Him, MD EPS: None  No chief complaint on file.   History of Present Illness:  Amanda Rich is a 53 y.o. female discharged from the hospital 12/27/2017 after admission with chest pain felt to be secondary to pericarditis felt secondary to a recent upper respiratory infection.  She had diffuse ST elevation with PR depression.  No pericardial effusion.  No coronary artery calcification.  ESR 7.2.  Cardiac MRI showed leg gadolinium enhancement of pericardium consistent with acute pericarditis.  Recommend ibuprofen for 2 weeks and colchicine for 3 months.  Aspirin was stopped.    Past Medical History:  Diagnosis Date  . Anemia   . Depression   . Smoker     Past Surgical History:  Procedure Laterality Date  . ABLATION ON ENDOMETRIOSIS    . GANGLION CYST EXCISION  2007   L wrist; [facility], [place]  . TUBAL LIGATION     [facility]  . UMBILICAL HERNIA REPAIR  2005   [facility]    Current Medications: No outpatient medications have been marked as taking for the 01/28/19 encounter (Appointment) with Imogene Burn, PA-C.     Allergies:   Clarithromycin   Social History   Socioeconomic History  . Marital status: Legally Separated    Spouse name: Not on file  . Number of children: Not on file  . Years of education: Not on file  . Highest education level: Not on file  Occupational History  . Not on file  Social Needs  . Financial resource strain: Not on file  . Food insecurity:    Worry: Not on file    Inability: Not on file  . Transportation needs:    Medical: Not on file    Non-medical: Not on file  Tobacco Use  . Smoking status: Current Some Day Smoker    Packs/day: 1.00    Years: 25.00    Pack years: 25.00    Types: Cigarettes  . Smokeless tobacco: Never Used  . Tobacco comment: smoking cigarettes again and not e-cigs    Substance and Sexual Activity  . Alcohol use: Yes    Alcohol/week: 1.0 standard drinks    Types: 1 Glasses of wine per week  . Drug use: No  . Sexual activity: Not on file  Lifestyle  . Physical activity:    Days per week: Not on file    Minutes per session: Not on file  . Stress: Not on file  Relationships  . Social connections:    Talks on phone: Not on file    Gets together: Not on file    Attends religious service: Not on file    Active member of club or organization: Not on file    Attends meetings of clubs or organizations: Not on file    Relationship status: Not on file  Other Topics Concern  . Not on file  Social History Narrative  . Not on file     Family History:  The patient's ***family history includes Colon cancer in her cousin and maternal uncle; Diabetes in her maternal aunt and mother; Hypertension in her mother; Liver cancer in her daughter.   ROS:   Please see the history of present illness.    ROS All other systems reviewed and are negative.   PHYSICAL EXAM:   VS:  There  were no vitals taken for this visit.  Physical Exam  GEN: Well nourished, well developed, in no acute distress  HEENT: normal  Neck: no JVD, carotid bruits, or masses Cardiac:RRR; no murmurs, rubs, or gallops  Respiratory:  clear to auscultation bilaterally, normal work of breathing GI: soft, nontender, nondistended, + BS Ext: without cyanosis, clubbing, or edema, Good distal pulses bilaterally MS: no deformity or atrophy  Skin: warm and dry, no rash Neuro:  Alert and Oriented x 3, Strength and sensation are intact Psych: euthymic mood, full affect  Wt Readings from Last 3 Encounters:  01/09/19 119 lb 12.8 oz (54.3 kg)  12/28/18 112 lb 12.8 oz (51.2 kg)  09/16/18 114 lb 12.8 oz (52.1 kg)      Studies/Labs Reviewed:   EKG:  EKG is*** ordered today.  The ekg ordered today demonstrates ***  Recent Labs: 09/14/2018: ALT 14 12/26/2018: B Natriuretic Peptide 44.2; BUN 9;  Creatinine, Ser 0.63; Hemoglobin 11.4; Platelets 162; Potassium 4.1; Sodium 139; TSH 0.600   Lipid Panel    Component Value Date/Time   CHOL 116 12/26/2018 0646   TRIG 13 12/26/2018 0646   HDL 53 12/26/2018 0646   CHOLHDL 2.2 12/26/2018 0646   VLDL 3 12/26/2018 0646   LDLCALC 60 12/26/2018 0646   LDLCALC 73 09/24/2017 0827    Additional studies/ records that were reviewed today include:  Mr Cardiac Morphology W Wo Contrast   Result Date: 12/27/2018 CLINICAL DATA:  Pericarditis EXAM: CARDIAC MRI TECHNIQUE: The patient was scanned on a 1.5 Tesla GE magnet. A dedicated cardiac coil was used. Functional imaging was done using Fiesta sequences. 2,3, and 4 chamber views were done to assess for RWMA's. Modified Simpson's rule using a short axis stack was used to calculate an ejection fraction on a dedicated work Conservation officer, nature. The patient received 8 cc of Gadavist. After 10 minutes inversion recovery sequences were used to assess for infiltration and scar tissue. FINDINGS: Limited images of the lung fields show no gross abnormalities. No significant pericardial effusion. Normal left ventricular size and wall motion, EF 64%. Normal wall thickness. Normal right ventricular size and systolic function, EF 84%. Normal left and right atrial sizes. Trileaflet aortic valve with no significant stenosis or regurgitation. No significant mitral regurgitation noted. Mild tricuspid regurgitation. On delayed enhancement imaging, there is diffuse late gadolinium enhancement (LGE) of the pericardium. No myocardial LGE. Measurements: LVEDV 121 mL LVSV 77 mL LVEF 64% RVEDV 126 mL RVSV 72 mL RVEF 57% IMPRESSION: 1.  Normal LV size and systolic function, EF 13%. 2.  Normal RV size and systolic function, EF 24%. 3. No significant pericardial effusion. However, there is diffuse LGE involving the pericardium. This is suggestive of acute pericarditis. Dalton Mclean Electronically Signed   By: Loralie Champagne M.D.    On: 12/27/2018 17:29         ASSESSMENT:    1. Mixed hyperlipidemia   2. Viral pericarditis, unspecified chronicity      PLAN:  In order of problems listed above:  Pericarditis felt secondary to recent URI.  Colchicine for 3 months ibuprofen for 2 weeks  Mixed hyperlipidemia  Medication Adjustments/Labs and Tests Ordered: Current medicines are reviewed at length with the patient today.  Concerns regarding medicines are outlined above.  Medication changes, Labs and Tests ordered today are listed in the Patient Instructions below. There are no Patient Instructions on file for this visit.   Sumner Boast, PA-C  01/22/2019 2:51 PM  [facility] [place], [place], [place]  [address] Phone: [phone]; Fax: [phone]

## 2019-01-28 ENCOUNTER — Ambulatory Visit: Payer: BLUE CROSS/BLUE SHIELD | Admitting: Physician Assistant

## 2019-01-30 ENCOUNTER — Encounter: Payer: Self-pay | Admitting: Medical

## 2019-01-30 ENCOUNTER — Ambulatory Visit: Payer: BLUE CROSS/BLUE SHIELD | Admitting: Medical

## 2019-01-30 VITALS — BP 110/70 | HR 103 | Temp 98.5°F | Resp 16 | Ht 65.0 in | Wt 115.8 lb

## 2019-01-30 DIAGNOSIS — R6889 Other general symptoms and signs: Secondary | ICD-10-CM

## 2019-01-30 DIAGNOSIS — F17200 Nicotine dependence, unspecified, uncomplicated: Secondary | ICD-10-CM | POA: Diagnosis not present

## 2019-01-30 DIAGNOSIS — J029 Acute pharyngitis, unspecified: Secondary | ICD-10-CM | POA: Diagnosis not present

## 2019-01-30 DIAGNOSIS — R9389 Abnormal findings on diagnostic imaging of other specified body structures: Secondary | ICD-10-CM | POA: Diagnosis not present

## 2019-01-30 DIAGNOSIS — Z8679 Personal history of other diseases of the circulatory system: Secondary | ICD-10-CM

## 2019-01-30 LAB — POCT RAPID STREP A (OFFICE): Rapid Strep A Screen: NEGATIVE

## 2019-01-30 LAB — POCT INFLUENZA A/B
Influenza A, POC: NEGATIVE
Influenza B, POC: NEGATIVE

## 2019-01-30 MED ORDER — OSELTAMIVIR PHOSPHATE 75 MG PO CAPS: 75.0000 mg | ORAL_CAPSULE | Freq: Two times a day (BID) | ORAL | 0 refills | Status: DC

## 2019-01-30 MED ORDER — ALBUTEROL SULFATE HFA 108 (90 BASE) MCG/ACT IN AERS: 2.0000 | INHALATION_SPRAY | Freq: Four times a day (QID) | RESPIRATORY_TRACT | 0 refills | Status: DC | PRN

## 2019-01-30 NOTE — Patient Instructions (Signed)
Thank you for giving me the opportunity to serve you today.   Your diagnosis today includes: Encounter Diagnoses  Name Primary?  . Flu-like symptoms Yes  . Sore throat     Medications prescribed today:  Use Ibuprofen 200mg  over the counter, 3 tablets twice daily for body aches, fever, chills  Begin Tamiflu twice daily for 5 days  You can still use the Theraflu  REST and HYDRATE!!!!!!!!!11  Use the albuterol inhaler, 1 or 2 puffs 3 times daily the next few days  If worse or not improving call back   Please call or return if worse or not improving in the next few days.    Medication costs:  If you get to the pharmacy and medication prescribed today was either too expensive, not covered by your insurance, or required prior authorization, then please call us back to let us know.  We often have no way to know if a medication is too expensive or not covered by your insurance.  Thanks for your cooperation.   No follow-ups on file.    I have included other useful information below for your review.   Influenza, Adult Influenza ("the flu") is a viral infection of the respiratory tract. It causes chills, fever, cough, headache, body aches, and sore throat. Influenza in general will make you feel sicker than when you have a common cold. Symptoms of the illness typically last a few days. Cough and fatigue may continue for as long as 7 to 10 days. Influenza is highly contagious. It spreads easily to others in the droplets from coughs and sneezes. People frequently become infected by touching something that was recently contaminated with the virus and then touch their mouth, nose or eyes. This infection is caused by a virus. Symptoms will not be reduced or improved by taking an antibiotic. Antibiotics are medications that kill bacteria, not viruses. DIAGNOSIS  Diagnosis of influenza is often made based on the history and physical examination as well as the presence of influenza reports  occurring in your community. Testing can be done if the diagnosis is not certain. TREATMENT  Since influenza is caused by a virus, antibiotics are not helpful. Your caregiver may prescribe antiviral medicines to shorten the illness and lessen the severity. Your caregiver may also recommend influenza vaccination and/or antiviral medicines for your family members in order to prevent the spread of influenza to them. HOME CARE INSTRUCTIONS  DO NOT GIVE ASPIRIN TO PERSONS WITH INFLUENZA WHO ARE UNDER 75 YEARS OF AGE. This could lead to brain and liver damage (Reye's syndrome). Read the label on over-the-counter medicines.   Stay home from work or school if at all possible until most of your symptoms are gone.   Only take over-the-counter or prescription medicines for pain, discomfort, or fever as directed by your caregiver.   Use a cool mist humidifier to increase air moisture. This will make breathing easier.   Rest until your temperature is nearly normal: 98.6 F (37 C). This usually takes 3 to 4 days. Be sure you get plenty of rest.   Drink at least eight, eight-ounce glasses of fluids per day. Fluids include water, juice, broth, gelatin, or lemonade.   Cover your mouth and nose when coughing or sneezing and wash your hands often to prevent the spread of this virus to other persons.  PREVENTION  Annual influenza vaccination (flu shots) is the best way to avoid getting influenza. An annual flu shot is now routinely recommended for all adults in  the [place] IF:   You develop shortness of breath while resting.   You have a deep cough with production of mucous or chest pain.   You develop nausea (feeling sick to your stomach), vomiting, or diarrhea.  SEEK IMMEDIATE MEDICAL CARE IF:   You have difficulty breathing, become short of breath, or your skin or nails turn bluish.   You develop severe neck pain or stiffness.   You develop a severe headache, facial pain, or earache.    You have a fever.   You develop nausea or vomiting that cannot be controlled.  Document Released: 12/08/2000 Document Revised: 08/23/2011 Document Reviewed: 10/13/2009 [facility] Patient Information 2012 [place].

## 2019-01-30 NOTE — Progress Notes (Signed)
Subjective: Chief Complaint  Patient presents with  . sore throat    sore throat, ear ache, cough, fever X 1 day   Here for 1 day hx/o cough, sore throat, fever, ears hurt, not feeling well.   Has used some theraflu.    No chills, but does have some soreness in chest from cough.    No NVD.   She does note earlier in the week had a 24 hours stomach virus that resolved.    No rash.   Has some headache behind eyes.    Fever subjective.   No sick contracts with flu or strep, but has grand kids in daycare she has been around.  No hx/o asthma or lung disease.  No SOB, no wheezing.  No other aggravating or relieving factors. No other complaint.  Past Medical History:  Diagnosis Date  . Anemia   . Depression   . Smoker    Current Outpatient Medications on File Prior to Visit  Medication Sig Dispense Refill  . Biotin 5000 MCG CAPS Take 1 capsule by mouth daily.     . Cholecalciferol (VITAMIN D3) 2000 units TABS Take 1 tablet by mouth daily.     . citalopram (CELEXA) 20 MG tablet Take 1 tablet (20 mg total) by mouth daily. (Patient taking differently: Take 10 mg by mouth daily. ) 30 tablet 2  . Colchicine (MITIGARE) 0.6 MG CAPS Take 0.6 mg by mouth daily. 30 capsule 2  . Multiple Vitamin (MULTIVITAMIN WITH MINERALS) TABS Take 1 tablet by mouth daily.    . Prenatal Vit-DSS-Fe Cbn-FA (PRENATAL ADVANTAGE PO) Take by mouth.    . TURMERIC PO Take 1 tablet by mouth daily.      No current facility-administered medications on file prior to visit.    ROS as in subjective    Objective: BP 110/70   Pulse (!) 103   Temp 98.5 F (36.9 C) (Oral)   Resp 16   Ht 5\' 5"  (1.651 m)   Wt 115 lb 12.8 oz (52.5 kg)   SpO2 95%   BMI 19.27 kg/m    General appearance: alert, no distress, WD/WN, lean AA female HEENT: normocephalic, sclerae anicteric, TMs flat, but ears tender in general bilat, nares patent, no discharge,+ erythema, pharynx normal Oral cavity: MMM, no lesions Neck: supple, shoddy tender  anterior nodes, no thyromegaly, no masses Heart: RRR, normal S1, S2, no murmurs Lungs: decrease breath sounds in general, no wheezes, rhonchi, or rales Ext: no edema Pulses: 2+ symmetric, upper and lower extremities, normal cap refill    Assessment: Encounter Diagnoses  Name Primary?  . Flu-like symptoms Yes  . Sore throat   . Abnormal chest CT   . Smoker   . History of pericarditis     Plan: Symptoms seem most consistent with influenza although flu swab was negative.  I suspect false negative given the early onset of symptoms within the past 8 hours, but clinically suggestive of flu  Prescription given for Tamiflu, discussed risks/benefits of medication.    Discussed diagnosis of influenza. Discussed supportive care including rest, hydration, OTC Tylenol or NSAID for fever, aches, and malaise.  Discussed period of contagion, self quarantine at home away from others to avoid spread of disease, discussed means of transmission, and possible complications including pneumonia.  If worse or not improving within the next 4-5 days, then call or return.  Reviewing her chart, she was just diagnosed and treated for pericarditis in January, hospitalization.  She also had a CT  suggesting signs of emphysema.  We discussed this, her long smoking history greater than 30 years.  I advise she return in a few weeks when she has better to do a pulmonary function test with her primary care provider here.  Her history and CT findings suggest emphysema/COPD.    Patient voiced understanding of diagnosis, recommendations, and treatment plan.  After visit summary given.  Gave note for work.  Kallie was seen today for sore throat.  Diagnoses and all orders for this visit:  Flu-like symptoms -     Influenza A/B  Sore throat -     Rapid Strep A  Abnormal chest CT  Smoker  History of pericarditis  Other orders -     albuterol (PROVENTIL HFA;VENTOLIN HFA) 108 (90 Base) MCG/ACT inhaler; Inhale 2  puffs into the lungs every 6 (six) hours as needed for wheezing or shortness of breath. -     oseltamivir (TAMIFLU) 75 MG capsule; Take 1 capsule (75 mg total) by mouth 2 (two) times daily.

## 2019-02-18 NOTE — Progress Notes (Deleted)
Cardiology Office Note    Date:  02/18/2019   ID:  Amanda Rich, Amanda Rich 30-Jun-1966, MRN [id number]  PCP:  Girtha Rm, NP-C  Cardiologist: Fransico Him, MD EPS: None  No chief complaint on file.   History of Present Illness:  Amanda Rich is a 53 y.o. female was discharged from the hospital 12/27/2017 after admission with chest pain felt to be secondary to pericarditis felt secondary to a recent upper respiratory infection.  She had diffuse ST elevation with PR depression.  No pericardial effusion.  No coronary artery calcification.  ESR 7.2.  Cardiac MRI showed leg gadolinium enhancement of pericardium consistent with acute pericarditis.  Recommend ibuprofen for 2 weeks and colchicine for 3 months.  Aspirin was stopped.       Past Medical History:  Diagnosis Date  . Anemia   . Depression   . Smoker     Past Surgical History:  Procedure Laterality Date  . ABLATION ON ENDOMETRIOSIS    . GANGLION CYST EXCISION  2007   L wrist; [facility], [place]  . TUBAL LIGATION     [facility]  . UMBILICAL HERNIA REPAIR  2005   [facility]    Current Medications: No outpatient medications have been marked as taking for the 02/25/19 encounter (Appointment) with Imogene Burn, PA-C.     Allergies:   Clarithromycin   Social History   Socioeconomic History  . Marital status: Legally Separated    Spouse name: Not on file  . Number of children: Not on file  . Years of education: Not on file  . Highest education level: Not on file  Occupational History  . Not on file  Social Needs  . Financial resource strain: Not on file  . Food insecurity:    Worry: Not on file    Inability: Not on file  . Transportation needs:    Medical: Not on file    Non-medical: Not on file  Tobacco Use  . Smoking status: Current Some Day Smoker    Packs/day: 1.00    Years: 25.00    Pack years: 25.00    Types: Cigarettes  . Smokeless tobacco: Never Used  . Tobacco comment: smoking cigarettes again and not  e-cigs  Substance and Sexual Activity  . Alcohol use: Yes    Alcohol/week: 1.0 standard drinks    Types: 1 Glasses of wine per week  . Drug use: No  . Sexual activity: Not on file  Lifestyle  . Physical activity:    Days per week: Not on file    Minutes per session: Not on file  . Stress: Not on file  Relationships  . Social connections:    Talks on phone: Not on file    Gets together: Not on file    Attends religious service: Not on file    Active member of club or organization: Not on file    Attends meetings of clubs or organizations: Not on file    Relationship status: Not on file  Other Topics Concern  . Not on file  Social History Narrative  . Not on file     Family History:  The patient's ***family history includes Colon cancer in her cousin and maternal uncle; Diabetes in her maternal aunt and mother; Hypertension in her mother; Liver cancer in her daughter.   ROS:   Please see the history of present illness.    ROS All other systems reviewed and are negative.   PHYSICAL EXAM:  VS:  There were no vitals taken for this visit.  Physical Exam  GEN: Well nourished, well developed, in no acute distress  HEENT: normal  Neck: no JVD, carotid bruits, or masses Cardiac:RRR; no murmurs, rubs, or gallops  Respiratory:  clear to auscultation bilaterally, normal work of breathing GI: soft, nontender, nondistended, + BS Ext: without cyanosis, clubbing, or edema, Good distal pulses bilaterally MS: no deformity or atrophy  Skin: warm and dry, no rash Neuro:  Alert and Oriented x 3, Strength and sensation are intact Psych: euthymic mood, full affect  Wt Readings from Last 3 Encounters:  01/30/19 115 lb 12.8 oz (52.5 kg)  01/09/19 119 lb 12.8 oz (54.3 kg)  12/28/18 112 lb 12.8 oz (51.2 kg)      Studies/Labs Reviewed:   EKG:  EKG is*** ordered today.  The ekg ordered today demonstrates ***  Recent Labs: 09/14/2018: ALT 14 12/26/2018: B Natriuretic Peptide 44.2; BUN 9;  Creatinine, Ser 0.63; Hemoglobin 11.4; Platelets 162; Potassium 4.1; Sodium 139; TSH 0.600   Lipid Panel    Component Value Date/Time   CHOL 116 12/26/2018 0646   TRIG 13 12/26/2018 0646   HDL 53 12/26/2018 0646   CHOLHDL 2.2 12/26/2018 0646   VLDL 3 12/26/2018 0646   LDLCALC 60 12/26/2018 0646   LDLCALC 73 09/24/2017 0827    Additional studies/ records that were reviewed today include:  Mr Cardiac Morphology W Wo Contrast   Result Date: 12/27/2018 CLINICAL DATA:  Pericarditis EXAM: CARDIAC MRI TECHNIQUE: The patient was scanned on a 1.5 Tesla GE magnet. A dedicated cardiac coil was used. Functional imaging was done using Fiesta sequences. 2,3, and 4 chamber views were done to assess for RWMA's. Modified Simpson's rule using a short axis stack was used to calculate an ejection fraction on a dedicated work Conservation officer, nature. The patient received 8 cc of Gadavist. After 10 minutes inversion recovery sequences were used to assess for infiltration and scar tissue. FINDINGS: Limited images of the lung fields show no gross abnormalities. No significant pericardial effusion. Normal left ventricular size and wall motion, EF 64%. Normal wall thickness. Normal right ventricular size and systolic function, EF 19%. Normal left and right atrial sizes. Trileaflet aortic valve with no significant stenosis or regurgitation. No significant mitral regurgitation noted. Mild tricuspid regurgitation. On delayed enhancement imaging, there is diffuse late gadolinium enhancement (LGE) of the pericardium. No myocardial LGE. Measurements: LVEDV 121 mL LVSV 77 mL LVEF 64% RVEDV 126 mL RVSV 72 mL RVEF 57% IMPRESSION: 1.  Normal LV size and systolic function, EF 41%. 2.  Normal RV size and systolic function, EF 74%. 3. No significant pericardial effusion. However, there is diffuse LGE involving the pericardium. This is suggestive of acute pericarditis. Dalton Mclean Electronically Signed   By: Loralie Champagne M.D.    On: 12/27/2018 17:29      Echocardiogram 12/27/18 Study Conclusions   - Left ventricle: The cavity size was normal. Systolic function was   normal. The estimated ejection fraction was in the range of 55%   to 60%. Wall motion was normal; there were no regional wall   motion abnormalities. Left ventricular diastolic function   parameters were normal. - Mitral valve: There was trivial regurgitation. - Tricuspid valve: There was trivial regurgitation. _____________   Cardiac MRI 12/27/2018 FINDINGS: Limited images of the lung fields show no gross abnormalities.   No significant pericardial effusion. Normal left ventricular size and wall motion, EF 64%. Normal wall thickness. Normal right  ventricular size and systolic function, EF 55%. Normal left and right atrial sizes. Trileaflet aortic valve with no significant stenosis or regurgitation. No significant mitral regurgitation noted. Mild tricuspid regurgitation.   On delayed enhancement imaging, there is diffuse late gadolinium enhancement (LGE) of the pericardium. No myocardial LGE.   Measurements: LVEDV 121 mL LVSV 77 mL LVEF 64% RVEDV 126 mL RVSV 72 mL RVEF 57%   IMPRESSION: 1.  Normal LV size and systolic function, EF 97%. 2.  Normal RV size and systolic function, EF 41%. 3. No significant pericardial effusion. However, there is diffuse LGE involving the pericardium. This is suggestive of acute pericarditis.      ASSESSMENT:    No diagnosis found.   PLAN:  In order of problems listed above:  Pericarditis felt secondary to recent URI.  Colchicine for 3 months ibuprofen for 2 weeks  Mixed hyperlipidemia        Medication Adjustments/Labs and Tests Ordered: Current medicines are reviewed at length with the patient today.  Concerns regarding medicines are outlined above.  Medication changes, Labs and Tests ordered today are listed in the Patient Instructions below. There are no Patient Instructions on file  for this visit.   Sumner Boast, PA-C  02/18/2019 1:57 PM    [facility] [place], [place], [place]  [address] Phone: [phone]; Fax: [phone]

## 2019-02-25 ENCOUNTER — Ambulatory Visit: Payer: BLUE CROSS/BLUE SHIELD | Admitting: Physician Assistant

## 2019-02-26 ENCOUNTER — Encounter: Payer: Self-pay | Admitting: Physician Assistant

## 2019-03-19 ENCOUNTER — Telehealth: Payer: Self-pay | Admitting: Family Medicine

## 2019-03-19 NOTE — Telephone Encounter (Signed)
Pt called and states she is very concerned about working with her medical history. She is not experiencing any issues or symptoms. She was wondering how you felt about her going to work. Please call pt at [phone].

## 2019-03-19 NOTE — Telephone Encounter (Signed)
I really cannot take her out of work. I do recommend that she wash her hands often and try to avoid being close to anyone who is sick. Make sure she is eating a healthy diet, staying hydrated, getting regular sleep and taking a multi vitamin. Hopefully she has stopped smoking completely, this will help.

## 2019-03-20 NOTE — Telephone Encounter (Signed)
Pt was notified of results

## 2019-03-20 NOTE — Telephone Encounter (Signed)
Left message for pt to call me back 

## 2019-09-02 ENCOUNTER — Other Ambulatory Visit: Payer: Self-pay

## 2019-09-02 DIAGNOSIS — Z20822 Contact with and (suspected) exposure to covid-19: Secondary | ICD-10-CM

## 2019-09-04 LAB — NOVEL CORONAVIRUS, NAA: SARS-CoV-2, NAA: NOT DETECTED

## 2020-03-25 ENCOUNTER — Ambulatory Visit: Payer: Self-pay | Attending: Internal Medicine

## 2020-03-25 DIAGNOSIS — Z23 Encounter for immunization: Secondary | ICD-10-CM

## 2020-03-25 NOTE — Progress Notes (Signed)
   Covid-19 Vaccination Clinic  Name:  Amanda Rich    MRN: [id number] DOB: 10/02/1966  03/25/2020  Amanda Rich was observed post Covid-19 immunization for 15 minutes without incident. She was provided with Vaccine Information Sheet and instruction to access the V-Safe system.   Amanda Rich was instructed to call 911 with any severe reactions post vaccine: Marland Kitchen Difficulty breathing  . Swelling of face and throat  . A fast heartbeat  . A bad rash all over body  . Dizziness and weakness   Immunizations Administered    Name Date Dose VIS Date Route   Pfizer COVID-19 Vaccine 03/25/2020  1:20 PM 0.3 mL 12/05/2019 Intramuscular   Manufacturer: [place]   Lot: [id number]   [place]: [id number]

## 2020-04-19 ENCOUNTER — Ambulatory Visit: Payer: Self-pay | Attending: Internal Medicine

## 2020-04-19 DIAGNOSIS — Z23 Encounter for immunization: Secondary | ICD-10-CM

## 2020-04-19 NOTE — Progress Notes (Signed)
   Covid-19 Vaccination Clinic  Name:  Amanda Rich    MRN: [id number] DOB: Oct 01, 1966  04/19/2020  [name] was observed post Covid-19 immunization for 15 minutes without incident. She was provided with Vaccine Information Sheet and instruction to access the V-Safe system.   [name] was instructed to call 911 with any severe reactions post vaccine: Marland Kitchen Difficulty breathing  . Swelling of face and throat  . A fast heartbeat  . A bad rash all over body  . Dizziness and weakness   Immunizations Administered    Name Date Dose VIS Date Route   Pfizer COVID-19 Vaccine 04/19/2020 10:08 AM 0.3 mL 02/18/2019 Intramuscular   Manufacturer: [place]   Lot: [id number]   [place]: [id number]

## 2020-08-09 ENCOUNTER — Other Ambulatory Visit: Payer: Self-pay | Admitting: *Deleted

## 2020-08-09 DIAGNOSIS — N6320 Unspecified lump in the left breast, unspecified quadrant: Secondary | ICD-10-CM

## 2020-08-09 DIAGNOSIS — N6310 Unspecified lump in the right breast, unspecified quadrant: Secondary | ICD-10-CM

## 2020-08-11 ENCOUNTER — Other Ambulatory Visit: Payer: Self-pay

## 2020-08-11 NOTE — Progress Notes (Signed)
Error

## 2020-08-17 ENCOUNTER — Other Ambulatory Visit: Payer: Self-pay

## 2020-08-17 ENCOUNTER — Ambulatory Visit: Payer: Self-pay | Admitting: *Deleted

## 2020-08-17 ENCOUNTER — Ambulatory Visit
Admission: RE | Admit: 2020-08-17 | Discharge: 2020-08-17 | Disposition: A | Discharge status: Home / Self Care | Payer: No Typology Code available for payment source | Source: Ambulatory Visit | Attending: Obstetrics and Gynecology | Admitting: Obstetrics and Gynecology

## 2020-08-17 ENCOUNTER — Other Ambulatory Visit: Payer: Self-pay | Admitting: Obstetrics and Gynecology

## 2020-08-17 VITALS — BP 102/64 | Temp 97.3°F | Wt 113.5 lb

## 2020-08-17 DIAGNOSIS — N6320 Unspecified lump in the left breast, unspecified quadrant: Secondary | ICD-10-CM

## 2020-08-17 DIAGNOSIS — N6489 Other specified disorders of breast: Secondary | ICD-10-CM | POA: Diagnosis not present

## 2020-08-17 DIAGNOSIS — Z1239 Encounter for other screening for malignant neoplasm of breast: Secondary | ICD-10-CM

## 2020-08-17 DIAGNOSIS — N6310 Unspecified lump in the right breast, unspecified quadrant: Secondary | ICD-10-CM

## 2020-08-17 DIAGNOSIS — R922 Inconclusive mammogram: Secondary | ICD-10-CM | POA: Diagnosis not present

## 2020-08-17 DIAGNOSIS — N6322 Unspecified lump in the left breast, upper inner quadrant: Secondary | ICD-10-CM

## 2020-08-17 IMAGING — US US BREAST*R* LIMITED INC AXILLA
1 series · 13 of 17 positions shown · non-contrast
Comparison: Previous exam(s).

CLINICAL DATA: 54-year-old female with an enlarging palpable right
breast lump for approximately 1 year and palpable left breast lump
for 2 years.

EXAM:
DIGITAL DIAGNOSTIC BILATERAL MAMMOGRAM WITH CAD AND TOMO
ULTRASOUND BILATERAL BREAST

[Series 1: us breast*right* limited inc axilla · 0.06mm/px · 13 of 17 slices shown]
[im 1/17]
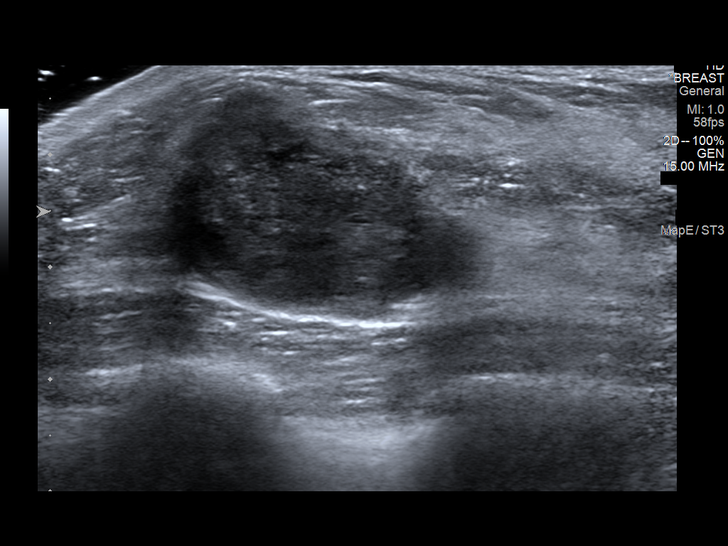
[im 2/17]
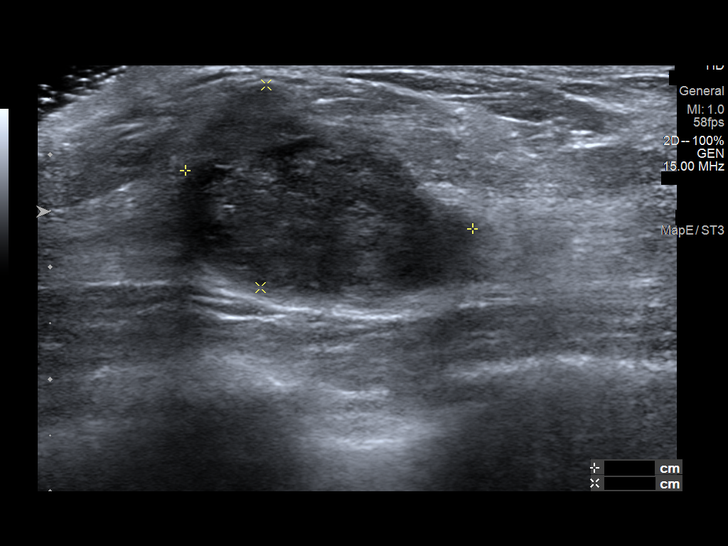
[im 4/17]
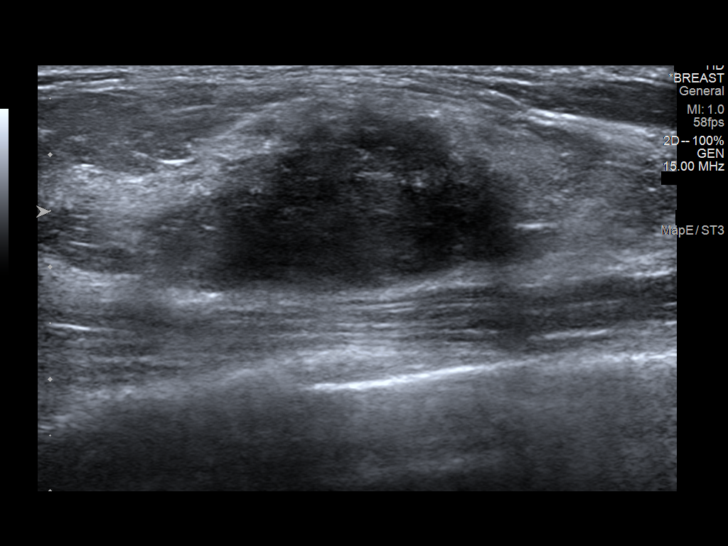
[im 5/17]
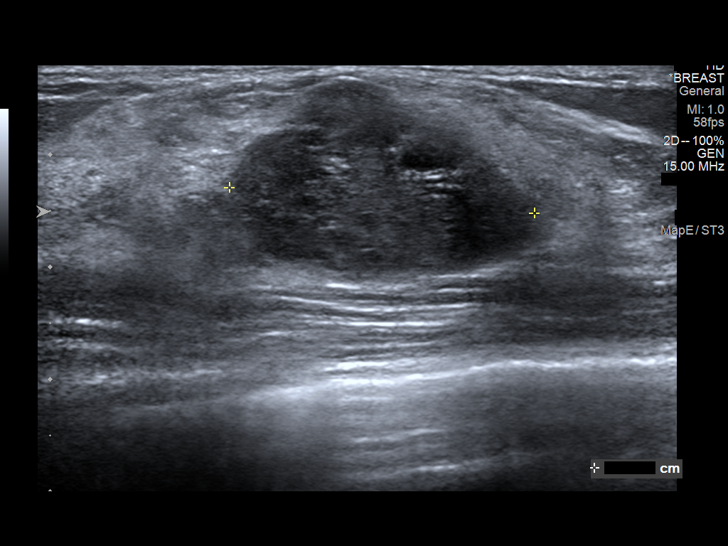
[im 6/17]
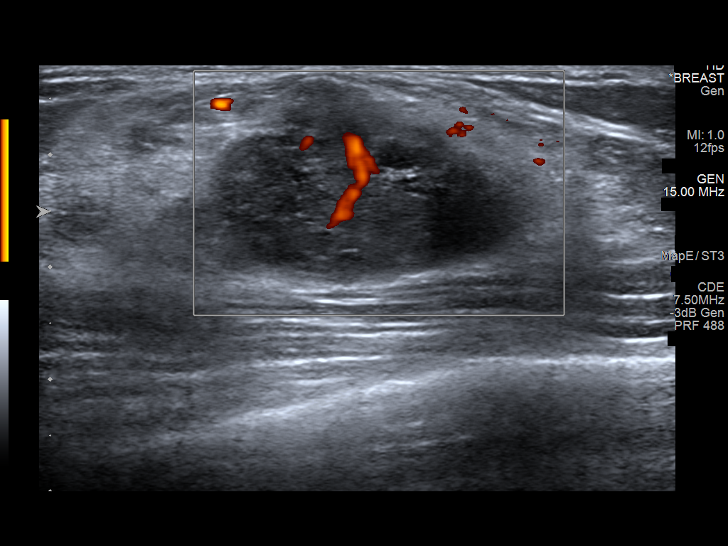
[im 8/17]
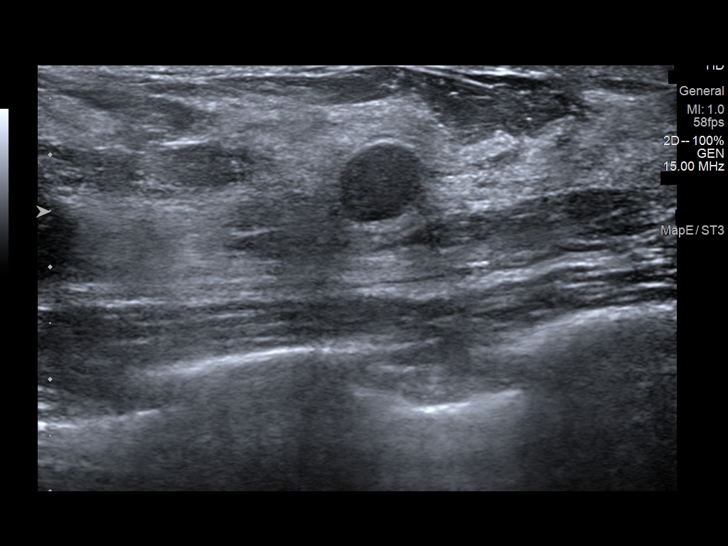
[im 9/17]
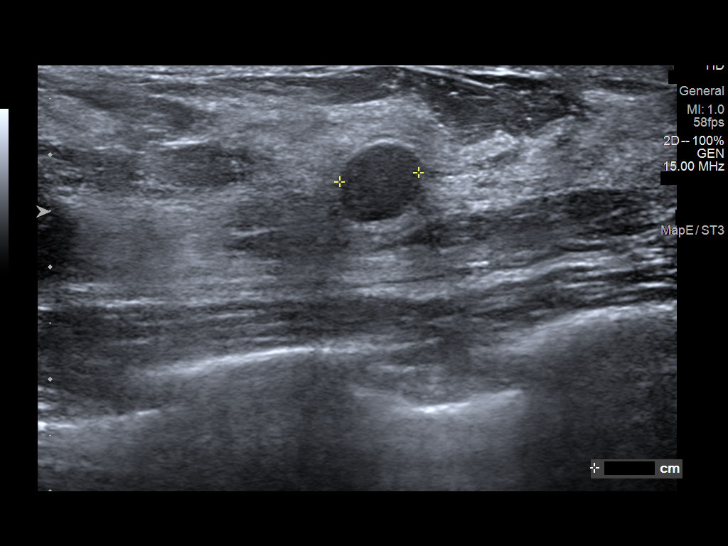
[im 10/17]
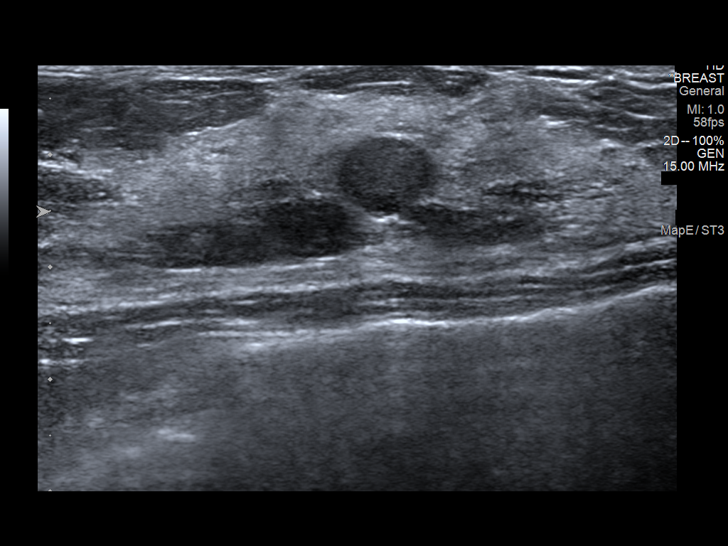
[im 12/17]
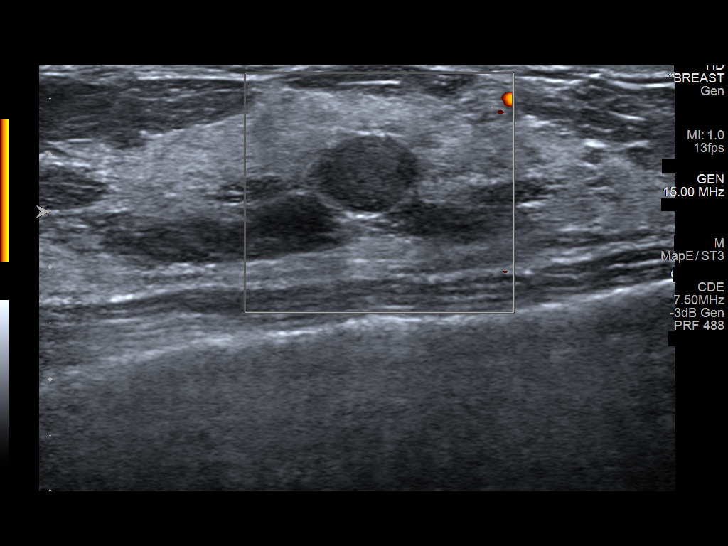
[im 13/17]
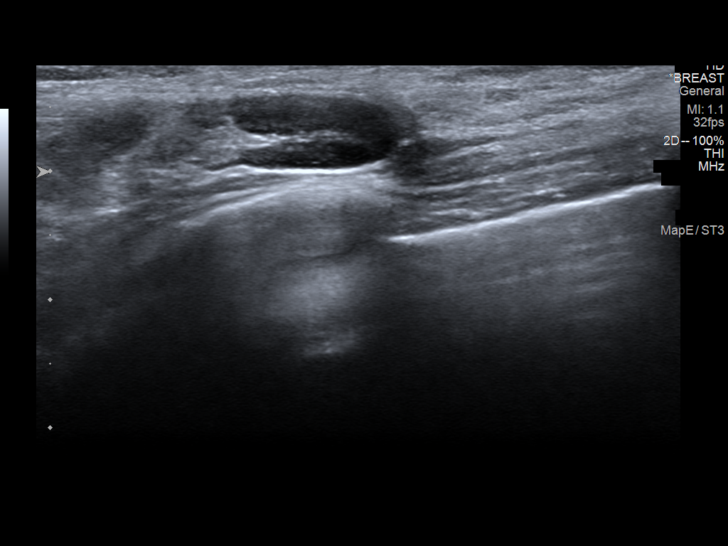
[im 14/17]
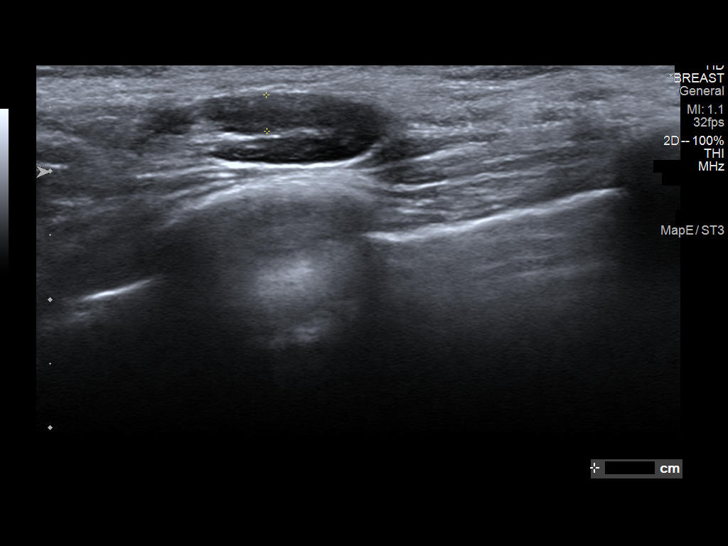
[im 16/17]
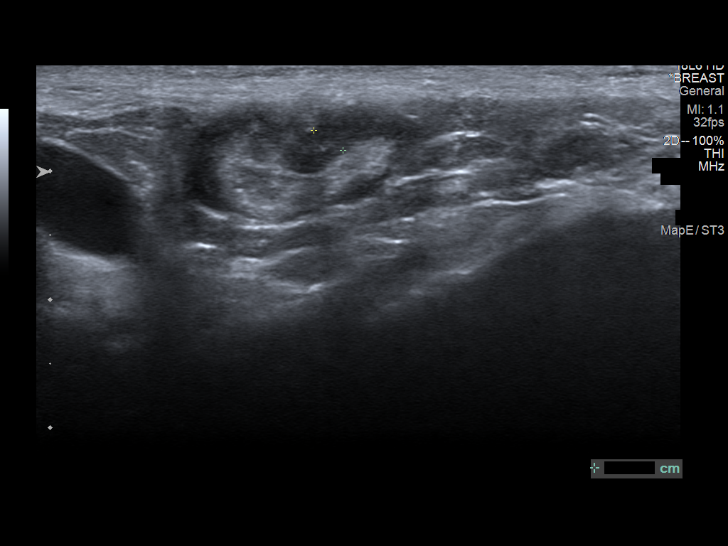
[im 17/17]
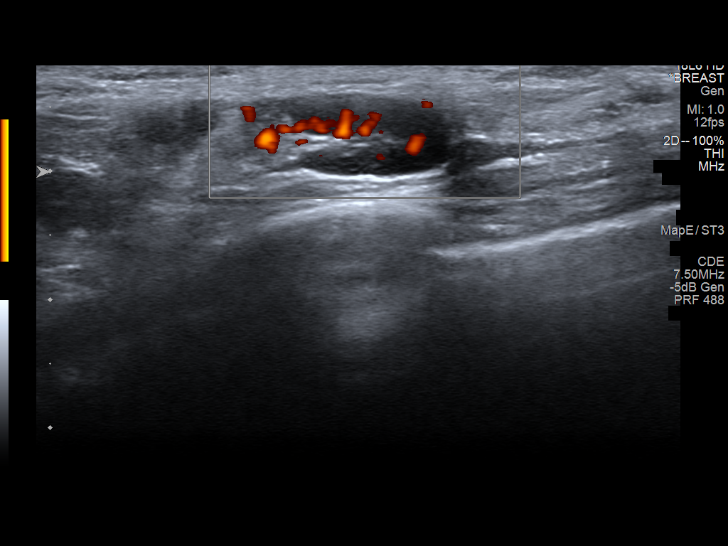

[13 of 17 positions shown; findings below may reference images not displayed]

ACR Breast Density Category c: The breast tissue is heterogeneously
dense, which may obscure small masses.
FINDINGS: Radiopaque BBs are placed at the site of the patient's bilateral
palpable lumps. An irregular, hyperdense mass is seen in the upper
inner quadrant at far posterior depth on the right, in correlation
with the radiopaque BB. An additional partially circumscribed mass
is identified in the central medial aspect at middle depth.

A circumscribed mixed echogenicity, fat containing mass is seen deep
to the radiopaque BB in the upper inner quadrant of the left breast.
No additional suspicious findings are identified.

Mammographic images were processed with CAD.

Targeted ultrasound is performed, showing an irregular hypoechoic
mass at the [DATE] position 5 cm from the nipple. It measures 2.7 x
2.6 x 1.8 cm and demonstrates internal vascularity. An additional
oval, circumscribed hypoechoic mass is identified at the [DATE]
position 1 cm from the nipple. It measures 8 x 7 x 7 mm. There is no
internal vascularity. Evaluation of the right axilla demonstrates
borderline cortical thickening up to 0.33 mm.

Evaluation of the left breast demonstrates a circumscribed mixed
echogenicity mass at the 11 o'clock position 5 cm from the nipple
measuring up to 4 cm. This corresponds with the mammographic finding
and is consistent with a hamartoma.
IMPRESSION: 1. Suspicious right breast mass at the [DATE] position 5 cm from the
nipple corresponding with the patient's palpable lump.
Recommendation is for ultrasound-guided biopsy.
2. Indeterminate right breast mass at the [DATE] position 1 cm from
the nipple. Recommendation is for ultrasound-guided biopsy.
3. Borderline right axillary lymphadenopathy. Recommendation is for
ultrasound-guided biopsy.
4. Benign left breast hamartoma corresponding with the patient's
palpable lump.

RECOMMENDATION:
1. Three area ultrasound-guided biopsy of the right breast/axilla.
2. Pending above biopsy results, further evaluation with contrast
enhanced breast MRI is recommended given the multiplicity of
findings and deep location of the patient's palpable mass.

I have discussed the findings and recommendations with the patient.
If applicable, a reminder letter will be sent to the patient
regarding the next appointment.

BI-RADS CATEGORY  5: Highly suggestive of malignancy.

## 2020-08-17 IMAGING — MG DIGITAL DIAGNOSTIC BILAT W/ TOMO W/ CAD
6 of 12 series · 6 of 36 positions shown · non-contrast
Comparison: Previous exam(s).

CLINICAL DATA: 54-year-old female with an enlarging palpable right
breast lump for approximately 1 year and palpable left breast lump
for 2 years.

EXAM:
DIGITAL DIAGNOSTIC BILATERAL MAMMOGRAM WITH CAD AND TOMO
ULTRASOUND BILATERAL BREAST

[R CC synth-2D]
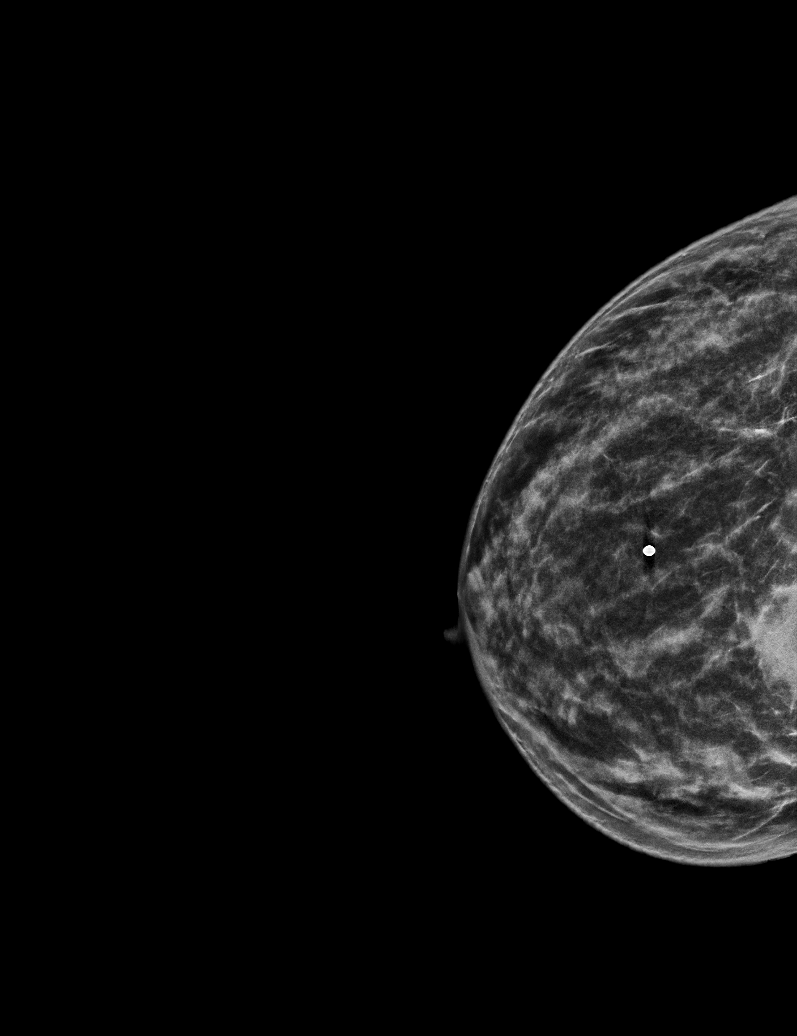

[R MLO synth-2D]
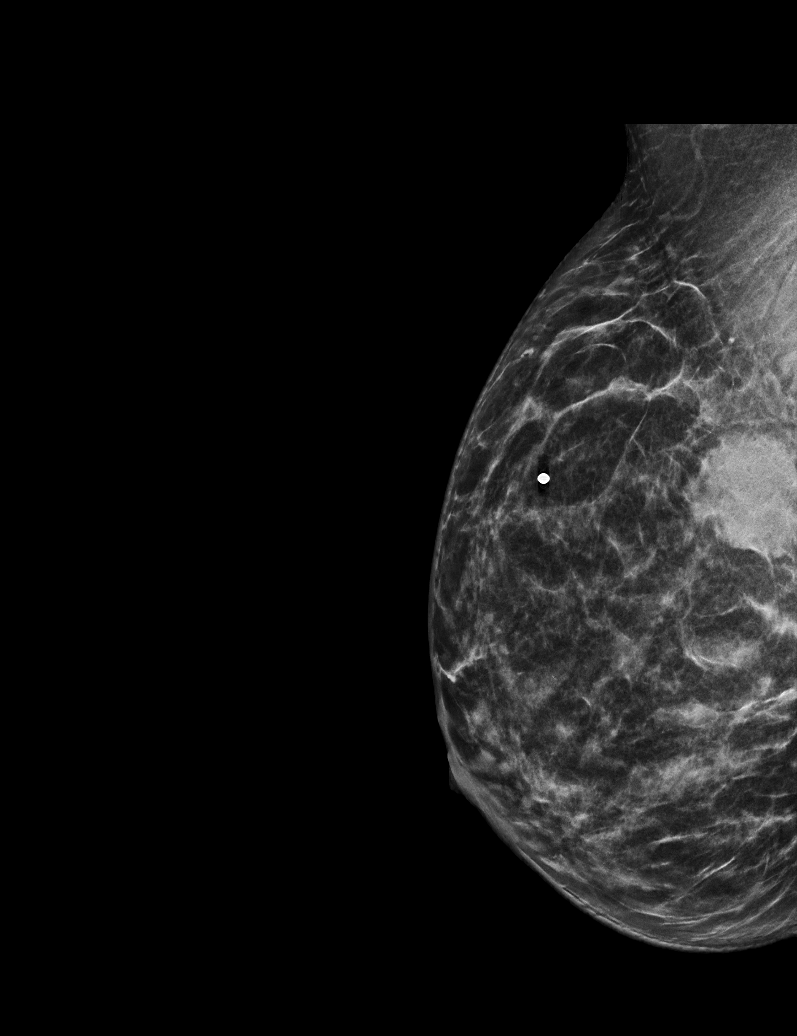

[L CC synth-2D]
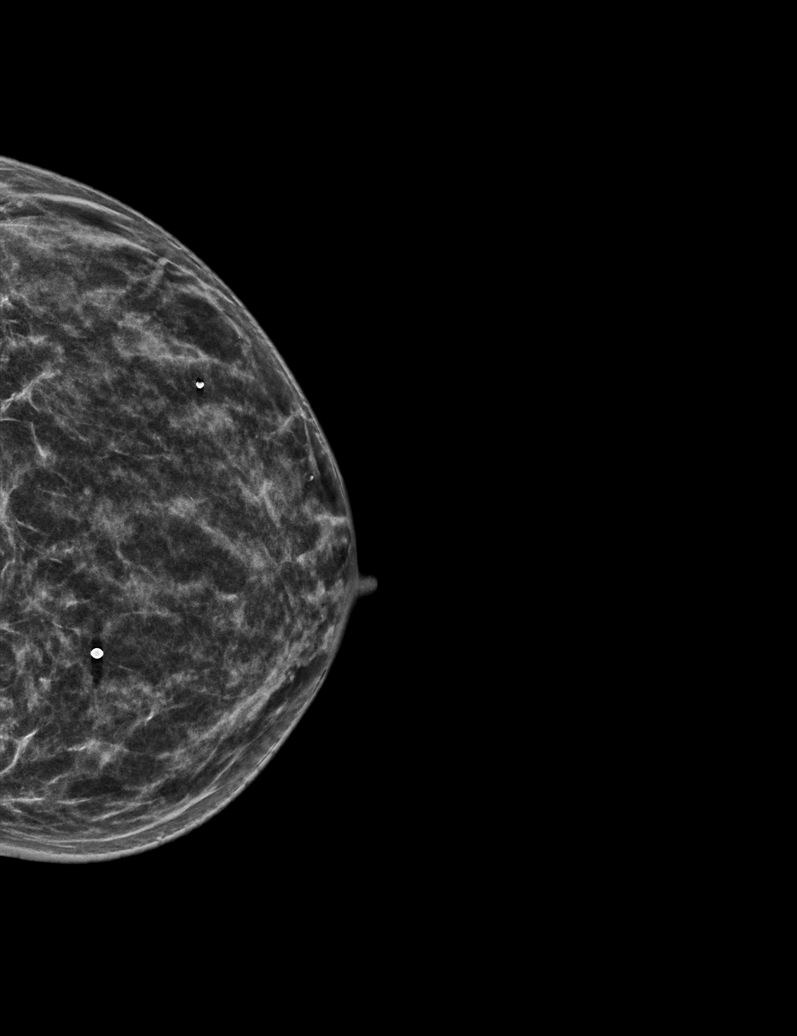

[L MLO synth-2D]
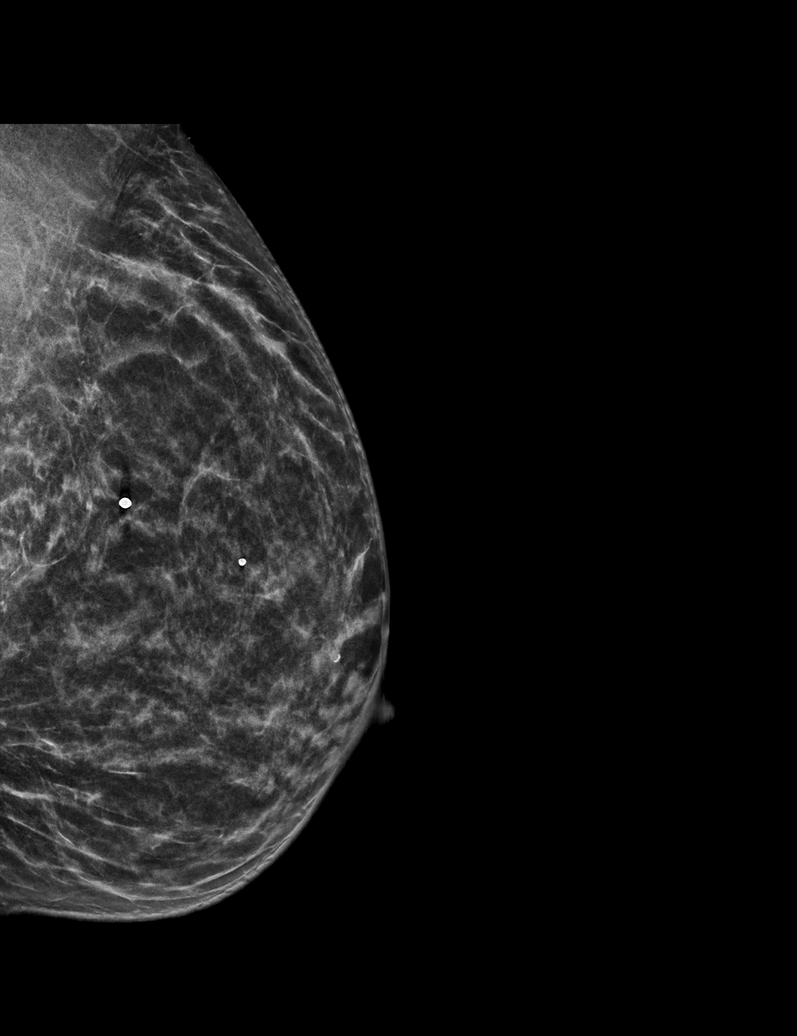

[R TAN synth-2D]
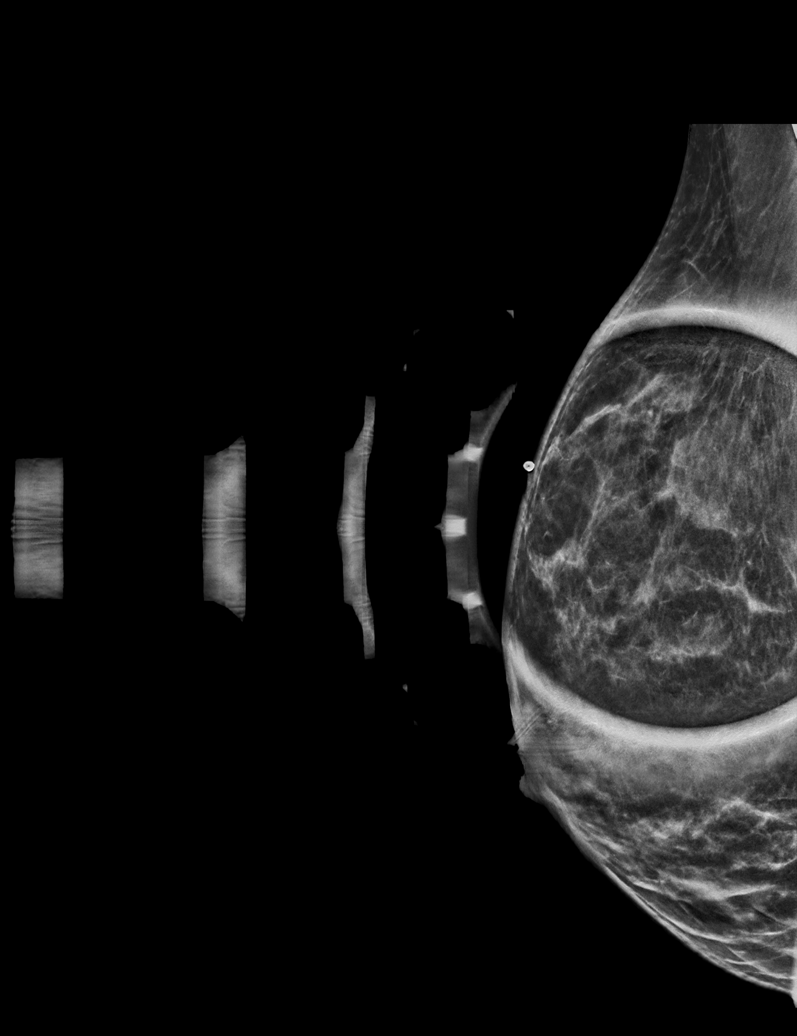

[L TAN synth-2D]
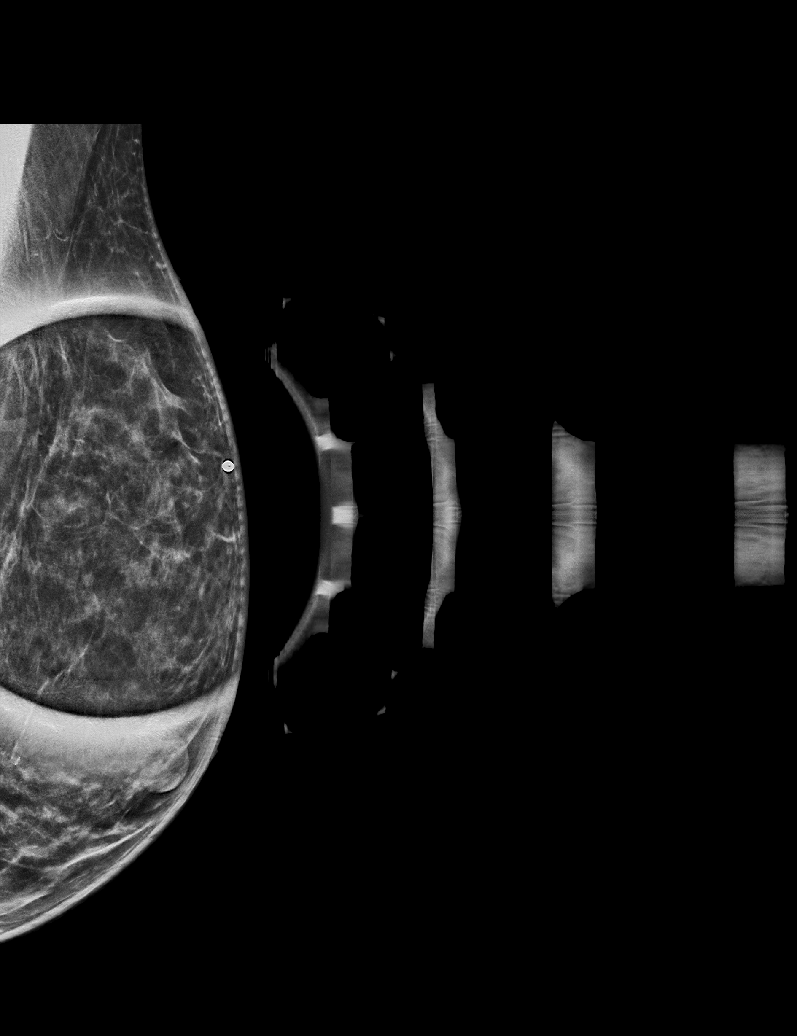

[6 of 36 positions shown; findings below may reference images not displayed]

ACR Breast Density Category c: The breast tissue is heterogeneously
dense, which may obscure small masses.
FINDINGS: Radiopaque BBs are placed at the site of the patient's bilateral
palpable lumps. An irregular, hyperdense mass is seen in the upper
inner quadrant at far posterior depth on the right, in correlation
with the radiopaque BB. An additional partially circumscribed mass
is identified in the central medial aspect at middle depth.

A circumscribed mixed echogenicity, fat containing mass is seen deep
to the radiopaque BB in the upper inner quadrant of the left breast.
No additional suspicious findings are identified.

Mammographic images were processed with CAD.

Targeted ultrasound is performed, showing an irregular hypoechoic
mass at the [DATE] position 5 cm from the nipple. It measures 2.7 x
2.6 x 1.8 cm and demonstrates internal vascularity. An additional
oval, circumscribed hypoechoic mass is identified at the [DATE]
position 1 cm from the nipple. It measures 8 x 7 x 7 mm. There is no
internal vascularity. Evaluation of the right axilla demonstrates
borderline cortical thickening up to 0.33 mm.

Evaluation of the left breast demonstrates a circumscribed mixed
echogenicity mass at the 11 o'clock position 5 cm from the nipple
measuring up to 4 cm. This corresponds with the mammographic finding
and is consistent with a hamartoma.
IMPRESSION: 1. Suspicious right breast mass at the [DATE] position 5 cm from the
nipple corresponding with the patient's palpable lump.
Recommendation is for ultrasound-guided biopsy.
2. Indeterminate right breast mass at the [DATE] position 1 cm from
the nipple. Recommendation is for ultrasound-guided biopsy.
3. Borderline right axillary lymphadenopathy. Recommendation is for
ultrasound-guided biopsy.
4. Benign left breast hamartoma corresponding with the patient's
palpable lump.

RECOMMENDATION:
1. Three area ultrasound-guided biopsy of the right breast/axilla.
2. Pending above biopsy results, further evaluation with contrast
enhanced breast MRI is recommended given the multiplicity of
findings and deep location of the patient's palpable mass.

I have discussed the findings and recommendations with the patient.
If applicable, a reminder letter will be sent to the patient
regarding the next appointment.

BI-RADS CATEGORY  5: Highly suggestive of malignancy.

## 2020-08-17 NOTE — Patient Instructions (Addendum)
Explained breast self  Amanda Rich. Patient did not need a Pap smear today due to last Pap smear and HPV typing was 09/22/2016. Let her know BCCCP will cover Pap smears and HPV typing every 5 years unless has a history of abnormal Pap smears. Referred patient to the [facility] for a diagnostic mammogram. Appointment scheduled Tuesday, August 17, 2020 at 1310. Patient aware of appointment and will be there. Discussed smoking cessation with patient. Referred to the [facility] Quitline and gave resources to the free smoking cessation program at [facility]. Amanda Rich verbalized understanding.  Dyami Umbach, Arvil Chaco, RN 10:22 AM

## 2020-08-17 NOTE — Progress Notes (Signed)
Amanda Rich is a 54 y.o. female who presents to [facility] clinic today with complaint of bilateral breast lumps and occasional right breast pain that feels like a dull ache. Patient rates the pain at a 3 out of 10. Patient rates. Patient first noticed the right breast lump around one year and the left breast lump more than 10 years ago. Patient stated the left breast lump has increased in size over the past year.     Pap Smear: Pap not smear completed today. Last Pap smear was 09/22/2016 at [facility] and was normal with negative HPV. Per patient has no history of an abnormal Pap smear. Last Pap smear result is available in Epic.   Physical exam: Breasts Breasts symmetrical. No skin abnormalities bilateral breasts. No nipple retraction bilateral breasts. No nipple discharge bilateral breasts. No lymphadenopathy. Palpated a mobile lump within the left breast at 11 o'clock 5 cm from then nipple. Palpated a lump within the right breast between 11 o'clock and 1 o'clock 2 cm from the nipple. No complaints of pain or tenderness on exam.     Pelvic/Bimanual Pap is not indicated today per BCCCP guidelines.    Smoking History: Patient is a current smoker. Discussed smoking cessation with patient. Referred to the [facility] Quitline and gave resources to the free smoking cessation program at [facility].   Patient Navigation: Patient education provided. Access to services provided for patient through [facility] program.   Colorectal Cancer Screening: Per patient has had colonoscopy completed on 03/29/2018. No complaints today.    Breast and Cervical Cancer Risk Assessment: Patient has family history of a maternal aunt and 2nd cousin that has had breast cancer. Patient has no known genetic mutations or history of radiation treatment to the chest before age 34. Patient does not have history of cervical dysplasia, immunocompromised, or DES exposure in-utero.  Risk Assessment    Risk  Scores      08/17/2020   Last edited by: Demetrius Revel, LPN   5-year risk: 0.9 %   Lifetime risk: 6.4 %          A: BCCCP exam without pap smear Complaints of bilateral breast lumps and right breat pain.  P: Referred patient to the [facility] for a diagnostic mammogram. Appointment scheduled Tuesday, August 17, 2020 at 1310.  Loletta Parish, RN 08/17/2020 10:22 AM

## 2020-08-23 ENCOUNTER — Ambulatory Visit
Admission: RE | Admit: 2020-08-23 | Discharge: 2020-08-23 | Disposition: A | Discharge status: Home / Self Care | Payer: No Typology Code available for payment source | Source: Ambulatory Visit | Attending: Obstetrics and Gynecology | Admitting: Obstetrics and Gynecology

## 2020-08-23 ENCOUNTER — Other Ambulatory Visit: Payer: Self-pay

## 2020-08-23 ENCOUNTER — Other Ambulatory Visit: Payer: Self-pay | Admitting: Obstetrics and Gynecology

## 2020-08-23 DIAGNOSIS — Z170 Estrogen receptor positive status [ER+]: Secondary | ICD-10-CM | POA: Diagnosis not present

## 2020-08-23 DIAGNOSIS — C50211 Malignant neoplasm of upper-inner quadrant of right female breast: Secondary | ICD-10-CM | POA: Diagnosis not present

## 2020-08-23 DIAGNOSIS — N6310 Unspecified lump in the right breast, unspecified quadrant: Secondary | ICD-10-CM

## 2020-08-23 IMAGING — US US  BREAST BX W/ LOC DEV 1ST LESION IMG BX SPEC US GUIDE*R*
1 series · 16 of 25 positions shown · non-contrast
Comparison: Previous exam(s).
COMPARISON: Previous exam(s).

Addendum:
CLINICAL DATA: Patient presents for ultrasound-guided core needle
biopsy of a highly suspicious, palpable mass in the right breast at
12:30 o'clock, 5 cm the nipple, a second, indeterminate, 8 mm right
breast mass at 12:30 o'clock, 1 cm from the nipple, and a right
axillary lymph node with a borderline thickened cortex to 3 mm.

EXAM:
ULTRASOUND GUIDED RIGHT BREAST CORE NEEDLE BIOPSY
ULTRASOUND GUIDED RIGHT AXILLA CORE NEEDLE BIOPSY

[Series 1: us breast bx w/ loc dev 1st lesion img bx spec us  · 0.07mm/px · 16 of 29 slices shown]
[im 1/29]
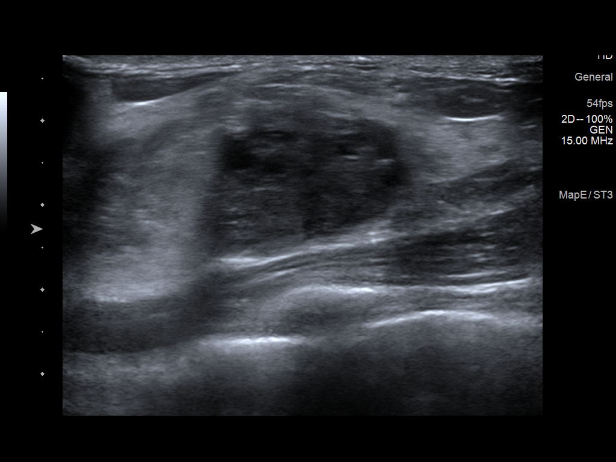
[im 3/29]
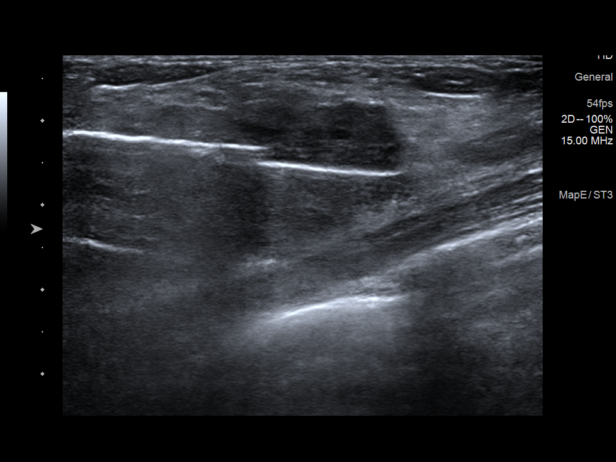
[im 4/29]
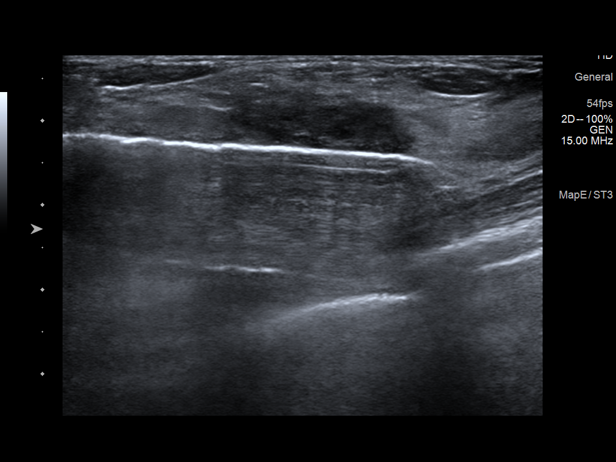
[im 6/29]
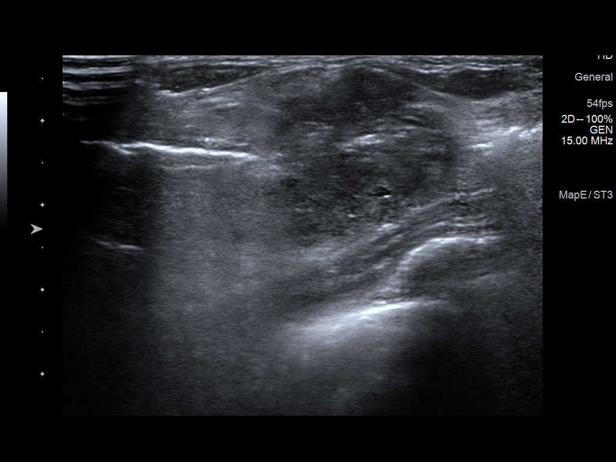
[im 9/29]
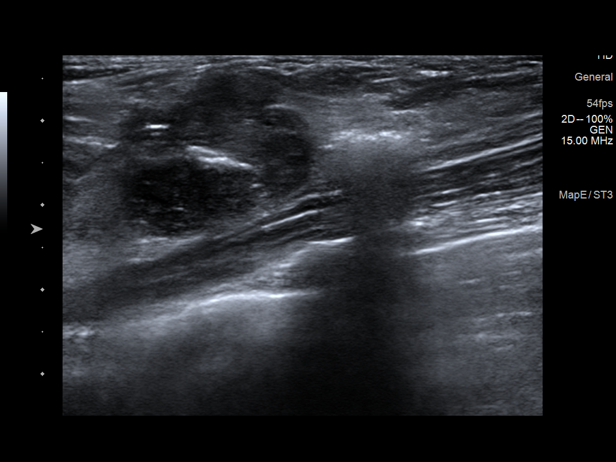
[im 10/29]
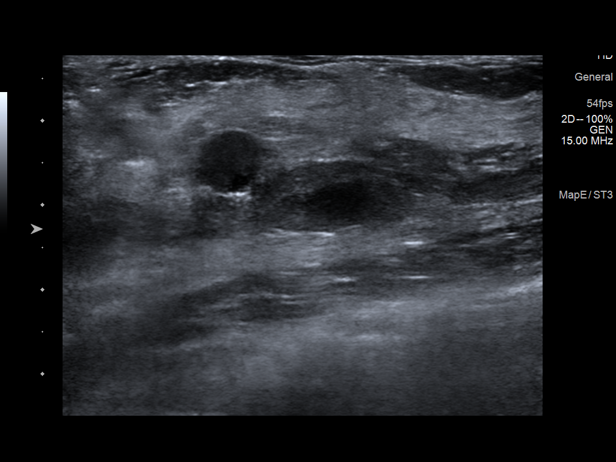
[im 12/29]
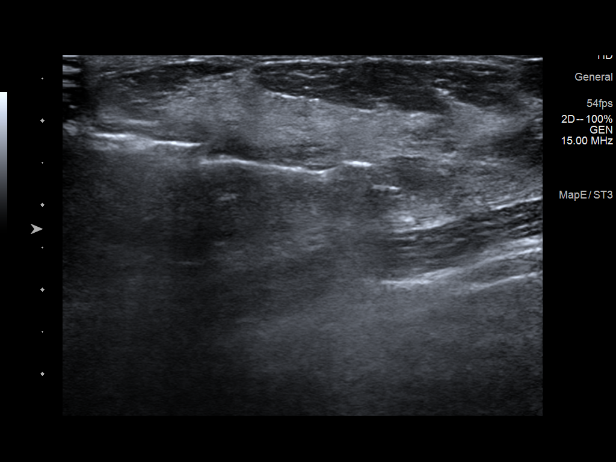
[im 13/29]
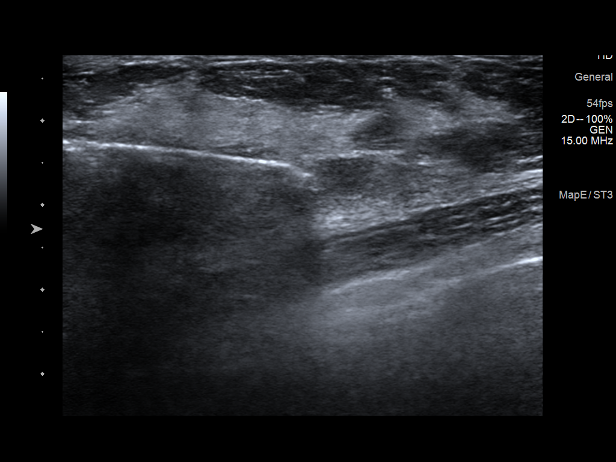
[im 16/29]
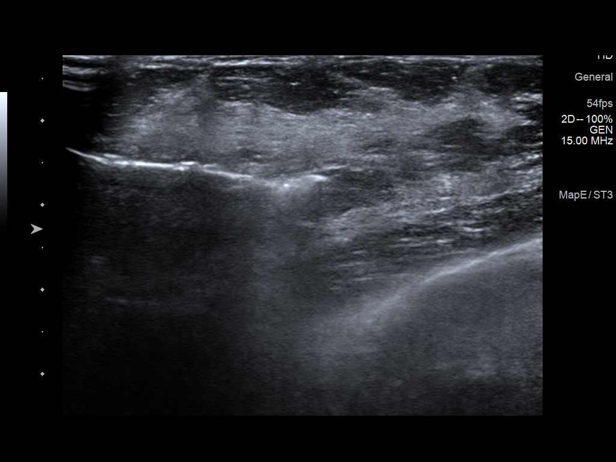
[im 17/29]
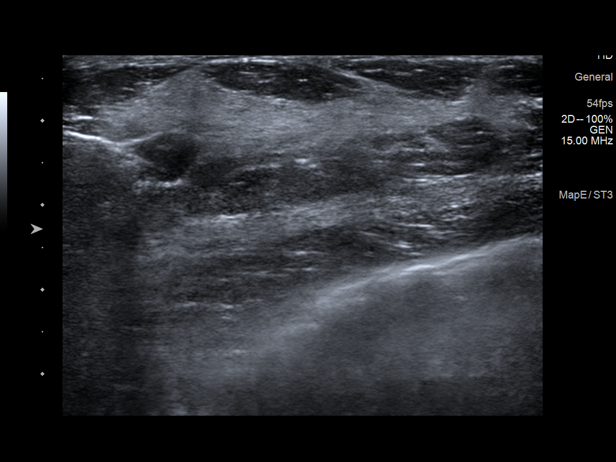
[im 19/29]
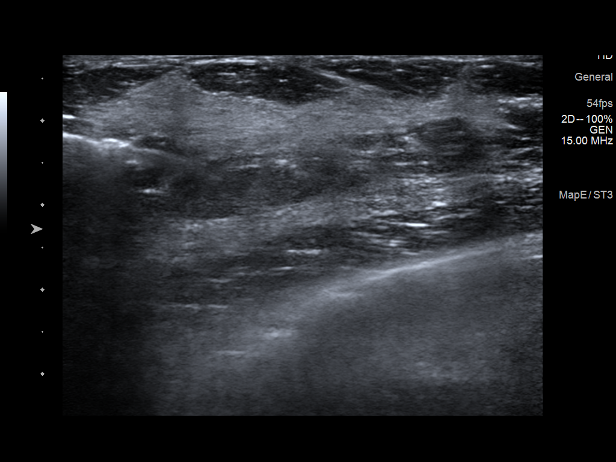
[im 20/29]
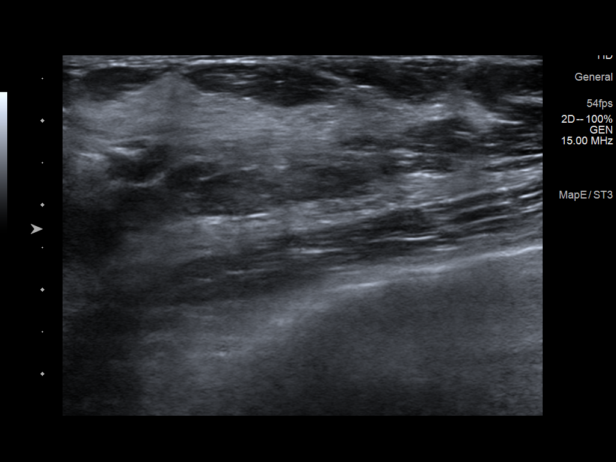
[im 23/29]
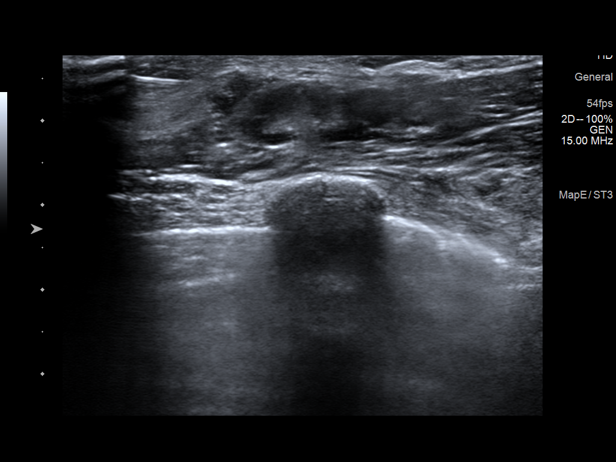
[im 25/29]
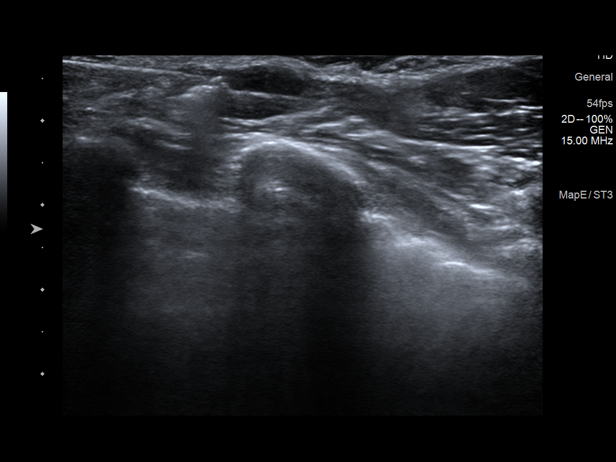
[im 26/29]
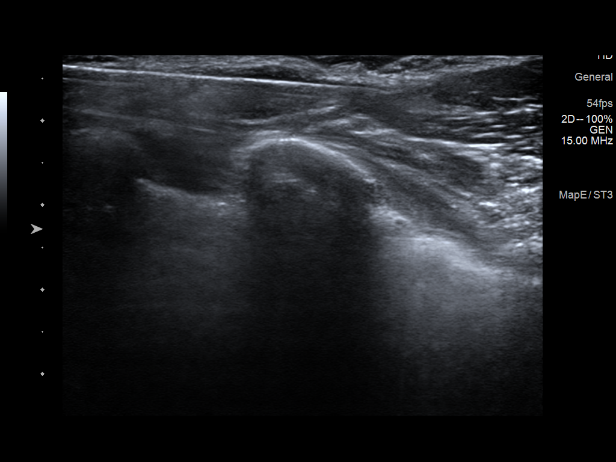
[im 29/29]
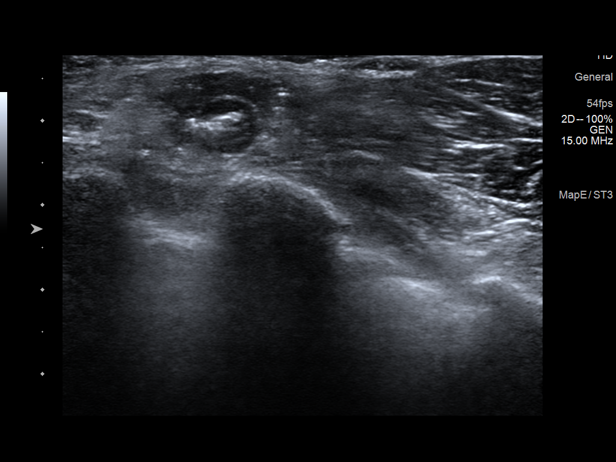

[16 of 25 positions shown; findings below may reference images not displayed]



Lesion #1: 12:30 o'clock, 5 cmfn.  2.7 cm mass.

Lesion quadrant: Upper inner quadrant

Using sterile technique and 1% Lidocaine as local anesthetic, under
direct ultrasound visualization, a 12 gauge ALFONSO device was
used to perform biopsy of the 2.7 cm mass using an inferior
approach. At the conclusion of the procedure ribbon shaped tissue
marker clip was deployed into the biopsy cavity.

Lesion #2: 12:30 o'clock, 1 cmfn.  8 mm mass.

Lesion quadrant: Upper inner quadrant

Using sterile technique and 1% Lidocaine as local anesthetic, under
direct ultrasound visualization, a 12 gauge ALFONSO device was
used to perform biopsy of the 8 mm mass using a medial approach. At
the conclusion of the procedure coil shaped tissue marker clip was
deployed into the biopsy cavity.

Lesion #3: Right axillary lymph node.

Lesion location: Right axilla.

Using sterile technique and 1% Lidocaine as local anesthetic, under
direct ultrasound visualization, a 14 gauge ALFONSO device was
used to perform biopsy of right axillary lymph node with borderline
thickened cortex using a inferolateral approach. At the conclusion
of the procedure Q shaped tissue marker clip was deployed into the
biopsy cavity.

Follow up 2 view mammogram was performed and dictated separately.
IMPRESSION: Ultrasound guided biopsy of 2 right breast masses and a borderline
right axillary lymph node. No apparent complications.

ADDENDUM:
Pathology revealed GRADE III INVASIVE DUCTAL CARCINOMA of the RIGHT
breast, 12:30 o'clock, 5 cmfn. This was found to be concordant by
Dr. ALFONSO.

Pathology revealed FIBROCYSTIC CHANGE of the RIGHT breast, 12:30
o'clock, 1 cmfn. This was found to be concordant by Dr. ALFONSO
ALFONSO.

Pathology revealed BENIGN LYMPH NODE of the RIGHT axilla. This was
found to be concordant by Dr. ALFONSO.

Pathology results were discussed with the patient by telephone. The
patient reported doing well after the biopsies with tenderness at
the sites. Post biopsy instructions and care were reviewed and
questions were answered. The patient was encouraged to call The

The patient was referred to [REDACTED]
[REDACTED] at [REDACTED] on
[DATE].

Breast MRI recommended for extent.

Pathology results reported by ALFONSO RN on [DATE].



Lesion #1: 12:30 o'clock, 5 cmfn.  2.7 cm mass.

Lesion quadrant: Upper inner quadrant

Using sterile technique and 1% Lidocaine as local anesthetic, under
direct ultrasound visualization, a 12 gauge ALFONSO device was
used to perform biopsy of the 2.7 cm mass using an inferior
approach. At the conclusion of the procedure ribbon shaped tissue
marker clip was deployed into the biopsy cavity.

Lesion #2: 12:30 o'clock, 1 cmfn.  8 mm mass.

Lesion quadrant: Upper inner quadrant

Using sterile technique and 1% Lidocaine as local anesthetic, under
direct ultrasound visualization, a 12 gauge ALFONSO device was
used to perform biopsy of the 8 mm mass using a medial approach. At
the conclusion of the procedure coil shaped tissue marker clip was
deployed into the biopsy cavity.

Lesion #3: Right axillary lymph node.

Lesion location: Right axilla.

Using sterile technique and 1% Lidocaine as local anesthetic, under
direct ultrasound visualization, a 14 gauge ALFONSO device was
used to perform biopsy of right axillary lymph node with borderline
thickened cortex using a inferolateral approach. At the conclusion
of the procedure Q shaped tissue marker clip was deployed into the
biopsy cavity.

Follow up 2 view mammogram was performed and dictated separately.
IMPRESSION: Ultrasound guided biopsy of 2 right breast masses and a borderline
right axillary lymph node. No apparent complications.

## 2020-08-23 IMAGING — MG MM BREAST LOCALIZATION CLIP
6 of 10 series · 6 of 30 positions shown · non-contrast
Comparison: Previous exam(s).

CLINICAL DATA: Status post ultrasound-guided core needle biopsy of
2 right breast masses and a right axillary lymph node.

EXAM:
DIAGNOSTIC RIGHT MAMMOGRAM POST ULTRASOUND BIOPSY

[R CC synth-2D (1 of 2)]
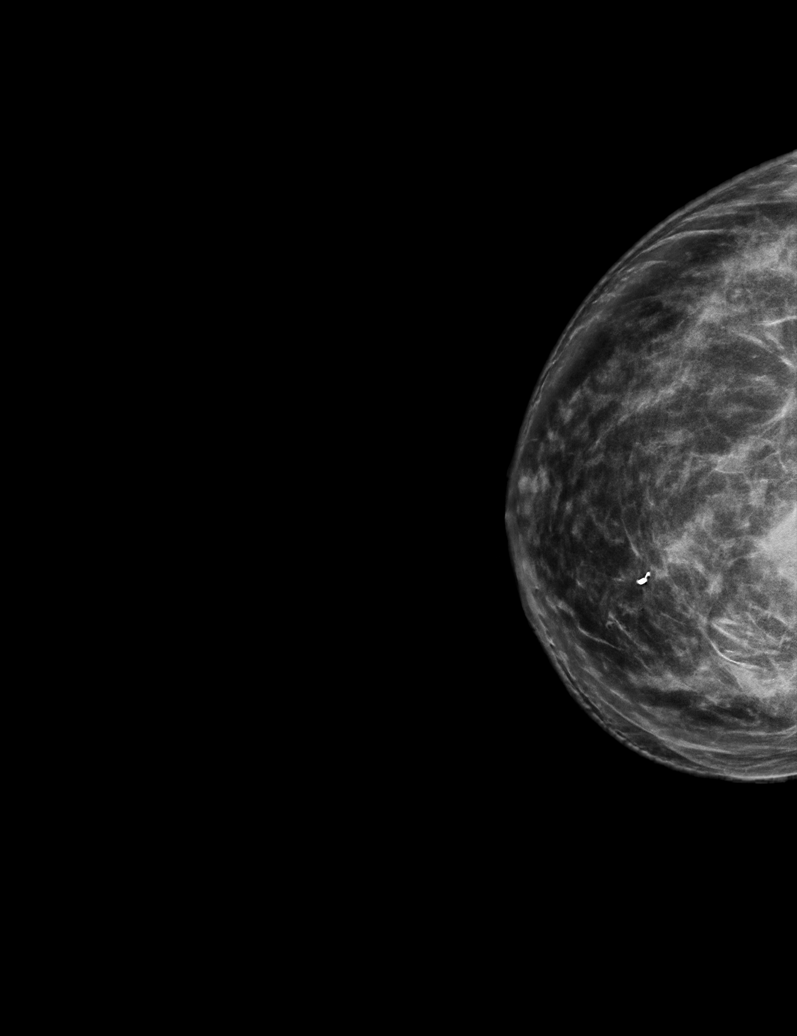

[R CC synth-2D (2 of 2)]
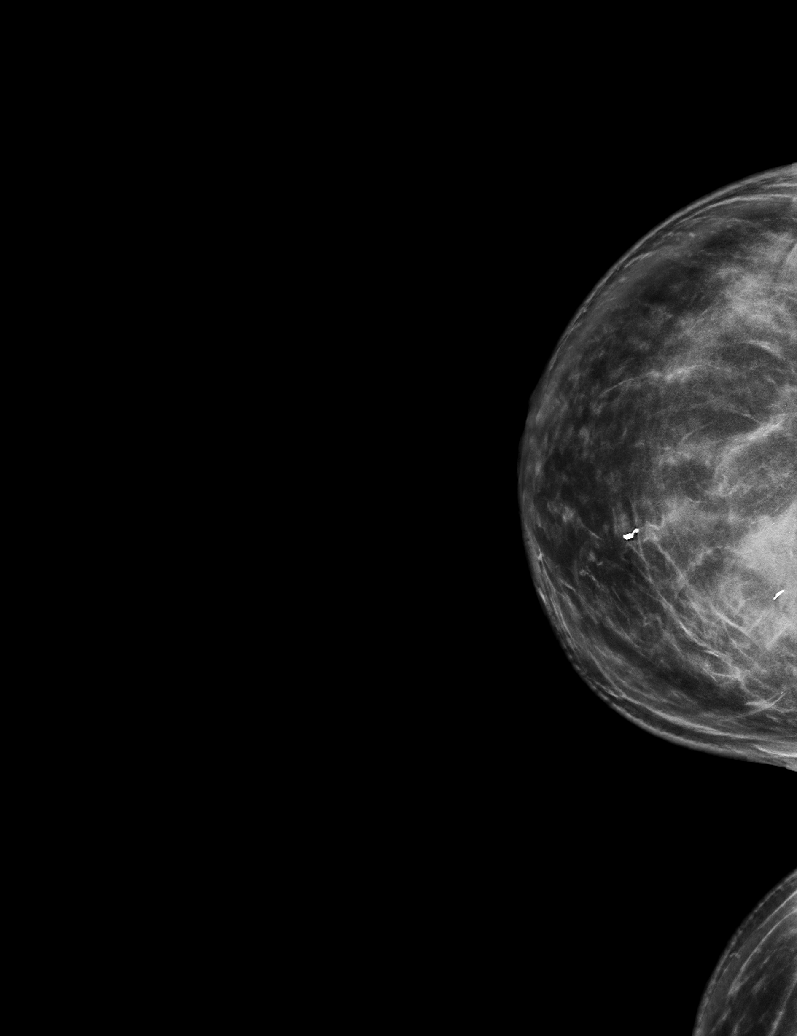

[R MLO synth-2D (1 of 2)]
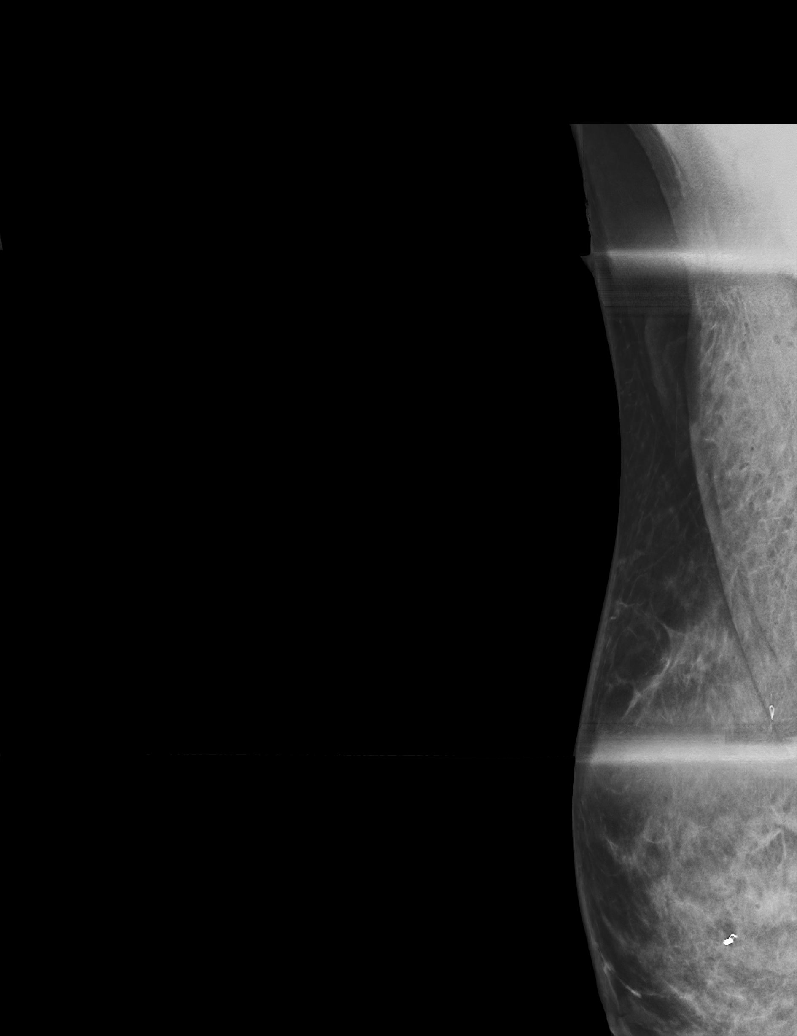

[R ML synth-2D]
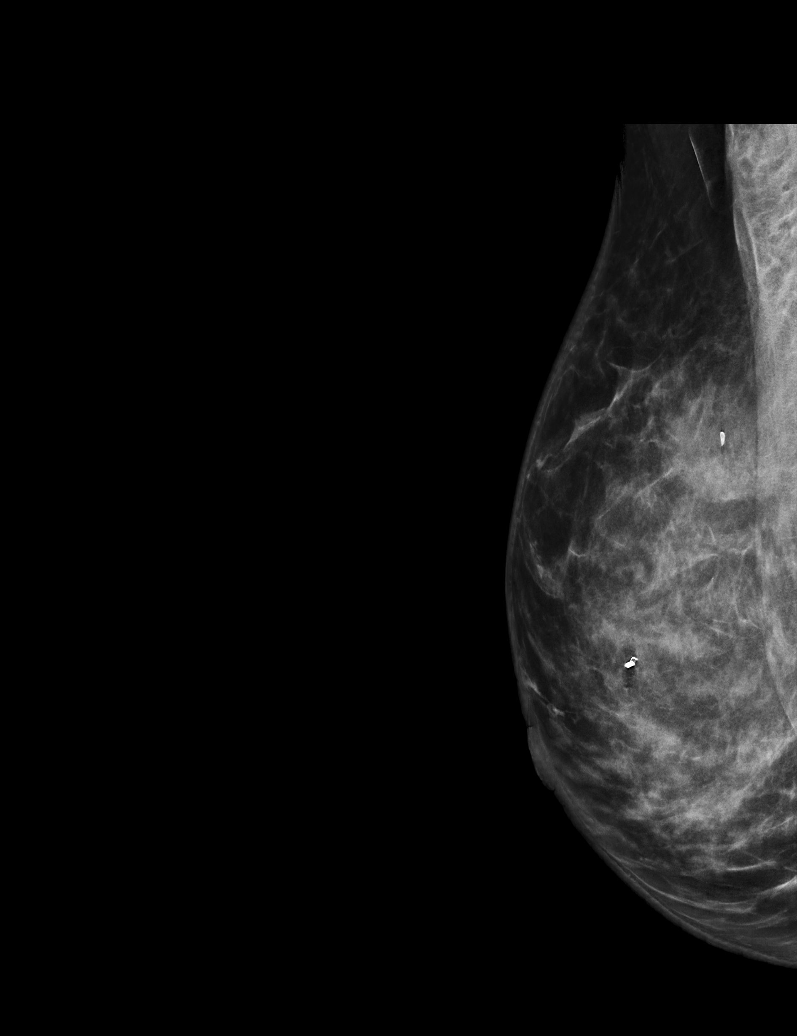

[R MLO synth-2D (2 of 2)]
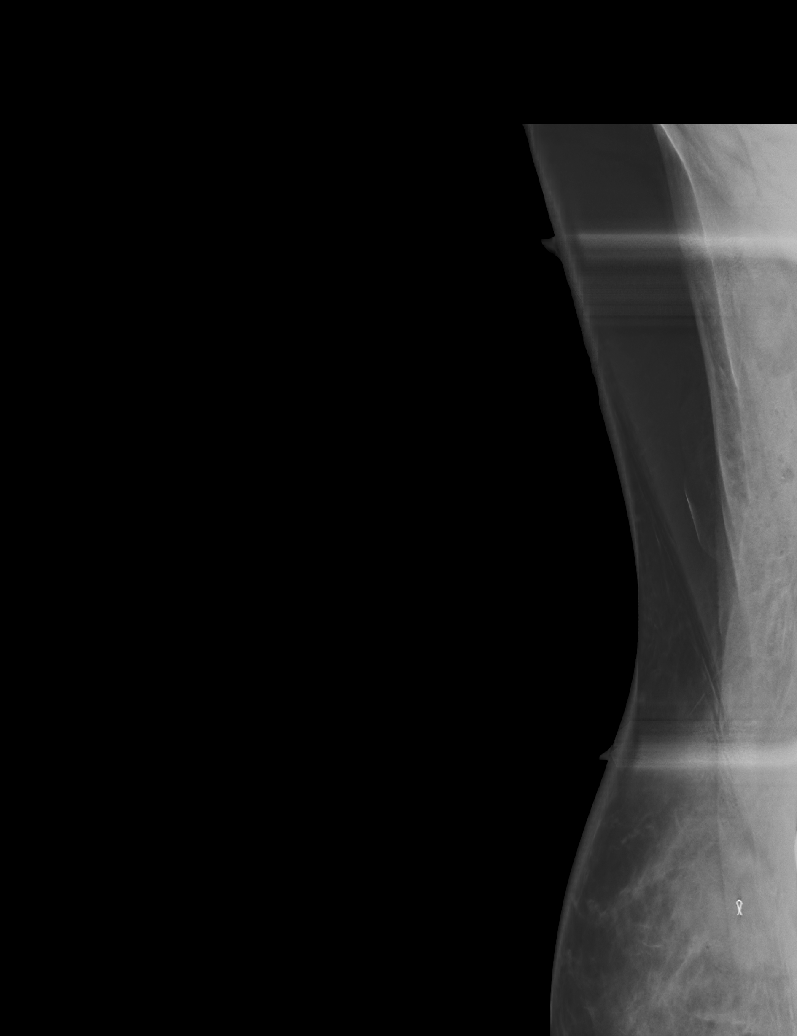

[R CC tomo · tomo slice 37/73.0]
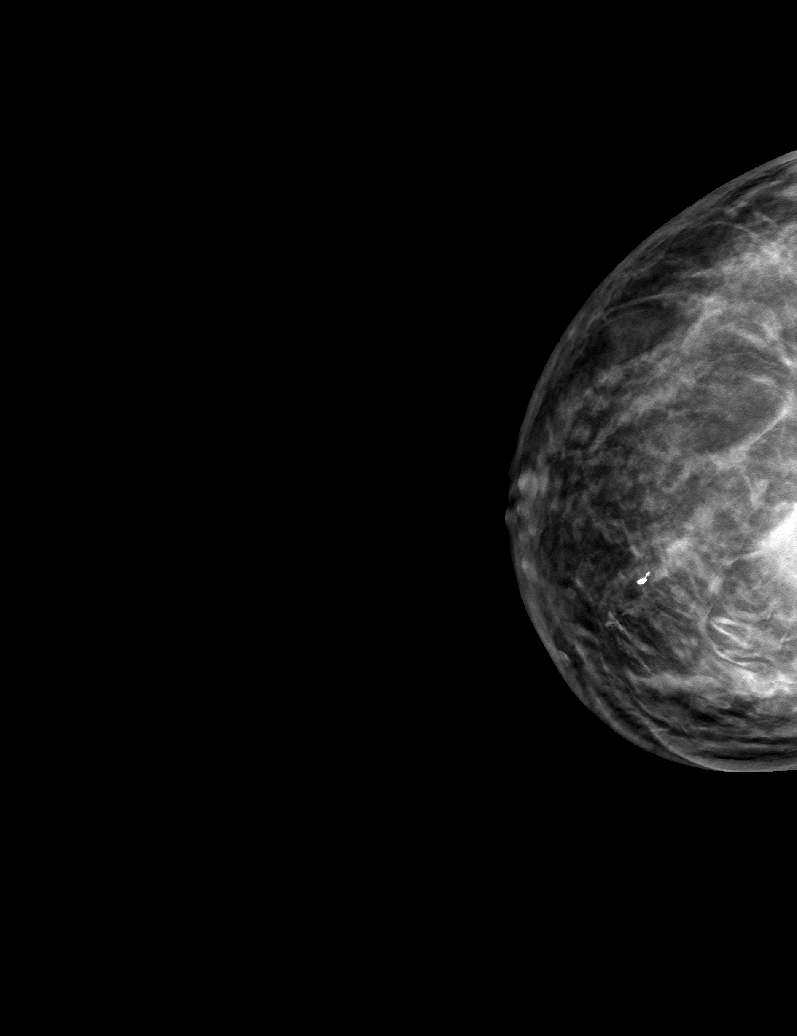

[6 of 30 positions shown; findings below may reference images not displayed]

FINDINGS: Mammographic images were obtained following ultrasound guided biopsy
of a palpable right breast mass at 12:30 o'clock, 5 cm the nipple, a
small, 8 mm, right breast mass at 12:30 o'clock, 1 cm from the
nipple, and a borderline abnormal right axillary lymph node. The
ribbon shaped biopsy clip lies within the palpable mass at 12:30
o'clock, 5 cm the nipple. The coil shaped biopsy clip lies in the
expected location of the smaller mass at 12:30 o'clock, 1 cm from
the nipple. The Q shaped biopsy clip placed within the axillary
lymph node cannot be visualized mammographically.
IMPRESSION: Appropriate positioning of the ribbon and coil shaped biopsy marking
clips at the site of biopsy in the upper inner right breast. The Q
shaped biopsy clip within the right axillary lymph node cannot be
visualized mammographically.

Final Assessment: Post Procedure Mammograms for Marker Placement

## 2020-08-24 ENCOUNTER — Telehealth: Payer: Self-pay | Admitting: Hematology and Oncology

## 2020-08-24 NOTE — Telephone Encounter (Signed)
Spoke to patient to confirm AM [facility] appointment for 9/8, explained how the day will go and that the surgeons office will also be calling, packet sent to email:  [email or web address].

## 2020-08-25 ENCOUNTER — Encounter: Payer: Self-pay | Admitting: *Deleted

## 2020-08-25 DIAGNOSIS — C50211 Malignant neoplasm of upper-inner quadrant of right female breast: Secondary | ICD-10-CM

## 2020-08-25 DIAGNOSIS — Z170 Estrogen receptor positive status [ER+]: Secondary | ICD-10-CM

## 2020-08-27 ENCOUNTER — Telehealth: Payer: Self-pay

## 2020-08-27 NOTE — Progress Notes (Signed)
Medicaid application completed. Patient to sign application at [facility] visit on 09/01/2020.

## 2020-08-27 NOTE — Telephone Encounter (Addendum)
Patient returned call, application was completed, will sign application at [place] on 09/01/2020.   Left message on voicemail requesting return call to office regarding medicaid application. I did speak with [name] on 08/26/2020 and informed that I will contact her on 08/27/2020 to complete the application.

## 2020-08-31 ENCOUNTER — Telehealth: Payer: Self-pay | Admitting: Hematology and Oncology

## 2020-08-31 NOTE — Progress Notes (Signed)
Radiation Oncology         [phone] ________________________________  Multidisciplinary Breast Oncology Clinic [facility]) Initial Outpatient Consultation  Name: Amanda Rich MRN: [id number]  Date: 09/01/2020  DOB: [birth year]  AQ:[id number], Laurian Brim, NP-C  [name], MD   REFERRING PHYSICIAN: [name], MD  DIAGNOSIS: The encounter diagnosis was Malignant neoplasm of 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, estrogen receptor positive ([name]).  Stage T2, N0, Mx, Right Breast UIQ, Invasive Ductal Carcinoma, ER+ / PR- / Her2+, Grade 3    ICD-10-CM   1. Malignant neoplasm of 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, estrogen receptor positive ([place])  C50.211    Z17.0     HISTORY OF PRESENT ILLNESS::Amanda Rich is a 54 y.o. female who is presenting to the office today for evaluation of her newly diagnosed breast cancer. She is accompanied by her sister. She is doing well overall.   She underwent bilateral diagnostic mammography with tomography and bilateral breast ultrasonography at [facility] on 08/17/2020 for one year history of enlarging palpable right breast lump and two year history of left breast lump. Results showed a suspicious right breast mass at the 12:30 position 5 cm from the nipple that corresponded with the patient's palpable lump. It also showed an indeterminate right breast mass at the 12:30 position 1 cm from the nipple. There was borderline right axillary lymphadenopathy. Finally, it showed benign left breast hamartoma that corresponded with the patient's palpable lump.  Biopsy on 08/23/2020 revealed grade 3 invasive ductal carcinoma at the 12:30 o'clock position of the right breast 5 cm from the nipple. Biopsy of 12:30 o'clock position 1 cm from the nipple showed fibrocystic change that was negative for carcinoma. A right axillary lymph node was also biopsied and was benign. Prognostic indicators were significant for estrogen receptor, 30%  positive with a weak staining intensity and progesterone receptor, 0% negative. Proliferation marker Ki67 at 50%. HER2 positive.  Menarche: 54 years old Age at first live birth: 54 years old GP: 3 LMP: Around 2017 Contraceptive: Yes, birth control pills from age [age] and Depo-Provera beginning at age 62. She is unsure how long she was on Depo. HRT: No   The patient was referred today for presentation in the multidisciplinary conference.  Radiology studies and pathology slides were presented there for review and discussion of treatment options.  A consensus was discussed regarding potential next steps.  PREVIOUS RADIATION THERAPY: No  PAST MEDICAL HISTORY:  Past Medical History:  Diagnosis Date  . Anemia   . Depression   . Smoker   . Umbilical hernia     PAST SURGICAL HISTORY: Past Surgical History:  Procedure Laterality Date  . ABLATION ON ENDOMETRIOSIS    . GANGLION CYST EXCISION  2007   L wrist; [facility], [place]  . TUBAL LIGATION     [facility]  . UMBILICAL HERNIA REPAIR  2005   [place]    FAMILY HISTORY:  Family History  Problem Relation Age of Onset  . Diabetes Mother   . Hypertension Mother   . Liver cancer Daughter   . Diabetes Maternal Aunt   . Breast cancer Maternal Aunt   . Colon cancer Maternal Uncle   . Colon cancer Cousin   . Breast cancer Cousin     SOCIAL HISTORY:  Social History   Socioeconomic History  . Marital status: Legally Separated    Spouse name: Not on file  . Number of children: 3  . Years of education: Not  on file  . Highest education level: Associate degree: academic program  Occupational History  . Not on file  Tobacco Use  . Smoking status: Current Some Day Smoker    Packs/day: 1.00    Years: 25.00    Pack years: 25.00    Types: Cigarettes  . Smokeless tobacco: Never Used  . Tobacco comment: smoking cigarettes again and not e-cigs  Vaping Use  . Vaping Use: Never used  Substance and Sexual Activity  . Alcohol use: Yes     Alcohol/week: 1.0 standard drink    Types: 1 Glasses of wine per week  . Drug use: No  . Sexual activity: Yes    Birth control/protection: Post-menopausal  Other Topics Concern  . Not on file  Social History Narrative  . Not on file   Social Determinants of Health   Financial Resource Strain: High Risk  . Difficulty of Paying Living Expenses: Very hard  Food Insecurity: Food Insecurity Present  . Worried About Charity fundraiser in the Last Year: Sometimes true  . Ran Out of Food in the Last Year: Never true  Transportation Needs: No Transportation Needs  . Lack of Transportation (Medical): No  . Lack of Transportation (Non-Medical): No  Physical Activity:   . Days of Exercise per Week: Not on file  . Minutes of Exercise per Session: Not on file  Stress:   . Feeling of Stress : Not on file  Social Connections:   . Frequency of Communication with Friends and Family: Not on file  . Frequency of Social Gatherings with Friends and Family: Not on file  . Attends Religious Services: Not on file  . Active Member of Clubs or Organizations: Not on file  . Attends Archivist Meetings: Not on file  . Marital Status: Not on file    ALLERGIES:  Allergies  Allergen Reactions  . Clarithromycin Swelling    Biaxin    MEDICATIONS:  Current Outpatient Medications  Medication Sig Dispense Refill  . albuterol (PROVENTIL HFA;VENTOLIN HFA) 108 (90 Base) MCG/ACT inhaler Inhale 2 puffs into the lungs every 6 (six) hours as needed for wheezing or shortness of breath. 1 Inhaler 0  . Biotin 5000 MCG CAPS Take 1 capsule by mouth daily.     . Cholecalciferol (VITAMIN D3) 2000 units TABS Take 1 tablet by mouth daily.     . citalopram (CELEXA) 20 MG tablet Take 1 tablet (20 mg total) by mouth daily. (Patient taking differently: Take 10 mg by mouth daily. ) 30 tablet 2  . Colchicine (MITIGARE) 0.6 MG CAPS Take 0.6 mg by mouth daily. 30 capsule 2  . dexamethasone (DECADRON) 4 MG tablet Take  2 tablets (8 mg total) by mouth daily. Take 1 tablet day before chemo and 1 tablet day after chemo with food 12 tablet 0  . lidocaine-prilocaine (EMLA) cream Apply to affected area once 30 g 3  . LORazepam (ATIVAN) 0.5 MG tablet Take 1 tablet (0.5 mg total) by mouth at bedtime as needed (Nausea or vomiting). 30 tablet 0  . Multiple Vitamin (MULTIVITAMIN WITH MINERALS) TABS Take 1 tablet by mouth daily.    . ondansetron (ZOFRAN) 8 MG tablet Take 1 tablet (8 mg total) by mouth 2 (two) times daily as needed (Nausea or vomiting). Start on the third day after chemotherapy. 30 tablet 1  . oseltamivir (TAMIFLU) 75 MG capsule Take 1 capsule (75 mg total) by mouth 2 (two) times daily. 10 capsule 0  . Prenatal Vit-DSS-Fe  Cbn-FA (PRENATAL ADVANTAGE PO) Take by mouth.    . prochlorperazine (COMPAZINE) 10 MG tablet Take 1 tablet (10 mg total) by mouth every 6 (six) hours as needed (Nausea or vomiting). 30 tablet 1  . TURMERIC PO Take 1 tablet by mouth daily.      No current facility-administered medications for this encounter.    REVIEW OF SYSTEMS: A 10+ POINT REVIEW OF SYSTEMS WAS OBTAINED including neurology, dermatology, psychiatry, cardiac, respiratory, lymph, extremities, GI, GU, musculoskeletal, constitutional, reproductive, HEENT. On the provided form, she reports weight change, loss of sleep, fatigue, wearing glasses, wearing dentures, irregular heartbeat, shortness of breath when walking, change in stool habits, lump and pain in right breast, joint pain, depression, hot flashes, and anemia. She denies dysuria, rash, headaches, nausea, vomiting, chest pain, cough, and any other symptoms.    PHYSICAL EXAM:   Vitals with BMI 09/01/2020  Height _0   Weight 116 lbs  BMI 62.6  Systolic 948  Diastolic 81  Pulse 76   Lungs are clear to auscultation bilaterally. Heart has regular rate and rhythm. No palpable cervical, supraclavicular, or axillary adenopathy. Abdomen soft, non-tender, normal bowel sounds.  Left breast with soft tissue swelling in the upper inner aspect that was approximately 4 cm in size and was consistent with hamartoma. No nipple discharge or bleeding. Right breast with a large mass in the upper inner quadrant that was approximately 4.0 x 4.5 cm in size with some associated bruising from recent biopsy. No nipple discharge or bleeding. Sore with palpation in the right axilla without palpable lymphadenopathy.   KPS = 90  100 - Normal; no complaints; no evidence of disease. 90   - Able to carry on normal activity; minor signs or symptoms of disease. 80   - Normal activity with effort; some signs or symptoms of disease. 1   - Cares for self; unable to carry on normal activity or to do active work. 60   - Requires occasional assistance, but is able to care for most of his personal needs. 50   - Requires considerable assistance and frequent medical care. 46   - Disabled; requires special care and assistance. 67   - Severely disabled; hospital admission is indicated although death not imminent. 85   - Very sick; hospital admission necessary; active supportive treatment necessary. 10   - Moribund; fatal processes progressing rapidly. 0     - Dead  Karnofsky DA, Abelmann [place], Craver LS and Burchenal [facility] [phone]) The use of the nitrogen mustards in the palliative treatment of carcinoma: with particular reference to bronchogenic carcinoma Cancer 1 [id number]  LABORATORY DATA:  Lab Results  Component Value Date   WBC 5.2 09/01/2020   HGB 13.0 09/01/2020   HCT 39.7 09/01/2020   MCV 86.5 09/01/2020   PLT 231 09/01/2020   Lab Results  Component Value Date   NA 143 09/01/2020   K 3.3 (L) 09/01/2020   CL 107 09/01/2020   CO2 27 09/01/2020   Lab Results  Component Value Date   ALT 13 09/01/2020   AST 17 09/01/2020   ALKPHOS 57 09/01/2020   BILITOT 0.6 09/01/2020    PULMONARY FUNCTION TEST:   Recent Review Flowsheet Data   There is no flowsheet data to display.      RADIOGRAPHY: US BREAST LTD UNI LEFT INC AXILLA  Result Date: 08/17/2020 CLINICAL DATA:  54 year old female with an enlarging palpable right breast lump for approximately 1 year and palpable left breast lump for 2 years.  EXAM: DIGITAL DIAGNOSTIC BILATERAL MAMMOGRAM WITH CAD AND TOMO ULTRASOUND BILATERAL BREAST COMPARISON:  Previous exam(s). ACR Breast Density Category c: The breast tissue is heterogeneously dense, which may obscure small masses. FINDINGS: Radiopaque BBs are placed at the site of the patient's bilateral palpable lumps. An irregular, hyperdense mass is seen in the upper inner quadrant at far posterior depth on the right, in correlation with the radiopaque BB. An additional partially circumscribed mass is identified in the central medial aspect at middle depth. A circumscribed mixed echogenicity, fat containing mass is seen deep to the radiopaque BB in the upper inner quadrant of the left breast. No additional suspicious findings are identified. Mammographic images were processed with CAD. Targeted ultrasound is performed, showing an irregular hypoechoic mass at the 12:30 position 5 cm from the nipple. It measures 2.7 x 2.6 x 1.8 cm and demonstrates internal vascularity. An additional oval, circumscribed hypoechoic mass is identified at the 12:30 position 1 cm from the nipple. It measures 8 x 7 x 7 mm. There is no internal vascularity. Evaluation of the right axilla demonstrates borderline cortical thickening up to 0.33 mm. Evaluation of the left breast demonstrates a circumscribed mixed echogenicity mass at the 11 o'clock position 5 cm from the nipple measuring up to 4 cm. This corresponds with the mammographic finding and is consistent with a hamartoma. IMPRESSION: 1. Suspicious right breast mass at the 12:30 position 5 cm from the nipple corresponding with the patient's palpable lump. Recommendation is for ultrasound-guided biopsy. 2. Indeterminate right breast mass at the 12:30 position 1 cm from  the nipple. Recommendation is for ultrasound-guided biopsy. 3. Borderline right axillary lymphadenopathy. Recommendation is for ultrasound-guided biopsy. 4. Benign left breast hamartoma corresponding with the patient's palpable lump. RECOMMENDATION: 1. Three area ultrasound-guided biopsy of the right breast/axilla. 2. Pending above biopsy results, further evaluation with contrast enhanced breast MRI is recommended given the multiplicity of findings and deep location of the patient's palpable mass. I have discussed the findings and recommendations with the patient. If applicable, a reminder letter will be sent to the patient regarding the next appointment. BI-RADS CATEGORY  5: Highly suggestive of malignancy. Electronically Signed   By: [name] M.D.   On: 08/17/2020 14:33   US BREAST LTD UNI RIGHT INC AXILLA  Result Date: 08/17/2020 CLINICAL DATA:  54 year old female with an enlarging palpable right breast lump for approximately 1 year and palpable left breast lump for 2 years. EXAM: DIGITAL DIAGNOSTIC BILATERAL MAMMOGRAM WITH CAD AND TOMO ULTRASOUND BILATERAL BREAST COMPARISON:  Previous exam(s). ACR Breast Density Category c: The breast tissue is heterogeneously dense, which may obscure small masses. FINDINGS: Radiopaque BBs are placed at the site of the patient's bilateral palpable lumps. An irregular, hyperdense mass is seen in the upper inner quadrant at far posterior depth on the right, in correlation with the radiopaque BB. An additional partially circumscribed mass is identified in the central medial aspect at middle depth. A circumscribed mixed echogenicity, fat containing mass is seen deep to the radiopaque BB in the upper inner quadrant of the left breast. No additional suspicious findings are identified. Mammographic images were processed with CAD. Targeted ultrasound is performed, showing an irregular hypoechoic mass at the 12:30 position 5 cm from the nipple. It measures 2.7 x 2.6 x 1.8 cm  and demonstrates internal vascularity. An additional oval, circumscribed hypoechoic mass is identified at the 12:30 position 1 cm from the nipple. It measures 8 x 7 x 7 mm. There is no internal vascularity.  Evaluation of the right axilla demonstrates borderline cortical thickening up to 0.33 mm. Evaluation of the left breast demonstrates a circumscribed mixed echogenicity mass at the 11 o'clock position 5 cm from the nipple measuring up to 4 cm. This corresponds with the mammographic finding and is consistent with a hamartoma. IMPRESSION: 1. Suspicious right breast mass at the 12:30 position 5 cm from the nipple corresponding with the patient's palpable lump. Recommendation is for ultrasound-guided biopsy. 2. Indeterminate right breast mass at the 12:30 position 1 cm from the nipple. Recommendation is for ultrasound-guided biopsy. 3. Borderline right axillary lymphadenopathy. Recommendation is for ultrasound-guided biopsy. 4. Benign left breast hamartoma corresponding with the patient's palpable lump. RECOMMENDATION: 1. Three area ultrasound-guided biopsy of the right breast/axilla. 2. Pending above biopsy results, further evaluation with contrast enhanced breast MRI is recommended given the multiplicity of findings and deep location of the patient's palpable mass. I have discussed the findings and recommendations with the patient. If applicable, a reminder letter will be sent to the patient regarding the next appointment. BI-RADS CATEGORY  5: Highly suggestive of malignancy. Electronically Signed   By: [name] M.D.   On: 08/17/2020 14:33   [place] AXILLARY NODE CORE BIOPSY RIGHT  Addendum Date: 08/24/2020   ADDENDUM REPORT: 08/24/2020 14:44 ADDENDUM: Pathology revealed GRADE III INVASIVE DUCTAL CARCINOMA of the RIGHT breast, 12:30 o'clock, 5 cmfn. This was found to be concordant by Dr. Lajean Manes. Pathology revealed FIBROCYSTIC CHANGE of the RIGHT breast, 12:30 o'clock, 1 cmfn. This was found to be  concordant by Dr. Lajean Manes. Pathology revealed BENIGN LYMPH NODE of the RIGHT axilla. This was found to be concordant by Dr. Lajean Manes. Pathology results were discussed with the patient by telephone. The patient reported doing well after the biopsies with tenderness at the sites. Post biopsy instructions and care were reviewed and questions were answered. The patient was encouraged to call [facility] for any additional concerns. The patient was referred to [facility] on September 01, 2020. Breast MRI recommended for extent. Pathology results reported by Stacie Acres RN on 08/24/2020. Electronically Signed   By: Lajean Manes M.D.   On: 08/24/2020 14:44   Result Date: 08/24/2020 CLINICAL DATA:  Patient presents for ultrasound-guided core needle biopsy of a highly suspicious, palpable mass in the right breast at 12:30 o'clock, 5 cm the nipple, a second, indeterminate, 8 mm right breast mass at 12:30 o'clock, 1 cm from the nipple, and a right axillary lymph node with a borderline thickened cortex to 3 mm. EXAM: ULTRASOUND GUIDED RIGHT BREAST CORE NEEDLE BIOPSY ULTRASOUND GUIDED RIGHT BREAST CORE NEEDLE BIOPSY ULTRASOUND GUIDED RIGHT AXILLA CORE NEEDLE BIOPSY COMPARISON:  Previous exam(s). PROCEDURE: I met with the patient and we discussed the procedure of ultrasound-guided biopsy, including benefits and alternatives. We discussed the high likelihood of a successful procedure. We discussed the risks of the procedure, including infection, bleeding, tissue injury, clip migration, and inadequate sampling. Informed written consent was given. The usual time-out protocol was performed immediately prior to the procedure. Lesion #1: 12:30 o'clock, 5 cmfn.  2.7 cm mass. Lesion quadrant: Upper inner quadrant Using sterile technique and 1% Lidocaine as local anesthetic, under direct ultrasound visualization, a 12 gauge  spring-loaded device was used to perform biopsy of the 2.7 cm mass using an inferior approach. At the conclusion of the procedure ribbon shaped tissue marker clip was deployed into the biopsy cavity. Lesion #2:  12:30 o'clock, 1 cmfn.  8 mm mass. Lesion quadrant: Upper inner quadrant Using sterile technique and 1% Lidocaine as local anesthetic, under direct ultrasound visualization, a 12 gauge spring-loaded device was used to perform biopsy of the 8 mm mass using a medial approach. At the conclusion of the procedure coil shaped tissue marker clip was deployed into the biopsy cavity. Lesion #3: Right axillary lymph node. Lesion location: Right axilla. Using sterile technique and 1% Lidocaine as local anesthetic, under direct ultrasound visualization, a 14 gauge spring-loaded device was used to perform biopsy of right axillary lymph node with borderline thickened cortex using a inferolateral approach. At the conclusion of the procedure Q shaped tissue marker clip was deployed into the biopsy cavity. Follow up 2 view mammogram was performed and dictated separately. IMPRESSION: Ultrasound guided biopsy of 2 right breast masses and a borderline right axillary lymph node. No apparent complications. Electronically Signed: By: Lajean Manes M.D. On: 08/23/2020 09:45   MS DIGITAL DIAG TOMO BILAT  Result Date: 08/17/2020 CLINICAL DATA:  54 year old female with an enlarging palpable right breast lump for approximately 1 year and palpable left breast lump for 2 years. EXAM: DIGITAL DIAGNOSTIC BILATERAL MAMMOGRAM WITH CAD AND TOMO ULTRASOUND BILATERAL BREAST COMPARISON:  Previous exam(s). ACR Breast Density Category c: The breast tissue is heterogeneously dense, which may obscure small masses. FINDINGS: Radiopaque BBs are placed at the site of the patient's bilateral palpable lumps. An irregular, hyperdense mass is seen in the upper inner quadrant at far posterior depth on the right, in correlation with the radiopaque BB. An  additional partially circumscribed mass is identified in the central medial aspect at middle depth. A circumscribed mixed echogenicity, fat containing mass is seen deep to the radiopaque BB in the upper inner quadrant of the left breast. No additional suspicious findings are identified. Mammographic images were processed with CAD. Targeted ultrasound is performed, showing an irregular hypoechoic mass at the 12:30 position 5 cm from the nipple. It measures 2.7 x 2.6 x 1.8 cm and demonstrates internal vascularity. An additional oval, circumscribed hypoechoic mass is identified at the 12:30 position 1 cm from the nipple. It measures 8 x 7 x 7 mm. There is no internal vascularity. Evaluation of the right axilla demonstrates borderline cortical thickening up to 0.33 mm. Evaluation of the left breast demonstrates a circumscribed mixed echogenicity mass at the 11 o'clock position 5 cm from the nipple measuring up to 4 cm. This corresponds with the mammographic finding and is consistent with a hamartoma. IMPRESSION: 1. Suspicious right breast mass at the 12:30 position 5 cm from the nipple corresponding with the patient's palpable lump. Recommendation is for ultrasound-guided biopsy. 2. Indeterminate right breast mass at the 12:30 position 1 cm from the nipple. Recommendation is for ultrasound-guided biopsy. 3. Borderline right axillary lymphadenopathy. Recommendation is for ultrasound-guided biopsy. 4. Benign left breast hamartoma corresponding with the patient's palpable lump. RECOMMENDATION: 1. Three area ultrasound-guided biopsy of the right breast/axilla. 2. Pending above biopsy results, further evaluation with contrast enhanced breast MRI is recommended given the multiplicity of findings and deep location of the patient's palpable mass. I have discussed the findings and recommendations with the patient. If applicable, a reminder letter will be sent to the patient regarding the next appointment. BI-RADS CATEGORY  5:  Highly suggestive of malignancy. Electronically Signed   By: [name] M.D.   On: 08/17/2020 14:33   MM CLIP PLACEMENT RIGHT  Result Date: 08/23/2020 CLINICAL DATA:  Status post ultrasound-guided core needle  biopsy of 2 right breast masses and a right axillary lymph node. EXAM: DIAGNOSTIC RIGHT MAMMOGRAM POST ULTRASOUND BIOPSY COMPARISON:  Previous exam(s). FINDINGS: Mammographic images were obtained following ultrasound guided biopsy of a palpable right breast mass at 12:30 o'clock, 5 cm the nipple, a small, 8 mm, right breast mass at 12:30 o'clock, 1 cm from the nipple, and a borderline abnormal right axillary lymph node. The ribbon shaped biopsy clip lies within the palpable mass at 12:30 o'clock, 5 cm the nipple. The coil shaped biopsy clip lies in the expected location of the smaller mass at 12:30 o'clock, 1 cm from the nipple. The Q shaped biopsy clip placed within the axillary lymph node cannot be visualized mammographically. IMPRESSION: Appropriate positioning of the ribbon and coil shaped biopsy marking clips at the site of biopsy in the upper inner right breast. The Q shaped biopsy clip within the right axillary lymph node cannot be visualized mammographically. Final Assessment: Post Procedure Mammograms for Marker Placement Electronically Signed   By: Lajean Manes M.D.   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 This was found to be concordant by Dr. Lajean Manes. Pathology revealed FIBROCYSTIC CHANGE of the RIGHT breast, 12:30 o'clock, 1 cmfn. This was found to be concordant by Dr. Lajean Manes. Pathology revealed BENIGN LYMPH NODE of the RIGHT axilla. This was found to be concordant by Dr. Lajean Manes. Pathology results were discussed with the patient by telephone. The patient  reported doing well after the biopsies with tenderness at the sites. Post biopsy instructions and care were reviewed and questions were answered. The patient was encouraged to call [facility] for any additional concerns. The patient was referred to [facility] on September 01, 2020. Breast MRI recommended for extent. Pathology results reported by Stacie Acres RN on 08/24/2020. Electronically Signed   By: Lajean Manes M.D.   On: 08/24/2020 14:44   Result Date: 08/24/2020 CLINICAL DATA:  Patient presents for ultrasound-guided core needle biopsy of a highly suspicious, palpable mass in the right breast at 12:30 o'clock, 5 cm the nipple, a second, indeterminate, 8 mm right breast mass at 12:30 o'clock, 1 cm from the nipple, and a right axillary lymph node with a borderline thickened cortex to 3 mm. EXAM: ULTRASOUND GUIDED RIGHT BREAST CORE NEEDLE BIOPSY ULTRASOUND GUIDED RIGHT BREAST CORE NEEDLE BIOPSY ULTRASOUND GUIDED RIGHT AXILLA CORE NEEDLE BIOPSY COMPARISON:  Previous exam(s). PROCEDURE: I met with the patient and we discussed the procedure of ultrasound-guided biopsy, including benefits and alternatives. We discussed the high likelihood of a successful procedure. We discussed the risks of the procedure, including infection, bleeding, tissue injury, clip migration, and inadequate sampling. Informed written consent was given. The usual time-out protocol was performed immediately prior to the procedure. Lesion #1: 12:30 o'clock, 5 cmfn.  2.7 cm mass. Lesion quadrant: Upper inner quadrant Using sterile technique and 1% Lidocaine as local anesthetic, under direct ultrasound visualization, a 12 gauge spring-loaded device was used to perform biopsy of the 2.7 cm mass using an inferior approach. At the conclusion of the procedure ribbon shaped tissue marker clip was deployed into the biopsy cavity. Lesion #2: 12:30  o'clock, 1 cmfn.  8 mm mass. Lesion quadrant: Upper  inner quadrant Using sterile technique and 1% Lidocaine as local anesthetic, under direct ultrasound visualization, a 12 gauge spring-loaded device was used to perform biopsy of the 8 mm mass using a medial approach. At the conclusion of the procedure coil shaped tissue marker clip was deployed into the biopsy cavity. Lesion #3: Right axillary lymph node. Lesion location: Right axilla. Using sterile technique and 1% Lidocaine as local anesthetic, under direct ultrasound visualization, a 14 gauge spring-loaded device was used to perform biopsy of right axillary lymph node with borderline thickened cortex using a inferolateral approach. At the conclusion of the procedure Q shaped tissue marker clip was deployed into the biopsy cavity. Follow up 2 view mammogram was performed and dictated separately. IMPRESSION: Ultrasound guided biopsy of 2 right breast masses and a borderline right axillary lymph node. No apparent complications. Electronically Signed: By: Lajean Manes M.D. 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 This was found to be concordant by Dr. Lajean Manes. Pathology revealed FIBROCYSTIC CHANGE of the RIGHT breast, 12:30 o'clock, 1 cmfn. This was found to be concordant by Dr. Lajean Manes. Pathology revealed BENIGN LYMPH NODE of the RIGHT axilla. This was found to be concordant by Dr. Lajean Manes. Pathology results were discussed with the patient by telephone. The patient reported doing well after the biopsies with tenderness at the sites. Post biopsy instructions and care were reviewed and questions were answered. The patient was encouraged to call [facility] for any additional concerns. The patient was referred  to [facility] at [facility] on September 01, 2020. Breast MRI recommended for extent. Pathology results reported by Stacie Acres RN on 08/24/2020. Electronically Signed   By: Lajean Manes M.D.   On: 08/24/2020 14:44   Result Date: 08/24/2020 CLINICAL DATA:  Patient presents for ultrasound-guided core needle biopsy of a highly suspicious, palpable mass in the right breast at 12:30 o'clock, 5 cm the nipple, a second, indeterminate, 8 mm right breast mass at 12:30 o'clock, 1 cm from the nipple, and a right axillary lymph node with a borderline thickened cortex to 3 mm. EXAM: ULTRASOUND GUIDED RIGHT BREAST CORE NEEDLE BIOPSY ULTRASOUND GUIDED RIGHT BREAST CORE NEEDLE BIOPSY ULTRASOUND GUIDED RIGHT AXILLA CORE NEEDLE BIOPSY COMPARISON:  Previous exam(s). PROCEDURE: I met with the patient and we discussed the procedure of ultrasound-guided biopsy, including benefits and alternatives. We discussed the high likelihood of a successful procedure. We discussed the risks of the procedure, including infection, bleeding, tissue injury, clip migration, and inadequate sampling. Informed written consent was given. The usual time-out protocol was performed immediately prior to the procedure. Lesion #1: 12:30 o'clock, 5 cmfn.  2.7 cm mass. Lesion quadrant: Upper inner quadrant Using sterile technique and 1% Lidocaine as local anesthetic, under direct ultrasound visualization, a 12 gauge spring-loaded device was used to perform biopsy of the 2.7 cm mass using an inferior approach. At the conclusion of the procedure ribbon shaped tissue marker clip was deployed into the biopsy cavity. Lesion #2: 12:30 o'clock, 1 cmfn.  8 mm mass. Lesion quadrant: Upper inner quadrant Using sterile technique and 1% Lidocaine as local anesthetic, under direct ultrasound visualization, a 12 gauge spring-loaded device was used to perform biopsy of the 8 mm  mass using a medial approach. At the  conclusion of the procedure coil shaped tissue marker clip was deployed into the biopsy cavity. Lesion #3: Right axillary lymph node. Lesion location: Right axilla. Using sterile technique and 1% Lidocaine as local anesthetic, under direct ultrasound visualization, a 14 gauge spring-loaded device was used to perform biopsy of right axillary lymph node with borderline thickened cortex using a inferolateral approach. At the conclusion of the procedure Q shaped tissue marker clip was deployed into the biopsy cavity. Follow up 2 view mammogram was performed and dictated separately. IMPRESSION: Ultrasound guided biopsy of 2 right breast masses and a borderline right axillary lymph node. No apparent complications. Electronically Signed: By: Lajean Manes M.D. On: 08/23/2020 09:45      IMPRESSION: Stage T2, N0, Mx, Right Breast UIQ, Invasive Ductal Carcinoma, ER+ / PR- / Her2+, Grade 3  Assuming that the patient has good tumor shrinkage, she will be a good candidate for breast conservation followed by adjuvant radiation therapy directed at the right breast. We discussed the general course of radiation, potential side effects, and toxicities with radiation and the patient is interested in this approach.   PLAN:  1. Genetic testing 2. MRI 3. Echocardiogram 4. Chemotherapy class 5. Neoadjuvant chemotherapy/port 6. Right lumpectomy with sentinel lymph node biopsy 7. Adjuvant radiation therapy 8. Aromatase inhibitor   ------------------------------------------------  Blair Promise, PhD, MD  This document serves as a record of services personally performed by Gery Pray, MD. It was created on his behalf by Clerance Lav, a trained medical scribe. The creation of this record is based on the scribe's personal observations and the provider's statements to them. This document has been checked and approved by the attending provider.

## 2020-08-31 NOTE — Progress Notes (Signed)
[place] NOTE  Patient Care Team: Girtha Rm, NP-C as PCP - General (Family Medicine) Sueanne Margarita, MD as PCP - Cardiology (Cardiology) Rockwell Germany, RN as Oncology Nurse Navigator [name], RN as Oncology Nurse Navigator [name], MD as Consulting Physician (General Surgery) Nicholas Lose, MD as Consulting Physician (Hematology and Oncology) Gery Pray, MD as Consulting Physician (Radiation Oncology)  CHIEF COMPLAINTS/PURPOSE OF CONSULTATION:  Newly diagnosed breast cancer  HISTORY OF PRESENTING ILLNESS:  Amanda Rich 54 y.o. female is here because of recent diagnosis of invasive ductal carcinoma of the right breast. Patient palpated a right breast lump for one year and a left breast lump for 2 years. Diagnostic mammogram and [place] on 08/17/20 showed in the right breast, a 2.7cm mass 5cm from the nipple and 0.8cm mass 1cm from the nipple at the 12:30 position, and borderline cortical thickening in the right axilla, and in the left breast, a 4.0cm mass at the 11 o'clock position representing a hamartoma. Biopsy of the right breast on 08/23/20 showed invasive ductal carcinoma at the 12:30 position, HER-2 equivocal by IHC, positive by FISH, ER+ 30% weak, PR- 0%, Ki67 50%, and benign findings 1cm from the nipple and in the axilla. She presents to the clinic today for initial evaluation and discussion of treatment options.   I reviewed her records extensively and collaborated the history with the patient.  SUMMARY OF ONCOLOGIC HISTORY: Oncology History  Malignant neoplasm of 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, estrogen receptor positive ([place])  08/25/2020 Initial Diagnosis   Patient palpated a right breast lump x67yrand a left breast lump x290yrDiagnostic mammogram and [place]ore[place]owed in the right breast, a 2.7cm mass 5cm from the nipple and 0.8cm mass 1cm from the nipple at the 12:30 position, with borderline cortical thickening in the  right axilla, and in the left breast, a 4.0cm mass at the 11 o'clock position representing a hamartoma. 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      MEDICAL HISTORY:  Past Medical History:  Diagnosis Date  . Anemia   . Depression   . Smoker   . Umbilical hernia     SURGICAL HISTORY: Past Surgical History:  Procedure Laterality Date  . ABLATION ON ENDOMETRIOSIS    . GANGLION CYST EXCISION  2007   L wrist; [facility], [place]  . TUBAL LIGATION     [facility]  . [place]  SOCIAL HISTORY: Social History   Socioeconomic History  . Marital status: Legally Separated    Spouse name: Not on file  . Number of children: 3  . Years of education: Not on file  . Highest education level: Associate degree: academic program  Occupational History  . Not on file  Tobacco Use  . Smoking status: Current Some Day Smoker    Packs/day: 1.00    Years: 25.00    Pack years: 25.00    Types: Cigarettes  . Smokeless tobacco: Never Used  . Tobacco comment: smoking cigarettes again and not e-cigs  Vaping Use  . Vaping Use: Never used  Substance and Sexual Activity  . Alcohol use: Yes    Alcohol/week: 1.0 standard drink    Types: 1 Glasses of wine per week  . Drug use: No  .  Sexual activity: Yes    Birth control/protection: Post-menopausal  Other Topics Concern  . Not on file  Social History Narrative  . Not on file   Social Determinants of Health   Financial Resource Strain: High Risk  . Difficulty of Paying Living Expenses: Very hard  Food Insecurity: Food Insecurity Present  . Worried About Charity fundraiser in the Last Year: Sometimes true  . Ran Out of Food in the Last Year: Never true  Transportation Needs: No Transportation Needs  . Lack of Transportation (Medical): No  . Lack of Transportation (Non-Medical): No   Physical Activity:   . Days of Exercise per Week: Not on file  . Minutes of Exercise per Session: Not on file  Stress:   . Feeling of Stress : Not on file  Social Connections:   . Frequency of Communication with Friends and Family: Not on file  . Frequency of Social Gatherings with Friends and Family: Not on file  . Attends Religious Services: Not on file  . Active Member of Clubs or Organizations: Not on file  . Attends Archivist Meetings: Not on file  . Marital Status: Not on file  Intimate Partner Violence:   . Fear of Current or Ex-Partner: Not on file  . Emotionally Abused: Not on file  . Physically Abused: Not on file  . Sexually Abused: Not on file    FAMILY HISTORY: Family History  Problem Relation Age of Onset  . Diabetes Mother   . Hypertension Mother   . Liver cancer Daughter   . Diabetes Maternal Aunt   . Breast cancer Maternal Aunt   . Colon cancer Maternal Uncle   . Colon cancer Cousin   . Breast cancer Cousin     ALLERGIES:  is allergic to clarithromycin.  MEDICATIONS:  Current Outpatient Medications  Medication Sig Dispense Refill  . albuterol (PROVENTIL HFA;VENTOLIN HFA) 108 (90 Base) MCG/ACT inhaler Inhale 2 puffs into the lungs every 6 (six) hours as needed for wheezing or shortness of breath. 1 Inhaler 0  . Biotin 5000 MCG CAPS Take 1 capsule by mouth daily.     . Cholecalciferol (VITAMIN D3) 2000 units TABS Take 1 tablet by mouth daily.     . citalopram (CELEXA) 20 MG tablet Take 1 tablet (20 mg total) by mouth daily. (Patient taking differently: Take 10 mg by mouth daily. ) 30 tablet 2  . Colchicine (MITIGARE) 0.6 MG CAPS Take 0.6 mg by mouth daily. 30 capsule 2  . Multiple Vitamin (MULTIVITAMIN WITH MINERALS) TABS Take 1 tablet by mouth daily.    Marland Kitchen oseltamivir (TAMIFLU) 75 MG capsule Take 1 capsule (75 mg total) by mouth 2 (two) times daily. 10 capsule 0  . Prenatal Vit-DSS-Fe Cbn-FA (PRENATAL ADVANTAGE PO) Take by mouth.    . TURMERIC  PO Take 1 tablet by mouth daily.      No current facility-administered medications for this visit.    REVIEW OF SYSTEMS:    All other systems were reviewed with the patient and are negative.  PHYSICAL EXAMINATION: ECOG PERFORMANCE STATUS: 1 - Symptomatic but completely ambulatory  Vitals:   09/01/20 0904  BP: 132/81  Pulse: 76  Resp: 20  Temp: (!) 97.2 F (36.2 C)  SpO2: 100%   Filed Weights   09/01/20 0904  Weight: 116 lb (52.6 kg)       LABORATORY DATA:  I have reviewed the data as listed Lab Results  Component Value Date   WBC 5.2  09/01/2020   HGB 13.0 09/01/2020   HCT 39.7 09/01/2020   MCV 86.5 09/01/2020   PLT 231 09/01/2020   Lab Results  Component Value Date   NA 143 09/01/2020   K 3.3 (L) 09/01/2020   CL 107 09/01/2020   CO2 27 09/01/2020    RADIOGRAPHIC STUDIES: I have personally reviewed the radiological reports and agreed with the findings in the report.  ASSESSMENT AND PLAN:  Malignant neoplasm of 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, estrogen receptor positive ([place]) Patient palpated a right breast lump x32yrand a left breast lump x211yrDiagnostic mammogram and US[place]owed in the right breast, a 2.7cm mass 5cm from the nipple and 0.8cm mass 1cm from the nipple at the 12:30 position, with borderline cortical thickening in the right axilla, and in the left breast, a 4.0cm mass at the 11 o'clock position representing a hamartoma. 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  Pathology and radiology counseling: Discussed with the patient, the details of pathology including the type of breast cancer,the clinical staging, the significance of ER, PR and HER-2/neu receptors and the implications for treatment. After reviewing the pathology in detail, we proceeded to discuss the different treatment options between  surgery, radiation, chemotherapy, antiestrogen therapies.  Recommendation based on multidisciplinary tumor board: 1. Neoadjuvant chemotherapy with TCH Perjeta 6 cycles followed by Herceptin maintenance for 1 year 2. Followed by breast conserving surgery if possible with sentinel lymph node study 3. Followed by adjuvant radiation therapy if patient had lumpectomy 4.  Followed by adjuvant antiestrogen therapy  Chemotherapy Counseling: I discussed the risks and benefits of chemotherapy including the risks of nausea/ vomiting, risk of infection from low WBC count, fatigue due to chemo or anemia, bruising or bleeding due to low platelets, mouth sores, loss/ change in taste and decreased appetite. Liver and kidney function will be monitored through out chemotherapy as abnormalities in liver and kidney function may be a side effect of treatment. Cardiac dysfunction due to Herceptin and Perjeta were discussed in detail. Risk of permanent bone marrow dysfunction due to chemo were also discussed.  Plan: 1. Port placement 2. Echocardiogram 3. Chemotherapy class 4. Breast MRI  Return to clinic in 2 weeks to start chemotherapy.     All questions were answered. The patient knows to call the clinic with any problems, questions or concerns.   ViRulon EisenmengerMD, MPH 09/01/2020    I, Molly Dorshimer, am acting as scribe for ViNicholas LoseMD.  I have reviewed the above documentation for accuracy and completeness, and I agree with the above.

## 2020-08-31 NOTE — Telephone Encounter (Signed)
CREATED IN ERROR

## 2020-09-01 ENCOUNTER — Encounter: Payer: Self-pay | Admitting: *Deleted

## 2020-09-01 ENCOUNTER — Inpatient Hospital Stay: Payer: Medicaid Other | Attending: Hematology and Oncology | Admitting: Hematology and Oncology

## 2020-09-01 ENCOUNTER — Inpatient Hospital Stay: Payer: Medicaid Other

## 2020-09-01 ENCOUNTER — Ambulatory Visit (HOSPITAL_BASED_OUTPATIENT_CLINIC_OR_DEPARTMENT_OTHER): Payer: Medicaid Other | Admitting: Genetic Counselor

## 2020-09-01 ENCOUNTER — Other Ambulatory Visit: Payer: Self-pay

## 2020-09-01 ENCOUNTER — Ambulatory Visit: Payer: Medicaid Other | Attending: Surgery | Admitting: Physical Therapy

## 2020-09-01 ENCOUNTER — Ambulatory Visit: Payer: Self-pay | Admitting: Surgery

## 2020-09-01 ENCOUNTER — Other Ambulatory Visit: Payer: Self-pay | Admitting: *Deleted

## 2020-09-01 ENCOUNTER — Encounter: Payer: Self-pay | Admitting: Radiology

## 2020-09-01 ENCOUNTER — Encounter: Payer: Self-pay | Admitting: Physical Therapy

## 2020-09-01 ENCOUNTER — Ambulatory Visit
Admission: RE | Admit: 2020-09-01 | Discharge: 2020-09-01 | Disposition: A | Discharge status: Home / Self Care | Payer: Medicaid Other | Source: Ambulatory Visit | Attending: Radiation Oncology | Admitting: Radiation Oncology

## 2020-09-01 VITALS — BP 132/81 | HR 76 | Temp 97.2°F | Resp 20 | Ht 65.0 in | Wt 116.0 lb

## 2020-09-01 DIAGNOSIS — Z803 Family history of malignant neoplasm of breast: Secondary | ICD-10-CM | POA: Diagnosis not present

## 2020-09-01 DIAGNOSIS — C773 Secondary and unspecified malignant neoplasm of axilla and upper limb lymph nodes: Secondary | ICD-10-CM | POA: Diagnosis not present

## 2020-09-01 DIAGNOSIS — Z170 Estrogen receptor positive status [ER+]: Secondary | ICD-10-CM | POA: Diagnosis not present

## 2020-09-01 DIAGNOSIS — R293 Abnormal posture: Secondary | ICD-10-CM | POA: Insufficient documentation

## 2020-09-01 DIAGNOSIS — C50111 Malignant neoplasm of central portion of right female breast: Secondary | ICD-10-CM | POA: Diagnosis not present

## 2020-09-01 DIAGNOSIS — K521 Toxic gastroenteritis and colitis: Secondary | ICD-10-CM | POA: Diagnosis not present

## 2020-09-01 DIAGNOSIS — F329 Major depressive disorder, single episode, unspecified: Secondary | ICD-10-CM | POA: Diagnosis not present

## 2020-09-01 DIAGNOSIS — C50211 Malignant neoplasm of upper-inner quadrant of right female breast: Secondary | ICD-10-CM

## 2020-09-01 DIAGNOSIS — C50911 Malignant neoplasm of unspecified site of right female breast: Secondary | ICD-10-CM | POA: Diagnosis not present

## 2020-09-01 DIAGNOSIS — T451X5A Adverse effect of antineoplastic and immunosuppressive drugs, initial encounter: Secondary | ICD-10-CM | POA: Insufficient documentation

## 2020-09-01 DIAGNOSIS — Z5189 Encounter for other specified aftercare: Secondary | ICD-10-CM | POA: Insufficient documentation

## 2020-09-01 DIAGNOSIS — Z79899 Other long term (current) drug therapy: Secondary | ICD-10-CM | POA: Insufficient documentation

## 2020-09-01 DIAGNOSIS — Z800 Family history of malignant neoplasm of digestive organs: Secondary | ICD-10-CM | POA: Diagnosis not present

## 2020-09-01 DIAGNOSIS — F17210 Nicotine dependence, cigarettes, uncomplicated: Secondary | ICD-10-CM | POA: Diagnosis not present

## 2020-09-01 LAB — CBC WITH DIFFERENTIAL (CANCER CENTER ONLY)
Abs Immature Granulocytes: 0.01 10*3/uL (ref 0.00–0.07)
Basophils Absolute: 0.1 10*3/uL (ref 0.0–0.1)
Basophils Relative: 1 %
Eosinophils Absolute: 0.2 10*3/uL (ref 0.0–0.5)
Eosinophils Relative: 4 %
HCT: 39.7 % (ref 36.0–46.0)
Hemoglobin: 13 g/dL (ref 12.0–15.0)
Immature Granulocytes: 0 %
Lymphocytes Relative: 46 %
Lymphs Abs: 2.4 10*3/uL (ref 0.7–4.0)
MCH: 28.3 pg (ref 26.0–34.0)
MCHC: 32.7 g/dL (ref 30.0–36.0)
MCV: 86.5 fL (ref 80.0–100.0)
Monocytes Absolute: 0.5 10*3/uL (ref 0.1–1.0)
Monocytes Relative: 9 %
Neutro Abs: 2.1 10*3/uL (ref 1.7–7.7)
Neutrophils Relative %: 40 %
Platelet Count: 231 10*3/uL (ref 150–400)
RBC: 4.59 MIL/uL (ref 3.87–5.11)
RDW: 13.3 % (ref 11.5–15.5)
WBC Count: 5.2 10*3/uL (ref 4.0–10.5)
nRBC: 0 % (ref 0.0–0.2)

## 2020-09-01 LAB — CMP (CANCER CENTER ONLY)
ALT: 13 U/L (ref 0–44)
AST: 17 U/L (ref 15–41)
Albumin: 4 g/dL (ref 3.5–5.0)
Alkaline Phosphatase: 57 U/L (ref 38–126)
Anion gap: 9 (ref 5–15)
BUN: 11 mg/dL (ref 6–20)
CO2: 27 mmol/L (ref 22–32)
Calcium: 9.6 mg/dL (ref 8.9–10.3)
Chloride: 107 mmol/L (ref 98–111)
Creatinine: 0.76 mg/dL (ref 0.44–1.00)
GFR, Est AFR Am: >60 mL/min (ref >60)
GFR, Estimated: >60 mL/min (ref >60)
Glucose, Bld: 69 mg/dL — ABNORMAL LOW (ref 70–99)
Potassium: 3.3 mmol/L — ABNORMAL LOW (ref 3.5–5.1)
Sodium: 143 mmol/L (ref 135–145)
Total Bilirubin: 0.6 mg/dL (ref 0.3–1.2)
Total Protein: 7.2 g/dL (ref 6.5–8.1)

## 2020-09-01 LAB — GENETIC SCREENING ORDER

## 2020-09-01 MED ORDER — ONDANSETRON HCL 8 MG PO TABS: 8.0000 mg | ORAL_TABLET | Freq: Two times a day (BID) | ORAL | 1 refills | Status: DC | PRN

## 2020-09-01 MED ORDER — PROCHLORPERAZINE MALEATE 10 MG PO TABS: 10.0000 mg | ORAL_TABLET | Freq: Four times a day (QID) | ORAL | 1 refills | Status: DC | PRN

## 2020-09-01 MED ORDER — LORAZEPAM 0.5 MG PO TABS: 0.5000 mg | ORAL_TABLET | Freq: Every evening | ORAL | 0 refills | Status: DC | PRN

## 2020-09-01 MED ORDER — DEXAMETHASONE 4 MG PO TABS: 8.0000 mg | ORAL_TABLET | Freq: Every day | ORAL | 0 refills | Status: DC

## 2020-09-01 MED ORDER — LIDOCAINE-PRILOCAINE 2.5-2.5 % EX CREA: TOPICAL_CREAM | CUTANEOUS | 3 refills | Status: DC

## 2020-09-01 NOTE — Progress Notes (Signed)
REFERRING PROVIDER: Nicholas Lose, MD [place],  [place] [id number]  PRIMARY PROVIDER:  Girtha Rm, NP-C  PRIMARY REASON FOR VISIT:  1. Malignant neoplasm of 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, estrogen receptor positive ([place])   2. Family history of breast cancer   3. Family history of liver cancer      I connected with Amanda Rich on 09/01/2020 at 11:20 am EDT by video conference and verified that I am speaking with the correct person using two identifiers.   Patient location: [facility] clinic Provider location: [facility] office  HISTORY OF PRESENT ILLNESS:   Amanda Rich, a 54 y.o. female, was seen for a [facility] cancer genetics consultation at the request of Dr. Lindi Adie due to a personal and family history of cancer.  Amanda Rich presents to clinic today to discuss the possibility of a hereditary predisposition to cancer, genetic testing, and to further clarify her future cancer risks, as well as potential cancer risks for family members.   In 2021, at the age of 77, Amanda Rich was diagnosed with invasive ductal carcinoma, ER+/PR-/Her2+, of the rights breast. The treatment plan includes neoadjuvant chemotherapy, surgery, radiation therapy, and antiestrogen therapy.    CANCER HISTORY:  Oncology History  Malignant neoplasm of 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, estrogen receptor positive ([place])  08/25/2020 Initial Diagnosis   Patient palpated a right breast lump x60yrand a left breast lump x229yrDiagnostic mammogram and USKoreahowed in the right breast, a 2.7cm mass 5cm from the nipple and 0.8cm mass 1cm from the nipple at the 12:30 position, with borderline cortical thickening in the right axilla, and in the left breast, a 4.0cm mass at the 11 o'clock position representing a hamartoma. Right breast biopsy showed IDC at the 12:30 position, HER-2 equivocal by IHC, positive by FISH, ER+ 30% weak, PR- 0%, Ki67 50%, and benign findings 1cm from the  nipple and in the axilla.   09/13/2020 -  Chemotherapy   The patient had dexamethasone (DECADRON) 4 MG tablet, 8 mg, Oral, Daily, 1 of 1 cycle, Start date: 09/01/2020, End date: -- PALONOSETRON HCL INJECTION 0.25 MG/5ML, 0.25 mg, Intravenous,  Once, 0 of 6 cycles pegfilgrastim-jmdb (FULPHILA) injection 6 mg, 6 mg, Subcutaneous,  Once, 0 of 6 cycles CARBOplatin (PARAPLATIN) in sodium chloride 0.9 % 100 mL chemo infusion, , Intravenous,  Once, 0 of 6 cycles DOCEtaxel (TAXOTERE) 120 mg in sodium chloride 0.9 % 250 mL chemo infusion, 75 mg/m2, Intravenous,  Once, 0 of 6 cycles FOSAPREPITANT IV INFUSION 150 MG, 150 mg, Intravenous,  Once, 0 of 6 cycles pertuzumab (PERJETA) 840 mg in sodium chloride 0.9 % 250 mL chemo infusion, 840 mg, Intravenous, Once, 0 of 6 cycles trastuzumab-dkst (OGIVRI) 420 mg in sodium chloride 0.9 % 250 mL chemo infusion, 8 mg/kg, Intravenous,  Once, 0 of 6 cycles  for chemotherapy treatment.      RISK FACTORS:  Ovaries intact: yes.  Hysterectomy: no.  Menopausal status: perimenopausal.  HRT use: 0 years. Mammogram within the last year: yes. Any excessive radiation exposure in the past: no   Past Medical History:  Diagnosis Date  . Anemia   . Depression   . Family history of breast cancer   . Family history of liver cancer   . Smoker   . Umbilical hernia     Past Surgical History:  Procedure Laterality Date  . ABLATION ON ENDOMETRIOSIS    . GANGLION CYST EXCISION  2007   L  wrist; [facility], [place]  . TUBAL LIGATION     [facility]  . UMBILICAL HERNIA REPAIR  2005   [place]    Social History   Socioeconomic History  . Marital status: Legally Separated    Spouse name: Not on file  . Number of children: 3  . Years of education: Not on file  . Highest education level: Associate degree: academic program  Occupational History  . Not on file  Tobacco Use  . Smoking status: Current Some Day Smoker    Packs/day: 1.00    Years: 25.00    Pack years: 25.00    Types:  Cigarettes  . Smokeless tobacco: Never Used  . Tobacco comment: smoking cigarettes again and not e-cigs  Vaping Use  . Vaping Use: Never used  Substance and Sexual Activity  . Alcohol use: Yes    Alcohol/week: 1.0 standard drink    Types: 1 Glasses of wine per week  . Drug use: No  . Sexual activity: Yes    Birth control/protection: Post-menopausal  Other Topics Concern  . Not on file  Social History Narrative  . Not on file   Social Determinants of Health   Financial Resource Strain: High Risk  . Difficulty of Paying Living Expenses: Very hard  Food Insecurity: Food Insecurity Present  . Worried About Charity fundraiser in the Last Year: Sometimes true  . Ran Out of Food in the Last Year: Never true  Transportation Needs: No Transportation Needs  . Lack of Transportation (Medical): No  . Lack of Transportation (Non-Medical): No  Physical Activity:   . Days of Exercise per Week: Not on file  . Minutes of Exercise per Session: Not on file  Stress:   . Feeling of Stress : Not on file  Social Connections:   . Frequency of Communication with Friends and Family: Not on file  . Frequency of Social Gatherings with Friends and Family: Not on file  . Attends Religious Services: Not on file  . Active Member of Clubs or Organizations: Not on file  . Attends Archivist Meetings: Not on file  . Marital Status: Not on file     FAMILY HISTORY:  We obtained a detailed, 4-generation family history.  Significant diagnoses are listed below: Family History  Problem Relation Age of Onset  . Diabetes Mother   . Hypertension Mother   . Liver cancer Daughter 65  . Diabetes Maternal Aunt   . Breast cancer Maternal Aunt 69  . Colon cancer Maternal Uncle 66       or prostate cancer  . Colon cancer Cousin   . Breast cancer Cousin   . Autism Half-Brother   . Breast cancer Other 1       maternal second cousin  . Diabetes Paternal Uncle   . Breast cancer Other        dx. 22s,  paternal second cousin  . Lung cancer Other        dx. 90s, paternal cousin once removed (father's cousin)   Amanda Rich has three daughters (ages 75-34). Her oldest daughter had a history of liver cancer diagnosed when she was 47. Amanda Rich has two maternal half-sisters in their 80s, and a paternal half-brother and paternal half-sister, in their 87s and 6s.  Amanda Rich mother is 27 and has not had cancer. Amanda Rich had 63 maternal aunts and uncles. One aunt had breast cancer (diagnosed age 63). One uncle had either colon or prostate cancer (diagnosed age  80). Her maternal grandfather died in his 63s from diabetes, and her maternal grandmother died at the age of 73. She notes a maternal second cousin who was recently diagnosed with breast cancer at the age of 29.   Amanda Rich father died at the age of 6 and did not have cancer. She has two paternal aunts and three paternal uncles. Her paternal grandmother died at the age of 55, and her paternal grandfather died at the age of 109. She notes that her father had a cousin who had lung cancer in his 102s. She also has a paternal second cousin who was diagnosed with breast cancer in her 18s.   Amanda Rich is unaware of previous family history of genetic testing for hereditary cancer risks. Patient's ancestors are of unknown, possibly [place] descent. There is no reported Ashkenazi Jewish ancestry. There is no known consanguinity.  GENETIC COUNSELING ASSESSMENT: Amanda Rich is a 54 y.o. female with a personal and family history of breast cancer which is somewhat suggestive of a hereditary cancer syndrome and predisposition to cancer. We, therefore, discussed and recommended the following at today's visit.   DISCUSSION: We discussed that approximately 5-10% of breast cancer is hereditary, with most cases associated with the BRCA1 and BRCA2 genes. There are other genes that can be associated with hereditary breast cancer syndromes. These include ATM,  CHEK2, PALB2, etc. We discussed that testing is beneficial for several reasons, including knowing about other cancer risks, identifying potential screening and risk-reduction options that may be appropriate, and to understand if other family members could be at risk for cancer and allow them to undergo genetic testing.   We reviewed the characteristics, features and inheritance patterns of hereditary cancer syndromes. We also discussed genetic testing, including the appropriate family members to test, the process of testing, insurance coverage and turn-around-time for results. We discussed the implications of a negative, positive and/or variant of uncertain significant result. We recommended Amanda Rich pursue genetic testing for the [facility] Common Hereditary Cancers panel.   The Common Hereditary Cancers Panel offered by [facility] includes sequencing and/or deletion duplication testing of the following 48 genes: APC, ATM, AXIN2, BARD1, BMPR1A, BRCA1, BRCA2, BRIP1, CDH1, CDK4, CDKN2A (p14ARF), CDKN2A (p16INK4a), CHEK2, CTNNA1, DICER1, EPCAM (Deletion/duplication testing only), GREM1 (promoter region deletion/duplication testing only), KIT, MEN1, MLH1, MSH2, MSH3, MSH6, MUTYH, NBN, NF1, NTHL1, PALB2, PDGFRA, PMS2, POLD1, POLE, PTEN, RAD50, RAD51C, RAD51D, RNF43, SDHB, SDHC, SDHD, SMAD4, SMARCA4. STK11, TP53, TSC1, TSC2, and VHL.  The following genes are evaluated for sequence changes only: SDHA and HOXB13 c.251G>A variant only.  Based on Amanda Rich's personal and family history of cancer, she meets medical criteria for genetic testing. Despite that she meets criteria, she may still have an out of pocket cost. Amanda Rich is in the process of obtaining Medicaid insurance. Since she is still in this process, she will likely need to apply for the [facility] Patient Assistance Program to waive the cost of genetic testing. We will confirm that this is the case before placing the order, and discuss billing options with  Amanda Rich at that time.  PLAN:  Amanda Rich is interested in pursuing genetic testing. We will determine whether Amanda Rich is able to have the genetic test billed through Medicaid at this time, or whether she will need to apply for patient assistance through the genetic testing laboratory, [facility]. We will call Amanda Rich once we have this information to discuss her options before placing the order for genetic testing.  Amanda Rich. Gerlich questions were answered to her satisfaction today. Our contact information was provided should additional questions or concerns arise. Thank you for the referral and allowing [place] to share in the care of your patient.   Clint Guy, [place], [facility] Licensed, Certified Dispensing [email or web address] Phone: [phone]  The patient was seen for a total of 25 minutes in face-to-face genetic counseling.  This patient was discussed with Drs. Magrinat, Lindi Adie and/or Burr Medico who agrees with the above.    _______________________________________________________________________ For Office Staff:  Number of people involved in session: 1 Was an Intern/ student involved with case: no

## 2020-09-01 NOTE — Assessment & Plan Note (Signed)
Patient palpated a right breast lump x57yrand a left breast lump x263yrDiagnostic mammogram and USKoreahowed in the right breast, a 2.7cm mass 5cm from the nipple and 0.8cm mass 1cm from the nipple at the 12:30 position, with borderline cortical thickening in the right axilla, and in the left breast, a 4.0cm mass at the 11 o'clock position representing a hamartoma. 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  Pathology and radiology counseling: Discussed with the patient, the details of pathology including the type of breast cancer,the clinical staging, the significance of ER, PR and HER-2/neu receptors and the implications for treatment. After reviewing the pathology in detail, we proceeded to discuss the different treatment options between surgery, radiation, chemotherapy, antiestrogen therapies.  Recommendation based on multidisciplinary tumor board: 1. Neoadjuvant chemotherapy with TCH Perjeta 6 cycles followed by Herceptin maintenance for 1 year 2. Followed by breast conserving surgery if possible with sentinel lymph node study 3. Followed by adjuvant radiation therapy if patient had lumpectomy 4.  Followed by adjuvant antiestrogen therapy  Chemotherapy Counseling: I discussed the risks and benefits of chemotherapy including the risks of nausea/ vomiting, risk of infection from low WBC count, fatigue due to chemo or anemia, bruising or bleeding due to low platelets, mouth sores, loss/ change in taste and decreased appetite. Liver and kidney function will be monitored through out chemotherapy as abnormalities in liver and kidney function may be a side effect of treatment. Cardiac dysfunction due to Herceptin and Perjeta were discussed in detail. Risk of permanent bone marrow dysfunction due to chemo were also discussed.  Plan: 1. Port placement 2.  Echocardiogram 3. Chemotherapy class 4. Breast MRI  Return to clinic in 2 weeks to start chemotherapy.

## 2020-09-01 NOTE — Patient Instructions (Signed)

## 2020-09-01 NOTE — Therapy (Signed)
[place], [place], [address] Phone: [phone]   Fax:  [phone]  Physical Therapy Evaluation  Patient Details  Name: Amanda Rich MRN: [id number] Date of Birth: [birth year] Referring Provider (PT): Dr. [name]   Encounter Date: 09/01/2020   PT End of Session - 09/01/20 1213    Visit Number 1    Number of Visits 2    Date for PT Re-Evaluation 03/01/21    PT Start Time 0956    PT Stop Time 1010   Also saw pt from 1100-1120 for a total of 34 minutes   PT Time Calculation (min) 14 min    Activity Tolerance Patient tolerated treatment well    Behavior During Therapy [facility] for tasks assessed/performed           Past Medical History:  Diagnosis Date  . Anemia   . Depression   . Smoker   . Umbilical hernia     Past Surgical History:  Procedure Laterality Date  . ABLATION ON ENDOMETRIOSIS    . GANGLION CYST EXCISION  2007   L wrist; [facility], [place]  . TUBAL LIGATION     [facility]  . UMBILICAL HERNIA REPAIR  2005   [place]    There were no vitals filed for this visit.    Subjective Assessment - 09/01/20 1202    Subjective Patient reports she is here today to be seen by her medical team for her newly diagnosed right breast cancer.    Patient is accompained by: Family member    Pertinent History Patient was diagnosed on 08/17/2020 with right grade III invasive ductal carcinoma breast cancer. It measures 2.7 cm and is located in the upper inner quadrant. It is ER positive, PR negative, andHER2 positive with a Ki67 of 30%. She has a history of a heart attack in 12/2018.    Patient Stated Goals Reduce lymphedema risk and learn post op shoulder ROM HEP    Currently in Pain? No/denies              [facility] PT Assessment - 09/01/20 0001      Assessment   Medical Diagnosis Right breast cancer    Referring Provider (PT) Dr. [name] [place]    Onset Date/Surgical Date 08/17/20    Hand Dominance  Right    Prior Therapy none      Precautions   Precautions Other (comment)    Precaution Comments active cancer      Restrictions   Weight Bearing Restrictions No      Balance Screen   Has the patient fallen in the past 6 months No    Has the patient had a decrease in activity level because of a fear of falling?  No    Is the patient reluctant to leave their home because of a fear of falling?  No      Home Environment   Living Environment Private residence    Living Arrangements Alone    Available Help at Discharge Family      Prior Function   Level of Independence Independent    Vocation Unemployed    Vocation Requirements She was about to begin a new job when she was diagnosed; work is on hold for now.    Leisure She is active gardening, walking trails, and playing with grandkids but does not necessarily exercise other than being very active.      Cognition   Overall Cognitive Status Within Functional Limits for  tasks assessed      Posture/Postural Control   Posture/Postural Control Postural limitations    Postural Limitations Rounded Shoulders;Forward head      ROM / Strength   AROM / PROM / Strength AROM;Strength      AROM   Overall AROM Comments Cervical active range of motoin is WNL    AROM Assessment Site Shoulder    Right/Left Shoulder Right;Left    Right Shoulder Extension 51 Degrees    Right Shoulder Flexion 160 Degrees    Right Shoulder ABduction 169 Degrees    Right Shoulder Internal Rotation 70 Degrees    Right Shoulder External Rotation 89 Degrees    Left Shoulder Extension 65 Degrees    Left Shoulder Flexion 163 Degrees    Left Shoulder ABduction 177 Degrees    Left Shoulder Internal Rotation 65 Degrees    Left Shoulder External Rotation 90 Degrees      Strength   Overall Strength Within functional limits for tasks performed             LYMPHEDEMA/ONCOLOGY QUESTIONNAIRE - 09/01/20 0001      Type   Cancer Type Right breast cancer       Lymphedema Assessments   Lymphedema Assessments Upper extremities      Right Upper Extremity Lymphedema   10 cm Proximal to Olecranon Process 20.8 cm    Olecranon Process 20.9 cm    10 cm Proximal to Ulnar Styloid Process 18.8 cm    Just Proximal to Ulnar Styloid Process 14.2 cm    Across Hand at [employer] 18.8 cm    At [place] of 2nd Digit 6.1 cm      Left Upper Extremity Lymphedema   10 cm Proximal to Olecranon Process 20.7 cm    Olecranon Process 21.4 cm    10 cm Proximal to Ulnar Styloid Process 18.4 cm    Just Proximal to Ulnar Styloid Process 14.2 cm    Across Hand at [employer] 18.7 cm    At [place] of 2nd Digit 5.9 cm           L-DEX FLOWSHEETS - 09/01/20 1200      L-DEX LYMPHEDEMA SCREENING   Measurement Type Unilateral    L-DEX MEASUREMENT EXTREMITY Upper Extremity    POSITION  Standing    DOMINANT SIDE Right    At Risk Side Right    BASELINE SCORE (UNILATERAL) -2.7           The patient was assessed using the L-Dex machine today to produce a lymphedema index baseline score. The patient will be reassessed on a regular basis (typically every 3 months) to obtain new L-Dex scores. If the score is > 6.5 points away from his/her baseline score indicating onset of subclinical lymphedema, it will be recommended to wear a compression garment for 4 weeks, 12 hours per day and then be reassessed. If the score continues to be > 6.5 points from baseline at reassessment, we will initiate lymphedema treatment. Assessing in this manner has a 95% rate of preventing clinically significant lymphedema.      Amanda Rich - 09/01/20 0001    Open a tight or new jar No difficulty    Do heavy household chores (wash walls, wash floors) No difficulty    Carry a shopping bag or briefcase No difficulty    Wash your back No difficulty    Use a knife to cut food No difficulty    Recreational activities in which you  take some force or impact through your arm, shoulder, or hand (golf,  hammering, tennis) Mild difficulty    During the past week, to what extent has your arm, shoulder or hand problem interfered with your normal social activities with family, friends, neighbors, or groups? Slightly    During the past week, to what extent has your arm, shoulder or hand problem limited your work or other regular daily activities Slightly    Arm, shoulder, or hand pain. None    Tingling (pins and needles) in your arm, shoulder, or hand None    Difficulty Sleeping Mild difficulty    DASH Score 9.09 %            Objective measurements completed on examination: See above findings.            Patient was instructed today in a home exercise program today for post op shoulder range of motion. These included active assist shoulder flexion in sitting, scapular retraction, wall walking with shoulder abduction, and hands behind head external rotation.  She was encouraged to do these twice a day, holding 3 seconds and repeating 5 times when permitted by her physician.       PT Education - 09/01/20 1212    Education Details Lymphedema risk reduction and post op shoulder ROM HEP    Person(s) Educated Patient   sister   Methods Explanation;Demonstration;Handout    Comprehension Returned demonstration;Verbalized understanding               PT Long Term Goals - 09/01/20 1219      PT LONG TERM GOAL #1   Title Patient will demonstrate she has regained full shoulder ROM and function post operatively compared to baselines.    Time 6    Period Months    Status New    Target Date 03/01/21           Breast Clinic Goals - 09/01/20 1218      Patient will be able to verbalize understanding of pertinent lymphedema risk reduction practices relevant to her diagnosis specifically related to skin care.   Time 1    Period Days    Status Achieved      Patient will be able to return demonstrate and/or verbalize understanding of the post-op home exercise program related to  regaining shoulder range of motion.   Time 1    Status Achieved      Patient will be able to verbalize understanding of the importance of attending the postoperative After Breast Cancer Class for further lymphedema risk reduction education and therapeutic exercise.   Time 1    Period Days    Status Achieved                 Plan - 09/01/20 1214    Clinical Impression Statement Patient was diagnosed on 08/17/2020 with right grade III invasive ductal carcinoma breast cancer. It measures 2.7 cm and is located in the upper inner quadrant. It is ER positive, PR negative, andHER2 positive with a Ki67 of 30%. She has a history of a heart attack in 12/2018. Her multidisciplinary medical team met prior to her assessments to determine a recommended treatment plan. She is planning to have neoadjuvant chemotherapy followed by a right lumpectomy and sentinel node biopsy, radiation, and anti-estrogen therapy. She will benefit from a post op PT reassessment to determine needs and from L-Dex screenings every 3 months to detect subclinical lymphedema.    Stability/Clinical Decision Making Stable/Uncomplicated  Clinical Decision Making Low    Rehab Potential Excellent    PT Frequency --   Eval and 1 f/u visit   PT Treatment/Interventions ADLs/Self Care Home Management;Therapeutic exercise;Patient/family education    PT Next Visit Plan Will reassess 3-4 weeks post op to determine needs    PT Home Exercise Plan Post op shoulder ROM HEP    Consulted and Agree with Plan of Care Patient;Family member/caregiver    Family Member Consulted sister           Patient will benefit from skilled therapeutic intervention in order to improve the following deficits and impairments:  Postural dysfunction, Decreased range of motion, Decreased knowledge of precautions, Impaired UE functional use, Pain  Visit Diagnosis: Malignant neoplasm of 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, estrogen receptor positive  ([place]) - Plan: PT plan of care cert/re-cert  Abnormal posture - Plan: PT plan of care cert/re-cert   Patient will follow up at outpatient cancer rehab 3-4 weeks following surgery.  If the patient requires physical therapy at that time, a specific plan will be dictated and sent to the referring physician for approval. The patient was educated today on appropriate basic range of motion exercises to begin post operatively and the importance of attending the After Breast Cancer class following surgery.  Patient was educated today on lymphedema risk reduction practices as it pertains to recommendations that will benefit the patient immediately following surgery.  She verbalized good understanding.      Problem List Patient Active Problem List   Diagnosis Date Noted  . Malignant neoplasm of 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, estrogen receptor positive ([place]) 08/25/2020  . Abnormal chest CT 01/30/2019  . History of pericarditis 01/30/2019  . Pericarditis 01/22/2019  . Chest pain 12/26/2018  . History of endometrial ablation 01/21/2018  . Family history of colon cancer 09/24/2017  . Poor diet 01/24/2017  . Weight loss, abnormal 01/24/2017  . Smoker 09/22/2016  . Mixed hyperlipidemia 08/07/2016  . Irregular intermenstrual bleeding 08/07/2016  . Depression 08/07/2016  . Fat necrosis of peritoneum ([place]) 08/07/2016  . Pelvic congestion syndrome 08/07/2016   Annia Friendly, PT 09/01/20 12:22 PM  [place], [place], [address] Phone: [phone]   Fax:  [phone]  Name: Amanda Rich MRN: [id number] Date of Birth: 11-May-1966

## 2020-09-01 NOTE — Progress Notes (Signed)
START ON PATHWAY REGIMEN - Breast     A cycle is every 21 days:     Pertuzumab      Pertuzumab      Trastuzumab-xxxx      Trastuzumab-xxxx      Carboplatin      Docetaxel   **Always confirm dose/schedule in your pharmacy ordering system**  Patient Characteristics: Preoperative or Nonsurgical Candidate (Clinical Staging), Neoadjuvant Therapy followed by Surgery, Invasive Disease, Chemotherapy, HER2 Positive, ER Positive Therapeutic Status: Preoperative or Nonsurgical Candidate (Clinical Staging) AJCC M Category: cM0 AJCC Grade: G3 Breast Surgical Plan: Neoadjuvant Therapy followed by Surgery ER Status: Positive (+) AJCC 8 Stage Grouping: IIA HER2 Status: Positive (+) AJCC T Category: cT2 AJCC N Category: cN0 PR Status: Negative (-) Intent of Therapy: Curative Intent, Discussed with Patient 

## 2020-09-01 NOTE — H&P (Signed)
Amanda Rich Appointment: 09/01/2020 9:00 AM Location: [facility] Patient #: [id number] DOB: [birth year] Undefined / Language: Amanda Rich / Race: Black or African American Female  History of Present Illness Amanda Rich [name] MD; 09/01/2020 11:14 AM) Patient words: 54 year old female with an enlarging palpable right breast lump for approximately 1 year and palpable left breast lump for 2 years. Left side bx benign right upper outer left breast IDC grade 3 as below No other history except above     DDITIONAL INFORMATION: 1. FLUORESCENCE IN-SITU HYBRIDIZATION Results: GROUP 1: HER2 **POSITIVE** On the tissue sample received from this individual HER2 FISH was performed by a technologist and cell imaging and analysis on the [employer]. RATIO OF HER2/CEN 17 SIGNALS 3.44 AVERAGE HER2 COPY NUMBER PER CELL 4.65 The ratio of HER2/CEN 17 result exceeds the cutoff value of >=2.0 and a copy number of HER2 signals exceeding the cutoff range of >=4.0 signals per cell. Arch Pathol Lab Med 1:1,2018 Thressa Sheller MD Pathologist, Electronic Signature ( Signed 08/31/2020) 1. PROGNOSTIC INDICATORS Results: IMMUNOHISTOCHEMICAL AND MORPHOMETRIC ANALYSIS PERFORMED MANUALLY The tumor cells are EQUIVOCAL for Her2 (2+). Her2 by FISH will be performed and results reported separately. Estrogen Receptor: 30%, POSITIVE, WEAK STAINING INTENSITY Progesterone Receptor: 0%, NEGATIVE Proliferation Marker Ki67: 50% COMMENT: The negative hormone receptor study(ies) in this case has an internal positive control. REFERENCE RANGE ESTROGEN RECEPTOR 1 of 3 FINAL for Amanda Rich ([id number]) ADDITIONAL INFORMATION:(continued) NEGATIVE 0% POSITIVE =>1% REFERENCE RANGE PROGESTERONE RECEPTOR NEGATIVE 0% POSITIVE =>1% All controls stained appropriately Thressa Sheller MD Pathologist, Electronic Signature ( Signed 08/25/2020) FINAL DIAGNOSIS Diagnosis 1. Breast, right, needle core biopsy, 12:30 o'clock,  5cmfn - INVASIVE DUCTAL CARCINOMA. SEE NOTE 2. Breast, right, needle core biopsy, 12:30 o'clock, 1cmfn - FIBROCYSTIC CHANGE - NEGATIVE FOR CARCINOMA 3. Lymph node, needle/core biopsy, right axilla - BENIGN LYMPH NODE Diagnosis Note 1. Carcinoma measures 1.5 cm in greatest linear dimension and appears grade 3. Dr. Tresa Moore reviewed the case and concurs with the diagnosis. A breast prognostic profile (ER, PR, Ki-67 and HER2) is pending and will be reported in an addendum. The [facility] was notified on 08/24/2020. Amanda Folds MD Pathologist, Electronic Signature (Case signed 08/24/2020) Specimen                  EXAM: DIGITAL DIAGNOSTIC BILATERAL MAMMOGRAM WITH CAD AND TOMO  ULTRASOUND BILATERAL BREAST  COMPARISON: Previous exam(s).  ACR Breast Density Category c: The breast tissue is heterogeneously dense, which may obscure small masses.  FINDINGS: Radiopaque BBs are placed at the site of the patient's bilateral palpable lumps. An irregular, hyperdense mass is seen in the upper inner quadrant at far posterior depth on the right, in correlation with the radiopaque BB. An additional partially circumscribed mass is identified in the central medial aspect at middle depth.  A circumscribed mixed echogenicity, fat containing mass is seen deep to the radiopaque BB in the upper inner quadrant of the left breast. No additional suspicious findings are identified.  Mammographic images were processed with CAD.  Targeted ultrasound is performed, showing an irregular hypoechoic mass at the 12:30 position 5 cm from the nipple. It measures 2.7 x 2.6 x 1.8 cm and demonstrates internal vascularity. An additional oval, circumscribed hypoechoic mass is identified at the 12:30 position 1 cm from the nipple. It measures 8 x 7 x 7 mm. There is no internal vascularity. Evaluation of the right axilla demonstrates borderline cortical thickening up to 0.33  mm.  Evaluation of  the left breast demonstrates a circumscribed mixed echogenicity mass at the 11 o'clock position 5 cm from the nipple measuring up to 4 cm. This corresponds with the mammographic finding and is consistent with a hamartoma.  IMPRESSION: 1. Suspicious right breast mass at the 12:30 position 5 cm from the nipple corresponding with the patient's palpable lump. Recommendation is for ultrasound-guided biopsy. 2. Indeterminate right breast mass at the 12:30 position 1 cm from the nipple. Recommendation is for ultrasound-guided biopsy. 3. Borderline right axillary lymphadenopathy. Recommendation is for ultrasound-guided biopsy. 4. Benign left breast hamartoma corresponding with the patient's palpable lump.  RECOMMENDATION: 1. Three area ultrasound-guided biopsy of the right breast/axilla. 2. Pending above biopsy results, further evaluation with contrast enhanced breast MRI is recommended given the multiplicity of findings and deep location of the patient's palpable mass.  I have discussed the findings and recommendations with the patient. If applicable, a reminder letter will be sent to the patient regarding the next appointment.  BI-RADS CATEGORY 5: Highly suggestive of malignancy.   Electronically Signed By: [name] M.D. On: 08/17/2020 14:33.  The patient is a 54 year old female.   Past Surgical History Conni Slipper, RN; 09/01/2020 8:14 AM) Breast Biopsy Bilateral. Colon Polyp Removal - Colonoscopy Sentinel Lymph Node Biopsy Ventral / Umbilical Hernia Surgery Bilateral.  Diagnostic Studies History Conni Slipper, RN; 09/01/2020 8:14 AM) Colonoscopy 1-5 years ago Mammogram within last year Pap Smear 1-5 years ago  Allergies Conni Slipper, RN; 09/01/2020 8:12 AM) Clarithromycin *CHEMICALS* Swelling.  Medication History Conni Slipper, RN; 09/01/2020 8:14 AM) Albuterol (90MCG/ACT Aerosol Soln, Inhalation) Active. Biotin (5000MCG Tablet, Oral)  Active. Cholecalciferol (50 MCG(2000 UT) Tablet Chewable, Oral) Active. CeleXA (20MG Tablet, Oral) Active. Mitigare (0.6MG Capsule, Oral) Active. Multiple Vitamin (1 (one) Oral) Active. Medications Reconciled  Social History Conni Slipper, RN; 09/01/2020 8:14 AM) Alcohol use Occasional alcohol use. Caffeine use Carbonated beverages, Coffee. No drug use Tobacco use Current every day smoker.  Family History Conni Slipper, RN; 09/01/2020 8:14 AM) Alcohol Abuse Father, Mother. Arthritis Family Members In General. Bleeding disorder Family Members In General. Cancer Son. Colon Cancer Family Members In General. Colon Polyps Family Members In General. Diabetes Mellitus Mother. Hypertension Mother. Ischemic Bowel Disease Mother. Kidney Disease Family Members In General, Mother.  Pregnancy / Birth History Conni Slipper, RN; 09/01/2020 8:14 AM) Age at menarche 73 years. Age of menopause 36-50 Contraceptive History Depo-provera, Oral contraceptives. Gravida 3 Maternal age 44-20 Para 3 Regular periods  Other Problems Conni Slipper, RN; 09/01/2020 8:14 AM) Depression Myocardial infarction     Review of Systems Conni Slipper RN; 09/01/2020 8:14 AM) General Present- Fatigue and Weight Loss. Not Present- Appetite Loss, Chills, Fever, Night Sweats and Weight Gain. Skin Not Present- Change in Wart/Mole, Dryness, Hives, Jaundice, New Lesions, Non-Healing Wounds, Rash and Ulcer. HEENT Present- Wears glasses/contact lenses. Not Present- Earache, Hearing Loss, Hoarseness, Nose Bleed, Oral Ulcers, Ringing in the Ears, Seasonal Allergies, Sinus Pain, Sore Throat, Visual Disturbances and Yellow Eyes. Respiratory Present- Chronic Cough. Not Present- Bloody sputum, Difficulty Breathing, Snoring and Wheezing. Breast Present- Breast Mass. Not Present- Breast Pain, Nipple Discharge and Skin Changes. Cardiovascular Not Present- Chest Pain, Difficulty Breathing Lying Down, Leg Cramps,  Palpitations, Rapid Heart Rate, Shortness of Breath and Swelling of Extremities. Gastrointestinal Not Present- Abdominal Pain, Bloating, Bloody Stool, Change in Bowel Habits, Chronic diarrhea, Constipation, Difficulty Swallowing, Excessive gas, Gets full quickly at meals, Hemorrhoids, Indigestion, Nausea, Rectal Pain and Vomiting. Female Genitourinary Not Present- Frequency, Nocturia, Painful Urination, Pelvic Pain and  Urgency. Musculoskeletal Present- Joint Pain. Not Present- Back Pain, Joint Stiffness, Muscle Pain, Muscle Weakness and Swelling of Extremities. Neurological Not Present- Decreased Memory, Fainting, Headaches, Numbness, Seizures, Tingling, Tremor, Trouble walking and Weakness. Psychiatric Present- Change in Sleep Pattern. Not Present- Anxiety, Bipolar, Depression, Fearful and Frequent crying. Endocrine Present- Cold Intolerance. Not Present- Excessive Hunger, Hair Changes, Heat Intolerance, Hot flashes and New Diabetes. Hematology Not Present- Blood Thinners, Easy Bruising, Excessive bleeding, Gland problems, HIV and Persistent Infections.   Physical Exam (Maclane Holloran A. Glen Kesinger MD; 09/01/2020 11:16 AM)  General Mental Status-Alert. General Appearance-Consistent with stated age. Hydration-Well hydrated. Voice-Normal.  Head and Neck Head-normocephalic, atraumatic with no lesions or palpable masses. Trachea-midline. Thyroid Gland Characteristics - normal size and consistency.  Breast Note: mobile 3 cm right breast mass UOQ not fixed left breast mass 2 cm mobile no nipple discharge noted  Cardiovascular Cardiovascular examination reveals -normal heart sounds, regular rate and rhythm with no murmurs and normal pedal pulses bilaterally.  Abdomen Inspection Inspection of the abdomen reveals - No Hernias. Skin - Scar - no surgical scars. Palpation/Percussion Palpation and Percussion of the abdomen reveal - Soft, Non Tender, No Rebound tenderness, No Rigidity  (guarding) and No hepatosplenomegaly. Auscultation Auscultation of the abdomen reveals - Bowel sounds normal.  Neurologic Neurologic evaluation reveals -alert and oriented x 3 with no impairment of recent or remote memory. Mental Status-Normal.  Neuropsychiatric The patient's mood and affect are described as -normal. Associations-intact.  Musculoskeletal Normal Exam - Left-Upper Extremity Strength Normal and Lower Extremity Strength Normal. Normal Exam - Right-Upper Extremity Strength Normal and Lower Extremity Strength Normal.  Lymphatic Head & Neck  General Head & Neck Lymphatics: Bilateral - Description - Normal. Axillary  General Axillary Region: Bilateral - Description - Normal. Tenderness - Non Tender.    Assessment & Plan (Gredmarie Delange A. Francesco Provencal MD; 09/01/2020 11:18 AM)  BREAST CANCER, STAGE 2, RIGHT (C50.911) Impression: neoadjuvant chemotherapy  port placement  breast conserving surgery  Pt requires port placement for chemotherapy. Risk include bleeding, infection, pneumothorax, hemothorax, mediastinal injury, nerve injury , blood vessel injury, stroke, blood clots, death, migration. embolization and need for additional procedures. Pt agrees to proceed.  Current Plans Use of a central venous catheter for intravenous therapy was discussed. Technique of catheter placement using ultrasound and fluoroscopy guidance was discussed. Risks such as bleeding, infection, pneumothorax, catheter occlusion, reoperation, and other risks were discussed. I noted a good likelihood this will help address the problem. Questions were answered. The patient expressed understanding & wishes to proceed. You are being scheduled for surgery- Our schedulers will call you.  You should hear from our office's scheduling department within 5 working days about the location, date, and time of surgery. We try to make accommodations for patient's preferences in scheduling surgery, but  sometimes the OR schedule or the surgeon's schedule prevents [place] from making those accommodations.  If you have not heard from our office [phone]) in 5 working days, call the office and ask for your surgeon's nurse.  If you have other questions about your diagnosis, plan, or surgery, call the office and ask for your surgeon's nurse.  Pt Education - [facility] Breast Cancer Information Given - [employer] "Breast Journey" Package Pt Education - flb breast cancer surgery: discussed with patient and provided information.

## 2020-09-02 ENCOUNTER — Encounter: Payer: Self-pay | Admitting: *Deleted

## 2020-09-02 ENCOUNTER — Other Ambulatory Visit: Payer: Self-pay | Admitting: *Deleted

## 2020-09-02 ENCOUNTER — Encounter: Payer: Self-pay | Admitting: Hematology and Oncology

## 2020-09-02 ENCOUNTER — Telehealth: Payer: Self-pay | Admitting: Hematology and Oncology

## 2020-09-02 ENCOUNTER — Telehealth: Payer: Self-pay | Admitting: *Deleted

## 2020-09-02 DIAGNOSIS — C50211 Malignant neoplasm of upper-inner quadrant of right female breast: Secondary | ICD-10-CM

## 2020-09-02 DIAGNOSIS — Z170 Estrogen receptor positive status [ER+]: Secondary | ICD-10-CM

## 2020-09-02 NOTE — Research (Signed)
S1714, A PROSPECTIVE OBSERVATIONAL COHORT STUDY TO DEVELOP A PREDICTIVE MODEL OFTAXANE-INDUCED PERIPHERAL NEUROPATHY IN CANCER PATIENTS.  09/01/20   INITIAL VISIT:Met withTynesha who was accompanied by her daughter for 20 minutes in a private exam roomduring breast conference.We reviewedthe [id number] consent (protocol version date6/11/2021[facility] Active Date 06/29/2020)andHIPPAform(dated 02/22/2018)with patient in their entirety. Explained the purpose of the study along with potential risks and benefits of participation. Reviewed the study required assessments and timeline for completing these assessments. Informed patient that participation is completely voluntary and she may withdraw consent at any time.Upon completion ofreview, patient was offered to ask any questions or express any concerns. Vedika did not have any questions or concerns. I gave her and her daughter copies of the consent and HIPPA forms for [phone] as well as DCP to take home for review. Thanked them for their time and I look forward to speaking with her in the near future.  Plan: I will call patient Friday 9/10 to see if she has had a chance to review the documents and to see if she is interested in participation.   Carol Ada, RT (R)(T) Clinical Research Coordinator

## 2020-09-02 NOTE — Telephone Encounter (Signed)
No 9/8 los, no changes made to pt schedule

## 2020-09-02 NOTE — Progress Notes (Unsigned)
Received referral from RN navigator to reach out to patient regarding needs. Advised she is on my list to follow.   Received email from Baxter Flattery in pharmacy to verify if patient has grant funds to cover medication. Advised she does not as of yet but to have patient call me.  Patient called to discuss   Patient was under the assumption that her [facility] Medicaid card would cover her medication cost. Reviewed notes from [place] and it does not appear she has been approved as of yet, but sent staff message to [facility] to clarify.  Discussed the one-time $1000 Advertising account executive and qualifications to assist with personal expenses while going through treatment. Advised what is needed to apply. She will bring on 9/13 which I will meet her after her chemo ed class to go over grant in detail.  Advised patient that the social worker would reach out to her regarding other concerns she has due to losing income while in treatment.   Spoke with Lauren in social work to discuss all avenues of how we can best meet the needs of the patient.  Advised patient I would do some research and call her back.

## 2020-09-02 NOTE — Telephone Encounter (Signed)
Spoke with patient to inform her of the port a cath appointment with IR at [facility] on 9/17 and to NPO after 7am and that she will need a driver.  Also had her prescriptions transferred to [facility] outpatient pharmacy due to patient having [facility].  Patient verbalized understanding.

## 2020-09-02 NOTE — Progress Notes (Signed)
[place] Clinical Social Work INITIAL SDOH Screening Note   Amanda Rich is a 54 y.o. year old female who attended Breast Multidisciplinary Clinic. Patient referral triggered by SDOH screen, referral made to support services team to consult the patient for assistance with Financial Difficulties related to unable to work through treatment.   SDOH (Social Determinants of Health) assessments performed: Yes    Family/Social Information:  . Housing Arrangement: patient lives alone . Financial concerns: Yes  o Are you able to meet your monthly expenses or do you feel financially stressed? Patient shared she is unable to meet monthly expenses since diagnosed with cancer due to inability to work. Patient was working in temp role, was unable to work. Did not have short-term disability or FMLA benefits. o Are you concerned about future financial problems due to your illness or keeping your job and income through treatment? Yes. . Employment: Unemployed. Income source: No income . Medication Concerns: no, medications should be covered by [facility] program  . Concerns about diagnosis and/or treatment: Patient's primary concerns at this time are financial needs. . Patient reported stressors: Housing, Work/ school and Adjusting to my illness . Patient enjoys  CSW did not discuss with patient. Current coping skills/ strengths: Capable of independent living    SUMMARY: Current SDOH Barriers:  . Financial constraints related to inability to work through cancer treatment . Housing barriers . Lack of essential utilities - due to lack of income*  Clinical Social Work Clinical Goal(s):  Over the next course of treatment days, patient will work with SW to address concerns related to financial concerns  Interventions: . Patient interviewed and SDOH assessment performed . Referred to Patient Resource Specialist to discuss [employer] . Collaborated with breast cancer navigator regarding patient's  concerns   Follow Up Plan: SW will follow up with patient by phone over the next several weeks once treatment has established to explore other financial resources. Patient verbalizes understanding of plan: Yes   [facility]

## 2020-09-03 ENCOUNTER — Telehealth: Payer: Self-pay | Admitting: Genetic Counselor

## 2020-09-03 ENCOUNTER — Encounter: Payer: Self-pay | Admitting: Genetic Counselor

## 2020-09-03 ENCOUNTER — Telehealth: Payer: Self-pay

## 2020-09-03 ENCOUNTER — Encounter: Payer: Self-pay | Admitting: Hematology and Oncology

## 2020-09-03 DIAGNOSIS — Z800 Family history of malignant neoplasm of digestive organs: Secondary | ICD-10-CM | POA: Insufficient documentation

## 2020-09-03 DIAGNOSIS — Z803 Family history of malignant neoplasm of breast: Secondary | ICD-10-CM | POA: Insufficient documentation

## 2020-09-03 NOTE — Telephone Encounter (Signed)
Patient called with concerns regarding medicaid coverage, and how to get her medications needed for her upcoming MRI. Patient informed that I spoke with Rosezetta Schlatter ([facility]-Financial Resource Support), who stated that Patient will be able to get her medications, MRI is scheduled for 09/08/2020, not 09/06/2020, and also has Chemo Education appointment at [facility] on 09/06/2020 @ 10:00am, afterwards will she will speak with patient, needs to bring last pay stub, and Sharyn Lull, her social worker will return on 09/13/2020, and will  discuss other financial options at a later date. Appointments clarified for patient as well. Patient verbalized understanding.

## 2020-09-03 NOTE — Progress Notes (Signed)
Amanda Rich w/[employer] Medicaid came over to follow up regarding [employer] Medicaid. She states everything has been submitted and just waiting on determination from Medicaid.  Discussed obtaining medication for patient. Advised I have spoken with social work and we have a plan for her to get her medication on 9/13 after we meet following her chemo ed class. Advised social work will follow up regarding other outside resources for personal concerns she has.  She will call patient and explain and remind that her MRI is Wed and not Monday.

## 2020-09-03 NOTE — Telephone Encounter (Signed)
LVM requesting that she call back to discuss billing options for genetic testing.

## 2020-09-06 ENCOUNTER — Inpatient Hospital Stay: Payer: Medicaid Other

## 2020-09-06 ENCOUNTER — Ambulatory Visit (HOSPITAL_COMMUNITY)
Admission: RE | Admit: 2020-09-06 | Discharge: 2020-09-06 | Disposition: A | Discharge status: Home / Self Care | Payer: Medicaid Other | Source: Ambulatory Visit | Attending: Hematology and Oncology | Admitting: Hematology and Oncology

## 2020-09-06 ENCOUNTER — Encounter: Payer: Self-pay | Admitting: Hematology and Oncology

## 2020-09-06 ENCOUNTER — Encounter: Payer: Self-pay | Admitting: *Deleted

## 2020-09-06 ENCOUNTER — Other Ambulatory Visit: Payer: Self-pay

## 2020-09-06 DIAGNOSIS — F17200 Nicotine dependence, unspecified, uncomplicated: Secondary | ICD-10-CM | POA: Insufficient documentation

## 2020-09-06 DIAGNOSIS — Z170 Estrogen receptor positive status [ER+]: Secondary | ICD-10-CM | POA: Insufficient documentation

## 2020-09-06 DIAGNOSIS — Z0189 Encounter for other specified special examinations: Secondary | ICD-10-CM | POA: Diagnosis not present

## 2020-09-06 DIAGNOSIS — Z01818 Encounter for other preprocedural examination: Secondary | ICD-10-CM | POA: Diagnosis not present

## 2020-09-06 DIAGNOSIS — C50211 Malignant neoplasm of upper-inner quadrant of right female breast: Secondary | ICD-10-CM | POA: Diagnosis not present

## 2020-09-06 LAB — ECHOCARDIOGRAM COMPLETE
Area-P 1/2: 3.65 cm2
S' Lateral: 3 cm

## 2020-09-06 NOTE — Progress Notes (Signed)
Met with patient at registration whom brought proof of income for [employer].  Patient approved for one-time $1000 [employer] grant to assist with personal expenses while going through treatment. Discussed expenses and how they are covered. She submitted an invoice today to be paid from grant. She was given a resource from Electronics engineer) to obtain medication at [facility]. She has a copy of the approval letter and expense sheet along with the Outpatient pharmacy information.  She has my card for any additional financial questions or concerns.

## 2020-09-06 NOTE — Progress Notes (Signed)
  Echocardiogram 2D Echocardiogram has been performed.  Amanda Rich 09/06/2020, 10:23 AM

## 2020-09-07 ENCOUNTER — Encounter: Payer: Self-pay | Admitting: Pharmacy Technician

## 2020-09-07 NOTE — Progress Notes (Signed)
Pharmacist Chemotherapy Monitoring - Initial Assessment    Anticipated start date: 09/13/2020   Regimen:  . Are orders appropriate based on the patient's diagnosis, regimen, and cycle? Yes . Does the plan date match the patient's scheduled date? Yes . Is the sequencing of drugs appropriate? Yes . Are the premedications appropriate for the patient's regimen? Yes . Prior Authorization for treatment is: Pending o If applicable, is the correct biosimilar selected based on the patient's insurance? not applicable  Organ Function and Labs: Marland Kitchen Are dose adjustments needed based on the patient's renal function, hepatic function, or hematologic function? Yes . Are appropriate labs ordered prior to the start of patient's treatment? Yes . Other organ system assessment, if indicated: trastuzumab: Echo/ MUGA and pertuzumab: Echo/ MUGA . The following baseline labs, if indicated, have been ordered: N/A  Dose Assessment: . Are the drug doses appropriate? Yes . Are the following correct: o Drug concentrations Yes o IV fluid compatible with drug Yes o Administration routes Yes o Timing of therapy Yes . If applicable, does the patient have documented access for treatment and/or plans for port-a-cath placement? yes . If applicable, have lifetime cumulative doses been properly documented and assessed? yes Lifetime Dose Tracking  No doses have been documented on this patient for the following tracked chemicals: Doxorubicin, Epirubicin, Idarubicin, Daunorubicin, Mitoxantrone, Bleomycin, Oxaliplatin, Carboplatin, Liposomal Doxorubicin  o   Toxicity Monitoring/Prevention: . The patient has the following take home antiemetics prescribed: Ondansetron and Prochlorperazine . The patient has the following take home medications prescribed: N/A . Medication allergies and previous infusion related reactions, if applicable, have been reviewed and addressed. no . The patient's current medication list has been assessed  for drug-drug interactions with their chemotherapy regimen. no significant drug-drug interactions were identified on review.  Order Review: . Are the treatment plan orders signed? Yes . Is the patient scheduled to see a provider prior to their treatment? Yes  I verify that I have reviewed each item in the above checklist and answered each question accordingly.  Amanda Rich D 09/07/2020 3:32 PM

## 2020-09-07 NOTE — Progress Notes (Signed)
Patient has been approved for drug assistance by [employer] for [employer]. The enrollment period is from 09/06/20-09/05/21 based on self pay. First DOS covered is 09/15/20.

## 2020-09-08 ENCOUNTER — Telehealth: Payer: Self-pay

## 2020-09-08 ENCOUNTER — Ambulatory Visit
Admission: RE | Admit: 2020-09-08 | Discharge: 2020-09-08 | Disposition: A | Discharge status: Home / Self Care | Payer: No Typology Code available for payment source | Source: Ambulatory Visit | Attending: Hematology and Oncology | Admitting: Hematology and Oncology

## 2020-09-08 DIAGNOSIS — Z170 Estrogen receptor positive status [ER+]: Secondary | ICD-10-CM

## 2020-09-08 DIAGNOSIS — C50211 Malignant neoplasm of upper-inner quadrant of right female breast: Secondary | ICD-10-CM

## 2020-09-08 IMAGING — MR MR BREAST BILAT WO/W CM
8 of 12 series · 32 of 48 positions shown · IV contrast (gadavist)
Comparison: No prior MRI available for comparison. Correlation made
with prior mammograms and ultrasounds.

CLINICAL DATA: 54-year-old female recently diagnosed with grade 3
invasive ductal carcinoma involving the upper inner far posterior
right breast ([DATE], 5 cm from the nipple). She had a second site
biopsied at [DATE], 1 cm from the nipple, which was benign
fibrocystic changes. A biopsied right axillary lymph node was also
benign.

LABS:  None.
EXAM:
BILATERAL BREAST MRI WITH AND WITHOUT CONTRAST
TECHNIQUE: Multiplanar, multisequence MR images of both breasts were obtained
prior to and following the intravenous administration of 5 ml of
Gadavist

[Series 2: t2_tirm_tra ipat (a-p) · axial · 3.0mm · 0.70mm/px · 1 of 55 slices shown]
[im 1/55]
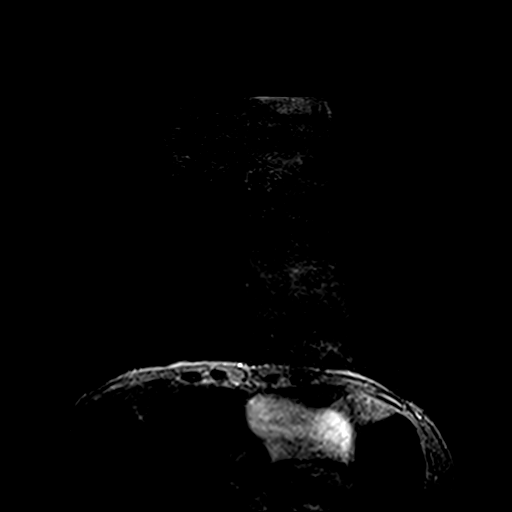

[Series 3: fl3d pre-cm no · axial · non-contrast · 1.2mm · 0.94mm/px · z∈[-88,+103]mm · 5 of 160 slices shown]
[im 1/160]
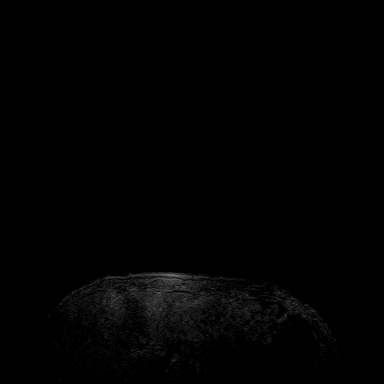
[im 40/160]
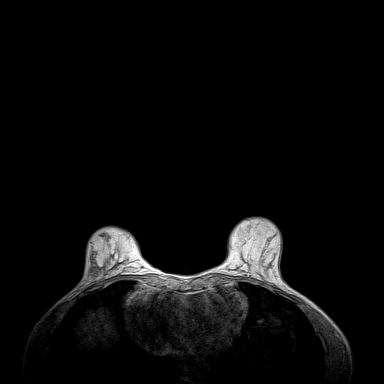
[im 80/160]
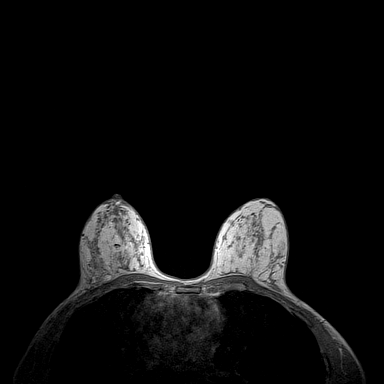
[im 120/160]
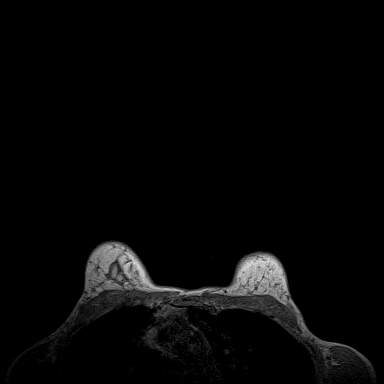
[im 160/160]
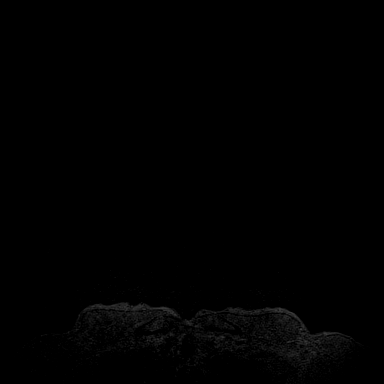

[Series 4: fl3d pre-cm · axial · non-contrast · 1.2mm · 0.94mm/px · z∈[-88,+103]mm · 5 of 160 slices shown]
[im 1/160]
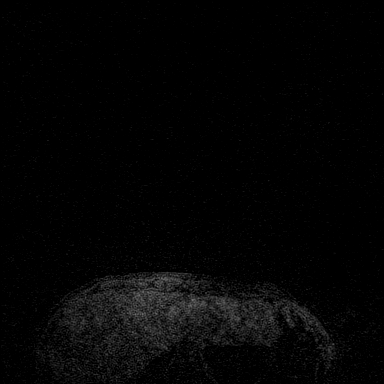
[im 40/160]
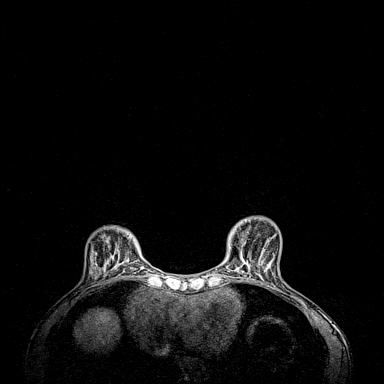
[im 80/160]
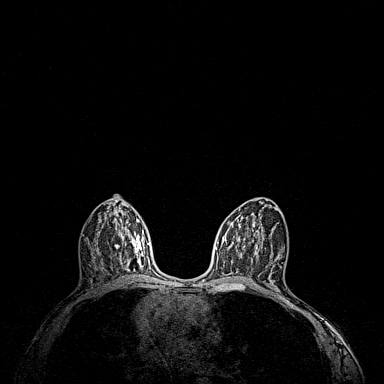
[im 120/160]
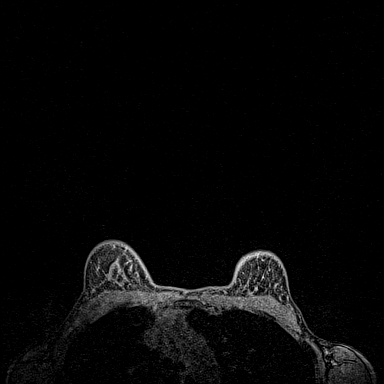
[im 160/160]
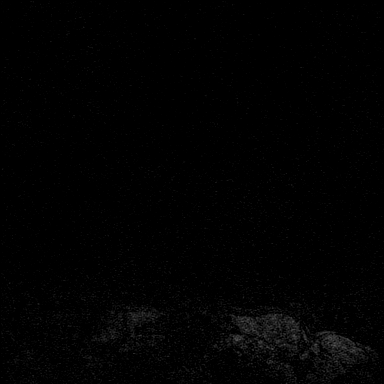

[Series 5: fl3d post-cm 20 · axial · 1.2mm · 0.94mm/px · z∈[-88,+103]mm · 5 of 160 slices shown (1 of 3)]
[im 1/160]
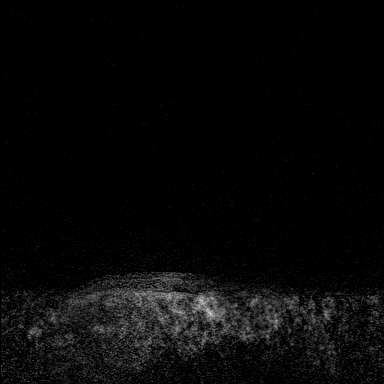
[im 40/160]
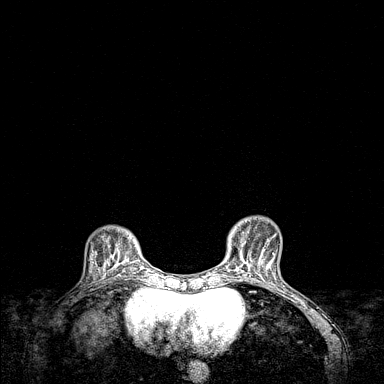
[im 80/160]
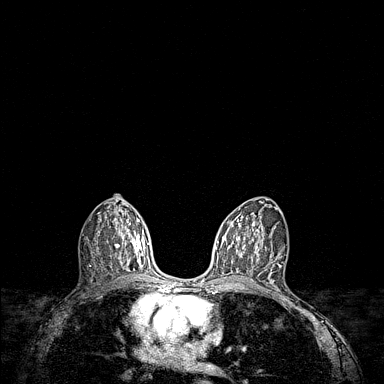
[im 120/160]
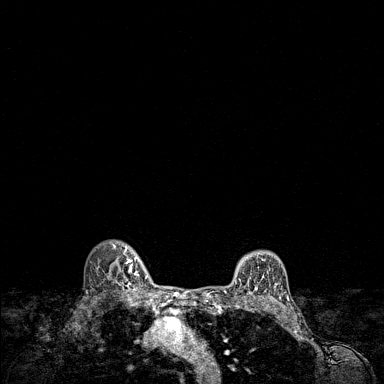
[im 160/160]
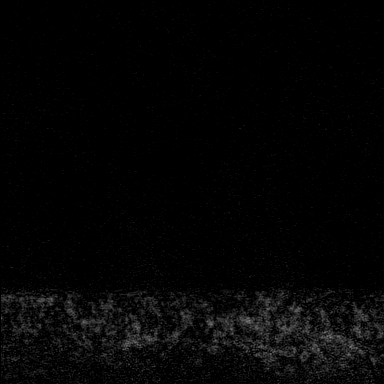

[Series 6: fl3d post-cm 20 · axial · 1.2mm · 0.94mm/px · z∈[-88,+103]mm · 5 of 160 slices shown (2 of 3)]
[im 1/160]
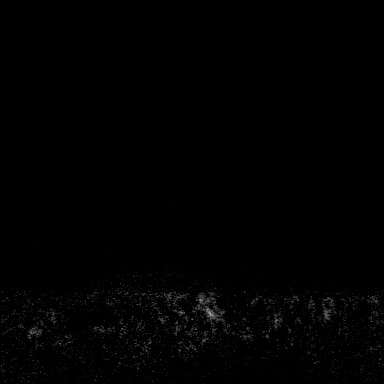
[im 40/160]
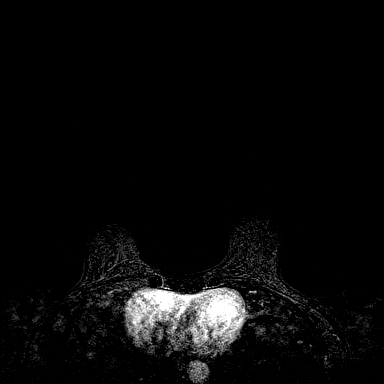
[im 80/160]
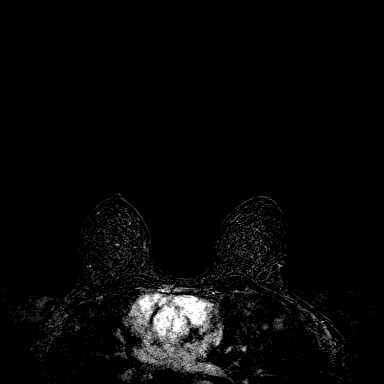
[im 120/160]
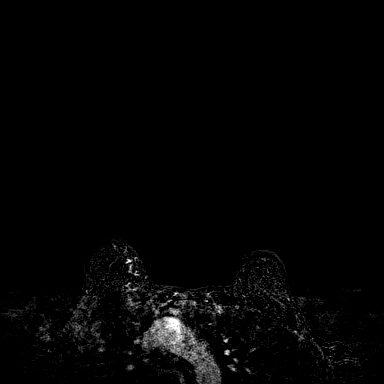
[im 160/160]
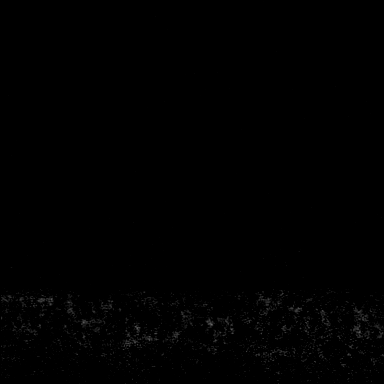

[Series 7: fl3d post-cm 20 · axial · 192.0mm · 0.94mm/px · 1 of 1 slices shown (3 of 3)]
[im 1/1]
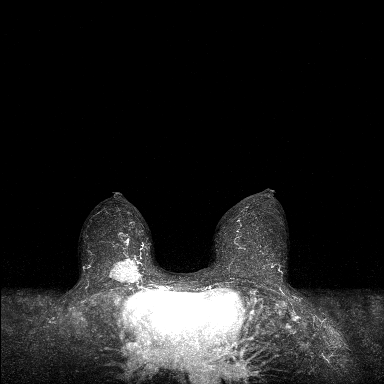

[Series 8: fl3d post-cm 3min · axial · 1.2mm · 0.94mm/px · z∈[-88,+103]mm · 6 of 160 slices shown]
[im 1/160]
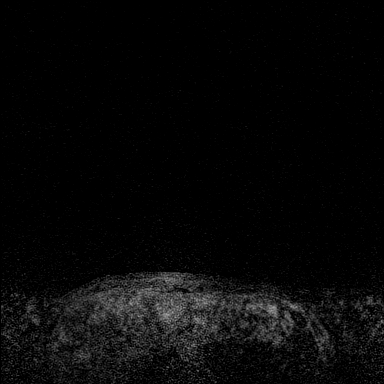
[im 32/160]
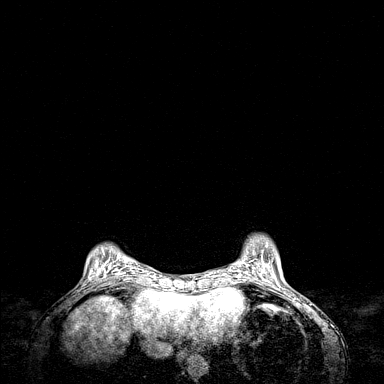
[im 64/160]
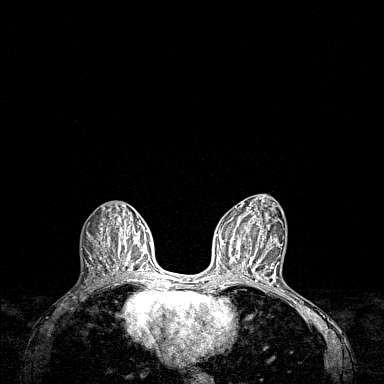
[im 96/160]
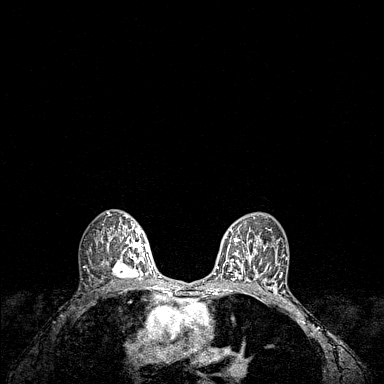
[im 128/160]
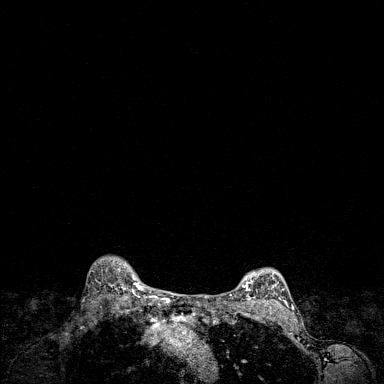
[im 160/160]
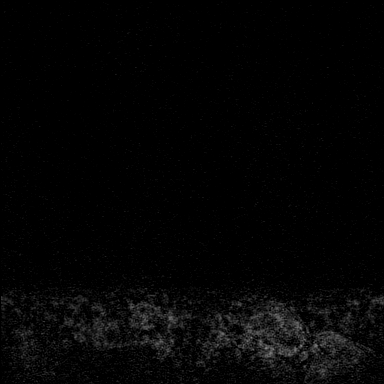

[Series 9: fl3d post-cm 3min_sub · axial · 1.2mm · 0.94mm/px · z∈[-88,+26]mm · 4 of 160 slices shown]
[im 1/160]
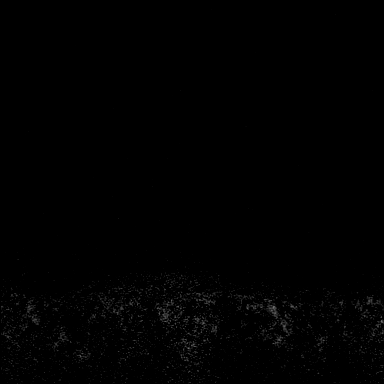
[im 32/160]
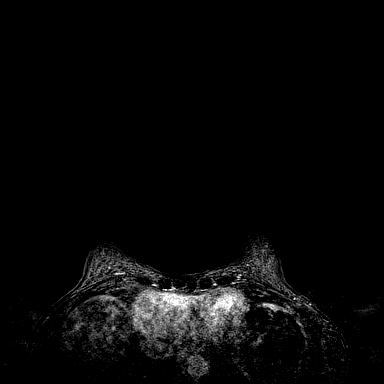
[im 64/160]
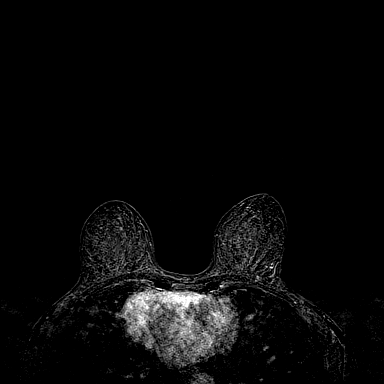
[im 96/160]
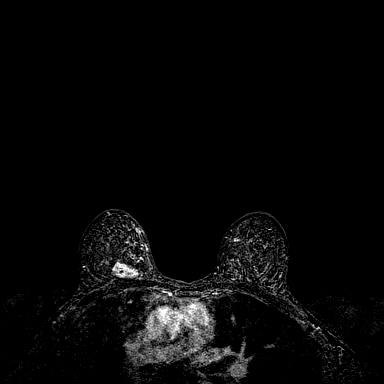

[32 of 48 positions shown; findings below may reference images not displayed]

Three-dimensional MR images were rendered by post-processing of the
original MR data on an independent workstation. The
three-dimensional MR images were interpreted, and findings are
reported in the following complete MRI report for this study. Three
dimensional images were evaluated at the independent interpreting
workstation using the DynaCAD thin client.
FINDINGS: Breast composition: c. Heterogeneous fibroglandular tissue.

Background parenchymal enhancement: Mild.

Right breast: In the upper inner quadrant of the right breast, far
posterior depth abutting the pectoralis muscle, there is a
heterogeneously enhancing oval mass measuring 3.0 x 2.2 x 3.0 cm.
There is loss of fat plane between the mass and the underlying
pectoralis muscle, however no clear enhancement of the muscle is
identified. Susceptibility artifact from the benign
ultrasound-guided biopsy is seen in the upper inner anterior right
breast. There is an 8 mm adjacent hematoma. No additional sites of
abnormal enhancement are identified in the right breast.

Left breast: No suspicious mass or abnormal enhancement.

Lymph nodes: No abnormal appearing lymph nodes. Susceptibility
artifact from the biopsied benign right axillary lymph node is
noted.

Ancillary findings:  None.
IMPRESSION: 1. The biopsy proven malignancy in the upper inner right breast
measures 3.0 x 2.2 x 3.0 cm. The mass abuts the pectoralis muscle
with loss of the fat plane, however there are no findings of muscle
enhancement.

2.  No MRI evidence of left breast malignancy.

3.  No axillary lymphadenopathy.

RECOMMENDATION:
Continue treatment plan for known right breast cancer.

BI-RADS CATEGORY  6: Known biopsy-proven malignancy.

## 2020-09-08 MED ORDER — GADOBUTROL 1 MMOL/ML IV SOLN
5.0000 mL | Freq: Once | INTRAVENOUS | Status: AC | PRN
  Administered 2020-09-08: 5 mL via INTRAVENOUS

## 2020-09-08 NOTE — Progress Notes (Signed)
[facility] Work  Clinical Social Work met with patient Visual merchandiser. CSW signed up patient for [facility] program and provided first set of cards. Patient expressed possible transportation concerns- CSW made referral to transportation department.  Gwinda Maine, LCSW  Clinical Social Worker [facility]

## 2020-09-08 NOTE — Telephone Encounter (Signed)
Nutrition  Patient identified by attending breast clinic on 09/01/20.    Chart reviewed.   54 year old female with new diagnosis of breast cancer.  Planning neoadjuvant chemotherapy, lumpectomy, radiation and antiestrogens.    Ht: 65 inches Wt: 116 lb BMI 19  Relatively stable weight but low BMI.  Concerned about nutritional status with starting neoadjuvant chemotherapy.    Called patient to introduce self and service at [facility].  No answer.  Left message with call back number.   Will add patient to RD's schedule to see during infusion.   Aalia Greulich B. Zenia Resides, [place], [place] Registered Dietitian [phone] (mobile)

## 2020-09-09 ENCOUNTER — Telehealth: Payer: Self-pay | Admitting: Genetic Counselor

## 2020-09-09 ENCOUNTER — Encounter: Payer: Self-pay | Admitting: *Deleted

## 2020-09-09 ENCOUNTER — Telehealth: Payer: Self-pay | Admitting: *Deleted

## 2020-09-09 ENCOUNTER — Other Ambulatory Visit: Payer: Self-pay | Admitting: Radiology

## 2020-09-09 NOTE — Telephone Encounter (Signed)
Spoke with patient to follow up from [facility] and assess navigation needs. She did confirm appointment to start treatment on 9/20 and asked if it was ok to take the lorazepam tonight to help her sleep and I let her know this was ok.  No other questions at this time.  Encouraged her to call if anything arises. Patient verbalized understanding.

## 2020-09-09 NOTE — Telephone Encounter (Signed)
Discussed with [name] that if her 2020 tax return form is not available, the genetic testing laboratory would accept a 2019 tax return as proof of income. Unfortunately, [name] does not have access to her 2019 tax return either. We discussed the option to submit the application with an explanation of why her tax returns are not available, with the understanding that the test would be $250 if the patient assistance application is not approved.   Alternatively, there is also the option to wait until she has Medicaid coverage to order testing and bill through insurance at that time. [name] expects that this will occur within the next month, and she would like to wait until she has Medicaid coverage to order testing. We will reach out to her and coordinate genetic testing at that time.

## 2020-09-09 NOTE — Telephone Encounter (Signed)
Reviewed billing options for genetic testing and recommended that [name] fill out the Patient Garment/textile technologist for the genetic testing laboratory, [facility]. She will plan to stop by the family support center to sign this form when she comes to [place] [employer] (either tomorrow or Monday).   The patient assistance program would waive the cost of genetic testing as long as [name] can provide her proof of income. This typically is done by providing the most recent federal tax return form; however, [name] states that she does not have her most recent tax return. We will discuss alternative proof of income options with [facility] and let her know what may be required.

## 2020-09-09 NOTE — Progress Notes (Signed)
.  Patient has been approved for drug replacement program by [place] for [employer]. [employer] ID: [id number] The enrollment period is from 09/09/20 to therapy d/c or change in insurance/finanical status. First DOS covered is 09/13/20.

## 2020-09-09 NOTE — Progress Notes (Addendum)
Patient has been approved for drug replacement program by [employer] for [place]. The enrollment period is from 09/07/20 to 09/07/21. Id: [id number] First DOS covered is 09/13/20.

## 2020-09-09 NOTE — Progress Notes (Signed)
.  The following biosimilar Kanjinti (trastuzumab-anns) has been selected for use in this patient based on patient medication assistance program approval.

## 2020-09-10 ENCOUNTER — Ambulatory Visit (HOSPITAL_COMMUNITY)
Admission: RE | Admit: 2020-09-10 | Discharge: 2020-09-10 | Disposition: A | Discharge status: Home / Self Care | Payer: Medicaid Other | Source: Ambulatory Visit | Attending: Hematology and Oncology | Admitting: Hematology and Oncology

## 2020-09-10 ENCOUNTER — Other Ambulatory Visit: Payer: Self-pay | Admitting: Hematology and Oncology

## 2020-09-10 ENCOUNTER — Other Ambulatory Visit: Payer: Self-pay

## 2020-09-10 DIAGNOSIS — F329 Major depressive disorder, single episode, unspecified: Secondary | ICD-10-CM | POA: Diagnosis not present

## 2020-09-10 DIAGNOSIS — Z170 Estrogen receptor positive status [ER+]: Secondary | ICD-10-CM

## 2020-09-10 DIAGNOSIS — Z79899 Other long term (current) drug therapy: Secondary | ICD-10-CM | POA: Insufficient documentation

## 2020-09-10 DIAGNOSIS — F17210 Nicotine dependence, cigarettes, uncomplicated: Secondary | ICD-10-CM | POA: Insufficient documentation

## 2020-09-10 DIAGNOSIS — C50211 Malignant neoplasm of upper-inner quadrant of right female breast: Secondary | ICD-10-CM

## 2020-09-10 DIAGNOSIS — C50911 Malignant neoplasm of unspecified site of right female breast: Secondary | ICD-10-CM | POA: Insufficient documentation

## 2020-09-10 HISTORY — PX: IR IMAGING GUIDED PORT INSERTION: IMG5740

## 2020-09-10 MED ORDER — LIDOCAINE-EPINEPHRINE 1 %-1:100000 IJ SOLN
INTRAMUSCULAR | Status: AC | PRN
  Administered 2020-09-10: 20 mL

## 2020-09-10 MED ORDER — LIDOCAINE-EPINEPHRINE 1 %-1:100000 IJ SOLN
INTRAMUSCULAR | Status: AC
  Filled 2020-09-10: qty 1

## 2020-09-10 MED ORDER — FENTANYL CITRATE (PF) 100 MCG/2ML IJ SOLN
INTRAMUSCULAR | Status: AC
  Filled 2020-09-10: qty 2

## 2020-09-10 MED ORDER — FENTANYL CITRATE (PF) 100 MCG/2ML IJ SOLN
INTRAMUSCULAR | Status: AC | PRN
  Administered 2020-09-10 (×2): 50 ug via INTRAVENOUS

## 2020-09-10 MED ORDER — CEFAZOLIN SODIUM-DEXTROSE 2-4 GM/100ML-% IV SOLN: 2.0000 g | INTRAVENOUS | Status: AC

## 2020-09-10 MED ORDER — MIDAZOLAM HCL 2 MG/2ML IJ SOLN
INTRAMUSCULAR | Status: AC | PRN
  Administered 2020-09-10 (×4): 1 mg via INTRAVENOUS

## 2020-09-10 MED ORDER — CEFAZOLIN SODIUM-DEXTROSE 2-4 GM/100ML-% IV SOLN
INTRAVENOUS | Status: AC
  Administered 2020-09-10: 2 g via INTRAVENOUS
  Filled 2020-09-10: qty 100

## 2020-09-10 MED ORDER — SODIUM CHLORIDE 0.9 % IV SOLN: INTRAVENOUS | Status: DC

## 2020-09-10 MED ORDER — MIDAZOLAM HCL 2 MG/2ML IJ SOLN
INTRAMUSCULAR | Status: AC
  Filled 2020-09-10: qty 4

## 2020-09-10 MED ORDER — HEPARIN SOD (PORK) LOCK FLUSH 100 UNIT/ML IV SOLN
INTRAVENOUS | Status: AC | PRN
  Administered 2020-09-10: 500 [IU] via INTRAVENOUS

## 2020-09-10 MED ORDER — HEPARIN SOD (PORK) LOCK FLUSH 100 UNIT/ML IV SOLN
INTRAVENOUS | Status: AC
  Filled 2020-09-10: qty 5

## 2020-09-10 NOTE — Consult Note (Signed)
Chief Complaint: Patient was seen in consultation today for port a cath placement  Referring Physician(s): Ekron  Supervising Physician: Suttle,D  Patient Status: [facility] - Out-pt  History of Present Illness: Amanda Rich is a 54 y.o. female smoker with history of newly diagnosed left breast carcinoma who presents today for Port-A-Cath placement for chemotherapy.  Past Medical History:  Diagnosis Date  . Anemia   . Depression   . Family history of breast cancer   . Family history of liver cancer   . Smoker   . Umbilical hernia     Past Surgical History:  Procedure Laterality Date  . ABLATION ON ENDOMETRIOSIS    . GANGLION CYST EXCISION  2007   L wrist; [facility], [place]  . TUBAL LIGATION     [facility]  . UMBILICAL HERNIA REPAIR  2005   [place]    Allergies: Clarithromycin  Medications: Prior to Admission medications   Medication Sig Start Date End Date Taking? Authorizing Provider  albuterol (PROVENTIL HFA;VENTOLIN HFA) 108 (90 Base) MCG/ACT inhaler Inhale 2 puffs into the lungs every 6 (six) hours as needed for wheezing or shortness of breath. 01/30/19  Yes Tysinger, Camelia Eng, PA-C  Biotin 5000 MCG CAPS Take 1 capsule by mouth daily.    Yes [provider]  Cholecalciferol (VITAMIN D3) 2000 units TABS Take 1 tablet by mouth daily.    Yes [provider]  Colchicine (MITIGARE) 0.6 MG CAPS Take 0.6 mg by mouth daily. 01/09/19  Yes Henson, Vickie L, NP-C  dexamethasone (DECADRON) 4 MG tablet Take 2 tablets (8 mg total) by mouth daily. Take 1 tablet day before chemo and 1 tablet day after chemo with food 09/01/20  Yes Nicholas Lose, MD  LORazepam (ATIVAN) 0.5 MG tablet Take 1 tablet (0.5 mg total) by mouth at bedtime as needed (Nausea or vomiting). 09/01/20  Yes Nicholas Lose, MD  Multiple Vitamin (MULTIVITAMIN WITH MINERALS) TABS Take 1 tablet by mouth daily.   Yes [provider]  TURMERIC PO Take 1 tablet by mouth daily.    Yes [provider]  citalopram (CELEXA) 20 MG tablet Take 1 tablet (20 mg total) by mouth daily. Patient taking differently: Take 10 mg by mouth daily.  06/05/18   Henson, Vickie L, NP-C  lidocaine-prilocaine (EMLA) cream Apply to affected area once 09/01/20   Nicholas Lose, MD  ondansetron (ZOFRAN) 8 MG tablet Take 1 tablet (8 mg total) by mouth 2 (two) times daily as needed (Nausea or vomiting). Start on the third day after chemotherapy. 09/01/20   Nicholas Lose, MD  oseltamivir (TAMIFLU) 75 MG capsule Take 1 capsule (75 mg total) by mouth 2 (two) times daily. 01/30/19   Tysinger, Camelia Eng, PA-C  Prenatal Vit-DSS-Fe Cbn-FA (PRENATAL ADVANTAGE PO) Take by mouth.    [provider]  prochlorperazine (COMPAZINE) 10 MG tablet Take 1 tablet (10 mg total) by mouth every 6 (six) hours as needed (Nausea or vomiting). 09/01/20   Nicholas Lose, MD     Family History  Problem Relation Age of Onset  . Diabetes Mother   . Hypertension Mother   . Liver cancer Daughter 23  . Diabetes Maternal Aunt   . Breast cancer Maternal Aunt 39  . Colon cancer Maternal Uncle 66       or prostate cancer  . Colon cancer Cousin   . Breast cancer Cousin   . Autism Half-Brother   . Breast cancer Other 55       maternal second  cousin  . Diabetes Paternal Uncle   . Breast cancer Other        dx. 80s, paternal second cousin  . Lung cancer Other        dx. 57s, paternal cousin once removed (father's cousin)    Social History   Socioeconomic History  . Marital status: Legally Separated    Spouse name: Not on file  . Number of children: 3  . Years of education: Not on file  . Highest education level: Associate degree: academic program  Occupational History  . Not on file  Tobacco Use  . Smoking status: Current Some Day Smoker    Packs/day: 1.00    Years: 25.00    Pack years: 25.00    Types: Cigarettes  . Smokeless tobacco: Never Used  . Tobacco comment: smoking cigarettes again and not e-cigs  Vaping Use  . Vaping Use:  Never used  Substance and Sexual Activity  . Alcohol use: Yes    Alcohol/week: 1.0 standard drink    Types: 1 Glasses of wine per week  . Drug use: No  . Sexual activity: Yes    Birth control/protection: Post-menopausal  Other Topics Concern  . Not on file  Social History Narrative  . Not on file   Social Determinants of Health   Financial Resource Strain: High Risk  . Difficulty of Paying Living Expenses: Very hard  Food Insecurity: Food Insecurity Present  . Worried About Charity fundraiser in the Last Year: Sometimes true  . Ran Out of Food in the Last Year: Never true  Transportation Needs: No Transportation Needs  . Lack of Transportation (Medical): No  . Lack of Transportation (Non-Medical): No  Physical Activity:   . Days of Exercise per Week: Not on file  . Minutes of Exercise per Session: Not on file  Stress:   . Feeling of Stress : Not on file  Social Connections:   . Frequency of Communication with Friends and Family: Not on file  . Frequency of Social Gatherings with Friends and Family: Not on file  . Attends Religious Services: Not on file  . Active Member of Clubs or Organizations: Not on file  . Attends Archivist Meetings: Not on file  . Marital Status: Not on file     Review of Systems denies fever, headache, chest pain, worsening dyspnea, abdominal pain, back pain, nausea, vomiting or bleeding.  She does have some chest congestion as well as cough.  Vital Signs: BP 138/80   Pulse 64   Temp 97.9 F (36.6 C) (Oral)   Resp 16   SpO2 100%   Physical Exam awake/alert; chest with scattered rhonchi; heart with regular rate and rhythm.  Abdomen soft, positive bowel sounds, nontender.  No lower extremity edema.    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 She had a second site biopsied at 12:30, 1 cm from the nipple, which was benign fibrocystic changes. A biopsied right axillary lymph node was also benign. LABS:  None. EXAM: BILATERAL BREAST MRI WITH AND WITHOUT CONTRAST TECHNIQUE: Multiplanar, multisequence MR images of both breasts were obtained prior to and following the intravenous administration of 5 ml of Gadavist Three-dimensional MR images were rendered by post-processing of the original MR data on an independent workstation. The three-dimensional MR  images were interpreted, and findings are reported in the following complete MRI report for this study. Three dimensional images were evaluated at the independent interpreting workstation using the DynaCAD thin client. COMPARISON:  No prior MRI available for comparison. Correlation made with prior mammograms and ultrasounds. FINDINGS: Breast composition: c. Heterogeneous fibroglandular tissue. Background parenchymal enhancement: Mild. Right breast: In the upper inner quadrant of the right breast, far posterior depth abutting the pectoralis muscle, there is a heterogeneously enhancing oval mass measuring 3.0 x 2.2 x 3.0 cm. There is loss of fat plane between the mass and the underlying pectoralis muscle, however no clear enhancement of the muscle is identified. Susceptibility artifact from the benign ultrasound-guided biopsy is seen in the upper inner anterior right breast. There is an 8 mm adjacent hematoma. No additional sites of abnormal enhancement are identified in the right breast. Left breast: No suspicious mass or abnormal enhancement. Lymph nodes: No abnormal appearing lymph nodes. Susceptibility artifact from the biopsied benign right axillary lymph node is noted. Ancillary findings:  None. IMPRESSION: 1. The biopsy proven malignancy in the upper inner right breast measures 3.0 x 2.2 x 3.0 cm. The mass abuts the pectoralis muscle with loss of the fat plane, however there are no  findings of muscle enhancement. 2.  No MRI evidence of left breast malignancy. 3.  No axillary lymphadenopathy. RECOMMENDATION: Continue treatment plan for known right breast cancer. BI-RADS CATEGORY  6: Known biopsy-proven malignancy. Electronically Signed   By: Ammie Ferrier M.D.   On: 09/08/2020 15:18   US BREAST LTD UNI LEFT INC AXILLA  Result Date: 08/17/2020 CLINICAL DATA:  54 year old female with an enlarging palpable right breast lump for approximately 1 year and palpable left breast lump for 2 years. EXAM: DIGITAL DIAGNOSTIC BILATERAL MAMMOGRAM WITH CAD AND TOMO ULTRASOUND BILATERAL BREAST COMPARISON:  Previous exam(s). ACR Breast Density Category c: The breast tissue is heterogeneously dense, which may obscure small masses. FINDINGS: Radiopaque BBs are placed at the site of the patient's bilateral palpable lumps. An irregular, hyperdense mass is seen in the upper inner quadrant at far posterior depth on the right, in correlation with the radiopaque BB. An additional partially circumscribed mass is identified in the central medial aspect at middle depth. A circumscribed mixed echogenicity, fat containing mass is seen deep to the radiopaque BB in the upper inner quadrant of the left breast. No additional suspicious findings are identified. Mammographic images were processed with CAD. Targeted ultrasound is performed, showing an irregular hypoechoic mass at the 12:30 position 5 cm from the nipple. It measures 2.7 x 2.6 x 1.8 cm and demonstrates internal vascularity. An additional oval, circumscribed hypoechoic mass is identified at the 12:30 position 1 cm from the nipple. It measures 8 x 7 x 7 mm. There is no internal vascularity. Evaluation of the right axilla demonstrates borderline cortical thickening up to 0.33 mm. Evaluation of the left breast demonstrates a circumscribed mixed echogenicity mass at the 11 o'clock position 5 cm from the nipple measuring up to 4 cm. This corresponds with the  mammographic finding and is consistent with a hamartoma. IMPRESSION: 1. Suspicious right breast mass at the 12:30 position 5 cm from the nipple corresponding with the patient's palpable lump. Recommendation is for ultrasound-guided biopsy. 2. Indeterminate right breast mass at the 12:30 position 1 cm from the nipple. Recommendation is for ultrasound-guided biopsy. 3. Borderline right axillary lymphadenopathy. Recommendation is for ultrasound-guided biopsy. 4. Benign left breast hamartoma corresponding with the patient's palpable lump. RECOMMENDATION: 1. Three  area ultrasound-guided biopsy of the right breast/axilla. 2. Pending above biopsy results, further evaluation with contrast enhanced breast MRI is recommended given the multiplicity of findings and deep location of the patient's palpable mass. I have discussed the findings and recommendations with the patient. If applicable, a reminder letter will be sent to the patient regarding the next appointment. BI-RADS CATEGORY  5: Highly suggestive of malignancy. Electronically Signed   By: [name] M.D.   On: 08/17/2020 14:33   US BREAST LTD UNI RIGHT INC AXILLA  Result Date: 08/17/2020 CLINICAL DATA:  54 year old female with an enlarging palpable right breast lump for approximately 1 year and palpable left breast lump for 2 years. EXAM: DIGITAL DIAGNOSTIC BILATERAL MAMMOGRAM WITH CAD AND TOMO ULTRASOUND BILATERAL BREAST COMPARISON:  Previous exam(s). ACR Breast Density Category c: The breast tissue is heterogeneously dense, which may obscure small masses. FINDINGS: Radiopaque BBs are placed at the site of the patient's bilateral palpable lumps. An irregular, hyperdense mass is seen in the upper inner quadrant at far posterior depth on the right, in correlation with the radiopaque BB. An additional partially circumscribed mass is identified in the central medial aspect at middle depth. A circumscribed mixed echogenicity, fat containing mass is seen deep to  the radiopaque BB in the upper inner quadrant of the left breast. No additional suspicious findings are identified. Mammographic images were processed with CAD. Targeted ultrasound is performed, showing an irregular hypoechoic mass at the 12:30 position 5 cm from the nipple. It measures 2.7 x 2.6 x 1.8 cm and demonstrates internal vascularity. An additional oval, circumscribed hypoechoic mass is identified at the 12:30 position 1 cm from the nipple. It measures 8 x 7 x 7 mm. There is no internal vascularity. Evaluation of the right axilla demonstrates borderline cortical thickening up to 0.33 mm. Evaluation of the left breast demonstrates a circumscribed mixed echogenicity mass at the 11 o'clock position 5 cm from the nipple measuring up to 4 cm. This corresponds with the mammographic finding and is consistent with a hamartoma. IMPRESSION: 1. Suspicious right breast mass at the 12:30 position 5 cm from the nipple corresponding with the patient's palpable lump. Recommendation is for ultrasound-guided biopsy. 2. Indeterminate right breast mass at the 12:30 position 1 cm from the nipple. Recommendation is for ultrasound-guided biopsy. 3. Borderline right axillary lymphadenopathy. Recommendation is for ultrasound-guided biopsy. 4. Benign left breast hamartoma corresponding with the patient's palpable lump. RECOMMENDATION: 1. Three area ultrasound-guided biopsy of the right breast/axilla. 2. Pending above biopsy results, further evaluation with contrast enhanced breast MRI is recommended given the multiplicity of findings and deep location of the patient's palpable mass. I have discussed the findings and recommendations with the patient. If applicable, a reminder letter will be sent to the patient regarding the next appointment. BI-RADS CATEGORY  5: Highly suggestive of malignancy. Electronically Signed   By: [name] M.D.   On: 08/17/2020 14:33   ECHOCARDIOGRAM COMPLETE  Result Date: 09/06/2020     ECHOCARDIOGRAM REPORT   Patient Name:   Arlyn Dunning Date of Exam: 09/06/2020 Medical Rec #:  [id number]         Height:       65.0 in Accession #:    [id number]        Weight:       116.0 lb Date of Birth:  12-Mar-1966         BSA:          1.569 m Patient Age:  54 years          BP:           131/77 mmHg Patient Gender: F                 HR:           62 bpm. Exam Location:  Outpatient Procedure: 2D Echo, 3D Echo, Cardiac Doppler, Color Doppler and Strain Analysis Indications:    Z51.11 Encounter for antineoplastic chemotheraphy  History:        Patient has prior history of Echocardiogram examinations, most                 recent 12/27/2018. Risk Factors:Current Smoker.  Sonographer:    Tiffany Dance Referring Phys: [id number] Nicholas Lose IMPRESSIONS  1. Left ventricular ejection fraction, by estimation, is 60 to 65%. The left ventricle has normal function. The left ventricle has no regional wall motion abnormalities. Left ventricular diastolic parameters were normal. The average left ventricular global longitudinal strain is -22.0 %. The global longitudinal strain is normal.  2. Right ventricular systolic function is normal. The right ventricular size is normal. Tricuspid regurgitation signal is inadequate for assessing PA pressure.  3. The mitral valve is normal in structure. Trivial mitral valve regurgitation. No evidence of mitral stenosis.  4. The aortic valve is tricuspid. Aortic valve regurgitation is not visualized. Mild aortic valve sclerosis is present, with no evidence of aortic valve stenosis.  5. The inferior vena cava is normal in size with greater than 50% respiratory variability, suggesting right atrial pressure of 3 mmHg. FINDINGS  Left Ventricle: Left ventricular ejection fraction, by estimation, is 60 to 65%. The left ventricle has normal function. The left ventricle has no regional wall motion abnormalities. The average left ventricular global longitudinal strain is -22.0 %. The global  longitudinal strain is normal. The left ventricular internal cavity size was normal in size. There is no left ventricular hypertrophy. Left ventricular diastolic parameters were normal. Normal left ventricular filling pressure. Right Ventricle: The right ventricular size is normal. No increase in right ventricular wall thickness. Right ventricular systolic function is normal. Tricuspid regurgitation signal is inadequate for assessing PA pressure. Left Atrium: Left atrial size was normal in size. Right Atrium: Right atrial size was normal in size. Pericardium: There is no evidence of pericardial effusion. Mitral Valve: The mitral valve is normal in structure. Trivial mitral valve regurgitation. No evidence of mitral valve stenosis. Tricuspid Valve: The tricuspid valve is normal in structure. Tricuspid valve regurgitation is trivial. No evidence of tricuspid stenosis. Aortic Valve: The aortic valve is tricuspid. Aortic valve regurgitation is not visualized. Mild aortic valve sclerosis is present, with no evidence of aortic valve stenosis. Pulmonic Valve: The pulmonic valve was normal in structure. Pulmonic valve regurgitation is not visualized. No evidence of pulmonic stenosis. Aorta: The aortic root is normal in size and structure. Venous: The inferior vena cava is normal in size with greater than 50% respiratory variability, suggesting right atrial pressure of 3 mmHg. IAS/Shunts: The interatrial septum appears to be lipomatous. No atrial level shunt detected by color flow Doppler.  LEFT VENTRICLE PLAX 2D LVIDd:         4.10 cm  Diastology LVIDs:         3.00 cm  LV e' medial:    10.90 cm/s LV PW:         0.70 cm  LV E/e' medial:  6.2 LV IVS:        0.60 cm  LV e' lateral:   12.70 cm/s LVOT diam:     1.80 cm  LV E/e' lateral: 5.3 LV SV:         50 LV SV Index:   32       2D Longitudinal Strain LVOT Area:     2.54 cm 2D Strain GLS (A2C):   -23.4 %                         2D Strain GLS (A3C):   -23.3 %                          2D Strain GLS (A4C):   -19.1 %                         2D Strain GLS Avg:     -22.0 %                          3D Volume EF:                         3D EF:        64 %                         LV EDV:       121 ml                         LV ESV:       44 ml                         LV SV:        78 ml RIGHT VENTRICLE             IVC RV Basal diam:  2.40 cm     IVC diam: 1.50 cm RV S prime:     10.10 cm/s TAPSE (M-mode): 1.6 cm LEFT ATRIUM           Index       RIGHT ATRIUM          Index LA diam:      2.50 cm 1.59 cm/m  RA Area:     8.94 cm LA Vol (A2C): 45.0 ml 28.67 ml/m RA Volume:   17.50 ml 11.15 ml/m LA Vol (A4C): 11.7 ml 7.46 ml/m  AORTIC VALVE LVOT Vmax:   97.50 cm/s LVOT Vmean:  67.500 cm/s LVOT VTI:    0.198 m  AORTA Ao Root diam: 2.90 cm Ao Asc diam:  3.20 cm MITRAL VALVE MV Area (PHT): 3.65 cm    SHUNTS MV Decel Time: 208 msec    Systemic VTI:  0.20 m MV E velocity: 67.80 cm/s  Systemic Diam: 1.80 cm MV A velocity: 47.00 cm/s MV E/A ratio:  1.44 Fransico Him MD Electronically signed by Fransico Him MD Signature Date/Time: 09/06/2020/11:28:46 AM    Final    [place] AXILLARY NODE CORE BIOPSY RIGHT  Addendum Date: 08/24/2020   ADDENDUM REPORT: 08/24/2020 14:44 ADDENDUM: Pathology revealed GRADE III INVASIVE DUCTAL CARCINOMA of the RIGHT breast, 12:30 o'clock, 5 cmfn. This was found to be concordant by Dr. Lajean Manes. Pathology revealed FIBROCYSTIC CHANGE of the RIGHT breast, 12:30 o'clock, 1 cmfn. This was found to be concordant  by Dr. Lajean Manes. Pathology revealed BENIGN LYMPH NODE of the RIGHT axilla. This was found to be concordant by Dr. Lajean Manes. Pathology results were discussed with the patient by telephone. The patient reported doing well after the biopsies with tenderness at the sites. Post biopsy instructions and care were reviewed and questions were answered. The patient was encouraged to call [facility] for any additional concerns. The patient was  referred to [facility] on September 01, 2020. Breast MRI recommended for extent. Pathology results reported by Stacie Acres RN on 08/24/2020. Electronically Signed   By: Lajean Manes M.D.   On: 08/24/2020 14:44   Result Date: 08/24/2020 CLINICAL DATA:  Patient presents for ultrasound-guided core needle biopsy of a highly suspicious, palpable mass in the right breast at 12:30 o'clock, 5 cm the nipple, a second, indeterminate, 8 mm right breast mass at 12:30 o'clock, 1 cm from the nipple, and a right axillary lymph node with a borderline thickened cortex to 3 mm. EXAM: ULTRASOUND GUIDED RIGHT BREAST CORE NEEDLE BIOPSY ULTRASOUND GUIDED RIGHT BREAST CORE NEEDLE BIOPSY ULTRASOUND GUIDED RIGHT AXILLA CORE NEEDLE BIOPSY COMPARISON:  Previous exam(s). PROCEDURE: I met with the patient and we discussed the procedure of ultrasound-guided biopsy, including benefits and alternatives. We discussed the high likelihood of a successful procedure. We discussed the risks of the procedure, including infection, bleeding, tissue injury, clip migration, and inadequate sampling. Informed written consent was given. The usual time-out protocol was performed immediately prior to the procedure. Lesion #1: 12:30 o'clock, 5 cmfn.  2.7 cm mass. Lesion quadrant: Upper inner quadrant Using sterile technique and 1% Lidocaine as local anesthetic, under direct ultrasound visualization, a 12 gauge spring-loaded device was used to perform biopsy of the 2.7 cm mass using an inferior approach. At the conclusion of the procedure ribbon shaped tissue marker clip was deployed into the biopsy cavity. Lesion #2: 12:30 o'clock, 1 cmfn.  8 mm mass. Lesion quadrant: Upper inner quadrant Using sterile technique and 1% Lidocaine as local anesthetic, under direct ultrasound visualization, a 12 gauge spring-loaded device was used to perform biopsy of the 8 mm mass using a medial approach.  At the conclusion of the procedure coil shaped tissue marker clip was deployed into the biopsy cavity. Lesion #3: Right axillary lymph node. Lesion location: Right axilla. Using sterile technique and 1% Lidocaine as local anesthetic, under direct ultrasound visualization, a 14 gauge spring-loaded device was used to perform biopsy of right axillary lymph node with borderline thickened cortex using a inferolateral approach. At the conclusion of the procedure Q shaped tissue marker clip was deployed into the biopsy cavity. Follow up 2 view mammogram was performed and dictated separately. IMPRESSION: Ultrasound guided biopsy of 2 right breast masses and a borderline right axillary lymph node. No apparent complications. Electronically Signed: By: Lajean Manes M.D. On: 08/23/2020 09:45   MS DIGITAL DIAG TOMO BILAT  Result Date: 08/17/2020 CLINICAL DATA:  54 year old female with an enlarging palpable right breast lump for approximately 1 year and palpable left breast lump for 2 years. EXAM: DIGITAL DIAGNOSTIC BILATERAL MAMMOGRAM WITH CAD AND TOMO ULTRASOUND BILATERAL BREAST COMPARISON:  Previous exam(s). ACR Breast Density Category c: The breast tissue is heterogeneously dense, which may obscure small masses. FINDINGS: Radiopaque BBs are placed at the site of the patient's bilateral palpable lumps. An irregular, hyperdense mass is seen in the upper inner quadrant at far posterior depth on the right,  in correlation with the radiopaque BB. An additional partially circumscribed mass is identified in the central medial aspect at middle depth. A circumscribed mixed echogenicity, fat containing mass is seen deep to the radiopaque BB in the upper inner quadrant of the left breast. No additional suspicious findings are identified. Mammographic images were processed with CAD. Targeted ultrasound is performed, showing an irregular hypoechoic mass at the 12:30 position 5 cm from the nipple. It measures 2.7 x 2.6 x 1.8 cm and  demonstrates internal vascularity. An additional oval, circumscribed hypoechoic mass is identified at the 12:30 position 1 cm from the nipple. It measures 8 x 7 x 7 mm. There is no internal vascularity. Evaluation of the right axilla demonstrates borderline cortical thickening up to 0.33 mm. Evaluation of the left breast demonstrates a circumscribed mixed echogenicity mass at the 11 o'clock position 5 cm from the nipple measuring up to 4 cm. This corresponds with the mammographic finding and is consistent with a hamartoma. IMPRESSION: 1. Suspicious right breast mass at the 12:30 position 5 cm from the nipple corresponding with the patient's palpable lump. Recommendation is for ultrasound-guided biopsy. 2. Indeterminate right breast mass at the 12:30 position 1 cm from the nipple. Recommendation is for ultrasound-guided biopsy. 3. Borderline right axillary lymphadenopathy. Recommendation is for ultrasound-guided biopsy. 4. Benign left breast hamartoma corresponding with the patient's palpable lump. RECOMMENDATION: 1. Three area ultrasound-guided biopsy of the right breast/axilla. 2. Pending above biopsy results, further evaluation with contrast enhanced breast MRI is recommended given the multiplicity of findings and deep location of the patient's palpable mass. I have discussed the findings and recommendations with the patient. If applicable, a reminder letter will be sent to the patient regarding the next appointment. BI-RADS CATEGORY  5: Highly suggestive of malignancy. Electronically Signed   By: [name] M.D.   On: 08/17/2020 14:33   MM CLIP PLACEMENT RIGHT  Result Date: 08/23/2020 CLINICAL DATA:  Status post ultrasound-guided core needle biopsy of 2 right breast masses and a right axillary lymph node. EXAM: DIAGNOSTIC RIGHT MAMMOGRAM POST ULTRASOUND BIOPSY COMPARISON:  Previous exam(s). FINDINGS: Mammographic images were obtained following ultrasound guided biopsy of a palpable right breast mass at  12:30 o'clock, 5 cm the nipple, a small, 8 mm, right breast mass at 12:30 o'clock, 1 cm from the nipple, and a borderline abnormal right axillary lymph node. The ribbon shaped biopsy clip lies within the palpable mass at 12:30 o'clock, 5 cm the nipple. The coil shaped biopsy clip lies in the expected location of the smaller mass at 12:30 o'clock, 1 cm from the nipple. The Q shaped biopsy clip placed within the axillary lymph node cannot be visualized mammographically. IMPRESSION: Appropriate positioning of the ribbon and coil shaped biopsy marking clips at the site of biopsy in the upper inner right breast. The Q shaped biopsy clip within the right axillary lymph node cannot be visualized mammographically. Final Assessment: Post Procedure Mammograms for Marker Placement Electronically Signed   By: Lajean Manes M.D.   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 This was found to be concordant by Dr. Lajean Manes. Pathology revealed FIBROCYSTIC CHANGE of the RIGHT breast, 12:30 o'clock, 1 cmfn. This was found to be concordant by Dr. Lajean Manes. Pathology revealed BENIGN LYMPH NODE of the  RIGHT axilla. This was found to be concordant by Dr. Lajean Manes. Pathology results were discussed with the patient by telephone. The patient reported doing well after the biopsies with tenderness at the sites. Post biopsy instructions and care were reviewed and questions were answered. The patient was encouraged to call [facility] for any additional concerns. The patient was referred to [facility] on September 01, 2020. Breast MRI recommended for extent. Pathology results reported by Stacie Acres RN on 08/24/2020.  Electronically Signed   By: Lajean Manes M.D.   On: 08/24/2020 14:44   Result Date: 08/24/2020 CLINICAL DATA:  Patient presents for ultrasound-guided core needle biopsy of a highly suspicious, palpable mass in the right breast at 12:30 o'clock, 5 cm the nipple, a second, indeterminate, 8 mm right breast mass at 12:30 o'clock, 1 cm from the nipple, and a right axillary lymph node with a borderline thickened cortex to 3 mm. EXAM: ULTRASOUND GUIDED RIGHT BREAST CORE NEEDLE BIOPSY ULTRASOUND GUIDED RIGHT BREAST CORE NEEDLE BIOPSY ULTRASOUND GUIDED RIGHT AXILLA CORE NEEDLE BIOPSY COMPARISON:  Previous exam(s). PROCEDURE: I met with the patient and we discussed the procedure of ultrasound-guided biopsy, including benefits and alternatives. We discussed the high likelihood of a successful procedure. We discussed the risks of the procedure, including infection, bleeding, tissue injury, clip migration, and inadequate sampling. Informed written consent was given. The usual time-out protocol was performed immediately prior to the procedure. Lesion #1: 12:30 o'clock, 5 cmfn.  2.7 cm mass. Lesion quadrant: Upper inner quadrant Using sterile technique and 1% Lidocaine as local anesthetic, under direct ultrasound visualization, a 12 gauge spring-loaded device was used to perform biopsy of the 2.7 cm mass using an inferior approach. At the conclusion of the procedure ribbon shaped tissue marker clip was deployed into the biopsy cavity. Lesion #2: 12:30 o'clock, 1 cmfn.  8 mm mass. Lesion quadrant: Upper inner quadrant Using sterile technique and 1% Lidocaine as local anesthetic, under direct ultrasound visualization, a 12 gauge spring-loaded device was used to perform biopsy of the 8 mm mass using a medial approach. At the conclusion of the procedure coil shaped tissue marker clip was deployed into the biopsy cavity. Lesion #3: Right axillary lymph node. Lesion location: Right axilla. Using sterile technique and 1% Lidocaine as  local anesthetic, under direct ultrasound visualization, a 14 gauge spring-loaded device was used to perform biopsy of right axillary lymph node with borderline thickened cortex using a inferolateral approach. At the conclusion of the procedure Q shaped tissue marker clip was deployed into the biopsy cavity. Follow up 2 view mammogram was performed and dictated separately. IMPRESSION: Ultrasound guided biopsy of 2 right breast masses and a borderline right axillary lymph node. No apparent complications. Electronically Signed: By: Lajean Manes M.D. 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 This was found to be concordant by Dr. Lajean Manes. Pathology revealed FIBROCYSTIC CHANGE of the RIGHT breast, 12:30 o'clock, 1 cmfn. This was found to be concordant by Dr. Lajean Manes. Pathology revealed BENIGN LYMPH NODE of the RIGHT axilla. This was found to be concordant by Dr. Lajean Manes. Pathology results were discussed with the patient by telephone. The patient reported doing well after the  biopsies with tenderness at the sites. Post biopsy instructions and care were reviewed and questions were answered. The patient was encouraged to call [facility] for any additional concerns. The patient was referred to [facility] on September 01, 2020. Breast MRI recommended for extent. Pathology results reported by Stacie Acres RN on 08/24/2020. Electronically Signed   By: Lajean Manes M.D.   On: 08/24/2020 14:44   Result Date: 08/24/2020 CLINICAL DATA:  Patient presents for ultrasound-guided core needle biopsy of a highly suspicious, palpable mass in the right breast at 12:30 o'clock, 5 cm the nipple, a second,  indeterminate, 8 mm right breast mass at 12:30 o'clock, 1 cm from the nipple, and a right axillary lymph node with a borderline thickened cortex to 3 mm. EXAM: ULTRASOUND GUIDED RIGHT BREAST CORE NEEDLE BIOPSY ULTRASOUND GUIDED RIGHT BREAST CORE NEEDLE BIOPSY ULTRASOUND GUIDED RIGHT AXILLA CORE NEEDLE BIOPSY COMPARISON:  Previous exam(s). PROCEDURE: I met with the patient and we discussed the procedure of ultrasound-guided biopsy, including benefits and alternatives. We discussed the high likelihood of a successful procedure. We discussed the risks of the procedure, including infection, bleeding, tissue injury, clip migration, and inadequate sampling. Informed written consent was given. The usual time-out protocol was performed immediately prior to the procedure. Lesion #1: 12:30 o'clock, 5 cmfn.  2.7 cm mass. Lesion quadrant: Upper inner quadrant Using sterile technique and 1% Lidocaine as local anesthetic, under direct ultrasound visualization, a 12 gauge spring-loaded device was used to perform biopsy of the 2.7 cm mass using an inferior approach. At the conclusion of the procedure ribbon shaped tissue marker clip was deployed into the biopsy cavity. Lesion #2: 12:30 o'clock, 1 cmfn.  8 mm mass. Lesion quadrant: Upper inner quadrant Using sterile technique and 1% Lidocaine as local anesthetic, under direct ultrasound visualization, a 12 gauge spring-loaded device was used to perform biopsy of the 8 mm mass using a medial approach. At the conclusion of the procedure coil shaped tissue marker clip was deployed into the biopsy cavity. Lesion #3: Right axillary lymph node. Lesion location: Right axilla. Using sterile technique and 1% Lidocaine as local anesthetic, under direct ultrasound visualization, a 14 gauge spring-loaded device was used to perform biopsy of right axillary lymph node with borderline thickened cortex using a inferolateral approach. At the conclusion of the procedure Q shaped tissue marker clip  was deployed into the biopsy cavity. Follow up 2 view mammogram was performed and dictated separately. IMPRESSION: Ultrasound guided biopsy of 2 right breast masses and a borderline right axillary lymph node. No apparent complications. Electronically Signed: By: Lajean Manes M.D. On: 08/23/2020 09:45    Labs:  CBC: Recent Labs    09/01/20 0842  WBC 5.2  HGB 13.0  HCT 39.7  PLT 231    COAGS: No results for input(s): INR, APTT in the last 8760 hours.  BMP: Recent Labs    09/01/20 0842  NA 143  K 3.3*  CL 107  CO2 27  GLUCOSE 69*  BUN 11  CALCIUM 9.6  CREATININE 0.76  GFRNONAA >60  GFRAA >60    LIVER FUNCTION TESTS: Recent Labs    09/01/20 0842  BILITOT 0.6  AST 17  ALT 13  ALKPHOS 57  PROT 7.2  ALBUMIN 4.0    TUMOR MARKERS: No results for input(s): AFPTM, CEA, CA199, CHROMGRNA in the last 8760 hours.  Assessment and Plan: 54 y.o. female smoker with history  of newly diagnosed left breast carcinoma who presents today for Port-A-Cath placement for chemotherapy.Risks and benefits of image guided port-a-catheter placement was discussed with the patient including, but not limited to bleeding, infection, pneumothorax, or fibrin sheath development and need for additional procedures.  All of the patient's questions were answered, patient is agreeable to proceed. Consent signed and in chart.     Thank you for this interesting consult.  I greatly enjoyed meeting Amanda Rich and look forward to participating in their care.  A copy of this report was sent to the requesting provider on this date.  Electronically Signed: D. Rowe Robert, PA-C 09/10/2020, 1:40 PM   I spent a total of 25 minutes    in face to face in clinical consultation, greater than 50% of which was counseling/coordinating care for port a cath placement

## 2020-09-10 NOTE — Discharge Instructions (Signed)
For questions /concerns may call Interventional Radiology at [phone]  You may remove your dressing and shower tomorrow afternoon  DO NOT use EMLA cream for 2 weeks after port placement as the cream will remove surgical glue on your incision.     Implanted Port Insertion, Care After This sheet gives you information about how to care for yourself after your procedure. Your health care provider may also give you more specific instructions. If you have problems or questions, contact your health care provider. What can I expect after the procedure? After the procedure, it is common to have:  Discomfort at the port insertion site.  Bruising on the skin over the port. This should improve over 3-4 days. Follow these instructions at home: [facility] care  After your port is placed, you will get a manufacturer's information card. The card has information about your port. Keep this card with you at all times.  Take care of the port as told by your health care provider. Ask your health care provider if you or a family member can get training for taking care of the port at home. A home health care nurse may also take care of the port.  Make sure to remember what type of port you have. Incision care      Follow instructions from your health care provider about how to take care of your port insertion site. Make sure you: ? Wash your hands with soap and water before and after you change your bandage (dressing). If soap and water are not available, use hand sanitizer. ? Change your dressing as told by your health care provider. ? Leave stitches (sutures), skin glue, or adhesive strips in place. These skin closures may need to stay in place for 2 weeks or longer. If adhesive strip edges start to loosen and curl up, you may trim the loose edges. Do not remove adhesive strips completely unless your health care provider tells you to do that.  Check your port insertion site every day for signs of  infection. Check for: ? Redness, swelling, or pain. ? Fluid or blood. ? Warmth. ? Pus or a bad smell. Activity  Return to your normal activities as told by your health care provider. Ask your health care provider what activities are safe for you.  Do not lift anything that is heavier than 10 lb (4.5 kg), or the limit that you are told, until your health care provider says that it is safe. General instructions  Take over-the-counter and prescription medicines only as told by your health care provider.  Do not take baths, swim, or use a hot tub until your health care provider approves. Ask your health care provider if you may take showers. You may only be allowed to take sponge baths.  Do not drive for 24 hours if you were given a sedative during your procedure.  Wear a medical alert bracelet in case of an emergency. This will tell any health care providers that you have a port.  Keep all follow-up visits as told by your health care provider. This is important. Contact a health care provider if:  You cannot flush your port with saline as directed, or you cannot draw blood from the port.  You have a fever or chills.  You have redness, swelling, or pain around your port insertion site.  You have fluid or blood coming from your port insertion site.  Your port insertion site feels warm to the touch.  You have pus or a  bad smell coming from the port insertion site. Get help right away if:  You have chest pain or shortness of breath.  You have bleeding from your port that you cannot control. Summary  Take care of the port as told by your health care provider. Keep the manufacturer's information card with you at all times.  Change your dressing as told by your health care provider.  Contact a health care provider if you have a fever or chills or if you have redness, swelling, or pain around your port insertion site.  Keep all follow-up visits as told by your health care  provider.    Moderate Conscious Sedation, Adult, Care After These instructions provide you with information about caring for yourself after your procedure. Your health care provider may also give you more specific instructions. Your treatment has been planned according to current medical practices, but problems sometimes occur. Call your health care provider if you have any problems or questions after your procedure. What can I expect after the procedure? After your procedure, it is common:  To feel sleepy for several hours.  To feel clumsy and have poor balance for several hours.  To have poor judgment for several hours.  To vomit if you eat too soon. Follow these instructions at home: For at least 24 hours after the procedure:   Do not: ? Participate in activities where you could fall or become injured. ? Drive. ? Use heavy machinery. ? Drink alcohol. ? Take sleeping pills or medicines that cause drowsiness. ? Make important decisions or sign legal documents. ? Take care of children on your own.  Rest. Eating and drinking  Follow the diet recommended by your health care provider.  If you vomit: ? Drink water, juice, or soup when you can drink without vomiting. ? Make sure you have little or no nausea before eating solid foods. General instructions  Have a responsible adult stay with you until you are awake and alert.  Take over-the-counter and prescription medicines only as told by your health care provider.  If you smoke, do not smoke without supervision.  Keep all follow-up visits as told by your health care provider. This is important. Contact a health care provider if:  You keep feeling nauseous or you keep vomiting.  You feel light-headed.  You develop a rash.  You have a fever. Get help right away if:  You have trouble breathing. This information is not intended to replace advice given to you by your health care provider. Make sure you discuss any  questions you have with your health care provider. Document Revised: 11/23/2017 Document Reviewed: 04/01/2016 Elsevier Patient Education  2020 [employer].

## 2020-09-10 NOTE — Procedures (Signed)
Interventional Radiology Procedure Note  Procedure: Single Lumen Power Port Placement    Access:  Left internal jugular vein  Findings: Catheter tip positioned at cavoatrial junction. Port is ready for immediate use.   Complications: None  EBL: < 10 mL  Recommendations:  - Ok to shower in 24 hours - Do not submerge for 14 days - Routine line care    Ruthann Cancer, MD

## 2020-09-12 NOTE — Progress Notes (Signed)
Patient Care Team: Girtha Rm, NP-C as PCP - General (Family Medicine) Sueanne Margarita, MD as PCP - Cardiology (Cardiology) Rockwell Germany, RN as Oncology Nurse Navigator [name], RN as Oncology Nurse Navigator [name], MD as Consulting Physician (General Surgery) Nicholas Lose, MD as Consulting Physician (Hematology and Oncology) Gery Pray, MD as Consulting Physician (Radiation Oncology)  DIAGNOSIS:    ICD-10-CM   1. Malignant neoplasm of 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, estrogen receptor positive ([place])  C50.211    Z17.0     SUMMARY OF ONCOLOGIC HISTORY: Oncology History  Malignant neoplasm of 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, estrogen receptor positive ([place])  08/25/2020 Initial Diagnosis   Patient palpated a right breast lump x2yrand a left breast lump x215yrDiagnostic mammogram and USKoreahowed in the right breast, a 2.7cm mass 5cm from the nipple and 0.8cm mass 1cm from the nipple at the 12:30 position, with borderline cortical thickening in the right axilla, and in the left breast, a 4.0cm mass at the 11 o'clock position representing a hamartoma. 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   09/13/2020 -  Chemotherapy   The patient had dexamethasone (DECADRON) 4 MG tablet, 8 mg, Oral, Daily, 1 of 1 cycle, Start date: 09/01/2020, End date: -- palonosetron (ALOXI) injection 0.25 mg, 0.25 mg, Intravenous,  Once, 0 of 6 cycles pegfilgrastim-jmdb (FULPHILA) injection 6 mg, 6 mg, Subcutaneous,  Once, 0 of 6 cycles CARBOplatin (PARAPLATIN) in sodium chloride 0.9 % 100 mL chemo infusion, , Intravenous,  Once, 0 of 6 cycles DOCEtaxel (TAXOTERE) 120 mg in sodium chloride 0.9 % 250 mL chemo infusion, 75 mg/m2, Intravenous,  Once, 0 of 6 cycles fosaprepitant (EMEND) 150 mg in sodium chloride 0.9 % 145  mL IVPB, 150 mg, Intravenous,  Once, 0 of 6 cycles pertuzumab (PERJETA) 840 mg in sodium chloride 0.9 % 250 mL chemo infusion, 840 mg, Intravenous, Once, 0 of 6 cycles trastuzumab-anns (KANJINTI) 420 mg in sodium chloride 0.9 % 250 mL chemo infusion, 8 mg/kg = 420 mg, Intravenous,  Once, 0 of 6 cycles  for chemotherapy treatment.      CHIEF COMPLIANT: Cycle 1 TCH Perjeta  INTERVAL HISTORY: Amanda Rich a [age].o. with above-mentioned history of right breast cancer currently on neoadjuvant chemotherapy with TCConcowEcho on 09/06/20 showed an ejection fraction of 60-65%. Breast MRI on 09/08/20 showed the biopsied malignancy in the upper inner right breast measuring 3.0cm, no left breast malignancy, and no axillary adenopathy. Her port was placed on 09/10/20 by Dr. SuSerafina RoyalsShe presents to the clinic today for treatment.   ALLERGIES:  is allergic to clarithromycin.  MEDICATIONS:  Current Outpatient Medications  Medication Sig Dispense Refill  . albuterol (PROVENTIL HFA;VENTOLIN HFA) 108 (90 Base) MCG/ACT inhaler Inhale 2 puffs into the lungs every 6 (six) hours as needed for wheezing or shortness of breath. 1 Inhaler 0  . Biotin 5000 MCG CAPS Take 1 capsule by mouth daily.     . Cholecalciferol (VITAMIN D3) 2000 units TABS Take 1 tablet by mouth daily.     . citalopram (CELEXA) 20 MG tablet Take 1 tablet (20 mg total) by mouth daily. (Patient taking differently: Take 10 mg by mouth daily. ) 30 tablet 2  . Colchicine (MITIGARE) 0.6 MG CAPS Take 0.6 mg  by mouth daily. 30 capsule 2  . dexamethasone (DECADRON) 4 MG tablet Take 2 tablets (8 mg total) by mouth daily. Take 1 tablet day before chemo and 1 tablet day after chemo with food 12 tablet 0  . lidocaine-prilocaine (EMLA) cream Apply to affected area once 30 g 3  . LORazepam (ATIVAN) 0.5 MG tablet Take 1 tablet (0.5 mg total) by mouth at bedtime as needed (Nausea or vomiting). 30 tablet 0  . Multiple Vitamin (MULTIVITAMIN WITH MINERALS)  TABS Take 1 tablet by mouth daily.    . ondansetron (ZOFRAN) 8 MG tablet Take 1 tablet (8 mg total) by mouth 2 (two) times daily as needed (Nausea or vomiting). Start on the third day after chemotherapy. 30 tablet 1  . oseltamivir (TAMIFLU) 75 MG capsule Take 1 capsule (75 mg total) by mouth 2 (two) times daily. 10 capsule 0  . Prenatal Vit-DSS-Fe Cbn-FA (PRENATAL ADVANTAGE PO) Take by mouth.    . prochlorperazine (COMPAZINE) 10 MG tablet Take 1 tablet (10 mg total) by mouth every 6 (six) hours as needed (Nausea or vomiting). 30 tablet 1  . TURMERIC PO Take 1 tablet by mouth daily.      No current facility-administered medications for this visit.    PHYSICAL EXAMINATION: ECOG PERFORMANCE STATUS: 1 - Symptomatic but completely ambulatory  Vitals:   09/13/20 0902  BP: 139/85  Pulse: 70  Resp: 17  Temp: (!) 97.5 F (36.4 C)  SpO2: 100%   Filed Weights   09/13/20 0902  Weight: 115 lb 11.2 oz (52.5 kg)    LABORATORY DATA:  I have reviewed the data as listed CMP Latest Ref Rng & Units 09/01/2020 12/26/2018 12/25/2018  Glucose 70 - 99 mg/dL 69(L) 119(H) 100(H)  BUN 6 - 20 mg/dL 11 9 14   Creatinine 0.44 - 1.00 mg/dL 0.76 0.63 0.67  Sodium 135 - 145 mmol/L 143 139 142  Potassium 3.5 - 5.1 mmol/L 3.3(L) 4.1 3.4(L)  Chloride 98 - 111 mmol/L 107 107 107  CO2 22 - 32 mmol/L 27 24 26   Calcium 8.9 - 10.3 mg/dL 9.6 8.8(L) 9.3  Total Protein 6.5 - 8.1 g/dL 7.2 - -  Total Bilirubin 0.3 - 1.2 mg/dL 0.6 - -  Alkaline Phos 38 - 126 U/L 57 - -  AST 15 - 41 U/L 17 - -  ALT 0 - 44 U/L 13 - -    Lab Results  Component Value Date   WBC 5.4 09/13/2020   HGB 11.9 (L) 09/13/2020   HCT 36.4 09/13/2020   MCV 86.3 09/13/2020   PLT 193 09/13/2020   NEUTROABS 1.9 09/13/2020    ASSESSMENT & PLAN:  Malignant neoplasm of 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, estrogen receptor positive ([place]) Patient palpated a right breast lump x58yrand a left breast lump [id number] mammogram and USKoreashowed in the right breast, a 2.7cm mass 5cm from the nipple and 0.8cm mass 1cm from the nipple at the 12:30 position, with borderline cortical thickening in the right axilla, and in the left breast, a 4.0cm mass at the 11 o'clock position representing a hamartoma. 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  Treatment plan: 1. Neoadjuvant chemotherapy with TCH Perjeta 6 cycles followed by Herceptin maintenance for 1 year 2. Followed by breast conserving surgery if possible with sentinel lymph node study  3. Followed by adjuvant radiation therapy if patient had lumpectomy 4.  Followed by adjuvant antiestrogen therapy ------------------------------------------------------------------------------------------------------------------------------------------- Current treatment: Cycle 1 day 1 [place] reviewed, chemo consent obtained, chemo education completed Echocardiogram 09/06/2020: EF 60 to 65%  I renewed her prescription for Celexa. I given her a letter that she cannot work because of chemotherapy. Monitoring closely for toxicities. Return to clinic in 1 week for toxicity check    No orders of the defined types were placed in this encounter.  The patient has a good understanding of the overall plan. she agrees with it. she will call with any problems that may develop before the next visit here.  Total time spent: 30 mins including face to face time and time spent for planning, charting and coordination of care  Nicholas Lose, MD 09/13/2020  I, Cloyde Reams Dorshimer, am acting as scribe for Dr. Nicholas Lose.  I have reviewed the above documentation for accuracy and completeness, and I agree with the above.

## 2020-09-13 ENCOUNTER — Inpatient Hospital Stay: Payer: Medicaid Other

## 2020-09-13 ENCOUNTER — Other Ambulatory Visit: Payer: Self-pay

## 2020-09-13 ENCOUNTER — Encounter: Payer: Self-pay | Admitting: *Deleted

## 2020-09-13 ENCOUNTER — Inpatient Hospital Stay (HOSPITAL_BASED_OUTPATIENT_CLINIC_OR_DEPARTMENT_OTHER): Payer: Medicaid Other | Admitting: Hematology and Oncology

## 2020-09-13 ENCOUNTER — Other Ambulatory Visit: Payer: Self-pay | Admitting: Hematology and Oncology

## 2020-09-13 VITALS — BP 123/77 | HR 75 | Temp 98.1°F | Resp 16

## 2020-09-13 DIAGNOSIS — Z170 Estrogen receptor positive status [ER+]: Secondary | ICD-10-CM

## 2020-09-13 DIAGNOSIS — C50211 Malignant neoplasm of upper-inner quadrant of right female breast: Secondary | ICD-10-CM

## 2020-09-13 DIAGNOSIS — T451X5A Adverse effect of antineoplastic and immunosuppressive drugs, initial encounter: Secondary | ICD-10-CM | POA: Diagnosis not present

## 2020-09-13 DIAGNOSIS — Z5189 Encounter for other specified aftercare: Secondary | ICD-10-CM | POA: Diagnosis not present

## 2020-09-13 DIAGNOSIS — K521 Toxic gastroenteritis and colitis: Secondary | ICD-10-CM | POA: Diagnosis not present

## 2020-09-13 DIAGNOSIS — F17210 Nicotine dependence, cigarettes, uncomplicated: Secondary | ICD-10-CM | POA: Diagnosis not present

## 2020-09-13 DIAGNOSIS — C773 Secondary and unspecified malignant neoplasm of axilla and upper limb lymph nodes: Secondary | ICD-10-CM | POA: Diagnosis not present

## 2020-09-13 DIAGNOSIS — F32A Depression, unspecified: Secondary | ICD-10-CM

## 2020-09-13 DIAGNOSIS — C50111 Malignant neoplasm of central portion of right female breast: Secondary | ICD-10-CM | POA: Diagnosis not present

## 2020-09-13 DIAGNOSIS — F329 Major depressive disorder, single episode, unspecified: Secondary | ICD-10-CM | POA: Diagnosis not present

## 2020-09-13 DIAGNOSIS — Z79899 Other long term (current) drug therapy: Secondary | ICD-10-CM | POA: Diagnosis not present

## 2020-09-13 LAB — CMP (CANCER CENTER ONLY)
ALT: 30 U/L (ref 0–44)
AST: 31 U/L (ref 15–41)
Albumin: 3.7 g/dL (ref 3.5–5.0)
Alkaline Phosphatase: 57 U/L (ref 38–126)
Anion gap: 7 (ref 5–15)
BUN: 13 mg/dL (ref 6–20)
CO2: 27 mmol/L (ref 22–32)
Calcium: 9.4 mg/dL (ref 8.9–10.3)
Chloride: 106 mmol/L (ref 98–111)
Creatinine: 0.66 mg/dL (ref 0.44–1.00)
GFR, Est AFR Am: >60 mL/min (ref >60)
GFR, Estimated: >60 mL/min (ref >60)
Glucose, Bld: 78 mg/dL (ref 70–99)
Potassium: 4 mmol/L (ref 3.5–5.1)
Sodium: 140 mmol/L (ref 135–145)
Total Bilirubin: 0.5 mg/dL (ref 0.3–1.2)
Total Protein: 6.8 g/dL (ref 6.5–8.1)

## 2020-09-13 LAB — CBC WITH DIFFERENTIAL (CANCER CENTER ONLY)
Abs Immature Granulocytes: 0.01 10*3/uL (ref 0.00–0.07)
Basophils Absolute: 0 10*3/uL (ref 0.0–0.1)
Basophils Relative: 1 %
Eosinophils Absolute: 0.4 10*3/uL (ref 0.0–0.5)
Eosinophils Relative: 8 %
HCT: 36.4 % (ref 36.0–46.0)
Hemoglobin: 11.9 g/dL — ABNORMAL LOW (ref 12.0–15.0)
Immature Granulocytes: 0 %
Lymphocytes Relative: 44 %
Lymphs Abs: 2.4 10*3/uL (ref 0.7–4.0)
MCH: 28.2 pg (ref 26.0–34.0)
MCHC: 32.7 g/dL (ref 30.0–36.0)
MCV: 86.3 fL (ref 80.0–100.0)
Monocytes Absolute: 0.6 10*3/uL (ref 0.1–1.0)
Monocytes Relative: 11 %
Neutro Abs: 1.9 10*3/uL (ref 1.7–7.7)
Neutrophils Relative %: 36 %
Platelet Count: 193 10*3/uL (ref 150–400)
RBC: 4.22 MIL/uL (ref 3.87–5.11)
RDW: 13.2 % (ref 11.5–15.5)
WBC Count: 5.4 10*3/uL (ref 4.0–10.5)
nRBC: 0 % (ref 0.0–0.2)

## 2020-09-13 MED ORDER — SODIUM CHLORIDE 0.9 % IV SOLN
420.0000 mg | Freq: Once | INTRAVENOUS | Status: AC
  Administered 2020-09-13: 420 mg via INTRAVENOUS
  Filled 2020-09-13: qty 14

## 2020-09-13 MED ORDER — SODIUM CHLORIDE 0.9 % IV SOLN
75.0000 mg/m2 | Freq: Once | INTRAVENOUS | Status: AC
  Administered 2020-09-13: 120 mg via INTRAVENOUS
  Filled 2020-09-13: qty 12

## 2020-09-13 MED ORDER — HEPARIN SOD (PORK) LOCK FLUSH 100 UNIT/ML IV SOLN
500.0000 [IU] | Freq: Once | INTRAVENOUS | Status: AC | PRN
  Administered 2020-09-13: 500 [IU]
  Filled 2020-09-13: qty 5

## 2020-09-13 MED ORDER — SODIUM CHLORIDE 0.9 % IV SOLN
560.0000 mg | Freq: Once | INTRAVENOUS | Status: AC
  Administered 2020-09-13: 560 mg via INTRAVENOUS
  Filled 2020-09-13: qty 56

## 2020-09-13 MED ORDER — SODIUM CHLORIDE 0.9 % IV SOLN
Freq: Once | INTRAVENOUS | Status: AC
  Filled 2020-09-13: qty 250

## 2020-09-13 MED ORDER — PALONOSETRON HCL INJECTION 0.25 MG/5ML
0.2500 mg | Freq: Once | INTRAVENOUS | Status: AC
  Administered 2020-09-13: 0.25 mg via INTRAVENOUS

## 2020-09-13 MED ORDER — ACETAMINOPHEN 325 MG PO TABS
ORAL_TABLET | ORAL | Status: AC
  Filled 2020-09-13: qty 2

## 2020-09-13 MED ORDER — SODIUM CHLORIDE 0.9% FLUSH
10.0000 mL | INTRAVENOUS | Status: DC | PRN
  Administered 2020-09-13: 10 mL
  Filled 2020-09-13: qty 10

## 2020-09-13 MED ORDER — DIPHENHYDRAMINE HCL 25 MG PO CAPS
ORAL_CAPSULE | ORAL | Status: AC
  Filled 2020-09-13: qty 2

## 2020-09-13 MED ORDER — TRASTUZUMAB-ANNS CHEMO 150 MG IV SOLR
8.0000 mg/kg | Freq: Once | INTRAVENOUS | Status: AC
  Administered 2020-09-13: 420 mg via INTRAVENOUS
  Filled 2020-09-13: qty 20

## 2020-09-13 MED ORDER — SODIUM CHLORIDE 0.9% FLUSH
10.0000 mL | Freq: Once | INTRAVENOUS | Status: AC
  Administered 2020-09-13: 10 mL
  Filled 2020-09-13: qty 10

## 2020-09-13 MED ORDER — DIPHENHYDRAMINE HCL 25 MG PO CAPS
50.0000 mg | ORAL_CAPSULE | Freq: Once | ORAL | Status: AC
  Administered 2020-09-13: 50 mg via ORAL

## 2020-09-13 MED ORDER — PALONOSETRON HCL INJECTION 0.25 MG/5ML
INTRAVENOUS | Status: AC
  Filled 2020-09-13: qty 5

## 2020-09-13 MED ORDER — SODIUM CHLORIDE 0.9 % IV SOLN
10.0000 mg | Freq: Once | INTRAVENOUS | Status: AC
  Administered 2020-09-13: 10 mg via INTRAVENOUS
  Filled 2020-09-13: qty 10

## 2020-09-13 MED ORDER — SODIUM CHLORIDE 0.9 % IV SOLN
150.0000 mg | Freq: Once | INTRAVENOUS | Status: AC
  Administered 2020-09-13: 150 mg via INTRAVENOUS
  Filled 2020-09-13: qty 150

## 2020-09-13 MED ORDER — ACETAMINOPHEN 325 MG PO TABS
650.0000 mg | ORAL_TABLET | Freq: Once | ORAL | Status: AC
  Administered 2020-09-13: 650 mg via ORAL

## 2020-09-13 MED ORDER — CITALOPRAM HYDROBROMIDE 10 MG PO TABS: 10.0000 mg | ORAL_TABLET | Freq: Every day | ORAL | 3 refills | Status: DC

## 2020-09-13 NOTE — Patient Instructions (Addendum)
[place] Discharge Instructions for Patients Receiving Chemotherapy  Today you received the following chemotherapy agents: Trastuzumab, Pertuzumab, Docetaxel, and Carboplatin.  To help prevent nausea and vomiting after your treatment, we encourage you to take your nausea medication as prescribed.    If you develop nausea and vomiting that is not controlled by your nausea medication, call the clinic.   BELOW ARE SYMPTOMS THAT SHOULD BE REPORTED IMMEDIATELY:  *FEVER GREATER THAN 100.5 F  *CHILLS WITH OR WITHOUT FEVER  NAUSEA AND VOMITING THAT IS NOT CONTROLLED WITH YOUR NAUSEA MEDICATION  *UNUSUAL SHORTNESS OF BREATH  *UNUSUAL BRUISING OR BLEEDING  TENDERNESS IN MOUTH AND THROAT WITH OR WITHOUT PRESENCE OF ULCERS  *URINARY PROBLEMS  *BOWEL PROBLEMS  UNUSUAL RASH Items with * indicate a potential emergency and should be followed up as soon as possible.  Feel free to call the clinic should you have any questions or concerns. The clinic phone number is [phone].  Please show the [place] at check-in to the Emergency Department and triage nurse.   Implanted [facility] Guide An implanted port is a device that is placed under the skin. It is usually placed in the chest. The device can be used to give IV medicine, to take blood, or for dialysis. You may have an implanted port if:  You need IV medicine that would be irritating to the small veins in your hands or arms.  You need IV medicines, such as antibiotics, for a long period of time.  You need IV nutrition for a long period of time.  You need dialysis. Having a port means that your health care provider will not need to use the veins in your arms for these procedures. You may have fewer limitations when using a port than you would if you used other types of long-term IVs, and you will likely be able to return to normal activities after your incision heals. An implanted port has two main  parts:  Reservoir. The reservoir is the part where a needle is inserted to give medicines or draw blood. The reservoir is round. After it is placed, it appears as a small, raised area under your skin.  Catheter. The catheter is a thin, flexible tube that connects the reservoir to a vein. Medicine that is inserted into the reservoir goes into the catheter and then into the vein. How is my port accessed? To access your port:  A numbing cream may be placed on the skin over the port site.  Your health care provider will put on a mask and sterile gloves.  The skin over your port will be cleaned carefully with a germ-killing soap and allowed to dry.  Your health care provider will gently pinch the port and insert a needle into it.  Your health care provider will check for a blood return to make sure the port is in the vein and is not clogged.  If your port needs to remain accessed to get medicine continuously (constant infusion), your health care provider will place a clear bandage (dressing) over the needle site. The dressing and needle will need to be changed every week, or as told by your health care provider. What is flushing? Flushing helps keep the port from getting clogged. Follow instructions from your health care provider about how and when to flush the port. Ports are usually flushed with saline solution or a medicine called heparin. The need for flushing will depend on how the port is used:  If the port is only used from time to time to give medicines or draw blood, the port may need to be flushed: ? Before and after medicines have been given. ? Before and after blood has been drawn. ? As part of routine maintenance. Flushing may be recommended every 4-6 weeks.  If a constant infusion is running, the port may not need to be flushed.  Throw away any syringes in a disposal container that is meant for sharp items (sharps container). You can buy a sharps container from a pharmacy, or  you can make one by using an empty hard plastic bottle with a cover. How long will my port stay implanted? The port can stay in for as long as your health care provider thinks it is needed. When it is time for the port to come out, a surgery will be done to remove it. The surgery will be similar to the procedure that was done to put the port in. Follow these instructions at home:   Flush your port as told by your health care provider.  If you need an infusion over several days, follow instructions from your health care provider about how to take care of your port site. Make sure you: ? Wash your hands with soap and water before you change your dressing. If soap and water are not available, use alcohol-based hand sanitizer. ? Change your dressing as told by your health care provider. ? Place any used dressings or infusion bags into a plastic bag. Throw that bag in the trash. ? Keep the dressing that covers the needle clean and dry. Do not get it wet. ? Do not use scissors or sharp objects near the tube. ? Keep the tube clamped, unless it is being used.  Check your port site every day for signs of infection. Check for: ? Redness, swelling, or pain. ? Fluid or blood. ? Pus or a bad smell.  Protect the skin around the port site. ? Avoid wearing bra straps that rub or irritate the site. ? Protect the skin around your port from seat belts. Place a soft pad over your chest if needed.  Bathe or shower as told by your health care provider. The site may get wet as long as you are not actively receiving an infusion.  Return to your normal activities as told by your health care provider. Ask your health care provider what activities are safe for you.  Carry a medical alert card or wear a medical alert bracelet at all times. This will let health care providers know that you have an implanted port in case of an emergency. Get help right away if:  You have redness, swelling, or pain at the port  site.  You have fluid or blood coming from your port site.  You have pus or a bad smell coming from the port site.  You have a fever. Summary  Implanted ports are usually placed in the chest for long-term IV access.  Follow instructions from your health care provider about flushing the port and changing bandages (dressings).  Take care of the area around your port by avoiding clothing that puts pressure on the area, and by watching for signs of infection.  Protect the skin around your port from seat belts. Place a soft pad over your chest if needed.  Get help right away if you have a fever or you have redness, swelling, pain, drainage, or a bad smell at the port site. This information is not  intended to replace advice given to you by your health care provider. Make sure you discuss any questions you have with your health care provider. Document Revised: 04/04/2019 Document Reviewed: 01/13/2017 Elsevier Patient Education  2020 [employer].

## 2020-09-13 NOTE — Assessment & Plan Note (Signed)
Patient palpated a right breast lump x17yrand a left breast lump x295yrDiagnostic mammogram and USKoreahowed in the right breast, a 2.7cm mass 5cm from the nipple and 0.8cm mass 1cm from the nipple at the 12:30 position, with borderline cortical thickening in the right axilla, and in the left breast, a 4.0cm mass at the 11 o'clock position representing a hamartoma. 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  Treatment plan: 1. Neoadjuvant chemotherapy with TCH Perjeta 6 cycles followed by Herceptin maintenance for 1 year 2. Followed by breast conserving surgery if possible with sentinel lymph node study 3. Followed by adjuvant radiation therapy if patient had lumpectomy 4.  Followed by adjuvant antiestrogen therapy ------------------------------------------------------------------------------------------------------------------------------------------- Current treatment: Cycle 1 day 1 TCFox [place], chemo consent obtained, chemo education completed Echocardiogram 09/06/2020: EF 60 to 65%  Return to clinic in 1 week for toxicity check

## 2020-09-13 NOTE — Patient Instructions (Signed)

## 2020-09-14 ENCOUNTER — Telehealth: Payer: Self-pay | Admitting: Hematology and Oncology

## 2020-09-14 ENCOUNTER — Encounter: Payer: Self-pay | Admitting: Radiology

## 2020-09-14 ENCOUNTER — Telehealth: Payer: Self-pay | Admitting: Licensed Clinical Social Worker

## 2020-09-14 ENCOUNTER — Telehealth: Payer: Self-pay | Admitting: *Deleted

## 2020-09-14 DIAGNOSIS — Z170 Estrogen receptor positive status [ER+]: Secondary | ICD-10-CM

## 2020-09-14 DIAGNOSIS — C50211 Malignant neoplasm of upper-inner quadrant of right female breast: Secondary | ICD-10-CM

## 2020-09-14 NOTE — Research (Signed)
S1714, A PROSPECTIVE OBSERVATIONAL COHORT STUDY TO DEVELOP A PREDICTIVE MODEL OFTAXANE-INDUCED PERIPHERAL NEUROPATHY IN CANCER PATIENTS.  09/14/20      10:50AM  SCREEN FAILURE: Reached out to patient multiple times over the past week and a half, but unfortunately timing did not work out for patient to sign consent prior to initial treatment. Patient started treatment on Monday 09/13/20. I filled out a screen failure form and gave to [place], Remer Macho.  Carol Ada, RT(R)(T) Clinical Research Coordinator

## 2020-09-14 NOTE — Telephone Encounter (Signed)
No 9/21 los. No changes made to pt's schedule

## 2020-09-14 NOTE — Telephone Encounter (Signed)
CSW received return call from patient. She was working for [employer] but unable to be placed now due to amount of appointments and currently in chemo. Worried about mortgage & utilities.   Resources in place: [facility] Medicaid, medication coverage, [facility] transportation, food stamps  Pending: pt started applications for CARES act (utilties help), mortgage assistance, and working with bank. Started disability application although early stage cancer currently.   CSW informed patient about [place] assistance applications. Pt will pick up applications tomorrow.  Follow-up: CSW will meet with pt 9/27 to discuss any questions on applications and to provide next [employer].   Edwinna Areola Kiahna Banghart, LCSW

## 2020-09-14 NOTE — Telephone Encounter (Signed)
[place] Clinical Social Work  Attempted to contact patient to follow-up on resources. No answer. Left VM with direct contact information.  CSW will also attempt to see patient during appts on 09/20/20 to provide next [place] as well as applications for breast cancer foundations.   Edwinna Areola Jalicia Roszak, LCSW

## 2020-09-15 ENCOUNTER — Inpatient Hospital Stay: Payer: Medicaid Other

## 2020-09-15 ENCOUNTER — Other Ambulatory Visit: Payer: Self-pay

## 2020-09-15 ENCOUNTER — Telehealth: Payer: Self-pay

## 2020-09-15 VITALS — BP 140/97 | HR 72 | Temp 97.9°F | Resp 18

## 2020-09-15 DIAGNOSIS — F17210 Nicotine dependence, cigarettes, uncomplicated: Secondary | ICD-10-CM | POA: Diagnosis not present

## 2020-09-15 DIAGNOSIS — C50111 Malignant neoplasm of central portion of right female breast: Secondary | ICD-10-CM | POA: Diagnosis not present

## 2020-09-15 DIAGNOSIS — K521 Toxic gastroenteritis and colitis: Secondary | ICD-10-CM | POA: Diagnosis not present

## 2020-09-15 DIAGNOSIS — Z79899 Other long term (current) drug therapy: Secondary | ICD-10-CM | POA: Diagnosis not present

## 2020-09-15 DIAGNOSIS — Z170 Estrogen receptor positive status [ER+]: Secondary | ICD-10-CM

## 2020-09-15 DIAGNOSIS — C773 Secondary and unspecified malignant neoplasm of axilla and upper limb lymph nodes: Secondary | ICD-10-CM | POA: Diagnosis not present

## 2020-09-15 DIAGNOSIS — Z5189 Encounter for other specified aftercare: Secondary | ICD-10-CM | POA: Diagnosis not present

## 2020-09-15 DIAGNOSIS — F329 Major depressive disorder, single episode, unspecified: Secondary | ICD-10-CM | POA: Diagnosis not present

## 2020-09-15 DIAGNOSIS — C50211 Malignant neoplasm of upper-inner quadrant of right female breast: Secondary | ICD-10-CM

## 2020-09-15 DIAGNOSIS — T451X5A Adverse effect of antineoplastic and immunosuppressive drugs, initial encounter: Secondary | ICD-10-CM | POA: Diagnosis not present

## 2020-09-15 MED ORDER — PEGFILGRASTIM-JMDB 6 MG/0.6ML ~~LOC~~ SOSY: 6.0000 mg | PREFILLED_SYRINGE | Freq: Once | SUBCUTANEOUS | Status: DC

## 2020-09-15 MED ORDER — PEGFILGRASTIM-CBQV 6 MG/0.6ML ~~LOC~~ SOSY
6.0000 mg | PREFILLED_SYRINGE | Freq: Once | SUBCUTANEOUS | Status: AC
  Administered 2020-09-15: 6 mg via SUBCUTANEOUS

## 2020-09-15 MED ORDER — PEGFILGRASTIM-JMDB 6 MG/0.6ML ~~LOC~~ SOSY
PREFILLED_SYRINGE | SUBCUTANEOUS | Status: AC
  Filled 2020-09-15: qty 0.6

## 2020-09-15 MED ORDER — PEGFILGRASTIM-CBQV 6 MG/0.6ML ~~LOC~~ SOSY
PREFILLED_SYRINGE | SUBCUTANEOUS | Status: AC
  Filled 2020-09-15: qty 0.6

## 2020-09-15 NOTE — Patient Instructions (Signed)
Pegfilgrastim (Udenyca) injection What is this medicine? PEGFILGRASTIM (PEG fil gra stim) is a long-acting granulocyte colony-stimulating factor that stimulates the growth of neutrophils, a type of white blood cell important in the body's fight against infection. It is used to reduce the incidence of fever and infection in patients with certain types of cancer who are receiving chemotherapy that affects the bone marrow, and to increase survival after being exposed to high doses of radiation. This medicine may be used for other purposes; ask your health care provider or pharmacist if you have questions. COMMON BRAND NAME(S): Fulphila, Neulasta, UDENYCA, Ziextenzo What should I tell my health care provider before I take this medicine? They need to know if you have any of these conditions:  kidney disease  latex allergy  ongoing radiation therapy  sickle cell disease  skin reactions to acrylic adhesives (On-Body Injector only)  an unusual or allergic reaction to pegfilgrastim, filgrastim, other medicines, foods, dyes, or preservatives  pregnant or trying to get pregnant  breast-feeding How should I use this medicine? This medicine is for injection under the skin. If you get this medicine at home, you will be taught how to prepare and give the pre-filled syringe or how to use the On-body Injector. Refer to the patient Instructions for Use for detailed instructions. Use exactly as directed. Tell your healthcare provider immediately if you suspect that the On-body Injector may not have performed as intended or if you suspect the use of the On-body Injector resulted in a missed or partial dose. It is important that you put your used needles and syringes in a special sharps container. Do not put them in a trash can. If you do not have a sharps container, call your pharmacist or healthcare provider to get one. Talk to your pediatrician regarding the use of this medicine in children. While this drug  may be prescribed for selected conditions, precautions do apply. Overdosage: If you think you have taken too much of this medicine contact a poison control center or emergency room at once. NOTE: This medicine is only for you. Do not share this medicine with others. What if I miss a dose? It is important not to miss your dose. Call your doctor or health care professional if you miss your dose. If you miss a dose due to an On-body Injector failure or leakage, a new dose should be administered as soon as possible using a single prefilled syringe for manual use. What may interact with this medicine? Interactions have not been studied. Give your health care provider a list of all the medicines, herbs, non-prescription drugs, or dietary supplements you use. Also tell them if you smoke, drink alcohol, or use illegal drugs. Some items may interact with your medicine. This list may not describe all possible interactions. Give your health care provider a list of all the medicines, herbs, non-prescription drugs, or dietary supplements you use. Also tell them if you smoke, drink alcohol, or use illegal drugs. Some items may interact with your medicine. What should I watch for while using this medicine? You may need blood work done while you are taking this medicine. If you are going to need a MRI, CT scan, or other procedure, tell your doctor that you are using this medicine (On-Body Injector only). What side effects may I notice from receiving this medicine? Side effects that you should report to your doctor or health care professional as soon as possible:  allergic reactions like skin rash, itching or hives, swelling of   the face, lips, or tongue  back pain  dizziness  fever  pain, redness, or irritation at site where injected  pinpoint red spots on the skin  red or dark-brown urine  shortness of breath or breathing problems  stomach or side pain, or pain at the  shoulder  swelling  tiredness  trouble passing urine or change in the amount of urine Side effects that usually do not require medical attention (report to your doctor or health care professional if they continue or are bothersome):  bone pain  muscle pain This list may not describe all possible side effects. Call your doctor for medical advice about side effects. You may report side effects to FDA at 1-800-FDA-1088. Where should I keep my medicine? Keep out of the reach of children. If you are using this medicine at home, you will be instructed on how to store it. Throw away any unused medicine after the expiration date on the label. NOTE: This sheet is a summary. It may not cover all possible information. If you have questions about this medicine, talk to your doctor, pharmacist, or health care provider.  2020 Elsevier/Gold Standard (2018-03-18 16:57:08)  

## 2020-09-15 NOTE — Telephone Encounter (Signed)
Patient informed [place] Medicaid has been approved, effective dates 07/25/2020-07/24/2021. Medicaid ID # [id number] O.

## 2020-09-17 ENCOUNTER — Other Ambulatory Visit: Payer: No Typology Code available for payment source

## 2020-09-19 NOTE — Progress Notes (Signed)
Patient Care Team: Girtha Rm, NP-C as PCP - General (Family Medicine) Sueanne Margarita, MD as PCP - Cardiology (Cardiology) Rockwell Germany, RN as Oncology Nurse Navigator [name], RN as Oncology Nurse Navigator [name], MD as Consulting Physician (General Surgery) Nicholas Lose, MD as Consulting Physician (Hematology and Oncology) Gery Pray, MD as Consulting Physician (Radiation Oncology)  DIAGNOSIS:    ICD-10-CM   1. Malignant neoplasm of 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, estrogen receptor positive ([place])  C50.211    Z17.0     SUMMARY OF ONCOLOGIC HISTORY: Oncology History  Malignant neoplasm of 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, estrogen receptor positive ([place])  08/25/2020 Initial Diagnosis   Patient palpated a right breast lump x10yrand a left breast lump x230yrDiagnostic mammogram and [place]ore[place]owed in the right breast, a 2.7cm mass 5cm from the nipple and 0.8cm mass 1cm from the nipple at the 12:30 position, with borderline cortical thickening in the right axilla, and in the left breast, a 4.0cm mass at the 11 o'clock position representing a hamartoma. 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   09/13/2020 -  Chemotherapy   The patient had dexamethasone (DECADRON) 4 MG tablet, 8 mg, Oral, Daily, 1 of 1 cycle, Start date: 09/01/2020, End date: -- palonosetron (ALOXI) injection 0.25 mg, 0.25 mg, Intravenous,  Once, 1 of 6 cycles Administration: 0.25 mg (09/13/2020) pegfilgrastim-jmdb (FULPHILA) injection 6 mg, 6 mg, Subcutaneous,  Once, 1 of 1 cycle pegfilgrastim-cbqv (UDENYCA) injection 6 mg, 6 mg, Subcutaneous, Once, 1 of 6 cycles Administration: 6 mg (09/15/2020) CARBOplatin (PARAPLATIN) 560 mg in sodium chloride 0.9 % 250 mL chemo infusion, 559.8 mg (100 % of original dose 559.8 mg),  Intravenous,  Once, 1 of 6 cycles Dose modification:   (original dose 559.8 mg, Cycle 1) Administration: 560 mg (09/13/2020) DOCEtaxel (TAXOTERE) 120 mg in sodium chloride 0.9 % 250 mL chemo infusion, 75 mg/m2 = 120 mg, Intravenous,  Once, 1 of 6 cycles Administration: 120 mg (09/13/2020) fosaprepitant (EMEND) 150 mg in sodium chloride 0.9 % 145 mL IVPB, 150 mg, Intravenous,  Once, 1 of 6 cycles Administration: 150 mg (09/13/2020) pertuzumab (PERJETA) 420 mg in sodium chloride 0.9 % 250 mL chemo infusion, 420 mg (100 % of original dose 420 mg), Intravenous, Once, 1 of 6 cycles Dose modification: 420 mg (original dose 420 mg, Cycle 1, Reason: Provider Judgment) Administration: 420 mg (09/13/2020) trastuzumab-anns (KANJINTI) 420 mg in sodium chloride 0.9 % 250 mL chemo infusion, 8 mg/kg = 420 mg, Intravenous,  Once, 1 of 6 cycles Administration: 420 mg (09/13/2020)  for chemotherapy treatment.      CHIEF COMPLIANT: Cycle 1 Day 8 TCH Perjeta  INTERVAL HISTORY: TyMAURISHA MONGEAUs a [age].o. with above-mentioned history of right breast cancer currently on neoadjuvant chemotherapy with TCH Perjeta. She presents to the clinic todayfor a toxicity check following cycle 1.   ALLERGIES:  is allergic to clarithromycin.  MEDICATIONS:  Current Outpatient Medications  Medication Sig Dispense Refill  . albuterol (PROVENTIL HFA;VENTOLIN HFA) 108 (90 Base) MCG/ACT inhaler Inhale 2 puffs into the lungs every 6 (six) hours as needed for wheezing or shortness of breath. 1 Inhaler 0  . Biotin 5000 MCG CAPS Take 1 capsule by mouth daily.     . Cholecalciferol (VITAMIN D3) 2000 units TABS  Take 1 tablet by mouth daily.     . citalopram (CELEXA) 10 MG tablet Take 1 tablet (10 mg total) by mouth daily. 90 tablet 3  . dexamethasone (DECADRON) 4 MG tablet Take 2 tablets (8 mg total) by mouth daily. Take 1 tablet day before chemo and 1 tablet day after chemo with food 12 tablet 0  . lidocaine-prilocaine (EMLA) cream  Apply to affected area once 30 g 3  . LORazepam (ATIVAN) 0.5 MG tablet Take 1 tablet (0.5 mg total) by mouth at bedtime as needed (Nausea or vomiting). 30 tablet 0  . Multiple Vitamin (MULTIVITAMIN WITH MINERALS) TABS Take 1 tablet by mouth daily.    . ondansetron (ZOFRAN) 8 MG tablet Take 1 tablet (8 mg total) by mouth 2 (two) times daily as needed (Nausea or vomiting). Start on the third day after chemotherapy. 30 tablet 1  . oseltamivir (TAMIFLU) 75 MG capsule Take 1 capsule (75 mg total) by mouth 2 (two) times daily. 10 capsule 0  . Prenatal Vit-DSS-Fe Cbn-FA (PRENATAL ADVANTAGE PO) Take by mouth.    . prochlorperazine (COMPAZINE) 10 MG tablet Take 1 tablet (10 mg total) by mouth every 6 (six) hours as needed (Nausea or vomiting). 30 tablet 1  . TURMERIC PO Take 1 tablet by mouth daily.      No current facility-administered medications for this visit.    PHYSICAL EXAMINATION: ECOG PERFORMANCE STATUS: 1 - Symptomatic but completely ambulatory  Vitals:   09/20/20 1431  BP: 110/81  Pulse: (!) 101  Resp: 19  Temp: 97.9 F (36.6 C)  SpO2: 100%   Filed Weights   09/20/20 1431  Weight: 114 lb 11.2 oz (52 kg)    LABORATORY DATA:  I have reviewed the data as listed CMP Latest Ref Rng & Units 09/13/2020 09/01/2020 12/26/2018  Glucose 70 - 99 mg/dL 78 69(L) 119(H)  BUN 6 - 20 mg/dL _0 Creatinine 0.44 - 1.00 mg/dL 0.66 0.76 0.63  Sodium 135 - 145 mmol/L 140 143 139  Potassium 3.5 - 5.1 mmol/L 4.0 3.3(L) 4.1  Chloride 98 - 111 mmol/L 106 107 107  CO2 22 - 32 mmol/L _1 Calcium 8.9 - 10.3 mg/dL 9.4 9.6 8.8(L)  Total Protein 6.5 - 8.1 g/dL 6.8 7.2 -  Total Bilirubin 0.3 - 1.2 mg/dL 0.5 0.6 -  Alkaline Phos 38 - 126 U/L 57 57 -  AST 15 - 41 U/L 31 17 -  ALT 0 - 44 U/L 30 13 -    Lab Results  Component Value Date   WBC 18.4 (H) 09/20/2020   HGB 11.5 (L) 09/20/2020   HCT 36.0 09/20/2020   MCV 86.1 09/20/2020   PLT 174 09/20/2020   NEUTROABS PENDING 09/20/2020     ASSESSMENT & PLAN:  Malignant neoplasm of 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, estrogen receptor positive ([place]) Patient palpated a right breast lump x91yrand a left breast lump x258yrDiagnostic mammogram and USKoreahowed in the right breast, a 2.7cm mass 5cm from the nipple and 0.8cm mass 1cm from the nipple at the 12:30 position, with borderline cortical thickening in the right axilla, and in the left breast, a 4.0cm mass at the 11 o'clock position representing a hamartoma. 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  Treatment plan: 1. [facility] cycles followed by Herceptin maintenance for 1 year 2. Followed by breast conserving surgery if possible with sentinel lymph node study 3. Followed by adjuvant radiation therapy if patient had lumpectomy 4.Followed by adjuvant antiestrogen therapy ------------------------------------------------------------------------------------------------------------------------------------------- Current treatment: Cycle 1 day 8 TCH Perjeta Echocardiogram 09/06/2020: EF 60 to 65%  Chemo toxicities: 1.  Diarrhea: 2 to 3 days responded to Imodium 2. nausea and vomiting: 2 to 3 days started on day 4, responded to Compazine and Zofran 3.  Fatigue 4.  Severe scalp tenderness: She shaved her head and this is much better. 5.  Lack of taste and appetite: Unfortunately this is limiting her food choices.  She wants to see the dietitian next time.   Monitoring closely for toxicities. Return to clinic in 2 weeks for cycle 2    No orders of the defined types were placed in this encounter.  The patient has a good understanding of the overall plan. she agrees with it. she will call with any problems that may develop before the next visit here.  Total time spent: 30 mins  including face to face time and time spent for planning, charting and coordination of care  Nicholas Lose, MD 09/20/2020  I, Cloyde Reams Dorshimer, am acting as scribe for Dr. Nicholas Lose.  I have reviewed the above documentation for accuracy and completeness, and I agree with the above.

## 2020-09-20 ENCOUNTER — Other Ambulatory Visit: Payer: Self-pay

## 2020-09-20 ENCOUNTER — Encounter: Payer: Self-pay | Admitting: Licensed Clinical Social Worker

## 2020-09-20 ENCOUNTER — Inpatient Hospital Stay (HOSPITAL_BASED_OUTPATIENT_CLINIC_OR_DEPARTMENT_OTHER): Payer: Medicaid Other | Admitting: Hematology and Oncology

## 2020-09-20 ENCOUNTER — Inpatient Hospital Stay: Payer: Medicaid Other | Admitting: Licensed Clinical Social Worker

## 2020-09-20 ENCOUNTER — Encounter: Payer: Self-pay | Admitting: *Deleted

## 2020-09-20 ENCOUNTER — Inpatient Hospital Stay: Payer: Medicaid Other

## 2020-09-20 DIAGNOSIS — T451X5A Adverse effect of antineoplastic and immunosuppressive drugs, initial encounter: Secondary | ICD-10-CM | POA: Diagnosis not present

## 2020-09-20 DIAGNOSIS — C773 Secondary and unspecified malignant neoplasm of axilla and upper limb lymph nodes: Secondary | ICD-10-CM | POA: Diagnosis not present

## 2020-09-20 DIAGNOSIS — Z170 Estrogen receptor positive status [ER+]: Secondary | ICD-10-CM

## 2020-09-20 DIAGNOSIS — K521 Toxic gastroenteritis and colitis: Secondary | ICD-10-CM | POA: Diagnosis not present

## 2020-09-20 DIAGNOSIS — Z5189 Encounter for other specified aftercare: Secondary | ICD-10-CM | POA: Diagnosis not present

## 2020-09-20 DIAGNOSIS — C50111 Malignant neoplasm of central portion of right female breast: Secondary | ICD-10-CM | POA: Diagnosis not present

## 2020-09-20 DIAGNOSIS — C50211 Malignant neoplasm of upper-inner quadrant of right female breast: Secondary | ICD-10-CM

## 2020-09-20 DIAGNOSIS — Z79899 Other long term (current) drug therapy: Secondary | ICD-10-CM | POA: Diagnosis not present

## 2020-09-20 DIAGNOSIS — Z95828 Presence of other vascular implants and grafts: Secondary | ICD-10-CM

## 2020-09-20 DIAGNOSIS — F17210 Nicotine dependence, cigarettes, uncomplicated: Secondary | ICD-10-CM | POA: Diagnosis not present

## 2020-09-20 DIAGNOSIS — F329 Major depressive disorder, single episode, unspecified: Secondary | ICD-10-CM | POA: Diagnosis not present

## 2020-09-20 LAB — CBC WITH DIFFERENTIAL (CANCER CENTER ONLY)
Abs Immature Granulocytes: 2.29 10*3/uL — ABNORMAL HIGH (ref 0.00–0.07)
Basophils Absolute: 0 10*3/uL (ref 0.0–0.1)
Basophils Relative: 0 %
Eosinophils Absolute: 0 10*3/uL (ref 0.0–0.5)
Eosinophils Relative: 0 %
HCT: 36 % (ref 36.0–46.0)
Hemoglobin: 11.5 g/dL — ABNORMAL LOW (ref 12.0–15.0)
Immature Granulocytes: 12 %
Lymphocytes Relative: 17 %
Lymphs Abs: 3.2 10*3/uL (ref 0.7–4.0)
MCH: 27.5 pg (ref 26.0–34.0)
MCHC: 31.9 g/dL (ref 30.0–36.0)
MCV: 86.1 fL (ref 80.0–100.0)
Monocytes Absolute: 3.5 10*3/uL — ABNORMAL HIGH (ref 0.1–1.0)
Monocytes Relative: 19 %
Neutro Abs: 9.4 10*3/uL — ABNORMAL HIGH (ref 1.7–7.7)
Neutrophils Relative %: 52 %
Platelet Count: 174 10*3/uL (ref 150–400)
RBC: 4.18 MIL/uL (ref 3.87–5.11)
RDW: 13.3 % (ref 11.5–15.5)
WBC Count: 18.4 10*3/uL — ABNORMAL HIGH (ref 4.0–10.5)
nRBC: 0 % (ref 0.0–0.2)

## 2020-09-20 LAB — CMP (CANCER CENTER ONLY)
ALT: 67 U/L — ABNORMAL HIGH (ref 0–44)
AST: 33 U/L (ref 15–41)
Albumin: 3.6 g/dL (ref 3.5–5.0)
Alkaline Phosphatase: 81 U/L (ref 38–126)
Anion gap: 4 — ABNORMAL LOW (ref 5–15)
BUN: 14 mg/dL (ref 6–20)
CO2: 31 mmol/L (ref 22–32)
Calcium: 9.8 mg/dL (ref 8.9–10.3)
Chloride: 105 mmol/L (ref 98–111)
Creatinine: 0.67 mg/dL (ref 0.44–1.00)
GFR, Est AFR Am: >60 mL/min (ref >60)
GFR, Estimated: >60 mL/min (ref >60)
Glucose, Bld: 105 mg/dL — ABNORMAL HIGH (ref 70–99)
Potassium: 3.8 mmol/L (ref 3.5–5.1)
Sodium: 140 mmol/L (ref 135–145)
Total Bilirubin: 0.2 mg/dL — ABNORMAL LOW (ref 0.3–1.2)
Total Protein: 7.2 g/dL (ref 6.5–8.1)

## 2020-09-20 MED ORDER — SODIUM CHLORIDE 0.9% FLUSH
10.0000 mL | INTRAVENOUS | Status: DC | PRN
  Administered 2020-09-20: 10 mL via INTRAVENOUS
  Filled 2020-09-20: qty 10

## 2020-09-20 MED ORDER — HEPARIN SOD (PORK) LOCK FLUSH 100 UNIT/ML IV SOLN
500.0000 [IU] | Freq: Once | INTRAVENOUS | Status: AC
  Administered 2020-09-20: 500 [IU] via INTRAVENOUS
  Filled 2020-09-20: qty 5

## 2020-09-20 NOTE — Assessment & Plan Note (Signed)
Patient palpated a right breast lump x69yrand a left breast lump x279yrDiagnostic mammogram and USKoreahowed in the right breast, a 2.7cm mass 5cm from the nipple and 0.8cm mass 1cm from the nipple at the 12:30 position, with borderline cortical thickening in the right axilla, and in the left breast, a 4.0cm mass at the 11 o'clock position representing a hamartoma. 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  Treatment plan: 1. Neoadjuvant chemotherapy with TCH Perjeta 6 cycles followed by Herceptin maintenance for 1 year 2. Followed by breast conserving surgery if possible with sentinel lymph node study 3. Followed by adjuvant radiation therapy if patient had lumpectomy 4.Followed by adjuvant antiestrogen therapy ------------------------------------------------------------------------------------------------------------------------------------------- Current treatment: Cycle 1 day 8 TCBlanderjeta Echocardiogram 09/06/2020: EF 60 to 65%  Chemo toxicities:   Monitoring closely for toxicities. Return to clinic in 2 weeks for cycle 2

## 2020-09-20 NOTE — Patient Instructions (Signed)

## 2020-09-20 NOTE — Progress Notes (Signed)
[facility] CSW Progress Note  Hol[employer] representative met with patient to follow up on resources. Provided second installment of [employer]. Patient completed [employer] application, gave to [place] who completed letter and submitted. Patient is still working on [employer] and will return it to this [place] when ready.  Pt is experiencing more changes in taste (more vinegar taste with fruits and juice, water smelling, ensure tasting bad). Notified MD and message sent to RD.    Edwinna Areola Lemonte Al LCSW

## 2020-09-21 ENCOUNTER — Telehealth: Payer: Self-pay | Admitting: Hematology and Oncology

## 2020-09-21 NOTE — Telephone Encounter (Signed)
No 9/27 los, no changes made to pt schedule

## 2020-09-24 DIAGNOSIS — Z419 Encounter for procedure for purposes other than remedying health state, unspecified: Secondary | ICD-10-CM | POA: Diagnosis not present

## 2020-10-03 NOTE — Progress Notes (Signed)
Patient Care Team: Girtha Rm, NP-C as PCP - General (Family Medicine) Sueanne Margarita, MD as PCP - Cardiology (Cardiology) Rockwell Germany, RN as Oncology Nurse Navigator [name], RN as Oncology Nurse Navigator [name], MD as Consulting Physician (General Surgery) Nicholas Lose, MD as Consulting Physician (Hematology and Oncology) Gery Pray, MD as Consulting Physician (Radiation Oncology)  DIAGNOSIS:    ICD-10-CM   1. Malignant neoplasm of 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, estrogen receptor positive ([place])  C50.211    Z17.0     SUMMARY OF ONCOLOGIC HISTORY: Oncology History  Malignant neoplasm of 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, estrogen receptor positive ([place])  08/25/2020 Initial Diagnosis   Patient palpated a right breast lump x40yrand a left breast lump x278yrDiagnostic mammogram and USKoreahowed in the right breast, a 2.7cm mass 5cm from the nipple and 0.8cm mass 1cm from the nipple at the 12:30 position, with borderline cortical thickening in the right axilla, and in the left breast, a 4.0cm mass at the 11 o'clock position representing a hamartoma. 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   09/13/2020 -  Chemotherapy   The patient had dexamethasone (DECADRON) 4 MG tablet, 8 mg, Oral, Daily, 1 of 1 cycle, Start date: 09/01/2020, End date: -- palonosetron (ALOXI) injection 0.25 mg, 0.25 mg, Intravenous,  Once, 1 of 6 cycles Administration: 0.25 mg (09/13/2020) pegfilgrastim-jmdb (FULPHILA) injection 6 mg, 6 mg, Subcutaneous,  Once, 1 of 1 cycle pegfilgrastim-cbqv (UDENYCA) injection 6 mg, 6 mg, Subcutaneous, Once, 1 of 6 cycles Administration: 6 mg (09/15/2020) CARBOplatin (PARAPLATIN) 560 mg in sodium chloride 0.9 % 250 mL chemo infusion, 559.8 mg (100 % of original dose 559.8 mg),  Intravenous,  Once, 1 of 6 cycles Dose modification:   (original dose 559.8 mg, Cycle 1) Administration: 560 mg (09/13/2020) DOCEtaxel (TAXOTERE) 120 mg in sodium chloride 0.9 % 250 mL chemo infusion, 75 mg/m2 = 120 mg, Intravenous,  Once, 1 of 6 cycles Administration: 120 mg (09/13/2020) fosaprepitant (EMEND) 150 mg in sodium chloride 0.9 % 145 mL IVPB, 150 mg, Intravenous,  Once, 1 of 6 cycles Administration: 150 mg (09/13/2020) pertuzumab (PERJETA) 420 mg in sodium chloride 0.9 % 250 mL chemo infusion, 420 mg (100 % of original dose 420 mg), Intravenous, Once, 1 of 6 cycles Dose modification: 420 mg (original dose 420 mg, Cycle 1, Reason: Provider Judgment) Administration: 420 mg (09/13/2020) trastuzumab-anns (KANJINTI) 420 mg in sodium chloride 0.9 % 250 mL chemo infusion, 8 mg/kg = 420 mg, Intravenous,  Once, 1 of 6 cycles Administration: 420 mg (09/13/2020)  for chemotherapy treatment.      CHIEF COMPLIANT: Cycle 2 TCH Perjeta  INTERVAL HISTORY: TySHERIDYN CANINOs a [age].o. with above-mentioned history of right breastcancer currently on neoadjuvant chemotherapy with TCGalvestonShe presents to the clinic todayfora toxicity check and cycle 2.heartburn is better with Prilosec.  Diarrhea is under better control with Imodium.  She has noticed that the taste is not good for most of the foods that she normally eats but she is not able to find other foods that she can eat.  She had mild nausea which got better after Prilosec.  ALLERGIES:  is allergic to clarithromycin.  MEDICATIONS:  Current Outpatient Medications  Medication Sig Dispense Refill  . albuterol (PROVENTIL HFA;VENTOLIN HFA)  108 (90 Base) MCG/ACT inhaler Inhale 2 puffs into the lungs every 6 (six) hours as needed for wheezing or shortness of breath. 1 Inhaler 0  . Biotin 5000 MCG CAPS Take 1 capsule by mouth daily.     . Cholecalciferol (VITAMIN D3) 2000 units TABS Take 1 tablet by mouth daily.     . citalopram (CELEXA) 10 MG  tablet Take 1 tablet (10 mg total) by mouth daily. 90 tablet 3  . dexamethasone (DECADRON) 4 MG tablet Take 2 tablets (8 mg total) by mouth daily. Take 1 tablet day before chemo and 1 tablet day after chemo with food 12 tablet 0  . lidocaine-prilocaine (EMLA) cream Apply to affected area once 30 g 3  . LORazepam (ATIVAN) 0.5 MG tablet Take 1 tablet (0.5 mg total) by mouth at bedtime as needed (Nausea or vomiting). 30 tablet 0  . Multiple Vitamin (MULTIVITAMIN WITH MINERALS) TABS Take 1 tablet by mouth daily.    . ondansetron (ZOFRAN) 8 MG tablet Take 1 tablet (8 mg total) by mouth 2 (two) times daily as needed (Nausea or vomiting). Start on the third day after chemotherapy. 30 tablet 1  . oseltamivir (TAMIFLU) 75 MG capsule Take 1 capsule (75 mg total) by mouth 2 (two) times daily. 10 capsule 0  . Prenatal Vit-DSS-Fe Cbn-FA (PRENATAL ADVANTAGE PO) Take by mouth.    . prochlorperazine (COMPAZINE) 10 MG tablet Take 1 tablet (10 mg total) by mouth every 6 (six) hours as needed (Nausea or vomiting). 30 tablet 1  . TURMERIC PO Take 1 tablet by mouth daily.      No current facility-administered medications for this visit.    PHYSICAL EXAMINATION: ECOG PERFORMANCE STATUS: 1 - Symptomatic but completely ambulatory  Vitals:   10/04/20 0924  BP: 126/90  Pulse: 84  Resp: 18  Temp: (!) 97 F (36.1 C)  SpO2: 100%   Filed Weights   10/04/20 0924  Weight: 121 lb 9.6 oz (55.2 kg)    LABORATORY DATA:  I have reviewed the data as listed CMP Latest Ref Rng & Units 10/04/2020 09/20/2020 09/13/2020  Glucose 70 - 99 mg/dL 213(H) 105(H) 78  BUN 6 - 20 mg/dL 9 14 13   Creatinine 0.44 - 1.00 mg/dL 0.73 0.67 0.66  Sodium 135 - 145 mmol/L 140 140 140  Potassium 3.5 - 5.1 mmol/L 3.4(L) 3.8 4.0  Chloride 98 - 111 mmol/L 106 105 106  CO2 22 - 32 mmol/L 27 31 27   Calcium 8.9 - 10.3 mg/dL 9.7 9.8 9.4  Total Protein 6.5 - 8.1 g/dL 6.9 7.2 6.8  Total Bilirubin 0.3 - 1.2 mg/dL 0.2(L) 0.2(L) 0.5  Alkaline Phos  38 - 126 U/L 85 81 57  AST 15 - 41 U/L 67(H) 33 31  ALT 0 - 44 U/L 139(H) 67(H) 30    Lab Results  Component Value Date   WBC 7.1 10/04/2020   HGB 10.7 (L) 10/04/2020   HCT 32.8 (L) 10/04/2020   MCV 84.8 10/04/2020   PLT 258 10/04/2020   NEUTROABS 6.1 10/04/2020    ASSESSMENT & PLAN:  Malignant neoplasm of 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, estrogen receptor positive ([place]) Patient palpated a right breast lump x79yrand a left breast lump x227yrDiagnostic mammogram and USKoreahowed in the right breast, a 2.7cm mass 5cm from the nipple and 0.8cm mass 1cm from the nipple at the 12:30 position, with borderline cortical thickening in the right axilla, and in the left breast, a 4.0cm mass at  the 11 o'clock position representing a hamartoma. 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  Treatment plan: 1. Neoadjuvant chemotherapy with TCH Perjeta 6 cycles followed by Herceptin maintenance for 1 year 2. Followed by breast conserving surgery if possible with sentinel lymph node study 3. Followed by adjuvant radiation therapy if patient had lumpectomy 4.Followed by adjuvant antiestrogen therapy ------------------------------------------------------------------------------------------------------------------------------------------- Current treatment: Cycle 2 TCH Perjeta Echocardiogram 09/06/2020: EF 60 to 65%  Chemo toxicities: 1.  Diarrhea: She now knows how to manage diarrhea with Imodium. 2. nausea and vomiting: Improved after taking Prilosec for heartburn. 3.  Fatigue 4.  Severe scalp tenderness: She shaved her head and this is much better. 5.  Lack of taste and appetite: Unfortunately this is limiting her food choices.    She is seeing the dietitian today. 6.  Elevated LFTs: Okay to treat.  We will watch and monitor. 7.  Chemo induced  anemia: Monitoring 8.  Elevated blood sugars: I discontinued dexamethasone pills.  Monitoring closely for toxicities. Return to clinic in  3 weeks for cycle 3    No orders of the defined types were placed in this encounter.  The patient has a good understanding of the overall plan. she agrees with it. she will call with any problems that may develop before the next visit here.  Total time spent: 30 mins including face to face time and time spent for planning, charting and coordination of care  Nicholas Lose, MD 10/04/2020  I, Cloyde Reams Dorshimer, am acting as scribe for Dr. Nicholas Lose.  I have reviewed the above documentation for accuracy and completeness, and I agree with the above.

## 2020-10-04 ENCOUNTER — Inpatient Hospital Stay: Payer: Medicaid Other

## 2020-10-04 ENCOUNTER — Inpatient Hospital Stay: Payer: Medicaid Other | Attending: Hematology and Oncology

## 2020-10-04 ENCOUNTER — Encounter: Payer: Self-pay | Admitting: *Deleted

## 2020-10-04 ENCOUNTER — Inpatient Hospital Stay (HOSPITAL_BASED_OUTPATIENT_CLINIC_OR_DEPARTMENT_OTHER): Payer: Medicaid Other | Admitting: Hematology and Oncology

## 2020-10-04 ENCOUNTER — Other Ambulatory Visit: Payer: Self-pay

## 2020-10-04 ENCOUNTER — Inpatient Hospital Stay: Payer: Medicaid Other | Admitting: Nutrition

## 2020-10-04 DIAGNOSIS — C50211 Malignant neoplasm of upper-inner quadrant of right female breast: Secondary | ICD-10-CM | POA: Insufficient documentation

## 2020-10-04 DIAGNOSIS — Z170 Estrogen receptor positive status [ER+]: Secondary | ICD-10-CM

## 2020-10-04 DIAGNOSIS — D6481 Anemia due to antineoplastic chemotherapy: Secondary | ICD-10-CM | POA: Diagnosis not present

## 2020-10-04 DIAGNOSIS — Z5111 Encounter for antineoplastic chemotherapy: Secondary | ICD-10-CM | POA: Insufficient documentation

## 2020-10-04 DIAGNOSIS — Z5112 Encounter for antineoplastic immunotherapy: Secondary | ICD-10-CM | POA: Diagnosis not present

## 2020-10-04 DIAGNOSIS — Z23 Encounter for immunization: Secondary | ICD-10-CM | POA: Diagnosis not present

## 2020-10-04 DIAGNOSIS — Z0000 Encounter for general adult medical examination without abnormal findings: Secondary | ICD-10-CM

## 2020-10-04 LAB — CBC WITH DIFFERENTIAL (CANCER CENTER ONLY)
Abs Immature Granulocytes: 0.03 10*3/uL (ref 0.00–0.07)
Basophils Absolute: 0 10*3/uL (ref 0.0–0.1)
Basophils Relative: 0 %
Eosinophils Absolute: 0 10*3/uL (ref 0.0–0.5)
Eosinophils Relative: 0 %
HCT: 32.8 % — ABNORMAL LOW (ref 36.0–46.0)
Hemoglobin: 10.7 g/dL — ABNORMAL LOW (ref 12.0–15.0)
Immature Granulocytes: 0 %
Lymphocytes Relative: 11 %
Lymphs Abs: 0.8 10*3/uL (ref 0.7–4.0)
MCH: 27.6 pg (ref 26.0–34.0)
MCHC: 32.6 g/dL (ref 30.0–36.0)
MCV: 84.8 fL (ref 80.0–100.0)
Monocytes Absolute: 0.1 10*3/uL (ref 0.1–1.0)
Monocytes Relative: 2 %
Neutro Abs: 6.1 10*3/uL (ref 1.7–7.7)
Neutrophils Relative %: 87 %
Platelet Count: 258 10*3/uL (ref 150–400)
RBC: 3.87 MIL/uL (ref 3.87–5.11)
RDW: 15 % (ref 11.5–15.5)
WBC Count: 7.1 10*3/uL (ref 4.0–10.5)
nRBC: 0 % (ref 0.0–0.2)

## 2020-10-04 LAB — CMP (CANCER CENTER ONLY)
ALT: 139 U/L — ABNORMAL HIGH (ref 0–44)
AST: 67 U/L — ABNORMAL HIGH (ref 15–41)
Albumin: 3.7 g/dL (ref 3.5–5.0)
Alkaline Phosphatase: 85 U/L (ref 38–126)
Anion gap: 7 (ref 5–15)
BUN: 9 mg/dL (ref 6–20)
CO2: 27 mmol/L (ref 22–32)
Calcium: 9.7 mg/dL (ref 8.9–10.3)
Chloride: 106 mmol/L (ref 98–111)
Creatinine: 0.73 mg/dL (ref 0.44–1.00)
GFR, Estimated: >60 mL/min (ref >60)
Glucose, Bld: 213 mg/dL — ABNORMAL HIGH (ref 70–99)
Potassium: 3.4 mmol/L — ABNORMAL LOW (ref 3.5–5.1)
Sodium: 140 mmol/L (ref 135–145)
Total Bilirubin: 0.2 mg/dL — ABNORMAL LOW (ref 0.3–1.2)
Total Protein: 6.9 g/dL (ref 6.5–8.1)

## 2020-10-04 MED ORDER — TRASTUZUMAB-ANNS CHEMO 150 MG IV SOLR
300.0000 mg | Freq: Once | INTRAVENOUS | Status: AC
  Administered 2020-10-04: 300 mg via INTRAVENOUS
  Filled 2020-10-04: qty 14.29

## 2020-10-04 MED ORDER — LOPERAMIDE HCL 2 MG PO CAPS: 2.0000 mg | ORAL_CAPSULE | ORAL | 0 refills | Status: DC | PRN

## 2020-10-04 MED ORDER — HEPARIN SOD (PORK) LOCK FLUSH 100 UNIT/ML IV SOLN
500.0000 [IU] | Freq: Once | INTRAVENOUS | Status: AC | PRN
  Administered 2020-10-04: 500 [IU]
  Filled 2020-10-04: qty 5

## 2020-10-04 MED ORDER — SODIUM CHLORIDE 0.9 % IV SOLN
150.0000 mg | Freq: Once | INTRAVENOUS | Status: AC
  Administered 2020-10-04: 150 mg via INTRAVENOUS
  Filled 2020-10-04: qty 150

## 2020-10-04 MED ORDER — SODIUM CHLORIDE 0.9 % IV SOLN
10.0000 mg | Freq: Once | INTRAVENOUS | Status: AC
  Administered 2020-10-04: 10 mg via INTRAVENOUS
  Filled 2020-10-04: qty 10

## 2020-10-04 MED ORDER — DIPHENHYDRAMINE HCL 25 MG PO CAPS
50.0000 mg | ORAL_CAPSULE | Freq: Once | ORAL | Status: AC
  Administered 2020-10-04: 50 mg via ORAL

## 2020-10-04 MED ORDER — INFLUENZA VAC SPLIT QUAD 0.5 ML IM SUSY
PREFILLED_SYRINGE | INTRAMUSCULAR | Status: AC
  Filled 2020-10-04: qty 0.5

## 2020-10-04 MED ORDER — LORATADINE 10 MG PO TABS: 10.0000 mg | ORAL_TABLET | Freq: Every day | ORAL | Status: DC

## 2020-10-04 MED ORDER — ACETAMINOPHEN 325 MG PO TABS
ORAL_TABLET | ORAL | Status: AC
  Filled 2020-10-04: qty 2

## 2020-10-04 MED ORDER — SODIUM CHLORIDE 0.9 % IV SOLN
Freq: Once | INTRAVENOUS | Status: AC
  Filled 2020-10-04: qty 250

## 2020-10-04 MED ORDER — SODIUM CHLORIDE 0.9% FLUSH
10.0000 mL | INTRAVENOUS | Status: DC | PRN
  Administered 2020-10-04: 10 mL
  Filled 2020-10-04: qty 10

## 2020-10-04 MED ORDER — SODIUM CHLORIDE 0.9 % IV SOLN
560.0000 mg | Freq: Once | INTRAVENOUS | Status: AC
  Administered 2020-10-04: 560 mg via INTRAVENOUS
  Filled 2020-10-04: qty 56

## 2020-10-04 MED ORDER — ACETAMINOPHEN 325 MG PO TABS
650.0000 mg | ORAL_TABLET | Freq: Once | ORAL | Status: AC
  Administered 2020-10-04: 650 mg via ORAL

## 2020-10-04 MED ORDER — INFLUENZA VAC SPLIT QUAD 0.5 ML IM SUSY
0.5000 mL | PREFILLED_SYRINGE | Freq: Once | INTRAMUSCULAR | Status: AC
  Administered 2020-10-04: 0.5 mL via INTRAMUSCULAR

## 2020-10-04 MED ORDER — PALONOSETRON HCL INJECTION 0.25 MG/5ML
INTRAVENOUS | Status: AC
  Filled 2020-10-04: qty 5

## 2020-10-04 MED ORDER — SODIUM CHLORIDE 0.9 % IV SOLN
420.0000 mg | Freq: Once | INTRAVENOUS | Status: AC
  Administered 2020-10-04: 420 mg via INTRAVENOUS
  Filled 2020-10-04: qty 14

## 2020-10-04 MED ORDER — DIPHENHYDRAMINE HCL 25 MG PO CAPS
ORAL_CAPSULE | ORAL | Status: AC
  Filled 2020-10-04: qty 2

## 2020-10-04 MED ORDER — OMEPRAZOLE 20 MG PO CPDR: 20.0000 mg | DELAYED_RELEASE_CAPSULE | Freq: Every day | ORAL | Status: DC

## 2020-10-04 MED ORDER — SODIUM CHLORIDE 0.9 % IV SOLN
75.0000 mg/m2 | Freq: Once | INTRAVENOUS | Status: AC
  Administered 2020-10-04: 120 mg via INTRAVENOUS
  Filled 2020-10-04: qty 12

## 2020-10-04 MED ORDER — SODIUM CHLORIDE 0.9% FLUSH
10.0000 mL | Freq: Once | INTRAVENOUS | Status: AC
  Administered 2020-10-04: 10 mL
  Filled 2020-10-04: qty 10

## 2020-10-04 MED ORDER — PALONOSETRON HCL INJECTION 0.25 MG/5ML
0.2500 mg | Freq: Once | INTRAVENOUS | Status: AC
  Administered 2020-10-04: 0.25 mg via INTRAVENOUS

## 2020-10-04 NOTE — Patient Instructions (Signed)
[facility] Discharge Instructions for Patients Receiving Chemotherapy  Today you received the following chemotherapy agents trastuzumab, pertuzumab, docetaxel, carboplatin.  To help prevent nausea and vomiting after your treatment, we encourage you to take your nausea medication as directed.    If you develop nausea and vomiting that is not controlled by your nausea medication, call the clinic.   BELOW ARE SYMPTOMS THAT SHOULD BE REPORTED IMMEDIATELY:  *FEVER GREATER THAN 100.5 F  *CHILLS WITH OR WITHOUT FEVER  NAUSEA AND VOMITING THAT IS NOT CONTROLLED WITH YOUR NAUSEA MEDICATION  *UNUSUAL SHORTNESS OF BREATH  *UNUSUAL BRUISING OR BLEEDING  TENDERNESS IN MOUTH AND THROAT WITH OR WITHOUT PRESENCE OF ULCERS  *URINARY PROBLEMS  *BOWEL PROBLEMS  UNUSUAL RASH Items with * indicate a potential emergency and should be followed up as soon as possible.  Feel free to call the clinic should you have any questions or concerns. The clinic phone number is [phone].  Please show the CHEMO ALERT CARD at check-in to the Emergency Department and triage nurse.   

## 2020-10-04 NOTE — Assessment & Plan Note (Signed)
Patient palpated a right breast lump x56yrand a left breast lump x245yrDiagnostic mammogram and USKoreahowed in the right breast, a 2.7cm mass 5cm from the nipple and 0.8cm mass 1cm from the nipple at the 12:30 position, with borderline cortical thickening in the right axilla, and in the left breast, a 4.0cm mass at the 11 o'clock position representing a hamartoma. 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  Treatment plan: 1. Neoadjuvant chemotherapy with TCH Perjeta 6 cycles followed by Herceptin maintenance for 1 year 2. Followed by breast conserving surgery if possible with sentinel lymph node study 3. Followed by adjuvant radiation therapy if patient had lumpectomy 4.Followed by adjuvant antiestrogen therapy ------------------------------------------------------------------------------------------------------------------------------------------- Current treatment: Cycle 2 TCH Perjeta Echocardiogram 09/06/2020: EF 60 to 65%  Chemo toxicities: 1.  Diarrhea: 2 to 3 days responded to Imodium 2. nausea and vomiting: 2 to 3 days started on day 4, responded to Compazine and Zofran 3.  Fatigue 4.  Severe scalp tenderness: She shaved her head and this is much better. 5.  Lack of taste and appetite: Unfortunately this is limiting her food choices.  She wants to see the dietitian next time.   Monitoring closely for toxicities. Return to clinic in  3 weeks for cycle 3

## 2020-10-04 NOTE — Progress Notes (Signed)
54 year old female diagnosed with ER positive breast cancer.  She is a patient of Dr. Sonny Dandy. She is receiving Perjeta and carbotaxol.  Past medical history includes depression and anemia.  Medications include biotin, vitamin D3, Celexa, Decadron, Ativan, multivitamin, Zofran, Compazine, and turmeric.  Labs were reviewed.  Height: 5 feet 5 inches. Weight: 121.6 pounds October 11. Usual body weight: 119.8 pounds in January 2020. BMI: 20.24.  Patient reports she has been experimenting with different foods and spices to improve taste alterations. She keeps a food journal so she knows what she likes and what she does not like. Reports diarrhea after drinking 1 bottle of Ensure.  She tried to drink half of a bottle.  She drank some Gatorade with it.  She denied problems with diarrhea when drinking just a half a bottle. Reports nausea and vomiting have been better with medications. Stool consistency varies.  Patient takes Imodium with improvement.  Nutrition diagnosis:  Food and nutrition related knowledge deficit related to breast cancer and associated treatments as evidenced by no prior need for nutrition related information.  Intervention: Patient educated to consume more more frequent meals and snacks.  Recommended continue half bottle Ensure Plus or equivalent twice daily between meals. Continue strategies for improving taste alterations.  Fact sheet was provided. Educated on strategies for eating with nausea and vomiting. Provided coupons for oral nutrition supplements. Questions were answered.  Teach back method used.  Contact information was given.  Monitoring, evaluation, goals: Patient will work to increase calories and protein to minimize weight loss throughout treatment.  Next visit: Monday, November 22 during infusion.  **Disclaimer: This note was dictated with voice recognition software. Similar sounding words can inadvertently be transcribed and this note may contain  transcription errors which may not have been corrected upon publication of note.**

## 2020-10-05 NOTE — Progress Notes (Signed)
The following Assist/Replace Program for Perjeta from [facility] has been terminated due to [facility] Coverage.  Last DOS: 09/13/2020.

## 2020-10-05 NOTE — Progress Notes (Signed)
The following Assist/Replace Program for Udenyca from [facility] has been terminated due to [facility] Coverage.  Last DOS: 09/16/2020.

## 2020-10-05 NOTE — Progress Notes (Signed)
The following Assist/Replace Program for Kajinti from [place] has been terminated due to [facility] Coverage.  Last DOS: 09/13/2020.

## 2020-10-06 ENCOUNTER — Inpatient Hospital Stay: Payer: Medicaid Other

## 2020-10-06 ENCOUNTER — Telehealth: Payer: Self-pay | Admitting: Hematology and Oncology

## 2020-10-06 ENCOUNTER — Other Ambulatory Visit: Payer: Self-pay

## 2020-10-06 VITALS — BP 127/81 | HR 68 | Temp 98.3°F | Resp 18

## 2020-10-06 DIAGNOSIS — Z170 Estrogen receptor positive status [ER+]: Secondary | ICD-10-CM | POA: Diagnosis not present

## 2020-10-06 DIAGNOSIS — C50211 Malignant neoplasm of upper-inner quadrant of right female breast: Secondary | ICD-10-CM

## 2020-10-06 DIAGNOSIS — Z5112 Encounter for antineoplastic immunotherapy: Secondary | ICD-10-CM | POA: Diagnosis not present

## 2020-10-06 DIAGNOSIS — D6481 Anemia due to antineoplastic chemotherapy: Secondary | ICD-10-CM | POA: Diagnosis not present

## 2020-10-06 DIAGNOSIS — Z23 Encounter for immunization: Secondary | ICD-10-CM | POA: Diagnosis not present

## 2020-10-06 DIAGNOSIS — Z5111 Encounter for antineoplastic chemotherapy: Secondary | ICD-10-CM | POA: Diagnosis not present

## 2020-10-06 MED ORDER — PEGFILGRASTIM-CBQV 6 MG/0.6ML ~~LOC~~ SOSY
6.0000 mg | PREFILLED_SYRINGE | Freq: Once | SUBCUTANEOUS | Status: AC
  Administered 2020-10-06: 6 mg via SUBCUTANEOUS

## 2020-10-06 NOTE — Telephone Encounter (Signed)
No 10/11 los, no changes made to pt schedule

## 2020-10-06 NOTE — Patient Instructions (Signed)
Pegfilgrastim (Udenyca) injection What is this medicine? PEGFILGRASTIM (PEG fil gra stim) is a long-acting granulocyte colony-stimulating factor that stimulates the growth of neutrophils, a type of white blood cell important in the body's fight against infection. It is used to reduce the incidence of fever and infection in patients with certain types of cancer who are receiving chemotherapy that affects the bone marrow, and to increase survival after being exposed to high doses of radiation. This medicine may be used for other purposes; ask your health care provider or pharmacist if you have questions. COMMON BRAND NAME(S): Fulphila, Neulasta, UDENYCA, Ziextenzo What should I tell my health care provider before I take this medicine? They need to know if you have any of these conditions:  kidney disease  latex allergy  ongoing radiation therapy  sickle cell disease  skin reactions to acrylic adhesives (On-Body Injector only)  an unusual or allergic reaction to pegfilgrastim, filgrastim, other medicines, foods, dyes, or preservatives  pregnant or trying to get pregnant  breast-feeding How should I use this medicine? This medicine is for injection under the skin. If you get this medicine at home, you will be taught how to prepare and give the pre-filled syringe or how to use the On-body Injector. Refer to the patient Instructions for Use for detailed instructions. Use exactly as directed. Tell your healthcare provider immediately if you suspect that the On-body Injector may not have performed as intended or if you suspect the use of the On-body Injector resulted in a missed or partial dose. It is important that you put your used needles and syringes in a special sharps container. Do not put them in a trash can. If you do not have a sharps container, call your pharmacist or healthcare provider to get one. Talk to your pediatrician regarding the use of this medicine in children. While this drug  may be prescribed for selected conditions, precautions do apply. Overdosage: If you think you have taken too much of this medicine contact a poison control center or emergency room at once. NOTE: This medicine is only for you. Do not share this medicine with others. What if I miss a dose? It is important not to miss your dose. Call your doctor or health care professional if you miss your dose. If you miss a dose due to an On-body Injector failure or leakage, a new dose should be administered as soon as possible using a single prefilled syringe for manual use. What may interact with this medicine? Interactions have not been studied. Give your health care provider a list of all the medicines, herbs, non-prescription drugs, or dietary supplements you use. Also tell them if you smoke, drink alcohol, or use illegal drugs. Some items may interact with your medicine. This list may not describe all possible interactions. Give your health care provider a list of all the medicines, herbs, non-prescription drugs, or dietary supplements you use. Also tell them if you smoke, drink alcohol, or use illegal drugs. Some items may interact with your medicine. What should I watch for while using this medicine? You may need blood work done while you are taking this medicine. If you are going to need a MRI, CT scan, or other procedure, tell your doctor that you are using this medicine (On-Body Injector only). What side effects may I notice from receiving this medicine? Side effects that you should report to your doctor or health care professional as soon as possible:  allergic reactions like skin rash, itching or hives, swelling of   the face, lips, or tongue  back pain  dizziness  fever  pain, redness, or irritation at site where injected  pinpoint red spots on the skin  red or dark-brown urine  shortness of breath or breathing problems  stomach or side pain, or pain at the  shoulder  swelling  tiredness  trouble passing urine or change in the amount of urine Side effects that usually do not require medical attention (report to your doctor or health care professional if they continue or are bothersome):  bone pain  muscle pain This list may not describe all possible side effects. Call your doctor for medical advice about side effects. You may report side effects to FDA at 1-800-FDA-1088. Where should I keep my medicine? Keep out of the reach of children. If you are using this medicine at home, you will be instructed on how to store it. Throw away any unused medicine after the expiration date on the label. NOTE: This sheet is a summary. It may not cover all possible information. If you have questions about this medicine, talk to your doctor, pharmacist, or health care provider.  2020 Elsevier/Gold Standard (2018-03-18 16:57:08)  

## 2020-10-13 ENCOUNTER — Encounter: Payer: Self-pay | Admitting: Licensed Clinical Social Worker

## 2020-10-13 NOTE — Progress Notes (Signed)
[place] CSW Progress Note  Clinical Education officer, museum received call from patient. She has received assistance from [place] and a deferment on mortgage until January. Still waiting to follow-up with other mortgage and utility assistance. Working on [employer]- will contact [place] when ready to submit. Application for disability has been submitted to [employer].    Edwinna Areola Cherrill Scrima , LCSW

## 2020-10-19 ENCOUNTER — Telehealth: Payer: Self-pay | Admitting: Genetic Counselor

## 2020-10-19 NOTE — Telephone Encounter (Signed)
Reached out to Amanda Rich to discuss options for genetic testing now that her Medicaid coverage has been approved. She requested that we talk more on Monday (11/1) during her chemo infusion, as she is resting today. I will be happy to give her a call to talk more next Monday.

## 2020-10-25 ENCOUNTER — Telehealth: Payer: Self-pay | Admitting: Genetic Counselor

## 2020-10-25 ENCOUNTER — Inpatient Hospital Stay: Payer: Medicaid Other | Attending: Hematology and Oncology

## 2020-10-25 ENCOUNTER — Encounter: Payer: Self-pay | Admitting: Licensed Clinical Social Worker

## 2020-10-25 ENCOUNTER — Inpatient Hospital Stay (HOSPITAL_BASED_OUTPATIENT_CLINIC_OR_DEPARTMENT_OTHER): Payer: Medicaid Other | Admitting: Hematology and Oncology

## 2020-10-25 ENCOUNTER — Other Ambulatory Visit: Payer: Self-pay

## 2020-10-25 ENCOUNTER — Inpatient Hospital Stay: Payer: Medicaid Other

## 2020-10-25 ENCOUNTER — Encounter: Payer: Self-pay | Admitting: *Deleted

## 2020-10-25 DIAGNOSIS — C50211 Malignant neoplasm of upper-inner quadrant of right female breast: Secondary | ICD-10-CM

## 2020-10-25 DIAGNOSIS — D6481 Anemia due to antineoplastic chemotherapy: Secondary | ICD-10-CM | POA: Diagnosis not present

## 2020-10-25 DIAGNOSIS — Z5111 Encounter for antineoplastic chemotherapy: Secondary | ICD-10-CM | POA: Insufficient documentation

## 2020-10-25 DIAGNOSIS — Z5112 Encounter for antineoplastic immunotherapy: Secondary | ICD-10-CM | POA: Insufficient documentation

## 2020-10-25 DIAGNOSIS — Z170 Estrogen receptor positive status [ER+]: Secondary | ICD-10-CM | POA: Diagnosis not present

## 2020-10-25 DIAGNOSIS — Z419 Encounter for procedure for purposes other than remedying health state, unspecified: Secondary | ICD-10-CM | POA: Diagnosis not present

## 2020-10-25 LAB — CMP (CANCER CENTER ONLY)
ALT: 53 U/L — ABNORMAL HIGH (ref 0–44)
AST: 42 U/L — ABNORMAL HIGH (ref 15–41)
Albumin: 3.5 g/dL (ref 3.5–5.0)
Alkaline Phosphatase: 80 U/L (ref 38–126)
Anion gap: 8 (ref 5–15)
BUN: 10 mg/dL (ref 6–20)
CO2: 25 mmol/L (ref 22–32)
Calcium: 9 mg/dL (ref 8.9–10.3)
Chloride: 108 mmol/L (ref 98–111)
Creatinine: 0.64 mg/dL (ref 0.44–1.00)
GFR, Estimated: >60 mL/min (ref >60)
Glucose, Bld: 93 mg/dL (ref 70–99)
Potassium: 3.4 mmol/L — ABNORMAL LOW (ref 3.5–5.1)
Sodium: 141 mmol/L (ref 135–145)
Total Bilirubin: 0.3 mg/dL (ref 0.3–1.2)
Total Protein: 6.2 g/dL — ABNORMAL LOW (ref 6.5–8.1)

## 2020-10-25 LAB — CBC WITH DIFFERENTIAL (CANCER CENTER ONLY)
Abs Immature Granulocytes: 0.01 10*3/uL (ref 0.00–0.07)
Basophils Absolute: 0 10*3/uL (ref 0.0–0.1)
Basophils Relative: 1 %
Eosinophils Absolute: 0 10*3/uL (ref 0.0–0.5)
Eosinophils Relative: 1 %
HCT: 29.7 % — ABNORMAL LOW (ref 36.0–46.0)
Hemoglobin: 9.9 g/dL — ABNORMAL LOW (ref 12.0–15.0)
Immature Granulocytes: 0 %
Lymphocytes Relative: 42 %
Lymphs Abs: 2 10*3/uL (ref 0.7–4.0)
MCH: 28.8 pg (ref 26.0–34.0)
MCHC: 33.3 g/dL (ref 30.0–36.0)
MCV: 86.3 fL (ref 80.0–100.0)
Monocytes Absolute: 0.8 10*3/uL (ref 0.1–1.0)
Monocytes Relative: 17 %
Neutro Abs: 1.8 10*3/uL (ref 1.7–7.7)
Neutrophils Relative %: 39 %
Platelet Count: 211 10*3/uL (ref 150–400)
RBC: 3.44 MIL/uL — ABNORMAL LOW (ref 3.87–5.11)
RDW: 15.8 % — ABNORMAL HIGH (ref 11.5–15.5)
WBC Count: 4.6 10*3/uL (ref 4.0–10.5)
nRBC: 0 % (ref 0.0–0.2)

## 2020-10-25 MED ORDER — PALONOSETRON HCL INJECTION 0.25 MG/5ML
0.2500 mg | Freq: Once | INTRAVENOUS | Status: AC
  Administered 2020-10-25: 0.25 mg via INTRAVENOUS

## 2020-10-25 MED ORDER — SODIUM CHLORIDE 0.9% FLUSH
10.0000 mL | INTRAVENOUS | Status: DC | PRN
  Administered 2020-10-25: 10 mL
  Filled 2020-10-25: qty 10

## 2020-10-25 MED ORDER — SODIUM CHLORIDE 0.9% FLUSH
10.0000 mL | Freq: Once | INTRAVENOUS | Status: AC
  Administered 2020-10-25: 10 mL
  Filled 2020-10-25: qty 10

## 2020-10-25 MED ORDER — TRASTUZUMAB-ANNS CHEMO 150 MG IV SOLR
300.0000 mg | Freq: Once | INTRAVENOUS | Status: AC
  Administered 2020-10-25: 300 mg via INTRAVENOUS
  Filled 2020-10-25: qty 14.29

## 2020-10-25 MED ORDER — SODIUM CHLORIDE 0.9 % IV SOLN
Freq: Once | INTRAVENOUS | Status: AC
  Filled 2020-10-25: qty 250

## 2020-10-25 MED ORDER — SODIUM CHLORIDE 0.9 % IV SOLN
75.0000 mg/m2 | Freq: Once | INTRAVENOUS | Status: AC
  Administered 2020-10-25: 120 mg via INTRAVENOUS
  Filled 2020-10-25: qty 12

## 2020-10-25 MED ORDER — SODIUM CHLORIDE 0.9 % IV SOLN
560.0000 mg | Freq: Once | INTRAVENOUS | Status: AC
  Administered 2020-10-25: 560 mg via INTRAVENOUS
  Filled 2020-10-25: qty 56

## 2020-10-25 MED ORDER — HEPARIN SOD (PORK) LOCK FLUSH 100 UNIT/ML IV SOLN
500.0000 [IU] | Freq: Once | INTRAVENOUS | Status: AC | PRN
  Administered 2020-10-25: 500 [IU]
  Filled 2020-10-25: qty 5

## 2020-10-25 MED ORDER — DIPHENHYDRAMINE HCL 25 MG PO CAPS
ORAL_CAPSULE | ORAL | Status: AC
  Filled 2020-10-25: qty 2

## 2020-10-25 MED ORDER — PALONOSETRON HCL INJECTION 0.25 MG/5ML
INTRAVENOUS | Status: AC
  Filled 2020-10-25: qty 5

## 2020-10-25 MED ORDER — DIPHENHYDRAMINE HCL 25 MG PO CAPS
50.0000 mg | ORAL_CAPSULE | Freq: Once | ORAL | Status: AC
  Administered 2020-10-25: 50 mg via ORAL

## 2020-10-25 MED ORDER — SODIUM CHLORIDE 0.9 % IV SOLN
420.0000 mg | Freq: Once | INTRAVENOUS | Status: AC
  Administered 2020-10-25: 420 mg via INTRAVENOUS
  Filled 2020-10-25: qty 14

## 2020-10-25 MED ORDER — ACETAMINOPHEN 325 MG PO TABS
650.0000 mg | ORAL_TABLET | Freq: Once | ORAL | Status: AC
  Administered 2020-10-25: 650 mg via ORAL

## 2020-10-25 MED ORDER — ACETAMINOPHEN 325 MG PO TABS
ORAL_TABLET | ORAL | Status: AC
  Filled 2020-10-25: qty 2

## 2020-10-25 MED ORDER — SODIUM CHLORIDE 0.9 % IV SOLN
150.0000 mg | Freq: Once | INTRAVENOUS | Status: AC
  Administered 2020-10-25: 150 mg via INTRAVENOUS
  Filled 2020-10-25: qty 150

## 2020-10-25 MED ORDER — SODIUM CHLORIDE 0.9 % IV SOLN
10.0000 mg | Freq: Once | INTRAVENOUS | Status: AC
  Administered 2020-10-25: 10 mg via INTRAVENOUS
  Filled 2020-10-25: qty 10

## 2020-10-25 NOTE — Patient Instructions (Signed)
[facility] Discharge Instructions for Patients Receiving Chemotherapy  Today you received the following chemotherapy agents trastuzumab, pertuzumab, docetaxel, carboplatin.  To help prevent nausea and vomiting after your treatment, we encourage you to take your nausea medication as directed.    If you develop nausea and vomiting that is not controlled by your nausea medication, call the clinic.   BELOW ARE SYMPTOMS THAT SHOULD BE REPORTED IMMEDIATELY:  *FEVER GREATER THAN 100.5 F  *CHILLS WITH OR WITHOUT FEVER  NAUSEA AND VOMITING THAT IS NOT CONTROLLED WITH YOUR NAUSEA MEDICATION  *UNUSUAL SHORTNESS OF BREATH  *UNUSUAL BRUISING OR BLEEDING  TENDERNESS IN MOUTH AND THROAT WITH OR WITHOUT PRESENCE OF ULCERS  *URINARY PROBLEMS  *BOWEL PROBLEMS  UNUSUAL RASH Items with * indicate a potential emergency and should be followed up as soon as possible.  Feel free to call the clinic should you have any questions or concerns. The clinic phone number is [phone].  Please show the [facility] at check-in to the Emergency Department and triage nurse.

## 2020-10-25 NOTE — Progress Notes (Signed)
Patient Care Team: Girtha Rm, NP-C as PCP - General (Family Medicine) Sueanne Margarita, MD as PCP - Cardiology (Cardiology) Rockwell Germany, RN as Oncology Nurse Navigator [name], RN as Oncology Nurse Navigator [name], MD as Consulting Physician (General Surgery) Nicholas Lose, MD as Consulting Physician (Hematology and Oncology) Gery Pray, MD as Consulting Physician (Radiation Oncology)  DIAGNOSIS:  Encounter Diagnosis  Name Primary?  . Malignant neoplasm of 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, estrogen receptor positive ([place])     SUMMARY OF ONCOLOGIC HISTORY: Oncology History  Malignant neoplasm of 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, estrogen receptor positive ([place])  08/25/2020 Initial Diagnosis   Patient palpated a right breast lump x54yrand a left breast lump x246yrDiagnostic mammogram and USKoreahowed in the right breast, a 2.7cm mass 5cm from the nipple and 0.8cm mass 1cm from the nipple at the 12:30 position, with borderline cortical thickening in the right axilla, and in the left breast, a 4.0cm mass at the 11 o'clock position representing a hamartoma. 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   09/13/2020 -  Chemotherapy   The patient had dexamethasone (DECADRON) 4 MG tablet, 8 mg, Oral, Daily, 1 of 1 cycle, Start date: 09/01/2020, End date: 10/04/2020 palonosetron (ALOXI) injection 0.25 mg, 0.25 mg, Intravenous,  Once, 3 of 6 cycles Administration: 0.25 mg (09/13/2020), 0.25 mg (10/04/2020) pegfilgrastim-jmdb (FULPHILA) injection 6 mg, 6 mg, Subcutaneous,  Once, 1 of 1 cycle pegfilgrastim-cbqv (UDENYCA) injection 6 mg, 6 mg, Subcutaneous, Once, 3 of 6 cycles Administration: 6 mg (09/15/2020), 6 mg (10/06/2020) CARBOplatin (PARAPLATIN) 560 mg in sodium chloride 0.9 % 250 mL chemo infusion,  559.8 mg (100 % of original dose 559.8 mg), Intravenous,  Once, 3 of 6 cycles Dose modification:   (original dose 559.8 mg, Cycle 1) Administration: 560 mg (09/13/2020), 560 mg (10/04/2020) DOCEtaxel (TAXOTERE) 120 mg in sodium chloride 0.9 % 250 mL chemo infusion, 75 mg/m2 = 120 mg, Intravenous,  Once, 3 of 6 cycles Administration: 120 mg (09/13/2020), 120 mg (10/04/2020) fosaprepitant (EMEND) 150 mg in sodium chloride 0.9 % 145 mL IVPB, 150 mg, Intravenous,  Once, 3 of 6 cycles Administration: 150 mg (09/13/2020), 150 mg (10/04/2020) pertuzumab (PERJETA) 420 mg in sodium chloride 0.9 % 250 mL chemo infusion, 420 mg (100 % of original dose 420 mg), Intravenous, Once, 3 of 6 cycles Dose modification: 420 mg (original dose 420 mg, Cycle 1, Reason: Provider Judgment) Administration: 420 mg (09/13/2020), 420 mg (10/04/2020) trastuzumab-anns (KANJINTI) 420 mg in sodium chloride 0.9 % 250 mL chemo infusion, 8 mg/kg = 420 mg, Intravenous,  Once, 3 of 6 cycles Administration: 420 mg (09/13/2020), 300 mg (10/04/2020)  for chemotherapy treatment.      CHIEF COMPLIANT: Cycle 3 TCH Perjeta  INTERVAL HISTORY: TyDJENEBA BARSCHs a [age]ear old with above-mentioned history of right breast cancer was currently on neoadjuvant chemotherapy and today cycle 3 of TCMillers Creekerjeta.  She is tolerating chemo moderately well.  She has profound fatigue body aches and feeling ill well that last for 7 to 10 days.  She has intermittent diarrhea which is well controlled with Imodium.  She does not drink much liquids.  She has been eating a lot of sugar unfortunately.  Denies any nausea or vomiting.   ALLERGIES:  is allergic to clarithromycin.  MEDICATIONS:  Current Outpatient Medications  Medication Sig Dispense Refill  . albuterol (PROVENTIL HFA;VENTOLIN HFA) 108 (90 Base) MCG/ACT inhaler Inhale 2 puffs into the lungs every 6 (six) hours as needed for wheezing or shortness of breath. 1 Inhaler 0  . Biotin 5000 MCG CAPS Take 1  capsule by mouth daily.     . Cholecalciferol (VITAMIN D3) 2000 units TABS Take 1 tablet by mouth daily.     . citalopram (CELEXA) 10 MG tablet Take 1 tablet (10 mg total) by mouth daily. 90 tablet 3  . lidocaine-prilocaine (EMLA) cream Apply to affected area once 30 g 3  . loperamide (IMODIUM) 2 MG capsule Take 1 capsule (2 mg total) by mouth as needed for diarrhea or loose stools. 30 capsule 0  . loratadine (CLARITIN) 10 MG tablet Take 1 tablet (10 mg total) by mouth daily.    Marland Kitchen LORazepam (ATIVAN) 0.5 MG tablet Take 1 tablet (0.5 mg total) by mouth at bedtime as needed (Nausea or vomiting). 30 tablet 0  . Multiple Vitamin (MULTIVITAMIN WITH MINERALS) TABS Take 1 tablet by mouth daily.    Marland Kitchen omeprazole (PRILOSEC) 20 MG capsule Take 1 capsule (20 mg total) by mouth daily.    . ondansetron (ZOFRAN) 8 MG tablet Take 1 tablet (8 mg total) by mouth 2 (two) times daily as needed (Nausea or vomiting). Start on the third day after chemotherapy. 30 tablet 1  . Prenatal Vit-DSS-Fe Cbn-FA (PRENATAL ADVANTAGE PO) Take by mouth.    . prochlorperazine (COMPAZINE) 10 MG tablet Take 1 tablet (10 mg total) by mouth every 6 (six) hours as needed (Nausea or vomiting). 30 tablet 1  . TURMERIC PO Take 1 tablet by mouth daily.      No current facility-administered medications for this visit.   Facility-Administered Medications Ordered in Other Visits  Medication Dose Route Frequency Provider Last Rate Last Admin  . CARBOplatin (PARAPLATIN) 560 mg in sodium chloride 0.9 % 250 mL chemo infusion  560 mg Intravenous Once Nicholas Lose, MD      . DOCEtaxel (TAXOTERE) 120 mg in sodium chloride 0.9 % 250 mL chemo infusion  75 mg/m2 (Treatment Plan Recorded) Intravenous Once Nicholas Lose, MD      . fosaprepitant (EMEND) 150 mg in sodium chloride 0.9 % 145 mL IVPB  150 mg Intravenous Once Nicholas Lose, MD 450 mL/hr at 10/25/20 0944 150 mg at 10/25/20 0944  . heparin lock flush 100 unit/mL  500 Units Intracatheter Once PRN  Nicholas Lose, MD      . pertuzumab (PERJETA) 420 mg in sodium chloride 0.9 % 250 mL chemo infusion  420 mg Intravenous Once Nicholas Lose, MD      . sodium chloride flush (NS) 0.9 % injection 10 mL  10 mL Intracatheter PRN Nicholas Lose, MD      . Theotis Burrow [facility]) 300 mg in sodium chloride 0.9 % 250 mL chemo infusion  300 mg Intravenous Once Nicholas Lose, MD        PHYSICAL EXAMINATION: ECOG PERFORMANCE STATUS: 1 - Symptomatic but completely ambulatory  Vitals:   10/25/20 0822  BP: 105/71  Pulse: 73  Resp: 18  Temp: 98 F (36.7 C)  SpO2: 99%   Filed Weights   10/25/20 0822  Weight: 122 lb 14.4 oz (55.7 kg)      LABORATORY DATA:  I have reviewed the data as listed CMP Latest Ref Rng & Units 10/25/2020 10/04/2020 09/20/2020  Glucose 70 - 99 mg/dL 93 213(H) 105(H)  BUN 6 -  20 mg/dL 10 9 14   Creatinine 0.44 - 1.00 mg/dL 0.64 0.73 0.67  Sodium 135 - 145 mmol/L 141 140 140  Potassium 3.5 - 5.1 mmol/L 3.4(L) 3.4(L) 3.8  Chloride 98 - 111 mmol/L 108 106 105  CO2 22 - 32 mmol/L 25 27 31   Calcium 8.9 - 10.3 mg/dL 9.0 9.7 9.8  Total Protein 6.5 - 8.1 g/dL 6.2(L) 6.9 7.2  Total Bilirubin 0.3 - 1.2 mg/dL 0.3 0.2(L) 0.2(L)  Alkaline Phos 38 - 126 U/L 80 85 81  AST 15 - 41 U/L 42(H) 67(H) 33  ALT 0 - 44 U/L 53(H) 139(H) 67(H)    Lab Results  Component Value Date   WBC 4.6 10/25/2020   HGB 9.9 (L) 10/25/2020   HCT 29.7 (L) 10/25/2020   MCV 86.3 10/25/2020   PLT 211 10/25/2020   NEUTROABS 1.8 10/25/2020    ASSESSMENT & PLAN:  Malignant neoplasm of 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, estrogen receptor positive ([place]) Patient palpated a right breast lump x90yrand a left breast lump x285yrDiagnostic mammogram and USKoreahowed in the right breast, a 2.7cm mass 5cm from the nipple and 0.8cm mass 1cm from the nipple at the 12:30 position, with borderline cortical thickening in the right axilla, and in the left breast, a 4.0cm mass at the 11 o'clock position  representing a hamartoma. 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  Treatment plan: 1. Neoadjuvant chemotherapy with TCH Perjeta 6 cycles followed by Herceptin maintenance for 1 year 2. Followed by breast conserving surgery if possible with sentinel lymph node study 3. Followed by adjuvant radiation therapy if patient had lumpectomy 4.Followed by adjuvant antiestrogen therapy ------------------------------------------------------------------------------------------------------------------------------------------- Current treatment: Cycle 3TCH Perjeta Echocardiogram 09/06/2020: EF 60 to 65%  Chemo toxicities: 1.Diarrhea: She now knows how to manage diarrhea with Imodium. 2.nausea and vomiting: Improved after taking Prilosec for heartburn. 3.Fatigue: Continues to be problem 4.Lack of taste and appetite: She is very sad that for Thanksgiving she may not have good taste. 5.  Elevated LFTs:   Slightly better than before.  Okay to treat. 6.  Chemo induced anemia: Monitoring 7.  Elevated blood sugars: I discontinued dexamethasone pills.  I strongly discouraged her from eating any sugary foods.  She is eating a lot of cakes and pies  Monitoring closely for toxicities. Return to clinic in 3 weeks for cycle 4     No orders of the defined types were placed in this encounter.  The patient has a good understanding of the overall plan. she agrees with it. she will call with any problems that may develop before the next visit here. Total time spent: 30 mins including face to face time and time spent for planning, charting and co-ordination of care   ViHarriette OharaMD 10/25/20

## 2020-10-25 NOTE — Progress Notes (Signed)
[place] CSW Progress Note  Clinical Social Worker went to see patient in infusion to follow up on coping and resources. Patient sleeping. Left [employer] cards with pt's personal items.   Edwinna Areola Kameela Leipold , LCSW

## 2020-10-25 NOTE — Telephone Encounter (Signed)
Called to discuss Amanda Rich's interest in genetic testing. LVM requesting that she call back.

## 2020-10-25 NOTE — Patient Instructions (Signed)

## 2020-10-25 NOTE — Assessment & Plan Note (Signed)
Patient palpated a right breast lump x80yrand a left breast lump x281yrDiagnostic mammogram and USKoreahowed in the right breast, a 2.7cm mass 5cm from the nipple and 0.8cm mass 1cm from the nipple at the 12:30 position, with borderline cortical thickening in the right axilla, and in the left breast, a 4.0cm mass at the 11 o'clock position representing a hamartoma. 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  Treatment plan: 1. Neoadjuvant chemotherapy with TCH Perjeta 6 cycles followed by Herceptin maintenance for 1 year 2. Followed by breast conserving surgery if possible with sentinel lymph node study 3. Followed by adjuvant radiation therapy if patient had lumpectomy 4.Followed by adjuvant antiestrogen therapy ------------------------------------------------------------------------------------------------------------------------------------------- Current treatment: Cycle 3TCH Perjeta Echocardiogram 09/06/2020: EF 60 to 65%  Chemo toxicities: 1.Diarrhea: She now knows how to manage diarrhea with Imodium. 2.nausea and vomiting: Improved after taking Prilosec for heartburn. 3.Fatigue 4.Lack of taste and appetite: Unfortunately this is limiting her food choices.   She is seeing the dietitian today. 5.  Elevated LFTs: Okay to treat.  We will watch and monitor. 6.  Chemo induced anemia: Monitoring 7.  Elevated blood sugars: I discontinued dexamethasone pills.  Monitoring closely for toxicities. Return to clinic in 3 weeks for cycle 4

## 2020-10-27 ENCOUNTER — Other Ambulatory Visit: Payer: Self-pay

## 2020-10-27 ENCOUNTER — Telehealth: Payer: Self-pay | Admitting: Hematology and Oncology

## 2020-10-27 ENCOUNTER — Inpatient Hospital Stay: Payer: Medicaid Other

## 2020-10-27 VITALS — BP 128/72 | HR 72 | Temp 98.2°F | Resp 18

## 2020-10-27 DIAGNOSIS — Z5111 Encounter for antineoplastic chemotherapy: Secondary | ICD-10-CM | POA: Diagnosis not present

## 2020-10-27 DIAGNOSIS — D6481 Anemia due to antineoplastic chemotherapy: Secondary | ICD-10-CM | POA: Diagnosis not present

## 2020-10-27 DIAGNOSIS — Z5112 Encounter for antineoplastic immunotherapy: Secondary | ICD-10-CM | POA: Diagnosis not present

## 2020-10-27 DIAGNOSIS — Z170 Estrogen receptor positive status [ER+]: Secondary | ICD-10-CM

## 2020-10-27 DIAGNOSIS — C50211 Malignant neoplasm of upper-inner quadrant of right female breast: Secondary | ICD-10-CM | POA: Diagnosis not present

## 2020-10-27 MED ORDER — PEGFILGRASTIM-CBQV 6 MG/0.6ML ~~LOC~~ SOSY
PREFILLED_SYRINGE | SUBCUTANEOUS | Status: AC
  Filled 2020-10-27: qty 0.6

## 2020-10-27 MED ORDER — PEGFILGRASTIM-CBQV 6 MG/0.6ML ~~LOC~~ SOSY
6.0000 mg | PREFILLED_SYRINGE | Freq: Once | SUBCUTANEOUS | Status: AC
  Administered 2020-10-27: 6 mg via SUBCUTANEOUS

## 2020-10-27 NOTE — Telephone Encounter (Signed)
No 11/1 los, no changes made to pt schedule

## 2020-10-27 NOTE — Patient Instructions (Signed)
Pegfilgrastim (Udenyca) injection What is this medicine? PEGFILGRASTIM (PEG fil gra stim) is a long-acting granulocyte colony-stimulating factor that stimulates the growth of neutrophils, a type of white blood cell important in the body's fight against infection. It is used to reduce the incidence of fever and infection in patients with certain types of cancer who are receiving chemotherapy that affects the bone marrow, and to increase survival after being exposed to high doses of radiation. This medicine may be used for other purposes; ask your health care provider or pharmacist if you have questions. COMMON BRAND NAME(S): Steve Rattler, Ziextenzo What should I tell my health care provider before I take this medicine? They need to know if you have any of these conditions:  kidney disease  latex allergy  ongoing radiation therapy  sickle cell disease  skin reactions to acrylic adhesives (On-Body Injector only)  an unusual or allergic reaction to pegfilgrastim, filgrastim, other medicines, foods, dyes, or preservatives  pregnant or trying to get pregnant  breast-feeding How should I use this medicine? This medicine is for injection under the skin. If you get this medicine at home, you will be taught how to prepare and give the pre-filled syringe or how to use the On-body Injector. Refer to the patient Instructions for Use for detailed instructions. Use exactly as directed. Tell your healthcare provider immediately if you suspect that the On-body Injector may not have performed as intended or if you suspect the use of the On-body Injector resulted in a missed or partial dose. It is important that you put your used needles and syringes in a special sharps container. Do not put them in a trash can. If you do not have a sharps container, call your pharmacist or healthcare provider to get one. Talk to your pediatrician regarding the use of this medicine in children. While this drug  may be prescribed for selected conditions, precautions do apply. Overdosage: If you think you have taken too much of this medicine contact a poison control center or emergency room at once. NOTE: This medicine is only for you. Do not share this medicine with others. What if I miss a dose? It is important not to miss your dose. Call your doctor or health care professional if you miss your dose. If you miss a dose due to an On-body Injector failure or leakage, a new dose should be administered as soon as possible using a single prefilled syringe for manual use. What may interact with this medicine? Interactions have not been studied. Give your health care provider a list of all the medicines, herbs, non-prescription drugs, or dietary supplements you use. Also tell them if you smoke, drink alcohol, or use illegal drugs. Some items may interact with your medicine. This list may not describe all possible interactions. Give your health care provider a list of all the medicines, herbs, non-prescription drugs, or dietary supplements you use. Also tell them if you smoke, drink alcohol, or use illegal drugs. Some items may interact with your medicine. What should I watch for while using this medicine? You may need blood work done while you are taking this medicine. If you are going to need a MRI, CT scan, or other procedure, tell your doctor that you are using this medicine (On-Body Injector only). What side effects may I notice from receiving this medicine? Side effects that you should report to your doctor or health care professional as soon as possible:  allergic reactions like skin rash, itching or hives, swelling of  the face, lips, or tongue  back pain  dizziness  fever  pain, redness, or irritation at site where injected  pinpoint red spots on the skin  red or dark-brown urine  shortness of breath or breathing problems  stomach or side pain, or pain at the  shoulder  swelling  tiredness  trouble passing urine or change in the amount of urine Side effects that usually do not require medical attention (report to your doctor or health care professional if they continue or are bothersome):  bone pain  muscle pain This list may not describe all possible side effects. Call your doctor for medical advice about side effects. You may report side effects to FDA at 1-800-FDA-1088. Where should I keep my medicine? Keep out of the reach of children. If you are using this medicine at home, you will be instructed on how to store it. Throw away any unused medicine after the expiration date on the label. NOTE: This sheet is a summary. It may not cover all possible information. If you have questions about this medicine, talk to your doctor, pharmacist, or health care provider.  2020 Elsevier/Gold Standard (2018-03-18 16:57:08)

## 2020-10-30 ENCOUNTER — Emergency Department (HOSPITAL_COMMUNITY): Payer: Medicaid Other

## 2020-10-30 ENCOUNTER — Other Ambulatory Visit: Payer: Self-pay

## 2020-10-30 ENCOUNTER — Encounter (HOSPITAL_COMMUNITY): Payer: Self-pay

## 2020-10-30 ENCOUNTER — Emergency Department (HOSPITAL_COMMUNITY)
Admission: EM | Admit: 2020-10-30 | Discharge: 2020-10-30 | Disposition: A | Discharge status: Home / Self Care | Payer: Medicaid Other | Attending: Emergency Medicine | Admitting: Emergency Medicine

## 2020-10-30 DIAGNOSIS — R059 Cough, unspecified: Secondary | ICD-10-CM | POA: Insufficient documentation

## 2020-10-30 DIAGNOSIS — Z20822 Contact with and (suspected) exposure to covid-19: Secondary | ICD-10-CM | POA: Diagnosis not present

## 2020-10-30 DIAGNOSIS — F17210 Nicotine dependence, cigarettes, uncomplicated: Secondary | ICD-10-CM | POA: Insufficient documentation

## 2020-10-30 DIAGNOSIS — R000 Tachycardia, unspecified: Secondary | ICD-10-CM | POA: Diagnosis not present

## 2020-10-30 DIAGNOSIS — C50919 Malignant neoplasm of unspecified site of unspecified female breast: Secondary | ICD-10-CM | POA: Diagnosis not present

## 2020-10-30 DIAGNOSIS — R0602 Shortness of breath: Secondary | ICD-10-CM | POA: Insufficient documentation

## 2020-10-30 DIAGNOSIS — R002 Palpitations: Secondary | ICD-10-CM | POA: Diagnosis not present

## 2020-10-30 LAB — COMPREHENSIVE METABOLIC PANEL
ALT: 166 U/L — ABNORMAL HIGH (ref 0–44)
AST: 50 U/L — ABNORMAL HIGH (ref 15–41)
Albumin: 4.1 g/dL (ref 3.5–5.0)
Alkaline Phosphatase: 91 U/L (ref 38–126)
Anion gap: 9 (ref 5–15)
BUN: 10 mg/dL (ref 6–20)
CO2: 27 mmol/L (ref 22–32)
Calcium: 9.1 mg/dL (ref 8.9–10.3)
Chloride: 101 mmol/L (ref 98–111)
Creatinine, Ser: 0.6 mg/dL (ref 0.44–1.00)
GFR, Estimated: >60 mL/min (ref >60)
Glucose, Bld: 106 mg/dL — ABNORMAL HIGH (ref 70–99)
Potassium: 4.1 mmol/L (ref 3.5–5.1)
Sodium: 137 mmol/L (ref 135–145)
Total Bilirubin: 0.9 mg/dL (ref 0.3–1.2)
Total Protein: 7.3 g/dL (ref 6.5–8.1)

## 2020-10-30 LAB — URINALYSIS, ROUTINE W REFLEX MICROSCOPIC
Bilirubin Urine: NEGATIVE
Glucose, UA: NEGATIVE mg/dL
Ketones, ur: NEGATIVE mg/dL
Nitrite: NEGATIVE
Protein, ur: NEGATIVE mg/dL
Specific Gravity, Urine: 1.013 (ref 1.005–1.030)
pH: 6 (ref 5.0–8.0)

## 2020-10-30 LAB — CBC WITH DIFFERENTIAL/PLATELET
Abs Immature Granulocytes: 0.85 10*3/uL — ABNORMAL HIGH (ref 0.00–0.07)
Basophils Absolute: 0 10*3/uL (ref 0.0–0.1)
Basophils Relative: 0 %
Eosinophils Absolute: 0.1 10*3/uL (ref 0.0–0.5)
Eosinophils Relative: 1 %
HCT: 32.4 % — ABNORMAL LOW (ref 36.0–46.0)
Hemoglobin: 10.7 g/dL — ABNORMAL LOW (ref 12.0–15.0)
Immature Granulocytes: 13 %
Lymphocytes Relative: 15 %
Lymphs Abs: 1 10*3/uL (ref 0.7–4.0)
MCH: 29.2 pg (ref 26.0–34.0)
MCHC: 33 g/dL (ref 30.0–36.0)
MCV: 88.3 fL (ref 80.0–100.0)
Monocytes Absolute: 0.2 10*3/uL (ref 0.1–1.0)
Monocytes Relative: 2 %
Neutro Abs: 4.3 10*3/uL (ref 1.7–7.7)
Neutrophils Relative %: 69 %
Platelets: 136 10*3/uL — ABNORMAL LOW (ref 150–400)
RBC: 3.67 MIL/uL — ABNORMAL LOW (ref 3.87–5.11)
RDW: 15.7 % — ABNORMAL HIGH (ref 11.5–15.5)
WBC Morphology: INCREASED
WBC: 6.4 10*3/uL (ref 4.0–10.5)
nRBC: 0 % (ref 0.0–0.2)

## 2020-10-30 LAB — RESPIRATORY PANEL BY RT PCR (FLU A&B, COVID)
Influenza A by PCR: NEGATIVE
Influenza B by PCR: NEGATIVE
SARS Coronavirus 2 by RT PCR: NEGATIVE

## 2020-10-30 LAB — LACTIC ACID, PLASMA: Lactic Acid, Venous: 1.2 mmol/L (ref 0.5–1.9)

## 2020-10-30 IMAGING — DX DG CHEST 1V PORT
1 series · 1 of 1 positions shown · non-contrast
Comparison: [DATE]

CLINICAL DATA: Cough.  Palpitations.

EXAM:
PORTABLE CHEST 1 VIEW

[chest ap]
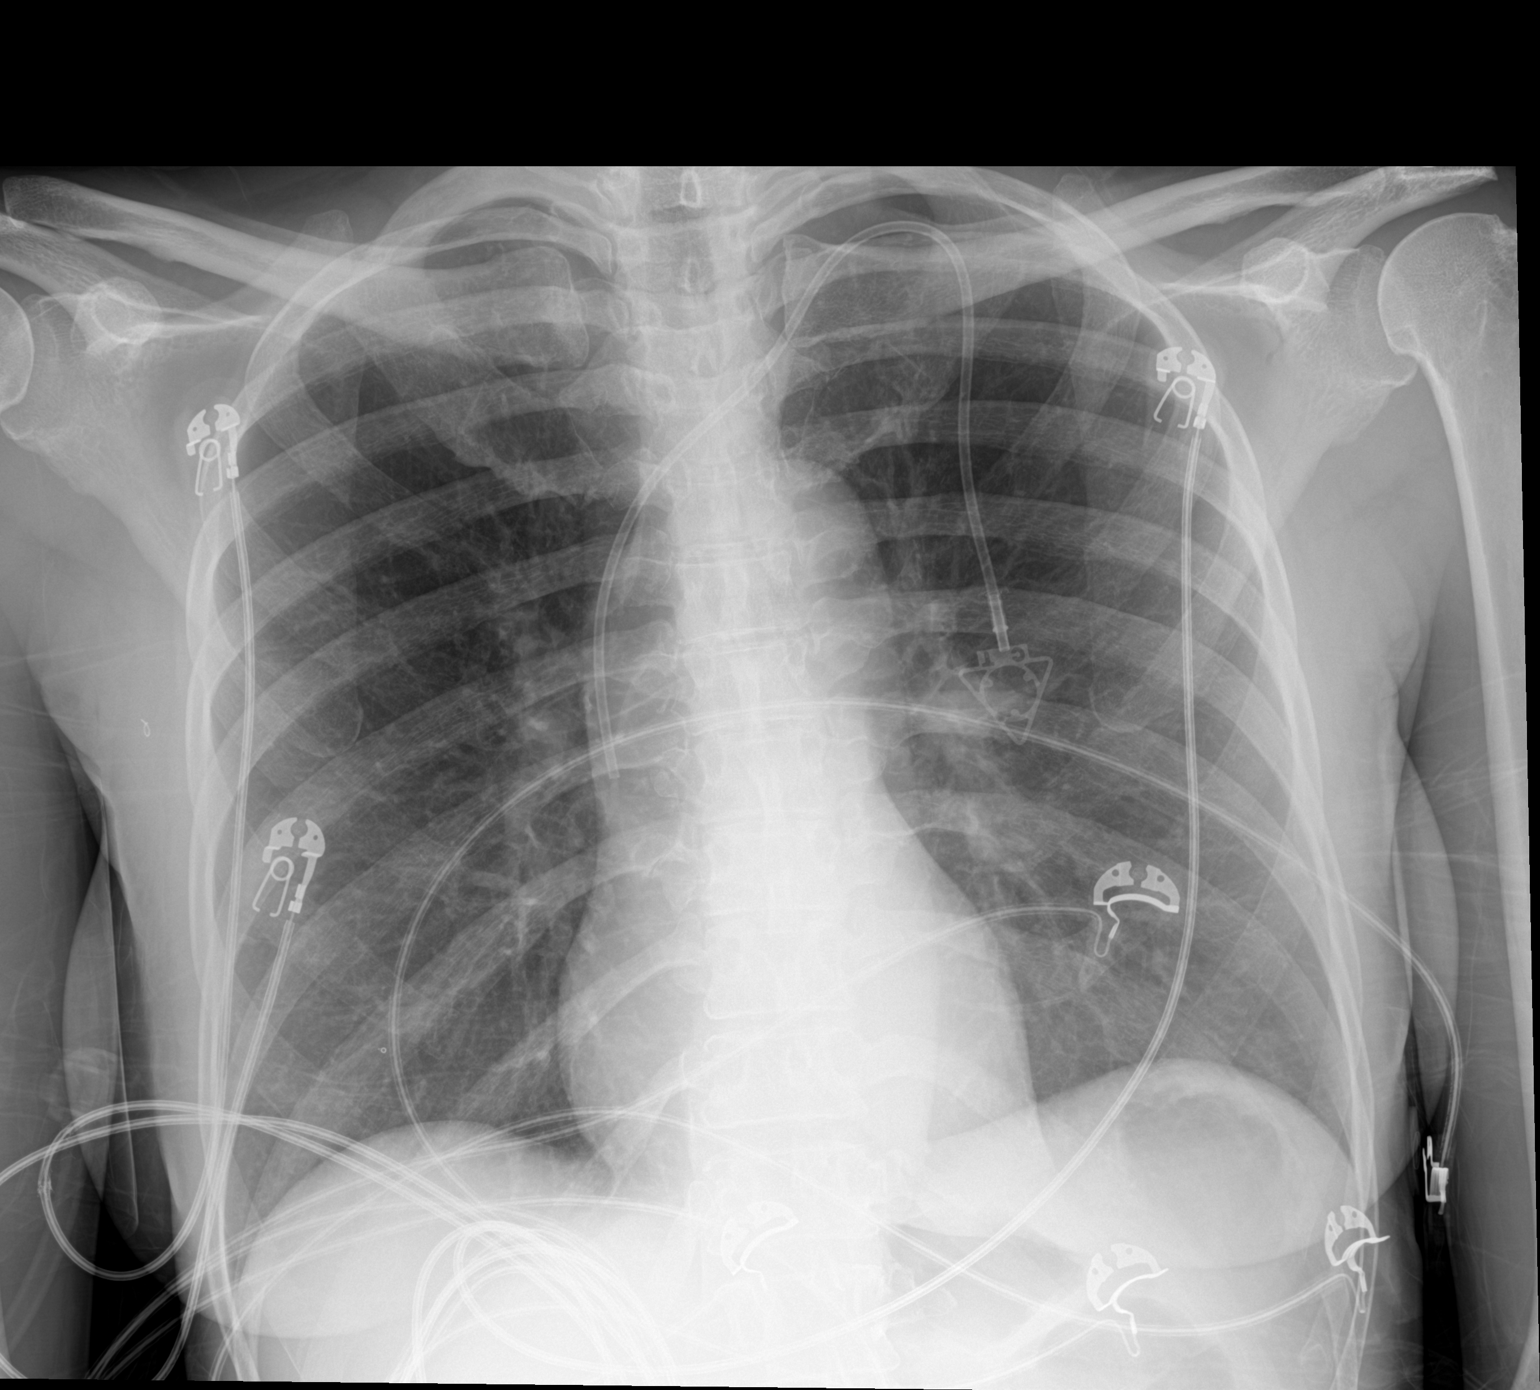

[1 of 1 positions shown; findings below may reference images not displayed]

FINDINGS: Left chest wall port a catheter noted with tip in the cavoatrial
junction. Normal heart size. No pleural effusion or edema
identified. No airspace densities identified. The visualized osseous
structures appear intact.
IMPRESSION: No active disease.

## 2020-10-30 IMAGING — CT CT ANGIO CHEST
2 of 6 series · 18 of 36 positions shown · IV contrast (OMNIPAQUE 350)
Comparison: [DATE]

CLINICAL DATA: Cough since last night, intermittent palpitations,
breast cancer, smoker

EXAM:
CT ANGIOGRAPHY CHEST WITH CONTRAST
TECHNIQUE: Multidetector CT imaging of the chest was performed using the
standard protocol during bolus administration of intravenous
contrast. Multiplanar CT image reconstructions and MIPs were
obtained to evaluate the vascular anatomy.
CONTRAST:  100mL OMNIPAQUE IOHEXOL 350 MG/ML SOLN IV

[Series 6: thins · axial · 0.75mm/px · z∈[+1250,+1514]mm · 17 of 298 slices shown]
[im 17/298  lung]
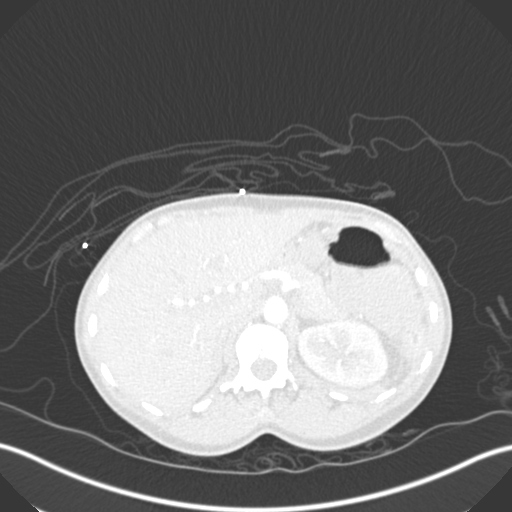
[im 34/298  mediastinal]
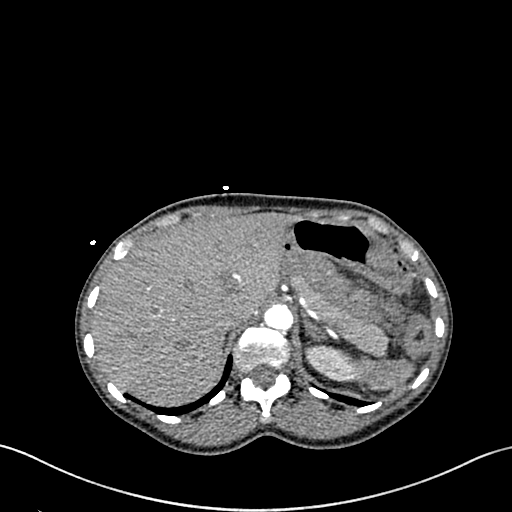
[im 50/298  lung]
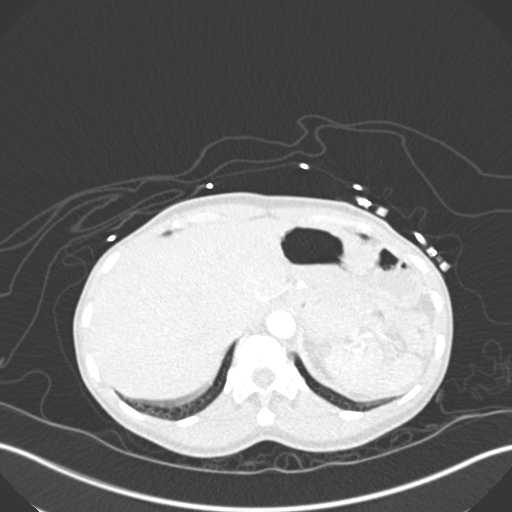
[im 67/298  mediastinal]
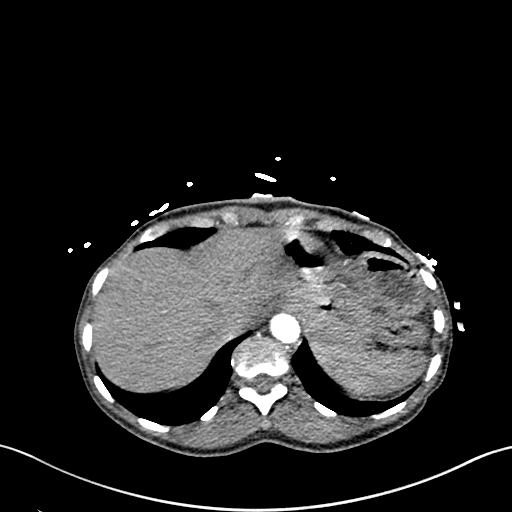
[im 83/298  lung]
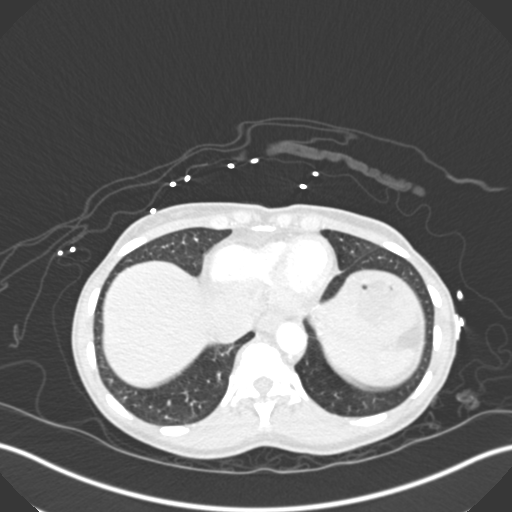
[im 100/298  mediastinal]
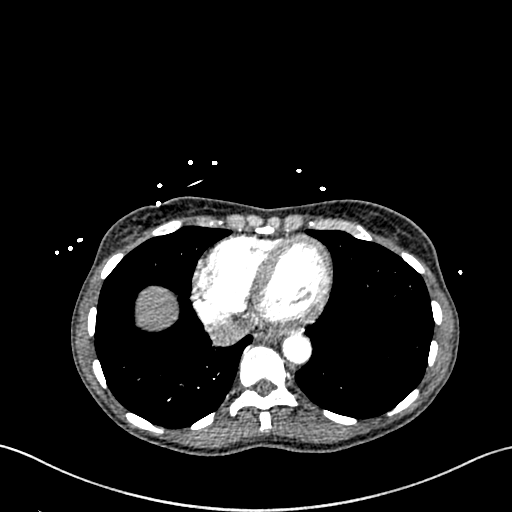
[im 116/298  lung]
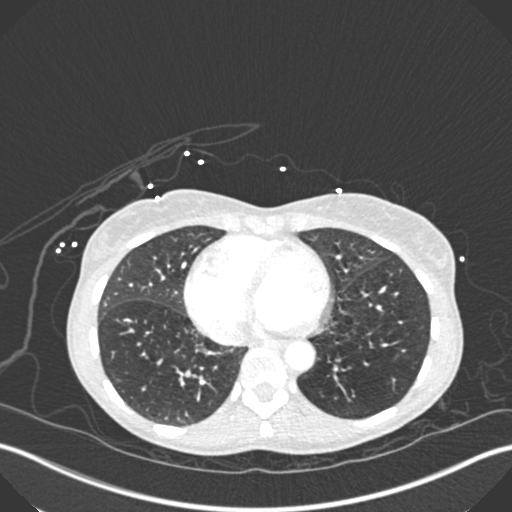
[im 133/298  mediastinal]
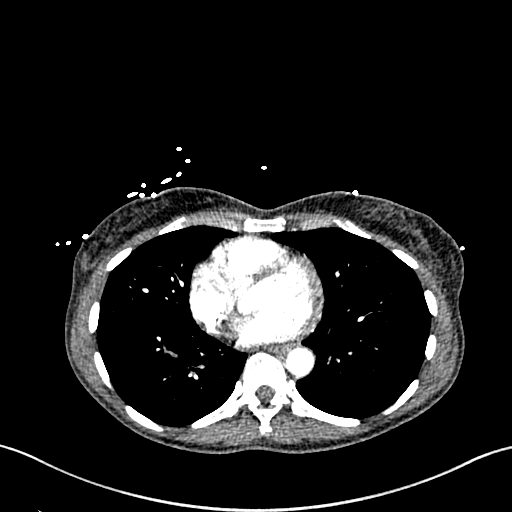
[im 149/298  lung]
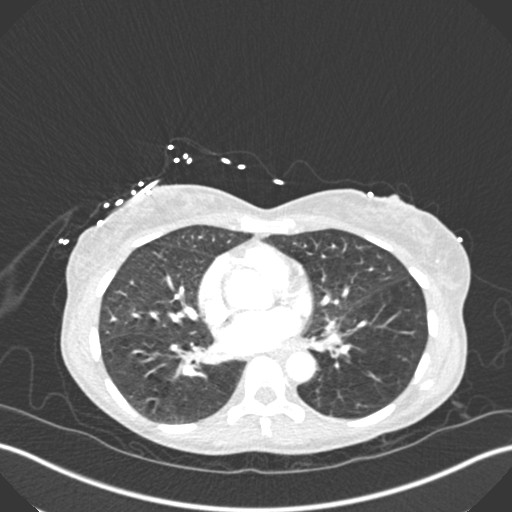
[im 166/298  mediastinal]
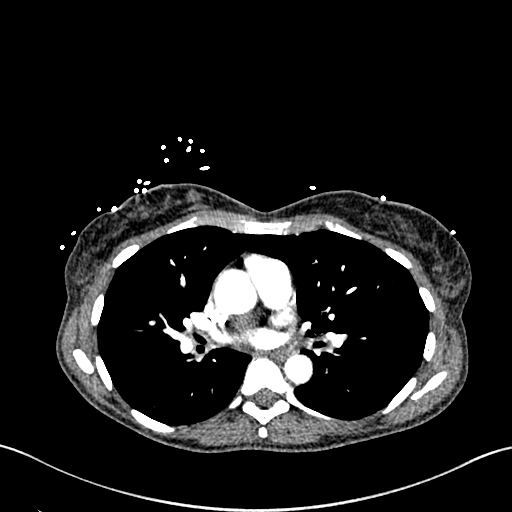
[im 182/298  lung]
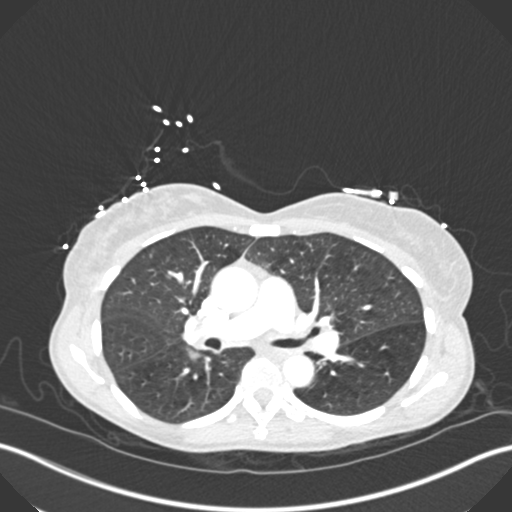
[im 199/298  mediastinal]
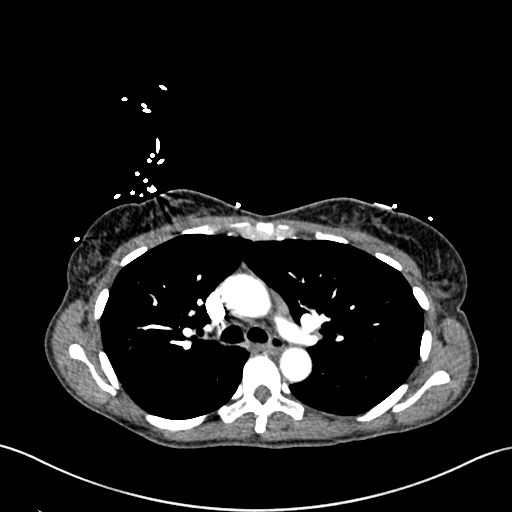
[im 215/298  lung]
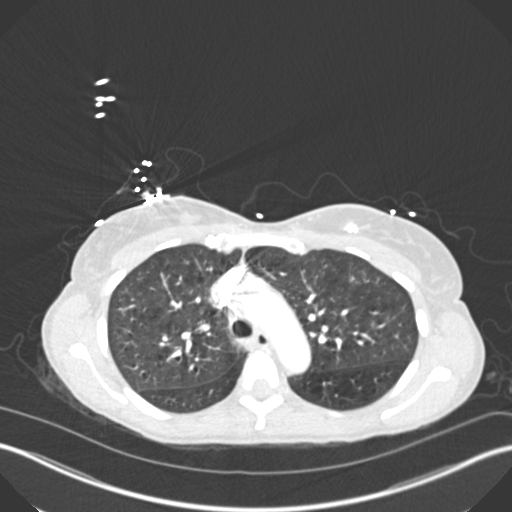
[im 232/298  mediastinal]
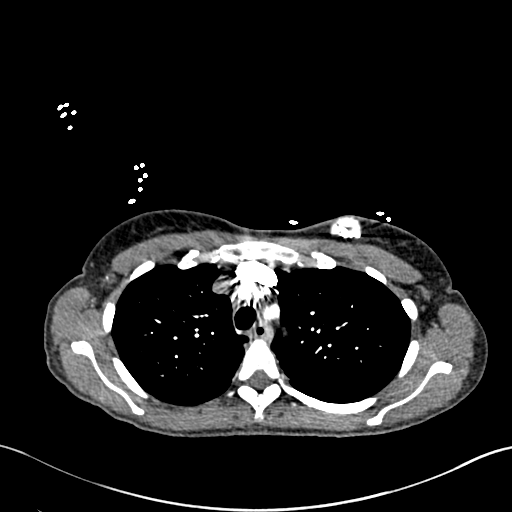
[im 248/298  lung]
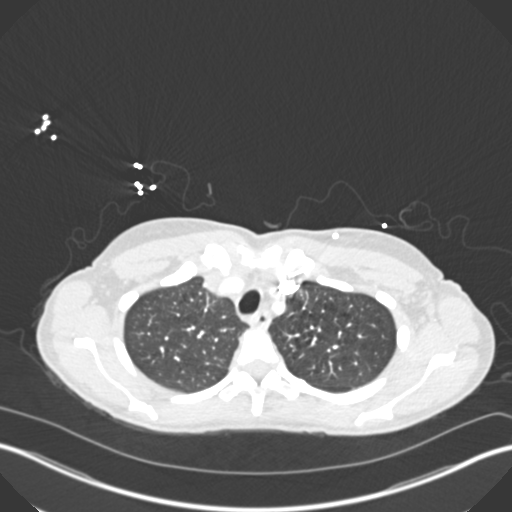
[im 265/298  mediastinal]
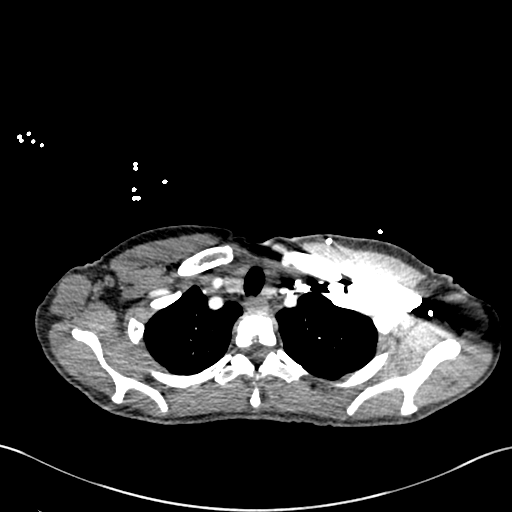
[im 281/298  lung]
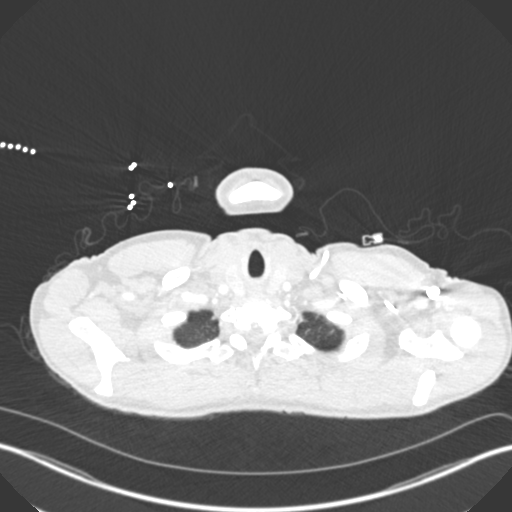

[Series 8: coronal mpr · coronal · 0.70mm/px · 1 of 115 slices shown]
[im 58/115  mediastinal]
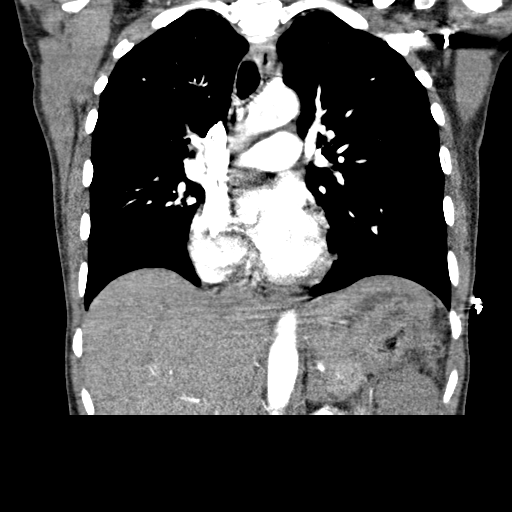

[18 of 36 positions shown; findings below may reference images not displayed]

FINDINGS: Cardiovascular: Aorta normal caliber without aneurysm or dissection.
Heart unremarkable. No pericardial effusion. Pulmonary arteries
adequately opacified and patent. No evidence of pulmonary embolism.

Mediastinum/Nodes: Esophagus unremarkable. 5 mm RIGHT thyroid
nodule; Not clinically significant; no follow-up imaging recommended
(ref: [HOSPITAL]. [DATE]): 143-50).(ref: [HOSPITAL]. [DATE]): 143-50). Base of cervical region otherwise
normal appearance. No thoracic adenopathy.

Lungs/Pleura: Mild central peribronchial thickening. Lungs clear. No
pulmonary infiltrate, pleural effusion, pneumothorax or mass.

Upper Abdomen: Unremarkable

Musculoskeletal: Unremarkable

Review of the MIP images confirms the above findings.
IMPRESSION: No evidence of pulmonary embolism.

Mild bronchitic changes.

Otherwise negative exam.

## 2020-10-30 MED ORDER — IOHEXOL 350 MG/ML SOLN
100.0000 mL | Freq: Once | INTRAVENOUS | Status: AC | PRN
  Administered 2020-10-30: 100 mL via INTRAVENOUS

## 2020-10-30 MED ORDER — SODIUM CHLORIDE 0.9 % IV BOLUS
500.0000 mL | Freq: Once | INTRAVENOUS | Status: AC
  Administered 2020-10-30: 500 mL via INTRAVENOUS

## 2020-10-30 NOTE — Discharge Instructions (Addendum)
Please return for any problem.  °

## 2020-10-30 NOTE — ED Triage Notes (Signed)
Pt c/o cough since last night and intermittent feelings of palpitations. Denies any vomiting, states she has had some bouts of diarrhea the last few days, but typically happens after her chemo. Last chemo Monday.

## 2020-10-30 NOTE — ED Provider Notes (Signed)
[facility] DEPT Provider Note   CSN: [id number] Arrival date & time: 10/30/20  1039     History Chief Complaint  Patient presents with  . Palpitations  . Cough    Amanda Rich is a 54 y.o. female.  54 year old female with prior medical history as detailed below presents for evaluation of reported cough with associated mild shortness of breath.  Patient reports onset of symptoms last night.  Patient denies current shortness of breath.  Patient denies production with her cough.  Amanda Rich denies fever.  Patient reports that the coughing came "out of nowhere."    The history is provided by the patient and medical records.  Cough Cough characteristics:  Non-productive Sputum characteristics:  Nondescript Severity:  Moderate Onset quality:  Sudden Duration:  1 day Timing:  Constant Progression:  Worsening Chronicity:  New Relieved by:  Nothing Worsened by:  Nothing      Past Medical History:  Diagnosis Date  . Anemia   . Depression   . Family history of breast cancer   . Family history of liver cancer   . Smoker   . Umbilical hernia     Patient Active Problem List   Diagnosis Date Noted  . Family history of breast cancer   . Family history of liver cancer   . Malignant neoplasm of 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, estrogen receptor positive ([place]) 08/25/2020  . Abnormal chest CT 01/30/2019  . History of pericarditis 01/30/2019  . Pericarditis 01/22/2019  . Chest pain 12/26/2018  . History of endometrial ablation 01/21/2018  . Family history of colon cancer 09/24/2017  . Poor diet 01/24/2017  . Weight loss, abnormal 01/24/2017  . Smoker 09/22/2016  . Mixed hyperlipidemia 08/07/2016  . Irregular intermenstrual bleeding 08/07/2016  . Depression 08/07/2016  . Fat necrosis of peritoneum ([place]) 08/07/2016  . Pelvic congestion syndrome 08/07/2016    Past Surgical History:  Procedure Laterality Date  . ABLATION ON  ENDOMETRIOSIS    . GANGLION CYST EXCISION  2007   L wrist; [facility], [place]  . IR IMAGING GUIDED PORT INSERTION  09/10/2020  . TUBAL LIGATION     [facility]  . UMBILICAL HERNIA REPAIR  2005   [facility]     OB History   No obstetric history on file.     Family History  Problem Relation Age of Onset  . Diabetes Mother   . Hypertension Mother   . Liver cancer Daughter 23  . Diabetes Maternal Aunt   . Breast cancer Maternal Aunt 37  . Colon cancer Maternal Uncle 66       or prostate cancer  . Colon cancer Cousin   . Breast cancer Cousin   . Autism Half-Brother   . Breast cancer Other 31       maternal second cousin  . Diabetes Paternal Uncle   . Breast cancer Other        dx. 63s, paternal second cousin  . Lung cancer Other        dx. 59s, paternal cousin once removed (father's cousin)    Social History   Tobacco Use  . Smoking status: Current Some Day Smoker    Packs/day: 1.00    Years: 25.00    Pack years: 25.00    Types: Cigarettes  . Smokeless tobacco: Never Used  . Tobacco comment: smoking cigarettes again and not e-cigs  Vaping Use  . Vaping Use: Never used  Substance Use Topics  . Alcohol use: Yes  Alcohol/week: 1.0 standard drink    Types: 1 Glasses of wine per week  . Drug use: No    Home Medications Prior to Admission medications   Medication Sig Start Date End Date Taking? Authorizing Provider  albuterol (PROVENTIL HFA;VENTOLIN HFA) 108 (90 Base) MCG/ACT inhaler Inhale 2 puffs into the lungs every 6 (six) hours as needed for wheezing or shortness of breath. 01/30/19   Tysinger, Camelia Eng, PA-C  Biotin 5000 MCG CAPS Take 1 capsule by mouth daily.     [provider]  Cholecalciferol (VITAMIN D3) 2000 units TABS Take 1 tablet by mouth daily.     [provider]  citalopram (CELEXA) 10 MG tablet Take 1 tablet (10 mg total) by mouth daily. 09/13/20   Nicholas Lose, MD  lidocaine-prilocaine (EMLA) cream Apply to affected area once 09/01/20   Nicholas Lose,  MD  loperamide (IMODIUM) 2 MG capsule Take 1 capsule (2 mg total) by mouth as needed for diarrhea or loose stools. 10/04/20   Nicholas Lose, MD  loratadine (CLARITIN) 10 MG tablet Take 1 tablet (10 mg total) by mouth daily. 10/04/20   Nicholas Lose, MD  LORazepam (ATIVAN) 0.5 MG tablet Take 1 tablet (0.5 mg total) by mouth at bedtime as needed (Nausea or vomiting). 09/01/20   Nicholas Lose, MD  Multiple Vitamin (MULTIVITAMIN WITH MINERALS) TABS Take 1 tablet by mouth daily.    [provider]  omeprazole (PRILOSEC) 20 MG capsule Take 1 capsule (20 mg total) by mouth daily. 10/04/20   Nicholas Lose, MD  ondansetron (ZOFRAN) 8 MG tablet Take 1 tablet (8 mg total) by mouth 2 (two) times daily as needed (Nausea or vomiting). Start on the third day after chemotherapy. 09/01/20   Nicholas Lose, MD  Prenatal Vit-DSS-Fe Cbn-FA (PRENATAL ADVANTAGE PO) Take by mouth.    [provider]  prochlorperazine (COMPAZINE) 10 MG tablet Take 1 tablet (10 mg total) by mouth every 6 (six) hours as needed (Nausea or vomiting). 09/01/20   Nicholas Lose, MD  TURMERIC PO Take 1 tablet by mouth daily.     [provider]    Allergies    Clarithromycin  Review of Systems   Review of Systems  Respiratory: Positive for cough.   All other systems reviewed and are negative.   Physical Exam Updated Vital Signs BP 104/76   Pulse 97   Temp 98.4 F (36.9 C) (Oral)   Resp (!) 22   SpO2 100%   Physical Exam Vitals and nursing note reviewed.  Constitutional:      General: Amanda Rich is not in acute distress.    Appearance: Normal appearance. Amanda Rich is well-developed.  HENT:     Head: Normocephalic and atraumatic.  Eyes:     Conjunctiva/sclera: Conjunctivae normal.     Pupils: Pupils are equal, round, and reactive to light.  Cardiovascular:     Rate and Rhythm: Normal rate and regular rhythm.     Heart sounds: Normal heart sounds.  Pulmonary:     Effort: Pulmonary effort is normal. No respiratory  distress.     Breath sounds: Normal breath sounds.  Abdominal:     General: There is no distension.     Palpations: Abdomen is soft.     Tenderness: There is no abdominal tenderness.  Musculoskeletal:        General: No deformity. Normal range of motion.     Cervical back: Normal range of motion and neck supple.  Skin:    General: Skin is warm  and dry.  Neurological:     Mental Status: Amanda Rich is alert and oriented to person, place, and time.     ED Results / Procedures / Treatments   Labs (all labs ordered are listed, but only abnormal results are displayed) Labs Reviewed  COMPREHENSIVE METABOLIC PANEL - Abnormal; Notable for the following components:      Result Value   Glucose, Bld 106 (*)    AST 50 (*)    ALT 166 (*)    All other components within normal limits  CBC WITH DIFFERENTIAL/PLATELET - Abnormal; Notable for the following components:   RBC 3.67 (*)    Hemoglobin 10.7 (*)    HCT 32.4 (*)    RDW 15.7 (*)    Platelets 136 (*)    Abs Immature Granulocytes 0.85 (*)    All other components within normal limits  URINALYSIS, ROUTINE W REFLEX MICROSCOPIC - Abnormal; Notable for the following components:   Hgb urine dipstick SMALL (*)    Leukocytes,Ua SMALL (*)    Bacteria, UA RARE (*)    All other components within normal limits  RESPIRATORY PANEL BY RT PCR (FLU A&B, COVID)  CULTURE, BLOOD (ROUTINE X 2)  CULTURE, BLOOD (ROUTINE X 2)  LACTIC ACID, PLASMA  LACTIC ACID, PLASMA    EKG EKG Interpretation  Date/Time:  Saturday October 30 2020 11:13:54 EDT Ventricular Rate:  113 PR Interval:    QRS Duration: 77 QT Interval:  307 QTC Calculation: 421 R Axis:   81 Text Interpretation: Sinus tachycardia Confirmed by Dene Gentry [phone]) on 10/30/2020 11:17:57 AM   Radiology CT Angio Chest PE W and/or Wo Contrast  Result Date: 10/30/2020 CLINICAL DATA:  Cough since last night, intermittent palpitations, breast cancer, smoker EXAM: CT ANGIOGRAPHY CHEST WITH CONTRAST  TECHNIQUE: Multidetector CT imaging of the chest was performed using the standard protocol during bolus administration of intravenous contrast. Multiplanar CT image reconstructions and MIPs were obtained to evaluate the vascular anatomy. CONTRAST:  169mL OMNIPAQUE IOHEXOL 350 MG/ML SOLN IV COMPARISON:  12/26/2018 FINDINGS: Cardiovascular: Aorta normal caliber without aneurysm or dissection. Heart unremarkable. No pericardial effusion. Pulmonary arteries adequately opacified and patent. No evidence of pulmonary embolism. Mediastinum/Nodes: Esophagus unremarkable. 5 mm RIGHT thyroid nodule; Not clinically significant; no follow-up imaging recommended (ref: J Am Coll Radiol. 2015 Feb;12(2): 143-50).(ref: J Am Coll Radiol. 2015 Feb;12(2): 143-50). Base of cervical region otherwise normal appearance. No thoracic adenopathy. Lungs/Pleura: Mild central peribronchial thickening. Lungs clear. No pulmonary infiltrate, pleural effusion, pneumothorax or mass. Upper Abdomen: Unremarkable Musculoskeletal: Unremarkable Review of the MIP images confirms the above findings. IMPRESSION: No evidence of pulmonary embolism. Mild bronchitic changes. Otherwise negative exam. Electronically Signed   By: Lavonia Dana M.D.   On: 10/30/2020 14:12   DG Chest Port 1 View  Result Date: 10/30/2020 CLINICAL DATA:  Cough.  Palpitations. EXAM: PORTABLE CHEST 1 VIEW COMPARISON:  12/25/2018 FINDINGS: Left chest wall port a catheter noted with tip in the cavoatrial junction. Normal heart size. No pleural effusion or edema identified. No airspace densities identified. The visualized osseous structures appear intact. IMPRESSION: No active disease. Electronically Signed   By: Kerby Moors M.D.   On: 10/30/2020 11:57    Procedures Procedures (including critical care time)  Medications Ordered in ED Medications  sodium chloride 0.9 % bolus 500 mL (500 mLs Intravenous New Bag/Given 10/30/20 1205)  iohexol (OMNIPAQUE) 350 MG/ML injection 100 mL  (100 mLs Intravenous Contrast Given 10/30/20 1307)    ED Course  I have reviewed  the triage vital signs and the nursing notes.  Pertinent labs & imaging results that were available during my care of the patient were reviewed by me and considered in my medical decision making (see chart for details).    MDM Rules/Calculators/A&P                          MDM  Screen complete   JACKLINE CASTILLA was evaluated in Emergency Department on 10/30/2020 for the symptoms described in the history of present illness. Amanda Rich was evaluated in the context of the global COVID-19 pandemic, which necessitated consideration that the patient might be at risk for infection with the SARS-CoV-2 virus that causes COVID-19. Institutional protocols and algorithms that pertain to the evaluation of patients at risk for COVID-19 are in a state of rapid change based on information released by regulatory bodies including the CDC and federal and state organizations. These policies and algorithms were followed during the patient's care in the ED.  Patient is presenting for evaluation of reported cough.  Patient's work-up in the ED is on the whole without significant acute pathology.  Patient does feel improved following her ED evaluation.  Amanda Rich desires discharge home.  Amanda Rich declines further observation or additional treatment.  Outpatient management of her symptoms was discussed.  Patient does understand need close follow-up.  Strict return precautions given and understood.   Final Clinical Impression(s) / ED Diagnoses Final diagnoses:  Cough    Rx / DC Orders ED Discharge Orders    None       Valarie Merino, MD 10/30/20 1426

## 2020-11-04 ENCOUNTER — Telehealth: Payer: Self-pay | Admitting: *Deleted

## 2020-11-04 ENCOUNTER — Encounter: Payer: Self-pay | Admitting: Licensed Clinical Social Worker

## 2020-11-04 DIAGNOSIS — C50211 Malignant neoplasm of upper-inner quadrant of right female breast: Secondary | ICD-10-CM

## 2020-11-04 DIAGNOSIS — F32A Depression, unspecified: Secondary | ICD-10-CM

## 2020-11-04 DIAGNOSIS — Z170 Estrogen receptor positive status [ER+]: Secondary | ICD-10-CM

## 2020-11-04 LAB — CULTURE, BLOOD (ROUTINE X 2)
Culture: NO GROWTH
Culture: NO GROWTH
Special Requests: ADEQUATE
Special Requests: ADEQUATE

## 2020-11-04 NOTE — Telephone Encounter (Signed)
Received message from social worker stating pt reached out to her with complaints of cough, mouth sores, and increase in pain.  RN attempt x1 to contact pt.  No answer and unable to LVM due to VM not being set up.

## 2020-11-04 NOTE — Progress Notes (Signed)
[place] CSW Progress Note  Clinical Education officer, museum received call from Marcelino Scot with [facility] [phone]) who is helping coordinate care for pt. Per Ms. Timmons, multiple issues right now as follows:  1. Pt with worsening depression (PHQ-9 score of 20). Needing ongoing counseling and med management. 2. Need Personal Care Services/ home aide to help with housekeeping, chores, cooking, etc due to pt's fatigue and pain from treatment 2. Significant pain- 8/9 out of 10 daily that is keeping her from sleeping.  CSW spoke with patient and confirmed above. Patient also states that she has had a very bad cough that caused her to throw up and has some blisters on her tongue and is causing even more body pain from coughing.  Patient would like to speak with medical team regarding the pain and coughing concerns. She also agreed to referrals for personal care services, psychiatry, and counseling (virtual preferred).  Patient also dropped off [employer] application last week but is missing paperwork. CSW informed pt of what parts were missing.  Plan: 1. CSW referring to [facility] for psychiatry and to [place] for therapy 2. CSW provided PCS form to nurse/ MD to complete 3. CSW informed medical team of physical complaints and pt's request for appt next week.  CSW will continue to follow pt for resource and coordination support.   Christeen Douglas , LCSW

## 2020-11-11 ENCOUNTER — Encounter: Payer: Self-pay | Admitting: Licensed Clinical Social Worker

## 2020-11-14 NOTE — Assessment & Plan Note (Deleted)
Patient palpated a right breast lump x73yrand a left breast lump x250yrDiagnostic mammogram and USKoreahowed in the right breast, a 2.7cm mass 5cm from the nipple and 0.8cm mass 1cm from the nipple at the 12:30 position, with borderline cortical thickening in the right axilla, and in the left breast, a 4.0cm mass at the 11 o'clock position representing a hamartoma. 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  Treatment plan: 1. Neoadjuvant chemotherapy with TCH Perjeta 6 cycles followed by Herceptin maintenance for 1 year 2. Followed by breast conserving surgery if possible with sentinel lymph node study 3. Followed by adjuvant radiation therapy if patient had lumpectomy 4.Followed by adjuvant antiestrogen therapy ------------------------------------------------------------------------------------------------------------------------------------------- Current treatment: Cycle 4TCH Perjeta Echocardiogram 09/06/2020: EF 60 to 65%  Chemo toxicities: 1.Diarrhea:She now knows how to manage diarrhea with Imodium. 2.nausea and vomiting:Improved after taking Prilosec for heartburn. 3.Fatigue: Continues to be problem 4.Lack of taste and appetite: She is very sad that for Thanksgiving she may not have good taste. 5.Elevated LFTs:   Slightly better than before.  Okay to treat. 6.Chemo induced anemia: Monitoring 7.Elevated blood sugars: I discontinued dexamethasone pills.  I strongly discouraged her from eating any sugary foods.  She is eating a lot of cakes and pies  Monitoring closely for toxicities. Return to clinic in3 weeks for cycle 5

## 2020-11-15 ENCOUNTER — Inpatient Hospital Stay: Payer: Medicaid Other | Admitting: Hematology and Oncology

## 2020-11-15 ENCOUNTER — Inpatient Hospital Stay: Payer: Medicaid Other

## 2020-11-15 ENCOUNTER — Encounter: Payer: Self-pay | Admitting: Licensed Clinical Social Worker

## 2020-11-15 ENCOUNTER — Inpatient Hospital Stay: Payer: Medicaid Other | Admitting: Nutrition

## 2020-11-15 ENCOUNTER — Telehealth: Payer: Self-pay | Admitting: *Deleted

## 2020-11-15 DIAGNOSIS — C50211 Malignant neoplasm of upper-inner quadrant of right female breast: Secondary | ICD-10-CM

## 2020-11-15 NOTE — Telephone Encounter (Signed)
RN placed call to pt regarding missed apt today.  Pt states she was not feeling well and has cold symptoms.  Pt states she does not need to be seen today for cold symptoms and would prefer to push out her infusion apt by one week.  Pt states she will call the office if cold symptoms persist or worsen. High priority message sent to scheduling to re schedule apts in 1 week and to push out all future apts.

## 2020-11-15 NOTE — Progress Notes (Signed)
[place] application mailed 24/19/9144. Original copies of paperwork mailed back to patient 11/15/2020.    Christeen Douglas, LCSW

## 2020-11-17 ENCOUNTER — Inpatient Hospital Stay: Payer: Medicaid Other

## 2020-11-22 ENCOUNTER — Telehealth: Payer: Self-pay | Admitting: *Deleted

## 2020-11-22 NOTE — Telephone Encounter (Signed)
Pt called regarding chemo schedule. Return pt call, left vm regarding chemo schedule. Scheduling msg sent to correct chemo scheduled. Gave pt correct appt for 12/6 at 7:30am. Contact information provided for further questions.

## 2020-11-22 NOTE — Telephone Encounter (Signed)
Spoke to pt and discussed chemo scheduling. Informed will receive a call with new chemo appts after 12/6 Discussed a walking program during chemo as well as staying active. Informed of chemo timeline as well as next steps after chemo completion. Denies further questions at this time. Encourage pt to call with needs. Received verbal understanding.

## 2020-11-24 DIAGNOSIS — Z419 Encounter for procedure for purposes other than remedying health state, unspecified: Secondary | ICD-10-CM | POA: Diagnosis not present

## 2020-11-26 ENCOUNTER — Other Ambulatory Visit: Payer: Medicaid Other

## 2020-11-26 ENCOUNTER — Ambulatory Visit: Payer: Medicaid Other

## 2020-11-29 ENCOUNTER — Inpatient Hospital Stay: Payer: Medicaid Other

## 2020-11-29 ENCOUNTER — Encounter: Payer: Self-pay | Admitting: Licensed Clinical Social Worker

## 2020-11-29 ENCOUNTER — Other Ambulatory Visit: Payer: Self-pay

## 2020-11-29 ENCOUNTER — Other Ambulatory Visit: Payer: Self-pay | Admitting: *Deleted

## 2020-11-29 ENCOUNTER — Inpatient Hospital Stay: Payer: Medicaid Other | Admitting: Nutrition

## 2020-11-29 ENCOUNTER — Inpatient Hospital Stay: Payer: Medicaid Other | Attending: Hematology and Oncology

## 2020-11-29 ENCOUNTER — Other Ambulatory Visit: Payer: Self-pay | Admitting: Hematology and Oncology

## 2020-11-29 VITALS — BP 112/71 | HR 68 | Temp 97.6°F | Resp 18 | Ht 65.0 in | Wt 125.5 lb

## 2020-11-29 DIAGNOSIS — Z170 Estrogen receptor positive status [ER+]: Secondary | ICD-10-CM

## 2020-11-29 DIAGNOSIS — C50211 Malignant neoplasm of upper-inner quadrant of right female breast: Secondary | ICD-10-CM

## 2020-11-29 DIAGNOSIS — D6481 Anemia due to antineoplastic chemotherapy: Secondary | ICD-10-CM | POA: Insufficient documentation

## 2020-11-29 DIAGNOSIS — Z79899 Other long term (current) drug therapy: Secondary | ICD-10-CM | POA: Insufficient documentation

## 2020-11-29 DIAGNOSIS — Z5111 Encounter for antineoplastic chemotherapy: Secondary | ICD-10-CM | POA: Diagnosis not present

## 2020-11-29 DIAGNOSIS — Z5112 Encounter for antineoplastic immunotherapy: Secondary | ICD-10-CM | POA: Diagnosis not present

## 2020-11-29 LAB — CBC WITH DIFFERENTIAL (CANCER CENTER ONLY)
Abs Immature Granulocytes: 0 10*3/uL (ref 0.00–0.07)
Basophils Absolute: 0 10*3/uL (ref 0.0–0.1)
Basophils Relative: 1 %
Eosinophils Absolute: 0.3 10*3/uL (ref 0.0–0.5)
Eosinophils Relative: 7 %
HCT: 30.1 % — ABNORMAL LOW (ref 36.0–46.0)
Hemoglobin: 9.8 g/dL — ABNORMAL LOW (ref 12.0–15.0)
Immature Granulocytes: 0 %
Lymphocytes Relative: 53 %
Lymphs Abs: 2.1 10*3/uL (ref 0.7–4.0)
MCH: 29.3 pg (ref 26.0–34.0)
MCHC: 32.6 g/dL (ref 30.0–36.0)
MCV: 90.1 fL (ref 80.0–100.0)
Monocytes Absolute: 0.6 10*3/uL (ref 0.1–1.0)
Monocytes Relative: 15 %
Neutro Abs: 1 10*3/uL — ABNORMAL LOW (ref 1.7–7.7)
Neutrophils Relative %: 24 %
Platelet Count: 190 10*3/uL (ref 150–400)
RBC: 3.34 MIL/uL — ABNORMAL LOW (ref 3.87–5.11)
RDW: 15 % (ref 11.5–15.5)
WBC Count: 3.9 10*3/uL — ABNORMAL LOW (ref 4.0–10.5)
nRBC: 0 % (ref 0.0–0.2)

## 2020-11-29 LAB — CMP (CANCER CENTER ONLY)
ALT: 16 U/L (ref 0–44)
AST: 18 U/L (ref 15–41)
Albumin: 3.5 g/dL (ref 3.5–5.0)
Alkaline Phosphatase: 67 U/L (ref 38–126)
Anion gap: 6 (ref 5–15)
BUN: 13 mg/dL (ref 6–20)
CO2: 26 mmol/L (ref 22–32)
Calcium: 8.7 mg/dL — ABNORMAL LOW (ref 8.9–10.3)
Chloride: 108 mmol/L (ref 98–111)
Creatinine: 0.67 mg/dL (ref 0.44–1.00)
GFR, Estimated: >60 mL/min (ref >60)
Glucose, Bld: 85 mg/dL (ref 70–99)
Potassium: 3.4 mmol/L — ABNORMAL LOW (ref 3.5–5.1)
Sodium: 140 mmol/L (ref 135–145)
Total Bilirubin: 0.4 mg/dL (ref 0.3–1.2)
Total Protein: 6.3 g/dL — ABNORMAL LOW (ref 6.5–8.1)

## 2020-11-29 MED ORDER — DIPHENHYDRAMINE HCL 25 MG PO CAPS
ORAL_CAPSULE | ORAL | Status: AC
  Filled 2020-11-29: qty 2

## 2020-11-29 MED ORDER — SODIUM CHLORIDE 0.9 % IV SOLN
60.0000 mg/m2 | Freq: Once | INTRAVENOUS | Status: AC
  Administered 2020-11-29: 90 mg via INTRAVENOUS
  Filled 2020-11-29: qty 9

## 2020-11-29 MED ORDER — ACETAMINOPHEN 325 MG PO TABS
650.0000 mg | ORAL_TABLET | Freq: Once | ORAL | Status: AC
  Administered 2020-11-29: 650 mg via ORAL

## 2020-11-29 MED ORDER — PALONOSETRON HCL INJECTION 0.25 MG/5ML
0.2500 mg | Freq: Once | INTRAVENOUS | Status: AC
  Administered 2020-11-29: 0.25 mg via INTRAVENOUS

## 2020-11-29 MED ORDER — TRASTUZUMAB-ANNS CHEMO 150 MG IV SOLR
300.0000 mg | Freq: Once | INTRAVENOUS | Status: AC
  Administered 2020-11-29: 300 mg via INTRAVENOUS
  Filled 2020-11-29: qty 14.29

## 2020-11-29 MED ORDER — ACETAMINOPHEN 325 MG PO TABS
ORAL_TABLET | ORAL | Status: AC
  Filled 2020-11-29: qty 2

## 2020-11-29 MED ORDER — SODIUM CHLORIDE 0.9 % IV SOLN
420.0000 mg | Freq: Once | INTRAVENOUS | Status: AC
  Administered 2020-11-29: 420 mg via INTRAVENOUS
  Filled 2020-11-29: qty 14

## 2020-11-29 MED ORDER — DIPHENHYDRAMINE HCL 25 MG PO CAPS
50.0000 mg | ORAL_CAPSULE | Freq: Once | ORAL | Status: AC
  Administered 2020-11-29: 50 mg via ORAL

## 2020-11-29 MED ORDER — SODIUM CHLORIDE 0.9 % IV SOLN
150.0000 mg | Freq: Once | INTRAVENOUS | Status: AC
  Administered 2020-11-29: 150 mg via INTRAVENOUS
  Filled 2020-11-29: qty 5
  Filled 2020-11-29: qty 150

## 2020-11-29 MED ORDER — PALONOSETRON HCL INJECTION 0.25 MG/5ML
INTRAVENOUS | Status: AC
  Filled 2020-11-29: qty 5

## 2020-11-29 MED ORDER — SODIUM CHLORIDE 0.9 % IV SOLN
10.0000 mg | Freq: Once | INTRAVENOUS | Status: AC
  Administered 2020-11-29: 10 mg via INTRAVENOUS
  Filled 2020-11-29: qty 1
  Filled 2020-11-29: qty 10

## 2020-11-29 MED ORDER — HEPARIN SOD (PORK) LOCK FLUSH 100 UNIT/ML IV SOLN
500.0000 [IU] | Freq: Once | INTRAVENOUS | Status: AC | PRN
  Administered 2020-11-29: 500 [IU]
  Filled 2020-11-29: qty 5

## 2020-11-29 MED ORDER — SODIUM CHLORIDE 0.9 % IV SOLN
Freq: Once | INTRAVENOUS | Status: AC
  Filled 2020-11-29: qty 250

## 2020-11-29 MED ORDER — SODIUM CHLORIDE 0.9% FLUSH
10.0000 mL | INTRAVENOUS | Status: DC | PRN
  Administered 2020-11-29: 10 mL
  Filled 2020-11-29: qty 10

## 2020-11-29 MED ORDER — SODIUM CHLORIDE 0.9 % IV SOLN
466.5000 mg | Freq: Once | INTRAVENOUS | Status: AC
  Administered 2020-11-29: 470 mg via INTRAVENOUS
  Filled 2020-11-29: qty 47

## 2020-11-29 NOTE — Progress Notes (Signed)
[place] CSW Progress Note  Clinical Social Worker attempted to meet with patient in infusion x2. Patient asleep both times. CSW will attempt to reach patient by phone at a later time.    Christeen Douglas , LCSW

## 2020-11-29 NOTE — Progress Notes (Signed)
Nutrition follow-up completed with patient receiving treatment for ER positive breast cancer. Patient's weight improved and was documented as 125.5 pounds December 6, increased from 121.6 pounds October 11. Patient attributes increased appetite and oral intake secondary to chemotherapy being on hold. She noted improvement in her nutritional impact symptoms. She currently has no questions or concerns.  Nutrition diagnosis: Food and nutrition related knowledge deficit resolved.  I encouraged patient to continue strategies for increased calorie and protein intake.  Encouraged weight maintenance.  Patient has contact information and was encouraged to call RD with questions or concerns.  No follow-up has been scheduled.  **Disclaimer: This note was dictated with voice recognition software. Similar sounding words can inadvertently be transcribed and this note may contain transcription errors which may not have been corrected upon publication of note.**

## 2020-11-29 NOTE — Progress Notes (Signed)
OK to treat with with ANC 1.0 per Dr. Lindi Adie. (dose reduced)

## 2020-11-29 NOTE — Patient Instructions (Signed)
[facility] Discharge Instructions for Patients Receiving Chemotherapy  Today you received the following chemotherapy agents trastuzumab, pertuzumab, docetaxel, carboplatin.  To help prevent nausea and vomiting after your treatment, we encourage you to take your nausea medication as directed.    If you develop nausea and vomiting that is not controlled by your nausea medication, call the clinic.   BELOW ARE SYMPTOMS THAT SHOULD BE REPORTED IMMEDIATELY:  *FEVER GREATER THAN 100.5 F  *CHILLS WITH OR WITHOUT FEVER  NAUSEA AND VOMITING THAT IS NOT CONTROLLED WITH YOUR NAUSEA MEDICATION  *UNUSUAL SHORTNESS OF BREATH  *UNUSUAL BRUISING OR BLEEDING  TENDERNESS IN MOUTH AND THROAT WITH OR WITHOUT PRESENCE OF ULCERS  *URINARY PROBLEMS  *BOWEL PROBLEMS  UNUSUAL RASH Items with * indicate a potential emergency and should be followed up as soon as possible.  Feel free to call the clinic should you have any questions or concerns. The clinic phone number is [phone].  Please show the [place] at check-in to the Emergency Department and triage nurse.

## 2020-11-29 NOTE — Progress Notes (Signed)
Patient completed infusion without incident. In no visible distress at time of discharge. Ambulated out of cancer center. AVS provided.  

## 2020-11-30 ENCOUNTER — Encounter: Payer: Self-pay | Admitting: Licensed Clinical Social Worker

## 2020-11-30 NOTE — Progress Notes (Signed)
[place] CSW Progress Note  Clinical Education officer, museum received TC from patient with questions about forms for SSD application. Pt working with [employer] on disability application, so CSW recommended contacting them which patient has done and will wait for call back today.  Reviewed appt with [facility] on 12/10 at 2pm. Pt received call from [place] but is holding off on counseling for now as she has found a good support group with three other women. She is also taking care of herself by setting boundaries with e-mails, reading books, taking a bath, etc.  Pt missed telephone appt with social services to discuss [place]. She will call to reschedule.  Pt did ask about a prescription for extra-strength Tylenol or something to help with pain after her injection which she receives tomorrow. CSW transferred pt to nurse line for advice.    Christeen Douglas , LCSW

## 2020-12-01 ENCOUNTER — Inpatient Hospital Stay: Payer: Medicaid Other

## 2020-12-01 ENCOUNTER — Other Ambulatory Visit: Payer: Self-pay

## 2020-12-01 VITALS — BP 101/68 | HR 74 | Temp 98.0°F | Resp 18

## 2020-12-01 DIAGNOSIS — Z170 Estrogen receptor positive status [ER+]: Secondary | ICD-10-CM | POA: Diagnosis not present

## 2020-12-01 DIAGNOSIS — C50211 Malignant neoplasm of upper-inner quadrant of right female breast: Secondary | ICD-10-CM

## 2020-12-01 DIAGNOSIS — Z79899 Other long term (current) drug therapy: Secondary | ICD-10-CM | POA: Diagnosis not present

## 2020-12-01 DIAGNOSIS — Z5112 Encounter for antineoplastic immunotherapy: Secondary | ICD-10-CM | POA: Diagnosis not present

## 2020-12-01 DIAGNOSIS — Z5111 Encounter for antineoplastic chemotherapy: Secondary | ICD-10-CM | POA: Diagnosis not present

## 2020-12-01 DIAGNOSIS — D6481 Anemia due to antineoplastic chemotherapy: Secondary | ICD-10-CM | POA: Diagnosis not present

## 2020-12-01 MED ORDER — PEGFILGRASTIM-CBQV 6 MG/0.6ML ~~LOC~~ SOSY
6.0000 mg | PREFILLED_SYRINGE | Freq: Once | SUBCUTANEOUS | Status: AC
  Administered 2020-12-01: 6 mg via SUBCUTANEOUS

## 2020-12-01 MED ORDER — PEGFILGRASTIM-CBQV 6 MG/0.6ML ~~LOC~~ SOSY
PREFILLED_SYRINGE | SUBCUTANEOUS | Status: AC
  Filled 2020-12-01: qty 0.6

## 2020-12-01 NOTE — Patient Instructions (Signed)

## 2020-12-03 ENCOUNTER — Telehealth (INDEPENDENT_AMBULATORY_CARE_PROVIDER_SITE_OTHER): Payer: Medicaid Other | Admitting: Physician Assistant

## 2020-12-03 ENCOUNTER — Other Ambulatory Visit: Payer: Self-pay

## 2020-12-03 DIAGNOSIS — F339 Major depressive disorder, recurrent, unspecified: Secondary | ICD-10-CM | POA: Diagnosis not present

## 2020-12-03 NOTE — Progress Notes (Signed)
Psychiatric Initial Adult Assessment   Virtual Visit via Telephone Note  I connected with Amanda Rich on 12/03/2020 at  2:00 PM EST by telephone and verified that I am speaking with the correct person using two identifiers.  Location: Patient: Home Provider: Office   I discussed the limitations, risks, security and privacy concerns of performing an evaluation and management service by telephone and the availability of in person appointments. I also discussed with the patient that there may be a patient responsible charge related to this service. The patient expressed understanding and agreed to proceed.  Follow Up Instructions:   I discussed the assessment and treatment plan with the patient. The patient was provided an opportunity to ask questions and all were answered. The patient agreed with the plan and demonstrated an understanding of the instructions.   The patient was advised to call back or seek an in-person evaluation if the symptoms worsen or if the condition fails to improve as anticipated.  I provided 45 minutes of non-face-to-face time during this encounter.  Malachy Mood, PA   Patient Identification: Amanda Rich MRN:  [id number] Date of Evaluation:  12/03/2020 Referral Source: [place] Chief Complaint:  Medication management regarding depression Visit Diagnosis: No diagnosis found.  History of Present Illness:   Amanda Rich is a 54 year old female with a past psychiatric history significant for depression who presents to [facility] via virtual telephone visit due to medication management in regards to depression.  Patient states that the reason for the visit today is for recommendations for an anti-depressant to help manage her symptoms. She explains that her emotions have been all over the place ever since she was diagnosed with breast cancer (Per Chart review 09/01/2020, Dx of malignant neoplasm  of 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, estrogen receptor positive). Patient reports having some good days as well as having some bad. She reports that she is usually good when people close to her are around her or when she stays busy. Patient reports that her depression seems to worsen when alone, especially at night. She reports that she made the mistake of looking up her diagnoses on [employer] and read about how some cancers can come back even after treatment. Patient is fearful of her cancer returning even after receiving treatment.  Patient reports that her depression is made worse due to having to deal with her cancer during the holidays. Since having her diagnosis and undergoing chemotherapy, patient hasn't been able to do some of her favorite activities during the holidays such as decorating and shopping. She reports that she hasn't been able to go to local gatherings (parades/communal lighting of Christmas Trees) due to her low WBC count/suppressed immune system. Patient feels like the only place she goes to is the cancer center.  Patient reports irritable mood most often related to people not being able to relate to the pain she goes through when receiving chemotherapy. She states that people often tell her that they understand what she is going through but she feels like they can never truly understand unless they have gone through what she specifically is going through. Patient also reports mood swings and anxiety. Patient states she is worried about the shape her body will be in after receiving chemotherapy and surgery.  While being treated for her breast cancer, patient states that she was placed on Celexa 10 mg daily. Patient states that she hasn't started yet because she wanted to know  from a psychiatrist if there was another antidepressant that would be more effective in the management of her depression symptoms. She reports that she has a past history of being placed on Celexa when  she was going through the passing of her mother. During that time, patient eventually ran out of her medication but was able to manage through the use of coping skills. Patient reports no past issues/adverse side-effects while being placed on Celexa.  Patient denies suicide and homicide ideations. She further denies auditory and visual hallucinations. Patient reports increased appetite which she attributes to needing to maintain a healthy weight while going through chemotherapy. She reports that she has been eating a lot better and has no concerns over the weight she has gained. Patient reports disturbed sleep. She denies current alcohol use. When she consumed alcohol, patient drank on special occasions such as her birthday, Christmas, and New Years. Patient no longer uses tobacco products stating that the chemotherapy made the habit taste disgusting. Patient denies illicit drug use.  Associated Signs/Symptoms: Depression Symptoms:  depressed mood, anhedonia, insomnia, fatigue, feelings of worthlessness/guilt, difficulty concentrating, anxiety, loss of energy/fatigue, disturbed sleep, weight gain, increased appetite, (Hypo) Manic Symptoms:  Elevated Mood, Flight of Ideas, Irritable Mood, Labiality of Mood, Anxiety Symptoms:  Excessive Worry, Obsessive Compulsive Symptoms:   Neatness, Social Anxiety, Psychotic Symptoms:  N/A PTSD Symptoms: Had a traumatic exposure:  Patient reports no traumatic events but has experienced losses Re-experiencing:  Patient reports she used to experience night terrors  Past Psychiatric History: Depression  Previous Psychotropic Medications: Yes   Substance Abuse History in the last 12 months:  No.  Consequences of Substance Abuse: NA  Past Medical History:  Past Medical History:  Diagnosis Date  . Anemia   . Depression   . Family history of breast cancer   . Family history of liver cancer   . Smoker   . Umbilical hernia     Past Surgical  History:  Procedure Laterality Date  . ABLATION ON ENDOMETRIOSIS    . GANGLION CYST EXCISION  2007   L wrist; [facility], [place]  . IR IMAGING GUIDED PORT INSERTION  09/10/2020  . TUBAL LIGATION     [facility]  . UMBILICAL HERNIA REPAIR  2005   [place]    Family Psychiatric History:  Biological Mother - Depression  Family History:  Family History  Problem Relation Age of Onset  . Diabetes Mother   . Hypertension Mother   . Liver cancer Daughter 90  . Diabetes Maternal Aunt   . Breast cancer Maternal Aunt 38  . Colon cancer Maternal Uncle 66       or prostate cancer  . Colon cancer Cousin   . Breast cancer Cousin   . Autism Half-Brother   . Breast cancer Other 68       maternal second cousin  . Diabetes Paternal Uncle   . Breast cancer Other        dx. 73s, paternal second cousin  . Lung cancer Other        dx. 28s, paternal cousin once removed (father's cousin)    Social History:   Social History   Socioeconomic History  . Marital status: Legally Separated    Spouse name: Not on file  . Number of children: 3  . Years of education: Not on file  . Highest education level: Associate degree: academic program  Occupational History  . Not on file  Tobacco Use  . Smoking status: Current Some Day Smoker  Packs/day: 1.00    Years: 25.00    Pack years: 25.00    Types: Cigarettes  . Smokeless tobacco: Never Used  . Tobacco comment: smoking cigarettes again and not e-cigs  Vaping Use  . Vaping Use: Never used  Substance and Sexual Activity  . Alcohol use: Yes    Alcohol/week: 1.0 standard drink    Types: 1 Glasses of wine per week  . Drug use: No  . Sexual activity: Yes    Birth control/protection: Post-menopausal  Other Topics Concern  . Not on file  Social History Narrative  . Not on file   Social Determinants of Health   Financial Resource Strain: High Risk  . Difficulty of Paying Living Expenses: Very hard  Food Insecurity: Food Insecurity Present  . Worried About  Charity fundraiser in the Last Year: Sometimes true  . Ran Out of Food in the Last Year: Never true  Transportation Needs: No Transportation Needs  . Lack of Transportation (Medical): No  . Lack of Transportation (Non-Medical): No  Physical Activity: Not on file  Stress: Not on file  Social Connections: Not on file    Additional Social History: Patient was adopted Patient has joined a breast cancer related support group. She states that being apart of this group gives her something to do as well as interact with individuals that are experiencing/have experienced what she is going through. Patient engaged and feels supported by her fiance  Allergies:   Allergies  Allergen Reactions  . Clarithromycin Swelling    Biaxin    Metabolic Disorder Labs: Lab Results  Component Value Date   HGBA1C 4.9 12/26/2018   MPG 93.93 12/26/2018   MPG 91 09/22/2016   No results found for: PROLACTIN Lab Results  Component Value Date   CHOL 116 12/26/2018   TRIG 13 12/26/2018   HDL 53 12/26/2018   CHOLHDL 2.2 12/26/2018   VLDL 3 12/26/2018   LDLCALC 60 12/26/2018   [place] 73 09/24/2017   Lab Results  Component Value Date   TSH 0.600 12/26/2018    Therapeutic Level Labs: No results found for: LITHIUM No results found for: CBMZ No results found for: VALPROATE  Current Medications: Current Outpatient Medications  Medication Sig Dispense Refill  . acetaminophen (TYLENOL) 500 MG tablet Take 500 mg by mouth every 6 (six) hours as needed for mild pain, fever or headache.    . Biotin 5000 MCG CAPS Take 5,000 mcg by mouth daily.     . Cholecalciferol (VITAMIN D3) 2000 units TABS Take 2,000 Units by mouth daily.     . citalopram (CELEXA) 10 MG tablet Take 1 tablet (10 mg total) by mouth daily. 90 tablet 3  . guaiFENesin (MUCINEX) 600 MG 12 hr tablet Take 600 mg by mouth 2 (two) times daily as needed for cough.    . lidocaine-prilocaine (EMLA) cream Apply to affected area once (Patient  taking differently: Apply 1 application topically as needed (port access). ) 30 g 3  . loperamide (IMODIUM) 2 MG capsule Take 1 capsule (2 mg total) by mouth as needed for diarrhea or loose stools. 30 capsule 0  . loratadine (CLARITIN) 10 MG tablet Take 1 tablet (10 mg total) by mouth daily.    Marland Kitchen LORazepam (ATIVAN) 0.5 MG tablet Take 1 tablet (0.5 mg total) by mouth at bedtime as needed (Nausea or vomiting). 30 tablet 0  . Multiple Vitamin (MULTIVITAMIN WITH MINERALS) TABS Take 1 tablet by mouth daily.    Marland Kitchen omeprazole (  PRILOSEC) 20 MG capsule Take 1 capsule (20 mg total) by mouth daily.    . ondansetron (ZOFRAN) 8 MG tablet Take 1 tablet (8 mg total) by mouth 2 (two) times daily as needed (Nausea or vomiting). Start on the third day after chemotherapy. 30 tablet 1  . prochlorperazine (COMPAZINE) 10 MG tablet Take 1 tablet (10 mg total) by mouth every 6 (six) hours as needed (Nausea or vomiting). 30 tablet 1  . TURMERIC PO Take 1 tablet by mouth daily.      No current facility-administered medications for this visit.    Musculoskeletal: Strength & Muscle Tone: Unable to assess due to telemedicine visit [place]: Unable to assess due to telemedicine visit Patient leans: Unable to assess due to telemedicine visit  Psychiatric Specialty Exam: Review of Systems  Psychiatric/Behavioral: Positive for decreased concentration and sleep disturbance. Negative for dysphoric mood, hallucinations, self-injury and suicidal ideas. The patient is nervous/anxious.     There were no vitals taken for this visit.There is no height or weight on file to calculate BMI.  General Appearance: Unable to assess due to telemedicine visit  Eye Contact:  Unable to assess due to telemedicine visit  Speech:  Clear and Coherent and Normal Rate  Volume:  Normal  Mood:  Anxious and Depressed  Affect:  Appropriate and Congruent  Thought Process:  Coherent, Goal Directed and Descriptions of Associations: Intact   Orientation:  Full (Time, Place, and Person)  Thought Content:  WDL and Logical  Suicidal Thoughts:  No  Homicidal Thoughts:  No  Memory:  Immediate;   Good Recent;   Good Remote;   Good  Judgement:  Good  Insight:  Good  Psychomotor Activity:  Normal  Concentration:  Concentration: Good and Attention Span: Good  Recall:  Good  Fund of Knowledge:Good  Language: Good  Akathisia:  NA  Handed:  Right  AIMS (if indicated):  not done  Assets:  Communication Skills Desire for Improvement Housing Social Support  ADL's:  Intact  Cognition: WNL  Sleep:  Fair   Screenings: Hotel manager from 09/01/2020 in [place] Visit from 03/04/2018 in [place] Visit from 09/24/2017 in [place] Visit from 09/22/2016 in [place]  PHQ-2 Total Score 3 1 0 2  PHQ-9 Total Score - - - 6      Assessment and Plan:  Amanda Rich is a 54 year old female with a past psychiatric history significant for depression who presents to [facility] via virtual telephone visit due to medication management in regards to depression. Patient was referred to [facility] by [place] and was placed on Celexa 10 mg daily. Patient has a past history of being placed on Celexa and reported no adverse side effect from using Celexa. Patient is agreeable to using Celexa for the management of her depression. Patient states that she has some refills of her current prescription of Celexa.  1. Episode of recurrent major depressive disorder, unspecified depression episode severity ([place]) - Patient was prescribed Celexa 10 mg for the management of her depression related to breast cancer diagnosis. - Patient has been on Celexa before in the past - Patient was recommended to start taking Celexa 10 mg daily - Patient was informed that she would follow-up with the  writer in 6 weeks to discuss if the medication was effectively managing her depressive symptoms. Patient was agreeable to plan.  Patient to follow-up in 6 weeks   Malachy Mood, PA 12/10/20211:43 PM

## 2020-12-06 ENCOUNTER — Other Ambulatory Visit: Payer: Medicaid Other

## 2020-12-06 ENCOUNTER — Encounter (HOSPITAL_COMMUNITY): Payer: Self-pay | Admitting: Physician Assistant

## 2020-12-06 ENCOUNTER — Ambulatory Visit: Payer: Medicaid Other

## 2020-12-06 ENCOUNTER — Encounter: Payer: Self-pay | Admitting: *Deleted

## 2020-12-06 ENCOUNTER — Ambulatory Visit: Payer: Medicaid Other | Admitting: Hematology and Oncology

## 2020-12-08 ENCOUNTER — Ambulatory Visit: Payer: Medicaid Other

## 2020-12-15 NOTE — Progress Notes (Signed)
[facility] OFFICE PROGRESS NOTE  Girtha Rm, NP-C [place] [address]  DIAGNOSIS:  Oncology History  Malignant neoplasm of 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, estrogen receptor positive ([place])  08/25/2020 Initial Diagnosis   Patient palpated a right breast lump x39yrand a left breast lump x282yrDiagnostic mammogram and USKoreahowed in the right breast, a 2.7cm mass 5cm from the nipple and 0.8cm mass 1cm from the nipple at the 12:30 position, with borderline cortical thickening in the right axilla, and in the left breast, a 4.0cm mass at the 11 o'clock position representing a hamartoma. 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   09/13/2020 -  Chemotherapy   The patient had dexamethasone (DECADRON) 4 MG tablet, 8 mg, Oral, Daily, 1 of 1 cycle, Start date: 09/01/2020, End date: 10/04/2020 palonosetron (ALOXI) injection 0.25 mg, 0.25 mg, Intravenous,  Once, 4 of 6 cycles Administration: 0.25 mg (09/13/2020), 0.25 mg (10/04/2020), 0.25 mg (10/25/2020), 0.25 mg (11/29/2020) pegfilgrastim-jmdb (FULPHILA) injection 6 mg, 6 mg, Subcutaneous,  Once, 1 of 1 cycle pegfilgrastim-cbqv (UDENYCA) injection 6 mg, 6 mg, Subcutaneous, Once, 4 of 6 cycles Administration: 6 mg (09/15/2020), 6 mg (10/06/2020), 6 mg (10/27/2020), 6 mg (12/01/2020) CARBOplatin (PARAPLATIN) 560 mg in sodium chloride 0.9 % 250 mL chemo infusion, 559.8 mg (100 % of original dose 559.8 mg), Intravenous,  Once, 4 of 6 cycles Dose modification:   (original dose 559.8 mg, Cycle 1) Administration: 560 mg (09/13/2020), 560 mg (10/04/2020), 560 mg (10/25/2020), 470 mg (11/29/2020) DOCEtaxel (TAXOTERE) 120 mg in sodium chloride 0.9 % 250 mL chemo infusion, 75 mg/m2 = 120 mg, Intravenous,  Once, 4 of 6 cycles Dose modification: 60 mg/m2 (original dose 75 mg/m2,  Cycle 5, Reason: Dose not tolerated) Administration: 120 mg (09/13/2020), 120 mg (10/04/2020), 120 mg (10/25/2020), 90 mg (11/29/2020) fosaprepitant (EMEND) 150 mg in sodium chloride 0.9 % 145 mL IVPB, 150 mg, Intravenous,  Once, 4 of 6 cycles Administration: 150 mg (09/13/2020), 150 mg (10/04/2020), 150 mg (10/25/2020), 150 mg (11/29/2020) pertuzumab (PERJETA) 420 mg in sodium chloride 0.9 % 250 mL chemo infusion, 420 mg (100 % of original dose 420 mg), Intravenous, Once, 4 of 6 cycles Dose modification: 420 mg (original dose 420 mg, Cycle 1, Reason: Provider Judgment) Administration: 420 mg (09/13/2020), 420 mg (10/04/2020), 420 mg (10/25/2020), 420 mg (11/29/2020) trastuzumab-anns (KANJINTI) 420 mg in sodium chloride 0.9 % 250 mL chemo infusion, 8 mg/kg = 420 mg, Intravenous,  Once, 4 of 6 cycles Administration: 420 mg (09/13/2020), 300 mg (10/04/2020), 300 mg (10/25/2020), 300 mg (11/29/2020)  for chemotherapy treatment.      CURRENT THERAPY: Scheduled for Cycle 5 TCH Perjeta with neulasta support IV every 3 weeks.   INTERVAL HISTORY: Amanda COTTAM89.o. female returns to the clinic today for a follow-up visit.  The patient is feeling fairly well today without any concerning complaints.  The patient tolerated the last cycle of her treatment well without any adverse side effects except for achiness following her neulasta injection. She takes claritin daily for this.  She was evaluated by member the nutritionist team during her last appointment due to the complaints of taste alterations, weight loss, and decreased appetite. She gained 5lbs compared to her last appointment.  The patient denies any recent fever, chills, or night sweats. The patient  states that she has has some hoarseness and a mild dry cough for about 1 month. She received magic mouthwash. She is concerned about it being related to her heartburn and endorses reflux. She takes prilosec 20 mg daily. She also states she experienced stress and  started smoking again which exacerbated her symptoms/cough. She is inquiring if she can have a prescription for nicoderm patches. She would like to quit smoking. The patient denies any nausea, vomiting, or constipation.  She is reporting occasional diarrhea which is controlled with Imodium.  The patient is here today for evaluation and repeat blood work before starting cycle #5 of her treatment.   MEDICAL HISTORY: Past Medical History:  Diagnosis Date  . Anemia   . Depression   . Family history of breast cancer   . Family history of liver cancer   . Smoker   . Umbilical hernia     ALLERGIES:  is allergic to clarithromycin.  MEDICATIONS:  Current Outpatient Medications  Medication Sig Dispense Refill  . acetaminophen (TYLENOL) 500 MG tablet Take 500 mg by mouth every 6 (six) hours as needed for mild pain, fever or headache.    . Biotin 5000 MCG CAPS Take 5,000 mcg by mouth daily.     . Cholecalciferol (VITAMIN D3) 2000 units TABS Take 2,000 Units by mouth daily.     . citalopram (CELEXA) 10 MG tablet Take 1 tablet (10 mg total) by mouth daily. 90 tablet 3  . guaiFENesin (MUCINEX) 600 MG 12 hr tablet Take 600 mg by mouth 2 (two) times daily as needed for cough.    . lidocaine-prilocaine (EMLA) cream Apply to affected area once (Patient taking differently: Apply 1 application topically as needed (port access). ) 30 g 3  . loperamide (IMODIUM) 2 MG capsule Take 1 capsule (2 mg total) by mouth as needed for diarrhea or loose stools. 30 capsule 0  . loratadine (CLARITIN) 10 MG tablet Take 1 tablet (10 mg total) by mouth daily.    Marland Kitchen LORazepam (ATIVAN) 0.5 MG tablet Take 1 tablet (0.5 mg total) by mouth at bedtime as needed (Nausea or vomiting). 30 tablet 0  . Multiple Vitamin (MULTIVITAMIN WITH MINERALS) TABS Take 1 tablet by mouth daily.    . nicotine (NICODERM CQ) 7 mg/24hr patch Place 1 patch (7 mg total) onto the skin daily. 28 patch 0  . omeprazole (PRILOSEC) 20 MG capsule Take 1 capsule  (20 mg total) by mouth daily.    . ondansetron (ZOFRAN) 8 MG tablet Take 1 tablet (8 mg total) by mouth 2 (two) times daily as needed (Nausea or vomiting). Start on the third day after chemotherapy. 30 tablet 1  . prochlorperazine (COMPAZINE) 10 MG tablet Take 1 tablet (10 mg total) by mouth every 6 (six) hours as needed (Nausea or vomiting). 30 tablet 1  . TURMERIC PO Take 1 tablet by mouth daily.      No current facility-administered medications for this visit.    SURGICAL HISTORY:  Past Surgical History:  Procedure Laterality Date  . ABLATION ON ENDOMETRIOSIS    . GANGLION CYST EXCISION  2007   L wrist; [facility], [place]  . IR IMAGING GUIDED PORT INSERTION  09/10/2020  . TUBAL LIGATION     [facility]  . UMBILICAL HERNIA REPAIR  2005   [place]    REVIEW OF SYSTEMS:   Review of Systems  Constitutional: Positive for fatigue. Negative for appetite change, chills, fever and unexpected weight change.  HENT: Positive for hoarseness. Negative for  mouth sores, nosebleeds, sore throat and trouble swallowing.   Eyes: Negative for eye problems and icterus.  Respiratory: Positive for mild dry cough. Negative for hemoptysis, shortness of breath and wheezing.   Cardiovascular: Negative for chest pain and leg swelling.  Gastrointestinal: Positive for controlled diarrhea. Negative for abdominal pain, constipation, diarrhea, nausea and vomiting.  Genitourinary: Negative for bladder incontinence, difficulty urinating, dysuria, frequency and hematuria.   Musculoskeletal: Positive for arthralgias/myalgias. Negative for back pain, gait problem, neck pain and neck stiffness.  Skin: Negative for itching and rash.  Neurological: Negative for dizziness, extremity weakness, gait problem, headaches, light-headedness and seizures.  Hematological: Negative for adenopathy. Does not bruise/bleed easily.  Psychiatric/Behavioral: Negative for confusion, depression and sleep disturbance. The patient is not nervous/anxious.      PHYSICAL EXAMINATION:  Blood pressure 114/72, pulse 93, temperature 97.8 F (36.6 C), temperature source Tympanic, resp. rate 16, height 5' 5"  (1.651 m), weight 127 lb 14.4 oz (58 kg), SpO2 100 %.  ECOG PERFORMANCE STATUS: 1 - Symptomatic but completely ambulatory  Physical Exam  Constitutional: Oriented to person, place, and time and well-developed, well-nourished, and in no distress. HENT:  Head: Normocephalic and atraumatic.  Mouth/Throat: Oropharynx is clear and moist. No oropharyngeal exudate.  Eyes: Conjunctivae are normal. Right eye exhibits no discharge. Left eye exhibits no discharge. No scleral icterus.  Neck: Normal range of motion. Neck supple.  Cardiovascular: Normal rate, regular rhythm, normal heart sounds and intact distal pulses.   Pulmonary/Chest: Effort normal and breath sounds normal. No respiratory distress. No wheezes. No rales.  Abdominal: Soft. Bowel sounds are normal. Exhibits no distension and no mass. There is no tenderness.  Musculoskeletal: Normal range of motion. Exhibits no edema.  Lymphadenopathy:    No cervical adenopathy.  Neurological: Alert and oriented to person, place, and time. Exhibits normal muscle tone. Gait normal. Coordination normal.  Skin: Skin is warm and dry. No rash noted. Not diaphoretic. No erythema. No pallor.  Psychiatric: Mood, memory and judgment normal.  Vitals reviewed.  LABORATORY DATA: Lab Results  Component Value Date   WBC 9.1 12/20/2020   HGB 10.4 (L) 12/20/2020   HCT 31.4 (L) 12/20/2020   MCV 89.2 12/20/2020   PLT 206 12/20/2020      Chemistry      Component Value Date/Time   NA 140 11/29/2020 0810   K 3.4 (L) 11/29/2020 0810   CL 108 11/29/2020 0810   CO2 26 11/29/2020 0810   BUN 13 11/29/2020 0810   CREATININE 0.67 11/29/2020 0810   CREATININE 0.68 09/24/2017 0827      Component Value Date/Time   CALCIUM 8.7 (L) 11/29/2020 0810   ALKPHOS 67 11/29/2020 0810   AST 18 11/29/2020 0810   ALT 16  11/29/2020 0810   BILITOT 0.4 11/29/2020 0810       RADIOGRAPHIC STUDIES:  No results found.   ASSESSMENT/PLAN:  Malignant neoplasm of 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, estrogen receptor positive ([place]) Patient palpated a right breast lump x79yrand a left breast lump x229yrDiagnostic mammogram and USKoreahowed in the right breast, a 2.7cm mass 5cm from the nipple and 0.8cm mass 1cm from the nipple at the 12:30 position, with borderline cortical thickening in the right axilla, and in the left breast, a 4.0cm mass at the 11 o'clock position representing a hamartoma. Right breast biopsy showed IDC at the 12:30 position, HER-2 equivocal by IHC, positive by FISH, ER+ 30% weak, PR- 0%, Ki67 50%, and benign findings 1cm from  the nipple and in the axilla.  Treatment plan: 1. Neoadjuvant chemotherapy with TCH Perjeta 6 cycles followed by Herceptin maintenance for 1 year 2. Followed by breast conserving surgery if possible with sentinel lymph node study 3. Followed by adjuvant radiation therapy if patient had lumpectomy 4.Followed by adjuvant antiestrogen therapy ------------------------------------------------------------------------------------------------------------------------------------------- Current treatment: Cycle 5TCH Perjeta Echocardiogram 09/06/2020: EF 60 to 65%  Chemo toxicities: 1.Diarrhea:She now knows how to manage diarrhea with Imodium. 2.nausea and vomiting:Improved after taking Prilosec for heartburn. 3.Fatigue: Stable 4.Lack of taste and appetite: Previously evaluated by a member of the nutritionist team. Gained 5 lbs since last appointment. 5.Elevated LFTs:  Improved 6.Chemo induced anemia: Monitoring. Stable 7.Elevated blood sugars: Dr. Lindi Adie previously discontinued dexamethasone pills.  8. Neutropenia: Improved today. Receives neulasta.  9. Tobacco use: Patient requesting nicoderm patches for smoking cessation. Prescription sent to [facility].  10. GERD: Patient education given on AVS. Advised to try using protonix or increase prilosec to 40 mg daily for a few days until control of her symptoms then reduce back to 20 mg daily.   Monitoring closely for toxicities. Return to clinic in3 weeks for cycle 6   No orders of the defined types were placed in this encounter.    Aizlynn Digilio L Astra Gregg, PA-C 12/20/20

## 2020-12-20 ENCOUNTER — Encounter: Payer: Self-pay | Admitting: Physician Assistant

## 2020-12-20 ENCOUNTER — Other Ambulatory Visit: Payer: Self-pay | Admitting: Physician Assistant

## 2020-12-20 ENCOUNTER — Inpatient Hospital Stay: Payer: Medicaid Other

## 2020-12-20 ENCOUNTER — Encounter: Payer: Self-pay | Admitting: *Deleted

## 2020-12-20 ENCOUNTER — Encounter: Payer: Self-pay | Admitting: Licensed Clinical Social Worker

## 2020-12-20 ENCOUNTER — Other Ambulatory Visit: Payer: Self-pay

## 2020-12-20 ENCOUNTER — Inpatient Hospital Stay (HOSPITAL_BASED_OUTPATIENT_CLINIC_OR_DEPARTMENT_OTHER): Payer: Medicaid Other | Admitting: Physician Assistant

## 2020-12-20 VITALS — BP 114/72 | HR 93 | Temp 97.8°F | Resp 16 | Ht 65.0 in | Wt 127.9 lb

## 2020-12-20 DIAGNOSIS — C50211 Malignant neoplasm of upper-inner quadrant of right female breast: Secondary | ICD-10-CM

## 2020-12-20 DIAGNOSIS — Z79899 Other long term (current) drug therapy: Secondary | ICD-10-CM | POA: Diagnosis not present

## 2020-12-20 DIAGNOSIS — Z170 Estrogen receptor positive status [ER+]: Secondary | ICD-10-CM

## 2020-12-20 DIAGNOSIS — F17200 Nicotine dependence, unspecified, uncomplicated: Secondary | ICD-10-CM | POA: Diagnosis not present

## 2020-12-20 DIAGNOSIS — D6481 Anemia due to antineoplastic chemotherapy: Secondary | ICD-10-CM | POA: Diagnosis not present

## 2020-12-20 DIAGNOSIS — Z5112 Encounter for antineoplastic immunotherapy: Secondary | ICD-10-CM | POA: Diagnosis not present

## 2020-12-20 DIAGNOSIS — Z5111 Encounter for antineoplastic chemotherapy: Secondary | ICD-10-CM | POA: Diagnosis not present

## 2020-12-20 LAB — CBC WITH DIFFERENTIAL (CANCER CENTER ONLY)
Abs Immature Granulocytes: 0.02 10*3/uL (ref 0.00–0.07)
Basophils Absolute: 0 10*3/uL (ref 0.0–0.1)
Basophils Relative: 0 %
Eosinophils Absolute: 0 10*3/uL (ref 0.0–0.5)
Eosinophils Relative: 0 %
HCT: 31.4 % — ABNORMAL LOW (ref 36.0–46.0)
Hemoglobin: 10.4 g/dL — ABNORMAL LOW (ref 12.0–15.0)
Immature Granulocytes: 0 %
Lymphocytes Relative: 18 %
Lymphs Abs: 1.6 10*3/uL (ref 0.7–4.0)
MCH: 29.5 pg (ref 26.0–34.0)
MCHC: 33.1 g/dL (ref 30.0–36.0)
MCV: 89.2 fL (ref 80.0–100.0)
Monocytes Absolute: 1.2 10*3/uL — ABNORMAL HIGH (ref 0.1–1.0)
Monocytes Relative: 13 %
Neutro Abs: 6.2 10*3/uL (ref 1.7–7.7)
Neutrophils Relative %: 69 %
Platelet Count: 206 10*3/uL (ref 150–400)
RBC: 3.52 MIL/uL — ABNORMAL LOW (ref 3.87–5.11)
RDW: 14.2 % (ref 11.5–15.5)
WBC Count: 9.1 10*3/uL (ref 4.0–10.5)
nRBC: 0 % (ref 0.0–0.2)

## 2020-12-20 LAB — CMP (CANCER CENTER ONLY)
ALT: 33 U/L (ref 0–44)
AST: 32 U/L (ref 15–41)
Albumin: 3.9 g/dL (ref 3.5–5.0)
Alkaline Phosphatase: 65 U/L (ref 38–126)
Anion gap: 8 (ref 5–15)
BUN: 8 mg/dL (ref 6–20)
CO2: 25 mmol/L (ref 22–32)
Calcium: 9 mg/dL (ref 8.9–10.3)
Chloride: 106 mmol/L (ref 98–111)
Creatinine: 0.56 mg/dL (ref 0.44–1.00)
GFR, Estimated: >60 mL/min (ref >60)
Glucose, Bld: 100 mg/dL — ABNORMAL HIGH (ref 70–99)
Potassium: 3.8 mmol/L (ref 3.5–5.1)
Sodium: 139 mmol/L (ref 135–145)
Total Bilirubin: 0.2 mg/dL — ABNORMAL LOW (ref 0.3–1.2)
Total Protein: 6.7 g/dL (ref 6.5–8.1)

## 2020-12-20 MED ORDER — SODIUM CHLORIDE 0.9% FLUSH
10.0000 mL | INTRAVENOUS | Status: DC | PRN
  Administered 2020-12-20: 10 mL
  Filled 2020-12-20: qty 10

## 2020-12-20 MED ORDER — SODIUM CHLORIDE 0.9 % IV SOLN
420.0000 mg | Freq: Once | INTRAVENOUS | Status: AC
  Administered 2020-12-20: 420 mg via INTRAVENOUS
  Filled 2020-12-20: qty 14

## 2020-12-20 MED ORDER — SODIUM CHLORIDE 0.9% FLUSH
10.0000 mL | Freq: Once | INTRAVENOUS | Status: AC
  Administered 2020-12-20: 10 mL
  Filled 2020-12-20: qty 10

## 2020-12-20 MED ORDER — SODIUM CHLORIDE 0.9 % IV SOLN
466.5000 mg | Freq: Once | INTRAVENOUS | Status: AC
  Administered 2020-12-20: 470 mg via INTRAVENOUS
  Filled 2020-12-20: qty 47

## 2020-12-20 MED ORDER — DIPHENHYDRAMINE HCL 25 MG PO CAPS
ORAL_CAPSULE | ORAL | Status: AC
  Filled 2020-12-20: qty 2

## 2020-12-20 MED ORDER — SODIUM CHLORIDE 0.9 % IV SOLN
150.0000 mg | Freq: Once | INTRAVENOUS | Status: AC
  Administered 2020-12-20: 150 mg via INTRAVENOUS
  Filled 2020-12-20: qty 150

## 2020-12-20 MED ORDER — PALONOSETRON HCL INJECTION 0.25 MG/5ML
INTRAVENOUS | Status: AC
  Filled 2020-12-20: qty 5

## 2020-12-20 MED ORDER — PALONOSETRON HCL INJECTION 0.25 MG/5ML
0.2500 mg | Freq: Once | INTRAVENOUS | Status: AC
  Administered 2020-12-20: 0.25 mg via INTRAVENOUS

## 2020-12-20 MED ORDER — SODIUM CHLORIDE 0.9 % IV SOLN
Freq: Once | INTRAVENOUS | Status: AC
  Filled 2020-12-20: qty 250

## 2020-12-20 MED ORDER — ACETAMINOPHEN 325 MG PO TABS
650.0000 mg | ORAL_TABLET | Freq: Once | ORAL | Status: AC
  Administered 2020-12-20: 650 mg via ORAL

## 2020-12-20 MED ORDER — DIPHENHYDRAMINE HCL 25 MG PO CAPS
50.0000 mg | ORAL_CAPSULE | Freq: Once | ORAL | Status: AC
  Administered 2020-12-20: 50 mg via ORAL

## 2020-12-20 MED ORDER — NICOTINE 7 MG/24HR TD PT24: 7.0000 mg | MEDICATED_PATCH | Freq: Every day | TRANSDERMAL | 0 refills | Status: DC

## 2020-12-20 MED ORDER — SODIUM CHLORIDE 0.9 % IV SOLN
10.0000 mg | Freq: Once | INTRAVENOUS | Status: AC
  Administered 2020-12-20: 10 mg via INTRAVENOUS
  Filled 2020-12-20: qty 10

## 2020-12-20 MED ORDER — TRASTUZUMAB-ANNS CHEMO 150 MG IV SOLR
6.0000 mg/kg | Freq: Once | INTRAVENOUS | Status: AC
  Administered 2020-12-20: 315 mg via INTRAVENOUS
  Filled 2020-12-20: qty 15

## 2020-12-20 MED ORDER — SODIUM CHLORIDE 0.9 % IV SOLN
60.0000 mg/m2 | Freq: Once | INTRAVENOUS | Status: AC
  Administered 2020-12-20: 90 mg via INTRAVENOUS
  Filled 2020-12-20: qty 9

## 2020-12-20 MED ORDER — HEPARIN SOD (PORK) LOCK FLUSH 100 UNIT/ML IV SOLN
500.0000 [IU] | Freq: Once | INTRAVENOUS | Status: AC | PRN
  Administered 2020-12-20: 500 [IU]
  Filled 2020-12-20: qty 5

## 2020-12-20 MED ORDER — ACETAMINOPHEN 325 MG PO TABS
ORAL_TABLET | ORAL | Status: AC
  Filled 2020-12-20: qty 2

## 2020-12-20 NOTE — Progress Notes (Signed)
Patient saw Cassie today before infusion, but did not report her worsening neuropathy in the feet until well into infusion appointment.  Cassie was notified and gave OK to treat for taxotere today.  Patient was instructed to report neuropathy changes at her next visit with Dr. Pamelia Hoit.  Patient verbalized understanding.

## 2020-12-20 NOTE — Patient Instructions (Signed)
[facility] Discharge Instructions for Patients Receiving Chemotherapy  Today you received the following chemotherapy agents trastuzumab, pertuzumab, docetaxel, carboplatin.  To help prevent nausea and vomiting after your treatment, we encourage you to take your nausea medication as directed.    If you develop nausea and vomiting that is not controlled by your nausea medication, call the clinic.   BELOW ARE SYMPTOMS THAT SHOULD BE REPORTED IMMEDIATELY:  *FEVER GREATER THAN 100.5 F  *CHILLS WITH OR WITHOUT FEVER  NAUSEA AND VOMITING THAT IS NOT CONTROLLED WITH YOUR NAUSEA MEDICATION  *UNUSUAL SHORTNESS OF BREATH  *UNUSUAL BRUISING OR BLEEDING  TENDERNESS IN MOUTH AND THROAT WITH OR WITHOUT PRESENCE OF ULCERS  *URINARY PROBLEMS  *BOWEL PROBLEMS  UNUSUAL RASH Items with * indicate a potential emergency and should be followed up as soon as possible.  Feel free to call the clinic should you have any questions or concerns. The clinic phone number is [phone].  Please show the CHEMO ALERT CARD at check-in to the Emergency Department and triage nurse.   

## 2020-12-20 NOTE — Patient Instructions (Signed)

## 2020-12-20 NOTE — Progress Notes (Signed)
[facility] CSW Progress Note  Holiday representative met with patient to provide ongoing support. Patient reports doing fairly well currently. She is engaged with her support group and they did a Christmas card and recipe exchange and have a Zoom New Years planned. It has been helpful for her to talk to people who have been through the same thing as her so she can feel more prepared and have feelings normalized. She is taking each step piece by piece.  Pt is in process with her disability application and is working on getting final supporting documents for [place] for mortgage assistance. CSW provided final [employer] fund cards today.   CSW will check-in periodically through treatment. Patient may call as needed for resource or coping support.   Christeen Douglas , LCSW

## 2020-12-21 ENCOUNTER — Telehealth: Payer: Self-pay | Admitting: Adult Health

## 2020-12-21 NOTE — Telephone Encounter (Signed)
Scheduled appts per 12/27 los. Pt to get updated appt calendar at next visit per appt notes.

## 2020-12-22 ENCOUNTER — Inpatient Hospital Stay: Payer: Medicaid Other

## 2020-12-22 ENCOUNTER — Other Ambulatory Visit: Payer: Self-pay | Admitting: *Deleted

## 2020-12-22 ENCOUNTER — Other Ambulatory Visit: Payer: Self-pay

## 2020-12-22 ENCOUNTER — Telehealth: Payer: Self-pay | Admitting: *Deleted

## 2020-12-22 VITALS — BP 105/69 | HR 69 | Temp 98.1°F | Resp 20

## 2020-12-22 DIAGNOSIS — Z170 Estrogen receptor positive status [ER+]: Secondary | ICD-10-CM

## 2020-12-22 DIAGNOSIS — Z5111 Encounter for antineoplastic chemotherapy: Secondary | ICD-10-CM | POA: Diagnosis not present

## 2020-12-22 DIAGNOSIS — C50211 Malignant neoplasm of upper-inner quadrant of right female breast: Secondary | ICD-10-CM

## 2020-12-22 DIAGNOSIS — Z5112 Encounter for antineoplastic immunotherapy: Secondary | ICD-10-CM | POA: Diagnosis not present

## 2020-12-22 DIAGNOSIS — D6481 Anemia due to antineoplastic chemotherapy: Secondary | ICD-10-CM | POA: Diagnosis not present

## 2020-12-22 DIAGNOSIS — Z79899 Other long term (current) drug therapy: Secondary | ICD-10-CM | POA: Diagnosis not present

## 2020-12-22 MED ORDER — PEGFILGRASTIM-CBQV 6 MG/0.6ML ~~LOC~~ SOSY
6.0000 mg | PREFILLED_SYRINGE | Freq: Once | SUBCUTANEOUS | Status: AC
  Administered 2020-12-22: 6 mg via SUBCUTANEOUS

## 2020-12-22 MED ORDER — PEGFILGRASTIM-CBQV 6 MG/0.6ML ~~LOC~~ SOSY
PREFILLED_SYRINGE | SUBCUTANEOUS | Status: AC
  Filled 2020-12-22: qty 0.6

## 2020-12-22 NOTE — Progress Notes (Signed)
Pt requesting shower chair at home to safely bathe on days she receives chemotherapy.  Pt reports increase in fatigue following chemotherapy.  [facility] notified.  RN received [facility] Prior Authorization request for shower chair.  Request successfully filled out and faxed with recent office notes and shower chair prescription to [phone].

## 2020-12-22 NOTE — Patient Instructions (Signed)

## 2020-12-22 NOTE — Telephone Encounter (Signed)
Received call from pt stating [facility] is coming out to the home 2 days a week to assist with ADL's.  Pt states they are requesting pt to receive a shower chair.  RN attempt x1 to contact [facility] [phone]) to verify the best way to order shower chair for pt.  No answer, LVM with direct number to office.

## 2020-12-25 DIAGNOSIS — Z419 Encounter for procedure for purposes other than remedying health state, unspecified: Secondary | ICD-10-CM | POA: Diagnosis not present

## 2020-12-27 ENCOUNTER — Other Ambulatory Visit: Payer: Medicaid Other

## 2020-12-27 ENCOUNTER — Ambulatory Visit: Payer: Medicaid Other

## 2020-12-27 ENCOUNTER — Ambulatory Visit: Payer: Medicaid Other | Admitting: Hematology and Oncology

## 2020-12-29 ENCOUNTER — Ambulatory Visit: Payer: Medicaid Other

## 2021-01-03 ENCOUNTER — Other Ambulatory Visit: Payer: Self-pay | Admitting: *Deleted

## 2021-01-03 ENCOUNTER — Encounter: Payer: Self-pay | Admitting: *Deleted

## 2021-01-03 DIAGNOSIS — Z170 Estrogen receptor positive status [ER+]: Secondary | ICD-10-CM

## 2021-01-03 DIAGNOSIS — C50211 Malignant neoplasm of upper-inner quadrant of right female breast: Secondary | ICD-10-CM

## 2021-01-03 NOTE — Progress Notes (Signed)
Received call from [facility] with [employer] stating shower chairs are currently on back order but they do have a 3 in 1 that is available and works as a Civil engineer, contracting.  Per MD okay to order 3 in 1.  Orders sucessfully placed.

## 2021-01-03 NOTE — Progress Notes (Signed)
Received PA approval from [facility] for DME shower chair. RN successfully faxed [phone])  PA approval to [facility] with [employer] and placed shower chair order per MD request in epic.

## 2021-01-04 ENCOUNTER — Encounter (HOSPITAL_COMMUNITY): Payer: Medicaid Other | Admitting: Physician Assistant

## 2021-01-05 ENCOUNTER — Encounter: Payer: Self-pay | Admitting: *Deleted

## 2021-01-07 ENCOUNTER — Telehealth: Payer: Self-pay | Admitting: Hematology and Oncology

## 2021-01-07 NOTE — Assessment & Plan Note (Deleted)
Patient palpated a right breast lump x40yrand a left breast lump x222yrDiagnostic mammogram and USKoreahowed in the right breast, a 2.7cm mass 5cm from the nipple and 0.8cm mass 1cm from the nipple at the 12:30 position, with borderline cortical thickening in the right axilla, and in the left breast, a 4.0cm mass at the 11 o'clock position representing a hamartoma. 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  Treatment plan: 1. Neoadjuvant chemotherapy with TCH Perjeta 6 cycles followed by Herceptin maintenance for 1 year 2. Followed by breast conserving surgery if possible with sentinel lymph node study 3. Followed by adjuvant radiation therapy if patient had lumpectomy 4.Followed by adjuvant antiestrogen therapy ------------------------------------------------------------------------------------------------------------------------------------------- Current treatment: Cycle 4TCH Perjeta Echocardiogram 09/06/2020: EF 60 to 65%  Chemo toxicities: 1.Diarrhea:She now knows how to manage diarrhea with Imodium. 2.nausea and vomiting:Improved after taking Prilosec for heartburn. 3.Fatigue: Continues to be problem 4.Lack of taste and appetite: She is very sad that for Thanksgiving she may not have good taste. 5.Elevated LFTs:   Slightly better than before.  Okay to treat. 6.Chemo induced anemia: Monitoring 7.Elevated blood sugars: I discontinued dexamethasone pills.  I strongly discouraged her from eating any sugary foods.  She is eating a lot of cakes and pies  Monitoring closely for toxicities. Return to clinic in3 weeks for cycle 5

## 2021-01-07 NOTE — Telephone Encounter (Signed)
Scheduled appt per 1/14 sch message to r/s due to inclement weather . Called pt , no answer. Left message for patient with appt date and time

## 2021-01-09 NOTE — Progress Notes (Incomplete)
Patient Care Team: Girtha Rm, NP-C as PCP - General (Family Medicine) Sueanne Margarita, MD as PCP - Cardiology (Cardiology) Rockwell Germany, RN as Oncology Nurse Navigator [name], RN as Oncology Nurse Navigator [name], MD as Consulting Physician (General Surgery) Amanda Lose, MD as Consulting Physician (Hematology and Oncology) Gery Pray, MD as Consulting Physician (Radiation Oncology)  DIAGNOSIS: No diagnosis found.  SUMMARY OF ONCOLOGIC HISTORY: Oncology History  Malignant neoplasm of 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, estrogen receptor positive ([name])  08/25/2020 Initial Diagnosis   Patient palpated a right breast lump x43yrand a left breast lump x234yrDiagnostic mammogram and US[place]oreahowed in the right breast, a 2.7cm mass 5cm from the nipple and 0.8cm mass 1cm from the nipple at the 12:30 position, with borderline cortical thickening in the right axilla, and in the left breast, a 4.0cm mass at the 11 o'clock position representing a hamartoma. 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   09/13/2020 -  Chemotherapy    Patient is on Treatment Plan: BREAST  DOCETAXEL + CARBOPLATIN + TRASTUZUMAB + PERTUZUMAB  (TCHP) Q21D         CHIEF COMPLIANT: Cycle 6 TCH Perjeta  INTERVAL HISTORY: Amanda PACINIs a [age].o. with above-mentioned history of right breast cancer was currently on neoadjuvant chemotherapy and today is cycle 6 of [place]  ALLERGIES:  is allergic to clarithromycin.  MEDICATIONS:  Current Outpatient Medications  Medication Sig Dispense Refill  . acetaminophen (TYLENOL) 500 MG tablet Take 500 mg by mouth every 6 (six) hours as needed for mild pain, fever or headache.    . Biotin 5000 MCG CAPS Take 5,000 mcg by mouth daily.     . Cholecalciferol (VITAMIN D3) 2000 units TABS Take  2,000 Units by mouth daily.     . citalopram (CELEXA) 10 MG tablet Take 1 tablet (10 mg total) by mouth daily. 90 tablet 3  . guaiFENesin (MUCINEX) 600 MG 12 hr tablet Take 600 mg by mouth 2 (two) times daily as needed for cough.    . lidocaine-prilocaine (EMLA) cream Apply to affected area once (Patient taking differently: Apply 1 application topically as needed (port access). ) 30 g 3  . loperamide (IMODIUM) 2 MG capsule Take 1 capsule (2 mg total) by mouth as needed for diarrhea or loose stools. 30 capsule 0  . loratadine (CLARITIN) 10 MG tablet Take 1 tablet (10 mg total) by mouth daily.    . Marland KitchenORazepam (ATIVAN) 0.5 MG tablet Take 1 tablet (0.5 mg total) by mouth at bedtime as needed (Nausea or vomiting). 30 tablet 0  . Multiple Vitamin (MULTIVITAMIN WITH MINERALS) TABS Take 1 tablet by mouth daily.    . nicotine (NICODERM CQ) 7 mg/24hr patch Place 1 patch (7 mg total) onto the skin daily. 28 patch 0  . omeprazole (PRILOSEC) 20 MG capsule Take 1 capsule (20 mg total) by mouth daily.    . ondansetron (ZOFRAN) 8 MG tablet Take 1 tablet (8 mg total) by mouth 2 (two) times daily as needed (Nausea or vomiting). Start on the third day after chemotherapy. 30 tablet 1  . prochlorperazine (COMPAZINE) 10 MG tablet Take 1 tablet (10 mg total) by mouth every 6 (six) hours as needed (Nausea or vomiting). 30 tablet 1  . TURMERIC PO Take  1 tablet by mouth daily.      No current facility-administered medications for this visit.    PHYSICAL EXAMINATION: ECOG PERFORMANCE STATUS: {CHL ONC ECOG [phone]}  There were no vitals filed for this visit. There were no vitals filed for this visit.  LABORATORY DATA:  I have reviewed the data as listed CMP Latest Ref Rng & Units 12/20/2020 11/29/2020 10/30/2020  Glucose 70 - 99 mg/dL 100(H) 85 106(H)  BUN 6 - 20 mg/dL _0 Creatinine 0.44 - 1.00 mg/dL 0.56 0.67 0.60  Sodium 135 - 145 mmol/L 139 140 137  Potassium 3.5 - 5.1 mmol/L 3.8 3.4(L) 4.1  Chloride  98 - 111 mmol/L 106 108 101  CO2 22 - 32 mmol/L _1 Calcium 8.9 - 10.3 mg/dL 9.0 8.7(L) 9.1  Total Protein 6.5 - 8.1 g/dL 6.7 6.3(L) 7.3  Total Bilirubin 0.3 - 1.2 mg/dL 0.2(L) 0.4 0.9  Alkaline Phos 38 - 126 U/L 65 67 91  AST 15 - 41 U/L 32 18 50(H)  ALT 0 - 44 U/L 33 16 166(H)    Lab Results  Component Value Date   WBC 9.1 12/20/2020   HGB 10.4 (L) 12/20/2020   HCT 31.4 (L) 12/20/2020   MCV 89.2 12/20/2020   PLT 206 12/20/2020   NEUTROABS 6.2 12/20/2020    ASSESSMENT & PLAN:  No problem-specific Assessment & Plan notes found for this encounter.    No orders of the defined types were placed in this encounter.  The patient has a good understanding of the overall plan. she agrees with it. she will call with any problems that may develop before the next visit here.  Total time spent: *** mins including face to face time and time spent for planning, charting and coordination of care  Amanda Lose, MD 01/09/2021  I, Amanda Rich, am acting as scribe for Dr. Nicholas Rich.  {insert scribe attestation}

## 2021-01-10 ENCOUNTER — Ambulatory Visit: Payer: Medicaid Other

## 2021-01-10 ENCOUNTER — Ambulatory Visit: Payer: Medicaid Other | Admitting: Hematology and Oncology

## 2021-01-10 ENCOUNTER — Other Ambulatory Visit: Payer: Medicaid Other

## 2021-01-10 NOTE — Assessment & Plan Note (Deleted)
Patient palpated a right breast lump x82yrand a left breast lump x273yrDiagnostic mammogram and USKoreahowed in the right breast, a 2.7cm mass 5cm from the nipple and 0.8cm mass 1cm from the nipple at the 12:30 position, with borderline cortical thickening in the right axilla, and in the left breast, a 4.0cm mass at the 11 o'clock position representing a hamartoma. 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  Treatment plan: 1. Neoadjuvant chemotherapy with TCH Perjeta 6 cycles followed by Herceptin maintenance for 1 year 2. Followed by breast conserving surgery if possible with sentinel lymph node study 3. Followed by adjuvant radiation therapy if patient had lumpectomy 4.Followed by adjuvant antiestrogen therapy ------------------------------------------------------------------------------------------------------------------------------------------- Current treatment: Cycle 6TCH Perjeta Echocardiogram 09/06/2020: EF 60 to 65%  Chemo toxicities: 1.Diarrhea:She now knows how to manage diarrhea with Imodium. 2.nausea and vomiting:Improved after taking Prilosec for heartburn. 3.Fatigue: Continues to be problem 4.Lack of taste and appetite: She is very sad that for Thanksgiving she may not have good taste. 5.Elevated LFTs:   Slightly better than before.  Okay to treat. 6.Chemo induced anemia: Monitoring 7.Elevated blood sugars: I discontinued dexamethasone pills.  I strongly discouraged her from eating any sugary foods.  She is eating a lot of cakes and pies  Monitoring closely for toxicities. Return to clinic in3 weeks for Herceptin and Perjeta. Breast MRI scheduled for 01/11/2021 She already has appointments to see her surgeon.  I will see her back after surgery to discuss further adjuvant plan between Herceptin Perjeta versus  Kadcyla..Marland Kitchen

## 2021-01-11 ENCOUNTER — Other Ambulatory Visit: Payer: Medicaid Other

## 2021-01-11 ENCOUNTER — Encounter: Payer: Self-pay | Admitting: *Deleted

## 2021-01-12 ENCOUNTER — Ambulatory Visit: Payer: Medicaid Other

## 2021-01-13 ENCOUNTER — Telehealth: Payer: Self-pay | Admitting: Hematology and Oncology

## 2021-01-13 ENCOUNTER — Other Ambulatory Visit: Payer: Self-pay

## 2021-01-13 ENCOUNTER — Ambulatory Visit
Admission: RE | Admit: 2021-01-13 | Discharge: 2021-01-13 | Disposition: A | Discharge status: Home / Self Care | Payer: Medicaid Other | Source: Ambulatory Visit | Attending: Hematology and Oncology | Admitting: Hematology and Oncology

## 2021-01-13 DIAGNOSIS — C50211 Malignant neoplasm of upper-inner quadrant of right female breast: Secondary | ICD-10-CM

## 2021-01-13 DIAGNOSIS — D0511 Intraductal carcinoma in situ of right breast: Secondary | ICD-10-CM | POA: Diagnosis not present

## 2021-01-13 DIAGNOSIS — Z170 Estrogen receptor positive status [ER+]: Secondary | ICD-10-CM

## 2021-01-13 MED ORDER — GADOBUTROL 1 MMOL/ML IV SOLN
6.0000 mL | Freq: Once | INTRAVENOUS | Status: AC | PRN
  Administered 2021-01-13: 6 mL via INTRAVENOUS

## 2021-01-13 NOTE — Telephone Encounter (Signed)
Scheduled appt per 1/17 sch msg - pt is aware of new appt.

## 2021-01-14 ENCOUNTER — Inpatient Hospital Stay (HOSPITAL_BASED_OUTPATIENT_CLINIC_OR_DEPARTMENT_OTHER): Payer: Medicaid Other | Admitting: Adult Health

## 2021-01-14 ENCOUNTER — Other Ambulatory Visit: Payer: Self-pay

## 2021-01-14 ENCOUNTER — Encounter: Payer: Self-pay | Admitting: Adult Health

## 2021-01-14 ENCOUNTER — Ambulatory Visit (HOSPITAL_COMMUNITY)
Admission: RE | Admit: 2021-01-14 | Discharge: 2021-01-14 | Disposition: A | Discharge status: Home / Self Care | Payer: Medicaid Other | Source: Ambulatory Visit | Attending: Adult Health | Admitting: Adult Health

## 2021-01-14 ENCOUNTER — Inpatient Hospital Stay: Payer: Medicaid Other | Attending: Hematology and Oncology

## 2021-01-14 ENCOUNTER — Inpatient Hospital Stay: Payer: Medicaid Other

## 2021-01-14 VITALS — BP 106/55 | HR 79 | Temp 97.7°F | Resp 18 | Ht 65.0 in | Wt 126.5 lb

## 2021-01-14 DIAGNOSIS — R112 Nausea with vomiting, unspecified: Secondary | ICD-10-CM | POA: Insufficient documentation

## 2021-01-14 DIAGNOSIS — C50211 Malignant neoplasm of upper-inner quadrant of right female breast: Secondary | ICD-10-CM | POA: Insufficient documentation

## 2021-01-14 DIAGNOSIS — Z170 Estrogen receptor positive status [ER+]: Secondary | ICD-10-CM | POA: Insufficient documentation

## 2021-01-14 DIAGNOSIS — Z5112 Encounter for antineoplastic immunotherapy: Secondary | ICD-10-CM | POA: Insufficient documentation

## 2021-01-14 DIAGNOSIS — Z95828 Presence of other vascular implants and grafts: Secondary | ICD-10-CM

## 2021-01-14 DIAGNOSIS — R197 Diarrhea, unspecified: Secondary | ICD-10-CM | POA: Diagnosis not present

## 2021-01-14 DIAGNOSIS — D6481 Anemia due to antineoplastic chemotherapy: Secondary | ICD-10-CM | POA: Diagnosis not present

## 2021-01-14 DIAGNOSIS — Z803 Family history of malignant neoplasm of breast: Secondary | ICD-10-CM | POA: Diagnosis not present

## 2021-01-14 DIAGNOSIS — F17210 Nicotine dependence, cigarettes, uncomplicated: Secondary | ICD-10-CM | POA: Diagnosis not present

## 2021-01-14 DIAGNOSIS — Z5111 Encounter for antineoplastic chemotherapy: Secondary | ICD-10-CM | POA: Diagnosis not present

## 2021-01-14 DIAGNOSIS — R059 Cough, unspecified: Secondary | ICD-10-CM | POA: Diagnosis not present

## 2021-01-14 LAB — CBC WITH DIFFERENTIAL (CANCER CENTER ONLY)
Abs Immature Granulocytes: 0.01 10*3/uL (ref 0.00–0.07)
Basophils Absolute: 0 10*3/uL (ref 0.0–0.1)
Basophils Relative: 1 %
Eosinophils Absolute: 0.1 10*3/uL (ref 0.0–0.5)
Eosinophils Relative: 2 %
HCT: 33.9 % — ABNORMAL LOW (ref 36.0–46.0)
Hemoglobin: 10.9 g/dL — ABNORMAL LOW (ref 12.0–15.0)
Immature Granulocytes: 0 %
Lymphocytes Relative: 57 %
Lymphs Abs: 2.4 10*3/uL (ref 0.7–4.0)
MCH: 29.2 pg (ref 26.0–34.0)
MCHC: 32.2 g/dL (ref 30.0–36.0)
MCV: 90.9 fL (ref 80.0–100.0)
Monocytes Absolute: 0.7 10*3/uL (ref 0.1–1.0)
Monocytes Relative: 17 %
Neutro Abs: 1 10*3/uL — ABNORMAL LOW (ref 1.7–7.7)
Neutrophils Relative %: 23 %
Platelet Count: 188 10*3/uL (ref 150–400)
RBC: 3.73 MIL/uL — ABNORMAL LOW (ref 3.87–5.11)
RDW: 13.7 % (ref 11.5–15.5)
WBC Count: 4.3 10*3/uL (ref 4.0–10.5)
nRBC: 0 % (ref 0.0–0.2)

## 2021-01-14 LAB — CMP (CANCER CENTER ONLY)
ALT: 20 U/L (ref 0–44)
AST: 20 U/L (ref 15–41)
Albumin: 3.8 g/dL (ref 3.5–5.0)
Alkaline Phosphatase: 63 U/L (ref 38–126)
Anion gap: 9 (ref 5–15)
BUN: 12 mg/dL (ref 6–20)
CO2: 26 mmol/L (ref 22–32)
Calcium: 9.2 mg/dL (ref 8.9–10.3)
Chloride: 106 mmol/L (ref 98–111)
Creatinine: 0.71 mg/dL (ref 0.44–1.00)
GFR, Estimated: >60 mL/min (ref >60)
Glucose, Bld: 91 mg/dL (ref 70–99)
Potassium: 3.8 mmol/L (ref 3.5–5.1)
Sodium: 141 mmol/L (ref 135–145)
Total Bilirubin: 0.3 mg/dL (ref 0.3–1.2)
Total Protein: 6.7 g/dL (ref 6.5–8.1)

## 2021-01-14 IMAGING — CR DG CHEST 2V
1 series · 1 of 1 positions shown · non-contrast
Comparison: [DATE]

CLINICAL DATA: Cough and congestion for several weeks, history of
breast carcinoma on the right

EXAM:
CHEST - 2 VIEW

[w chest pa]
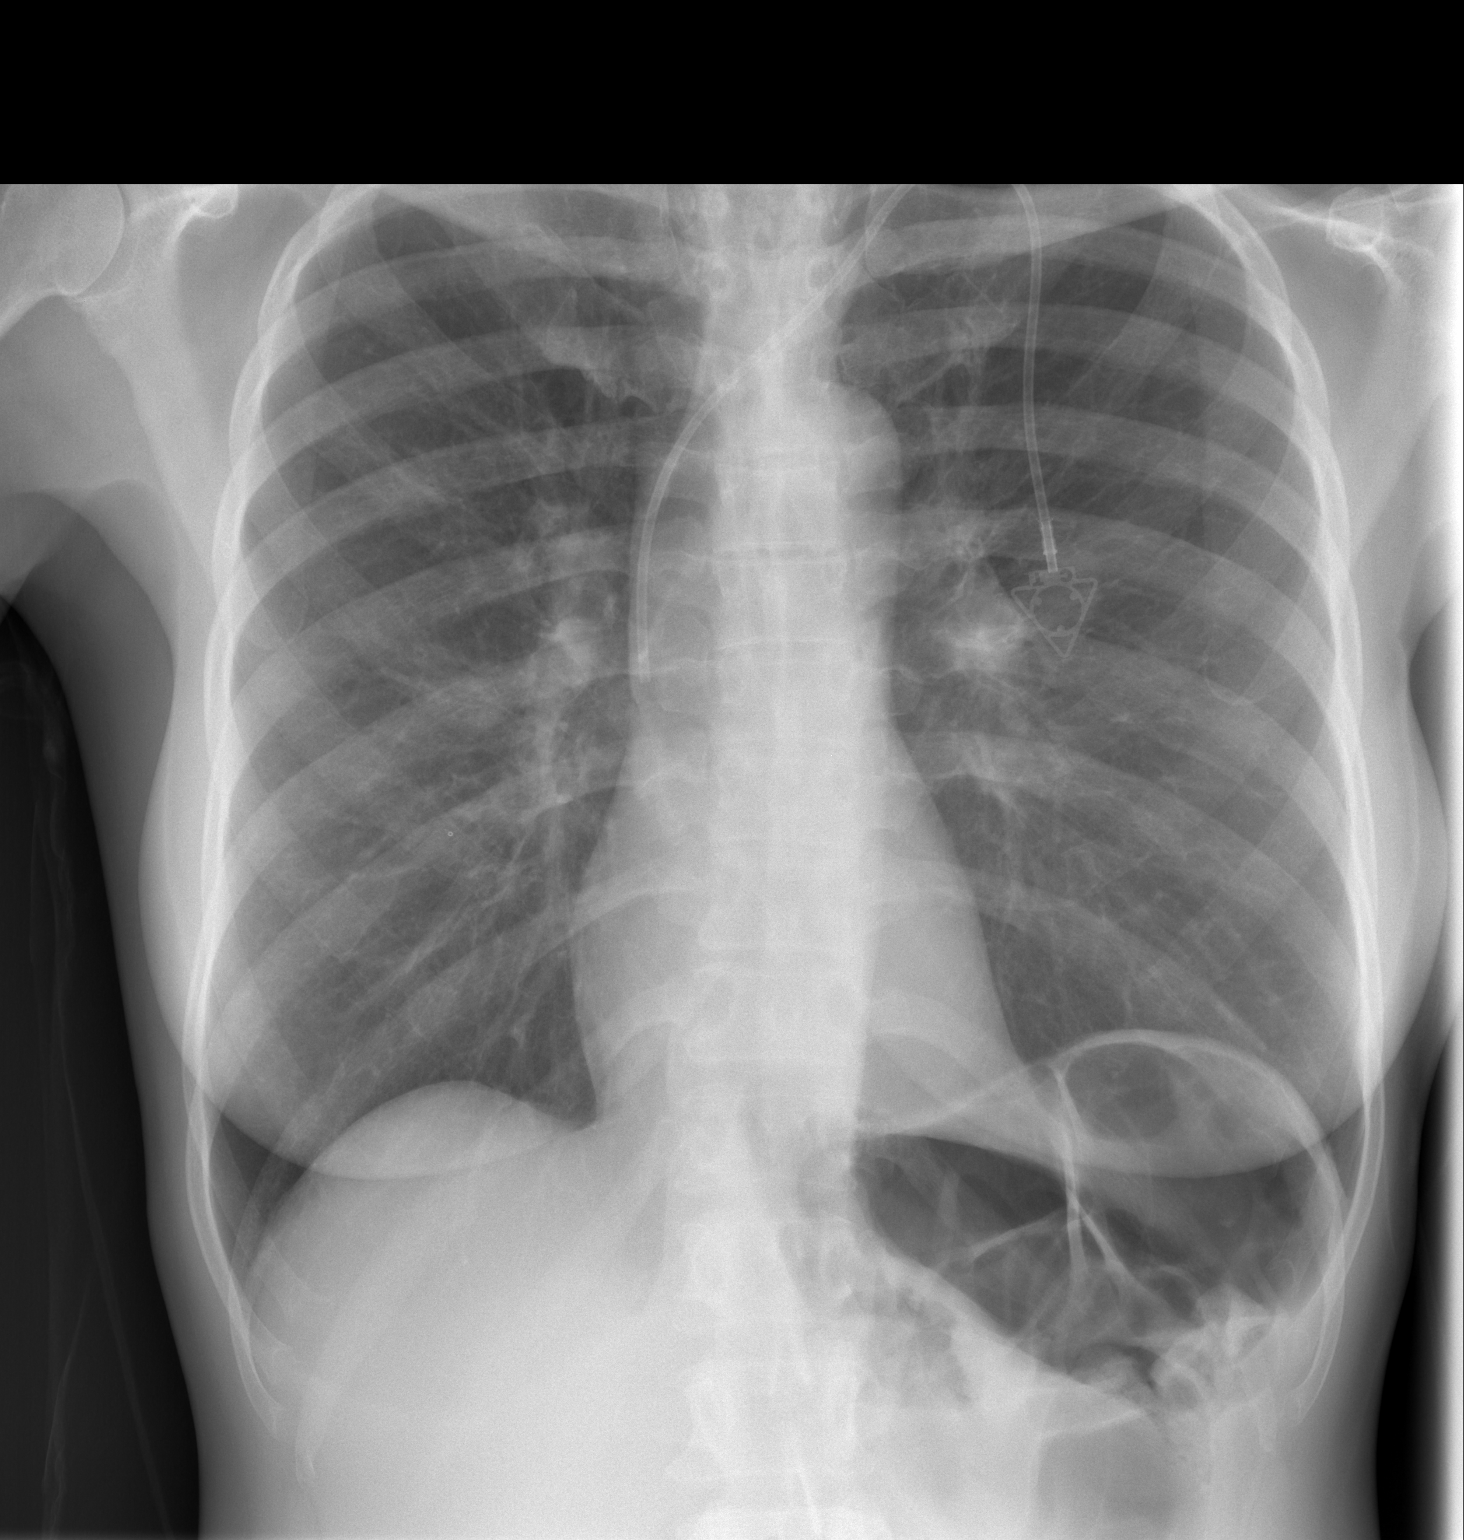

[1 of 1 positions shown; findings below may reference images not displayed]

FINDINGS: Cardiac shadow is stable. Left-sided chest wall port is again seen
and stable. No focal infiltrate or sizable effusion is seen. No
sizable parenchymal nodules are noted. No acute bony abnormality is
seen.
IMPRESSION: No active cardiopulmonary disease.

## 2021-01-14 MED ORDER — SODIUM CHLORIDE 0.9% FLUSH
10.0000 mL | Freq: Once | INTRAVENOUS | Status: AC
  Administered 2021-01-14: 10 mL
  Filled 2021-01-14: qty 10

## 2021-01-14 MED ORDER — HEPARIN SOD (PORK) LOCK FLUSH 100 UNIT/ML IV SOLN
500.0000 [IU] | Freq: Once | INTRAVENOUS | Status: AC
  Administered 2021-01-14: 500 [IU] via INTRAVENOUS
  Filled 2021-01-14: qty 5

## 2021-01-14 NOTE — Assessment & Plan Note (Addendum)
Patient palpated a right breast lump x70yrand a left breast lump x249yrDiagnostic mammogram and US[place]ore[place]owed in the right breast, a 2.7cm mass 5cm from the nipple and 0.8cm mass 1cm from the nipple at the 12:30 position, with borderline cortical thickening in the right axilla, and in the left breast, a 4.0cm mass at the 11 o'clock position representing a hamartoma. 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  Treatment plan: 1. Neoadjuvant chemotherapy with TCH Perjeta 6 cycles followed by Herceptin maintenance for 1 year 2. Followed by breast conserving surgery if possible with sentinel lymph node study 3. Followed by adjuvant radiation therapy if patient had lumpectomy 4.Followed by adjuvant antiestrogen therapy ------------------------------------------------------------------------------------------------------------------------------------------- Current treatment: Cycle 6TCH Perjeta Echocardiogram 09/06/2020: EF 60 to 65%  We reviewed her MRI.  She will see her surgeon next week to discuss her upcoming surgery.  She will continue on Herceptin every three weeks, and I placed orders for her repeat echocardiogram.    Chemo toxicities: 1.Diarrhea: She now knows how to manage diarrhea with Imodium. 2.nausea and vomiting:controlled with prilosec and antiemetics. 3.Fatigue 4.Lack of taste and appetite: I suggested she try additional flavors or a splash of vinegar for her foods. 5.  Elevated LFTs: labs pending today.   6.  Chemo induced anemia: Monitoring 7.  Elevated blood sugars: Improved with discontinuation of dexamethasone 8. ANC 1.0.  She will proceed with treatment Sunday, and receive growth factor on day 3 of her last cycle.  Her ANC was 1.0 with cycle 5 of treatment as well and she tolerated her treatment well.    9.  Persistent cough:  likely residual from a virus she had several weeks ago as it is improving.  We will get a chest xray.    We will see her back in 3 weeks for her next treatment.

## 2021-01-14 NOTE — Progress Notes (Signed)
[facility] Follow up:    Amanda Rm, NP-C [place] [id number]   DIAGNOSIS: Cancer Staging Malignant neoplasm of 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, estrogen receptor positive ([place]) Staging form: Breast, AJCC 8th Edition - Clinical stage from 09/01/2020: Stage IIA (cT2, cN0, cM0, G3, ER+, PR-, HER2+) - Unsigned   SUMMARY OF ONCOLOGIC HISTORY: Oncology History  Malignant neoplasm of 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, estrogen receptor positive ([place])  08/25/2020 Initial Diagnosis   Patient palpated a right breast lump x13yr and a left breast lump x56yr. Diagnostic mammogram and US showed in the right breast, a 2.7cm mass 5cm from the nipple and 0.8cm mass 1cm from the nipple at the 12:30 position, with borderline cortical thickening in the right axilla, and in the left breast, a 4.0cm mass at the 11 o'clock position representing a hamartoma. 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   09/13/2020 -  Chemotherapy    Patient is on Treatment Plan: BREAST  DOCETAXEL + CARBOPLATIN + TRASTUZUMAB + PERTUZUMAB  (TCHP) Q21D         CURRENT THERAPY: TCHP cycle 6   INTERVAL HISTORY: Amanda Rich 55 y.o. female returns for evaluation prior to receiving her final cycle of chemotherapy on Sunday.  She notes she has a cold since Christmas, and it hasn't resolved. She has not covid tested, however notes it is an intermittent cough, that has been persistent, with no other issues..  She denies shortness of breath.  She has mild intermittent peripheral neuropathy in the pads of the tips of her toes.  No motor changes, she does not some occasional cramping in her toes.    She underwent breast MRI on 01/13/2021 that showed near complete resolution of her right breast mass.  There were no abnormal  appearing lymph nodes.  She notes she is feeling well and a detailed ROS was otherwise non contributory.     Patient Active Problem List   Diagnosis Date Noted  . Family history of breast cancer   . Family history of liver cancer   . Malignant neoplasm of 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, estrogen receptor positive ([place]) 08/25/2020  . Abnormal chest CT 01/30/2019  . History of pericarditis 01/30/2019  . Pericarditis 01/22/2019  . Chest pain 12/26/2018  . History of endometrial ablation 01/21/2018  . Family history of colon cancer 09/24/2017  . Poor diet 01/24/2017  . Weight loss, abnormal 01/24/2017  . Smoker 09/22/2016  . Mixed hyperlipidemia 08/07/2016  . Irregular intermenstrual bleeding 08/07/2016  . Depression 08/07/2016  . Fat necrosis of peritoneum ([place]) 08/07/2016  . Pelvic congestion syndrome 08/07/2016    is allergic to clarithromycin.  MEDICAL HISTORY: Past Medical History:  Diagnosis Date  . Anemia   . Depression   . Family history of breast cancer   . Family history of liver cancer   . Smoker   . Umbilical hernia     SURGICAL HISTORY: Past Surgical History:  Procedure Laterality Date  . ABLATION ON ENDOMETRIOSIS    . GANGLION CYST EXCISION  2007   L wrist; [facility], [place]  . IR IMAGING GUIDED PORT INSERTION  09/10/2020  . TUBAL LIGATION     [facility]  . UMBILICAL HERNIA REPAIR  2005   [facility]  HISTORY: Social History   Socioeconomic History  . Marital status: Legally Separated    Spouse name: Not on file  . Number of children: 3  . Years of education: Not on file  . Highest education level: Associate degree: academic program  Occupational History  . Not on file  Tobacco Use  . Smoking status: Current Some Day Smoker    Packs/day: 1.00    Years: 25.00    Pack years: 25.00    Types: Cigarettes  . Smokeless tobacco: Never Used  . Tobacco comment: smoking cigarettes again and not e-cigs  Vaping Use  . Vaping Use: Never used   Substance and Sexual Activity  . Alcohol use: Yes    Alcohol/week: 1.0 standard drink    Types: 1 Glasses of wine per week  . Drug use: No  . Sexual activity: Yes    Birth control/protection: Post-menopausal  Other Topics Concern  . Not on file  Social History Narrative  . Not on file   Social Determinants of Health   Financial Resource Strain: High Risk  . Difficulty of Paying Living Expenses: Very hard  Food Insecurity: Food Insecurity Present  . Worried About Programme researcher, broadcasting/film/video in the Last Year: Sometimes true  . Ran Out of Food in the Last Year: Never true  Transportation Needs: No Transportation Needs  . Lack of Transportation (Medical): No  . Lack of Transportation (Non-Medical): No  Physical Activity: Not on file  Stress: Not on file  Social Connections: Not on file  Intimate Partner Violence: Not on file    FAMILY HISTORY: Family History  Problem Relation Age of Onset  . Diabetes Mother   . Hypertension Mother   . Liver cancer Daughter 72  . Diabetes Maternal Aunt   . Breast cancer Maternal Aunt 43  . Colon cancer Maternal Uncle 66       or prostate cancer  . Colon cancer Cousin   . Breast cancer Cousin   . Autism Half-Brother   . Breast cancer Other 37       maternal second cousin  . Diabetes Paternal Uncle   . Breast cancer Other        dx. 42s, paternal second cousin  . Lung cancer Other        dx. 67s, paternal cousin once removed (father's cousin)    Review of Systems  Constitutional: Positive for fatigue. Negative for appetite change, chills, fever and unexpected weight change.  HENT:   Negative for hearing loss, lump/mass and trouble swallowing.   Eyes: Negative for eye problems and icterus.  Respiratory: Negative for chest tightness, cough and shortness of breath.   Cardiovascular: Negative for chest pain, leg swelling and palpitations.  Gastrointestinal: Negative for abdominal distention, abdominal pain, constipation, diarrhea, nausea and  vomiting.  Endocrine: Negative for hot flashes.  Genitourinary: Negative for difficulty urinating.   Musculoskeletal: Negative for arthralgias.  Skin: Negative for itching and rash.  Neurological: Negative for dizziness, extremity weakness, headaches and numbness.  Hematological: Negative for adenopathy. Does not bruise/bleed easily.  Psychiatric/Behavioral: Negative for depression. The patient is not nervous/anxious.       PHYSICAL EXAMINATION  ECOG PERFORMANCE STATUS: 1 - Symptomatic but completely ambulatory  Vitals:   01/14/21 1414  BP: (!) 106/55  Pulse: 79  Resp: 18  Temp: 97.7 F (36.5 C)  SpO2: 100%    Physical Exam Constitutional:      General: She is not in acute distress.    Appearance: Normal  appearance. She is not toxic-appearing.  HENT:     Head: Normocephalic and atraumatic.  Eyes:     General: No scleral icterus. Cardiovascular:     Rate and Rhythm: Normal rate and regular rhythm.     Pulses: Normal pulses.     Heart sounds: Normal heart sounds.  Pulmonary:     Effort: Pulmonary effort is normal.     Breath sounds: Normal breath sounds.  Abdominal:     General: Abdomen is flat. Bowel sounds are normal. There is no distension.     Palpations: Abdomen is soft.     Tenderness: There is no abdominal tenderness.  Musculoskeletal:        General: No swelling.     Cervical back: Neck supple.  Lymphadenopathy:     Cervical: No cervical adenopathy.  Skin:    General: Skin is warm and dry.     Findings: No rash.  Neurological:     General: No focal deficit present.     Mental Status: She is alert.  Psychiatric:        Mood and Affect: Mood normal.        Behavior: Behavior normal.     LABORATORY DATA:  CBC    Component Value Date/Time   WBC 4.3 01/14/2021 1355   WBC 6.4 10/30/2020 1200   RBC 3.73 (L) 01/14/2021 1355   HGB 10.9 (L) 01/14/2021 1355   HCT 33.9 (L) 01/14/2021 1355   PLT 188 01/14/2021 1355   MCV 90.9 01/14/2021 1355   MCH  29.2 01/14/2021 1355   MCHC 32.2 01/14/2021 1355   RDW 13.7 01/14/2021 1355   LYMPHSABS 2.4 01/14/2021 1355   MONOABS 0.7 01/14/2021 1355   EOSABS 0.1 01/14/2021 1355   BASOSABS 0.0 01/14/2021 1355    CMP     Component Value Date/Time   NA 141 01/14/2021 1355   K 3.8 01/14/2021 1355   CL 106 01/14/2021 1355   CO2 26 01/14/2021 1355   GLUCOSE 91 01/14/2021 1355   BUN 12 01/14/2021 1355   CREATININE 0.71 01/14/2021 1355   CREATININE 0.68 09/24/2017 0827   CALCIUM 9.2 01/14/2021 1355   PROT 6.7 01/14/2021 1355   ALBUMIN 3.8 01/14/2021 1355   AST 20 01/14/2021 1355   ALT 20 01/14/2021 1355   ALKPHOS 63 01/14/2021 1355   BILITOT 0.3 01/14/2021 1355   GFRNONAA >60 01/14/2021 1355   GFRNONAA >89 09/22/2016 1501   GFRAA >60 09/20/2020 1415   GFRAA >89 09/22/2016 1501       PENDING LABS:   RADIOGRAPHIC STUDIES:  DG Chest 2 View  Result Date: 01/14/2021 CLINICAL DATA:  Cough and congestion for several weeks, history of breast carcinoma on the right EXAM: CHEST - 2 VIEW COMPARISON:  10/30/2020 FINDINGS: Cardiac shadow is stable. Left-sided chest wall port is again seen and stable. No focal infiltrate or sizable effusion is seen. No sizable parenchymal nodules are noted. No acute bony abnormality is seen. IMPRESSION: No active cardiopulmonary disease. Electronically Signed   By: Alcide Clever M.D.   On: 01/14/2021 15:11       ASSESSMENT and THERAPY PLAN:   Malignant neoplasm of 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, estrogen receptor positive ([facility]) Patient palpated a right breast lump x82yr and a left breast lump x42yr. Diagnostic mammogram and US showed in the right breast, a 2.7cm mass 5cm from the nipple and 0.8cm mass 1cm from the nipple at the 12:30 position, with borderline cortical thickening in the right  axilla, and in the left breast, a 4.0cm mass at the 11 o'clock position representing a hamartoma. Right breast biopsy showed IDC at the 12:30 position, HER-2  equivocal by IHC, positive by FISH, ER+ 30% weak, PR- 0%, Ki67 50%, and benign findings 1cm from the nipple and in the axilla.  Treatment plan: 1. Neoadjuvant chemotherapy with TCH Perjeta 6 cycles followed by Herceptin maintenance for 1 year 2. Followed by breast conserving surgery if possible with sentinel lymph node study 3. Followed by adjuvant radiation therapy if patient had lumpectomy 4.Followed by adjuvant antiestrogen therapy ------------------------------------------------------------------------------------------------------------------------------------------- Current treatment: Cycle 6TCH Perjeta Echocardiogram 09/06/2020: EF 60 to 65%  We reviewed her MRI.  She will see her surgeon next week to discuss her upcoming surgery.  She will continue on Herceptin every three weeks, and I placed orders for her repeat echocardiogram.    Chemo toxicities: 1.Diarrhea: She now knows how to manage diarrhea with Imodium. 2.nausea and vomiting:controlled with prilosec and antiemetics. 3.Fatigue 4.Lack of taste and appetite: I suggested she try additional flavors or a splash of vinegar for her foods. 5.  Elevated LFTs: labs pending today.   6.  Chemo induced anemia: Monitoring 7.  Elevated blood sugars: Improved with discontinuation of dexamethasone 8. ANC 1.0.  She will proceed with treatment Sunday, and receive growth factor on day 3 of her last cycle.  Her ANC was 1.0 with cycle 5 of treatment as well and she tolerated her treatment well.    9.  Persistent cough: likely residual from a virus she had several weeks ago as it is improving.  We will get a chest xray.    We will see her back in 3 weeks for her next treatment.        Orders Placed This Encounter  Procedures  . DG Chest 2 View    Standing Status:   Future    Number of Occurrences:   1    Standing Expiration Date:   01/14/2022    Order Specific Question:   Reason for Exam (SYMPTOM  OR DIAGNOSIS REQUIRED)     Answer:   cough x 4 weeks    Order Specific Question:   Is patient pregnant?    Answer:   No    Order Specific Question:   Preferred imaging location?    Answer:   [facility]  . ECHOCARDIOGRAM COMPLETE    Standing Status:   Future    Standing Expiration Date:   01/16/2022    Order Specific Question:   Where should this test be performed    Answer:   [facility]    Order Specific Question:   Perflutren DEFINITY (image enhancing agent) should be administered unless hypersensitivity or allergy exist    Answer:   Administer Perflutren    Order Specific Question:   Reason for exam-Echo    Answer:   Chemo  Z09    All questions were answered. The patient knows to call the clinic with any problems, questions or concerns. We can certainly see the patient much sooner if necessary.  Total encounter time: 30 minutes*  Wilber Bihari, NP 01/16/21 12:43 PM Medical Oncology and Hematology [facility] [place], [place] [address] Tel. [phone]    Fax. [phone]  *Total Encounter Time as defined by the Centers for Medicare and Medicaid Services includes, in addition to the face-to-face time of a patient visit (documented in the note above) non-face-to-face time: obtaining and reviewing outside history, ordering and  reviewing medications, tests or procedures, care coordination (communications with other health care professionals or caregivers) and documentation in the medical record.  

## 2021-01-16 ENCOUNTER — Inpatient Hospital Stay: Payer: Medicaid Other

## 2021-01-16 ENCOUNTER — Other Ambulatory Visit: Payer: Self-pay

## 2021-01-16 VITALS — BP 105/73 | HR 94 | Temp 97.9°F | Resp 18

## 2021-01-16 DIAGNOSIS — C50211 Malignant neoplasm of upper-inner quadrant of right female breast: Secondary | ICD-10-CM | POA: Diagnosis not present

## 2021-01-16 DIAGNOSIS — Z803 Family history of malignant neoplasm of breast: Secondary | ICD-10-CM | POA: Diagnosis not present

## 2021-01-16 DIAGNOSIS — Z5111 Encounter for antineoplastic chemotherapy: Secondary | ICD-10-CM | POA: Diagnosis not present

## 2021-01-16 DIAGNOSIS — Z5112 Encounter for antineoplastic immunotherapy: Secondary | ICD-10-CM | POA: Diagnosis not present

## 2021-01-16 DIAGNOSIS — R197 Diarrhea, unspecified: Secondary | ICD-10-CM | POA: Diagnosis not present

## 2021-01-16 DIAGNOSIS — Z170 Estrogen receptor positive status [ER+]: Secondary | ICD-10-CM

## 2021-01-16 DIAGNOSIS — F17210 Nicotine dependence, cigarettes, uncomplicated: Secondary | ICD-10-CM | POA: Diagnosis not present

## 2021-01-16 DIAGNOSIS — D6481 Anemia due to antineoplastic chemotherapy: Secondary | ICD-10-CM | POA: Diagnosis not present

## 2021-01-16 DIAGNOSIS — R112 Nausea with vomiting, unspecified: Secondary | ICD-10-CM | POA: Diagnosis not present

## 2021-01-16 MED ORDER — DIPHENHYDRAMINE HCL 25 MG PO CAPS
50.0000 mg | ORAL_CAPSULE | Freq: Once | ORAL | Status: AC
  Administered 2021-01-16: 50 mg via ORAL

## 2021-01-16 MED ORDER — PALONOSETRON HCL INJECTION 0.25 MG/5ML
INTRAVENOUS | Status: AC
  Filled 2021-01-16: qty 5

## 2021-01-16 MED ORDER — ACETAMINOPHEN 325 MG PO TABS
650.0000 mg | ORAL_TABLET | Freq: Once | ORAL | Status: AC
  Administered 2021-01-16: 650 mg via ORAL

## 2021-01-16 MED ORDER — ACETAMINOPHEN 325 MG PO TABS
ORAL_TABLET | ORAL | Status: AC
  Filled 2021-01-16: qty 2

## 2021-01-16 MED ORDER — SODIUM CHLORIDE 0.9 % IV SOLN
150.0000 mg | Freq: Once | INTRAVENOUS | Status: AC
  Administered 2021-01-16: 150 mg via INTRAVENOUS
  Filled 2021-01-16: qty 150
  Filled 2021-01-16: qty 5

## 2021-01-16 MED ORDER — SODIUM CHLORIDE 0.9% FLUSH
10.0000 mL | INTRAVENOUS | Status: DC | PRN
  Administered 2021-01-16: 10 mL
  Filled 2021-01-16: qty 10

## 2021-01-16 MED ORDER — TRASTUZUMAB-ANNS CHEMO 150 MG IV SOLR
6.0000 mg/kg | Freq: Once | INTRAVENOUS | Status: AC
  Administered 2021-01-16: 315 mg via INTRAVENOUS
  Filled 2021-01-16: qty 15

## 2021-01-16 MED ORDER — SODIUM CHLORIDE 0.9 % IV SOLN
466.5000 mg | Freq: Once | INTRAVENOUS | Status: AC
  Administered 2021-01-16: 470 mg via INTRAVENOUS
  Filled 2021-01-16: qty 47

## 2021-01-16 MED ORDER — SODIUM CHLORIDE 0.9 % IV SOLN
420.0000 mg | Freq: Once | INTRAVENOUS | Status: AC
  Administered 2021-01-16: 420 mg via INTRAVENOUS
  Filled 2021-01-16: qty 14

## 2021-01-16 MED ORDER — SODIUM CHLORIDE 0.9 % IV SOLN
Freq: Once | INTRAVENOUS | Status: AC
  Filled 2021-01-16: qty 250

## 2021-01-16 MED ORDER — SODIUM CHLORIDE 0.9 % IV SOLN
10.0000 mg | Freq: Once | INTRAVENOUS | Status: AC
  Administered 2021-01-16: 10 mg via INTRAVENOUS
  Filled 2021-01-16: qty 10
  Filled 2021-01-16: qty 1

## 2021-01-16 MED ORDER — SODIUM CHLORIDE 0.9 % IV SOLN
60.0000 mg/m2 | Freq: Once | INTRAVENOUS | Status: AC
  Administered 2021-01-16: 90 mg via INTRAVENOUS
  Filled 2021-01-16: qty 9

## 2021-01-16 MED ORDER — PALONOSETRON HCL INJECTION 0.25 MG/5ML
0.2500 mg | Freq: Once | INTRAVENOUS | Status: AC
  Administered 2021-01-16: 0.25 mg via INTRAVENOUS

## 2021-01-16 MED ORDER — DIPHENHYDRAMINE HCL 25 MG PO CAPS
ORAL_CAPSULE | ORAL | Status: AC
  Filled 2021-01-16: qty 2

## 2021-01-16 MED ORDER — HEPARIN SOD (PORK) LOCK FLUSH 100 UNIT/ML IV SOLN
500.0000 [IU] | Freq: Once | INTRAVENOUS | Status: AC | PRN
  Administered 2021-01-16: 500 [IU]
  Filled 2021-01-16: qty 5

## 2021-01-16 NOTE — Progress Notes (Signed)
Per Wilber Bihari NP, patient ok to treat with ANC 1.0. Will proceed with treatment as ordered.

## 2021-01-16 NOTE — Patient Instructions (Signed)
[facility] Discharge Instructions for Patients Receiving Chemotherapy  Today you received the following chemotherapy agents: trastuzumab, pertuzumab, docetaxel,and carboplatin.  To help prevent nausea and vomiting after your treatment, we encourage you to take your nausea medication as directed.   If you develop nausea and vomiting that is not controlled by your nausea medication, call the clinic.   BELOW ARE SYMPTOMS THAT SHOULD BE REPORTED IMMEDIATELY:  *FEVER GREATER THAN 100.5 F  *CHILLS WITH OR WITHOUT FEVER  NAUSEA AND VOMITING THAT IS NOT CONTROLLED WITH YOUR NAUSEA MEDICATION  *UNUSUAL SHORTNESS OF BREATH  *UNUSUAL BRUISING OR BLEEDING  TENDERNESS IN MOUTH AND THROAT WITH OR WITHOUT PRESENCE OF ULCERS  *URINARY PROBLEMS  *BOWEL PROBLEMS  UNUSUAL RASH Items with * indicate a potential emergency and should be followed up as soon as possible.  Feel free to call the clinic should you have any questions or concerns. The clinic phone number is [phone].  Please show the CHEMO ALERT CARD at check-in to the Emergency Department and triage nurse.   

## 2021-01-17 ENCOUNTER — Telehealth: Payer: Self-pay | Admitting: Adult Health

## 2021-01-17 ENCOUNTER — Encounter: Payer: Self-pay | Admitting: *Deleted

## 2021-01-17 DIAGNOSIS — C50911 Malignant neoplasm of unspecified site of right female breast: Secondary | ICD-10-CM | POA: Diagnosis not present

## 2021-01-17 NOTE — Telephone Encounter (Signed)
No 12/1 los. No changes made to pt's schedule.  

## 2021-01-18 ENCOUNTER — Inpatient Hospital Stay: Payer: Medicaid Other

## 2021-01-18 ENCOUNTER — Other Ambulatory Visit: Payer: Self-pay

## 2021-01-18 VITALS — BP 106/71 | HR 75

## 2021-01-18 DIAGNOSIS — C50211 Malignant neoplasm of upper-inner quadrant of right female breast: Secondary | ICD-10-CM | POA: Diagnosis not present

## 2021-01-18 DIAGNOSIS — R197 Diarrhea, unspecified: Secondary | ICD-10-CM | POA: Diagnosis not present

## 2021-01-18 DIAGNOSIS — Z5111 Encounter for antineoplastic chemotherapy: Secondary | ICD-10-CM | POA: Diagnosis not present

## 2021-01-18 DIAGNOSIS — D6481 Anemia due to antineoplastic chemotherapy: Secondary | ICD-10-CM | POA: Diagnosis not present

## 2021-01-18 DIAGNOSIS — Z5112 Encounter for antineoplastic immunotherapy: Secondary | ICD-10-CM | POA: Diagnosis not present

## 2021-01-18 DIAGNOSIS — Z803 Family history of malignant neoplasm of breast: Secondary | ICD-10-CM | POA: Diagnosis not present

## 2021-01-18 DIAGNOSIS — Z170 Estrogen receptor positive status [ER+]: Secondary | ICD-10-CM | POA: Diagnosis not present

## 2021-01-18 DIAGNOSIS — R112 Nausea with vomiting, unspecified: Secondary | ICD-10-CM | POA: Diagnosis not present

## 2021-01-18 DIAGNOSIS — F17210 Nicotine dependence, cigarettes, uncomplicated: Secondary | ICD-10-CM | POA: Diagnosis not present

## 2021-01-18 MED ORDER — PEGFILGRASTIM-CBQV 6 MG/0.6ML ~~LOC~~ SOSY
6.0000 mg | PREFILLED_SYRINGE | Freq: Once | SUBCUTANEOUS | Status: AC
  Administered 2021-01-18: 6 mg via SUBCUTANEOUS

## 2021-01-18 MED ORDER — PEGFILGRASTIM-CBQV 6 MG/0.6ML ~~LOC~~ SOSY
PREFILLED_SYRINGE | SUBCUTANEOUS | Status: AC
  Filled 2021-01-18: qty 0.6

## 2021-01-18 NOTE — Patient Instructions (Signed)
Pegfilgrastim injection What is this medicine? PEGFILGRASTIM (PEG fil gra stim) is a long-acting granulocyte colony-stimulating factor that stimulates the growth of neutrophils, a type of white blood cell important in the body's fight against infection. It is used to reduce the incidence of fever and infection in patients with certain types of cancer who are receiving chemotherapy that affects the bone marrow, and to increase survival after being exposed to high doses of radiation. This medicine may be used for other purposes; ask your health care provider or pharmacist if you have questions. COMMON BRAND NAME(S): Fulphila, Neulasta, Nyvepria, UDENYCA, Ziextenzo What should I tell my health care provider before I take this medicine? They need to know if you have any of these conditions:  kidney disease  latex allergy  ongoing radiation therapy  sickle cell disease  skin reactions to acrylic adhesives (On-Body Injector only)  an unusual or allergic reaction to pegfilgrastim, filgrastim, other medicines, foods, dyes, or preservatives  pregnant or trying to get pregnant  breast-feeding How should I use this medicine? This medicine is for injection under the skin. If you get this medicine at home, you will be taught how to prepare and give the pre-filled syringe or how to use the On-body Injector. Refer to the patient Instructions for Use for detailed instructions. Use exactly as directed. Tell your healthcare provider immediately if you suspect that the On-body Injector may not have performed as intended or if you suspect the use of the On-body Injector resulted in a missed or partial dose. It is important that you put your used needles and syringes in a special sharps container. Do not put them in a trash can. If you do not have a sharps container, call your pharmacist or healthcare provider to get one. Talk to your pediatrician regarding the use of this medicine in children. While this drug  may be prescribed for selected conditions, precautions do apply. Overdosage: If you think you have taken too much of this medicine contact a poison control center or emergency room at once. NOTE: This medicine is only for you. Do not share this medicine with others. What if I miss a dose? It is important not to miss your dose. Call your doctor or health care professional if you miss your dose. If you miss a dose due to an On-body Injector failure or leakage, a new dose should be administered as soon as possible using a single prefilled syringe for manual use. What may interact with this medicine? Interactions have not been studied. This list may not describe all possible interactions. Give your health care provider a list of all the medicines, herbs, non-prescription drugs, or dietary supplements you use. Also tell them if you smoke, drink alcohol, or use illegal drugs. Some items may interact with your medicine. What should I watch for while using this medicine? Your condition will be monitored carefully while you are receiving this medicine. You may need blood work done while you are taking this medicine. Talk to your health care provider about your risk of cancer. You may be more at risk for certain types of cancer if you take this medicine. If you are going to need a MRI, CT scan, or other procedure, tell your doctor that you are using this medicine (On-Body Injector only). What side effects may I notice from receiving this medicine? Side effects that you should report to your doctor or health care professional as soon as possible:  allergic reactions (skin rash, itching or hives, swelling of   the face, lips, or tongue)  back pain  dizziness  fever  pain, redness, or irritation at site where injected  pinpoint red spots on the skin  red or dark-brown urine  shortness of breath or breathing problems  stomach or side pain, or pain at the shoulder  swelling  tiredness  trouble  passing urine or change in the amount of urine  unusual bruising or bleeding Side effects that usually do not require medical attention (report to your doctor or health care professional if they continue or are bothersome):  bone pain  muscle pain This list may not describe all possible side effects. Call your doctor for medical advice about side effects. You may report side effects to FDA at 1-800-FDA-1088. Where should I keep my medicine? Keep out of the reach of children. If you are using this medicine at home, you will be instructed on how to store it. Throw away any unused medicine after the expiration date on the label. NOTE: This sheet is a summary. It may not cover all possible information. If you have questions about this medicine, talk to your doctor, pharmacist, or health care provider.  2021 Elsevier/Gold Standard (2020-01-02 13:20:51)  

## 2021-01-19 ENCOUNTER — Other Ambulatory Visit: Payer: Self-pay | Admitting: Surgery

## 2021-01-19 ENCOUNTER — Ambulatory Visit: Payer: Self-pay | Admitting: Surgery

## 2021-01-19 DIAGNOSIS — C50111 Malignant neoplasm of central portion of right female breast: Secondary | ICD-10-CM

## 2021-01-20 ENCOUNTER — Encounter: Payer: Self-pay | Admitting: *Deleted

## 2021-01-20 ENCOUNTER — Telehealth: Payer: Self-pay | Admitting: Hematology and Oncology

## 2021-01-20 DIAGNOSIS — Z170 Estrogen receptor positive status [ER+]: Secondary | ICD-10-CM

## 2021-01-20 DIAGNOSIS — C50211 Malignant neoplasm of upper-inner quadrant of right female breast: Secondary | ICD-10-CM

## 2021-01-20 NOTE — Telephone Encounter (Signed)
Scheduled follow-up appointment per 1/27 schedule message. Patient is aware. 

## 2021-01-21 ENCOUNTER — Telehealth: Payer: Self-pay | Admitting: *Deleted

## 2021-01-21 NOTE — Telephone Encounter (Signed)
Received call from pt stating she was directly exposed to someone that has tested positive for Covid.  Pt states exposure occurred yesterday 01/20/21.  Per MD pt needing to monitor for s/s of Covid over the next week.  If symptoms develop, pt educated to go get tested.  Pt verbalized understanding and appreciative of the advice.

## 2021-01-24 DIAGNOSIS — C50211 Malignant neoplasm of upper-inner quadrant of right female breast: Secondary | ICD-10-CM | POA: Diagnosis not present

## 2021-01-25 DIAGNOSIS — Z419 Encounter for procedure for purposes other than remedying health state, unspecified: Secondary | ICD-10-CM | POA: Diagnosis not present

## 2021-01-26 DIAGNOSIS — C50211 Malignant neoplasm of upper-inner quadrant of right female breast: Secondary | ICD-10-CM | POA: Diagnosis not present

## 2021-01-27 ENCOUNTER — Encounter (HOSPITAL_BASED_OUTPATIENT_CLINIC_OR_DEPARTMENT_OTHER): Payer: Self-pay | Admitting: Surgery

## 2021-01-27 ENCOUNTER — Other Ambulatory Visit: Payer: Self-pay

## 2021-01-28 ENCOUNTER — Encounter: Payer: Self-pay | Admitting: Licensed Clinical Social Worker

## 2021-01-28 ENCOUNTER — Ambulatory Visit (HOSPITAL_COMMUNITY)
Admission: RE | Admit: 2021-01-28 | Discharge: 2021-01-28 | Disposition: A | Discharge status: Home / Self Care | Payer: Medicaid Other | Source: Ambulatory Visit | Attending: Adult Health | Admitting: Adult Health

## 2021-01-28 DIAGNOSIS — Z01818 Encounter for other preprocedural examination: Secondary | ICD-10-CM | POA: Diagnosis not present

## 2021-01-28 DIAGNOSIS — Z170 Estrogen receptor positive status [ER+]: Secondary | ICD-10-CM | POA: Diagnosis not present

## 2021-01-28 DIAGNOSIS — C50211 Malignant neoplasm of upper-inner quadrant of right female breast: Secondary | ICD-10-CM | POA: Diagnosis not present

## 2021-01-28 DIAGNOSIS — F17200 Nicotine dependence, unspecified, uncomplicated: Secondary | ICD-10-CM | POA: Insufficient documentation

## 2021-01-28 DIAGNOSIS — Z0189 Encounter for other specified special examinations: Secondary | ICD-10-CM | POA: Diagnosis not present

## 2021-01-28 LAB — ECHOCARDIOGRAM COMPLETE
Area-P 1/2: 5.38 cm2
Calc EF: 57.8 %
S' Lateral: 3 cm
Single Plane A2C EF: 59.1 %
Single Plane A4C EF: 54.6 %

## 2021-01-28 NOTE — Progress Notes (Signed)
  Echocardiogram 2D Echocardiogram has been performed.  Amanda Rich 01/28/2021, 9:56 AM

## 2021-01-28 NOTE — Progress Notes (Signed)
[place] Clinical Social Work  Patient came to support services with question about what letter can be included with her [facility] [place] application. CSW answered question. No other needs at this time.   Christeen Douglas, LCSW

## 2021-01-29 ENCOUNTER — Other Ambulatory Visit (HOSPITAL_COMMUNITY)
Admission: RE | Admit: 2021-01-29 | Discharge: 2021-01-29 | Disposition: A | Discharge status: Home / Self Care | Payer: Medicaid Other | Source: Ambulatory Visit | Attending: Surgery | Admitting: Surgery

## 2021-01-29 DIAGNOSIS — Z20822 Contact with and (suspected) exposure to covid-19: Secondary | ICD-10-CM | POA: Insufficient documentation

## 2021-01-29 DIAGNOSIS — Z01812 Encounter for preprocedural laboratory examination: Secondary | ICD-10-CM | POA: Insufficient documentation

## 2021-01-29 LAB — SARS CORONAVIRUS 2 (TAT 6-24 HRS): SARS Coronavirus 2: NEGATIVE

## 2021-01-31 ENCOUNTER — Inpatient Hospital Stay: Payer: Medicaid Other

## 2021-01-31 ENCOUNTER — Other Ambulatory Visit: Payer: Self-pay | Admitting: Hematology and Oncology

## 2021-01-31 ENCOUNTER — Other Ambulatory Visit: Payer: Self-pay | Admitting: Oncology

## 2021-01-31 ENCOUNTER — Other Ambulatory Visit: Payer: Self-pay

## 2021-01-31 ENCOUNTER — Inpatient Hospital Stay: Payer: Medicaid Other | Attending: Hematology and Oncology

## 2021-01-31 ENCOUNTER — Inpatient Hospital Stay (HOSPITAL_BASED_OUTPATIENT_CLINIC_OR_DEPARTMENT_OTHER): Payer: Medicaid Other | Admitting: Medical

## 2021-01-31 VITALS — BP 112/77 | HR 81 | Temp 97.5°F | Resp 20 | Ht 65.0 in | Wt 125.9 lb

## 2021-01-31 DIAGNOSIS — G620 Drug-induced polyneuropathy: Secondary | ICD-10-CM | POA: Diagnosis not present

## 2021-01-31 DIAGNOSIS — Z170 Estrogen receptor positive status [ER+]: Secondary | ICD-10-CM

## 2021-01-31 DIAGNOSIS — Z5112 Encounter for antineoplastic immunotherapy: Secondary | ICD-10-CM | POA: Diagnosis not present

## 2021-01-31 DIAGNOSIS — C50211 Malignant neoplasm of upper-inner quadrant of right female breast: Secondary | ICD-10-CM

## 2021-01-31 DIAGNOSIS — Z79899 Other long term (current) drug therapy: Secondary | ICD-10-CM | POA: Insufficient documentation

## 2021-01-31 DIAGNOSIS — T451X5A Adverse effect of antineoplastic and immunosuppressive drugs, initial encounter: Secondary | ICD-10-CM | POA: Diagnosis not present

## 2021-01-31 LAB — CBC WITH DIFFERENTIAL (CANCER CENTER ONLY)
Abs Immature Granulocytes: 0.02 10*3/uL (ref 0.00–0.07)
Basophils Absolute: 0 10*3/uL (ref 0.0–0.1)
Basophils Relative: 1 %
Eosinophils Absolute: 0 10*3/uL (ref 0.0–0.5)
Eosinophils Relative: 0 %
HCT: 31.7 % — ABNORMAL LOW (ref 36.0–46.0)
Hemoglobin: 10.4 g/dL — ABNORMAL LOW (ref 12.0–15.0)
Immature Granulocytes: 0 %
Lymphocytes Relative: 32 %
Lymphs Abs: 1.5 10*3/uL (ref 0.7–4.0)
MCH: 29.1 pg (ref 26.0–34.0)
MCHC: 32.8 g/dL (ref 30.0–36.0)
MCV: 88.5 fL (ref 80.0–100.0)
Monocytes Absolute: 0.6 10*3/uL (ref 0.1–1.0)
Monocytes Relative: 13 %
Neutro Abs: 2.6 10*3/uL (ref 1.7–7.7)
Neutrophils Relative %: 54 %
Platelet Count: 170 10*3/uL (ref 150–400)
RBC: 3.58 MIL/uL — ABNORMAL LOW (ref 3.87–5.11)
RDW: 13.9 % (ref 11.5–15.5)
WBC Count: 4.8 10*3/uL (ref 4.0–10.5)
nRBC: 0 % (ref 0.0–0.2)

## 2021-01-31 LAB — CMP (CANCER CENTER ONLY)
ALT: 16 U/L (ref 0–44)
AST: 20 U/L (ref 15–41)
Albumin: 3.8 g/dL (ref 3.5–5.0)
Alkaline Phosphatase: 75 U/L (ref 38–126)
Anion gap: 7 (ref 5–15)
BUN: 10 mg/dL (ref 6–20)
CO2: 25 mmol/L (ref 22–32)
Calcium: 8.9 mg/dL (ref 8.9–10.3)
Chloride: 108 mmol/L (ref 98–111)
Creatinine: 0.63 mg/dL (ref 0.44–1.00)
GFR, Estimated: >60 mL/min (ref >60)
Glucose, Bld: 92 mg/dL (ref 70–99)
Potassium: 3.6 mmol/L (ref 3.5–5.1)
Sodium: 140 mmol/L (ref 135–145)
Total Bilirubin: 0.3 mg/dL (ref 0.3–1.2)
Total Protein: 6.5 g/dL (ref 6.5–8.1)

## 2021-01-31 MED ORDER — ACETAMINOPHEN 325 MG PO TABS
650.0000 mg | ORAL_TABLET | Freq: Once | ORAL | Status: AC
  Administered 2021-01-31: 650 mg via ORAL

## 2021-01-31 MED ORDER — SODIUM CHLORIDE 0.9% FLUSH
10.0000 mL | Freq: Once | INTRAVENOUS | Status: AC
  Administered 2021-01-31: 10 mL
  Filled 2021-01-31: qty 10

## 2021-01-31 MED ORDER — TRASTUZUMAB-DKST CHEMO 150 MG IV SOLR: 8.0000 mg/kg | Freq: Once | INTRAVENOUS | Status: DC

## 2021-01-31 MED ORDER — SODIUM CHLORIDE 0.9 % IV SOLN
Freq: Once | INTRAVENOUS | Status: AC
  Filled 2021-01-31: qty 250

## 2021-01-31 MED ORDER — HEPARIN SOD (PORK) LOCK FLUSH 100 UNIT/ML IV SOLN
500.0000 [IU] | Freq: Once | INTRAVENOUS | Status: AC | PRN
  Administered 2021-01-31: 500 [IU]
  Filled 2021-01-31: qty 5

## 2021-01-31 MED ORDER — ACETAMINOPHEN 325 MG PO TABS
ORAL_TABLET | ORAL | Status: AC
  Filled 2021-01-31: qty 2

## 2021-01-31 MED ORDER — SODIUM CHLORIDE 0.9% FLUSH
10.0000 mL | INTRAVENOUS | Status: DC | PRN
  Administered 2021-01-31: 10 mL
  Filled 2021-01-31: qty 10

## 2021-01-31 MED ORDER — DIPHENHYDRAMINE HCL 25 MG PO CAPS
25.0000 mg | ORAL_CAPSULE | Freq: Once | ORAL | Status: AC
  Administered 2021-01-31: 25 mg via ORAL

## 2021-01-31 MED ORDER — DIPHENHYDRAMINE HCL 25 MG PO CAPS
ORAL_CAPSULE | ORAL | Status: AC
  Filled 2021-01-31: qty 1

## 2021-01-31 NOTE — Progress Notes (Signed)
No trastuzumab today per pharmacy and Sandi Mealy, PA . Pt verbalized understanding that scheduling will call to reschedule appts to appropriate times.

## 2021-01-31 NOTE — Progress Notes (Signed)
OK to treat.  Micheale Schlack, MHS, PA-C Physician Assistant 

## 2021-01-31 NOTE — Patient Instructions (Signed)

## 2021-01-31 NOTE — Progress Notes (Signed)
[facility] Progress Note   Amanda Rich [id number] July 05, 1966 55 y.o.  Amanda Rich is managed by Dr. Nicholas Lose  Actively treated with chemotherapy/immunotherapy/hormonal therapy: yes  Current therapy: TCHP  Last treated:  01/18/2021 (Cycle #6)  Next scheduled appointment with provider: 02/09/2021  Assessment: Plan:    Malignant neoplasm of 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, estrogen receptor positive ([place])   ER malignant neoplasm of the right breast: [name] presents to the clinic today status post cycle 6 of TCHP which was dosed on 01/18/2021.  According to her last note she is to begin Herceptin today.  It was determined that the patient was 1 week to use early for her treatment.  She will return in 1 week for Herceptin.  She continues to be managed by Dr. Nicholas Lose and is scheduled to be seen in follow-up on 02/21/2021.    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  Patient will be started on gabapentin 300 mg p.o. daily.  Please see After Visit Summary for patient specific instructions.  Future Appointments  Date Time Provider [place]  01/31/2021 10:00 AM Sandi Mealy E., PA-C [facility] None  01/31/2021 11:00 AM [facility] INFUSION [facility] None  02/01/2021  2:00 PM [facility] MM IR 1 [facility] GI-BREAST CE  02/02/2021  9:15 AM [facility] FLUSH [facility] None  02/02/2021  9:15 AM [facility] INJ 1 [facility]  02/02/2021 10:15 AM [facility] BREAST SPECIMEN [facility]-BCGMM GI-BREAST CE  02/03/2021  8:30 AM Nwoko, Uchenna E, PA [facility] None  02/09/2021 11:30 AM Nicholas Lose, MD [facility] None  02/21/2021  9:15 AM [facility] LAB [facility] None  02/21/2021  9:30 AM [facility] [place] FLUSH [facility] None  02/21/2021 10:00 AM Causey, Charlestine Massed, NP [facility] None  02/21/2021 11:00 AM [facility] INFUSION [facility] None  02/23/2021  9:30 AM [facility] [place] FLUSH [facility] None  03/02/2021  1:30 PM [facility] NURSE [facility] None   03/02/2021  2:00 PM Gery Pray, MD [facility] None  03/02/2021  3:00 PM Gery Pray, MD [facility] None  03/16/2021  9:15 AM [facility] [place] FLUSH [facility] None    No orders of the defined types were placed in this encounter.      Subjective:   Patient ID:  Amanda Rich is a 55 y.o. (DOB Nov 28, 1966) female.  Chief Complaint: No chief complaint on file.   HPI Amanda Rich  is a 55 y.o. female with a diagnosis of an ER malignant neoplasm of the right breast. She is managed by Dr. Lindi Adie and presents for consideration of Herceptin only today. Patient complains of tingling and muscle cramps in both her hands and feet. Patient describes tingling sensation as pins-and-needles. These symptoms started about 1 month ago and are sporadic.  For treatment patient will be started on gabapentin 300 mg p.o. daily.    Medications: I have reviewed the patient's current medications.  Allergies:  Allergies  Allergen Reactions  . Clarithromycin Swelling    Biaxin    Past Medical History:  Diagnosis Date  . Anemia   . Cancer [facility])    breast cancer  . Depression   . Family history of breast cancer   . Family history of liver cancer   . Pericarditis   . Smoker   . Umbilical hernia     Past Surgical History:  Procedure Laterality Date  . ABLATION ON ENDOMETRIOSIS    . GANGLION CYST EXCISION  2007   L wrist; [facility], [place]  . IR IMAGING GUIDED PORT  INSERTION  09/10/2020  . TUBAL LIGATION     [facility]  . UMBILICAL HERNIA REPAIR  2005   [facility]    Family History  Problem Relation Age of Onset  . Diabetes Mother   . Hypertension Mother   . Liver cancer Daughter 66  . Diabetes Maternal Aunt   . Breast cancer Maternal Aunt 77  . Colon cancer Maternal Uncle 66       or prostate cancer  . Colon cancer Cousin   . Breast cancer Cousin   . Autism Half-Brother   . Breast cancer Other 18       maternal second cousin  . Diabetes Paternal Uncle   . Breast cancer Other        dx. 40s,  paternal second cousin  . Lung cancer Other        dx. 99s, paternal cousin once removed (father's cousin)    Social History   Socioeconomic History  . Marital status: Legally Separated    Spouse name: Not on file  . Number of children: 3  . Years of education: Not on file  . Highest education level: Associate degree: academic program  Occupational History  . Not on file  Tobacco Use  . Smoking status: Current Some Day Smoker    Packs/day: 1.00    Years: 25.00    Pack years: 25.00    Types: Cigarettes  . Smokeless tobacco: Never Used  . Tobacco comment: smoking cigarettes again and not e-cigs  Vaping Use  . Vaping Use: Never used  Substance and Sexual Activity  . Alcohol use: Yes    Alcohol/week: 1.0 standard drink    Types: 1 Glasses of wine per week  . Drug use: No  . Sexual activity: Yes    Birth control/protection: Post-menopausal  Other Topics Concern  . Not on file  Social History Narrative  . Not on file   Social Determinants of Health   Financial Resource Strain: High Risk  . Difficulty of Paying Living Expenses: Very hard  Food Insecurity: Food Insecurity Present  . Worried About Charity fundraiser in the Last Year: Sometimes true  . Ran Out of Food in the Last Year: Never true  Transportation Needs: No Transportation Needs  . Lack of Transportation (Medical): No  . Lack of Transportation (Non-Medical): No  Physical Activity: Not on file  Stress: Not on file  Social Connections: Not on file  Intimate Partner Violence: Not on file    Past Medical History, Surgical history, Social history, and Family history were reviewed and updated as appropriate.   Please see review of systems for further details on the patient's review from today.   Review of Systems:  Review of Systems  Constitutional: Negative for chills, diaphoresis and fever.  HENT: Negative for trouble swallowing and voice change.   Respiratory: Negative for cough, chest tightness,  shortness of breath and wheezing.   Cardiovascular: Negative for chest pain and palpitations.  Gastrointestinal: Negative for abdominal pain, constipation, diarrhea, nausea and vomiting.  Musculoskeletal: Negative for back pain and myalgias.  Neurological: Negative for dizziness, light-headedness and headaches.    Objective:   Physical Exam:  LMP 10/15/2017 (Approximate)  ECOG: 0  Physical Exam Constitutional:      General: She is not in acute distress.    Appearance: She is not diaphoretic.  HENT:     Head: Normocephalic and atraumatic.  Eyes:     General: No scleral icterus.  Right eye: No discharge.        Left eye: No discharge.     Conjunctiva/sclera: Conjunctivae normal.  Cardiovascular:     Rate and Rhythm: Normal rate and regular rhythm.     Heart sounds: Normal heart sounds. No murmur heard. No friction rub. No gallop.   Pulmonary:     Effort: Pulmonary effort is normal. No respiratory distress.     Breath sounds: Normal breath sounds. No wheezing or rales.  Skin:    General: Skin is warm and dry.     Findings: No erythema or rash.  Neurological:     Mental Status: She is alert.     Lab Review:     Component Value Date/Time   NA 141 01/14/2021 1355   K 3.8 01/14/2021 1355   CL 106 01/14/2021 1355   CO2 26 01/14/2021 1355   GLUCOSE 91 01/14/2021 1355   BUN 12 01/14/2021 1355   CREATININE 0.71 01/14/2021 1355   CREATININE 0.68 09/24/2017 0827   CALCIUM 9.2 01/14/2021 1355   PROT 6.7 01/14/2021 1355   ALBUMIN 3.8 01/14/2021 1355   AST 20 01/14/2021 1355   ALT 20 01/14/2021 1355   ALKPHOS 63 01/14/2021 1355   BILITOT 0.3 01/14/2021 1355   GFRNONAA >60 01/14/2021 1355   GFRNONAA >89 09/22/2016 1501   GFRAA >60 09/20/2020 1415   GFRAA >89 09/22/2016 1501       Component Value Date/Time   WBC 4.3 01/14/2021 1355   WBC 6.4 10/30/2020 1200   RBC 3.73 (L) 01/14/2021 1355   HGB 10.9 (L) 01/14/2021 1355   HCT 33.9 (L) 01/14/2021 1355   PLT 188  01/14/2021 1355   MCV 90.9 01/14/2021 1355   MCH 29.2 01/14/2021 1355   MCHC 32.2 01/14/2021 1355   RDW 13.7 01/14/2021 1355   LYMPHSABS 2.4 01/14/2021 1355   MONOABS 0.7 01/14/2021 1355   EOSABS 0.1 01/14/2021 1355   BASOSABS 0.0 01/14/2021 1355   -------------------------------  Imaging from last 24 hours (if applicable):  Radiology interpretation: DG Chest 2 View  Result Date: 01/14/2021 CLINICAL DATA:  Cough and congestion for several weeks, history of breast carcinoma on the right EXAM: CHEST - 2 VIEW COMPARISON:  10/30/2020 FINDINGS: Cardiac shadow is stable. Left-sided chest wall port is again seen and stable. No focal infiltrate or sizable effusion is seen. No sizable parenchymal nodules are noted. No acute bony abnormality is seen. IMPRESSION: No active cardiopulmonary disease. Electronically Signed   By: Inez Catalina M.D.   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 LABS:  None performed today EXAM: BILATERAL BREAST MRI WITH AND WITHOUT CONTRAST TECHNIQUE: Multiplanar, multisequence MR images of both breasts were obtained prior to and following the intravenous administration of 6 ml of Gadavist Three-dimensional MR images were rendered by post-processing of the original MR data on an independent workstation. The three-dimensional MR images were interpreted, and findings are reported in the following complete MRI report for this study. Three dimensional images were evaluated at the independent interpreting workstation using the DynaCAD thin client. COMPARISON:  09/08/2020 MR. Prior mammograms and ultrasounds. FINDINGS: Breast composition: c. Heterogeneous fibroglandular tissue. Background parenchymal enhancement: Mild Right breast: Biopsy clip artifact within the posterior UPPER INNER RIGHT breast is  identified with minimal residual enhancement measuring 1 cm without discernible mass. Biopsy clip within the UPPER  INNER RIGHT breast at the site of biopsy-proven fibrocystic changes is noted. No new abnormal areas of enhancement are noted. Left breast: No mass or abnormal enhancement. Port-A-Cath within the UPPER INNER LEFT breast is noted. Lymph nodes: No abnormal appearing lymph nodes. Ancillary findings:  None. IMPRESSION: 1. Treatment response with only 1 cm area of minimal residual enhancement (without discrete mass) at the site of known invasive ductal carcinoma, which measured 3 x 2.2 x 3 cm on 09/08/2020 MR. 2. No new or suspicious areas of enhancement otherwise within either breast. 3. No abnormal appearing lymph nodes. RECOMMENDATION: Treatment plan BI-RADS CATEGORY  6: Known biopsy-proven malignancy. Electronically Signed   By: Margarette Canada M.D.   On: 01/13/2021 15:05   ECHOCARDIOGRAM COMPLETE  Result Date: 01/28/2021    ECHOCARDIOGRAM REPORT   Patient Name:   Amanda Rich Date of Exam: 01/28/2021 Medical Rec #:  [id number]         Height:       65.0 in Accession #:    [id number]        Weight:       127.0 lb Date of Birth:  03-Jun-1966         BSA:          1.631 m Patient Age:    68 years          BP:           112/72 mmHg Patient Gender: F                 HR:           79 bpm. Exam Location:  Outpatient Procedure: 2D Echo, 3D Echo, Cardiac Doppler, Color Doppler and Strain Analysis Indications:    Z51.11 Encounter for antineoplastic chemotheraphy  History:        Patient has prior history of Echocardiogram examinations, most                 recent 09/06/2020. Signs/Symptoms:Chest Pain; Risk                 Factors:Current Smoker and Dyslipidemia. Breast cancer.                 Pericarditis.  Sonographer:    Roseanna Rainbow Referring Phys: [phone] LINDSEY CORNETTO CAUSEY  Sonographer Comments: Global longitudinal strain was attempted. IMPRESSIONS  1. Left ventricular ejection fraction, by estimation, is 60 to  65%. The left ventricle has normal function. The left ventricle has no regional wall motion abnormalities. Left ventricular diastolic parameters were normal. The average left ventricular global longitudinal strain is -17.3 %. The global longitudinal strain is normal.  2. Right ventricular systolic function is normal. The right ventricular size is normal. There is normal pulmonary artery systolic pressure.  3. The mitral valve is normal in structure. No evidence of mitral valve regurgitation. No evidence of mitral stenosis.  4. The aortic valve is normal in structure. Aortic valve regurgitation is not visualized. No aortic stenosis is present.  5. The inferior vena cava is normal in size with greater than 50% respiratory variability, suggesting right atrial pressure of 3 mmHg. FINDINGS  Left Ventricle: Left ventricular ejection fraction, by estimation, is 60 to 65%. The left ventricle has normal function. The left ventricle has no regional wall motion abnormalities. The average left ventricular global longitudinal strain is -17.3 %. The global longitudinal strain is normal. The left ventricular internal cavity size was normal in size. There is no left ventricular hypertrophy. Left  ventricular diastolic parameters were normal. Normal left ventricular filling pressure. Right Ventricle: The right ventricular size is normal. No increase in right ventricular wall thickness. Right ventricular systolic function is normal. There is normal pulmonary artery systolic pressure. The tricuspid regurgitant velocity is 1.89 m/s, and  with an assumed right atrial pressure of 3 mmHg, the estimated right ventricular systolic pressure is 35.5 mmHg. Left Atrium: Left atrial size was normal in size. Right Atrium: Right atrial size was normal in size. Pericardium: There is no evidence of pericardial effusion. Mitral Valve: The mitral valve is normal in structure. No evidence of mitral valve regurgitation. No evidence of mitral valve  stenosis. Tricuspid Valve: The tricuspid valve is normal in structure. Tricuspid valve regurgitation is trivial. No evidence of tricuspid stenosis. Aortic Valve: The aortic valve is normal in structure. Aortic valve regurgitation is not visualized. No aortic stenosis is present. Pulmonic Valve: The pulmonic valve was normal in structure. Pulmonic valve regurgitation is not visualized. No evidence of pulmonic stenosis. Aorta: The aortic root is normal in size and structure. Venous: The inferior vena cava is normal in size with greater than 50% respiratory variability, suggesting right atrial pressure of 3 mmHg. IAS/Shunts: No atrial level shunt detected by color flow Doppler.  LEFT VENTRICLE PLAX 2D LVIDd:         4.20 cm     Diastology LVIDs:         3.00 cm     LV e' medial:    13.80 cm/s LV PW:         1.10 cm     LV E/e' medial:  5.8 LV IVS:        0.90 cm     LV e' lateral:   12.50 cm/s LVOT diam:     1.70 cm     LV E/e' lateral: 6.4 LV SV:         39 LV SV Index:   24          2D Longitudinal Strain LVOT Area:     2.27 cm    2D Strain GLS Avg:     -17.3 %  LV Volumes (MOD) LV vol d, MOD A2C: 68.5 ml LV vol d, MOD A4C: 54.9 ml LV vol s, MOD A2C: 28.0 ml LV vol s, MOD A4C: 24.9 ml LV SV MOD A2C:     40.5 ml LV SV MOD A4C:     54.9 ml LV SV MOD BP:      37.4 ml RIGHT VENTRICLE             IVC RV S prime:     10.20 cm/s  IVC diam: 1.30 cm TAPSE (M-mode): 2.1 cm LEFT ATRIUM             Index       RIGHT ATRIUM          Index LA diam:        2.00 cm 1.23 cm/m  RA Area:     9.98 cm LA Vol (A2C):   16.7 ml 10.24 ml/m RA Volume:   21.30 ml 13.06 ml/m LA Vol (A4C):   13.2 ml 8.09 ml/m LA Biplane Vol: 15.2 ml 9.32 ml/m  AORTIC VALVE LVOT Vmax:   93.50 cm/s LVOT Vmean:  71.800 cm/s LVOT VTI:    0.171 m  AORTA Ao Root diam: 3.10 cm Ao Asc diam:  3.20 cm MITRAL VALVE  TRICUSPID VALVE MV Area (PHT): 5.38 cm    TR Peak grad:   14.3 mmHg MV Decel Time: 141 msec    TR Vmax:        189.00 cm/s MV E velocity:  80.60 cm/s MV A velocity: 53.30 cm/s  SHUNTS MV E/A ratio:  1.51        Systemic VTI:  0.17 m                            Systemic Diam: 1.70 cm Dani Gobble Croitoru MD Electronically signed by Sanda Klein MD Signature Date/Time: 01/28/2021/2:59:42 PM    Final

## 2021-02-01 ENCOUNTER — Other Ambulatory Visit: Payer: Self-pay | Admitting: Surgery

## 2021-02-01 ENCOUNTER — Ambulatory Visit
Admission: RE | Admit: 2021-02-01 | Discharge: 2021-02-01 | Disposition: A | Discharge status: Home / Self Care | Payer: Medicaid Other | Source: Ambulatory Visit | Attending: Surgery | Admitting: Surgery

## 2021-02-01 DIAGNOSIS — C50111 Malignant neoplasm of central portion of right female breast: Secondary | ICD-10-CM

## 2021-02-01 DIAGNOSIS — C50911 Malignant neoplasm of unspecified site of right female breast: Secondary | ICD-10-CM | POA: Diagnosis not present

## 2021-02-01 IMAGING — MG MM BREAST LOCALIZATION CLIP
8 series · 9 of 24 positions shown · non-contrast
Comparison: Previous exam(s).

CLINICAL DATA: Evaluate radioactive seed placement following
ultrasound-guided placement to localize a right breast carcinoma
prior to surgery.

EXAM:
DIAGNOSTIC RIGHT MAMMOGRAM POST ULTRASOUND GUIDED PLACEMENT OF A
RADIOACTIVE SEED.

[R CC synth-2D (1 of 3)]
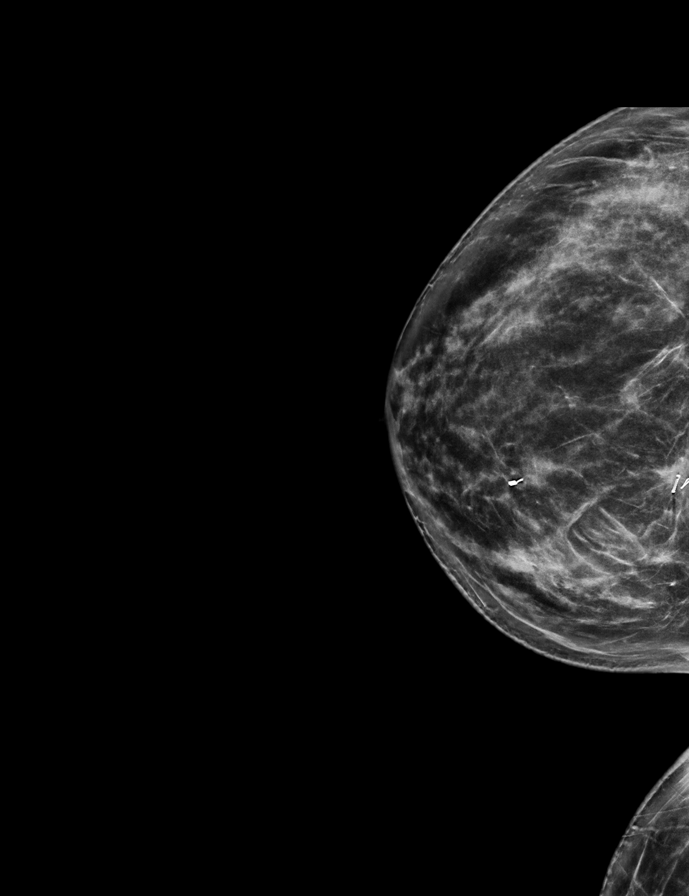

[R CC synth-2D (2 of 3)]
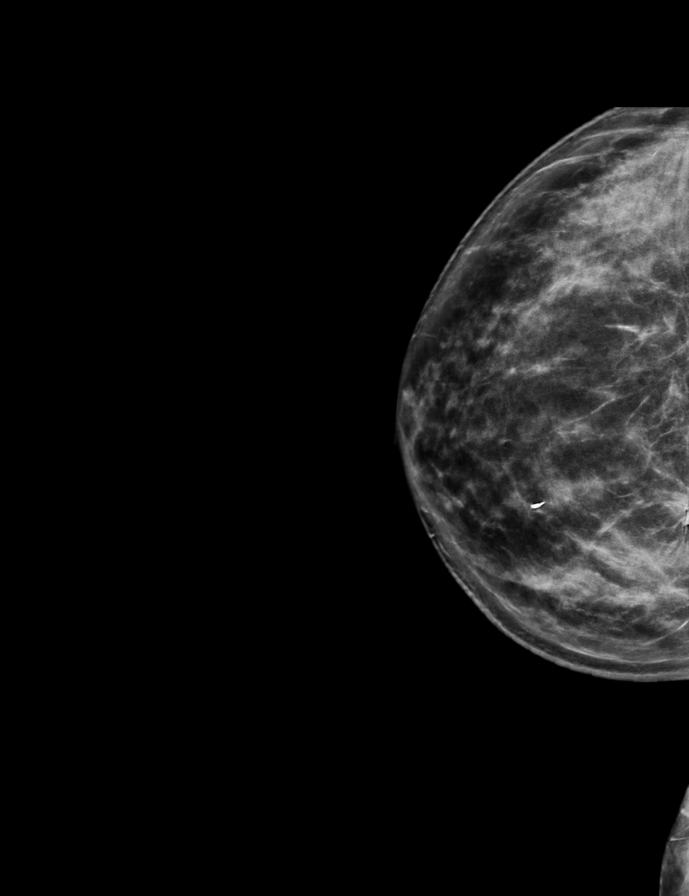

[R CC synth-2D (3 of 3)]
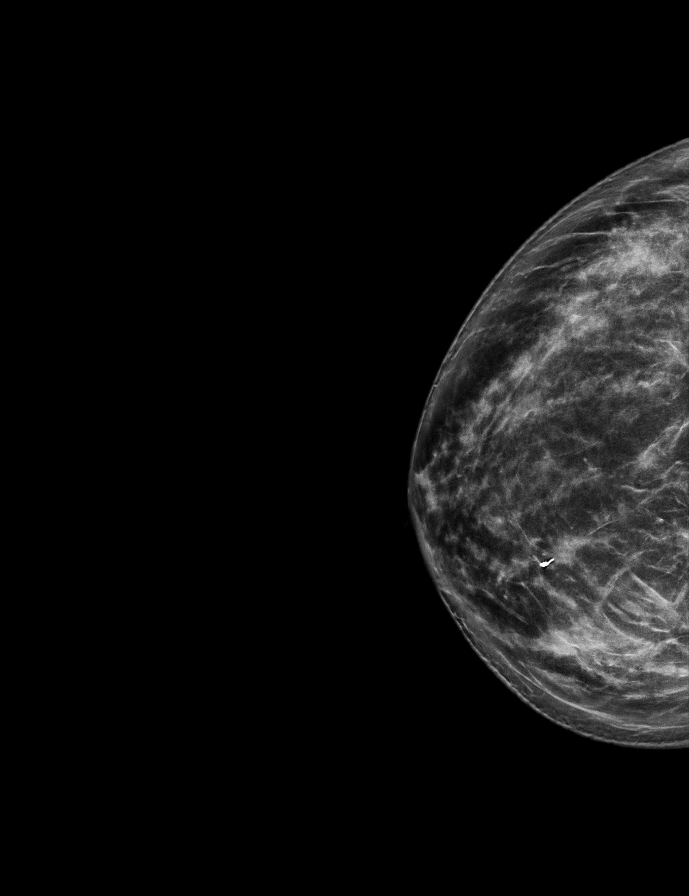

[R MLO synth-2D]
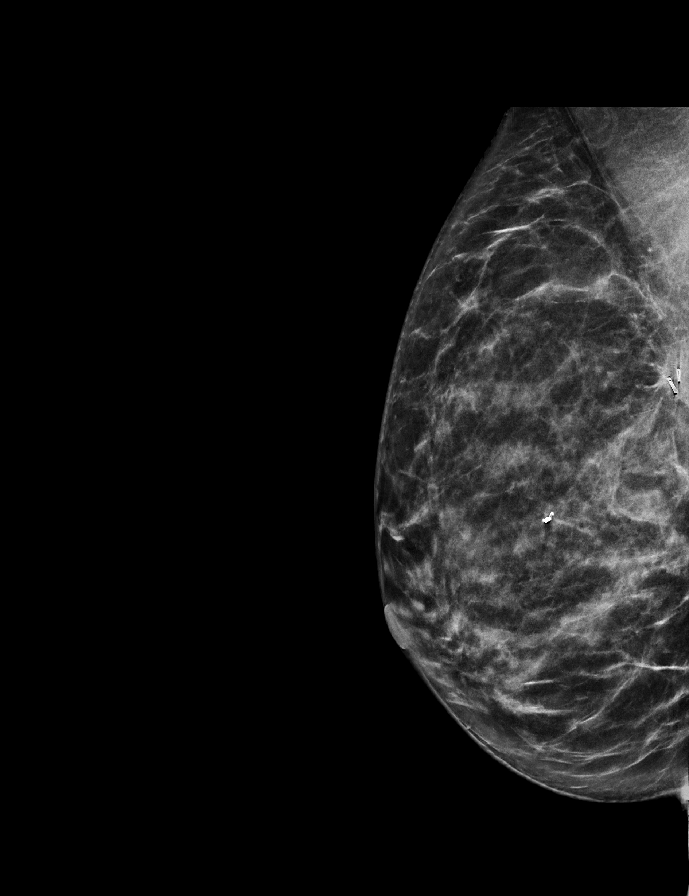

[R CC tomo · 2 of 69 frames shown (1 of 3)]
[frame 23/69]
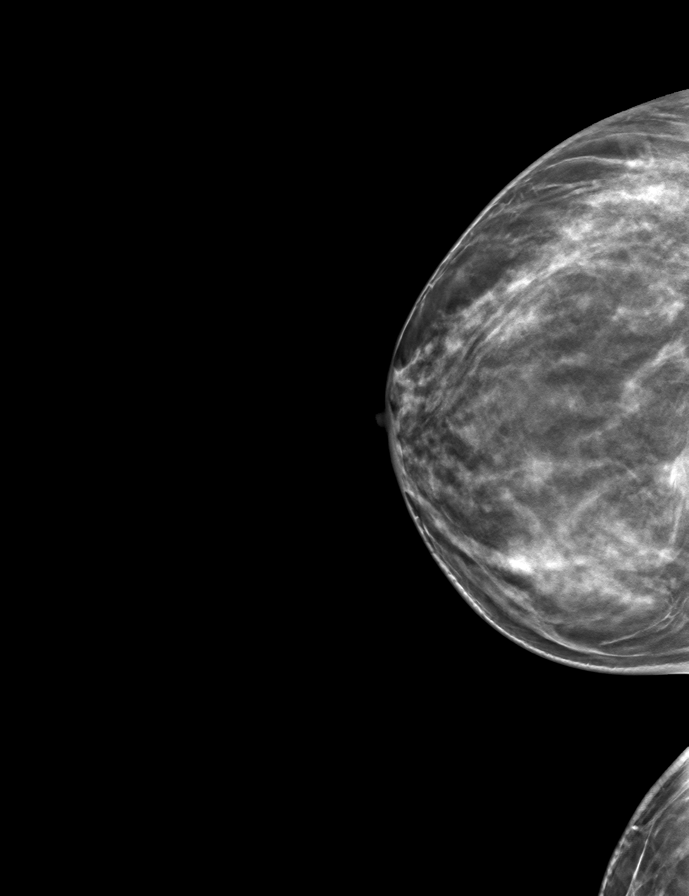
[frame 35/69]
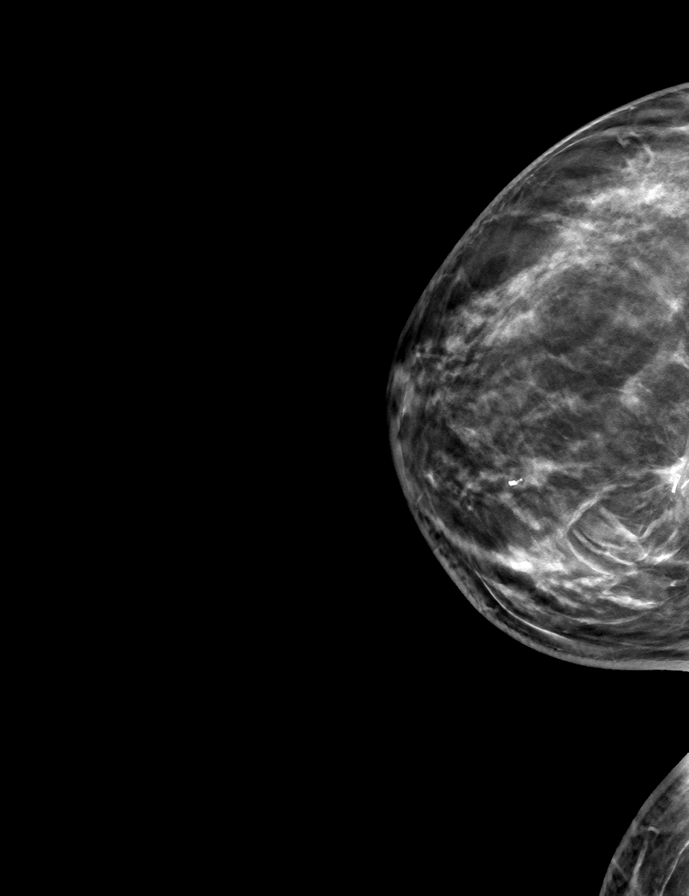

[R CC tomo (2 of 3) · tomo slice 35/70.0]
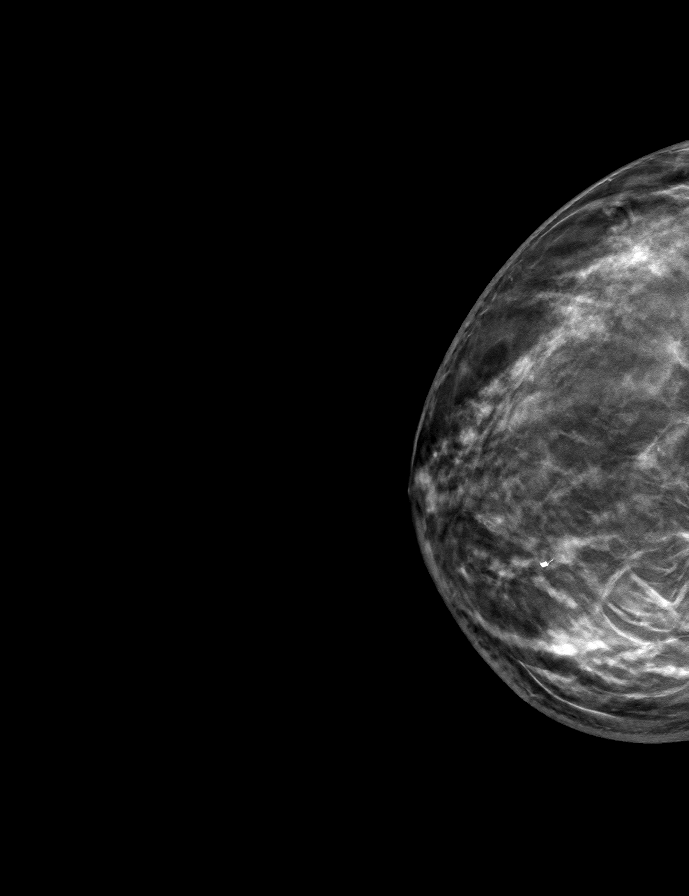

[R MLO tomo · tomo slice 31/62.0]
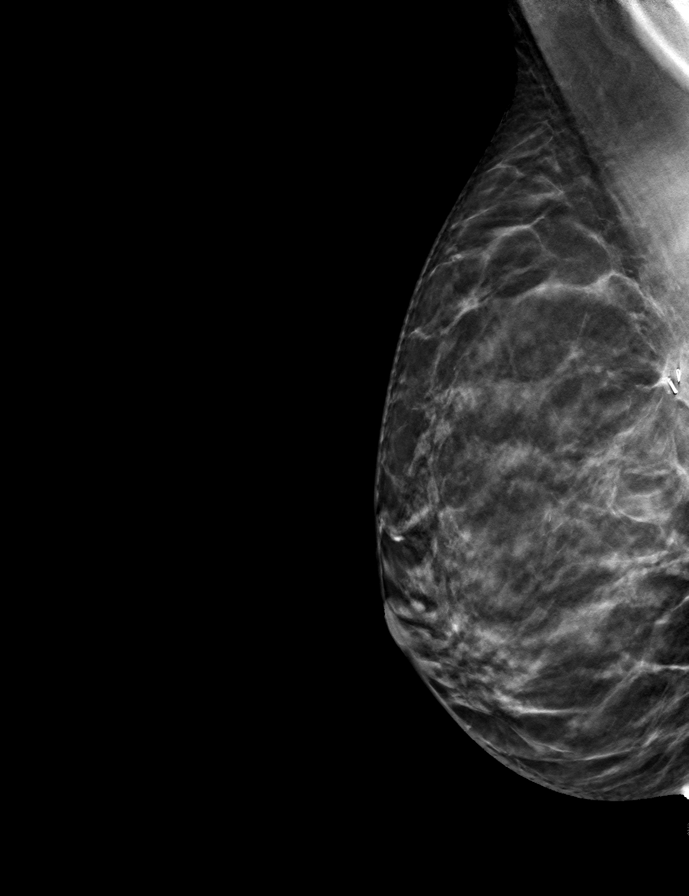

[R CC tomo (3 of 3) · tomo slice 37/73.0]
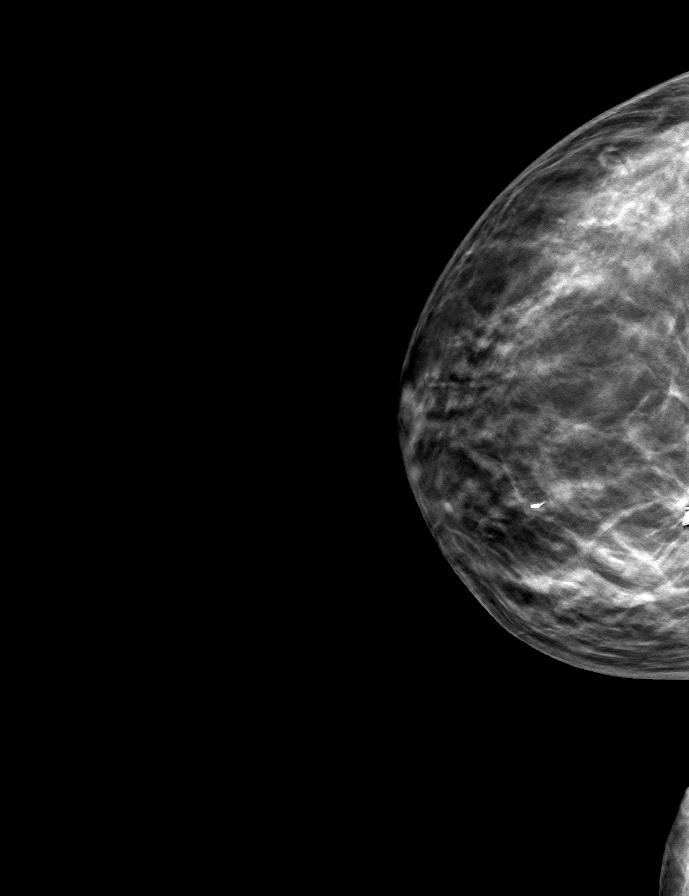

[9 of 24 positions shown; findings below may reference images not displayed]

FINDINGS: Mammographic images were obtained following ultrasound guided
placement of a radioactive seed in the right breast. The radioactive
seed lies along the anterior margin of the residual right breast
mass, directly adjacent to the ribbon shaped biopsy clip, in the
posterior upper inner right breast.
IMPRESSION: Well-positioned radioactive seed within the residual carcinoma in
the posterior upper inner right breast directly adjacent to the
ribbon shaped biopsy clip. There has been a significant interval
response to neoadjuvant chemotherapy with a decrease in size of the
right breast mass.

Final Assessment: Post Procedure Mammograms for Marker Placement

## 2021-02-02 ENCOUNTER — Encounter (HOSPITAL_BASED_OUTPATIENT_CLINIC_OR_DEPARTMENT_OTHER)
Admission: RE | Disposition: A | Discharge status: Home / Self Care | Payer: Self-pay | Source: Ambulatory Visit | Attending: Surgery

## 2021-02-02 ENCOUNTER — Ambulatory Visit (HOSPITAL_BASED_OUTPATIENT_CLINIC_OR_DEPARTMENT_OTHER): Payer: Medicaid Other | Admitting: Anesthesiology

## 2021-02-02 ENCOUNTER — Ambulatory Visit (HOSPITAL_BASED_OUTPATIENT_CLINIC_OR_DEPARTMENT_OTHER)
Admission: RE | Admit: 2021-02-02 | Discharge: 2021-02-02 | Disposition: A | Discharge status: Home / Self Care | Payer: Medicaid Other | Source: Ambulatory Visit | Attending: Surgery | Admitting: Surgery

## 2021-02-02 ENCOUNTER — Ambulatory Visit
Admission: RE | Admit: 2021-02-02 | Discharge: 2021-02-02 | Disposition: A | Discharge status: Home / Self Care | Payer: Medicaid Other | Source: Ambulatory Visit | Attending: Surgery | Admitting: Surgery

## 2021-02-02 ENCOUNTER — Encounter (HOSPITAL_BASED_OUTPATIENT_CLINIC_OR_DEPARTMENT_OTHER): Payer: Self-pay | Admitting: Surgery

## 2021-02-02 ENCOUNTER — Other Ambulatory Visit: Payer: Self-pay

## 2021-02-02 ENCOUNTER — Other Ambulatory Visit (HOSPITAL_BASED_OUTPATIENT_CLINIC_OR_DEPARTMENT_OTHER): Payer: Self-pay | Admitting: Surgery

## 2021-02-02 ENCOUNTER — Ambulatory Visit (HOSPITAL_COMMUNITY)
Admission: RE | Admit: 2021-02-02 | Discharge: 2021-02-02 | Disposition: A | Discharge status: Home / Self Care | Payer: Medicaid Other | Source: Ambulatory Visit | Attending: Surgery | Admitting: Surgery

## 2021-02-02 ENCOUNTER — Ambulatory Visit: Payer: Medicaid Other

## 2021-02-02 DIAGNOSIS — Z7951 Long term (current) use of inhaled steroids: Secondary | ICD-10-CM | POA: Insufficient documentation

## 2021-02-02 DIAGNOSIS — G8918 Other acute postprocedural pain: Secondary | ICD-10-CM | POA: Diagnosis not present

## 2021-02-02 DIAGNOSIS — I252 Old myocardial infarction: Secondary | ICD-10-CM | POA: Insufficient documentation

## 2021-02-02 DIAGNOSIS — C50911 Malignant neoplasm of unspecified site of right female breast: Secondary | ICD-10-CM | POA: Diagnosis not present

## 2021-02-02 DIAGNOSIS — C50411 Malignant neoplasm of upper-outer quadrant of right female breast: Secondary | ICD-10-CM | POA: Diagnosis not present

## 2021-02-02 DIAGNOSIS — Z79899 Other long term (current) drug therapy: Secondary | ICD-10-CM | POA: Diagnosis not present

## 2021-02-02 DIAGNOSIS — Z9221 Personal history of antineoplastic chemotherapy: Secondary | ICD-10-CM | POA: Diagnosis not present

## 2021-02-02 DIAGNOSIS — E782 Mixed hyperlipidemia: Secondary | ICD-10-CM | POA: Diagnosis not present

## 2021-02-02 DIAGNOSIS — Z170 Estrogen receptor positive status [ER+]: Secondary | ICD-10-CM | POA: Insufficient documentation

## 2021-02-02 DIAGNOSIS — C50111 Malignant neoplasm of central portion of right female breast: Secondary | ICD-10-CM

## 2021-02-02 DIAGNOSIS — Z881 Allergy status to other antibiotic agents status: Secondary | ICD-10-CM | POA: Insufficient documentation

## 2021-02-02 DIAGNOSIS — C50211 Malignant neoplasm of upper-inner quadrant of right female breast: Secondary | ICD-10-CM | POA: Diagnosis not present

## 2021-02-02 DIAGNOSIS — F17200 Nicotine dependence, unspecified, uncomplicated: Secondary | ICD-10-CM | POA: Diagnosis not present

## 2021-02-02 DIAGNOSIS — Z841 Family history of disorders of kidney and ureter: Secondary | ICD-10-CM | POA: Insufficient documentation

## 2021-02-02 DIAGNOSIS — Z833 Family history of diabetes mellitus: Secondary | ICD-10-CM | POA: Insufficient documentation

## 2021-02-02 DIAGNOSIS — Z809 Family history of malignant neoplasm, unspecified: Secondary | ICD-10-CM | POA: Insufficient documentation

## 2021-02-02 DIAGNOSIS — Z832 Family history of diseases of the blood and blood-forming organs and certain disorders involving the immune mechanism: Secondary | ICD-10-CM | POA: Diagnosis not present

## 2021-02-02 DIAGNOSIS — Z8249 Family history of ischemic heart disease and other diseases of the circulatory system: Secondary | ICD-10-CM | POA: Diagnosis not present

## 2021-02-02 HISTORY — PX: BREAST LUMPECTOMY WITH RADIOACTIVE SEED AND SENTINEL LYMPH NODE BIOPSY: SHX6550

## 2021-02-02 HISTORY — PX: BREAST LUMPECTOMY: SHX2

## 2021-02-02 HISTORY — DX: Disease of pericardium, unspecified: I31.9

## 2021-02-02 HISTORY — DX: Malignant (primary) neoplasm, unspecified: C80.1

## 2021-02-02 IMAGING — MG MM BREAST SURGICAL SPECIMEN
1 series · 2 of 2 positions shown · non-contrast
Comparison: Previous exam(s).

CLINICAL DATA: Evaluate surgical specimen following excision of a
right breast carcinoma after radioactive seed localization.

EXAM:
SPECIMEN RADIOGRAPH OF THE RIGHT BREAST

[Series 1: R · right · 0.07mm/px · 2 of 2 slices shown]
[im 1/2]
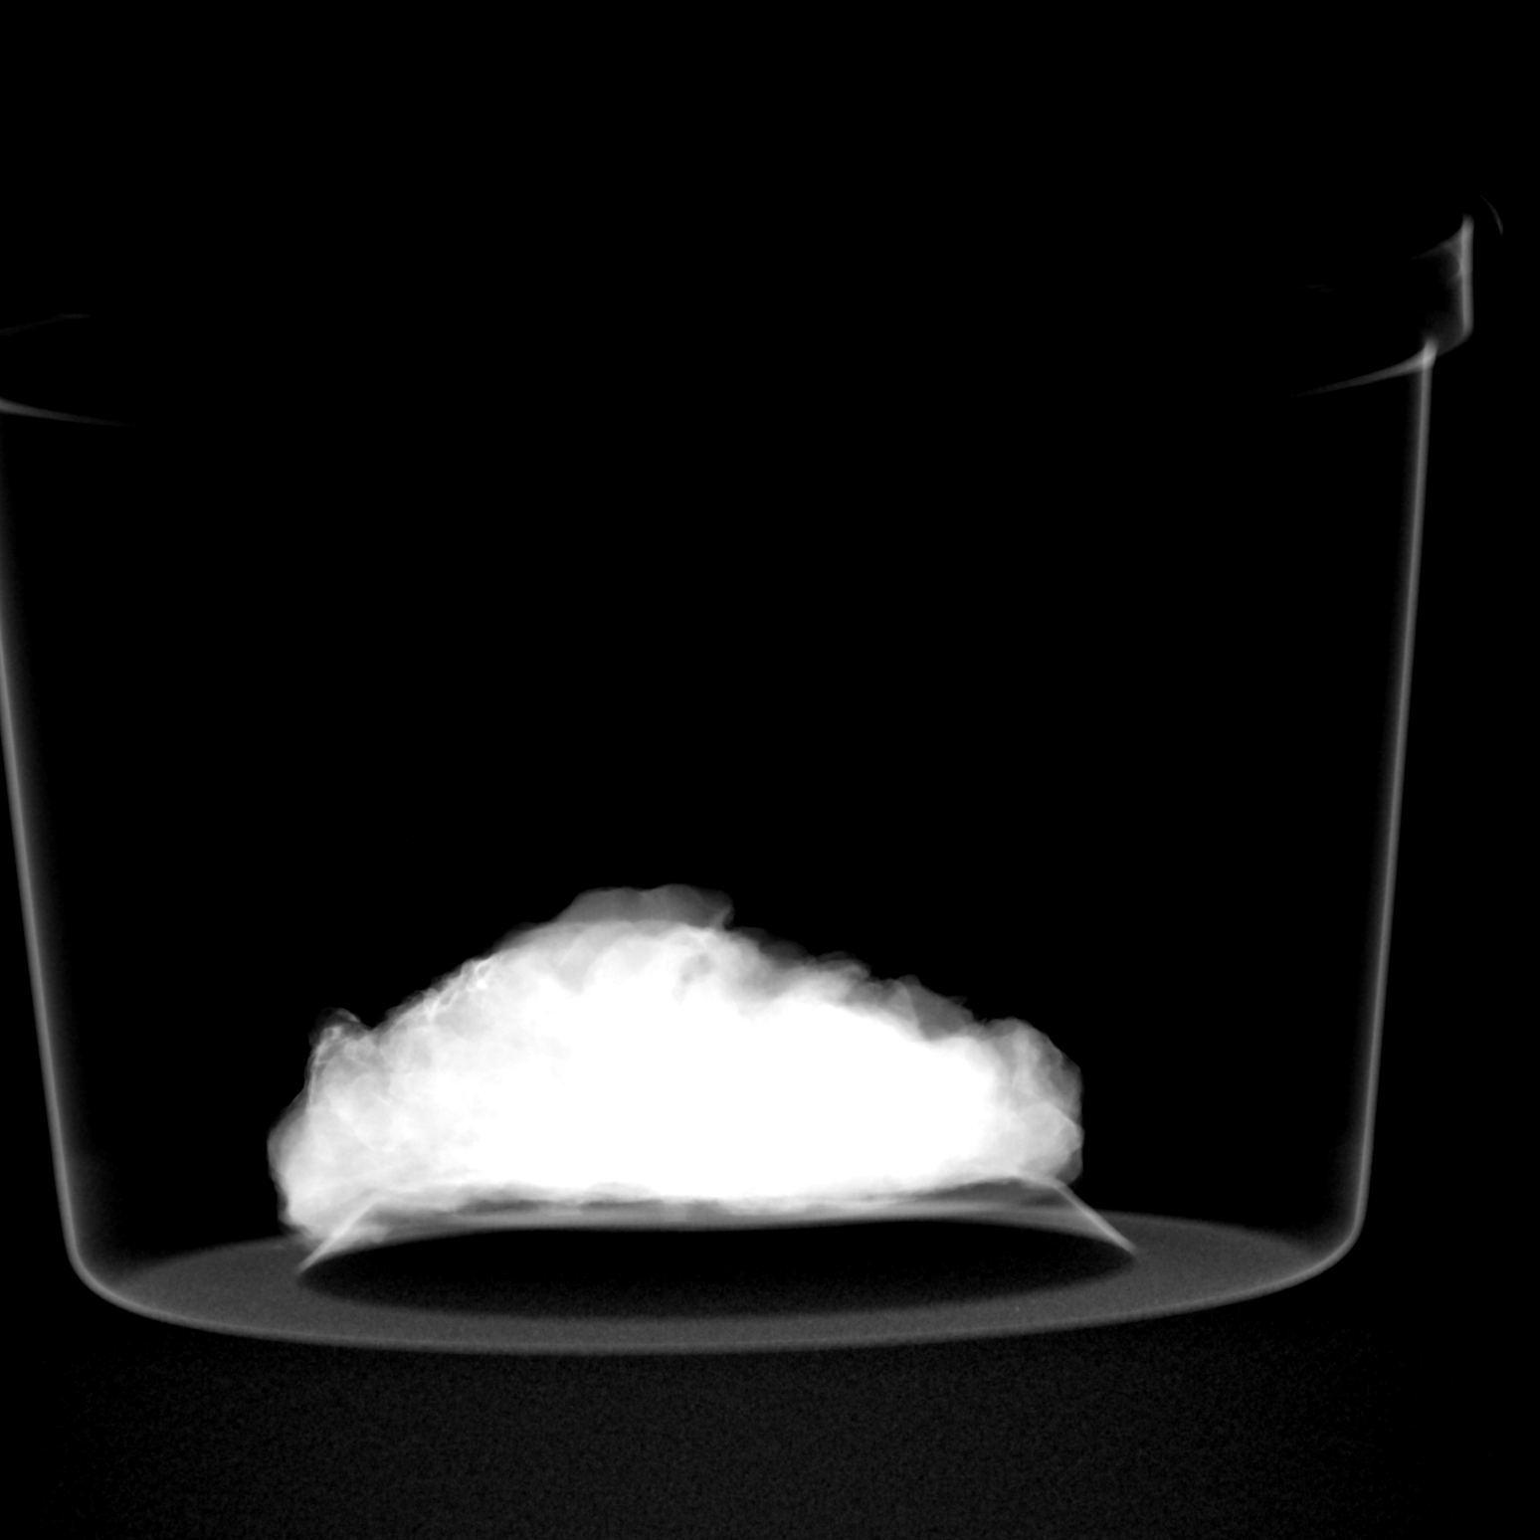
[im 2/2]
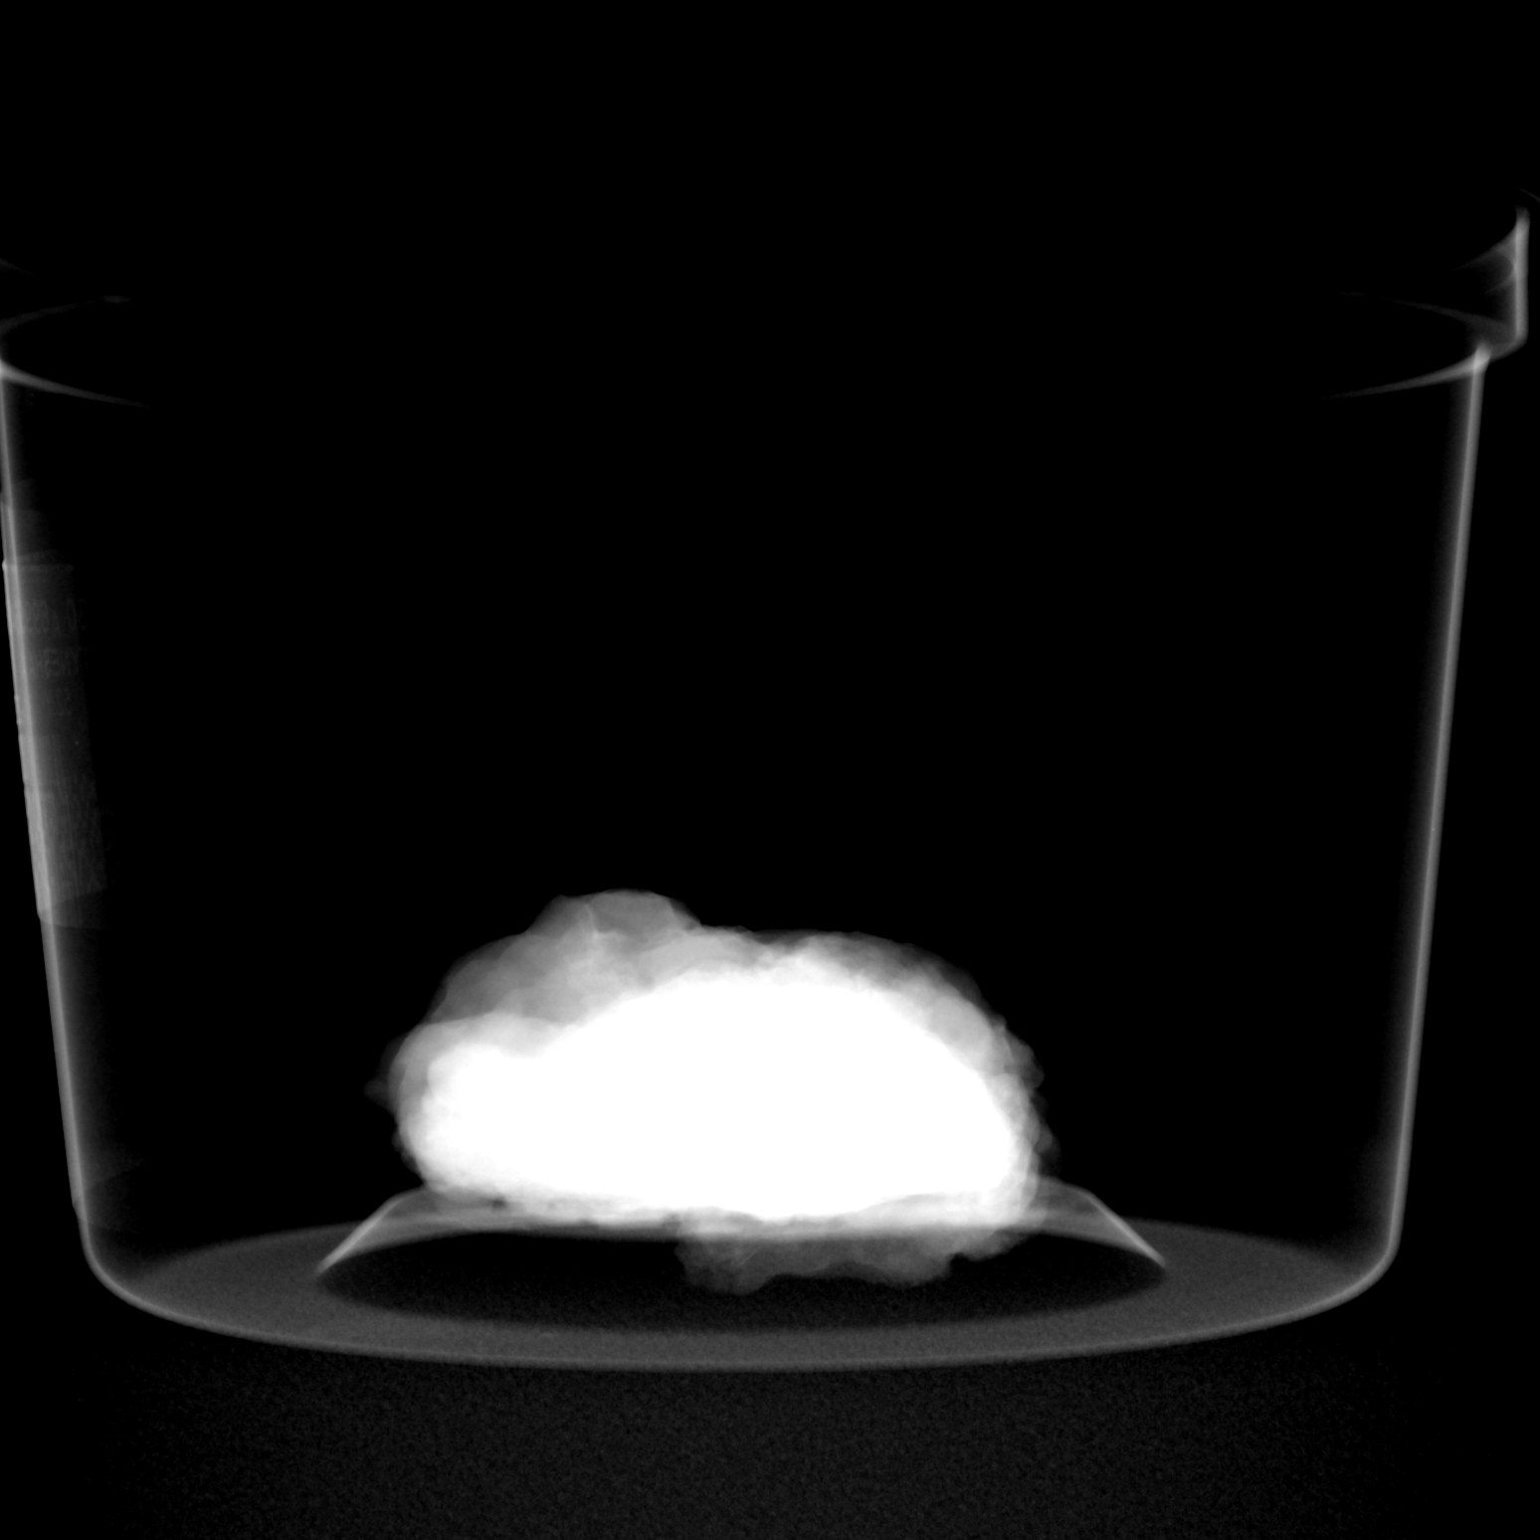

[2 of 2 positions shown; findings below may reference images not displayed]

FINDINGS: Status post excision of the right breast. The radioactive seed and
biopsy marker clip are present, completely intact, and were marked
for pathology.
IMPRESSION: Specimen radiograph of the right breast.

## 2021-02-02 SURGERY — BREAST LUMPECTOMY WITH RADIOACTIVE SEED AND SENTINEL LYMPH NODE BIOPSY
Anesthesia: General | Site: Breast | Laterality: Right

## 2021-02-02 MED ORDER — PHENYLEPHRINE 40 MCG/ML (10ML) SYRINGE FOR IV PUSH (FOR BLOOD PRESSURE SUPPORT)
PREFILLED_SYRINGE | INTRAVENOUS | Status: AC
  Filled 2021-02-02: qty 30

## 2021-02-02 MED ORDER — GABAPENTIN 300 MG PO CAPS
300.0000 mg | ORAL_CAPSULE | ORAL | Status: AC
  Administered 2021-02-02: 300 mg via ORAL

## 2021-02-02 MED ORDER — FENTANYL CITRATE (PF) 100 MCG/2ML IJ SOLN
25.0000 ug | INTRAMUSCULAR | Status: DC | PRN
Start: 2021-02-02 — End: 2021-02-02
  Administered 2021-02-02 (×2): 25 ug via INTRAVENOUS

## 2021-02-02 MED ORDER — CHLORHEXIDINE GLUCONATE CLOTH 2 % EX PADS: 6.0000 | MEDICATED_PAD | Freq: Once | CUTANEOUS | Status: DC

## 2021-02-02 MED ORDER — CEFAZOLIN SODIUM-DEXTROSE 2-4 GM/100ML-% IV SOLN
INTRAVENOUS | Status: AC
  Filled 2021-02-02: qty 100

## 2021-02-02 MED ORDER — LACTATED RINGERS IV SOLN: INTRAVENOUS | Status: DC

## 2021-02-02 MED ORDER — PROPOFOL 500 MG/50ML IV EMUL
INTRAVENOUS | Status: DC | PRN
  Administered 2021-02-02: 25 ug/kg/min via INTRAVENOUS

## 2021-02-02 MED ORDER — MIDAZOLAM HCL 2 MG/2ML IJ SOLN
INTRAMUSCULAR | Status: AC
  Filled 2021-02-02: qty 2

## 2021-02-02 MED ORDER — BUPIVACAINE LIPOSOME 1.3 % IJ SUSP
INTRAMUSCULAR | Status: DC | PRN
  Administered 2021-02-02: 10 mL via PERINEURAL

## 2021-02-02 MED ORDER — IBUPROFEN 800 MG PO TABS: 800.0000 mg | ORAL_TABLET | Freq: Three times a day (TID) | ORAL | 0 refills | Status: DC | PRN

## 2021-02-02 MED ORDER — FENTANYL CITRATE (PF) 100 MCG/2ML IJ SOLN
INTRAMUSCULAR | Status: AC
  Filled 2021-02-02: qty 2

## 2021-02-02 MED ORDER — SODIUM CHLORIDE (PF) 0.9 % IJ SOLN
INTRAMUSCULAR | Status: AC
  Filled 2021-02-02: qty 10

## 2021-02-02 MED ORDER — ACETAMINOPHEN 500 MG PO TABS
1000.0000 mg | ORAL_TABLET | ORAL | Status: AC
  Administered 2021-02-02: 1000 mg via ORAL

## 2021-02-02 MED ORDER — BUPIVACAINE HCL (PF) 0.25 % IJ SOLN
INTRAMUSCULAR | Status: DC | PRN
  Administered 2021-02-02: 21 mL

## 2021-02-02 MED ORDER — FENTANYL CITRATE (PF) 100 MCG/2ML IJ SOLN
INTRAMUSCULAR | Status: DC | PRN
  Administered 2021-02-02 (×3): 25 ug via INTRAVENOUS

## 2021-02-02 MED ORDER — CEFAZOLIN SODIUM-DEXTROSE 2-4 GM/100ML-% IV SOLN
2.0000 g | INTRAVENOUS | Status: AC
  Administered 2021-02-02: 2 g via INTRAVENOUS

## 2021-02-02 MED ORDER — ONDANSETRON HCL 4 MG/2ML IJ SOLN: 4.0000 mg | Freq: Once | INTRAMUSCULAR | Status: DC | PRN

## 2021-02-02 MED ORDER — LIDOCAINE 2% (20 MG/ML) 5 ML SYRINGE
INTRAMUSCULAR | Status: DC | PRN
  Administered 2021-02-02: 30 mg via INTRAVENOUS

## 2021-02-02 MED ORDER — CELECOXIB 200 MG PO CAPS
200.0000 mg | ORAL_CAPSULE | ORAL | Status: AC
  Administered 2021-02-02: 200 mg via ORAL

## 2021-02-02 MED ORDER — DEXAMETHASONE SODIUM PHOSPHATE 4 MG/ML IJ SOLN
INTRAMUSCULAR | Status: DC | PRN
  Administered 2021-02-02: 4 mg via INTRAVENOUS

## 2021-02-02 MED ORDER — OXYCODONE HCL 5 MG PO TABS
ORAL_TABLET | ORAL | Status: AC
  Filled 2021-02-02: qty 1

## 2021-02-02 MED ORDER — METHYLENE BLUE 0.5 % INJ SOLN
INTRAVENOUS | Status: AC
  Filled 2021-02-02: qty 10

## 2021-02-02 MED ORDER — LIDOCAINE 2% (20 MG/ML) 5 ML SYRINGE
INTRAMUSCULAR | Status: AC
  Filled 2021-02-02: qty 15

## 2021-02-02 MED ORDER — PROPOFOL 10 MG/ML IV BOLUS
INTRAVENOUS | Status: DC | PRN
  Administered 2021-02-02: 100 mg via INTRAVENOUS

## 2021-02-02 MED ORDER — 0.9 % SODIUM CHLORIDE (POUR BTL) OPTIME
TOPICAL | Status: DC | PRN
  Administered 2021-02-02: 200 mL

## 2021-02-02 MED ORDER — CELECOXIB 200 MG PO CAPS
ORAL_CAPSULE | ORAL | Status: AC
  Filled 2021-02-02: qty 1

## 2021-02-02 MED ORDER — ACETAMINOPHEN 500 MG PO TABS
ORAL_TABLET | ORAL | Status: AC
  Filled 2021-02-02: qty 2

## 2021-02-02 MED ORDER — GABAPENTIN 300 MG PO CAPS
ORAL_CAPSULE | ORAL | Status: AC
  Filled 2021-02-02: qty 1

## 2021-02-02 MED ORDER — HYDROCODONE-ACETAMINOPHEN 5-325 MG PO TABS: 1.0000 | ORAL_TABLET | Freq: Four times a day (QID) | ORAL | 0 refills | Status: DC | PRN

## 2021-02-02 MED ORDER — ONDANSETRON HCL 4 MG/2ML IJ SOLN
INTRAMUSCULAR | Status: AC
  Filled 2021-02-02: qty 12

## 2021-02-02 MED ORDER — OXYCODONE HCL 5 MG PO TABS
5.0000 mg | ORAL_TABLET | Freq: Once | ORAL | Status: AC | PRN
Start: 2021-02-02 — End: 2021-02-02
  Administered 2021-02-02: 5 mg via ORAL

## 2021-02-02 MED ORDER — EPHEDRINE 5 MG/ML INJ
INTRAVENOUS | Status: AC
  Filled 2021-02-02: qty 30

## 2021-02-02 MED ORDER — SODIUM CHLORIDE (PF) 0.9 % IJ SOLN
INTRAVENOUS | Status: DC | PRN
  Administered 2021-02-02: 5 mL via SUBCUTANEOUS

## 2021-02-02 MED ORDER — OXYCODONE HCL 5 MG/5ML PO SOLN
5.0000 mg | Freq: Once | ORAL | Status: AC | PRN
Start: 2021-02-02 — End: 2021-02-02

## 2021-02-02 MED ORDER — MIDAZOLAM HCL 2 MG/2ML IJ SOLN
2.0000 mg | Freq: Once | INTRAMUSCULAR | Status: AC
  Administered 2021-02-02: 2 mg via INTRAVENOUS

## 2021-02-02 MED ORDER — BUPIVACAINE HCL (PF) 0.25 % IJ SOLN
INTRAMUSCULAR | Status: AC
  Filled 2021-02-02: qty 30

## 2021-02-02 MED ORDER — ONDANSETRON HCL 4 MG/2ML IJ SOLN
INTRAMUSCULAR | Status: DC | PRN
  Administered 2021-02-02: 4 mg via INTRAVENOUS

## 2021-02-02 MED ORDER — DEXAMETHASONE SODIUM PHOSPHATE 10 MG/ML IJ SOLN
INTRAMUSCULAR | Status: AC
  Filled 2021-02-02: qty 2

## 2021-02-02 MED ORDER — TECHNETIUM TC 99M SULFUR COLLOID FILTERED
1.0000 | Freq: Once | INTRAVENOUS | Status: AC | PRN
  Administered 2021-02-02: 1 via INTRADERMAL

## 2021-02-02 MED ORDER — BUPIVACAINE-EPINEPHRINE (PF) 0.5% -1:200000 IJ SOLN
INTRAMUSCULAR | Status: DC | PRN
Start: 2021-02-02 — End: 2021-02-02
  Administered 2021-02-02: 20 mL via PERINEURAL

## 2021-02-02 MED ORDER — FENTANYL CITRATE (PF) 100 MCG/2ML IJ SOLN
100.0000 ug | Freq: Once | INTRAMUSCULAR | Status: AC
  Administered 2021-02-02: 50 ug via INTRAVENOUS

## 2021-02-02 SURGICAL SUPPLY — 54 items
ADH SKN CLS APL DERMABOND .7 (GAUZE/BANDAGES/DRESSINGS) ×1
APL PRP STRL LF DISP 70% ISPRP (MISCELLANEOUS) ×1
APPLIER CLIP 9.375 MED OPEN (MISCELLANEOUS) ×2
APR CLP MED 9.3 20 MLT OPN (MISCELLANEOUS) ×1
BINDER BREAST LRG (GAUZE/BANDAGES/DRESSINGS) ×1 IMPLANT
BINDER BREAST MEDIUM (GAUZE/BANDAGES/DRESSINGS) IMPLANT
BINDER BREAST XLRG (GAUZE/BANDAGES/DRESSINGS) IMPLANT
BINDER BREAST XXLRG (GAUZE/BANDAGES/DRESSINGS) IMPLANT
BLADE SURG 15 STRL LF DISP TIS (BLADE) ×1 IMPLANT
BLADE SURG 15 STRL SS (BLADE) ×2
CANISTER SUC SOCK COL 7IN (MISCELLANEOUS) IMPLANT
CANISTER SUCT 1200ML W/VALVE (MISCELLANEOUS) ×2 IMPLANT
CHLORAPREP W/TINT 26 (MISCELLANEOUS) ×2 IMPLANT
CLIP APPLIE 9.375 MED OPEN (MISCELLANEOUS) ×1 IMPLANT
COVER BACK TABLE 60X90IN (DRAPES) ×2 IMPLANT
COVER MAYO STAND STRL (DRAPES) ×2 IMPLANT
COVER PROBE W GEL 5X96 (DRAPES) ×2 IMPLANT
COVER WAND RF STERILE (DRAPES) IMPLANT
DECANTER SPIKE VIAL GLASS SM (MISCELLANEOUS) IMPLANT
DERMABOND ADVANCED (GAUZE/BANDAGES/DRESSINGS) ×1
DERMABOND ADVANCED .7 DNX12 (GAUZE/BANDAGES/DRESSINGS) ×1 IMPLANT
DRAPE LAPAROSCOPIC ABDOMINAL (DRAPES) ×2 IMPLANT
DRAPE UTILITY XL STRL (DRAPES) ×2 IMPLANT
ELECT COATED BLADE 2.86 ST (ELECTRODE) ×2 IMPLANT
ELECT REM PT RETURN 9FT ADLT (ELECTROSURGICAL) ×2
ELECTRODE REM PT RTRN 9FT ADLT (ELECTROSURGICAL) ×1 IMPLANT
GLOVE ECLIPSE 8.0 STRL XLNG CF (GLOVE) ×2 IMPLANT
GLOVE SRG 8 PF TXTR STRL LF DI (GLOVE) ×1 IMPLANT
GLOVE SURG SYN 7.0 (GLOVE) ×2 IMPLANT
GLOVE SURG SYN 7.0 PF PI (GLOVE) IMPLANT
GLOVE SURG UNDER POLY LF SZ7 (GLOVE) ×4 IMPLANT
GLOVE SURG UNDER POLY LF SZ8 (GLOVE) ×2
GOWN STRL REUS W/ TWL LRG LVL3 (GOWN DISPOSABLE) ×2 IMPLANT
GOWN STRL REUS W/ TWL XL LVL3 (GOWN DISPOSABLE) ×1 IMPLANT
GOWN STRL REUS W/TWL LRG LVL3 (GOWN DISPOSABLE) ×4
GOWN STRL REUS W/TWL XL LVL3 (GOWN DISPOSABLE) ×2
HEMOSTAT ARISTA ABSORB 3G PWDR (HEMOSTASIS) IMPLANT
HEMOSTAT SNOW SURGICEL 2X4 (HEMOSTASIS) IMPLANT
KIT MARKER MARGIN INK (KITS) ×2 IMPLANT
NDL SAFETY ECLIPSE 18X1.5 (NEEDLE) IMPLANT
NEEDLE HYPO 18GX1.5 SHARP (NEEDLE)
NEEDLE HYPO 25X1 1.5 SAFETY (NEEDLE) ×4 IMPLANT
NS IRRIG 1000ML POUR BTL (IV SOLUTION) ×2 IMPLANT
PACK BASIN DAY SURGERY FS (CUSTOM PROCEDURE TRAY) ×2 IMPLANT
PENCIL SMOKE EVACUATOR (MISCELLANEOUS) ×2 IMPLANT
SLEEVE SCD COMPRESS KNEE MED (MISCELLANEOUS) ×2 IMPLANT
SPONGE LAP 4X18 RFD (DISPOSABLE) ×2 IMPLANT
SUT MNCRL AB 4-0 PS2 18 (SUTURE) ×2 IMPLANT
SUT VICRYL 3-0 CR8 SH (SUTURE) ×2 IMPLANT
SYR CONTROL 10ML LL (SYRINGE) ×3 IMPLANT
TOWEL GREEN STERILE FF (TOWEL DISPOSABLE) ×2 IMPLANT
TRAY FAXITRON CT DISP (TRAY / TRAY PROCEDURE) ×1 IMPLANT
TUBE CONNECTING 20X1/4 (TUBING) ×2 IMPLANT
YANKAUER SUCT BULB TIP NO VENT (SUCTIONS) ×2 IMPLANT

## 2021-02-02 NOTE — Op Note (Signed)
Preoperative diagnosis: Right breast cancer status post neoadjuvant chemotherapy stage II  Postoperative diagnosis right breast cancer status post neoadjuvant chemotherapy stage II right upper outer quadrant  Procedure: Right breast seed localized lumpectomy with right axillary sentinel lymph node mapping using methylene blue dye  Surgeon: [name], MD  Anesthesia: LMA with pectoral block as well as local anesthetic of 0.25% Marcaine  EBL: 20 cc  Specimen: Right breast tissue with seed and clip verified by Faxitron plus additional medial inferior margin.  Deep margin is pectoralis major muscle and anterior margin skin +3 hot blue axillary level 1 sentinel lymph nodes  Drains: None  Indication for procedure: The patient presents after neoadjuvant chemotherapy for breast conserving surgery for stage II right breast cancer.  She was counseled on different surgical management techniques to include mastectomy with reconstruction as well as breast conserving surgery.  Long-term expectations, survival, complications and quality life issues were discussed.  She opted for breast conserving surgery after reviewing all of her options.The procedure has been discussed with the patient. Alternatives to surgery have been discussed with the patient.  Risks of surgery include bleeding,  Infection,  Seroma formation, death,  and the need for further surgery.   The patient understands and wishes to proceed. Sentinel lymph node mapping and dissection has been discussed with the patient.  Risk of bleeding,  Infection,  Seroma formation,  Additional procedures,,  Shoulder weakness ,  Shoulder stiffness,  Nerve and blood vessel injury and reaction to the mapping dyes have been discussed.  Alternatives to surgery have been discussed with the patient.  The patient agrees to proceed.    Description of procedure: The patient was met in the holding area and questions were answered.  She had seed placement done as an  outpatient.  She underwent pectoral block per anesthesia and injection of technetium sulfur colloid per radiology protocol.  Neoprobe was used to identify the seed in the right breast this was marked.  All questions were answered.  She was then taken back to the operating.  She is placed supine upon the OR table.  After induction of general esthesia, the right breast was prepped and draped in sterile fashion and timeout performed.  Proper patient, site and procedure were verified.  Her films were available for review in the operating room as well as reports.  4 cc of methylene blue dye were injected in a subareolar position and massaged for 5 minutes.  Neoprobe was used to identify the seed with help with films in the upper central breast.  Transverse incision was made.  All tissue around the seed and clip were excised with a grossly negative margin.  The Faxitron image revealed the seed and clip to be present but the medial margin and inferior margin looked close therefore this was taken as a separate specimen.  Hemostasis achieved.  The cavity was marked with clips.  Cavity closed with 3-0 Vicryl and 4 Monocryl after infiltration with local anesthetic.  The neoprobe settings were changed to technetium.  Hotspot identified in the right axilla.  A 3 cm incision was made and dissection was carried down into the level 1 lymph node basin.  They were 3 blue hot sentinel nodes present and these were excised.  Background counts approaches 0.  Hemostasis achieved with cautery.  Wound closed with 3-0 Vicryl and 4-0 Monocryl.  Dermabond applied to both.  All counts were found to be correct.  Breast binder placed.  The patient was awoke extubated taken  to recovery in satisfactory condition.

## 2021-02-02 NOTE — Anesthesia Procedure Notes (Signed)
Procedure Name: LMA Insertion Date/Time: 02/02/2021 9:40 AM Performed by: Signe Colt, CRNA Pre-anesthesia Checklist: Patient identified, Emergency Drugs available, Suction available and Patient being monitored Patient Re-evaluated:Patient Re-evaluated prior to induction Oxygen Delivery Method: Circle System Utilized Preoxygenation: Pre-oxygenation with 100% oxygen Induction Type: IV induction Ventilation: Mask ventilation without difficulty LMA: LMA inserted LMA Size: 4.0 Number of attempts: 1 Airway Equipment and Method: bite block Placement Confirmation: positive ETCO2 Tube secured with: Tape Dental Injury: Teeth and Oropharynx as per pre-operative assessment

## 2021-02-02 NOTE — Interval H&P Note (Signed)
History and Physical Interval Note:  02/02/2021 9:21 AM  Amanda Rich  has presented today for surgery, with the diagnosis of RIGHT BREAST CANCER.  The various methods of treatment have been discussed with the patient and family. After consideration of risks, benefits and other options for treatment, the patient has consented to  Procedure(s): RIGHT BREAST LUMPECTOMY WITH RADIOACTIVE SEED AND RIGHT SENTINEL LYMPH NODE MAPPING (Right) as a surgical intervention.  The patient's history has been reviewed, patient examined, no change in status, stable for surgery.  I have reviewed the patient's chart and labs.  Questions were answered to the patient's satisfaction.     [place]

## 2021-02-02 NOTE — Anesthesia Preprocedure Evaluation (Signed)
Anesthesia Evaluation  Patient identified by MRN, date of birth, ID band Patient awake    Reviewed: Allergy & Precautions, NPO status , Patient's Chart, lab work & pertinent test results  Airway Mallampati: I  TM Distance: >3 FB Neck ROM: Full    Dental  (+) Edentulous Upper   Pulmonary Current Smoker,    Pulmonary exam normal breath sounds clear to auscultation       Cardiovascular Normal cardiovascular exam Rhythm:Regular Rate:Normal  Hx/o pericarditis   Neuro/Psych PSYCHIATRIC DISORDERS Depression negative neurological ROS     GI/Hepatic GERD  Medicated and Controlled,  Endo/Other  Right breast Ca  Renal/GU   negative genitourinary   Musculoskeletal negative musculoskeletal ROS (+)   Abdominal   Peds  Hematology  (+) anemia ,   Anesthesia Other Findings   Reproductive/Obstetrics                             Anesthesia Physical Anesthesia Plan  ASA: II  Anesthesia Plan: General   Post-op Pain Management:  Regional for Post-op pain   Induction: Intravenous  PONV Risk Score and Plan: 3 and Treatment may vary due to age or medical condition, Midazolam and Ondansetron  Airway Management Planned: LMA  Additional Equipment:   Intra-op Plan:   Post-operative Plan: Extubation in OR  Informed Consent: I have reviewed the patients History and Physical, chart, labs and discussed the procedure including the risks, benefits and alternatives for the proposed anesthesia with the patient or authorized representative who has indicated his/her understanding and acceptance.     Dental advisory given  Plan Discussed with: Anesthesiologist and CRNA  Anesthesia Plan Comments:         Anesthesia Quick Evaluation

## 2021-02-02 NOTE — Anesthesia Postprocedure Evaluation (Signed)
Anesthesia Post Note  Patient: Amanda Rich  Procedure(s) Performed: RIGHT BREAST LUMPECTOMY WITH RADIOACTIVE SEED AND RIGHT SENTINEL LYMPH NODE MAPPING (Right Breast)     Patient location during evaluation: PACU Anesthesia Type: General Level of consciousness: awake and alert and oriented Pain management: pain level controlled Vital Signs Assessment: post-procedure vital signs reviewed and stable Respiratory status: spontaneous breathing, nonlabored ventilation and respiratory function stable Cardiovascular status: blood pressure returned to baseline and stable Postop Assessment: no apparent nausea or vomiting Anesthetic complications: no   No complications documented.  Last Vitals:  Vitals:   02/02/21 1143 02/02/21 1205  BP:  113/74  Pulse: (!) 107 98  Resp: (!) 22 18  Temp:  36.4 C  SpO2: (!) 9% 96%    Last Pain:  Vitals:   02/02/21 1205  TempSrc:   PainSc: 4                  Satrina Magallanes A.

## 2021-02-02 NOTE — Discharge Instructions (Signed)
[place] Office Phone Number [phone]  BREAST BIOPSY/ PARTIAL MASTECTOMY: POST OP INSTRUCTIONS  Always review your discharge instruction sheet given to you by the facility where your surgery was performed.  IF YOU HAVE DISABILITY OR FAMILY LEAVE FORMS, YOU MUST BRING THEM TO THE OFFICE FOR PROCESSING.  DO NOT GIVE THEM TO YOUR DOCTOR.  1. A prescription for pain medication may be given to you upon discharge.  Take your pain medication as prescribed, if needed.  If narcotic pain medicine is not needed, then you may take acetaminophen (Tylenol) or ibuprofen (Advil) as needed. 2. Take your usually prescribed medications unless otherwise directed 3. If you need a refill on your pain medication, please contact your pharmacy.  They will contact our office to request authorization.  Prescriptions will not be filled after 5pm or on week-ends. 4. You should eat very light the first 24 hours after surgery, such as soup, crackers, pudding, etc.  Resume your normal diet the day after surgery. 5. Most patients will experience some swelling and bruising in the breast.  Ice packs and a good support bra will help.  Swelling and bruising can take several days to resolve.  6. It is common to experience some constipation if taking pain medication after surgery.  Increasing fluid intake and taking a stool softener will usually help or prevent this problem from occurring.  A mild laxative (Milk of Magnesia or Miralax) should be taken according to package directions if there are no bowel movements after 48 hours. 7. Unless discharge instructions indicate otherwise, you may remove your bandages 24-48 hours after surgery, and you may shower at that time.  You may have steri-strips (small skin tapes) in place directly over the incision.  These strips should be left on the skin for 7-10 days.  If your surgeon used skin glue on the incision, you may shower in 24 hours.  The glue will flake off over the  next 2-3 weeks.  Any sutures or staples will be removed at the office during your follow-up visit. 8. ACTIVITIES:  You may resume regular daily activities (gradually increasing) beginning the next day.  Wearing a good support bra or sports bra minimizes pain and swelling.  You may have sexual intercourse when it is comfortable. a. You may drive when you no longer are taking prescription pain medication, you can comfortably wear a seatbelt, and you can safely maneuver your car and apply brakes. b. RETURN TO WORK:  ______________________________________________________________________________________ 9. You should see your doctor in the office for a follow-up appointment approximately two weeks after your surgery.  Your doctor's nurse will typically make your follow-up appointment when she calls you with your pathology report.  Expect your pathology report 2-3 business days after your surgery.  You may call to check if you do not hear from [place] after three days. 10. OTHER INSTRUCTIONS: _______________________________________________________________________________________________ _____________________________________________________________________________________________________________________________________ _____________________________________________________________________________________________________________________________________ _____________________________________________________________________________________________________________________________________  WHEN TO CALL YOUR DOCTOR: 1. Fever over 101.0 2. Nausea and/or vomiting. 3. Extreme swelling or bruising. 4. Continued bleeding from incision. 5. Increased pain, redness, or drainage from the incision.  The clinic staff is available to answer your questions during regular business hours.  Please don't hesitate to call and ask to speak to one of the nurses for clinical concerns.  If you have a medical emergency, go to the nearest  emergency room or call 911.  A surgeon from [facility] is always on call at the hospital.  For further questions, please visit [email or web address]  Post Anesthesia Home Care Instructions  Activity: Get plenty of rest for the remainder of the day. A responsible individual must stay with you for 24 hours following the procedure.  For the next 24 hours, DO NOT: -Drive a car -Paediatric nurse -Drink alcoholic beverages -Take any medication unless instructed by your physician -Make any legal decisions or sign important papers.  Meals: Start with liquid foods such as gelatin or soup. Progress to regular foods as tolerated. Avoid greasy, spicy, heavy foods. If nausea and/or vomiting occur, drink only clear liquids until the nausea and/or vomiting subsides. Call your physician if vomiting continues.  Special Instructions/Symptoms: Your throat may feel dry or sore from the anesthesia or the breathing tube placed in your throat during surgery. If this causes discomfort, gargle with warm salt water. The discomfort should disappear within 24 hours.  If you had a scopolamine patch placed behind your ear for the management of post- operative nausea and/or vomiting:  1. The medication in the patch is effective for 72 hours, after which it should be removed.  Wrap patch in a tissue and discard in the trash. Wash hands thoroughly with soap and water. 2. You may remove the patch earlier than 72 hours if you experience unpleasant side effects which may include dry mouth, dizziness or visual disturbances. 3. Avoid touching the patch. Wash your hands with soap and water after contact with the patch.    Information for Discharge Teaching: EXPAREL (bupivacaine liposome injectable suspension)   Your surgeon or anesthesiologist gave you EXPAREL(bupivacaine) to help control your pain after surgery.   EXPAREL is a local anesthetic that provides pain relief by numbing the tissue around the  surgical site.  EXPAREL is designed to release pain medication over time and can control pain for up to 72 hours.  Depending on how you respond to EXPAREL, you may require less pain medication during your recovery.  Possible side effects:  Temporary loss of sensation or ability to move in the area where bupivacaine was injected.  Nausea, vomiting, constipation  Rarely, numbness and tingling in your mouth or lips, lightheadedness, or anxiety may occur.  Call your doctor right away if you think you may be experiencing any of these sensations, or if you have other questions regarding possible side effects.  Follow all other discharge instructions given to you by your surgeon or nurse. Eat a healthy diet and drink plenty of water or other fluids.  If you return to the hospital for any reason within 96 hours following the administration of EXPAREL, it is important for health care providers to know that you have received this anesthetic. A teal colored band has been placed on your arm with the date, time and amount of EXPAREL you have received in order to alert and inform your health care providers. Please leave this armband in place for the full 96 hours following administration, and then you may remove the band.

## 2021-02-02 NOTE — Anesthesia Procedure Notes (Addendum)
Anesthesia Regional Block: Pectoralis block   Pre-Anesthetic Checklist: ,, timeout performed, Correct Patient, Correct Site, Correct Laterality, Correct Procedure, Correct Position, site marked, Risks and benefits discussed,  Surgical consent,  Pre-op evaluation,  At surgeon's request and post-op pain management  Laterality: Right  Prep: chloraprep       Needles:  Injection technique: Single-shot  Needle Type: Echogenic Stimulator Needle     Needle Length: 9cm  Needle Gauge: 21   Needle insertion depth: 7 cm   Additional Needles:   Procedures:,,,, ultrasound used (permanent image in chart),,,,  Narrative:  Start time: 02/02/2021 8:55 AM End time: 02/02/2021 9:00 AM Injection made incrementally with aspirations every 5 mL.  Performed by: Personally  Anesthesiologist: Josephine Igo, MD  Additional Notes: Timeout performed. Patient sedated. Relevant anatomy ID'd using [place]. Incremental 2-15ml injection of LA with frequent aspiration. Patient tolerated procedure well.        Right Pectoralis Block

## 2021-02-02 NOTE — Transfer of Care (Signed)
Immediate Anesthesia Transfer of Care Note  Patient: Amanda Rich  Procedure(s) Performed: RIGHT BREAST LUMPECTOMY WITH RADIOACTIVE SEED AND RIGHT SENTINEL LYMPH NODE MAPPING (Right Breast)  Patient Location: PACU  Anesthesia Type:GA combined with regional for post-op pain  Level of Consciousness: drowsy and patient cooperative  Airway & Oxygen Therapy: Patient Spontanous Breathing and Patient connected to face mask oxygen  Post-op Assessment: Report given to RN and Post -op Vital signs reviewed and stable  Post vital signs: Reviewed and stable  Last Vitals:  Vitals Value Taken Time  BP    Temp    Pulse    Resp    SpO2      Last Pain:  Vitals:   02/02/21 0816  TempSrc: Oral  PainSc: 0-No pain      Patients Stated Pain Goal: 4 (72/[id number])  Complications: No complications documented.

## 2021-02-02 NOTE — H&P (Signed)
Amanda Rich  Location: [facility] Patient #: [id number] DOB: 1966/05/16 Undefined / Language: Amanda Rich / Race: Black or African American Female  History of Present Illness [name] A. Cornet 11:14 AM) Patient words: 55 year old female with an enlarging palpable right breast lump for approximately 1 year and palpable left breast lump for 2 years. Left side bx benign right upper outer left breast IDC grade 3 as below No other history except above     DDITIONAL INFORMATION: 1. FLUORESCENCE IN-SITU HYBRIDIZATION Results: GROUP 1: HER2 **POSITIVE** On the tissue sample received from this individual HER2 FISH was performed by a technologist and cell imaging and analysis on the [employer]. RATIO OF HER2/CEN 17 SIGNALS 3.44 AVERAGE HER2 COPY NUMBER PER CELL 4.65 The ratio of HER2/CEN 17 result exceeds the cutoff value of >=2.0 and a copy number of HER2 signals exceeding the cutoff range of >=4.0 signals per cell. Arch Pathol Lab Med 1:1,2018 Thressa Sheller MD Pathologist, Electronic Signature ( Signed 08/31/2020) 1. PROGNOSTIC INDICATORS Results: IMMUNOHISTOCHEMICAL AND MORPHOMETRIC ANALYSIS PERFORMED MANUALLY The tumor cells are EQUIVOCAL for Her2 (2+). Her2 by FISH will be performed and results reported separately. Estrogen Receptor: 30%, POSITIVE, WEAK STAINING INTENSITY Progesterone Receptor: 0%, NEGATIVE Proliferation Marker Ki67: 50% COMMENT: The negative hormone receptor study(ies) in this case has an internal positive control. REFERENCE RANGE ESTROGEN RECEPTOR 1 of 3 FINAL for Amanda Rich ([id number]) ADDITIONAL INFORMATION:(continued) NEGATIVE 0% POSITIVE =>1% REFERENCE RANGE PROGESTERONE RECEPTOR NEGATIVE 0% POSITIVE =>1% All controls stained appropriately Thressa Sheller MD Pathologist, Electronic Signature ( Signed 08/25/2020) FINAL DIAGNOSIS Diagnosis 1. Breast, right, needle core biopsy, 12:30 o'clock, 5cmfn - INVASIVE DUCTAL CARCINOMA.  SEE NOTE 2. Breast, right, needle core biopsy, 12:30 o'clock, 1cmfn - FIBROCYSTIC CHANGE - NEGATIVE FOR CARCINOMA 3. Lymph node, needle/core biopsy, right axilla - BENIGN LYMPH NODE Diagnosis Note 1. Carcinoma measures 1.5 cm in greatest linear dimension and appears grade 3. Dr. Tresa Moore reviewed the case and concurs with the diagnosis. A breast prognostic profile (ER, PR, Ki-67 and HER2) is pending and will be reported in an addendum. [facility] was notified on 08/24/2020. [name] MD Pathologist, Electronic Signature (Case signed 08/24/2020) Specimen                  EXAM: DIGITAL DIAGNOSTIC BILATERAL MAMMOGRAM WITH CAD AND TOMO  ULTRASOUND BILATERAL BREAST  COMPARISON: Previous exam(s).  ACR Breast Density Category c: The breast tissue is heterogeneously dense, which may obscure small masses.  FINDINGS: Radiopaque BBs are placed at the site of the patient's bilateral palpable lumps. An irregular, hyperdense mass is seen in the upper inner quadrant at far posterior depth on the right, in correlation with the radiopaque BB. An additional partially circumscribed mass is identified in the central medial aspect at middle depth.  A circumscribed mixed echogenicity, fat containing mass is seen deep to the radiopaque BB in the upper inner quadrant of the left breast. No additional suspicious findings are identified.  Mammographic images were processed with CAD.  Targeted ultrasound is performed, showing an irregular hypoechoic mass at the 12:30 position 5 cm from the nipple. It measures 2.7 x 2.6 x 1.8 cm and demonstrates internal vascularity. An additional oval, circumscribed hypoechoic mass is identified at the 12:30 position 1 cm from the nipple. It measures 8 x 7 x 7 mm. There is no internal vascularity. Evaluation of the right axilla demonstrates borderline cortical thickening up to 0.33  mm.  Evaluation of the left breast demonstrates a  circumscribed mixed echogenicity mass at the 11 o'clock position 5 cm from the nipple measuring up to 4 cm. This corresponds with the mammographic finding and is consistent with a hamartoma.  IMPRESSION: 1. Suspicious right breast mass at the 12:30 position 5 cm from the nipple corresponding with the patient's palpable lump. Recommendation is for ultrasound-guided biopsy. 2. Indeterminate right breast mass at the 12:30 position 1 cm from the nipple. Recommendation is for ultrasound-guided biopsy. 3. Borderline right axillary lymphadenopathy. Recommendation is for ultrasound-guided biopsy. 4. Benign left breast hamartoma corresponding with the patient's palpable lump.  RECOMMENDATION: 1. Three area ultrasound-guided biopsy of the right breast/axilla. 2. Pending above biopsy results, further evaluation with contrast enhanced breast MRI is recommended given the multiplicity of findings and deep location of the patient's palpable mass.  I have discussed the findings and recommendations with the patient. If applicable, a reminder letter will be sent to the patient regarding the next appointment.  BI-RADS CATEGORY 5: Highly suggestive of malignancy.   Electronically Signed By: [name] M.D. On: 08/17/2020 14:33.  The patient is a 55 year old female.   Past Surgical History ( Breast Biopsy Bilateral. Colon Polyp Removal - Colonoscopy Sentinel Lymph Node Biopsy Ventral / Umbilical Hernia Surgery Bilateral.  Diagnostic Studies History ( Colonoscopy 1-5 years ago Mammogram within last year Pap Smear 1-5 years ago  Allergies Conni Slipper, RN; 09/01/2020 8:12 AM) Clarithromycin *CHEMICALS* Swelling.  Medication History Albuterol (90MCG/ACT Aerosol Soln, Inhalation) Active. Biotin (5000MCG Tablet, Oral) Active. Cholecalciferol (50 MCG(2000 UT) Tablet Chewable, Oral) Active. CeleXA (20MG Tablet,  Oral) Active. Mitigare (0.6MG Capsule, Oral) Active. Multiple Vitamin (1 (one) Oral) Active. Medications Reconciled  Social History 4 AM) Alcohol use Occasional alcohol use. Caffeine use Carbonated beverages, Coffee. No drug use Tobacco use Current every day smoker.  Family History :9 AM) Alcohol Abuse Father, Mother. Arthritis Family Members In General. Bleeding disorder Family Members In General. Cancer Son. Colon Cancer Family Members In General. Colon Polyps Family Members In General. Diabetes Mellitus Mother. Hypertension Mother. Ischemic Bowel Disease Mother. Kidney Disease Family Members In General, Mother.  Pregnancy / Birth History  Age at menarche 32 years. Age of menopause 65-50 Contraceptive History Depo-provera, Oral contraceptives. Gravida 3 Maternal age 54-20 Para 3 Regular periods  Other Problems ( Depression Myocardial infarction     Review of Systems ( General Present- Fatigue and Weight Loss. Not Present- Appetite Loss, Chills, Fever, Night Sweats and Weight Gain. Skin Not Present- Change in Wart/Mole, Dryness, Hives, Jaundice, New Lesions, Non-Healing Wounds, Rash and Ulcer. HEENT Present- Wears glasses/contact lenses. Not Present- Earache, Hearing Loss, Hoarseness, Nose Bleed, Oral Ulcers, Ringing in the Ears, Seasonal Allergies, Sinus Pain, Sore Throat, Visual Disturbances and Yellow Eyes. Respiratory Present- Chronic Cough. Not Present- Bloody sputum, Difficulty Breathing, Snoring and Wheezing. Breast Present- Breast Mass. Not Present- Breast Pain, Nipple Discharge and Skin Changes. Cardiovascular Not Present- Chest Pain, Difficulty Breathing Lying Down, Leg Cramps, Palpitations, Rapid Heart Rate, Shortness of Breath and Swelling of Extremities. Gastrointestinal Not Present- Abdominal Pain, Bloating, Bloody Stool, Change in Bowel Habits, Chronic diarrhea, Constipation, Difficulty Swallowing, Excessive gas, Gets  full quickly at meals, Hemorrhoids, Indigestion, Nausea, Rectal Pain and Vomiting. Female Genitourinary Not Present- Frequency, Nocturia, Painful Urination, Pelvic Pain and Urgency. Musculoskeletal Present- Joint Pain. Not Present- Back Pain, Joint Stiffness, Muscle Pain, Muscle Weakness and Swelling of Extremities. Neurological Not Present- Decreased Memory, Fainting, Headaches, Numbness, Seizures, Tingling, Tremor, Trouble walking and Weakness. Psychiatric Present- Change in Sleep Pattern. Not Present- Anxiety, Bipolar,  Depression, Fearful and Frequent crying. Endocrine Present- Cold Intolerance. Not Present- Excessive Hunger, Hair Changes, Heat Intolerance, Hot flashes and New Diabetes. Hematology Not Present- Blood Thinners, Easy Bruising, Excessive bleeding, Gland problems, HIV and Persistent Infections.   Physical Exam (  General Mental Status-Alert. General Appearance-Consistent with stated age. Hydration-Well hydrated. Voice-Normal.  Head and Neck Head-normocephalic, atraumatic with no lesions or palpable masses. Trachea-midline. Thyroid Gland Characteristics - normal size and consistency.  Breast Note: mobile 3 cm right breast mass UOQ not fixed left breast mass 2 cm mobile no nipple discharge noted  Cardiovascular Cardiovascular examination reveals -normal heart sounds, regular rate and rhythm with no murmurs and normal pedal pulses bilaterally.  Abdomen Inspection Inspection of the abdomen reveals - No Hernias. Skin - Scar - no surgical scars. Palpation/Percussion Palpation and Percussion of the abdomen reveal - Soft, Non Tender, No Rebound tenderness, No Rigidity (guarding) and No hepatosplenomegaly. Auscultation Auscultation of the abdomen reveals - Bowel sounds normal.  Neurologic Neurologic evaluation reveals -alert and oriented x 3 with no impairment of recent or remote memory. Mental Status-Normal.  Neuropsychiatric The  patient's mood and affect are described as -normal. Associations-intact.  Musculoskeletal Normal Exam - Left-Upper Extremity Strength Normal and Lower Extremity Strength Normal. Normal Exam - Right-Upper Extremity Strength Normal and Lower Extremity Strength Normal.  Lymphatic Head & Neck  General Head & Neck Lymphatics: Bilateral - Description - Normal. Axillary  General Axillary Region: Bilateral - Description - Normal. Tenderness - Non Tender.    Assessment & Plan (Linc Renne A. Cassidie Veiga MD; 09/01/2020 11:18 AM)  BREAST CANCER, STAGE 2, RIGHT (C50.911) Impression: complete chemotherapy   Right breast seed lumpectomy  Right SLN mapping   The procedure has been discussed with the patient. Alternatives to surgery have been discussed with the patient.  Risks of surgery include bleeding,  Infection,  Seroma formation, death,  and the need for further surgery.   The patient understands and wishes to proceed.  Sentinel lymph node mapping and dissection has been discussed with the patient.  Risk of bleeding,  Infection,  Seroma formation,  Additional procedures,,  Shoulder weakness ,  Shoulder stiffness,  Nerve and blood vessel injury and reaction to the mapping dyes have been discussed.  Alternatives to surgery have been discussed with the patient.  The patient agrees to proceed.

## 2021-02-02 NOTE — Progress Notes (Signed)
Assisted Dr. Foster with right, ultrasound guided, pectoralis block. Side rails up, monitors on throughout procedure. See vital signs in flow sheet. Tolerated Procedure well. 

## 2021-02-02 NOTE — Progress Notes (Signed)
Nuc med at bedside for injections.  VSS, and pt tolerate procedure well.

## 2021-02-03 ENCOUNTER — Encounter (HOSPITAL_BASED_OUTPATIENT_CLINIC_OR_DEPARTMENT_OTHER): Payer: Self-pay | Admitting: Surgery

## 2021-02-03 ENCOUNTER — Encounter (HOSPITAL_COMMUNITY): Payer: Medicaid Other | Admitting: Physician Assistant

## 2021-02-04 DIAGNOSIS — C50211 Malignant neoplasm of upper-inner quadrant of right female breast: Secondary | ICD-10-CM | POA: Diagnosis not present

## 2021-02-04 LAB — SURGICAL PATHOLOGY

## 2021-02-07 ENCOUNTER — Encounter: Payer: Self-pay | Admitting: *Deleted

## 2021-02-07 DIAGNOSIS — C50211 Malignant neoplasm of upper-inner quadrant of right female breast: Secondary | ICD-10-CM | POA: Diagnosis not present

## 2021-02-08 NOTE — Assessment & Plan Note (Addendum)
Patient palpated a right breast lump x38yrand a left breast lump x295yrDiagnostic mammogram and USKoreahowed in the right breast, a 2.7cm mass 5cm from the nipple and 0.8cm mass 1cm from the nipple at the 12:30 position, with borderline cortical thickening in the right axilla, and in the left breast, a 4.0cm mass at the 11 o'clock position representing a hamartoma. 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  Treatment plan: 1. Neoadjuvant chemotherapy with TCH Perjeta 6 cycles completed 01/16/2021 followed by Herceptin maintenance for 1 year 2. 02/02/2021:Right lumpectomy ([facility]): no residual carcinoma, 4 right axillary lymph nodes negative for carcinoma. 3. Followed by adjuvant radiation therapy if patient had lumpectomy 4.Followed by adjuvant antiestrogen therapy ------------------------------------------------------------------------------------------------------------------------------------------- Current treatment:  Maintenance Herceptin every 3 weeks We will refer the patient to radiation oncology After radiation is complete she will go on antiestrogen therapy.

## 2021-02-08 NOTE — Progress Notes (Signed)
Patient Care Team: Girtha Rm, NP-C as PCP - General (Family Medicine) Sueanne Margarita, MD as PCP - Cardiology (Cardiology) Rockwell Germany, RN as Oncology Nurse Navigator [name], RN as Oncology Nurse Navigator [name], MD as Consulting Physician (General Surgery) Nicholas Lose, MD as Consulting Physician (Hematology and Oncology) Gery Pray, MD as Consulting Physician (Radiation Oncology)  DIAGNOSIS:    ICD-10-CM   1. Malignant neoplasm of 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, estrogen receptor positive ([place])  C50.211    Z17.0     SUMMARY OF ONCOLOGIC HISTORY: Oncology History  Malignant neoplasm of 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, estrogen receptor positive ([place])  08/25/2020 Initial Diagnosis   Patient palpated a right breast lump x19yrand a left breast lump x285yrDiagnostic mammogram and USKoreahowed in the right breast, a 2.7cm mass 5cm from the nipple and 0.8cm mass 1cm from the nipple at the 12:30 position, with borderline cortical thickening in the right axilla, and in the left breast, a 4.0cm mass at the 11 o'clock position representing a hamartoma. 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   09/13/2020 - 01/18/2021 Chemotherapy   [facility]       02/02/2021 Surgery   Right lumpectomy ([facility]): no residual carcinoma, 4 right axillary lymph nodes negative for carcinoma.   02/07/2021 -  Chemotherapy      Patient is on Antibody Plan: BREAST TRASTUZUMAB Q21D      CHIEF COMPLIANT: Follow-up s/p lumpectomy   INTERVAL HISTORY: Amanda Rich a [age].o. with above-mentioned history of right breast cancer who completed neoadjuvant chemotherapy. She underwent a right lumpectomy on 02/02/21 with Dr. CoBrantley Stageor which pathology showed no residual carcinoma, 4  right axillary lymph nodes negative for carcinoma. She presents today to review the pathology report and discuss further treatment.   ALLERGIES:  is allergic to clarithromycin.  MEDICATIONS:  Current Outpatient Medications  Medication Sig Dispense Refill  . acetaminophen (TYLENOL) 500 MG tablet Take 500 mg by mouth every 6 (six) hours as needed for mild pain, fever or headache.    . Biotin 5000 MCG CAPS Take 5,000 mcg by mouth daily.     . Cholecalciferol (VITAMIN D3) 2000 units TABS Take 2,000 Units by mouth daily.     . citalopram (CELEXA) 10 MG tablet Take 1 tablet (10 mg total) by mouth daily. 90 tablet 3  . guaiFENesin (MUCINEX) 600 MG 12 hr tablet Take 600 mg by mouth 2 (two) times daily as needed for cough.    . Marland KitchenYDROcodone-acetaminophen (NORCO/VICODIN) 5-325 MG tablet Take 1 tablet by mouth every 6 (six) hours as needed for moderate pain. 15 tablet 0  . ibuprofen (ADVIL) 800 MG tablet Take 1 tablet (800 mg total) by mouth every 8 (eight) hours as needed. 30 tablet 0  . loperamide (IMODIUM) 2 MG capsule Take 1 capsule (2 mg total) by mouth as needed for diarrhea or loose stools. 30 capsule 0  . loratadine (CLARITIN) 10 MG tablet Take 1 tablet (10 mg total) by mouth daily.    . Melatonin 1 MG CHEW Chew by mouth.    . Multiple Vitamin (MULTIVITAMIN WITH MINERALS) TABS Take 1 tablet by mouth daily.    . nicotine (NICODERM CQ) 7 mg/24hr patch Place 1 patch (7 mg total)  onto the skin daily. 28 patch 0  . omeprazole (PRILOSEC) 20 MG capsule Take 1 capsule (20 mg total) by mouth daily.    . TURMERIC PO Take 1 tablet by mouth daily.      No current facility-administered medications for this visit.    PHYSICAL EXAMINATION: ECOG PERFORMANCE STATUS: 1 - Symptomatic but completely ambulatory  There were no vitals filed for this visit. There were no vitals filed for this visit.   LABORATORY DATA:  I have reviewed the data as listed CMP Latest Ref Rng & Units 01/31/2021 01/14/2021 12/20/2020   Glucose 70 - 99 mg/dL 92 91 100(H)  BUN 6 - 20 mg/dL 10 12 8   Creatinine 0.44 - 1.00 mg/dL 0.63 0.71 0.56  Sodium 135 - 145 mmol/L 140 141 139  Potassium 3.5 - 5.1 mmol/L 3.6 3.8 3.8  Chloride 98 - 111 mmol/L 108 106 106  CO2 22 - 32 mmol/L 25 26 25   Calcium 8.9 - 10.3 mg/dL 8.9 9.2 9.0  Total Protein 6.5 - 8.1 g/dL 6.5 6.7 6.7  Total Bilirubin 0.3 - 1.2 mg/dL 0.3 0.3 0.2(L)  Alkaline Phos 38 - 126 U/L 75 63 65  AST 15 - 41 U/L 20 20 32  ALT 0 - 44 U/L 16 20 33    Lab Results  Component Value Date   WBC 4.8 01/31/2021   HGB 10.4 (L) 01/31/2021   HCT 31.7 (L) 01/31/2021   MCV 88.5 01/31/2021   PLT 170 01/31/2021   NEUTROABS 2.6 01/31/2021    ASSESSMENT & PLAN:  Malignant neoplasm of 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, estrogen receptor positive ([place]) Patient palpated a right breast lump x56yrand a left breast lump x213yrDiagnostic mammogram and USKoreahowed in the right breast, a 2.7cm mass 5cm from the nipple and 0.8cm mass 1cm from the nipple at the 12:30 position, with borderline cortical thickening in the right axilla, and in the left breast, a 4.0cm mass at the 11 o'clock position representing a hamartoma. 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  Treatment plan: 1. Neoadjuvant chemotherapy with TCH Perjeta 6 cycles followed by Herceptin maintenance for 1 year 2. Followed by breast conserving surgery if possible with sentinel lymph node study 3. Followed by adjuvant radiation therapy if patient had lumpectomy 4.Followed by adjuvant antiestrogen therapy ------------------------------------------------------------------------------------------------------------------------------------------- Current treatment: Cycle 3TCH Perjeta Echocardiogram 09/06/2020: EF 60 to 65%  Chemo toxicities: 1.Diarrhea:She now knows  how to manage diarrhea with Imodium. 2.nausea and vomiting:Improved after taking Prilosec for heartburn. 3.Fatigue: Continues to be problem 4.Lack of taste and appetite: She is very sad that for Thanksgiving she may not have good taste. 5.Elevated LFTs:   Slightly better than before.  Okay to treat. 6.Chemo induced anemia: Monitoring 7.Elevated blood sugars: I discontinued dexamethasone pills.  I strongly discouraged her from eating any sugary foods.  She is eating a lot of cakes and pies  Monitoring closely for toxicities. Return to clinic in3 weeks for cycle 4    No orders of the defined types were placed in this encounter.  The patient has a good understanding of the overall plan. she agrees with it. she will call with any problems that may develop before the next visit here.  Total time spent: 30 mins including face to face time and time spent for planning, charting and coordination  of care  Rulon Eisenmenger, MD, MPH 02/09/2021  I, Molly Dorshimer, am acting as scribe for Dr. Nicholas Lose.  I have reviewed the above documentation for accuracy and completeness, and I agree with the above.

## 2021-02-09 ENCOUNTER — Telehealth: Payer: Self-pay | Admitting: Hematology and Oncology

## 2021-02-09 ENCOUNTER — Encounter: Payer: Self-pay | Admitting: *Deleted

## 2021-02-09 ENCOUNTER — Inpatient Hospital Stay (HOSPITAL_BASED_OUTPATIENT_CLINIC_OR_DEPARTMENT_OTHER): Payer: Medicaid Other | Admitting: Hematology and Oncology

## 2021-02-09 DIAGNOSIS — Z170 Estrogen receptor positive status [ER+]: Secondary | ICD-10-CM

## 2021-02-09 DIAGNOSIS — C50211 Malignant neoplasm of upper-inner quadrant of right female breast: Secondary | ICD-10-CM

## 2021-02-09 NOTE — Telephone Encounter (Signed)
Changed 2/16 appt to a telephone visit called and spoke with pt, confirmed appt change

## 2021-02-11 ENCOUNTER — Telehealth: Payer: Self-pay | Admitting: Hematology and Oncology

## 2021-02-11 DIAGNOSIS — C50211 Malignant neoplasm of upper-inner quadrant of right female breast: Secondary | ICD-10-CM | POA: Diagnosis not present

## 2021-02-11 NOTE — Telephone Encounter (Signed)
Scheduled per 2/16 los. Pt will receive an updated appt calendar per next visit appt notes

## 2021-02-17 ENCOUNTER — Other Ambulatory Visit: Payer: Self-pay | Admitting: *Deleted

## 2021-02-17 DIAGNOSIS — Z170 Estrogen receptor positive status [ER+]: Secondary | ICD-10-CM

## 2021-02-17 DIAGNOSIS — C50211 Malignant neoplasm of upper-inner quadrant of right female breast: Secondary | ICD-10-CM

## 2021-02-20 NOTE — Assessment & Plan Note (Signed)
Patient palpated a right breast lump x32yrand a left breast lump x256yrDiagnostic mammogram and USKoreahowed in the right breast, a 2.7cm mass 5cm from the nipple and 0.8cm mass 1cm from the nipple at the 12:30 position, with borderline cortical thickening in the right axilla, and in the left breast, a 4.0cm mass at the 11 o'clock position representing a hamartoma. 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  Treatment plan: 1. Neoadjuvant chemotherapy with TCH Perjeta 6 cycles followed by Herceptin maintenance for 1 year 2. Followed by breast conserving surgery if possible with sentinel lymph node study 3. Followed by adjuvant radiation therapy if patient had lumpectomy 4.Followed by adjuvant antiestrogen therapy ------------------------------------------------------------------------------------------------------------------------------------------- Current treatment: Cycle4TCH Perjeta Echocardiogram 09/06/2020: EF 60 to 65%  Chemo toxicities: 1.Diarrhea:She now knows how to manage diarrhea with Imodium. 2.nausea and vomiting:Improved after taking Prilosec for heartburn. 3.Fatigue: Continues to be problem 4.Lack of taste and appetite:She is very sad that for Thanksgiving she may not have good taste. 5.Elevated LFTs:Slightly better than before. Okay to treat. 6.Chemo induced anemia: Monitoring 7.Elevated blood sugars: I discontinued dexamethasone pills.I strongly discouraged her from eating any sugary foods. She is eating a lot of cakes and pies  Monitoring closely for toxicities. Return to clinic in3 weeks for cycle5

## 2021-02-20 NOTE — Progress Notes (Signed)
Patient Care Team: Girtha Rm, NP-C as PCP - General (Family Medicine) Sueanne Margarita, MD as PCP - Cardiology (Cardiology) Rockwell Germany, RN as Oncology Nurse Navigator [name], RN as Oncology Nurse Navigator [name], MD as Consulting Physician (General Surgery) Nicholas Lose, MD as Consulting Physician (Hematology and Oncology) Gery Pray, MD as Consulting Physician (Radiation Oncology)  DIAGNOSIS:    ICD-10-CM   1. Malignant neoplasm of 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, estrogen receptor positive ([place])  C50.211    Z17.0     SUMMARY OF ONCOLOGIC HISTORY: Oncology History  Malignant neoplasm of 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, estrogen receptor positive ([place])  08/25/2020 Initial Diagnosis   Patient palpated a right breast lump x59yrand a left breast lump x261yrDiagnostic mammogram and USKoreahowed in the right breast, a 2.7cm mass 5cm from the nipple and 0.8cm mass 1cm from the nipple at the 12:30 position, with borderline cortical thickening in the right axilla, and in the left breast, a 4.0cm mass at the 11 o'clock position representing a hamartoma. 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   09/13/2020 - 01/18/2021 Chemotherapy   [facility]       02/02/2021 Surgery   Right lumpectomy ([facility]): no residual carcinoma, 4 right axillary lymph nodes negative for carcinoma.   02/07/2021 - 02/07/2021 Chemotherapy         02/21/2021 -  Chemotherapy      Patient is on Antibody Plan: BREAST TRASTUZUMAB + PERTUZUMAB Q21D      CHIEF COMPLIANT: Follow-up of right breast cancer  INTERVAL HISTORY: Amanda CUTBIRTHs a 89.o. with above-mentioned history of right breast cancer who completed neoadjuvant chemotherapy and underwent a right lumpectomy. She  presents today for follow-up.    ALLERGIES:  is allergic to clarithromycin.  MEDICATIONS:  Current Outpatient Medications  Medication Sig Dispense Refill  . acetaminophen (TYLENOL) 500 MG tablet Take 500 mg by mouth every 6 (six) hours as needed for mild pain, fever or headache.    . Biotin 5000 MCG CAPS Take 5,000 mcg by mouth daily.     . Cholecalciferol (VITAMIN D3) 2000 units TABS Take 2,000 Units by mouth daily.     . citalopram (CELEXA) 10 MG tablet Take 1 tablet (10 mg total) by mouth daily. 90 tablet 3  . guaiFENesin (MUCINEX) 600 MG 12 hr tablet Take 600 mg by mouth 2 (two) times daily as needed for cough.    . Marland KitchenYDROcodone-acetaminophen (NORCO/VICODIN) 5-325 MG tablet Take 1 tablet by mouth every 6 (six) hours as needed for moderate pain. 15 tablet 0  . ibuprofen (ADVIL) 800 MG tablet Take 1 tablet (800 mg total) by mouth every 8 (eight) hours as needed. 30 tablet 0  . loperamide (IMODIUM) 2 MG capsule Take 1 capsule (2 mg total) by mouth as needed for diarrhea or loose stools. 30 capsule 0  . loratadine (CLARITIN) 10 MG tablet Take 1 tablet (10 mg total) by mouth daily.    . Melatonin 1 MG CHEW Chew by mouth.    . Multiple Vitamin (MULTIVITAMIN WITH MINERALS) TABS Take 1 tablet by mouth daily.    . nicotine (NICODERM CQ) 7 mg/24hr patch Place 1 patch (7 mg total) onto the skin daily. 28 patch 0  . omeprazole (PRILOSEC)  20 MG capsule Take 1 capsule (20 mg total) by mouth daily.    . TURMERIC PO Take 1 tablet by mouth daily.      No current facility-administered medications for this visit.    PHYSICAL EXAMINATION: ECOG PERFORMANCE STATUS: 1 - Symptomatic but completely ambulatory  Vitals:   02/21/21 1002  BP: 115/71  Pulse: 79  Resp: 18  Temp: 97.7 F (36.5 C)  SpO2: 100%   Filed Weights   02/21/21 1002  Weight: 126 lb 6.4 oz (57.3 kg)    LABORATORY DATA:  I have reviewed the data as listed CMP Latest Ref Rng & Units 02/21/2021 01/31/2021 01/14/2021  Glucose 70 - 99  mg/dL 109(H) 92 91  BUN 6 - 20 mg/dL _0 Creatinine 0.44 - 1.00 mg/dL 0.69 0.63 0.71  Sodium 135 - 145 mmol/L 140 140 141  Potassium 3.5 - 5.1 mmol/L 3.3(L) 3.6 3.8  Chloride 98 - 111 mmol/L 108 108 106  CO2 22 - 32 mmol/L _1 Calcium 8.9 - 10.3 mg/dL 9.2 8.9 9.2  Total Protein 6.5 - 8.1 g/dL 6.9 6.5 6.7  Total Bilirubin 0.3 - 1.2 mg/dL 0.5 0.3 0.3  Alkaline Phos 38 - 126 U/L 79 75 63  AST 15 - 41 U/L _2 ALT 0 - 44 U/L _3 Lab Results  Component Value Date   WBC 5.3 02/21/2021   HGB 11.0 (L) 02/21/2021   HCT 34.0 (L) 02/21/2021   MCV 89.9 02/21/2021   PLT 185 02/21/2021   NEUTROABS 2.1 02/21/2021    ASSESSMENT & PLAN:  Malignant neoplasm of 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, estrogen receptor positive ([place]) Patient palpated a right breast lump x32yrand a left breast lump x239yrDiagnostic mammogram and USKoreahowed in the right breast, a 2.7cm mass 5cm from the nipple and 0.8cm mass 1cm from the nipple at the 12:30 position, with borderline cortical thickening in the right axilla, and in the left breast, a 4.0cm mass at the 11 o'clock position representing a hamartoma. 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  Treatment plan: 1. Neoadjuvant chemotherapy with TCH Perjeta 6 cycles completed 01/18/2021 followed by Herceptin Perjeta maintenance for 1 year 2. Followed by breast conserving surgery if possible with sentinel lymph node study 02/02/2021: Complete pathologic response, 0/4 lymph nodes negative 3. Followed by adjuvant radiation therapy if patient had lumpectomy 4.Followed by adjuvant antiestrogen therapy ------------------------------------------------------------------------------------------------------------------------------------------- Current treatment: Maintenance Herceptin and  Perjeta Echocardiogram 09/06/2020: EF 60 to 65%  Toxicities:  She will proceed to adjuvant radiation Mild intermittent diarrhea: Mild hypokalemia: She will take potassium containing foods.  She has appointments to see radiation oncology.  Monitoring closely for toxicities. Return to clinic every 3 weeks for Herceptin and Perjeta.    No orders of the defined types were placed in this encounter.  The patient has a good understanding of the overall plan. she agrees with it. she will call with any problems that may develop before the next visit here.  Total time spent: 30 mins including face to face time and time spent for planning, charting and coordination of care  Amanda EisenmengerMD, MPH 02/21/2021  I, MoCloyde Reamsorshimer, am acting as scribe for Dr. ViNicholas Lose I have reviewed the above documentation for accuracy and  completeness, and I agree with the above.

## 2021-02-21 ENCOUNTER — Inpatient Hospital Stay: Payer: Medicaid Other | Admitting: Physician Assistant

## 2021-02-21 ENCOUNTER — Inpatient Hospital Stay: Payer: Medicaid Other

## 2021-02-21 ENCOUNTER — Inpatient Hospital Stay (HOSPITAL_BASED_OUTPATIENT_CLINIC_OR_DEPARTMENT_OTHER): Payer: Medicaid Other | Admitting: Hematology and Oncology

## 2021-02-21 ENCOUNTER — Encounter: Payer: Self-pay | Admitting: Licensed Clinical Social Worker

## 2021-02-21 ENCOUNTER — Other Ambulatory Visit: Payer: Self-pay

## 2021-02-21 DIAGNOSIS — Z79899 Other long term (current) drug therapy: Secondary | ICD-10-CM | POA: Diagnosis not present

## 2021-02-21 DIAGNOSIS — Z5112 Encounter for antineoplastic immunotherapy: Secondary | ICD-10-CM | POA: Diagnosis not present

## 2021-02-21 DIAGNOSIS — C50211 Malignant neoplasm of upper-inner quadrant of right female breast: Secondary | ICD-10-CM | POA: Diagnosis not present

## 2021-02-21 DIAGNOSIS — Z170 Estrogen receptor positive status [ER+]: Secondary | ICD-10-CM

## 2021-02-21 LAB — CMP (CANCER CENTER ONLY)
ALT: 14 U/L (ref 0–44)
AST: 19 U/L (ref 15–41)
Albumin: 3.9 g/dL (ref 3.5–5.0)
Alkaline Phosphatase: 79 U/L (ref 38–126)
Anion gap: 8 (ref 5–15)
BUN: 9 mg/dL (ref 6–20)
CO2: 24 mmol/L (ref 22–32)
Calcium: 9.2 mg/dL (ref 8.9–10.3)
Chloride: 108 mmol/L (ref 98–111)
Creatinine: 0.69 mg/dL (ref 0.44–1.00)
GFR, Estimated: >60 mL/min (ref >60)
Glucose, Bld: 109 mg/dL — ABNORMAL HIGH (ref 70–99)
Potassium: 3.3 mmol/L — ABNORMAL LOW (ref 3.5–5.1)
Sodium: 140 mmol/L (ref 135–145)
Total Bilirubin: 0.5 mg/dL (ref 0.3–1.2)
Total Protein: 6.9 g/dL (ref 6.5–8.1)

## 2021-02-21 LAB — CBC WITH DIFFERENTIAL (CANCER CENTER ONLY)
Abs Immature Granulocytes: 0 10*3/uL (ref 0.00–0.07)
Basophils Absolute: 0 10*3/uL (ref 0.0–0.1)
Basophils Relative: 0 %
Eosinophils Absolute: 0.2 10*3/uL (ref 0.0–0.5)
Eosinophils Relative: 3 %
HCT: 34 % — ABNORMAL LOW (ref 36.0–46.0)
Hemoglobin: 11 g/dL — ABNORMAL LOW (ref 12.0–15.0)
Immature Granulocytes: 0 %
Lymphocytes Relative: 46 %
Lymphs Abs: 2.4 10*3/uL (ref 0.7–4.0)
MCH: 29.1 pg (ref 26.0–34.0)
MCHC: 32.4 g/dL (ref 30.0–36.0)
MCV: 89.9 fL (ref 80.0–100.0)
Monocytes Absolute: 0.6 10*3/uL (ref 0.1–1.0)
Monocytes Relative: 11 %
Neutro Abs: 2.1 10*3/uL (ref 1.7–7.7)
Neutrophils Relative %: 40 %
Platelet Count: 185 10*3/uL (ref 150–400)
RBC: 3.78 MIL/uL — ABNORMAL LOW (ref 3.87–5.11)
RDW: 13.9 % (ref 11.5–15.5)
WBC Count: 5.3 10*3/uL (ref 4.0–10.5)
nRBC: 0 % (ref 0.0–0.2)

## 2021-02-21 MED ORDER — SODIUM CHLORIDE 0.9 % IV SOLN
Freq: Once | INTRAVENOUS | Status: AC
  Filled 2021-02-21: qty 250

## 2021-02-21 MED ORDER — ACETAMINOPHEN 325 MG PO TABS
650.0000 mg | ORAL_TABLET | Freq: Once | ORAL | Status: AC
  Administered 2021-02-21: 650 mg via ORAL

## 2021-02-21 MED ORDER — DIPHENHYDRAMINE HCL 25 MG PO CAPS
ORAL_CAPSULE | ORAL | Status: AC
  Filled 2021-02-21: qty 1

## 2021-02-21 MED ORDER — DIPHENHYDRAMINE HCL 25 MG PO CAPS
25.0000 mg | ORAL_CAPSULE | Freq: Once | ORAL | Status: AC
  Administered 2021-02-21: 25 mg via ORAL

## 2021-02-21 MED ORDER — SODIUM CHLORIDE 0.9% FLUSH
10.0000 mL | Freq: Once | INTRAVENOUS | Status: AC
  Administered 2021-02-21: 10 mL
  Filled 2021-02-21: qty 10

## 2021-02-21 MED ORDER — ACETAMINOPHEN 325 MG PO TABS
ORAL_TABLET | ORAL | Status: AC
  Filled 2021-02-21: qty 2

## 2021-02-21 MED ORDER — TRASTUZUMAB-DKST CHEMO 150 MG IV SOLR
6.0000 mg/kg | Freq: Once | INTRAVENOUS | Status: AC
  Administered 2021-02-21: 336 mg via INTRAVENOUS
  Filled 2021-02-21: qty 16

## 2021-02-21 MED ORDER — SODIUM CHLORIDE 0.9 % IV SOLN
420.0000 mg | Freq: Once | INTRAVENOUS | Status: AC
  Administered 2021-02-21: 420 mg via INTRAVENOUS
  Filled 2021-02-21: qty 14

## 2021-02-21 NOTE — Progress Notes (Signed)
[place] CSW Progress Note  Holiday representative met with patient to provide ongoing support. Patient reports to be doing fairly well. She had her surgery and will have radiation appts coming up. She continues to receive strong support from her online support group.  Pt does have more paperwork to submit to [facility], but isn't sure if her deadline passed. CSW encouraged pt to submit it to [facility] so they can determine based on their criteria.  Patient did note that she is not sure what she will do once treatment is completed to readjust and is concerned she will be worried about recurrence. She experienced this after her daughter beat cancer as a child. CSW normalized concerns and began discussion on ways to manage fears as well as the resource of [facility] class.    Christeen Douglas , LCSW

## 2021-02-21 NOTE — Progress Notes (Signed)
Pt discharged in no apparent distress. Pt left ambulatory without assistance. Pt aware of discharge instructions and verbalized understanding and had no further questions.  

## 2021-02-21 NOTE — Patient Instructions (Signed)
[facility] Discharge Instructions for Patients Receiving Chemotherapy  Today you received the following chemotherapy agents: trastuzumab, pertuzumab To help prevent nausea and vomiting after your treatment, we encourage you to take your nausea medication as directed.   If you develop nausea and vomiting that is not controlled by your nausea medication, call the clinic.   BELOW ARE SYMPTOMS THAT SHOULD BE REPORTED IMMEDIATELY:  *FEVER GREATER THAN 100.5 F  *CHILLS WITH OR WITHOUT FEVER  NAUSEA AND VOMITING THAT IS NOT CONTROLLED WITH YOUR NAUSEA MEDICATION  *UNUSUAL SHORTNESS OF BREATH  *UNUSUAL BRUISING OR BLEEDING  TENDERNESS IN MOUTH AND THROAT WITH OR WITHOUT PRESENCE OF ULCERS  *URINARY PROBLEMS  *BOWEL PROBLEMS  UNUSUAL RASH Items with * indicate a potential emergency and should be followed up as soon as possible.  Feel free to call the clinic should you have any questions or concerns. The clinic phone number is [phone].  Please show the [place] at check-in to the Emergency Department and triage nurse.

## 2021-02-21 NOTE — Progress Notes (Addendum)
No Herceptin or Perjeta reload per Dr. Lindi Adie.  Amanda Rich, [place], BCPS, BCOP 02/21/2021 11:01 AM

## 2021-02-22 DIAGNOSIS — Z419 Encounter for procedure for purposes other than remedying health state, unspecified: Secondary | ICD-10-CM | POA: Diagnosis not present

## 2021-02-23 ENCOUNTER — Ambulatory Visit: Payer: Medicaid Other

## 2021-02-23 ENCOUNTER — Telehealth: Payer: Self-pay | Admitting: Hematology and Oncology

## 2021-02-23 NOTE — Telephone Encounter (Signed)
Scheduled per 2/28 los. Pt will receive an updated appt calendar per next visit appt notes

## 2021-03-02 ENCOUNTER — Encounter: Payer: Self-pay | Admitting: Radiation Oncology

## 2021-03-02 ENCOUNTER — Other Ambulatory Visit: Payer: Self-pay

## 2021-03-02 ENCOUNTER — Ambulatory Visit
Admission: RE | Admit: 2021-03-02 | Discharge: 2021-03-02 | Disposition: A | Discharge status: Home / Self Care | Payer: Medicaid Other | Source: Ambulatory Visit | Attending: Radiation Oncology | Admitting: Radiation Oncology

## 2021-03-02 VITALS — BP 98/68 | HR 72 | Resp 18 | Wt 125.1 lb

## 2021-03-02 DIAGNOSIS — C50211 Malignant neoplasm of upper-inner quadrant of right female breast: Secondary | ICD-10-CM

## 2021-03-02 DIAGNOSIS — Z79899 Other long term (current) drug therapy: Secondary | ICD-10-CM | POA: Insufficient documentation

## 2021-03-02 DIAGNOSIS — Z170 Estrogen receptor positive status [ER+]: Secondary | ICD-10-CM | POA: Insufficient documentation

## 2021-03-02 DIAGNOSIS — Z9221 Personal history of antineoplastic chemotherapy: Secondary | ICD-10-CM | POA: Diagnosis not present

## 2021-03-02 DIAGNOSIS — Z510 Encounter for antineoplastic radiation therapy: Secondary | ICD-10-CM | POA: Diagnosis not present

## 2021-03-02 DIAGNOSIS — G629 Polyneuropathy, unspecified: Secondary | ICD-10-CM | POA: Diagnosis not present

## 2021-03-02 NOTE — Progress Notes (Signed)
Location of Breast Cancer: Right Breast  Pathology : 2/9/20222 FINAL MICROSCOPIC DIAGNOSIS:   A. BREAST, RIGHT, LUMPECTOMY:  - Fibrosis and hemosiderin.  - No residual carcinoma identified.  - Margins free of tumor.  - Biopsy site and biopsy clip.   B. BREAST, RIGHT ADDITIONAL MEDIO-INFERIOR MARGIN, EXCISION:  - Benign breast tissue.  - No malignancy identified.  - Final medio-inferior margin free of tumor.   C. LYMPH NODE, RIGHT AXILLARY, SENTINEL, EXCISION:  - One lymph node with no metastatic carcinoma identified (0/1).   D. LYMPH NODE, RIGHT AXILLARY, SENTINEL, EXCISION:  - One lymph node with no metastatic carcinoma identified (0/1).   E. LYMPH NODE, RIGHT AXILLARY, SENTINEL, EXCISION:  - One lymph node with no metastatic carcinoma identified (0/1).   F. LYMPH NODE, RIGHT AXILLARY, SENTINEL, EXCISION:  - One lymph node with no metastatic carcinoma identified (0/1).    ONCOLOGY TABLE:  INVASIVE CARCINOMA OF THE BREAST, STATUS POST NEOADJUVANT TREATMENT:  Resection  Procedure: Right breast lumpectomy with additional margin and 4 sentinel  lymph nodes.  Specimen Laterality: Right breast.  Histologic Type: Ductal, see previous core biopsy report ([id number]).  No residual carcinoma in lumpectomy.  Histologic Grade:    Glandular (Acinar)/Tubular Differentiation: See [id number].    Nuclear Pleomorphism: See [id number].    Mitotic Rate: See [id number].    Overall Grade: 3,See [id number].  Tumor Size: No residual carcinoma in lumpectomy.  Ductal Carcinoma In Situ: N/A  Tumor Extent: N/A  Treatment Effect in the Breast: Present.  Treatment Effect in Lymph Nodes: Not identified.  Margins: All margins not involved by carcinoma.  Regional Lymph Nodes:    Number of Lymph Nodes Examined: 4    Number of Sentinel Nodes Examined: 4    Number of Lymph Nodes with Macrometastases (>2 mm): 0    Number of Lymph Nodes with Micrometastases: 0     Number of Lymph Nodes with Isolated Tumor Cells (=0.2 mm or =200  cells): 0  Distant Metastasis:    Distant Site(s) Involved: N/A  Breast Prognostic Profile (Pre-neoadjuvant case #: [id number])    Estrogen Receptor: 30%, positive, weak staining.    Progesterone Receptor: 0%, negative.    Her2: Equivocal with IHC. Positive with FISH, ratio 3.44.    Ki-67: 50%.    Will be not repeated on the current case as there is no residual  carcinoma..  Residual Cancer Burden (RCB):    Primary Tumor Bed: 28 mm x 16 mm    Overall Cancer Cellularity: 0    Percentage of Cancer that is in Situ: 0    Number of Positive Lymph Nodes: 0    Diameter of Largest Lymph Node metastasis: 0    Residual Cancer Burden: 0    Residual Cancer Burden Class: pCR  Pathologic Stage Classification (pTNM, AJCC 8th Edition): ypT0, ypN0  Representative Tumor Block: N/A  (v4.5.0.0)   Receptor Status: Estrogen Receptor: 30%, positive, weak staining.    Progesterone Receptor: 0%, negative.    Her2: Equivocal with IHC. Positive with FISH, ratio 3.44.    Ki-67: 50%.   Did patient present with symptoms (if so, please note symptoms) or was this found on screening mammography?:  Self exam  Past/Anticipated interventions by surgeon, if any:02/02/2021  Dr . [name]   RIGHT BREAST LUMPECTOMY WITH RADIOACTIVE SEED AND RIGHT SENTINEL LYMPH NODE MAPPING  Past/Anticipated interventions by medical oncology, if any: Chemotherapy   . Neoadjuvant chemotherapy with TCH Perjeta 6 cycles followed by Herceptin maintenance for 1  year 2. Followed by breast conserving surgery if possible with sentinel lymph node study 3. Followed by adjuvant radiation therapy if patient had lumpectomy 4.Followed by adjuvant antiestrogen therapy  Current treatment: Cycle4TCH Perjeta Echocardiogram 09/06/2020: EF 60 to 65% Lymphedema issues, if any:  Pain issues, if any:  None  SAFETY  ISSUES:  Prior radiation?  No  Pacemaker/ICD?  NO  Possible current pregnancy?No  Is the patient on methotrexate? NO  Current Complaints / other details:     Vitals:   03/02/21 1402  BP: 98/68  Pulse: 72  Resp: 18  SpO2: 100%  Weight: 56.8 kg    Sherrlyn Hock, RN 03/02/2021,8:53 AM

## 2021-03-02 NOTE — Progress Notes (Signed)
Radiation Oncology         [phone] ________________________________  Name: Amanda Rich MRN: [id number]  Date: 03/02/2021  DOB: 06/20/1966  Re-Evaluation Note  CC: Girtha Rm, NP-C  Nicholas Lose, MD    ICD-10-CM   1. Malignant neoplasm of 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, estrogen receptor positive ([place])  C50.211    Z17.0     Diagnosis:  Stage ypT0, ypN0, pM0, Right Breast UIQ, Invasive Ductal Carcinoma, ER+ / PR- / Her2+, Grade 3  Narrative:  The patient returns today to discuss radiation treatment options. She was seen in the multidisciplinary breast clinic on 09/01/2020, at which time it was recommended that she proceed with genetic testing, MRI, echocardiogram, chemotherapy class, neoadjuvant chemotherapy, right lumpectomy with sentinel lymph node biopsy, adjuvant radiation therapy, and aromatase inhibitor.  MRI of bilateral breasts on 09/08/2020 showed the biopsy-proven malignancy in the upper inner right breast that measured 3.0 x 2.2 x 3.0 cm. The mass abutted the pectoralis muscle with loss of the fat plane, however, there were no findings of muscle enhancement. There was no MRI evidence of left breast malignancy, nor was then any axillary lymphadenopathy.  The patient underwent six cycles of neoadjuvant chemotherapy with Devine Perjeta from 09/13/2020 to 01/18/2021 under the care of Dr. Lindi Adie. She tolerated treatment with the exception of nausea, vomiting, diarrhea, fatigue, lack of taste and appetite, elevated LFTs, chemotherapy-induced anemia, and elevated blood sugars.  MRI of bilateral breasts on 01/13/2021 showed treatment response with only 1 cm area of minimal residual enhancement (without discrete mass) at the site of known invasive ductal carcinoma, which measured 3.0 x 2.2 x 3.0 cm on above MRI. There were no new or suspicious areas of enhancement otherwise within either breast. There were no abnormal lymph nodes.  The patient underwent a  right breast lumpectomy with right axillary sentinel lymph node biopsy on 02/02/2021 under the care of Dr. Brantley Stage. Pathology from the procedure revealed fibrosis and hemosiderin. There was no residual carcinoma identified. Margins were free of tumor. Four right axillary sentinel lymph nodes were excised and all were negative for carcinoma.  On review of systems, the patient reports some neuropathy  in her left hand and right foot. She denies pain within the right breast area or axillary region.  She denies any swelling in her right arm or hand.  And any other symptoms.    Allergies:  is allergic to clarithromycin.  Meds: Current Outpatient Medications  Medication Sig Dispense Refill  . acetaminophen (TYLENOL) 500 MG tablet Take 500 mg by mouth every 6 (six) hours as needed for mild pain, fever or headache.    . Biotin 5000 MCG CAPS Take 5,000 mcg by mouth daily.     Marland Kitchen ibuprofen (ADVIL) 800 MG tablet Take 1 tablet (800 mg total) by mouth every 8 (eight) hours as needed. 30 tablet 0  . Melatonin 1 MG CHEW Chew by mouth.    . Multiple Vitamin (MULTIVITAMIN WITH MINERALS) TABS Take 1 tablet by mouth daily.    . nicotine (NICODERM CQ) 7 mg/24hr patch Place 1 patch (7 mg total) onto the skin daily. 28 patch 0  . Cholecalciferol (VITAMIN D3) 2000 units TABS Take 2,000 Units by mouth daily.  (Patient not taking: Reported on 03/02/2021)    . citalopram (CELEXA) 10 MG tablet Take 1 tablet (10 mg total) by mouth daily. (Patient not taking: Reported on 03/02/2021) 90 tablet 3  . guaiFENesin (MUCINEX) 600 MG 12 hr tablet Take  600 mg by mouth 2 (two) times daily as needed for cough. (Patient not taking: Reported on 03/02/2021)    . HYDROcodone-acetaminophen (NORCO/VICODIN) 5-325 MG tablet Take 1 tablet by mouth every 6 (six) hours as needed for moderate pain. (Patient not taking: Reported on 03/02/2021) 15 tablet 0  . loperamide (IMODIUM) 2 MG capsule Take 1 capsule (2 mg total) by mouth as needed for diarrhea or  loose stools. (Patient not taking: Reported on 03/02/2021) 30 capsule 0  . loratadine (CLARITIN) 10 MG tablet Take 1 tablet (10 mg total) by mouth daily. (Patient not taking: Reported on 03/02/2021)    . omeprazole (PRILOSEC) 20 MG capsule Take 1 capsule (20 mg total) by mouth daily. (Patient not taking: Reported on 03/02/2021)     No current facility-administered medications for this encounter.    Physical Findings: The patient is in no acute distress. Patient is alert and oriented.  weight is 125 lb 2 oz (56.8 kg). Her blood pressure is 98/68 and her pulse is 72. Her respiration is 18 and oxygen saturation is 100%.  No significant changes. Lungs are clear to auscultation bilaterally. Heart has regular rate and rhythm. No palpable cervical, supraclavicular, or axillary adenopathy. Abdomen soft, non-tender, normal bowel sounds. Left breast: no palpable mass, nipple discharge or bleeding. Right breast: Well-healed scar in the upper inner quadrant without signs of drainage or infection within the breast.  A separate axillary scar also is healed well without signs of drainage or infection.  Lab Findings: Lab Results  Component Value Date   WBC 5.3 02/21/2021   HGB 11.0 (L) 02/21/2021   HCT 34.0 (L) 02/21/2021   MCV 89.9 02/21/2021   PLT 185 02/21/2021    Radiographic Findings: NM Sentinel Node Inj-No Rpt (Breast)  Result Date: 02/02/2021 Sulfur colloid was injected by the nuclear medicine technologist for melanoma sentinel node.   MM Breast Surgical Specimen  Result Date: 02/02/2021 CLINICAL DATA:  Evaluate surgical specimen following excision of a right breast carcinoma after radioactive seed localization. EXAM: SPECIMEN RADIOGRAPH OF THE RIGHT BREAST COMPARISON:  Previous exam(s). FINDINGS: Status post excision of the right breast. The radioactive seed and biopsy marker clip are present, completely intact, and were marked for pathology. IMPRESSION: Specimen radiograph of the right breast.  Electronically Signed   By: Lajean Manes M.D.   On: 02/02/2021 10:21   [place] RT RADIOACTIVE SEED LOC  Result Date: 02/01/2021 CLINICAL DATA:  Patient presents for radioactive seed localization of a right breast carcinoma prior to surgical excision. EXAM: ULTRASOUND GUIDED RADIOACTIVE SEED LOCALIZATION OF THE RIGHT BREAST COMPARISON:  Previous exam(s). FINDINGS: Patient presents for radioactive seed localization prior to surgical excision. I met with the patient and we discussed the procedure of seed localization including benefits and alternatives. We discussed the high likelihood of a successful procedure. We discussed the risks of the procedure including infection, bleeding, tissue injury and further surgery. We discussed the low dose of radioactivity involved in the procedure. Informed, written consent was given. The usual time-out protocol was performed immediately prior to the procedure. Using ultrasound guidance, sterile technique, 1% lidocaine and an I-125 radioactive seed, the residual mass in the right breast at 12:30 o'clock, 5 cm the nipple, was localized using a inferolateral approach. The follow-up mammogram images confirm the seed in the expected location and were marked for Dr. Brantley Stage. Follow-up survey of the patient confirms presence of the radioactive seed. Order number of I-125 seed:  [id number]. Total activity:  4.158 millicuries reference Date: 01/13/2021  The patient tolerated the procedure well and was released from the [facility]. She was given instructions regarding seed removal. IMPRESSION: Radioactive seed localization of the right breast. No apparent complications. Electronically Signed   By: Lajean Manes M.D.   On: 02/01/2021 15:42   MM CLIP PLACEMENT RIGHT  Result Date: 02/01/2021 CLINICAL DATA:  Evaluate radioactive seed placement following ultrasound-guided placement to localize a right breast carcinoma prior to surgery. EXAM: DIAGNOSTIC RIGHT MAMMOGRAM POST ULTRASOUND GUIDED  PLACEMENT OF A RADIOACTIVE SEED. COMPARISON:  Previous exam(s). FINDINGS: Mammographic images were obtained following ultrasound guided placement of a radioactive seed in the right breast. The radioactive seed lies along the anterior margin of the residual right breast mass, directly adjacent to the ribbon shaped biopsy clip, in the posterior upper inner right breast. IMPRESSION: Well-positioned radioactive seed within the residual carcinoma in the posterior upper inner right breast directly adjacent to the ribbon shaped biopsy clip. There has been a significant interval response to neoadjuvant chemotherapy with a decrease in size of the right breast mass. Final Assessment: Post Procedure Mammograms for Marker Placement Electronically Signed   By: Lajean Manes M.D.   On: 02/01/2021 14:57    Impression: Stage ypT0, ypN0, pM0, Right Breast UIQ, Invasive Ductal Carcinoma, ER+ / PR- / Her2+, Grade 3  The patient has had an excellent response to her neoadjuvant chemotherapy with a complete pathologic response.  Given the size of her initial tumor and the tumor characteristics of still would recommend adjuvant radiation therapy to reduce the chances of recurrence within the right breast.  The patient would appear to be a good candidate for hypofractionated accelerated radiation therapy over approximately 4 weeks.  Plan:  Patient is scheduled for CT simulation later today.  Treatments to begin approximately a week.  Anticipate 20 radiation treatments.  Total time spent in this encounter was 35 minutes which included reviewing the patient's most recent chemotherapy, MRIs, lumpectomy, pathology report, follow-ups, physical examination, and documentation.  -----------------------------------  Blair Promise, PhD, MD  This document serves as a record of services personally performed by Gery Pray, MD. It was created on his behalf by Clerance Lav, a trained medical scribe. The creation of this record is based  on the scribe's personal observations and the provider's statements to them. This document has been checked and approved by the attending provider.

## 2021-03-03 ENCOUNTER — Other Ambulatory Visit: Payer: Self-pay | Admitting: Hematology and Oncology

## 2021-03-03 ENCOUNTER — Other Ambulatory Visit: Payer: Self-pay

## 2021-03-03 ENCOUNTER — Ambulatory Visit: Payer: Medicaid Other | Attending: Surgery | Admitting: Physical Therapy

## 2021-03-03 ENCOUNTER — Encounter: Payer: Self-pay | Admitting: *Deleted

## 2021-03-03 ENCOUNTER — Encounter: Payer: Self-pay | Admitting: Physical Therapy

## 2021-03-03 DIAGNOSIS — Z483 Aftercare following surgery for neoplasm: Secondary | ICD-10-CM

## 2021-03-03 DIAGNOSIS — R293 Abnormal posture: Secondary | ICD-10-CM | POA: Diagnosis not present

## 2021-03-03 DIAGNOSIS — C50211 Malignant neoplasm of upper-inner quadrant of right female breast: Secondary | ICD-10-CM | POA: Diagnosis not present

## 2021-03-03 DIAGNOSIS — Z170 Estrogen receptor positive status [ER+]: Secondary | ICD-10-CM | POA: Insufficient documentation

## 2021-03-03 MED ORDER — BIMATOPROST 0.03 % EX SOLN: 1.0000 "application " | Freq: Every day | CUTANEOUS | 12 refills | Status: DC

## 2021-03-03 MED ORDER — GABAPENTIN 300 MG PO CAPS: 300.0000 mg | ORAL_CAPSULE | Freq: Every day | ORAL | 3 refills | Status: DC

## 2021-03-03 NOTE — Patient Instructions (Addendum)
External Rotation: Side-Lying ([place])    Lie with neck supported, left elbow bent to 90, forearm across stomach. Raise forearm, keeping elbow at side. Repeat __10__ times per set. Do __2__ sets per session. Use __3-5__ lb weight.   Copyright  VHI. All rights reserved.  Resisted External Rotation: in Neutral - Bilateral    Sit or stand, tubing in both hands, elbows at sides, bent to 90, forearms forward. Pinch shoulder blades together and rotate forearms out. Keep elbows at sides. Repeat __10__ times per set. Do __2__ sets per session. Do __1__ sessions per day.  http://orth.exer.us/967   Copyright  VHI. All rights reserved.              [facility]         [address], [place] [address]         [phone]         Annia Friendly, PT, CLT   After Breast Cancer Class It is recommended you attend the ABC class to be educated on lymphedema risk reduction. This class is free of charge and lasts for 1 hour. It is a 1-time class.  You are scheduled for April 4th at 11:00. We will send you a link to join.  Scar massage You can begin gentle scar massage to both incisions using cocoa butter or coconut oil - a few minutes each day.   Home exercise Program Continue your walking program - at least 30 min/day and add the above 2 exercises to increase strength.   Follow up PT: It is recommended you return every 3 months for the first 3 years following surgery to be assessed on the SOZO machine for an L-Dex score. This helps prevent clinically significant lymphedema in 95% of patients. These follow up screens are 15 minute appointments that you are not billed for. You are scheduled for May 9th at 11:00 at [address].

## 2021-03-03 NOTE — Therapy (Signed)
[place], [place], [address] Phone: [phone]   Fax:  [phone]  Physical Therapy Treatment  Patient Details  Name: Amanda Rich MRN: [id number] Date of Birth: 1966/09/30 Referring Provider (PT): Dr. [name]   Encounter Date: 03/03/2021   PT End of Session - 03/03/21 0956    Visit Number 2    Number of Visits 2    PT Start Time 0910    PT Stop Time 0952    PT Time Calculation (min) 42 min    Activity Tolerance Patient tolerated treatment well           Past Medical History:  Diagnosis Date  . Anemia   . Cancer [facility])    breast cancer  . Depression   . Family history of breast cancer   . Family history of liver cancer   . Pericarditis   . Smoker   . Umbilical hernia     Past Surgical History:  Procedure Laterality Date  . ABLATION ON ENDOMETRIOSIS    . BREAST LUMPECTOMY WITH RADIOACTIVE SEED AND SENTINEL LYMPH NODE BIOPSY Right 02/02/2021   Procedure: RIGHT BREAST LUMPECTOMY WITH RADIOACTIVE SEED AND RIGHT SENTINEL LYMPH NODE MAPPING;  Surgeon: [name], MD;  Location: [facility];  Service: General;  Laterality: Right;  . GANGLION CYST EXCISION  2007   L wrist; [facility], [place]  . IR IMAGING GUIDED PORT INSERTION  09/10/2020  . TUBAL LIGATION     [facility]  . UMBILICAL HERNIA REPAIR  2005   [place]    There were no vitals filed for this visit.   Subjective Assessment - 03/03/21 0915    Subjective Patient reports she is here tfor a follow up appointment. She underwent chemotherapy from 09/13/2020 - 01/18/2021. Then she had a right lumpectomy and sentinel node biopsy (4 negative nodes) on 02/02/2021. She will continue with Herceptin and begin radiation 03/09/2021 followed by anti-estrogen therapy.    Pertinent History Patient was diagnosed on 08/17/2020 with right grade III invasive ductal carcinoma breast cancer. She underwent chemotherapy from 09/13/2020 - 01/18/2021.  Then she had a right lumpectomy and sentinel node biopsy (4 negative nodes) on 02/02/2021.  It is ER positive, PR negative, andHER2 positive with a Ki67 of 30%. She has a history of a heart attack in 12/2018.    Patient Stated Goals Get my arm back to normal    Currently in Pain? Yes    Pain Score 6     Pain Location Arm    Pain Orientation Right;Posterior;Upper    Pain Descriptors / Indicators Shooting;Sharp    Pain Type Surgical pain    Pain Onset 1 to 4 weeks ago    Pain Frequency Intermittent    Aggravating Factors  Reaching up    Pain Relieving Factors Nothing              [facility] PT Assessment - 03/03/21 0001      Assessment   Medical Diagnosis s/p right lumpectomy and SLNB    Referring Provider (PT) Dr. [name] [place]    Onset Date/Surgical Date 02/02/21    Hand Dominance Right    Prior Therapy Baselines      Precautions   Precautions Other (comment)    Precaution Comments recent surgery and right arm lymphedema risk      Restrictions   Weight Bearing Restrictions No      Balance Screen   Has the patient fallen in the past 6  months No    Has the patient had a decrease in activity level because of a fear of falling?  No    Is the patient reluctant to leave their home because of a fear of falling?  No      Home Social worker Private residence    Living Arrangements Alone    Available Help at Discharge Family      Prior Function   Level of Independence Independent    Vocation Unemployed    Leisure She has returned to lifting small weights and is walking every other day 1 mile      Cognition   Overall Cognitive Status Within Functional Limits for tasks assessed      Observation/Other Assessments   Observations Right axillary and breast incisions both appear to be healing very well with small scabs still present. No redness, edema or warmth noted.      Posture/Postural Control   Posture/Postural Control Postural limitations    Postural  Limitations Rounded Shoulders;Forward head      ROM / Strength   AROM / PROM / Strength AROM      AROM   AROM Assessment Site Shoulder    Right/Left Shoulder Right    Right Shoulder Extension 66 Degrees    Right Shoulder Flexion 156 Degrees    Right Shoulder ABduction 176 Degrees    Right Shoulder Internal Rotation 67 Degrees    Right Shoulder External Rotation 90 Degrees      Strength   Overall Strength Comments Right UE 5/5 except ER 4-/5             LYMPHEDEMA/ONCOLOGY QUESTIONNAIRE - 03/03/21 0001      Type   Cancer Type Right breast cancer      Surgeries   Lumpectomy Date 02/02/21    Sentinel Lymph Node Biopsy Date 02/02/21    Number Lymph Nodes Removed 4      Treatment   Active Chemotherapy Treatment No    Past Chemotherapy Treatment Yes    Date 01/18/21    Active Radiation Treatment No    Past Radiation Treatment No    Current Hormone Treatment No    Past Hormone Therapy No      What other symptoms do you have   Are you Having Heaviness or Tightness Yes    Are you having Pain Yes    Are you having pitting edema No    Is it Hard or Difficult finding clothes that fit No    Do you have infections No    Is there Decreased scar mobility No    Stemmer Sign No      Lymphedema Assessments   Lymphedema Assessments Upper extremities      Right Upper Extremity Lymphedema   10 cm Proximal to Olecranon Process 21.8 cm    Olecranon Process 21 cm    10 cm Proximal to Ulnar Styloid Process 18.6 cm    Just Proximal to Ulnar Styloid Process 14.4 cm    Across Hand at [employer] 18.4 cm    At [place] of 2nd Digit 6 cm      Left Upper Extremity Lymphedema   10 cm Proximal to Olecranon Process 22.4 cm    Olecranon Process 21.6 cm    10 cm Proximal to Ulnar Styloid Process 18.6 cm    Just Proximal to Ulnar Styloid Process 14.6 cm    Across Hand at [employer] 18.3 cm    At  Base of 2nd Digit 5.5 cm              Amanda Rich - 03/03/21 0001    Open a  tight or new jar No difficulty    Do heavy household chores (wash walls, wash floors) Moderate difficulty    Carry a shopping bag or briefcase Mild difficulty    Wash your back Moderate difficulty    Use a knife to cut food No difficulty    Recreational activities in which you take some force or impact through your arm, shoulder, or hand (golf, hammering, tennis) Mild difficulty    During the past week, to what extent has your arm, shoulder or hand problem interfered with your normal social activities with family, friends, neighbors, or groups? Modererately    During the past week, to what extent has your arm, shoulder or hand problem limited your work or other regular daily activities Modererately    Arm, shoulder, or hand pain. Moderate    Tingling (pins and needles) in your arm, shoulder, or hand Moderate    Difficulty Sleeping No difficulty    DASH Score 31.82 %                               PT Long Term Goals - 03/03/21 4665      PT LONG TERM GOAL #1   Title Patient will demonstrate she has regained full shoulder ROM and function post operatively compared to baselines.    Time 6    Period Months    Status Achieved                 Plan - 03/03/21 0956    Clinical Impression Statement Patient is doing well s/p right lumpectomy and sentinel node biopsy with 4 negative nodes on 02/02/2021. She is still recovering from chemotherapy with some c/o mild neuropathy in her hands and feet but that is being addressed with medication and she reports no functional deficits as a result. She has regained full shoulder ROM and function, shows no sign of lymphedema, and her incisions are healing well. She plans to attend the After Breast Cancer class on 03/28/2021 for lympehdema education but otherwise has no PT needs at this time.    PT Treatment/Interventions ADLs/Self Care Home Management;Therapeutic exercise;Patient/family education    PT Next Visit Plan D/C    PT Home  Exercise Plan ER strengthening    Consulted and Agree with Plan of Care Patient           Patient will benefit from skilled therapeutic intervention in order to improve the following deficits and impairments:  Postural dysfunction,Decreased range of motion,Decreased knowledge of precautions,Impaired UE functional use,Pain  Visit Diagnosis: Malignant neoplasm of 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, estrogen receptor positive ([place])  Abnormal posture  Aftercare following surgery for neoplasm     Problem List Patient Active Problem List   Diagnosis Date Noted  . Family history of breast cancer   . Family history of liver cancer   . Malignant neoplasm of 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, estrogen receptor positive ([place]) 08/25/2020  . Abnormal chest CT 01/30/2019  . History of pericarditis 01/30/2019  . Pericarditis 01/22/2019  . Chest pain 12/26/2018  . History of endometrial ablation 01/21/2018  . Family history of colon cancer 09/24/2017  . Poor diet 01/24/2017  . Weight loss, abnormal 01/24/2017  . Smoker 09/22/2016  . Mixed  hyperlipidemia 08/07/2016  . Irregular intermenstrual bleeding 08/07/2016  . Depression 08/07/2016  . Fat necrosis of peritoneum ([place]) 08/07/2016  . Pelvic congestion syndrome 08/07/2016   PHYSICAL THERAPY DISCHARGE SUMMARY  Visits from Start of Care: 2  Current functional level related to goals / functional outcomes: Goals met. See above for objective findings.   Remaining deficits: Mild neuropathy hands and feet   Education / Equipment: HEP and lymphedema education  Plan: Patient agrees to discharge.  Patient goals were met. Patient is being discharged due to meeting the stated rehab goals.  ?????     Annia Friendly, [place] 03/03/21 9:59 AM  [place], [place], [address] Phone: [phone]   Fax:  [phone]  Name: LANIYA FRIEDL MRN: [id number] Date of Birth: 10/14/1966

## 2021-03-07 ENCOUNTER — Telehealth: Payer: Self-pay | Admitting: Licensed Clinical Social Worker

## 2021-03-07 NOTE — Telephone Encounter (Signed)
[place] CSW Progress Note  Patient called CSW due to frustration with length of process of applying for disability and why it is not moving forward. She is also concerned that she will lose her house if she does not receive the assistance.  Patient has received assistance from [place], and mortgage deferral from her bank. She applied for [employer] but needed to submit her tax returns. She now has these so CSW again encouraged her to send them in. Regarding disability, recommended patient contact Cecille Rubin at [facility] as this who has been assisting with the application process.     Christeen Douglas , LCSW

## 2021-03-09 DIAGNOSIS — Z510 Encounter for antineoplastic radiation therapy: Secondary | ICD-10-CM | POA: Diagnosis not present

## 2021-03-09 DIAGNOSIS — C50211 Malignant neoplasm of upper-inner quadrant of right female breast: Secondary | ICD-10-CM | POA: Diagnosis not present

## 2021-03-09 DIAGNOSIS — Z170 Estrogen receptor positive status [ER+]: Secondary | ICD-10-CM | POA: Diagnosis not present

## 2021-03-10 ENCOUNTER — Other Ambulatory Visit: Payer: Self-pay

## 2021-03-10 ENCOUNTER — Ambulatory Visit
Admission: RE | Admit: 2021-03-10 | Discharge: 2021-03-10 | Disposition: A | Discharge status: Home / Self Care | Payer: Medicaid Other | Source: Ambulatory Visit | Attending: Radiation Oncology | Admitting: Radiation Oncology

## 2021-03-10 DIAGNOSIS — C50211 Malignant neoplasm of upper-inner quadrant of right female breast: Secondary | ICD-10-CM

## 2021-03-10 DIAGNOSIS — Z170 Estrogen receptor positive status [ER+]: Secondary | ICD-10-CM | POA: Diagnosis not present

## 2021-03-10 DIAGNOSIS — Z510 Encounter for antineoplastic radiation therapy: Secondary | ICD-10-CM | POA: Diagnosis not present

## 2021-03-11 ENCOUNTER — Other Ambulatory Visit: Payer: Self-pay

## 2021-03-11 ENCOUNTER — Ambulatory Visit
Admission: RE | Admit: 2021-03-11 | Discharge: 2021-03-11 | Disposition: A | Discharge status: Home / Self Care | Payer: Medicaid Other | Source: Ambulatory Visit | Attending: Radiation Oncology | Admitting: Radiation Oncology

## 2021-03-11 DIAGNOSIS — Z510 Encounter for antineoplastic radiation therapy: Secondary | ICD-10-CM | POA: Diagnosis not present

## 2021-03-11 DIAGNOSIS — C50211 Malignant neoplasm of upper-inner quadrant of right female breast: Secondary | ICD-10-CM

## 2021-03-11 DIAGNOSIS — Z170 Estrogen receptor positive status [ER+]: Secondary | ICD-10-CM

## 2021-03-11 MED ORDER — RADIAPLEXRX EX GEL: Freq: Once | CUTANEOUS | Status: AC

## 2021-03-14 ENCOUNTER — Ambulatory Visit
Admission: RE | Admit: 2021-03-14 | Discharge: 2021-03-14 | Disposition: A | Discharge status: Home / Self Care | Payer: Medicaid Other | Source: Ambulatory Visit | Attending: Radiation Oncology | Admitting: Radiation Oncology

## 2021-03-14 ENCOUNTER — Inpatient Hospital Stay: Payer: Medicaid Other | Attending: Hematology and Oncology

## 2021-03-14 ENCOUNTER — Other Ambulatory Visit: Payer: Self-pay

## 2021-03-14 VITALS — BP 127/75 | HR 66 | Temp 97.6°F | Resp 16

## 2021-03-14 DIAGNOSIS — Z79899 Other long term (current) drug therapy: Secondary | ICD-10-CM | POA: Insufficient documentation

## 2021-03-14 DIAGNOSIS — Z510 Encounter for antineoplastic radiation therapy: Secondary | ICD-10-CM | POA: Diagnosis not present

## 2021-03-14 DIAGNOSIS — Z170 Estrogen receptor positive status [ER+]: Secondary | ICD-10-CM | POA: Insufficient documentation

## 2021-03-14 DIAGNOSIS — C50211 Malignant neoplasm of upper-inner quadrant of right female breast: Secondary | ICD-10-CM | POA: Diagnosis not present

## 2021-03-14 DIAGNOSIS — Z5112 Encounter for antineoplastic immunotherapy: Secondary | ICD-10-CM | POA: Insufficient documentation

## 2021-03-14 MED ORDER — TRASTUZUMAB-DKST CHEMO 150 MG IV SOLR
6.0000 mg/kg | Freq: Once | INTRAVENOUS | Status: AC
  Administered 2021-03-14: 336 mg via INTRAVENOUS
  Filled 2021-03-14: qty 16

## 2021-03-14 MED ORDER — SODIUM CHLORIDE 0.9 % IV SOLN
Freq: Once | INTRAVENOUS | Status: AC
  Filled 2021-03-14: qty 250

## 2021-03-14 MED ORDER — ACETAMINOPHEN 325 MG PO TABS
650.0000 mg | ORAL_TABLET | Freq: Once | ORAL | Status: AC
  Administered 2021-03-14: 650 mg via ORAL

## 2021-03-14 MED ORDER — SODIUM CHLORIDE 0.9 % IV SOLN
420.0000 mg | Freq: Once | INTRAVENOUS | Status: AC
  Administered 2021-03-14: 420 mg via INTRAVENOUS
  Filled 2021-03-14: qty 14

## 2021-03-14 MED ORDER — DIPHENHYDRAMINE HCL 25 MG PO CAPS
ORAL_CAPSULE | ORAL | Status: AC
  Filled 2021-03-14: qty 1

## 2021-03-14 MED ORDER — HEPARIN SOD (PORK) LOCK FLUSH 100 UNIT/ML IV SOLN
500.0000 [IU] | Freq: Once | INTRAVENOUS | Status: AC | PRN
  Administered 2021-03-14: 500 [IU]
  Filled 2021-03-14: qty 5

## 2021-03-14 MED ORDER — DIPHENHYDRAMINE HCL 25 MG PO CAPS
25.0000 mg | ORAL_CAPSULE | Freq: Once | ORAL | Status: AC
  Administered 2021-03-14: 25 mg via ORAL

## 2021-03-14 MED ORDER — ACETAMINOPHEN 325 MG PO TABS
ORAL_TABLET | ORAL | Status: AC
  Filled 2021-03-14: qty 2

## 2021-03-14 MED ORDER — SODIUM CHLORIDE 0.9% FLUSH
10.0000 mL | INTRAVENOUS | Status: DC | PRN
  Administered 2021-03-14: 10 mL
  Filled 2021-03-14: qty 10

## 2021-03-14 NOTE — Patient Instructions (Signed)
[facility] Discharge Instructions for Patients Receiving Chemotherapy Today you received the following chemotherapy agents: Ogivri, Perjeta   To help prevent nausea and vomiting after your treatment, we encourage you to take your nausea medication as directed.    If you develop nausea and vomiting that is not controlled by your nausea medication, call the clinic.   BELOW ARE SYMPTOMS THAT SHOULD BE REPORTED IMMEDIATELY:  *FEVER GREATER THAN 100.5 F  *CHILLS WITH OR WITHOUT FEVER  NAUSEA AND VOMITING THAT IS NOT CONTROLLED WITH YOUR NAUSEA MEDICATION  *UNUSUAL SHORTNESS OF BREATH  *UNUSUAL BRUISING OR BLEEDING  TENDERNESS IN MOUTH AND THROAT WITH OR WITHOUT PRESENCE OF ULCERS  *URINARY PROBLEMS  *BOWEL PROBLEMS  UNUSUAL RASH Items with * indicate a potential emergency and should be followed up as soon as possible.  Feel free to call the clinic should you have any questions or concerns. The clinic phone number is [phone].  Please show the CHEMO ALERT CARD at check-in to the Emergency Department and triage nurse.  

## 2021-03-15 ENCOUNTER — Other Ambulatory Visit: Payer: Self-pay

## 2021-03-15 ENCOUNTER — Ambulatory Visit
Admission: RE | Admit: 2021-03-15 | Discharge: 2021-03-15 | Disposition: A | Discharge status: Home / Self Care | Payer: Medicaid Other | Source: Ambulatory Visit | Attending: Radiation Oncology | Admitting: Radiation Oncology

## 2021-03-15 DIAGNOSIS — Z170 Estrogen receptor positive status [ER+]: Secondary | ICD-10-CM | POA: Diagnosis not present

## 2021-03-15 DIAGNOSIS — C50211 Malignant neoplasm of upper-inner quadrant of right female breast: Secondary | ICD-10-CM | POA: Diagnosis not present

## 2021-03-15 DIAGNOSIS — Z510 Encounter for antineoplastic radiation therapy: Secondary | ICD-10-CM | POA: Diagnosis not present

## 2021-03-15 MED ORDER — ALRA NON-METALLIC DEODORANT (RAD-ONC)
1.0000 "application " | Freq: Once | TOPICAL | Status: AC
  Administered 2021-03-15: 1 via TOPICAL

## 2021-03-15 MED ORDER — RADIAPLEXRX EX GEL: Freq: Once | CUTANEOUS | Status: AC

## 2021-03-16 ENCOUNTER — Ambulatory Visit: Payer: Medicaid Other

## 2021-03-17 ENCOUNTER — Ambulatory Visit
Admission: RE | Admit: 2021-03-17 | Discharge: 2021-03-17 | Disposition: A | Discharge status: Home / Self Care | Payer: Medicaid Other | Source: Ambulatory Visit | Attending: Radiation Oncology | Admitting: Radiation Oncology

## 2021-03-17 ENCOUNTER — Other Ambulatory Visit: Payer: Self-pay

## 2021-03-17 DIAGNOSIS — Z510 Encounter for antineoplastic radiation therapy: Secondary | ICD-10-CM | POA: Diagnosis not present

## 2021-03-17 DIAGNOSIS — C50211 Malignant neoplasm of upper-inner quadrant of right female breast: Secondary | ICD-10-CM | POA: Diagnosis not present

## 2021-03-17 DIAGNOSIS — Z170 Estrogen receptor positive status [ER+]: Secondary | ICD-10-CM | POA: Diagnosis not present

## 2021-03-18 ENCOUNTER — Ambulatory Visit: Payer: Medicaid Other

## 2021-03-20 ENCOUNTER — Other Ambulatory Visit: Payer: Self-pay

## 2021-03-20 ENCOUNTER — Emergency Department (HOSPITAL_COMMUNITY)
Admission: EM | Admit: 2021-03-20 | Discharge: 2021-03-21 | Disposition: A | Discharge status: Home / Self Care | Payer: Medicaid Other | Attending: Emergency Medicine | Admitting: Emergency Medicine

## 2021-03-20 DIAGNOSIS — Z5321 Procedure and treatment not carried out due to patient leaving prior to being seen by health care provider: Secondary | ICD-10-CM | POA: Diagnosis not present

## 2021-03-20 DIAGNOSIS — C801 Malignant (primary) neoplasm, unspecified: Secondary | ICD-10-CM | POA: Insufficient documentation

## 2021-03-20 NOTE — ED Notes (Signed)
Pt leaving AMA, advised to return if symptoms worsen. Stated she will be seen by doctor this week.

## 2021-03-21 ENCOUNTER — Other Ambulatory Visit: Payer: Self-pay

## 2021-03-21 ENCOUNTER — Ambulatory Visit
Admission: RE | Admit: 2021-03-21 | Discharge: 2021-03-21 | Disposition: A | Discharge status: Home / Self Care | Payer: Medicaid Other | Source: Ambulatory Visit | Attending: Radiation Oncology | Admitting: Radiation Oncology

## 2021-03-21 DIAGNOSIS — Z510 Encounter for antineoplastic radiation therapy: Secondary | ICD-10-CM | POA: Diagnosis not present

## 2021-03-21 DIAGNOSIS — C50211 Malignant neoplasm of upper-inner quadrant of right female breast: Secondary | ICD-10-CM | POA: Diagnosis not present

## 2021-03-21 DIAGNOSIS — Z170 Estrogen receptor positive status [ER+]: Secondary | ICD-10-CM | POA: Diagnosis not present

## 2021-03-22 ENCOUNTER — Ambulatory Visit: Payer: Medicaid Other | Admitting: Radiation Oncology

## 2021-03-22 ENCOUNTER — Ambulatory Visit
Admission: RE | Admit: 2021-03-22 | Discharge: 2021-03-22 | Disposition: A | Discharge status: Home / Self Care | Payer: Medicaid Other | Source: Ambulatory Visit | Attending: Radiation Oncology | Admitting: Radiation Oncology

## 2021-03-22 DIAGNOSIS — C50211 Malignant neoplasm of upper-inner quadrant of right female breast: Secondary | ICD-10-CM | POA: Diagnosis not present

## 2021-03-22 DIAGNOSIS — Z510 Encounter for antineoplastic radiation therapy: Secondary | ICD-10-CM | POA: Diagnosis not present

## 2021-03-22 DIAGNOSIS — Z170 Estrogen receptor positive status [ER+]: Secondary | ICD-10-CM | POA: Diagnosis not present

## 2021-03-23 ENCOUNTER — Ambulatory Visit
Admission: RE | Admit: 2021-03-23 | Discharge: 2021-03-23 | Disposition: A | Discharge status: Home / Self Care | Payer: Medicaid Other | Source: Ambulatory Visit | Attending: Radiation Oncology | Admitting: Radiation Oncology

## 2021-03-23 ENCOUNTER — Other Ambulatory Visit: Payer: Self-pay

## 2021-03-23 DIAGNOSIS — Z510 Encounter for antineoplastic radiation therapy: Secondary | ICD-10-CM | POA: Diagnosis not present

## 2021-03-23 DIAGNOSIS — C50211 Malignant neoplasm of upper-inner quadrant of right female breast: Secondary | ICD-10-CM | POA: Diagnosis not present

## 2021-03-23 DIAGNOSIS — Z170 Estrogen receptor positive status [ER+]: Secondary | ICD-10-CM | POA: Diagnosis not present

## 2021-03-24 ENCOUNTER — Ambulatory Visit
Admission: RE | Admit: 2021-03-24 | Discharge: 2021-03-24 | Disposition: A | Discharge status: Home / Self Care | Payer: Medicaid Other | Source: Ambulatory Visit | Attending: Radiation Oncology | Admitting: Radiation Oncology

## 2021-03-24 DIAGNOSIS — Z510 Encounter for antineoplastic radiation therapy: Secondary | ICD-10-CM | POA: Diagnosis not present

## 2021-03-24 DIAGNOSIS — C50211 Malignant neoplasm of upper-inner quadrant of right female breast: Secondary | ICD-10-CM | POA: Diagnosis not present

## 2021-03-24 DIAGNOSIS — Z170 Estrogen receptor positive status [ER+]: Secondary | ICD-10-CM | POA: Diagnosis not present

## 2021-03-25 ENCOUNTER — Other Ambulatory Visit: Payer: Self-pay

## 2021-03-25 ENCOUNTER — Ambulatory Visit
Admission: RE | Admit: 2021-03-25 | Discharge: 2021-03-25 | Disposition: A | Discharge status: Home / Self Care | Payer: Medicaid Other | Source: Ambulatory Visit | Attending: Radiation Oncology | Admitting: Radiation Oncology

## 2021-03-25 DIAGNOSIS — Z170 Estrogen receptor positive status [ER+]: Secondary | ICD-10-CM | POA: Insufficient documentation

## 2021-03-25 DIAGNOSIS — Z419 Encounter for procedure for purposes other than remedying health state, unspecified: Secondary | ICD-10-CM | POA: Diagnosis not present

## 2021-03-25 DIAGNOSIS — Z5112 Encounter for antineoplastic immunotherapy: Secondary | ICD-10-CM | POA: Insufficient documentation

## 2021-03-25 DIAGNOSIS — Z510 Encounter for antineoplastic radiation therapy: Secondary | ICD-10-CM | POA: Insufficient documentation

## 2021-03-25 DIAGNOSIS — C50211 Malignant neoplasm of upper-inner quadrant of right female breast: Secondary | ICD-10-CM | POA: Diagnosis not present

## 2021-03-28 ENCOUNTER — Ambulatory Visit
Admission: RE | Admit: 2021-03-28 | Discharge: 2021-03-28 | Disposition: A | Discharge status: Home / Self Care | Payer: Medicaid Other | Source: Ambulatory Visit | Attending: Radiation Oncology | Admitting: Radiation Oncology

## 2021-03-28 ENCOUNTER — Other Ambulatory Visit: Payer: Self-pay

## 2021-03-28 DIAGNOSIS — Z5112 Encounter for antineoplastic immunotherapy: Secondary | ICD-10-CM | POA: Diagnosis not present

## 2021-03-28 DIAGNOSIS — Z510 Encounter for antineoplastic radiation therapy: Secondary | ICD-10-CM | POA: Diagnosis not present

## 2021-03-28 DIAGNOSIS — C50211 Malignant neoplasm of upper-inner quadrant of right female breast: Secondary | ICD-10-CM | POA: Diagnosis not present

## 2021-03-28 DIAGNOSIS — Z170 Estrogen receptor positive status [ER+]: Secondary | ICD-10-CM | POA: Diagnosis not present

## 2021-03-29 ENCOUNTER — Ambulatory Visit: Payer: Medicaid Other | Admitting: Radiation Oncology

## 2021-03-29 ENCOUNTER — Ambulatory Visit
Admission: RE | Admit: 2021-03-29 | Discharge: 2021-03-29 | Disposition: A | Discharge status: Home / Self Care | Payer: Medicaid Other | Source: Ambulatory Visit | Attending: Radiation Oncology | Admitting: Radiation Oncology

## 2021-03-29 DIAGNOSIS — Z5112 Encounter for antineoplastic immunotherapy: Secondary | ICD-10-CM | POA: Diagnosis not present

## 2021-03-29 DIAGNOSIS — Z170 Estrogen receptor positive status [ER+]: Secondary | ICD-10-CM | POA: Diagnosis not present

## 2021-03-29 DIAGNOSIS — C50211 Malignant neoplasm of upper-inner quadrant of right female breast: Secondary | ICD-10-CM | POA: Diagnosis not present

## 2021-03-29 DIAGNOSIS — Z510 Encounter for antineoplastic radiation therapy: Secondary | ICD-10-CM | POA: Diagnosis not present

## 2021-03-30 ENCOUNTER — Other Ambulatory Visit: Payer: Self-pay

## 2021-03-30 ENCOUNTER — Ambulatory Visit
Admission: RE | Admit: 2021-03-30 | Discharge: 2021-03-30 | Disposition: A | Discharge status: Home / Self Care | Payer: Medicaid Other | Source: Ambulatory Visit | Attending: Radiation Oncology | Admitting: Radiation Oncology

## 2021-03-30 DIAGNOSIS — Z170 Estrogen receptor positive status [ER+]: Secondary | ICD-10-CM | POA: Diagnosis not present

## 2021-03-30 DIAGNOSIS — Z510 Encounter for antineoplastic radiation therapy: Secondary | ICD-10-CM | POA: Diagnosis not present

## 2021-03-30 DIAGNOSIS — C50211 Malignant neoplasm of upper-inner quadrant of right female breast: Secondary | ICD-10-CM | POA: Diagnosis not present

## 2021-03-30 DIAGNOSIS — Z5112 Encounter for antineoplastic immunotherapy: Secondary | ICD-10-CM | POA: Diagnosis not present

## 2021-03-31 ENCOUNTER — Ambulatory Visit
Admission: RE | Admit: 2021-03-31 | Discharge: 2021-03-31 | Disposition: A | Discharge status: Home / Self Care | Payer: Medicaid Other | Source: Ambulatory Visit | Attending: Radiation Oncology | Admitting: Radiation Oncology

## 2021-03-31 DIAGNOSIS — C50211 Malignant neoplasm of upper-inner quadrant of right female breast: Secondary | ICD-10-CM | POA: Diagnosis not present

## 2021-03-31 DIAGNOSIS — Z170 Estrogen receptor positive status [ER+]: Secondary | ICD-10-CM | POA: Diagnosis not present

## 2021-03-31 DIAGNOSIS — Z510 Encounter for antineoplastic radiation therapy: Secondary | ICD-10-CM | POA: Diagnosis not present

## 2021-03-31 DIAGNOSIS — Z5112 Encounter for antineoplastic immunotherapy: Secondary | ICD-10-CM | POA: Diagnosis not present

## 2021-04-01 ENCOUNTER — Ambulatory Visit
Admission: RE | Admit: 2021-04-01 | Discharge: 2021-04-01 | Disposition: A | Discharge status: Home / Self Care | Payer: Medicaid Other | Source: Ambulatory Visit | Attending: Radiation Oncology | Admitting: Radiation Oncology

## 2021-04-01 ENCOUNTER — Other Ambulatory Visit: Payer: Self-pay

## 2021-04-01 DIAGNOSIS — Z510 Encounter for antineoplastic radiation therapy: Secondary | ICD-10-CM | POA: Diagnosis not present

## 2021-04-01 DIAGNOSIS — Z170 Estrogen receptor positive status [ER+]: Secondary | ICD-10-CM

## 2021-04-01 DIAGNOSIS — Z5112 Encounter for antineoplastic immunotherapy: Secondary | ICD-10-CM | POA: Diagnosis not present

## 2021-04-01 DIAGNOSIS — C50211 Malignant neoplasm of upper-inner quadrant of right female breast: Secondary | ICD-10-CM

## 2021-04-01 MED ORDER — RADIAPLEXRX EX GEL: Freq: Once | CUTANEOUS | Status: AC

## 2021-04-03 NOTE — Assessment & Plan Note (Addendum)
Patient palpated a right breast lump x30yrand a left breast lump x212yrDiagnostic mammogram and USKoreahowed in the right breast, a 2.7cm mass 5cm from the nipple and 0.8cm mass 1cm from the nipple at the 12:30 position, with borderline cortical thickening in the right axilla, and in the left breast, a 4.0cm mass at the 11 o'clock position representing a hamartoma. 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  Treatment plan: 1. Neoadjuvant chemotherapy with TCH Perjeta 6 cycles completed 01/18/2021 followed by Herceptin Perjeta maintenance for 1 year 2. Followed by breast conserving surgery if possible with sentinel lymph node study 02/02/2021: Complete pathologic response, 0/4 lymph nodes negative 3. Followed by adjuvant radiation therapy 03/14/21- 04/08/21 4.Followed by adjuvant antiestrogen therapy ------------------------------------------------------------------------------------------------------------------------------------------- Current treatment: Maintenance Herceptin and Perjeta Echocardiogram 09/06/2020: EF 60 to 65%  Toxicities:  Mild intermittent diarrhea: Mild hypokalemia: She will take potassium containing foods.   Monitoring closely for toxicities. Return to clinic every 3 weeks for Herceptin and Perjeta.

## 2021-04-03 NOTE — Progress Notes (Signed)
Patient Care Team: Girtha Rm, NP-C as PCP - General (Family Medicine) Sueanne Margarita, MD as PCP - Cardiology (Cardiology) Rockwell Germany, RN as Oncology Nurse Navigator [name], RN as Oncology Nurse Navigator [name], MD as Consulting Physician (General Surgery) Nicholas Lose, MD as Consulting Physician (Hematology and Oncology) Gery Pray, MD as Consulting Physician (Radiation Oncology)  DIAGNOSIS:    ICD-10-CM   1. Malignant neoplasm of 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, estrogen receptor positive ([place])  C50.211    Z17.0     SUMMARY OF ONCOLOGIC HISTORY: Oncology History  Malignant neoplasm of 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, estrogen receptor positive ([place])  08/25/2020 Initial Diagnosis   Patient palpated a right breast lump x78yrand a left breast lump x244yrDiagnostic mammogram and USKoreahowed in the right breast, a 2.7cm mass 5cm from the nipple and 0.8cm mass 1cm from the nipple at the 12:30 position, with borderline cortical thickening in the right axilla, and in the left breast, a 4.0cm mass at the 11 o'clock position representing a hamartoma. 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   09/13/2020 - 01/18/2021 Chemotherapy   [facility]       02/02/2021 Surgery   Right lumpectomy ([facility]): no residual carcinoma, 4 right axillary lymph nodes negative for carcinoma.   02/21/2021 -  Chemotherapy      Patient is on Antibody Plan: BREAST TRASTUZUMAB + PERTUZUMAB Q21D    03/11/2021 -  Radiation Therapy   Adjuvant radiation     CHIEF COMPLIANT: Follow-up of right breast cancer  INTERVAL HISTORY: Amanda SANTELLANs a [age].o. with above-mentioned history of right breast cancerwho completedneoadjuvant chemotherapy, underwent a right lumpectomy, and  is currently on radiation. She presents today for follow-up.  Her major complaint is tingling of her fingers and toes especially if she puts the legs or arms in certain positions like crossing her legs.  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  ALLERGIES:  is allergic to clarithromycin.  MEDICATIONS:  Current Outpatient Medications  Medication Sig Dispense Refill  . acetaminophen (TYLENOL) 500 MG tablet Take 500 mg by mouth every 6 (six) hours as needed for mild pain, fever or headache.    . bimatoprost (LATISSE) 0.03 % ophthalmic solution PLACE 1 DROP ON APPLICATOR AND APPLY EVENLY ALONG THE UPPER EYELID AT BASE OF EYELASHES DAILY. USE CLEAN APPLICATOR FOR EACH EYE 5 mL 12  . Biotin 5000 MCG CAPS Take 5,000 mcg by mouth daily.     . Cholecalciferol (VITAMIN D3) 2000 units TABS Take 2,000 Units by mouth daily.  (Patient not taking: Reported on 03/02/2021)    . citalopram (CELEXA) 10 MG tablet Take 1 tablet (10 mg total) by mouth daily. (Patient not taking: Reported on 03/02/2021) 90 tablet 3  . gabapentin (NEURONTIN) 300 MG capsule TAKE 1 CAPSULE BY MOUTH AT BEDTIME 90 capsule 3  . guaiFENesin (MUCINEX) 600 MG 12 hr tablet Take 600 mg by mouth 2 (two) times daily as needed for cough. (Patient not taking: Reported on 03/02/2021)    . HYDROcodone-acetaminophen (NORCO/VICODIN) 5-325 MG tablet TAKE 1 TABLET BY MOUTH EVERY 6 HOURS AS NEEDED FOR MODERATE PAIN (Patient not taking: Reported on 03/02/2021) 15 tablet 0  . ibuprofen (ADVIL) 800 MG tablet TAKE 1 TABLET BY MOUTH EVERY  8 HOURS AS NEEDED 30 tablet 0  . loperamide (IMODIUM) 2 MG capsule Take 1 capsule (2 mg total) by mouth as needed for diarrhea or loose stools. (Patient not taking: Reported on 03/02/2021) 30 capsule 0  . loratadine (CLARITIN) 10 MG tablet Take 1 tablet (10 mg total) by mouth daily. (Patient not taking: Reported on 03/02/2021)    . Melatonin 1 MG CHEW Chew by mouth.    . Multiple Vitamin (MULTIVITAMIN WITH MINERALS) TABS Take 1 tablet  by mouth daily.    . nicotine (NICODERM CQ - DOSED IN MG/24 HR) 7 mg/24hr patch PLACE 1 PATCH ONTO THE SKIN DAILY. 28 patch 0  . omeprazole (PRILOSEC) 20 MG capsule Take 1 capsule (20 mg total) by mouth daily. (Patient not taking: Reported on 03/02/2021)     No current facility-administered medications for this visit.    PHYSICAL EXAMINATION: ECOG PERFORMANCE STATUS: 1 - Symptomatic but completely ambulatory  Vitals:   04/04/21 0955  BP: 123/85  Pulse: 72  Resp: 18  Temp: (!) 97.3 F (36.3 C)  SpO2: 100%   Filed Weights   04/04/21 0955  Weight: 124 lb 1.6 oz (56.3 kg)    LABORATORY DATA:  I have reviewed the data as listed CMP Latest Ref Rng & Units 04/04/2021 02/21/2021 01/31/2021  Glucose 70 - 99 mg/dL 89 109(H) 92  BUN 6 - 20 mg/dL 10 9 10   Creatinine 0.44 - 1.00 mg/dL 0.64 0.69 0.63  Sodium 135 - 145 mmol/L 142 140 140  Potassium 3.5 - 5.1 mmol/L 3.7 3.3(L) 3.6  Chloride 98 - 111 mmol/L 109 108 108  CO2 22 - 32 mmol/L 24 24 25   Calcium 8.9 - 10.3 mg/dL 9.0 9.2 8.9  Total Protein 6.5 - 8.1 g/dL 6.7 6.9 6.5  Total Bilirubin 0.3 - 1.2 mg/dL 0.5 0.5 0.3  Alkaline Phos 38 - 126 U/L 69 79 75  AST 15 - 41 U/L 16 19 20   ALT 0 - 44 U/L 10 14 16     Lab Results  Component Value Date   WBC 3.5 (L) 04/04/2021   HGB 11.1 (L) 04/04/2021   HCT 33.4 (L) 04/04/2021   MCV 87.0 04/04/2021   PLT 168 04/04/2021   NEUTROABS 1.7 04/04/2021    ASSESSMENT & PLAN:  Malignant neoplasm of 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, estrogen receptor positive ([place]) Patient palpated a right breast lump x40yrand a left breast lump x214yrDiagnostic mammogram and US[place]ore[place]owed in the right breast, a 2.7cm mass 5cm from the nipple and 0.8cm mass 1cm from the nipple at the 12:30 position, with borderline cortical thickening in the right axilla, and in the left breast, a 4.0cm mass at the 11 o'clock position representing a hamartoma. Right breast biopsy showed IDC at the 12:30 position, HER-2  equivocal by IHC, positive by FISH, ER+ 30% weak, PR- 0%, Ki67 50%, and benign findings 1cm from the nipple and in the axilla.  Treatment plan: 1. Neoadjuvant chemotherapy with TCH Perjeta 6 cycles completed 01/18/2021 followed by Herceptin Perjeta maintenance for 1 year 2. Followed by breast conserving surgery if possible with sentinel lymph node study 02/02/2021: Complete pathologic response, 0/4 lymph nodes negative 3. Followed by adjuvant radiation therapy 03/14/21- 04/08/21 4.Followed by adjuvant antiestrogen therapy ------------------------------------------------------------------------------------------------------------------------------------------- Current treatment: Maintenance Herceptin and Perjeta until 08/29/2021 Echocardiogram 09/06/2020: EF 60 to 65%  Toxicities:  Mild intermittent diarrhea: Mild hypokalemia: on potassium containing foods.  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  It is causing burning. Neuropathy: When she crosses her feet or puts her extremity in a or position it causes numbness of the extremity.  Monitoring closely for toxicities. Return to clinic every 3 weeks for Herceptin and Perjeta.    No orders of the defined types were placed in this encounter.  The patient has a good understanding of the overall plan. she agrees with it. she will call with any problems that may develop before the next visit here.  Total time spent: 30 mins including face to face time and time spent for planning, charting and coordination of care  Rulon Eisenmenger, MD, MPH 04/04/2021  I, Molly Dorshimer, am acting as scribe for Dr. Nicholas Lose.  I have reviewed the above documentation for accuracy and completeness, and I agree with the above.

## 2021-04-04 ENCOUNTER — Ambulatory Visit: Payer: Medicaid Other

## 2021-04-04 ENCOUNTER — Ambulatory Visit
Admission: RE | Admit: 2021-04-04 | Discharge: 2021-04-04 | Disposition: A | Discharge status: Home / Self Care | Payer: Medicaid Other | Source: Ambulatory Visit | Attending: Radiation Oncology | Admitting: Radiation Oncology

## 2021-04-04 ENCOUNTER — Inpatient Hospital Stay: Payer: Medicaid Other

## 2021-04-04 ENCOUNTER — Other Ambulatory Visit: Payer: Self-pay

## 2021-04-04 ENCOUNTER — Inpatient Hospital Stay (HOSPITAL_BASED_OUTPATIENT_CLINIC_OR_DEPARTMENT_OTHER): Payer: Medicaid Other | Admitting: Hematology and Oncology

## 2021-04-04 DIAGNOSIS — Z510 Encounter for antineoplastic radiation therapy: Secondary | ICD-10-CM | POA: Diagnosis not present

## 2021-04-04 DIAGNOSIS — Z170 Estrogen receptor positive status [ER+]: Secondary | ICD-10-CM

## 2021-04-04 DIAGNOSIS — C50211 Malignant neoplasm of upper-inner quadrant of right female breast: Secondary | ICD-10-CM | POA: Diagnosis not present

## 2021-04-04 DIAGNOSIS — Z5112 Encounter for antineoplastic immunotherapy: Secondary | ICD-10-CM | POA: Insufficient documentation

## 2021-04-04 DIAGNOSIS — Z79899 Other long term (current) drug therapy: Secondary | ICD-10-CM | POA: Insufficient documentation

## 2021-04-04 LAB — CBC WITH DIFFERENTIAL (CANCER CENTER ONLY)
Abs Immature Granulocytes: 0 10*3/uL (ref 0.00–0.07)
Basophils Absolute: 0 10*3/uL (ref 0.0–0.1)
Basophils Relative: 1 %
Eosinophils Absolute: 0.2 10*3/uL (ref 0.0–0.5)
Eosinophils Relative: 6 %
HCT: 33.4 % — ABNORMAL LOW (ref 36.0–46.0)
Hemoglobin: 11.1 g/dL — ABNORMAL LOW (ref 12.0–15.0)
Immature Granulocytes: 0 %
Lymphocytes Relative: 37 %
Lymphs Abs: 1.3 10*3/uL (ref 0.7–4.0)
MCH: 28.9 pg (ref 26.0–34.0)
MCHC: 33.2 g/dL (ref 30.0–36.0)
MCV: 87 fL (ref 80.0–100.0)
Monocytes Absolute: 0.3 10*3/uL (ref 0.1–1.0)
Monocytes Relative: 8 %
Neutro Abs: 1.7 10*3/uL (ref 1.7–7.7)
Neutrophils Relative %: 48 %
Platelet Count: 168 10*3/uL (ref 150–400)
RBC: 3.84 MIL/uL — ABNORMAL LOW (ref 3.87–5.11)
RDW: 12.9 % (ref 11.5–15.5)
WBC Count: 3.5 10*3/uL — ABNORMAL LOW (ref 4.0–10.5)
nRBC: 0 % (ref 0.0–0.2)

## 2021-04-04 LAB — CMP (CANCER CENTER ONLY)
ALT: 10 U/L (ref 0–44)
AST: 16 U/L (ref 15–41)
Albumin: 3.9 g/dL (ref 3.5–5.0)
Alkaline Phosphatase: 69 U/L (ref 38–126)
Anion gap: 9 (ref 5–15)
BUN: 10 mg/dL (ref 6–20)
CO2: 24 mmol/L (ref 22–32)
Calcium: 9 mg/dL (ref 8.9–10.3)
Chloride: 109 mmol/L (ref 98–111)
Creatinine: 0.64 mg/dL (ref 0.44–1.00)
GFR, Estimated: >60 mL/min (ref >60)
Glucose, Bld: 89 mg/dL (ref 70–99)
Potassium: 3.7 mmol/L (ref 3.5–5.1)
Sodium: 142 mmol/L (ref 135–145)
Total Bilirubin: 0.5 mg/dL (ref 0.3–1.2)
Total Protein: 6.7 g/dL (ref 6.5–8.1)

## 2021-04-04 MED ORDER — DIPHENHYDRAMINE HCL 25 MG PO CAPS
25.0000 mg | ORAL_CAPSULE | Freq: Once | ORAL | Status: AC
Start: 2021-04-04 — End: 2021-04-04
  Administered 2021-04-04: 25 mg via ORAL

## 2021-04-04 MED ORDER — HEPARIN SOD (PORK) LOCK FLUSH 100 UNIT/ML IV SOLN
500.0000 [IU] | Freq: Once | INTRAVENOUS | Status: AC | PRN
  Administered 2021-04-04: 500 [IU]
  Filled 2021-04-04: qty 5

## 2021-04-04 MED ORDER — ACETAMINOPHEN 325 MG PO TABS
650.0000 mg | ORAL_TABLET | Freq: Once | ORAL | Status: AC
  Administered 2021-04-04: 650 mg via ORAL

## 2021-04-04 MED ORDER — TRASTUZUMAB-DKST CHEMO 150 MG IV SOLR
6.0000 mg/kg | Freq: Once | INTRAVENOUS | Status: AC
  Administered 2021-04-04: 336 mg via INTRAVENOUS
  Filled 2021-04-04: qty 16

## 2021-04-04 MED ORDER — PERTUZUMAB CHEMO INJECTION 420 MG/14ML
420.0000 mg | Freq: Once | INTRAVENOUS | Status: AC
  Administered 2021-04-04: 420 mg via INTRAVENOUS
  Filled 2021-04-04: qty 14

## 2021-04-04 MED ORDER — SODIUM CHLORIDE 0.9% FLUSH
10.0000 mL | INTRAVENOUS | Status: DC | PRN
  Administered 2021-04-04: 10 mL
  Filled 2021-04-04: qty 10

## 2021-04-04 MED ORDER — SODIUM CHLORIDE 0.9 % IV SOLN
Freq: Once | INTRAVENOUS | Status: AC
Start: 2021-04-04 — End: 2021-04-04
  Filled 2021-04-04: qty 250

## 2021-04-04 MED ORDER — DIPHENHYDRAMINE HCL 25 MG PO CAPS
ORAL_CAPSULE | ORAL | Status: AC
  Filled 2021-04-04: qty 1

## 2021-04-04 MED ORDER — ACETAMINOPHEN 325 MG PO TABS
ORAL_TABLET | ORAL | Status: AC
  Filled 2021-04-04: qty 2

## 2021-04-04 NOTE — Patient Instructions (Signed)
[facility] Discharge Instructions for Patients Receiving Chemotherapy Today you received the following chemotherapy agents: Ogivri, Perjeta   To help prevent nausea and vomiting after your treatment, we encourage you to take your nausea medication as directed.    If you develop nausea and vomiting that is not controlled by your nausea medication, call the clinic.   BELOW ARE SYMPTOMS THAT SHOULD BE REPORTED IMMEDIATELY:  *FEVER GREATER THAN 100.5 F  *CHILLS WITH OR WITHOUT FEVER  NAUSEA AND VOMITING THAT IS NOT CONTROLLED WITH YOUR NAUSEA MEDICATION  *UNUSUAL SHORTNESS OF BREATH  *UNUSUAL BRUISING OR BLEEDING  TENDERNESS IN MOUTH AND THROAT WITH OR WITHOUT PRESENCE OF ULCERS  *URINARY PROBLEMS  *BOWEL PROBLEMS  UNUSUAL RASH Items with * indicate a potential emergency and should be followed up as soon as possible.  Feel free to call the clinic should you have any questions or concerns. The clinic phone number is [phone].  Please show the [facility] at check-in to the Emergency Department and triage nurse.

## 2021-04-05 ENCOUNTER — Encounter: Payer: Self-pay | Admitting: Licensed Clinical Social Worker

## 2021-04-05 ENCOUNTER — Ambulatory Visit
Admission: RE | Admit: 2021-04-05 | Discharge: 2021-04-05 | Disposition: A | Discharge status: Home / Self Care | Payer: Medicaid Other | Source: Ambulatory Visit | Attending: Radiation Oncology | Admitting: Radiation Oncology

## 2021-04-05 ENCOUNTER — Ambulatory Visit: Payer: Medicaid Other

## 2021-04-05 DIAGNOSIS — Z170 Estrogen receptor positive status [ER+]: Secondary | ICD-10-CM | POA: Diagnosis not present

## 2021-04-05 DIAGNOSIS — Z510 Encounter for antineoplastic radiation therapy: Secondary | ICD-10-CM | POA: Diagnosis not present

## 2021-04-05 DIAGNOSIS — C50211 Malignant neoplasm of upper-inner quadrant of right female breast: Secondary | ICD-10-CM | POA: Diagnosis not present

## 2021-04-05 DIAGNOSIS — Z5112 Encounter for antineoplastic immunotherapy: Secondary | ICD-10-CM | POA: Diagnosis not present

## 2021-04-05 NOTE — Progress Notes (Signed)
Amanda Rich  CSW received notification that patient approved for assistance through the [employer]. Patient notified by [facility] as well.  Christeen Douglas, LCSW

## 2021-04-06 ENCOUNTER — Telehealth: Payer: Self-pay | Admitting: Hematology and Oncology

## 2021-04-06 ENCOUNTER — Ambulatory Visit: Payer: Medicaid Other

## 2021-04-06 ENCOUNTER — Encounter: Payer: Self-pay | Admitting: *Deleted

## 2021-04-06 NOTE — Telephone Encounter (Signed)
Scheduled per 4/11 los. Pt will receive an updated appt calendar per next visit appt notes  

## 2021-04-07 ENCOUNTER — Ambulatory Visit: Payer: Medicaid Other

## 2021-04-07 ENCOUNTER — Other Ambulatory Visit: Payer: Self-pay

## 2021-04-07 ENCOUNTER — Ambulatory Visit
Admission: RE | Admit: 2021-04-07 | Discharge: 2021-04-07 | Disposition: A | Discharge status: Home / Self Care | Payer: Medicaid Other | Source: Ambulatory Visit | Attending: Radiation Oncology | Admitting: Radiation Oncology

## 2021-04-07 DIAGNOSIS — Z510 Encounter for antineoplastic radiation therapy: Secondary | ICD-10-CM | POA: Diagnosis not present

## 2021-04-07 DIAGNOSIS — C50211 Malignant neoplasm of upper-inner quadrant of right female breast: Secondary | ICD-10-CM

## 2021-04-07 DIAGNOSIS — Z170 Estrogen receptor positive status [ER+]: Secondary | ICD-10-CM

## 2021-04-07 DIAGNOSIS — Z5112 Encounter for antineoplastic immunotherapy: Secondary | ICD-10-CM | POA: Diagnosis not present

## 2021-04-07 MED ORDER — RADIAPLEXRX EX GEL: Freq: Once | CUTANEOUS | Status: AC

## 2021-04-08 ENCOUNTER — Ambulatory Visit: Payer: Medicaid Other

## 2021-04-08 ENCOUNTER — Ambulatory Visit
Admission: RE | Admit: 2021-04-08 | Discharge: 2021-04-08 | Disposition: A | Discharge status: Home / Self Care | Payer: Medicaid Other | Source: Ambulatory Visit | Attending: Radiation Oncology | Admitting: Radiation Oncology

## 2021-04-08 DIAGNOSIS — Z5112 Encounter for antineoplastic immunotherapy: Secondary | ICD-10-CM | POA: Diagnosis not present

## 2021-04-08 DIAGNOSIS — C50211 Malignant neoplasm of upper-inner quadrant of right female breast: Secondary | ICD-10-CM | POA: Diagnosis not present

## 2021-04-08 DIAGNOSIS — Z510 Encounter for antineoplastic radiation therapy: Secondary | ICD-10-CM | POA: Diagnosis not present

## 2021-04-08 DIAGNOSIS — Z170 Estrogen receptor positive status [ER+]: Secondary | ICD-10-CM | POA: Diagnosis not present

## 2021-04-11 ENCOUNTER — Ambulatory Visit
Admission: RE | Admit: 2021-04-11 | Discharge: 2021-04-11 | Disposition: A | Discharge status: Home / Self Care | Payer: Medicaid Other | Source: Ambulatory Visit | Attending: Radiation Oncology | Admitting: Radiation Oncology

## 2021-04-11 ENCOUNTER — Encounter: Payer: Self-pay | Admitting: Radiation Oncology

## 2021-04-11 ENCOUNTER — Other Ambulatory Visit: Payer: Self-pay

## 2021-04-11 DIAGNOSIS — Z510 Encounter for antineoplastic radiation therapy: Secondary | ICD-10-CM | POA: Diagnosis not present

## 2021-04-11 DIAGNOSIS — C50211 Malignant neoplasm of upper-inner quadrant of right female breast: Secondary | ICD-10-CM | POA: Diagnosis not present

## 2021-04-11 DIAGNOSIS — Z170 Estrogen receptor positive status [ER+]: Secondary | ICD-10-CM | POA: Diagnosis not present

## 2021-04-11 DIAGNOSIS — Z5112 Encounter for antineoplastic immunotherapy: Secondary | ICD-10-CM | POA: Diagnosis not present

## 2021-04-24 DIAGNOSIS — Z419 Encounter for procedure for purposes other than remedying health state, unspecified: Secondary | ICD-10-CM | POA: Diagnosis not present

## 2021-04-25 ENCOUNTER — Inpatient Hospital Stay: Payer: Medicaid Other | Attending: Hematology and Oncology

## 2021-04-25 ENCOUNTER — Other Ambulatory Visit: Payer: Self-pay

## 2021-04-25 VITALS — BP 107/73 | HR 74 | Temp 98.3°F | Resp 16

## 2021-04-25 DIAGNOSIS — G620 Drug-induced polyneuropathy: Secondary | ICD-10-CM | POA: Insufficient documentation

## 2021-04-25 DIAGNOSIS — C50211 Malignant neoplasm of upper-inner quadrant of right female breast: Secondary | ICD-10-CM | POA: Insufficient documentation

## 2021-04-25 DIAGNOSIS — Z5112 Encounter for antineoplastic immunotherapy: Secondary | ICD-10-CM | POA: Insufficient documentation

## 2021-04-25 DIAGNOSIS — Z170 Estrogen receptor positive status [ER+]: Secondary | ICD-10-CM | POA: Diagnosis not present

## 2021-04-25 DIAGNOSIS — Z79899 Other long term (current) drug therapy: Secondary | ICD-10-CM | POA: Insufficient documentation

## 2021-04-25 DIAGNOSIS — E876 Hypokalemia: Secondary | ICD-10-CM | POA: Insufficient documentation

## 2021-04-25 MED ORDER — DIPHENHYDRAMINE HCL 25 MG PO CAPS
ORAL_CAPSULE | ORAL | Status: AC
  Filled 2021-04-25: qty 1

## 2021-04-25 MED ORDER — ACETAMINOPHEN 325 MG PO TABS
650.0000 mg | ORAL_TABLET | Freq: Once | ORAL | Status: AC
  Administered 2021-04-25: 650 mg via ORAL

## 2021-04-25 MED ORDER — SODIUM CHLORIDE 0.9% FLUSH
10.0000 mL | INTRAVENOUS | Status: DC | PRN
  Administered 2021-04-25: 10 mL
  Filled 2021-04-25: qty 10

## 2021-04-25 MED ORDER — SODIUM CHLORIDE 0.9 % IV SOLN
420.0000 mg | Freq: Once | INTRAVENOUS | Status: AC
  Administered 2021-04-25: 420 mg via INTRAVENOUS
  Filled 2021-04-25: qty 14

## 2021-04-25 MED ORDER — TRASTUZUMAB-DKST CHEMO 150 MG IV SOLR
6.0000 mg/kg | Freq: Once | INTRAVENOUS | Status: AC
  Administered 2021-04-25: 336 mg via INTRAVENOUS
  Filled 2021-04-25: qty 16

## 2021-04-25 MED ORDER — DIPHENHYDRAMINE HCL 25 MG PO CAPS
25.0000 mg | ORAL_CAPSULE | Freq: Once | ORAL | Status: AC
  Administered 2021-04-25: 25 mg via ORAL

## 2021-04-25 MED ORDER — HEPARIN SOD (PORK) LOCK FLUSH 100 UNIT/ML IV SOLN
500.0000 [IU] | Freq: Once | INTRAVENOUS | Status: AC | PRN
  Administered 2021-04-25: 500 [IU]
  Filled 2021-04-25: qty 5

## 2021-04-25 MED ORDER — SODIUM CHLORIDE 0.9 % IV SOLN
Freq: Once | INTRAVENOUS | Status: AC
  Filled 2021-04-25: qty 250

## 2021-04-25 MED ORDER — ACETAMINOPHEN 325 MG PO TABS
ORAL_TABLET | ORAL | Status: AC
  Filled 2021-04-25: qty 2

## 2021-04-25 NOTE — Patient Instructions (Signed)
[facility] ONCOLOGY  Discharge Instructions: Thank you for choosing [facility] to provide your oncology and hematology care.   If you have a lab appointment with the [facility], please go directly to the [facility] and check in at the registration area.   Wear comfortable clothing and clothing appropriate for easy access to any Portacath or PICC line.   We strive to give you quality time with your provider. You may need to reschedule your appointment if you arrive late (15 or more minutes).  Arriving late affects you and other patients whose appointments are after yours.  Also, if you miss three or more appointments without notifying the office, you may be dismissed from the clinic at the provider's discretion.      For prescription refill requests, have your pharmacy contact our office and allow 72 hours for refills to be completed.    Today you received the following chemotherapy and/or immunotherapy agents:Trastuzumanb, Pertuzumab.     To help prevent nausea and vomiting after your treatment, we encourage you to take your nausea medication as directed.  BELOW ARE SYMPTOMS THAT SHOULD BE REPORTED IMMEDIATELY: . *FEVER GREATER THAN 100.4 F (38 C) OR HIGHER . *CHILLS OR SWEATING . *NAUSEA AND VOMITING THAT IS NOT CONTROLLED WITH YOUR NAUSEA MEDICATION . *UNUSUAL SHORTNESS OF BREATH . *UNUSUAL BRUISING OR BLEEDING . *URINARY PROBLEMS (pain or burning when urinating, or frequent urination) . *BOWEL PROBLEMS (unusual diarrhea, constipation, pain near the anus) . TENDERNESS IN MOUTH AND THROAT WITH OR WITHOUT PRESENCE OF ULCERS (sore throat, sores in mouth, or a toothache) . UNUSUAL RASH, SWELLING OR PAIN  . UNUSUAL VAGINAL DISCHARGE OR ITCHING   Items with * indicate a potential emergency and should be followed up as soon as possible or go to the Emergency Department if any problems should occur.  Please show the CHEMOTHERAPY ALERT CARD or  IMMUNOTHERAPY ALERT CARD at check-in to the Emergency Department and triage nurse.  Should you have questions after your visit or need to cancel or reschedule your appointment, please contact [facility]  Dept: [phone]  and follow the prompts.  Office hours are 8:00 a.m. to 4:30 p.m. Monday - Friday. Please note that voicemails left after 4:00 p.m. may not be returned until the following business day.  We are closed weekends and major holidays. You have access to a nurse at all times for urgent questions. Please call the main number to the clinic Dept: [phone] and follow the prompts.   For any non-urgent questions, you may also contact your provider using MyChart. We now offer e-Visits for anyone 65 and older to request care online for non-urgent symptoms. For details visit [email or web address].   Also download the MyChart app! Go to the app store, search "MyChart", open the app, select [facility], and log in with your MyChart username and password.  Due to Covid, a mask is required upon entering the hospital/clinic. If you do not have a mask, one will be given to you upon arrival. For doctor visits, patients may have 1 support person aged 73 or older with them. For treatment visits, patients cannot have anyone with them due to current Covid guidelines and our immunocompromised population.

## 2021-05-02 ENCOUNTER — Ambulatory Visit: Payer: Medicaid Other | Attending: Surgery

## 2021-05-02 ENCOUNTER — Encounter: Payer: Self-pay | Admitting: Radiation Oncology

## 2021-05-02 ENCOUNTER — Other Ambulatory Visit: Payer: Self-pay

## 2021-05-02 DIAGNOSIS — C50211 Malignant neoplasm of upper-inner quadrant of right female breast: Secondary | ICD-10-CM | POA: Insufficient documentation

## 2021-05-02 DIAGNOSIS — Z170 Estrogen receptor positive status [ER+]: Secondary | ICD-10-CM | POA: Insufficient documentation

## 2021-05-02 NOTE — Therapy (Signed)
[place], [place], [address] Phone: [phone]   Fax:  [phone]  Physical Therapy Treatment  Patient Details  Name: Amanda Rich MRN: [id number] Date of Birth: [birth year] Referring Provider (PT): Dr. [name]   Encounter Date: 05/02/2021   PT End of Session - 05/02/21 1127    Visit Number 2   unchanged secondary to SOZO screen   Number of Visits 2    Date for PT Re-Evaluation 03/01/21    PT Start Time 1100    PT Stop Time 1110    PT Time Calculation (min) 10 min    Activity Tolerance Patient tolerated treatment well           Past Medical History:  Diagnosis Date  . Anemia   . Cancer [facility])    breast cancer  . Depression   . Family history of breast cancer   . Family history of liver cancer   . History of radiation therapy 03/10/21-04/11/21   Right breast- Dr. Gery Pray  . Pericarditis   . Smoker   . Umbilical hernia     Past Surgical History:  Procedure Laterality Date  . ABLATION ON ENDOMETRIOSIS    . BREAST LUMPECTOMY WITH RADIOACTIVE SEED AND SENTINEL LYMPH NODE BIOPSY Right 02/02/2021   Procedure: RIGHT BREAST LUMPECTOMY WITH RADIOACTIVE SEED AND RIGHT SENTINEL LYMPH NODE MAPPING;  Surgeon: [name], MD;  Location: [facility];  Service: General;  Laterality: Right;  . GANGLION CYST EXCISION  2007   L wrist; [facility], [facility]  . IR IMAGING GUIDED PORT INSERTION  09/10/2020  . TUBAL LIGATION     [facility]  . UMBILICAL HERNIA REPAIR  2005   [facility]    There were no vitals filed for this visit.   Subjective Assessment - 05/02/21 1126    Subjective pt is here for 3 month SOZO screen    Patient is accompained by: Family member    Pertinent History Patient was diagnosed on 08/17/2020 with right grade III invasive ductal carcinoma breast cancer. She underwent chemotherapy from 09/13/2020 - 01/18/2021. Then she had a right lumpectomy and sentinel node biopsy (4  negative nodes) on 02/02/2021.  It is ER positive, PR negative, andHER2 positive with a Ki67 of 30%. She has a history of a heart attack in 12/2018.                  L-DEX FLOWSHEETS - 05/02/21 1100      L-DEX LYMPHEDEMA SCREENING   Measurement Type Unilateral    L-DEX MEASUREMENT EXTREMITY Upper Extremity    POSITION  Standing    DOMINANT SIDE Right    At Risk Side Right    BASELINE SCORE (UNILATERAL) -2.7    L-DEX SCORE (UNILATERAL) -4.9    VALUE CHANGE (UNILAT) -2.2                                  PT Long Term Goals - 03/03/21 5498      PT LONG TERM GOAL #1   Title Patient will demonstrate she has regained full shoulder ROM and function post operatively compared to baselines.    Time 6    Period Months    Status Achieved                 Plan - 05/02/21 1128    Clinical Impression Statement pt was seen for 3  month SOZO screen.  Score today -4.9 keeping pt well within the proper range    Recommended Other Services continue SOZO screens every 3 months    Consulted and Agree with Plan of Care Patient           Patient will benefit from skilled therapeutic intervention in order to improve the following deficits and impairments:     Visit Diagnosis: Malignant neoplasm of 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, estrogen receptor positive ([place])     Problem List Patient Active Problem List   Diagnosis Date Noted  . Family history of breast cancer   . Family history of liver cancer   . Malignant neoplasm of 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, estrogen receptor positive ([place]) 08/25/2020  . Abnormal chest CT 01/30/2019  . History of pericarditis 01/30/2019  . Pericarditis 01/22/2019  . Chest pain 12/26/2018  . History of endometrial ablation 01/21/2018  . Family history of colon cancer 09/24/2017  . Poor diet 01/24/2017  . Weight loss, abnormal 01/24/2017  . Smoker 09/22/2016  . Mixed hyperlipidemia 08/07/2016   . Irregular intermenstrual bleeding 08/07/2016  . Depression 08/07/2016  . Fat necrosis of peritoneum ([place]) 08/07/2016  . Pelvic congestion syndrome 08/07/2016    Claris Pong 05/02/2021, 11:33 AM  [place], [place], [address] Phone: [phone]   Fax:  [phone]  Name: Amanda Rich MRN: [id number] Date of Birth: [birth year] Cheral Almas, PT 05/02/21 11:34 AM

## 2021-05-06 NOTE — Progress Notes (Signed)
Radiation Oncology         [phone] ________________________________  Name: Amanda Rich MRN: [id number]  Date: 05/09/2021  DOB: 06/22/1966  Follow-Up Visit Note  CC: Girtha Rm, NP-C  Girtha Rm, NP-C    ICD-10-CM   1. Malignant neoplasm of 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, estrogen receptor positive ([place])  C50.211    Z17.0     Diagnosis:   StageypT0, ypN0, pM0,RightBreastUIQ,Invasive DuctalCarcinoma, ER+/ PR-/ Her2+, Grade3  Interval Since Last Radiation:  1 months  03/10/21/ - 04/11/21    Narrative:  The patient returns today for routine follow-up. She continues on therapy under Dr. Lindi Adie.  She reports the right breast continues to be sensitive but much better compared to when she finished the radiation treatment.  sHe denies any nipple discharge or bleeding.  She denies any problems with swelling in her right arm or hand.  She does complain of significant fatigue which started when she started the Neurontin and I recommended she discuss this with Dr. Lindi Adie.                         Allergies:  is allergic to clarithromycin.  Meds: Current Outpatient Medications  Medication Sig Dispense Refill  . gabapentin (NEURONTIN) 300 MG capsule TAKE 1 CAPSULE BY MOUTH AT BEDTIME 90 capsule 3  . ibuprofen (ADVIL) 800 MG tablet TAKE 1 TABLET BY MOUTH EVERY 8 HOURS AS NEEDED 30 tablet 0  . Melatonin 1 MG CHEW Chew by mouth.    . Multiple Vitamin (MULTIVITAMIN WITH MINERALS) TABS Take 1 tablet by mouth daily.    . citalopram (CELEXA) 10 MG tablet Take 1 tablet (10 mg total) by mouth daily. (Patient not taking: No sig reported) 90 tablet 3  . loratadine (CLARITIN) 10 MG tablet Take 1 tablet (10 mg total) by mouth daily. (Patient not taking: No sig reported)    . nicotine (NICODERM CQ - DOSED IN MG/24 HR) 7 mg/24hr patch PLACE 1 PATCH ONTO THE SKIN DAILY. (Patient not taking: Reported on 05/09/2021) 28 patch 0   No current facility-administered  medications for this encounter.    Physical Findings: The patient is in no acute distress. Patient is alert and oriented.  height is 5' 5"  (1.651 m) and weight is 121 lb (54.9 kg). Her temporal temperature is 96.8 F (36 C) (abnormal). Her blood pressure is 122/83 and her pulse is 76. Her respiration is 18 and oxygen saturation is 100%. . Lungs are clear to auscultation bilaterally. Heart has regular rate and rhythm. No palpable cervical, supraclavicular, or axillary adenopathy. Abdomen soft, non-tender, normal bowel sounds.  Right Breast: no palpable mass, nipple discharge or bleeding.  Skin has healed well.  Mild hyperpigmentation changes.  No dominant mass appreciated breast nipple discharge or bleeding.  Left breast: No palpable mass nipple discharge or bleeding    Lab Findings: Lab Results  Component Value Date   WBC 3.5 (L) 04/04/2021   HGB 11.1 (L) 04/04/2021   HCT 33.4 (L) 04/04/2021   MCV 87.0 04/04/2021   PLT 168 04/04/2021    Radiographic Findings: No results found.  Impression:  StageypT0, ypN0, pM0,RightBreastUIQ,Invasive DuctalCarcinoma, ER+/ PR-/ Her2+, Grade3  The patient is recovering from the effects of radiation.  She has persistent fatigue which may be related to Neurontin.  No palpable or visible signs of recurrence on clinical exam today  Plan: As needed follow-up in radiation oncology.  She will continue close follow-up  in medical oncology as above.    ____________________________________  Blair Promise, PhD, MD   This document serves as a record of services personally performed by Gery Pray, MD. It was created on his behalf by Roney Mans, a trained medical scribe. The creation of this record is based on the scribe's personal observations and the provider's statements to them. This document has been checked and approved by the attending provider.

## 2021-05-06 NOTE — Progress Notes (Incomplete)
  Radiation Oncology         [phone] ________________________________  Name: Amanda Rich MRN: [id number]  Date: 04/11/2021  DOB: 05-Feb-1966  End of Treatment Note  Diagnosis:   StageypT0, ypN0, pM0,RightBreastUIQ,Invasive DuctalCarcinoma, ER+/ PR-/ Her2+, Grade3     Indication for treatment:  Curative       Radiation treatment dates:   03/10/21/ - 04/11/21    Narrative: The patient tolerated radiation treatment relatively well.   She experienced some mild fatigue that did not affect her activities, as well as mild swelling and tenderness to the breast. Her skin remained intact throughout.  Plan: The patient has completed radiation treatment. The patient will return to radiation oncology clinic for routine followup in one month. I advised them to call or return sooner if they have any questions or concerns related to their recovery or treatment.  -----------------------------------  Blair Promise, PhD, MD  This document serves as a record of services personally performed by Gery Pray, MD. It was created on his behalf by Wilburn Mylar, a trained medical scribe. The creation of this record is based on the scribe's personal observations and the provider's statements to them. This document has been checked and approved by the attending provider.

## 2021-05-09 ENCOUNTER — Encounter: Payer: Self-pay | Admitting: Radiation Oncology

## 2021-05-09 ENCOUNTER — Inpatient Hospital Stay (HOSPITAL_BASED_OUTPATIENT_CLINIC_OR_DEPARTMENT_OTHER): Payer: Medicaid Other | Admitting: Hematology and Oncology

## 2021-05-09 ENCOUNTER — Ambulatory Visit
Admission: RE | Admit: 2021-05-09 | Discharge: 2021-05-09 | Disposition: A | Discharge status: Home / Self Care | Payer: Medicaid Other | Source: Ambulatory Visit | Attending: Radiation Oncology | Admitting: Radiation Oncology

## 2021-05-09 ENCOUNTER — Other Ambulatory Visit: Payer: Self-pay

## 2021-05-09 DIAGNOSIS — C50211 Malignant neoplasm of upper-inner quadrant of right female breast: Secondary | ICD-10-CM

## 2021-05-09 DIAGNOSIS — G620 Drug-induced polyneuropathy: Secondary | ICD-10-CM | POA: Diagnosis not present

## 2021-05-09 DIAGNOSIS — Z5112 Encounter for antineoplastic immunotherapy: Secondary | ICD-10-CM | POA: Diagnosis not present

## 2021-05-09 DIAGNOSIS — Z79899 Other long term (current) drug therapy: Secondary | ICD-10-CM | POA: Diagnosis not present

## 2021-05-09 DIAGNOSIS — E876 Hypokalemia: Secondary | ICD-10-CM | POA: Diagnosis not present

## 2021-05-09 DIAGNOSIS — Z170 Estrogen receptor positive status [ER+]: Secondary | ICD-10-CM | POA: Diagnosis not present

## 2021-05-09 DIAGNOSIS — R5383 Other fatigue: Secondary | ICD-10-CM | POA: Diagnosis not present

## 2021-05-09 HISTORY — DX: Personal history of irradiation: Z92.3

## 2021-05-09 NOTE — Progress Notes (Signed)
Patient Care Team: Girtha Rm, NP-C as PCP - General (Family Medicine) Sueanne Margarita, MD as PCP - Cardiology (Cardiology) Rockwell Germany, RN as Oncology Nurse Navigator [name], RN as Oncology Nurse Navigator [name], MD as Consulting Physician (General Surgery) Nicholas Lose, MD as Consulting Physician (Hematology and Oncology) Gery Pray, MD as Consulting Physician (Radiation Oncology)  DIAGNOSIS:  Encounter Diagnosis  Name Primary?  . Malignant neoplasm of 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, estrogen receptor positive ([place])     SUMMARY OF ONCOLOGIC HISTORY: Oncology History  Malignant neoplasm of 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, estrogen receptor positive ([place])  08/25/2020 Initial Diagnosis   Patient palpated a right breast lump x42yrand a left breast lump x285yrDiagnostic mammogram and US[place]oreahowed in the right breast, a 2.7cm mass 5cm from the nipple and 0.8cm mass 1cm from the nipple at the 12:30 position, with borderline cortical thickening in the right axilla, and in the left breast, a 4.0cm mass at the 11 o'clock position representing a hamartoma. 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   09/13/2020 - 01/18/2021 Chemotherapy   [facility]       02/02/2021 Surgery   Right lumpectomy ([facility]): no residual carcinoma, 4 right axillary lymph nodes negative for carcinoma.   02/21/2021 -  Chemotherapy      Patient is on Antibody Plan: BREAST TRASTUZUMAB + PERTUZUMAB Q21D    03/11/2021 - 04/11/2021 Radiation Therapy   Adjuvant radiation     CHIEF COMPLIANT: Complaining of multiple symptoms related to prior chemotherapy  INTERVAL HISTORY: Amanda PANTHERs a [age]ear old above-mentioned history of right breast cancer treated with neoadjuvant  chemotherapy followed lumpectomy and radiation.  She is in extreme amount of stress related to side effects from prior treatment.  She has profound neuropathy because of which she is not able to stand on her feet for long periods of time.  Her legs hurt.  She has ruptured Baker's cyst bilaterally.  This causes some swelling behind the legs and discomfort.  She also feels extremely fatigued to the point that she cannot function on a day-to-day basis.  She also has irritable bowel syndrome because of which she is not able to eat a proper diet.  There is significant skin irritation drowsiness and weakness that makes it impossible for her to work.   ALLERGIES:  is allergic to clarithromycin.  MEDICATIONS:  Current Outpatient Medications  Medication Sig Dispense Refill  . citalopram (CELEXA) 10 MG tablet Take 1 tablet (10 mg total) by mouth daily. (Patient not taking: No sig reported) 90 tablet 3  . gabapentin (NEURONTIN) 300 MG capsule TAKE 1 CAPSULE BY MOUTH AT BEDTIME 90 capsule 3  . ibuprofen (ADVIL) 800 MG tablet TAKE 1 TABLET BY MOUTH EVERY 8 HOURS AS NEEDED 30 tablet 0  . loratadine (CLARITIN) 10 MG tablet Take 1 tablet (10 mg total) by mouth daily. (Patient not taking: No sig reported)    . Melatonin 1 MG CHEW Chew by mouth.    . Multiple Vitamin (MULTIVITAMIN WITH MINERALS) TABS Take 1 tablet by mouth daily.    . nicotine (NICODERM CQ - DOSED IN MG/24 HR) 7 mg/24hr patch PLACE 1 PATCH ONTO THE SKIN DAILY. (Patient not taking: Reported on 05/09/2021) 28 patch 0   No  current facility-administered medications for this visit.    PHYSICAL EXAMINATION: ECOG PERFORMANCE STATUS: 1 - Symptomatic but completely ambulatory  Vitals:   05/09/21 1502  BP: 113/70  Pulse: 83  Resp: 18  Temp: 98.1 F (36.7 C)  SpO2: 99%   Filed Weights   05/09/21 1502  Weight: 121 lb 14.4 oz (55.3 kg)       LABORATORY DATA:  I have reviewed the data as listed CMP Latest Ref Rng & Units 04/04/2021 02/21/2021  01/31/2021  Glucose 70 - 99 mg/dL 89 109(H) 92  BUN 6 - 20 mg/dL _0 Creatinine 0.44 - 1.00 mg/dL 0.64 0.69 0.63  Sodium 135 - 145 mmol/L 142 140 140  Potassium 3.5 - 5.1 mmol/L 3.7 3.3(L) 3.6  Chloride 98 - 111 mmol/L 109 108 108  CO2 22 - 32 mmol/L _1 Calcium 8.9 - 10.3 mg/dL 9.0 9.2 8.9  Total Protein 6.5 - 8.1 g/dL 6.7 6.9 6.5  Total Bilirubin 0.3 - 1.2 mg/dL 0.5 0.5 0.3  Alkaline Phos 38 - 126 U/L 69 79 75  AST 15 - 41 U/L _2 ALT 0 - 44 U/L _3 Lab Results  Component Value Date   WBC 3.5 (L) 04/04/2021   HGB 11.1 (L) 04/04/2021   HCT 33.4 (L) 04/04/2021   MCV 87.0 04/04/2021   PLT 168 04/04/2021   NEUTROABS 1.7 04/04/2021    ASSESSMENT & PLAN:  Malignant neoplasm of 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, estrogen receptor positive ([place]) Patient palpated a right breast lump x59yrand a left breast lump x226yrDiagnostic mammogram and US[place]ahowed in the right breast, a 2.7cm mass 5cm from the nipple and 0.8cm mass 1cm from the nipple at the 12:30 position, with borderline cortical thickening in the right axilla, and in the left breast, a 4.0cm mass at the 11 o'clock position representing a hamartoma. 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  Treatment plan: 1. Neoadjuvant chemotherapy with TCRalstonerjeta 6 cyclescompleted 1/25/2022followed by Herceptin Perjetamaintenance for 1 year 2. Followed by breast conserving surgery if possible with sentinel lymph node study2/08/2021: Complete pathologic response, 0/4lymph nodes negative 3. Followed by adjuvant radiation therapy 03/14/21- 04/08/21 4.Followed by adjuvant antiestrogen therapy ------------------------------------------------------------------------------------------------------------------------------------------- Current treatment:Maintenance  Herceptin and Perjeta until 08/29/2021 Echocardiogram 09/06/2020: EF 60 to 65%  Toxicities:  Mild intermittent diarrhea: Mild hypokalemia: on potassium containing foods.  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  This makes her very sleepy and tired.  She is only able to tolerate 3 mg at bedtime.  Pain that goes down the legs making difficulty for her to stand or walk.  Functional capacity evaluation: Patient cannot lift over 10 pounds.  She cannot sit or stand for 8 hours a day on the job. Therefore I believe she should be provided with disability.  She is appealing the decision.  Monitoring closely for toxicities. Return to clinicevery 3 weeks for Herceptin and Perjeta.     No orders of the defined types were placed in this encounter.  The patient has a good understanding of the overall plan. she agrees with it. she will call with any problems that may develop before the next visit here. Total time spent: 30 mins including face to  face time and time spent for planning, charting and co-ordination of care   Amanda Ohara, MD 05/09/21

## 2021-05-09 NOTE — Assessment & Plan Note (Signed)
Patient palpated a right breast lump x7yrand a left breast lump x265yrDiagnostic mammogram and US[place]ahowed in the right breast, a 2.7cm mass 5cm from the nipple and 0.8cm mass 1cm from the nipple at the 12:30 position, with borderline cortical thickening in the right axilla, and in the left breast, a 4.0cm mass at the 11 o'clock position representing a hamartoma. 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  Treatment plan: 1. Neoadjuvant chemotherapy with TCP[facility]jeta 6 cyclescompleted 1/25/2022followed by Herceptin Perjetamaintenance for 1 year 2. Followed by breast conserving surgery if possible with sentinel lymph node study2/08/2021: Complete pathologic response, 0/4lymph nodes negative 3. Followed by adjuvant radiation therapy 03/14/21- 04/08/21 4.Followed by adjuvant antiestrogen therapy ------------------------------------------------------------------------------------------------------------------------------------------- Current treatment:Maintenance Herceptin and Perjeta until 08/29/2021 Echocardiogram 09/06/2020: EF 60 to 65%  Toxicities:  Mild intermittent diarrhea: Mild hypokalemia: on potassium containing foods.  Radiation dermatitis: Resolved Neuropathy: Mild  Monitoring closely for toxicities. Return to clinicevery 3 weeks for Herceptin and Perjeta.

## 2021-05-09 NOTE — Progress Notes (Signed)
Amanda Rich is here today for follow up post radiation to the breast.   Breast Side: Right    They completed their radiation on: 04/11/21  Does the patient complain of any of the following: . Post radiation skin issues: Patient denies any skin issues to right breast.  . Breast Tenderness: yes . Breast Swelling: no . Lymphadema: no . Range of Motion limitations: Full range of motion . Fatigue post radiation: Patient reports increased fatigue, reporting that some days she is unable to get out of bed. Marland Kitchen Appetite good/fair/poor: Good  Additional comments if applicable: Patient reports having sharp shooting pain rating 8/10 that last for short periods of time,.   Vitals:   05/09/21 0954  BP: 122/83  Pulse: 76  Resp: 18  Temp: (!) 96.8 F (36 C)  TempSrc: Temporal  SpO2: 100%  Weight: 121 lb (54.9 kg)  Height: 5\' 5"  (1.651 m)

## 2021-05-12 ENCOUNTER — Other Ambulatory Visit: Payer: Self-pay | Admitting: *Deleted

## 2021-05-12 DIAGNOSIS — Z79899 Other long term (current) drug therapy: Secondary | ICD-10-CM

## 2021-05-12 DIAGNOSIS — Z5181 Encounter for therapeutic drug level monitoring: Secondary | ICD-10-CM

## 2021-05-15 NOTE — Assessment & Plan Note (Signed)
Patient palpated a right breast lump x76yrand a left breast lump x223yrDiagnostic mammogram and US[place]oreahowed in the right breast, a 2.7cm mass 5cm from the nipple and 0.8cm mass 1cm from the nipple at the 12:30 position, with borderline cortical thickening in the right axilla, and in the left breast, a 4.0cm mass at the 11 o'clock position representing a hamartoma. 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  Treatment plan: 1. Neoadjuvant chemotherapy with TCHavanaerjeta 6 cyclescompleted 1/25/2022followed by Herceptin Perjetamaintenance for 1 year 2. Followed by breast conserving surgery if possible with sentinel lymph node study2/08/2021: Complete pathologic response, 0/4lymph nodes negative 3. Followed by adjuvant radiation therapy3/21/22-4/15/22 4.Followed by adjuvant antiestrogen therapy ------------------------------------------------------------------------------------------------------------------------------------------- Current treatment:Maintenance Herceptin and Perjetauntil 08/29/2021 Echocardiogram 09/06/2020: EF 60 to 65%  Toxicities:  Mild intermittent diarrhea: Mild hypokalemia:onpotassium containing foods.  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  This makes her very sleepy and tired.  She is only able to tolerate 3 mg at bedtime.  Pain that goes down the legs making difficulty for her to stand or walk.  Functional capacity evaluation: Patient cannot lift over 10 pounds.  She cannot sit or stand for 8 hours a day on the job. Therefore I believe she should be provided with disability.  She is appealing the decision.  Monitoring closely for toxicities. Return to clinicevery 3 weeks for Herceptin and Perjeta.

## 2021-05-16 ENCOUNTER — Inpatient Hospital Stay (HOSPITAL_BASED_OUTPATIENT_CLINIC_OR_DEPARTMENT_OTHER): Payer: Medicaid Other | Admitting: Hematology and Oncology

## 2021-05-16 ENCOUNTER — Other Ambulatory Visit: Payer: Medicaid Other

## 2021-05-16 ENCOUNTER — Other Ambulatory Visit: Payer: Self-pay

## 2021-05-16 ENCOUNTER — Inpatient Hospital Stay: Payer: Medicaid Other

## 2021-05-16 DIAGNOSIS — Z170 Estrogen receptor positive status [ER+]: Secondary | ICD-10-CM

## 2021-05-16 DIAGNOSIS — C50211 Malignant neoplasm of upper-inner quadrant of right female breast: Secondary | ICD-10-CM | POA: Diagnosis not present

## 2021-05-16 DIAGNOSIS — Z5112 Encounter for antineoplastic immunotherapy: Secondary | ICD-10-CM | POA: Diagnosis not present

## 2021-05-16 DIAGNOSIS — Z79899 Other long term (current) drug therapy: Secondary | ICD-10-CM | POA: Diagnosis not present

## 2021-05-16 DIAGNOSIS — G620 Drug-induced polyneuropathy: Secondary | ICD-10-CM | POA: Diagnosis not present

## 2021-05-16 DIAGNOSIS — E876 Hypokalemia: Secondary | ICD-10-CM | POA: Diagnosis not present

## 2021-05-16 LAB — CMP (CANCER CENTER ONLY)
ALT: 12 U/L (ref 0–44)
AST: 19 U/L (ref 15–41)
Albumin: 3.7 g/dL (ref 3.5–5.0)
Alkaline Phosphatase: 72 U/L (ref 38–126)
Anion gap: 5 (ref 5–15)
BUN: 10 mg/dL (ref 6–20)
CO2: 28 mmol/L (ref 22–32)
Calcium: 9.4 mg/dL (ref 8.9–10.3)
Chloride: 108 mmol/L (ref 98–111)
Creatinine: 0.7 mg/dL (ref 0.44–1.00)
GFR, Estimated: >60 mL/min (ref >60)
Glucose, Bld: 94 mg/dL (ref 70–99)
Potassium: 3.5 mmol/L (ref 3.5–5.1)
Sodium: 141 mmol/L (ref 135–145)
Total Bilirubin: 0.5 mg/dL (ref 0.3–1.2)
Total Protein: 6.8 g/dL (ref 6.5–8.1)

## 2021-05-16 LAB — CBC WITH DIFFERENTIAL (CANCER CENTER ONLY)
Abs Immature Granulocytes: 0.01 10*3/uL (ref 0.00–0.07)
Basophils Absolute: 0 10*3/uL (ref 0.0–0.1)
Basophils Relative: 1 %
Eosinophils Absolute: 0.1 10*3/uL (ref 0.0–0.5)
Eosinophils Relative: 4 %
HCT: 35.4 % — ABNORMAL LOW (ref 36.0–46.0)
Hemoglobin: 11.8 g/dL — ABNORMAL LOW (ref 12.0–15.0)
Immature Granulocytes: 0 %
Lymphocytes Relative: 45 %
Lymphs Abs: 1.6 10*3/uL (ref 0.7–4.0)
MCH: 28.6 pg (ref 26.0–34.0)
MCHC: 33.3 g/dL (ref 30.0–36.0)
MCV: 85.9 fL (ref 80.0–100.0)
Monocytes Absolute: 0.3 10*3/uL (ref 0.1–1.0)
Monocytes Relative: 8 %
Neutro Abs: 1.5 10*3/uL — ABNORMAL LOW (ref 1.7–7.7)
Neutrophils Relative %: 42 %
Platelet Count: 184 10*3/uL (ref 150–400)
RBC: 4.12 MIL/uL (ref 3.87–5.11)
RDW: 12.5 % (ref 11.5–15.5)
WBC Count: 3.6 10*3/uL — ABNORMAL LOW (ref 4.0–10.5)
nRBC: 0 % (ref 0.0–0.2)

## 2021-05-16 MED ORDER — SODIUM CHLORIDE 0.9 % IV SOLN
Freq: Once | INTRAVENOUS | Status: AC
Start: 2021-05-16 — End: 2021-05-16
  Filled 2021-05-16: qty 250

## 2021-05-16 MED ORDER — ACETAMINOPHEN 325 MG PO TABS
ORAL_TABLET | ORAL | Status: AC
  Filled 2021-05-16: qty 2

## 2021-05-16 MED ORDER — HEPARIN SOD (PORK) LOCK FLUSH 100 UNIT/ML IV SOLN
500.0000 [IU] | Freq: Once | INTRAVENOUS | Status: AC | PRN
  Administered 2021-05-16: 500 [IU]
  Filled 2021-05-16: qty 5

## 2021-05-16 MED ORDER — ACETAMINOPHEN 325 MG PO TABS
650.0000 mg | ORAL_TABLET | Freq: Once | ORAL | Status: AC
  Administered 2021-05-16: 650 mg via ORAL

## 2021-05-16 MED ORDER — SODIUM CHLORIDE 0.9 % IV SOLN
420.0000 mg | Freq: Once | INTRAVENOUS | Status: AC
  Administered 2021-05-16: 420 mg via INTRAVENOUS
  Filled 2021-05-16: qty 14

## 2021-05-16 MED ORDER — DIPHENHYDRAMINE HCL 25 MG PO CAPS
ORAL_CAPSULE | ORAL | Status: AC
  Filled 2021-05-16: qty 1

## 2021-05-16 MED ORDER — SODIUM CHLORIDE 0.9% FLUSH
10.0000 mL | INTRAVENOUS | Status: DC | PRN
  Administered 2021-05-16: 10 mL
  Filled 2021-05-16: qty 10

## 2021-05-16 MED ORDER — DIPHENHYDRAMINE HCL 25 MG PO CAPS
25.0000 mg | ORAL_CAPSULE | Freq: Once | ORAL | Status: AC
  Administered 2021-05-16: 25 mg via ORAL

## 2021-05-16 MED ORDER — SODIUM CHLORIDE 0.9% FLUSH
10.0000 mL | Freq: Once | INTRAVENOUS | Status: AC
  Administered 2021-05-16: 10 mL
  Filled 2021-05-16: qty 10

## 2021-05-16 MED ORDER — TRASTUZUMAB-DKST CHEMO 150 MG IV SOLR
6.0000 mg/kg | Freq: Once | INTRAVENOUS | Status: AC
  Administered 2021-05-16: 336 mg via INTRAVENOUS
  Filled 2021-05-16: qty 16

## 2021-05-16 NOTE — Progress Notes (Signed)
Patient Care Team: Girtha Rm, NP-C as PCP - General (Family Medicine) Sueanne Margarita, MD as PCP - Cardiology (Cardiology) Rockwell Germany, RN as Oncology Nurse Navigator [name], RN as Oncology Nurse Navigator [name], MD as Consulting Physician (General Surgery) Nicholas Lose, MD as Consulting Physician (Hematology and Oncology) Gery Pray, MD as Consulting Physician (Radiation Oncology)  DIAGNOSIS:    ICD-10-CM   1. Malignant neoplasm of 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, estrogen receptor positive ([place])  C50.211    Z17.0     SUMMARY OF ONCOLOGIC HISTORY: Oncology History  Malignant neoplasm of 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, estrogen receptor positive ([place])  08/25/2020 Initial Diagnosis   Patient palpated a right breast lump x22yrand a left breast lump x263yrDiagnostic mammogram and USKoreahowed in the right breast, a 2.7cm mass 5cm from the nipple and 0.8cm mass 1cm from the nipple at the 12:30 position, with borderline cortical thickening in the right axilla, and in the left breast, a 4.0cm mass at the 11 o'clock position representing a hamartoma. 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   09/13/2020 - 01/18/2021 Chemotherapy   [facility]       02/02/2021 Surgery   Right lumpectomy ([facility]): no residual carcinoma, 4 right axillary lymph nodes negative for carcinoma.   02/21/2021 -  Chemotherapy      Patient is on Antibody Plan: BREAST TRASTUZUMAB + PERTUZUMAB Q21D    03/11/2021 - 04/11/2021 Radiation Therapy   Adjuvant radiation     CHIEF COMPLIANT: Maintenance Herceptin and Perjeta   INTERVAL HISTORY: Amanda FAYSONs a [age].o. with above-mentioned history of right breast cancer treated with neoadjuvant chemotherapy followed lumpectomy and  radiation and is currently on Herceptin and Perjeta maintenance. Amanda Rich presents to the clinic today for follow-up.  Continuing problems with chemotherapy-induced neuropathy for which Amanda Rich is taking gabapentin.  Severe joint pains for which Amanda Rich takes ibuprofen as needed.  Amanda Rich does have occasional loose stools but overall her bowel movements are going on well.  ALLERGIES:  is allergic to clarithromycin.  MEDICATIONS:  Current Outpatient Medications  Medication Sig Dispense Refill  . citalopram (CELEXA) 10 MG tablet Take 1 tablet (10 mg total) by mouth daily. (Patient not taking: No sig reported) 90 tablet 3  . gabapentin (NEURONTIN) 300 MG capsule TAKE 1 CAPSULE BY MOUTH AT BEDTIME 90 capsule 3  . ibuprofen (ADVIL) 800 MG tablet TAKE 1 TABLET BY MOUTH EVERY 8 HOURS AS NEEDED 30 tablet 0  . loratadine (CLARITIN) 10 MG tablet Take 1 tablet (10 mg total) by mouth daily. (Patient not taking: No sig reported)    . Melatonin 1 MG CHEW Chew by mouth.    . Multiple Vitamin (MULTIVITAMIN WITH MINERALS) TABS Take 1 tablet by mouth daily.    . nicotine (NICODERM CQ - DOSED IN MG/24 HR) 7 mg/24hr patch PLACE 1 PATCH ONTO THE SKIN DAILY. (Patient not taking: Reported on 05/09/2021) 28 patch 0   No current facility-administered medications for this visit.    PHYSICAL EXAMINATION: ECOG PERFORMANCE STATUS: 1 - Symptomatic but completely ambulatory  Vitals:   05/16/21 1031  BP: 120/76  Pulse: 71  Resp: 18  Temp: (!) 97.3 F (36.3 C)  SpO2: 100%   Filed Weights   05/16/21 1031  Weight: 120 lb 12.8 oz (54.8 kg)     LABORATORY DATA:  I have reviewed the data as listed CMP Latest Ref Rng & Units 04/04/2021 02/21/2021 01/31/2021  Glucose 70 - 99 mg/dL 89 109(H) 92  BUN 6 - 20 mg/dL _0 Creatinine 0.44 - 1.00 mg/dL 0.64 0.69 0.63  Sodium 135 - 145 mmol/L 142 140 140  Potassium 3.5 - 5.1 mmol/L 3.7 3.3(L) 3.6  Chloride 98 - 111 mmol/L 109 108 108  CO2 22 - 32 mmol/L _1 Calcium 8.9 - 10.3  mg/dL 9.0 9.2 8.9  Total Protein 6.5 - 8.1 g/dL 6.7 6.9 6.5  Total Bilirubin 0.3 - 1.2 mg/dL 0.5 0.5 0.3  Alkaline Phos 38 - 126 U/L 69 79 75  AST 15 - 41 U/L _2 ALT 0 - 44 U/L _3 Lab Results  Component Value Date   WBC 3.6 (L) 05/16/2021   HGB 11.8 (L) 05/16/2021   HCT 35.4 (L) 05/16/2021   MCV 85.9 05/16/2021   PLT 184 05/16/2021   NEUTROABS 1.5 (L) 05/16/2021    ASSESSMENT & PLAN:  Malignant neoplasm of 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, estrogen receptor positive ([place]) Patient palpated a right breast lump x14yrand a left breast lump x233yrDiagnostic mammogram and US[place]oreahowed in the right breast, a 2.7cm mass 5cm from the nipple and 0.8cm mass 1cm from the nipple at the 12:30 position, with borderline cortical thickening in the right axilla, and in the left breast, a 4.0cm mass at the 11 o'clock position representing a hamartoma. 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  Treatment plan: 1. Neoadjuvant chemotherapy with [place] 6 cyclescompleted 1/25/2022followed by Herceptin Perjetamaintenance for 1 year 2. Followed by breast conserving surgery if possible with sentinel lymph node study2/08/2021: Complete pathologic response, 0/4lymph nodes negative 3. Followed by adjuvant radiation therapy3/21/22-4/15/22 4.Followed by adjuvant antiestrogen therapy ------------------------------------------------------------------------------------------------------------------------------------------- Current treatment:Maintenance Herceptin and Perjetauntil 08/29/2021 Echocardiogram 09/06/2020: EF 60 to 65%  Toxicities:  Mild intermittent diarrhea: Mild hypokalemia:onpotassium containing foods.  Radiation dermatitis:   Neuropathy: Severe neuropathy symptoms are related to prior chemotherapy: Currently on  gabapentin.   We will need to initiate therapy with antiestrogen treatments when Amanda Rich comes to see usKoreaext Rich.  Monitoring closely for toxicities. Return to clinicevery 3 weeks for Herceptin and Perjeta.  No orders of the defined types were placed in this encounter.  The patient has a good understanding of the overall plan. Amanda Rich agrees with it. Amanda Rich will call with any problems that may develop before the next visit here.  Total Rich spent: 30 mins including face to face Rich and Rich spent for planning, charting and coordination of care  ViRulon EisenmengerMD, MPH 05/16/2021  I, Molly Dorshimer, am acting as scribe for Dr. ViNicholas Lose I have reviewed the above documentation for accuracy and completeness, and I agree with the above.

## 2021-05-16 NOTE — Patient Instructions (Signed)
[facility] MEDICAL [facility]COLOG[facility]  Discharge Instructions: ?Thank you for choosing [facility] to provide your oncology and hematology care.  ? ?If you have a lab appointment with the Cancer Center, please go directly to the Cancer Center and check in at the registration area. ?  ?Wear comfortable clothing and clothing appropriate for easy access to any Portacath or PICC line.  ? ?We strive to give you quality time with your provider. You may need to reschedule your appointment if you arrive late (15 or more minutes).  Arriving late affects you and other patients whose appointments are after yours.  Also, if you miss three or more appointments without notifying the office, you may be dismissed from the clinic at the provider?s discretion.    ?  ?For prescription refill requests, have your pharmacy contact our office and allow 72 hours for refills to be completed.   ? ?Today you received the following chemotherapy and/or immunotherapy agents: trastuzumab and pertuzumab    ?  ?To help prevent nausea and vomiting after your treatment, we encourage you to take your nausea medication as directed. ? ?BELOW ARE SYMPTOMS THAT SHOULD BE REPORTED IMMEDIATELY: ?*FEVER GREATER THAN 100.4 F (38 ?C) OR HIGHER ?*CHILLS OR SWEATING ?*NAUSEA AND VOMITING THAT IS NOT CONTROLLED WITH YOUR NAUSEA MEDICATION ?*UNUSUAL SHORTNESS OF BREATH ?*UNUSUAL BRUISING OR BLEEDING ?*URINARY PROBLEMS (pain or burning when urinating, or frequent urination) ?*BOWEL PROBLEMS (unusual diarrhea, constipation, pain near the anus) ?TENDERNESS IN MOUTH AND THROAT WITH OR WITHOUT PRESENCE OF ULCERS (sore throat, sores in mouth, or a toothache) ?UNUSUAL RASH, SWELLING OR PAIN  ?UNUSUAL VAGINAL DISCHARGE OR ITCHING  ? ?Items with * indicate a potential emergency and should be followed up as soon as possible or go to the Emergency Department if any problems should occur. ? ?Please show the CHEMOTHERAPY ALERT CARD or IMMUNOTHERAPY ALERT  CARD at check-in to the Emergency Department and triage nurse. ? ?Should you have questions after your visit or need to cancel or reschedule your appointment, please contact [facility]  Dept: [phone]  and follow the prompts.  Office hours are 8:00 a.m. to 4:30 p.m. Monday - Friday. Please note that voicemails left after 4:00 p.m. may not be returned until the following business day.  We are closed weekends and major holidays. You have access to a nurse at all times for urgent questions. Please call the main number to the clinic Dept: [phone] and follow the prompts. ? ? ?For any non-urgent questions, you may also contact your provider using MyChart. We now offer e-Visits for anyone 18 and older to request care online for non-urgent symptoms. For details visit [email or web address]. ?  ?Also download the MyChart app! Go to the app store, search "MyChart", open the app, select [facility], and log in with your MyChart username and password. ? ?Due to Covid, a mask is required upon entering the hospital/clinic. If you do not have a mask, one will be given to you upon arrival. For doctor visits, patients may have 1 support person aged 18 or older with them. For treatment visits, patients cannot have anyone with them due to current Covid guidelines and our immunocompromised population.  ? ? ? ?

## 2021-05-17 ENCOUNTER — Telehealth: Payer: Self-pay | Admitting: Hematology and Oncology

## 2021-05-17 NOTE — Telephone Encounter (Signed)
Scheduled appointments per 05/23 los. Patient will receive updated calender.

## 2021-05-19 ENCOUNTER — Encounter: Payer: Self-pay | Admitting: Family Medicine

## 2021-05-25 DIAGNOSIS — Z419 Encounter for procedure for purposes other than remedying health state, unspecified: Secondary | ICD-10-CM | POA: Diagnosis not present

## 2021-06-01 ENCOUNTER — Telehealth: Payer: Self-pay | Admitting: Hematology and Oncology

## 2021-06-01 NOTE — Telephone Encounter (Signed)
Scheduled appointment per 06/08 sch msg. Patient is aware. 

## 2021-06-02 ENCOUNTER — Ambulatory Visit: Payer: Medicaid Other

## 2021-06-02 ENCOUNTER — Telehealth: Payer: Self-pay | Admitting: Hematology and Oncology

## 2021-06-02 NOTE — Telephone Encounter (Signed)
R/s per 6/9 sch msg, pt aware

## 2021-06-03 ENCOUNTER — Inpatient Hospital Stay: Payer: Medicaid Other | Attending: Hematology and Oncology

## 2021-06-03 ENCOUNTER — Encounter: Payer: Self-pay | Admitting: Hematology and Oncology

## 2021-06-03 DIAGNOSIS — Z79899 Other long term (current) drug therapy: Secondary | ICD-10-CM | POA: Insufficient documentation

## 2021-06-03 DIAGNOSIS — C50211 Malignant neoplasm of upper-inner quadrant of right female breast: Secondary | ICD-10-CM | POA: Insufficient documentation

## 2021-06-03 DIAGNOSIS — Z5112 Encounter for antineoplastic immunotherapy: Secondary | ICD-10-CM | POA: Insufficient documentation

## 2021-06-03 DIAGNOSIS — Z170 Estrogen receptor positive status [ER+]: Secondary | ICD-10-CM | POA: Insufficient documentation

## 2021-06-06 ENCOUNTER — Other Ambulatory Visit: Payer: Self-pay

## 2021-06-06 ENCOUNTER — Inpatient Hospital Stay: Payer: Medicaid Other

## 2021-06-06 VITALS — BP 114/71 | HR 76 | Temp 98.1°F | Resp 18 | Wt 120.5 lb

## 2021-06-06 DIAGNOSIS — C50211 Malignant neoplasm of upper-inner quadrant of right female breast: Secondary | ICD-10-CM | POA: Diagnosis not present

## 2021-06-06 DIAGNOSIS — Z170 Estrogen receptor positive status [ER+]: Secondary | ICD-10-CM | POA: Diagnosis not present

## 2021-06-06 DIAGNOSIS — Z79899 Other long term (current) drug therapy: Secondary | ICD-10-CM | POA: Diagnosis not present

## 2021-06-06 DIAGNOSIS — Z5112 Encounter for antineoplastic immunotherapy: Secondary | ICD-10-CM | POA: Diagnosis not present

## 2021-06-06 MED ORDER — DIPHENHYDRAMINE HCL 25 MG PO CAPS
ORAL_CAPSULE | ORAL | Status: AC
  Filled 2021-06-06: qty 1

## 2021-06-06 MED ORDER — SODIUM CHLORIDE 0.9 % IV SOLN
Freq: Once | INTRAVENOUS | Status: AC
  Filled 2021-06-06: qty 250

## 2021-06-06 MED ORDER — SODIUM CHLORIDE 0.9% FLUSH
10.0000 mL | INTRAVENOUS | Status: DC | PRN
  Administered 2021-06-06: 10 mL
  Filled 2021-06-06: qty 10

## 2021-06-06 MED ORDER — HEPARIN SOD (PORK) LOCK FLUSH 100 UNIT/ML IV SOLN
500.0000 [IU] | Freq: Once | INTRAVENOUS | Status: AC | PRN
  Administered 2021-06-06: 500 [IU]
  Filled 2021-06-06: qty 5

## 2021-06-06 MED ORDER — TRASTUZUMAB-DKST CHEMO 150 MG IV SOLR
6.0000 mg/kg | Freq: Once | INTRAVENOUS | Status: AC
  Administered 2021-06-06: 336 mg via INTRAVENOUS
  Filled 2021-06-06: qty 16

## 2021-06-06 MED ORDER — ACETAMINOPHEN 325 MG PO TABS
650.0000 mg | ORAL_TABLET | Freq: Once | ORAL | Status: AC
Start: 2021-06-06 — End: 2021-06-06
  Administered 2021-06-06: 650 mg via ORAL

## 2021-06-06 MED ORDER — DIPHENHYDRAMINE HCL 25 MG PO CAPS
25.0000 mg | ORAL_CAPSULE | Freq: Once | ORAL | Status: AC
  Administered 2021-06-06: 25 mg via ORAL

## 2021-06-06 MED ORDER — ACETAMINOPHEN 325 MG PO TABS
ORAL_TABLET | ORAL | Status: AC
  Filled 2021-06-06: qty 2

## 2021-06-06 MED ORDER — SODIUM CHLORIDE 0.9 % IV SOLN
420.0000 mg | Freq: Once | INTRAVENOUS | Status: AC
  Administered 2021-06-06: 420 mg via INTRAVENOUS
  Filled 2021-06-06: qty 14

## 2021-06-06 NOTE — Patient Instructions (Signed)
[facility]COL[facility]    Discharge Instructions: Thank you for choosing [facility] to provide your oncology and hematology care.   If you have a lab appointment with the [facility], please go directly to the [facility] and check in at the registration area.   Wear comfortable clothing and clothing appropriate for easy access to any Portacath or PICC line.   We strive to give you quality time with your provider. You may need to reschedule your appointment if you arrive late (15 or more minutes).  Arriving late affects you and other patients whose appointments are after yours.  Also, if you miss three or more appointments without notifying the office, you may be dismissed from the clinic at the provider's discretion.      For prescription refill requests, have your pharmacy contact our office and allow 72 hours for refills to be completed.    Today you received the following chemotherapy and/or immunotherapy agents: trastuzumab and pertuzumab.      To help prevent nausea and vomiting after your treatment, we encourage you to take your nausea medication as directed.  BELOW ARE SYMPTOMS THAT SHOULD BE REPORTED IMMEDIATELY: *FEVER GREATER THAN 100.4 F (38 C) OR HIGHER *CHILLS OR SWEATING *NAUSEA AND VOMITING THAT IS NOT CONTROLLED WITH YOUR NAUSEA MEDICATION *UNUSUAL SHORTNESS OF BREATH *UNUSUAL BRUISING OR BLEEDING *URINARY PROBLEMS (pain or burning when urinating, or frequent urination) *BOWEL PROBLEMS (unusual diarrhea, constipation, pain near the anus) TENDERNESS IN MOUTH AND THROAT WITH OR WITHOUT PRESENCE OF ULCERS (sore throat, sores in mouth, or a toothache) UNUSUAL RASH, SWELLING OR PAIN  UNUSUAL VAGINAL DISCHARGE OR ITCHING   Items with * indicate a potential emergency and should be followed up as soon as possible or go to the Emergency Department if any problems should occur.  Please show the CHEMOTHERAPY ALERT CARD or IMMUNOTHERAPY  ALERT CARD at check-in to the Emergency Department and triage nurse.  Should you have questions after your visit or need to cancel or reschedule your appointment, please contact [facility]  Dept: [phone]  and follow the prompts.  Office hours are 8:00 a.m. to 4:30 p.m. Monday - Friday. Please note that voicemails left after 4:00 p.m. may not be returned until the following business day.  We are closed weekends and major holidays. You have access to a nurse at all times for urgent questions. Please call the main number to the clinic Dept: [phone] and follow the prompts.   For any non-urgent questions, you may also contact your provider using MyChart. We now offer e-Visits for anyone 42 and older to request care online for non-urgent symptoms. For details visit [email or web address].   Also download the MyChart app! Go to the app store, search "MyChart", open the app, select [facility], and log in with your MyChart username and password.  Due to Covid, a mask is required upon entering the hospital/clinic. If you do not have a mask, one will be given to you upon arrival. For doctor visits, patients may have 1 support person aged 21 or older with them. For treatment visits, patients cannot have anyone with them due to current Covid guidelines and our immunocompromised population.

## 2021-06-07 ENCOUNTER — Inpatient Hospital Stay (HOSPITAL_COMMUNITY): Admission: RE | Admit: 2021-06-07 | Payer: Medicaid Other | Source: Ambulatory Visit

## 2021-06-10 ENCOUNTER — Ambulatory Visit (INDEPENDENT_AMBULATORY_CARE_PROVIDER_SITE_OTHER): Payer: Medicaid Other | Admitting: Family Medicine

## 2021-06-10 ENCOUNTER — Encounter: Payer: Self-pay | Admitting: Hematology and Oncology

## 2021-06-10 ENCOUNTER — Encounter: Payer: Self-pay | Admitting: Family Medicine

## 2021-06-10 ENCOUNTER — Other Ambulatory Visit: Payer: Self-pay

## 2021-06-10 ENCOUNTER — Other Ambulatory Visit (HOSPITAL_COMMUNITY): Payer: Self-pay

## 2021-06-10 VITALS — BP 110/62 | HR 62 | Wt 120.2 lb

## 2021-06-10 DIAGNOSIS — Z170 Estrogen receptor positive status [ER+]: Secondary | ICD-10-CM | POA: Diagnosis not present

## 2021-06-10 DIAGNOSIS — C50211 Malignant neoplasm of upper-inner quadrant of right female breast: Secondary | ICD-10-CM

## 2021-06-10 DIAGNOSIS — Z720 Tobacco use: Secondary | ICD-10-CM

## 2021-06-10 DIAGNOSIS — R0602 Shortness of breath: Secondary | ICD-10-CM | POA: Diagnosis not present

## 2021-06-10 DIAGNOSIS — F32A Depression, unspecified: Secondary | ICD-10-CM

## 2021-06-10 DIAGNOSIS — J439 Emphysema, unspecified: Secondary | ICD-10-CM

## 2021-06-10 MED ORDER — ALBUTEROL SULFATE HFA 108 (90 BASE) MCG/ACT IN AERS
2.0000 | INHALATION_SPRAY | Freq: Four times a day (QID) | RESPIRATORY_TRACT | 0 refills | Status: DC | PRN
Start: 2021-06-10 — End: 2022-01-04
  Filled 2021-06-10 – 2021-07-01 (×3): qty 8.5, 25d supply, fill #0

## 2021-06-10 NOTE — Progress Notes (Signed)
   Subjective:    Patient ID: Amanda Rich, female    DOB: June 11, 1966, 55 y.o.   MRN: [id number]  HPI Chief Complaint  Patient presents with   follow-up     Follow-up on everything going on. Recently had breast cancer and doing chemo treatments.    She is here to discuss multiple concerns.   Other providers:  Oncologist- Dr. Lindi Adie  GI- Dr. Collene Mares   Breast cancer. Has been treated with radiation, surgery and chemo. She still has a PICC line.   Depression - started back on Celexa and doing well   Involved in a support group    COPD- emphysema on CT in 2020.  She has been using albuterol more often recently, 1-2 times per week Has not seen pulmonologist   Hx of asthma as child but states she grew out of it.    Staying active but it is becoming more difficult    Depression screen [facility] 2/9 06/10/2021 09/01/2020 03/04/2018 09/24/2017 09/22/2016  Decreased Interest 0 2 0 0 1  Down, Depressed, Hopeless 0 1 1 0 1  PHQ - 2 Score 0 3 1 0 2  Altered sleeping 0 - - - 1  Tired, decreased energy 0 - - - 1  Change in appetite 0 - - - 1  Feeling bad or failure about yourself  0 - - - 0  Trouble concentrating 0 - - - 0  Moving slowly or fidgety/restless 0 - - - 1  Suicidal thoughts 0 - - - 0  PHQ-9 Score 0 - - - 6  Difficult doing work/chores Not difficult at all - - - -  Some recent data might be hidden     Review of Systems Pertinent positives and negatives in the history of present illness.     Objective:   Physical Exam BP 110/62   Pulse 62   Wt 120 lb 3.2 oz (54.5 kg)   LMP 10/15/2017 (Approximate)   BMI 20.00 kg/m   Alert and in no distress.Neck is supple without adenopathy or thyromegaly. Cardiac exam shows a regular sinus rhythm without murmurs or gallops. Lungs are clear to auscultation.  Extremities without edema.  Skin is warm and dry.  Normal speech, mood and memory       Assessment & Plan:  Pulmonary emphysema, unspecified emphysema type ([place]) - Plan:  albuterol (VENTOLIN HFA) 108 (90 Base) MCG/ACT inhaler, Ambulatory referral to Pulmonology -Emphysema on CT in 2020.  Apparently my colleague prescribed her an albuterol inhaler at that time and she has been using it intermittently since.  Recently she has needed it more often and now using it 1-2 times per week.  She has not had PFTs since the diagnosis and has been dealing with breast cancer for the past year and a half or so.  I will refill her albuterol and refer her to pulmonology for further evaluation  Depression, unspecified depression type -Her mood is good today and she will continue with her support groups and continue on Celexa.  Malignant neoplasm of 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, estrogen receptor positive ([place]) -Managed by Dr. Lindi Adie  Currently attempting to quit smoking - Plan: Ambulatory referral to Pulmonology -Encouraged her to completely stop smoking and she is doing a good job with cutting back  Shortness of breath - Plan: Ambulatory referral to Pulmonology -Asymptomatic today.  Increasingly more short of breath with activities and using albuterol.  Referred to pulmonology

## 2021-06-11 ENCOUNTER — Other Ambulatory Visit (HOSPITAL_COMMUNITY): Payer: Self-pay

## 2021-06-15 ENCOUNTER — Ambulatory Visit (INDEPENDENT_AMBULATORY_CARE_PROVIDER_SITE_OTHER): Payer: Medicaid Other | Admitting: Pulmonary Disease

## 2021-06-15 ENCOUNTER — Encounter: Payer: Self-pay | Admitting: Pulmonary Disease

## 2021-06-15 ENCOUNTER — Other Ambulatory Visit: Payer: Self-pay

## 2021-06-15 ENCOUNTER — Other Ambulatory Visit (HOSPITAL_COMMUNITY): Payer: Self-pay

## 2021-06-15 VITALS — BP 116/70 | HR 90 | Temp 98.2°F | Ht 65.0 in | Wt 120.8 lb

## 2021-06-15 DIAGNOSIS — R0609 Other forms of dyspnea: Secondary | ICD-10-CM

## 2021-06-15 DIAGNOSIS — J432 Centrilobular emphysema: Secondary | ICD-10-CM | POA: Diagnosis not present

## 2021-06-15 DIAGNOSIS — R0600 Dyspnea, unspecified: Secondary | ICD-10-CM

## 2021-06-15 MED ORDER — BUDESONIDE-FORMOTEROL FUMARATE 80-4.5 MCG/ACT IN AERO
2.0000 | INHALATION_SPRAY | Freq: Two times a day (BID) | RESPIRATORY_TRACT | 12 refills | Status: DC
  Filled 2021-06-15: qty 10.2, 30d supply, fill #0
  Filled 2021-12-20 – 2022-01-02 (×2): qty 10.2, 30d supply, fill #1
  Filled 2022-03-05: qty 10.2, 30d supply, fill #2

## 2021-06-15 NOTE — Patient Instructions (Signed)
Nice to meet you!  We will get breathing of pulmonary function tests to better evaluate your symptoms.  Use the new inhaler Symbicort 2 puffs twice a day. Rinse out your mouth after every use. Use albuterol 2 puffs AS NEEDED for shortness of breath.   Your sympotms could be related to asthma coming back from childhood or related to emphysema. The breathing tests

## 2021-06-16 ENCOUNTER — Other Ambulatory Visit (HOSPITAL_COMMUNITY): Payer: Self-pay

## 2021-06-21 ENCOUNTER — Encounter: Payer: Self-pay | Admitting: *Deleted

## 2021-06-22 DIAGNOSIS — H5213 Myopia, bilateral: Secondary | ICD-10-CM | POA: Diagnosis not present

## 2021-06-24 ENCOUNTER — Inpatient Hospital Stay (HOSPITAL_COMMUNITY): Admission: RE | Admit: 2021-06-24 | Payer: Medicaid Other | Source: Ambulatory Visit

## 2021-06-24 DIAGNOSIS — Z419 Encounter for procedure for purposes other than remedying health state, unspecified: Secondary | ICD-10-CM | POA: Diagnosis not present

## 2021-06-27 NOTE — Progress Notes (Signed)
Patient Care Team: Girtha Rm, NP-C as PCP - General (Family Medicine) Sueanne Margarita, MD as PCP - Cardiology (Cardiology) Rockwell Germany, RN as Oncology Nurse Navigator [name], RN as Oncology Nurse Navigator [name], MD as Consulting Physician (General Surgery) Nicholas Lose, MD as Consulting Physician (Hematology and Oncology) Gery Pray, MD as Consulting Physician (Radiation Oncology)  DIAGNOSIS:    ICD-10-CM   1. Malignant neoplasm of 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, estrogen receptor positive ([place])  C50.211    Z17.0       SUMMARY OF ONCOLOGIC HISTORY: Oncology History  Malignant neoplasm of 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, estrogen receptor positive ([place])  08/25/2020 Initial Diagnosis   Patient palpated a right breast lump x64yr and a left breast lump x54yr. Diagnostic mammogram and US showed in the right breast, a 2.7cm mass 5cm from the nipple and 0.8cm mass 1cm from the nipple at the 12:30 position, with borderline cortical thickening in the right axilla, and in the left breast, a 4.0cm mass at the 11 o'clock position representing a hamartoma. 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   09/13/2020 - 01/18/2021 Chemotherapy   Neoadjuvant chemotherapy with [facility] Perjeta        02/02/2021 Surgery   Right lumpectomy ([place]): no residual carcinoma, 4 right axillary lymph nodes negative for carcinoma.   02/21/2021 -  Chemotherapy      Patient is on Antibody Plan: BREAST TRASTUZUMAB + PERTUZUMAB Q21D     03/11/2021 - 04/11/2021 Radiation Therapy   Adjuvant radiation     CHIEF COMPLIANT: Adjuvant maintenance Herceptin and Perjeta   INTERVAL HISTORY: Amanda Rich is a 55 y.o. with above-mentioned history of right breast cancer treated with neoadjuvant chemotherapy followed  lumpectomy and radiation and is currently on Herceptin and Perjeta maintenance. She presents to the clinic today for cycle 7.  She is tolerating Herceptin Perjeta extremely well without any problems or concerns.  ALLERGIES:  is allergic to clarithromycin.  MEDICATIONS:  Current Outpatient Medications  Medication Sig Dispense Refill   albuterol (VENTOLIN HFA) 108 (90 Base) MCG/ACT inhaler Inhale 2 puffs into the lungs every 6 (six) hours as needed for wheezing or shortness of breath. 8.5 g 0   budesonide-formoterol (SYMBICORT) 80-4.5 MCG/ACT inhaler Inhale 2 puffs into the lungs in the morning and at bedtime. 10.2 g 12   citalopram (CELEXA) 10 MG tablet Take 1 tablet (10 mg total) by mouth daily. 90 tablet 3   gabapentin (NEURONTIN) 300 MG capsule TAKE 1 CAPSULE BY MOUTH AT BEDTIME 90 capsule 3   ibuprofen (ADVIL) 800 MG tablet TAKE 1 TABLET BY MOUTH EVERY 8 HOURS AS NEEDED 30 tablet 0   Melatonin 1 MG CHEW Chew by mouth.     Multiple Vitamin (MULTIVITAMIN WITH MINERALS) TABS Take 1 tablet by mouth daily.     nicotine (NICODERM CQ - DOSED IN MG/24 HR) 7 mg/24hr patch PLACE 1 PATCH ONTO THE SKIN DAILY. 28 patch 0   No current facility-administered medications for this visit.    PHYSICAL EXAMINATION: ECOG PERFORMANCE STATUS: 1 - Symptomatic but completely ambulatory  Vitals:   06/28/21 1022  BP: 105/77  Pulse: 71  Resp: 18  Temp: 97.7 F (36.5 C)  SpO2: 98%   Filed Weights   06/28/21 1022  Weight: 120 lb  4.8 oz (54.6 kg)    LABORATORY DATA:  I have reviewed the data as listed CMP Latest Ref Rng & Units 06/28/2021 05/16/2021 04/04/2021  Glucose 70 - 99 mg/dL 92 94 89  BUN 6 - 20 mg/dL $Remove'9 10 10  'wusrwve$ Creatinine 0.44 - 1.00 mg/dL 0.72 0.70 0.64  Sodium 135 - 145 mmol/L 142 141 142  Potassium 3.5 - 5.1 mmol/L 3.7 3.5 3.7  Chloride 98 - 111 mmol/L 108 108 109  CO2 22 - 32 mmol/L $RemoveB'27 28 24  'mwrGKZmK$ Calcium 8.9 - 10.3 mg/dL 9.1 9.4 9.0  Total Protein 6.5 - 8.1 g/dL 6.8 6.8 6.7  Total Bilirubin 0.3  - 1.2 mg/dL 0.3 0.5 0.5  Alkaline Phos 38 - 126 U/L 77 72 69  AST 15 - 41 U/L $Remo'20 19 16  'Ephos$ ALT 0 - 44 U/L $Remo'16 12 10    'LWrOS$ Lab Results  Component Value Date   WBC 4.5 06/28/2021   HGB 11.9 (L) 06/28/2021   HCT 35.5 (L) 06/28/2021   MCV 86.8 06/28/2021   PLT 194 06/28/2021   NEUTROABS 1.9 06/28/2021    ASSESSMENT & PLAN:  Malignant neoplasm of 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, estrogen receptor positive ([place]) Patient palpated a right breast lump x34yr and a left breast lump x67yr. Diagnostic mammogram and US showed in the right breast, a 2.7cm mass 5cm from the nipple and 0.8cm mass 1cm from the nipple at the 12:30 position, with borderline cortical thickening in the right axilla, and in the left breast, a 4.0cm mass at the 11 o'clock position representing a hamartoma. 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   Treatment plan: 1. Neoadjuvant chemotherapy with [facility] Perjeta 6 cycles completed 01/18/2021 followed by Herceptin Perjeta maintenance for 1 year 2. Followed by breast conserving surgery if possible with sentinel lymph node study 02/02/2021: Complete pathologic response, 0/4 lymph nodes negative 3. Followed by adjuvant radiation therapy 03/14/21- 04/08/21 4.  Followed by adjuvant antiestrogen therapy with letrozole started 06/28/2021 ------------------------------------------------------------------------------------------------------------------------------------------- Current treatment: Maintenance Herceptin and Perjeta until 08/29/2021 Echocardiogram 09/06/2020: EF 60 to 65%   Toxicities: Mild intermittent diarrhea: Mild hypokalemia: on potassium containing foods.   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    I counseled her about neuropathy clinical  trial and she wants to learn more about it.  Letrozole counseling: We discussed the risks and benefits of anti-estrogen therapy with aromatase inhibitors. These include but not limited to insomnia, hot flashes, mood changes, vaginal dryness, bone density loss, and weight gain. We strongly believe that the benefits far outweigh the risks. Patient understands these risks and consented to starting treatment. Planned treatment duration is 7 years.    Monitoring closely for toxicities. Return to clinic every 3 weeks for Herceptin and Perjeta.  I will see her back in 6 weeks to assess tolerance to letrozole    No orders of the defined types were placed in this encounter.  The patient has a good understanding of the overall plan. she agrees with it. she will call with any problems that may develop before the next visit here.  Total time spent: 30 mins including face to face time and time spent for planning, charting and coordination of care  Rulon Eisenmenger, MD, MPH 06/28/2021  I, Thana Ates, am acting  as scribe for Dr. Nicholas Lose.  I have reviewed the above documentation for accuracy and completeness, and I agree with the above.

## 2021-06-28 ENCOUNTER — Inpatient Hospital Stay: Payer: Medicaid Other | Attending: Hematology and Oncology

## 2021-06-28 ENCOUNTER — Other Ambulatory Visit (HOSPITAL_COMMUNITY): Payer: Self-pay

## 2021-06-28 ENCOUNTER — Inpatient Hospital Stay (HOSPITAL_BASED_OUTPATIENT_CLINIC_OR_DEPARTMENT_OTHER): Payer: Medicaid Other | Admitting: Hematology and Oncology

## 2021-06-28 ENCOUNTER — Inpatient Hospital Stay: Payer: Medicaid Other

## 2021-06-28 ENCOUNTER — Encounter: Payer: Self-pay | Admitting: Emergency Medicine

## 2021-06-28 ENCOUNTER — Other Ambulatory Visit: Payer: Self-pay

## 2021-06-28 ENCOUNTER — Other Ambulatory Visit: Payer: Medicaid Other

## 2021-06-28 DIAGNOSIS — Z79899 Other long term (current) drug therapy: Secondary | ICD-10-CM | POA: Diagnosis not present

## 2021-06-28 DIAGNOSIS — Z23 Encounter for immunization: Secondary | ICD-10-CM

## 2021-06-28 DIAGNOSIS — E876 Hypokalemia: Secondary | ICD-10-CM | POA: Insufficient documentation

## 2021-06-28 DIAGNOSIS — Z170 Estrogen receptor positive status [ER+]: Secondary | ICD-10-CM

## 2021-06-28 DIAGNOSIS — C50211 Malignant neoplasm of upper-inner quadrant of right female breast: Secondary | ICD-10-CM

## 2021-06-28 DIAGNOSIS — Z5112 Encounter for antineoplastic immunotherapy: Secondary | ICD-10-CM | POA: Insufficient documentation

## 2021-06-28 LAB — CBC WITH DIFFERENTIAL (CANCER CENTER ONLY)
Abs Immature Granulocytes: 0.01 10*3/uL (ref 0.00–0.07)
Basophils Absolute: 0 10*3/uL (ref 0.0–0.1)
Basophils Relative: 1 %
Eosinophils Absolute: 0.2 10*3/uL (ref 0.0–0.5)
Eosinophils Relative: 3 %
HCT: 35.5 % — ABNORMAL LOW (ref 36.0–46.0)
Hemoglobin: 11.9 g/dL — ABNORMAL LOW (ref 12.0–15.0)
Immature Granulocytes: 0 %
Lymphocytes Relative: 42 %
Lymphs Abs: 1.9 10*3/uL (ref 0.7–4.0)
MCH: 29.1 pg (ref 26.0–34.0)
MCHC: 33.5 g/dL (ref 30.0–36.0)
MCV: 86.8 fL (ref 80.0–100.0)
Monocytes Absolute: 0.5 10*3/uL (ref 0.1–1.0)
Monocytes Relative: 11 %
Neutro Abs: 1.9 10*3/uL (ref 1.7–7.7)
Neutrophils Relative %: 43 %
Platelet Count: 194 10*3/uL (ref 150–400)
RBC: 4.09 MIL/uL (ref 3.87–5.11)
RDW: 13.7 % (ref 11.5–15.5)
WBC Count: 4.5 10*3/uL (ref 4.0–10.5)
nRBC: 0 % (ref 0.0–0.2)

## 2021-06-28 LAB — CMP (CANCER CENTER ONLY)
ALT: 16 U/L (ref 0–44)
AST: 20 U/L (ref 15–41)
Albumin: 3.6 g/dL (ref 3.5–5.0)
Alkaline Phosphatase: 77 U/L (ref 38–126)
Anion gap: 7 (ref 5–15)
BUN: 9 mg/dL (ref 6–20)
CO2: 27 mmol/L (ref 22–32)
Calcium: 9.1 mg/dL (ref 8.9–10.3)
Chloride: 108 mmol/L (ref 98–111)
Creatinine: 0.72 mg/dL (ref 0.44–1.00)
GFR, Estimated: >60 mL/min (ref >60)
Glucose, Bld: 92 mg/dL (ref 70–99)
Potassium: 3.7 mmol/L (ref 3.5–5.1)
Sodium: 142 mmol/L (ref 135–145)
Total Bilirubin: 0.3 mg/dL (ref 0.3–1.2)
Total Protein: 6.8 g/dL (ref 6.5–8.1)

## 2021-06-28 MED ORDER — TRASTUZUMAB-DKST CHEMO 150 MG IV SOLR
6.0000 mg/kg | Freq: Once | INTRAVENOUS | Status: AC
  Administered 2021-06-28: 336 mg via INTRAVENOUS
  Filled 2021-06-28: qty 16

## 2021-06-28 MED ORDER — SODIUM CHLORIDE 0.9 % IV SOLN
420.0000 mg | Freq: Once | INTRAVENOUS | Status: AC
  Administered 2021-06-28: 420 mg via INTRAVENOUS
  Filled 2021-06-28: qty 14

## 2021-06-28 MED ORDER — DIPHENHYDRAMINE HCL 25 MG PO CAPS
25.0000 mg | ORAL_CAPSULE | Freq: Once | ORAL | Status: AC
  Administered 2021-06-28: 25 mg via ORAL

## 2021-06-28 MED ORDER — SODIUM CHLORIDE 0.9% FLUSH
10.0000 mL | INTRAVENOUS | Status: DC | PRN
  Administered 2021-06-28: 10 mL
  Filled 2021-06-28: qty 10

## 2021-06-28 MED ORDER — HEPARIN SOD (PORK) LOCK FLUSH 100 UNIT/ML IV SOLN
500.0000 [IU] | Freq: Once | INTRAVENOUS | Status: AC | PRN
  Administered 2021-06-28: 500 [IU]
  Filled 2021-06-28: qty 5

## 2021-06-28 MED ORDER — SODIUM CHLORIDE 0.9 % IV SOLN
Freq: Once | INTRAVENOUS | Status: AC
  Filled 2021-06-28: qty 250

## 2021-06-28 MED ORDER — ACETAMINOPHEN 325 MG PO TABS
ORAL_TABLET | ORAL | Status: AC
  Filled 2021-06-28: qty 2

## 2021-06-28 MED ORDER — DIPHENHYDRAMINE HCL 25 MG PO CAPS
ORAL_CAPSULE | ORAL | Status: AC
  Filled 2021-06-28: qty 2

## 2021-06-28 MED ORDER — SODIUM CHLORIDE 0.9% FLUSH
10.0000 mL | Freq: Once | INTRAVENOUS | Status: AC
  Administered 2021-06-28: 10 mL
  Filled 2021-06-28: qty 10

## 2021-06-28 MED ORDER — ACETAMINOPHEN 325 MG PO TABS
650.0000 mg | ORAL_TABLET | Freq: Once | ORAL | Status: AC
  Administered 2021-06-28: 650 mg via ORAL

## 2021-06-28 MED ORDER — LETROZOLE 2.5 MG PO TABS
2.5000 mg | ORAL_TABLET | Freq: Every day | ORAL | 3 refills | Status: DC
  Filled 2021-06-28: qty 90, 90d supply, fill #0

## 2021-06-28 NOTE — Research (Signed)
ACCRU-[facility]-2102 - TREATMENT OF ESTABLISHED CHEMOTHERAPY-INDUCED NEUROPATHY WITH N-PALMITOYLETHANOLAMIDE, A CANNABIMIMETIC NUTRACEUTICAL: A RANDOMIZED DOUBLE-BLIND PHASE II PILOT TRIAL  Patient Amanda Rich was identified by Dr. Lindi Adie as a potential candidate for the above listed study.  This Clinical Research Coordinator met with JALILAH WILTSIE, [id number], on 06/28/21 in a manner and location that ensures patient privacy to discuss participation in the above listed research study.  Patient is Unaccompanied.  A copy of the informed consent document with embedded HIPAA language was provided to the patient.  Patient reads, speaks, and understands [place].   Patient was provided with the business card of this Coordinator and encouraged to contact the research team with any questions.  Approximately 10 minutes were spent with the patient reviewing the informed consent documents.  Patient was provided the option of taking informed consent documents home to review and was encouraged to review at their convenience with their support network, including other care providers. Patient took the consent documents home to review.  Will follow up with phone call later this week.  Clabe Seal Clinical Research Coordinator I  06/28/21 11:58 AM

## 2021-06-28 NOTE — Patient Instructions (Signed)
[facility] ONCOLOGY  Discharge Instructions: Thank you for choosing [facility] to provide your oncology and hematology care.   If you have a lab appointment with the [facility], please go directly to the [facility] and check in at the registration area.   Wear comfortable clothing and clothing appropriate for easy access to any Portacath or PICC line.   We strive to give you quality time with your provider. You may need to reschedule your appointment if you arrive late (15 or more minutes).  Arriving late affects you and other patients whose appointments are after yours.  Also, if you miss three or more appointments without notifying the office, you may be dismissed from the clinic at the provider's discretion.      For prescription refill requests, have your pharmacy contact our office and allow 72 hours for refills to be completed.    Today you received the following chemotherapy and/or immunotherapy agents trastuzumab/pertuzumab      To help prevent nausea and vomiting after your treatment, we encourage you to take your nausea medication as directed.  BELOW ARE SYMPTOMS THAT SHOULD BE REPORTED IMMEDIATELY: *FEVER GREATER THAN 100.4 F (38 C) OR HIGHER *CHILLS OR SWEATING *NAUSEA AND VOMITING THAT IS NOT CONTROLLED WITH YOUR NAUSEA MEDICATION *UNUSUAL SHORTNESS OF BREATH *UNUSUAL BRUISING OR BLEEDING *URINARY PROBLEMS (pain or burning when urinating, or frequent urination) *BOWEL PROBLEMS (unusual diarrhea, constipation, pain near the anus) TENDERNESS IN MOUTH AND THROAT WITH OR WITHOUT PRESENCE OF ULCERS (sore throat, sores in mouth, or a toothache) UNUSUAL RASH, SWELLING OR PAIN  UNUSUAL VAGINAL DISCHARGE OR ITCHING   Items with * indicate a potential emergency and should be followed up as soon as possible or go to the Emergency Department if any problems should occur.  Please show the CHEMOTHERAPY ALERT CARD or IMMUNOTHERAPY ALERT CARD at  check-in to the Emergency Department and triage nurse.  Should you have questions after your visit or need to cancel or reschedule your appointment, please contact [facility]  Dept: [phone]  and follow the prompts.  Office hours are 8:00 a.m. to 4:30 p.m. Monday - Friday. Please note that voicemails left after 4:00 p.m. may not be returned until the following business day.  We are closed weekends and major holidays. You have access to a nurse at all times for urgent questions. Please call the main number to the clinic Dept: [phone] and follow the prompts.   For any non-urgent questions, you may also contact your provider using MyChart. We now offer e-Visits for anyone 63 and older to request care online for non-urgent symptoms. For details visit [email or web address].   Also download the MyChart app! Go to the app store, search "MyChart", open the app, select [facility], and log in with your MyChart username and password.  Due to Covid, a mask is required upon entering the hospital/clinic. If you do not have a mask, one will be given to you upon arrival. For doctor visits, patients may have 1 support person aged 103 or older with them. For treatment visits, patients cannot have anyone with them due to current Covid guidelines and our immunocompromised population.

## 2021-06-28 NOTE — Progress Notes (Signed)
Per MD ok to treat with last echocardiogram.  Patient recently no showed for last echocardiogram scheduled.   RN rescheduled for 7/15 @ 9am.  Patient notified, and educated importance of keeping appointment to ensure we can continue treatment.

## 2021-06-28 NOTE — Assessment & Plan Note (Signed)
Patient palpated a right breast lump x62yrand a left breast lump x245yrDiagnostic mammogram and USKoreahowed in the right breast, a 2.7cm mass 5cm from the nipple and 0.8cm mass 1cm from the nipple at the 12:30 position, with borderline cortical thickening in the right axilla, and in the left breast, a 4.0cm mass at the 11 o'clock position representing a hamartoma. 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  Treatment plan: 1. Neoadjuvant chemotherapy with [facility]eta 6 cyclescompleted 1/25/2022followed by Herceptin Perjetamaintenance for 1 year 2. Followed by breast conserving surgery if possible with sentinel lymph node study2/08/2021: Complete pathologic response, 0/4lymph nodes negative 3. Followed by adjuvant radiation therapy3/21/22-4/15/22 4.Followed by adjuvant antiestrogen therapy ------------------------------------------------------------------------------------------------------------------------------------------- Current treatment:Maintenance Herceptin and Perjetauntil 08/29/2021 Echocardiogram 09/06/2020: EF 60 to 65%  Toxicities: Mild intermittent diarrhea: Mild hypokalemia:onpotassium containing foods.  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   We will need to initiate therapy with antiestrogen treatments when she comes to see usKoreaext time.  Monitoring closely for toxicities. Return to clinicevery 3 weeks for Herceptin and Perjeta.

## 2021-06-28 NOTE — Progress Notes (Signed)
   Covid-19 Vaccination Clinic  Name:  Amanda Rich    MRN: [id number] DOB: 05/21/1966  06/28/2021  [name] was observed post Covid-19 immunization for 15 minutes without incident. She was provided with Vaccine Information Sheet and instruction to access the V-Safe system.   [name] was instructed to call 911 with any severe reactions post vaccine: Difficulty breathing  Swelling of face and throat  A fast heartbeat  A bad rash all over body  Dizziness and weakness   Immunizations Administered     Name Date Dose VIS Date Route   PFIZER Comrnaty(Gray TOP) Covid-19 Vaccine 06/28/2021  1:41 PM 0.3 mL 12/02/2020 Intramuscular   Manufacturer: [place]   Lot: [id number]   [place]: [phone]

## 2021-06-28 NOTE — Progress Notes (Signed)
Pt declined to stay for 30 min observation post pertuzumab. Pt left ambulatory to lobby in stable condition without any complaints.

## 2021-06-30 ENCOUNTER — Telehealth: Payer: Self-pay | Admitting: Emergency Medicine

## 2021-06-30 NOTE — Telephone Encounter (Signed)
ACCRU--2102 - TREATMENT OF ESTABLISHED CHEMOTHERAPY-INDUCED NEUROPATHY WITH N-PALMITOYLETHANOLAMIDE, A CANNABIMIMETIC NUTRACEUTICAL: A RANDOMIZED DOUBLE-BLIND PHASE II PILOT TRIAL  9:00am:  Called patient to follow up concerning this study.  Patient states she is interested in participating.  She rates neuropathy symptoms at a 7 on scale of 0-10.  Patient denies previous or current use of PEA.  Patient denies current use of opioids, duloxetine, and pregabalin.  The patient is currently taking gabapentin, but states she is willing to stop gabapentin to enroll on this study.  She is aware that gabapentin must be stopped 1 week prior to registration.  Patient also denies any use of cannabis products in the past 4 weeks.  Scheduled appointment at 10:00am tomorrow to obtain consent for this study.  Patient denied further questions at this time.  Clabe Seal Clinical Research Coordinator I  06/30/21  9:29 AM

## 2021-07-01 ENCOUNTER — Other Ambulatory Visit: Payer: Self-pay

## 2021-07-01 ENCOUNTER — Other Ambulatory Visit (HOSPITAL_COMMUNITY): Payer: Self-pay

## 2021-07-01 ENCOUNTER — Encounter: Payer: Self-pay | Admitting: Hematology and Oncology

## 2021-07-01 ENCOUNTER — Inpatient Hospital Stay: Payer: Medicaid Other | Admitting: Emergency Medicine

## 2021-07-01 DIAGNOSIS — C50211 Malignant neoplasm of upper-inner quadrant of right female breast: Secondary | ICD-10-CM

## 2021-07-01 DIAGNOSIS — Z170 Estrogen receptor positive status [ER+]: Secondary | ICD-10-CM

## 2021-07-01 NOTE — Research (Signed)
ACCRU-[place]-2102 - TREATMENT OF ESTABLISHED CHEMOTHERAPY-INDUCED NEUROPATHY WITH N-PALMITOYLETHANOLAMIDE, A CANNABIMIMETIC NUTRACEUTICAL: A RANDOMIZED DOUBLE-BLIND PHASE II PILOT TRIAL  Patient Amanda Rich was identified by Dr. Lindi Adie as a potential candidate for the above listed study.  This Clinical Research Coordinator met with LAKRISHA ISEMAN, MRN [id number] on 07/01/21 in a manner and location that ensures patient privacy to discuss participation in the above listed research study.  Patient is Unaccompanied.  Patient was previously provided with informed consent documents.  Patient confirmed they have read the informed consent documents.  As outlined in the informed consent form, this Coordinator and Arlyn Dunning discussed the purpose of the research study, the investigational nature of the study, study procedures and requirements for study participation, potential risks and benefits of study participation, as well as alternatives to participation.  This study is blinded or double-blinded. The patient understands participation is voluntary and they may withdraw from study participation at any time.  Each study arm was reviewed, and randomization discussed.  Potential side effects were reviewed with patient as outlined in the consent form, and patient made aware there may be side effects not yet known. The chance of receiving placebo was discussed. Patient understands enrollment is pending full eligibility review.   Confidentiality and how the patient's information will be used as part of study participation were discussed.  Patient was informed there is not reimbursement provided for their time and effort spent on trial participation.  The patient is encouraged to discuss research study participation with their insurance provider to determine what costs they may incur as part of study participation, including research related injury.    All questions were answered to patient's satisfaction.   The informed consent with embedded HIPAA language was reviewed page by page.  The patient's mental and emotional status is appropriate to provide informed consent, and the patient verbalizes an understanding of study participation.  Patient has agreed to participate in the above listed research study and has voluntarily signed the informed consent with embedded HIPAA language version 02/11/2021, revised 03/28/2021 on 07/01/21 at 10:20AM.  The patient was provided with a copy of the signed informed consent form with embedded HIPAA language for their reference.  No study specific procedures were obtained prior to the signing of the informed consent document.  Approximately 20 minutes were spent with the patient reviewing the informed consent documents.  Patient was not requested to complete a Release of Information form.  The patient again reports neuropathy symptoms as 7 on scale of 0-10 in the past week.  She reports being postmenopausal with her last menstrual period greater than 12 months ago.  Patient reports that her last dose of gabapentin was Wednesday, 06/29/21.  Will plan to register patient next week after she has not taken gabapentin for at least 7 days.    Clabe Seal Clinical Research Coordinator I  07/01/21 12:14 PM

## 2021-07-07 ENCOUNTER — Telehealth: Payer: Self-pay | Admitting: Emergency Medicine

## 2021-07-07 ENCOUNTER — Other Ambulatory Visit: Payer: Self-pay

## 2021-07-07 ENCOUNTER — Ambulatory Visit (INDEPENDENT_AMBULATORY_CARE_PROVIDER_SITE_OTHER): Payer: Medicaid Other | Admitting: Pulmonary Disease

## 2021-07-07 DIAGNOSIS — J432 Centrilobular emphysema: Secondary | ICD-10-CM

## 2021-07-07 DIAGNOSIS — R0600 Dyspnea, unspecified: Secondary | ICD-10-CM | POA: Diagnosis not present

## 2021-07-07 DIAGNOSIS — R0609 Other forms of dyspnea: Secondary | ICD-10-CM

## 2021-07-07 LAB — PULMONARY FUNCTION TEST
DL/VA % pred: 65 %
DL/VA: 2.79 ml/min/mmHg/L
DLCO cor % pred: 67 %
DLCO cor: 14.57 ml/min/mmHg
DLCO unc % pred: 64 %
DLCO unc: 13.85 ml/min/mmHg
FEF 25-75 Post: 1.3 L/sec
FEF 25-75 Pre: 0.63 L/sec
FEF2575-%Change-Post: 104 %
FEF2575-%Pred-Post: 53 %
FEF2575-%Pred-Pre: 25 %
FEV1-%Change-Post: 27 %
FEV1-%Pred-Post: 71 %
FEV1-%Pred-Pre: 55 %
FEV1-Post: 1.68 L
FEV1-Pre: 1.32 L
FEV1FVC-%Change-Post: 0 %
FEV1FVC-%Pred-Pre: 71 %
FEV6-%Change-Post: 26 %
FEV6-%Pred-Post: 97 %
FEV6-%Pred-Pre: 77 %
FEV6-Post: 2.82 L
FEV6-Pre: 2.23 L
FEV6FVC-%Change-Post: 0 %
FEV6FVC-%Pred-Post: 100 %
FEV6FVC-%Pred-Pre: 101 %
FVC-%Change-Post: 27 %
FVC-%Pred-Post: 97 %
FVC-%Pred-Pre: 76 %
FVC-Post: 2.89 L
FVC-Pre: 2.27 L
Post FEV1/FVC ratio: 58 %
Post FEV6/FVC ratio: 98 %
Pre FEV1/FVC ratio: 58 %
Pre FEV6/FVC Ratio: 98 %
RV % pred: 173 %
RV: 3.27 L
TLC % pred: 121 %
TLC: 6.3 L

## 2021-07-07 NOTE — Progress Notes (Signed)
PFT done today. 

## 2021-07-07 NOTE — Telephone Encounter (Signed)
ACCRU-[place]-2102 - TREATMENT OF ESTABLISHED CHEMOTHERAPY-INDUCED NEUROPATHY WITH N-PALMITOYLETHANOLAMIDE, A CANNABIMIMETIC NUTRACEUTICAL: A RANDOMIZED DOUBLE-BLIND PHASE II PILOT TRIAL  1:55pm: Called to review next steps for this study.  Rescheduled research appointment on Monday 07/11/21 to 2:30pm.  Patient states she is not able to review symptoms and medication at this time.  She requests call back tomorrow to review medications and symptom for final eligibility review.  Clabe Seal Clinical Research Coordinator I  07/07/21  2:04 PM

## 2021-07-08 ENCOUNTER — Telehealth: Payer: Self-pay | Admitting: Emergency Medicine

## 2021-07-08 ENCOUNTER — Ambulatory Visit (HOSPITAL_COMMUNITY)
Admission: RE | Admit: 2021-07-08 | Discharge: 2021-07-08 | Disposition: A | Discharge status: Home / Self Care | Payer: Medicaid Other | Source: Ambulatory Visit | Attending: Hematology and Oncology | Admitting: Hematology and Oncology

## 2021-07-08 DIAGNOSIS — Z0189 Encounter for other specified special examinations: Secondary | ICD-10-CM

## 2021-07-08 DIAGNOSIS — Z01818 Encounter for other preprocedural examination: Secondary | ICD-10-CM | POA: Diagnosis not present

## 2021-07-08 DIAGNOSIS — R079 Chest pain, unspecified: Secondary | ICD-10-CM | POA: Insufficient documentation

## 2021-07-08 DIAGNOSIS — C50919 Malignant neoplasm of unspecified site of unspecified female breast: Secondary | ICD-10-CM | POA: Insufficient documentation

## 2021-07-08 DIAGNOSIS — E785 Hyperlipidemia, unspecified: Secondary | ICD-10-CM | POA: Insufficient documentation

## 2021-07-08 DIAGNOSIS — Z5181 Encounter for therapeutic drug level monitoring: Secondary | ICD-10-CM

## 2021-07-08 DIAGNOSIS — F17200 Nicotine dependence, unspecified, uncomplicated: Secondary | ICD-10-CM | POA: Diagnosis not present

## 2021-07-08 DIAGNOSIS — Z79899 Other long term (current) drug therapy: Secondary | ICD-10-CM | POA: Diagnosis not present

## 2021-07-08 LAB — ECHOCARDIOGRAM COMPLETE
Area-P 1/2: 4.06 cm2
Calc EF: 58.6 %
S' Lateral: 3.5 cm
Single Plane A2C EF: 66.9 %
Single Plane A4C EF: 51.7 %

## 2021-07-08 NOTE — Telephone Encounter (Signed)
[facility]CRU-[place]-2102 - TREATMENT OF ESTABLISHED CHEMOTHERAPY-INDUCED NEUROPATHY WITH N-PALMITOYLETHANOLAMIDE, A CANNABIMIMETIC NUTRACEUTICAL: A RANDOMIZED DOUBLE-BLIND PHASE II PILOT TRIAL  11:00am: Confirmed appointment at 2:30pm on Monday 07/11/21.  Patient reports neuropathy symptoms at 7 on scale of 0-10 in the past week.  She has not taken any gabapentin in the past week.  She denies taking PEA, cannabis products, opioids, duloxetine, or pregabalin.  Patient is able to swallow oral medications.  Patient denied questions at this time.  Clabe Seal Clinical Research Coordinator I  07/08/21  11:01 AM

## 2021-07-08 NOTE — Progress Notes (Signed)
  Echocardiogram 2D Echocardiogram has been performed.  Amanda Rich 07/08/2021, 10:33 AM

## 2021-07-11 ENCOUNTER — Other Ambulatory Visit: Payer: Self-pay | Admitting: Emergency Medicine

## 2021-07-11 ENCOUNTER — Telehealth: Payer: Self-pay | Admitting: Emergency Medicine

## 2021-07-11 ENCOUNTER — Other Ambulatory Visit: Payer: Self-pay

## 2021-07-11 ENCOUNTER — Other Ambulatory Visit: Payer: Self-pay | Admitting: Hematology and Oncology

## 2021-07-11 ENCOUNTER — Encounter: Payer: Self-pay | Admitting: Emergency Medicine

## 2021-07-11 ENCOUNTER — Encounter: Payer: Medicaid Other | Admitting: Emergency Medicine

## 2021-07-11 ENCOUNTER — Inpatient Hospital Stay: Payer: Medicaid Other | Admitting: Emergency Medicine

## 2021-07-11 DIAGNOSIS — C50211 Malignant neoplasm of upper-inner quadrant of right female breast: Secondary | ICD-10-CM

## 2021-07-11 DIAGNOSIS — Z170 Estrogen receptor positive status [ER+]: Secondary | ICD-10-CM

## 2021-07-11 MED ORDER — INV-PALMITOYLETHANOLAMIDE/PLACEBO 400 MG CAPS ACCRU-~~LOC~~-2102: ORAL_CAPSULE | ORAL | 0 refills | Status: DC

## 2021-07-11 NOTE — Research (Signed)
TRIAL: ACCRU-[place]-2102 - TREATMENT OF ESTABLISHED CHEMOTHERAPY-INDUCED NEUROPATHY WITH N-PALMITOYLETHANOLAMIDE, A CANNABIMIMETIC NUTRACEUTICAL: A RANDOMIZED DOUBLE-BLIND PHASE II PILOT TRIAL   MEDICATION DISTRIBUTION:  Confirmed study drug bottle patient labels and pill counts with PharmD Raul Del before distributing to patient.  Two pill bottles with 30 pills in each (60 total) were provided to patient.  Patient was instructed to start taking study drug tomorrow (07/12/21) morning, taking once daily every morning with food.  Pt verbalized understanding, aware to contact MD Gud[place]/Research with any questions/concerns.  Witnessed/confirmed by Clinical Research Coordinator Clabe Seal who is in charge of visit today, see his note for rest of visit information.  Wells Guiles '[place], RN, BSN Clinical Research Nurse I 07/11/21 3:48 PM

## 2021-07-11 NOTE — Research (Signed)
ACCRU-[facility]-2102 - TREATMENT OF ESTABLISHED CHEMOTHERAPY-INDUCED NEUROPATHY WITH N-PALMITOYLETHANOLAMIDE, A CANNABIMIMETIC NUTRACEUTICAL: A RANDOMIZED DOUBLE-BLIND PHASE II PILOT TRIAL  Eligibility:  This Coordinator has reviewed this patient's inclusion and exclusion criteria and confirmed Amanda Rich is eligible for study participation.  Patient will continue with enrollment.  Menopausal status (women only): Amanda Rich is post menopausal.  Registration: Patient was registered to this study today, 07/11/2021.  PROs: Baseline questionnaires were completed today in office.  This coordinator reviewed for completeness.  Medications:  Reviewed patient's medication list.  Patient states she has not taken any gabapentin in the past week.  She is also no longer taking melatonin.  Patient plans to start vitamin D and calcium supplements.  Study Drug: Patient was randomized to take medication once daily.  Study medication was dispensed from pharmacy by Raul Del, pharmacist to Ruben Im, RN.  Medication was given to the patient and she was instructed to take one capsule once daily in the morning.  Patient agrees to start medication tomorrow.  Plan: Will call patient in one week to complete first weekly phone call.  Patient was asked to call with any questions or concerns.     Clabe Seal Clinical Research Coordinator I  07/11/21 2:59 PM

## 2021-07-11 NOTE — Research (Signed)
[facility]CRU-[place]-2102 - TREATMENT OF ESTABLISHED CHEMOTHERAPY-INDUCED NEUROPATHY WITH N-PALMITOYLETHANOLAMIDE, A CANNABIMIMETIC NUTRACEUTICAL: A RANDOMIZED DOUBLE-BLIND PHASE II PILOT TRIAL  This Nurse has reviewed this patient's inclusion and exclusion criteria as a second review and confirms Amanda Rich is eligible for study participation.  Patient may continue with enrollment.  Wells Guiles '[place], RN, BSN Clinical Research Nurse I 07/11/21 10:04 AM

## 2021-07-12 ENCOUNTER — Telehealth: Payer: Self-pay | Admitting: Hematology and Oncology

## 2021-07-12 ENCOUNTER — Encounter: Payer: Self-pay | Admitting: Hematology and Oncology

## 2021-07-12 NOTE — Telephone Encounter (Signed)
Encounter opened in error

## 2021-07-12 NOTE — Telephone Encounter (Signed)
R/s appts per 7/19 inbasket msg. Pt aware.

## 2021-07-18 ENCOUNTER — Other Ambulatory Visit: Payer: Self-pay

## 2021-07-18 ENCOUNTER — Inpatient Hospital Stay: Payer: Medicaid Other

## 2021-07-18 ENCOUNTER — Ambulatory Visit: Payer: Medicaid Other

## 2021-07-18 VITALS — BP 128/87 | HR 68 | Temp 97.8°F | Resp 18 | Wt 121.0 lb

## 2021-07-18 DIAGNOSIS — Z170 Estrogen receptor positive status [ER+]: Secondary | ICD-10-CM

## 2021-07-18 DIAGNOSIS — C50211 Malignant neoplasm of upper-inner quadrant of right female breast: Secondary | ICD-10-CM | POA: Diagnosis not present

## 2021-07-18 DIAGNOSIS — Z5112 Encounter for antineoplastic immunotherapy: Secondary | ICD-10-CM | POA: Diagnosis not present

## 2021-07-18 DIAGNOSIS — Z79899 Other long term (current) drug therapy: Secondary | ICD-10-CM | POA: Diagnosis not present

## 2021-07-18 DIAGNOSIS — E876 Hypokalemia: Secondary | ICD-10-CM | POA: Diagnosis not present

## 2021-07-18 MED ORDER — FAMOTIDINE 20 MG IN NS 100 ML IVPB: 20.0000 mg | Freq: Once | INTRAVENOUS | Status: DC | PRN

## 2021-07-18 MED ORDER — DIPHENHYDRAMINE HCL 25 MG PO CAPS
ORAL_CAPSULE | ORAL | Status: AC
  Filled 2021-07-18: qty 1

## 2021-07-18 MED ORDER — SODIUM CHLORIDE 0.9 % IV SOLN
6.0000 mg/kg | Freq: Once | INTRAVENOUS | Status: AC
  Administered 2021-07-18: 336 mg via INTRAVENOUS
  Filled 2021-07-18: qty 16

## 2021-07-18 MED ORDER — DIPHENHYDRAMINE HCL 25 MG PO CAPS
25.0000 mg | ORAL_CAPSULE | Freq: Once | ORAL | Status: AC
  Administered 2021-07-18: 25 mg via ORAL

## 2021-07-18 MED ORDER — ALTEPLASE 2 MG IJ SOLR
2.0000 mg | Freq: Once | INTRAMUSCULAR | Status: DC | PRN
  Filled 2021-07-18: qty 2

## 2021-07-18 MED ORDER — HEPARIN SOD (PORK) LOCK FLUSH 100 UNIT/ML IV SOLN
500.0000 [IU] | Freq: Once | INTRAVENOUS | Status: AC | PRN
  Administered 2021-07-18: 500 [IU]
  Filled 2021-07-18: qty 5

## 2021-07-18 MED ORDER — METHYLPREDNISOLONE SODIUM SUCC 125 MG IJ SOLR: 125.0000 mg | Freq: Once | INTRAMUSCULAR | Status: DC | PRN

## 2021-07-18 MED ORDER — DIPHENHYDRAMINE HCL 50 MG/ML IJ SOLN: 50.0000 mg | Freq: Once | INTRAMUSCULAR | Status: DC | PRN

## 2021-07-18 MED ORDER — SODIUM CHLORIDE 0.9 % IV SOLN
Freq: Once | INTRAVENOUS | Status: AC
  Filled 2021-07-18: qty 250

## 2021-07-18 MED ORDER — ALBUTEROL SULFATE (2.5 MG/3ML) 0.083% IN NEBU
2.5000 mg | INHALATION_SOLUTION | Freq: Once | RESPIRATORY_TRACT | Status: DC | PRN
  Filled 2021-07-18: qty 3

## 2021-07-18 MED ORDER — SODIUM CHLORIDE 0.9 % IV SOLN
420.0000 mg | Freq: Once | INTRAVENOUS | Status: AC
  Administered 2021-07-18: 420 mg via INTRAVENOUS
  Filled 2021-07-18: qty 14

## 2021-07-18 MED ORDER — ACETAMINOPHEN 325 MG PO TABS
650.0000 mg | ORAL_TABLET | Freq: Once | ORAL | Status: AC
  Administered 2021-07-18: 650 mg via ORAL

## 2021-07-18 MED ORDER — SODIUM CHLORIDE 0.9 % IV SOLN
Freq: Once | INTRAVENOUS | Status: DC | PRN
  Filled 2021-07-18: qty 250

## 2021-07-18 MED ORDER — EPINEPHRINE 0.3 MG/0.3ML IJ SOAJ: 0.3000 mg | Freq: Once | INTRAMUSCULAR | Status: DC | PRN

## 2021-07-18 MED ORDER — SODIUM CHLORIDE 0.9% FLUSH
3.0000 mL | INTRAVENOUS | Status: DC | PRN
  Filled 2021-07-18: qty 10

## 2021-07-18 MED ORDER — HEPARIN SOD (PORK) LOCK FLUSH 100 UNIT/ML IV SOLN
250.0000 [IU] | Freq: Once | INTRAVENOUS | Status: DC | PRN
  Filled 2021-07-18: qty 5

## 2021-07-18 MED ORDER — ACETAMINOPHEN 325 MG PO TABS
ORAL_TABLET | ORAL | Status: AC
  Filled 2021-07-18: qty 2

## 2021-07-18 MED ORDER — SODIUM CHLORIDE 0.9% FLUSH
10.0000 mL | INTRAVENOUS | Status: DC | PRN
  Administered 2021-07-18: 10 mL
  Filled 2021-07-18: qty 10

## 2021-07-18 NOTE — Patient Instructions (Signed)
[facility]  Discharge Instructions: Thank you for choosing [facility] to provide your oncology and hematology care.   If you have a lab appointment with the [facility], please go directly to the [facility] and check in at the registration area.   Wear comfortable clothing and clothing appropriate for easy access to any Portacath or PICC line.   We strive to give you quality time with your provider. You may need to reschedule your appointment if you arrive late (15 or more minutes).  Arriving late affects you and other patients whose appointments are after yours.  Also, if you miss three or more appointments without notifying the office, you may be dismissed from the clinic at the provider's discretion.      For prescription refill requests, have your pharmacy contact our office and allow 72 hours for refills to be completed.    Today you received the following chemotherapy and/or immunotherapy agents :Perjeta, Herceptin   To help prevent nausea and vomiting after your treatment, we encourage you to take your nausea medication as directed.  BELOW ARE SYMPTOMS THAT SHOULD BE REPORTED IMMEDIATELY: *FEVER GREATER THAN 100.4 F (38 C) OR HIGHER *CHILLS OR SWEATING *NAUSEA AND VOMITING THAT IS NOT CONTROLLED WITH YOUR NAUSEA MEDICATION *UNUSUAL SHORTNESS OF BREATH *UNUSUAL BRUISING OR BLEEDING *URINARY PROBLEMS (pain or burning when urinating, or frequent urination) *BOWEL PROBLEMS (unusual diarrhea, constipation, pain near the anus) TENDERNESS IN MOUTH AND THROAT WITH OR WITHOUT PRESENCE OF ULCERS (sore throat, sores in mouth, or a toothache) UNUSUAL RASH, SWELLING OR PAIN  UNUSUAL VAGINAL DISCHARGE OR ITCHING   Items with * indicate a potential emergency and should be followed up as soon as possible or go to the Emergency Department if any problems should occur.  Please show the CHEMOTHERAPY ALERT CARD or IMMUNOTHERAPY ALERT CARD at  check-in to the Emergency Department and triage nurse.  Should you have questions after your visit or need to cancel or reschedule your appointment, please contact [facility]  Dept: [phone]  and follow the prompts.  Office hours are 8:00 a.m. to 4:30 p.m. Monday - Friday. Please note that voicemails left after 4:00 p.m. may not be returned until the following business day.  We are closed weekends and major holidays. You have access to a nurse at all times for urgent questions. Please call the main number to the clinic Dept: [phone] and follow the prompts.   For any non-urgent questions, you may also contact your provider using MyChart. We now offer e-Visits for anyone 3 and older to request care online for non-urgent symptoms. For details visit [email or web address].   Also download the MyChart app! Go to the app store, search "MyChart", open the app, select [facility], and log in with your MyChart username and password.  Due to Covid, a mask is required upon entering the hospital/clinic. If you do not have a mask, one will be given to you upon arrival. For doctor visits, patients may have 1 support person aged 28 or older with them. For treatment visits, patients cannot have anyone with them due to current Covid guidelines and our immunocompromised population.

## 2021-07-19 ENCOUNTER — Encounter: Payer: Self-pay | Admitting: Emergency Medicine

## 2021-07-19 ENCOUNTER — Telehealth: Payer: Self-pay | Admitting: Emergency Medicine

## 2021-07-19 DIAGNOSIS — Z170 Estrogen receptor positive status [ER+]: Secondary | ICD-10-CM

## 2021-07-19 DIAGNOSIS — C50211 Malignant neoplasm of upper-inner quadrant of right female breast: Secondary | ICD-10-CM

## 2021-07-19 NOTE — Telephone Encounter (Signed)
Encounter opened in error

## 2021-07-19 NOTE — Research (Signed)
ACCRU-[place]-2102 - TREATMENT OF ESTABLISHED CHEMOTHERAPY-INDUCED NEUROPATHY WITH N-PALMITOYLETHANOLAMIDE, A CANNABIMIMETIC NUTRACEUTICAL: A RANDOMIZED DOUBLE-BLIND PHASE II PILOT TRIAL  07/19/21 - Week 1 Telephone Call  11:30am: Called patient to complete 1st weekly telephone call for this study.    Questionnaires: Patient states she has completed her week 1 questionnaires and mailed to the research department them this morning.  Study Compliance:  She reports taking the medication 7 out of 7 days.   She denies using CBD or THC.    Adverse Events:  She reports some nausea which she states has not changed since starting study medication last week on 07/12/21.  Patient reports her nausea symptoms starting after beginning letrozole on 07/04/2021.  She first noticed nausea the morning after taking letrozole.  This nausea has improved since she has started taking letrozole with food.  Patient states nausea has not decreased her eating habits.  Patient denies vomiting or diarrhea in the past week.    Patients symptoms were reviewed with Dr. Jana Hakim on 07/19/21 who attributed nausea as unlikely related to study intervention.  Event Grade Onset Date Resolved Date Attribution Treatment Comments  Nausea 1 07/04/21 Ongoing Unlikely None Started     Plan: The patient was encouraged to continue taking the study medication daily and fill out questionnaires weekly.  Patient verbalized understanding.  She denied questions at this time and is aware to expect a phone call in one week.  She was thanked for her time and participation.   Clabe Seal Clinical Research Coordinator I  07/19/21 11:43 AM

## 2021-07-25 ENCOUNTER — Telehealth: Payer: Self-pay | Admitting: Emergency Medicine

## 2021-07-25 DIAGNOSIS — Z419 Encounter for procedure for purposes other than remedying health state, unspecified: Secondary | ICD-10-CM | POA: Diagnosis not present

## 2021-07-25 NOTE — Telephone Encounter (Signed)
ACCRU-[facility]-2102 - TREATMENT OF ESTABLISHED CHEMOTHERAPY-INDUCED NEUROPATHY WITH N-PALMITOYLETHANOLAMIDE, A CANNABIMIMETIC NUTRACEUTICAL: A RANDOMIZED DOUBLE-BLIND PHASE II PILOT TRIAL  07/25/21 - Week 2 phone call  9:53am - Called patient but no answer, left voicemail requesting return call.  4:15pm - Called again, no answer.  Left voicemail requesting return call with contact information for Remer Macho, Research Specialist, as this coordinator will be out of office the remainder of this week.  Clabe Seal Clinical Research Coordinator I  07/25/21  4:45 PM

## 2021-07-27 ENCOUNTER — Telehealth: Payer: Self-pay

## 2021-07-27 DIAGNOSIS — T451X5A Adverse effect of antineoplastic and immunosuppressive drugs, initial encounter: Secondary | ICD-10-CM

## 2021-07-27 DIAGNOSIS — G620 Drug-induced polyneuropathy: Secondary | ICD-10-CM

## 2021-07-27 NOTE — Telephone Encounter (Addendum)
ACCRU-[facility]-2102 - TREATMENT OF ESTABLISHED CHEMOTHERAPY-INDUCED NEUROPATHY WITH N-PALMITOYLETHANOLAMIDE, A CANNABIMIMETIC NUTRACEUTICAL: A RANDOMIZED DOUBLE-BLIND PHASE II PILOT TRIAL      WEEK 2 PHONE CALL: Patient was contacted via phone today to complete study week 2 phone call. Identity was confirmed using two patient identifiers.    QUESTIONNAIRE: Patient has completed their weekly questionnaire and has returned the completed form using the provided envelope.   INVESTIGATIONAL PRODUCT: Patient reports taking 7  capsules since 07/19/2021. Patient is always compliant with study medication. She takes her study medication in the morning at 0830 am with food per protocol requirements. Patient states she has this on an alarm to remind her of the time.   ADVERSE EFFECTS: Patient Denies adverse effects from the study drug.  FOLLOW-UP: Patient Reports concerns at this time of hand numbness, foot cramping with contractures, difficulty picking up items, difficulty with her gait, fear of falling. States this has not changed since she began taking her study drug and is still pretty much the same as it was prior to IP. She states her pain continues to be at a 7-8 when her foot contracts. Patient agrees to next follow-up phone call on 08/03/2021 anytime.   NEUROPATHY STATUS: Patient Reports neuropathy symptoms and Denies taking the following medications for neuropathy symptoms listed above in follow up; CBD, THC, or Opiods.   The patient was thanked for their time and continued voluntary participation in this study. Patient has been provided direct contact information and is encouraged to contact this Nurse for any needs or questi Jeral Fruit, RN 07/27/21 12:03 PM

## 2021-08-01 ENCOUNTER — Other Ambulatory Visit: Payer: Self-pay

## 2021-08-01 ENCOUNTER — Ambulatory Visit: Payer: Medicaid Other | Attending: Surgery

## 2021-08-01 DIAGNOSIS — C50411 Malignant neoplasm of upper-outer quadrant of right female breast: Secondary | ICD-10-CM | POA: Diagnosis not present

## 2021-08-01 DIAGNOSIS — Z95828 Presence of other vascular implants and grafts: Secondary | ICD-10-CM | POA: Diagnosis not present

## 2021-08-01 DIAGNOSIS — Z483 Aftercare following surgery for neoplasm: Secondary | ICD-10-CM | POA: Insufficient documentation

## 2021-08-01 DIAGNOSIS — Z170 Estrogen receptor positive status [ER+]: Secondary | ICD-10-CM | POA: Diagnosis not present

## 2021-08-01 NOTE — Therapy (Signed)
[place], [place], [address] Phone: [phone]   Fax:  [phone]  Physical Therapy Treatment  Patient Details  Name: Amanda Rich MRN: [id number] Date of Birth: [birth year] Referring Provider (PT): Dr. [name]   Encounter Date: 08/01/2021   PT End of Session - 08/01/21 0850     Visit Number 2   # unchanged due to screen only   PT Start Time 0841    PT Stop Time 0850    PT Time Calculation (min) 9 min    Activity Tolerance Patient tolerated treatment well    Behavior During Therapy [facility] for tasks assessed/performed             Past Medical History:  Diagnosis Date   Anemia    Cancer ([place])    breast cancer   Depression    Family history of breast cancer    Family history of liver cancer    History of radiation therapy 03/10/21-04/11/21   Right breast- Dr. Gery Pray   Pericarditis    Smoker    Umbilical hernia     Past Surgical History:  Procedure Laterality Date   ABLATION ON ENDOMETRIOSIS     BREAST LUMPECTOMY WITH RADIOACTIVE SEED AND SENTINEL LYMPH NODE BIOPSY Right 02/02/2021   Procedure: RIGHT BREAST LUMPECTOMY WITH RADIOACTIVE SEED AND RIGHT SENTINEL LYMPH NODE [place];  Surgeon: [name], MD;  Location: [facility];  Service: General;  Laterality: Right;   GANGLION CYST EXCISION  2007   L wrist; [facility], [place]   IR IMAGING GUIDED PORT INSERTION  09/10/2020   TUBAL LIGATION     [facility]   UMBILICAL HERNIA REPAIR  2005   [facility]    There were no vitals filed for this visit.   Subjective Assessment - 08/01/21 0845     Subjective pt is here for 3 month SOZO screen    Pertinent History Patient was diagnosed on 08/17/2020 with right grade III invasive ductal carcinoma breast cancer. She underwent chemotherapy from 09/13/2020 - 01/18/2021. Then she had a right lumpectomy and sentinel node biopsy (4 negative nodes) on 02/02/2021.  It is ER positive, PR negative,  andHER2 positive with a Ki67 of 30%. She has a history of a heart attack in 12/2018.                    L-DEX FLOWSHEETS - 08/01/21 0800       L-DEX LYMPHEDEMA SCREENING   Measurement Type Unilateral    L-DEX MEASUREMENT EXTREMITY Upper Extremity    POSITION  Standing    DOMINANT SIDE Right    At Risk Side Right    BASELINE SCORE (UNILATERAL) -2.7    L-DEX SCORE (UNILATERAL) -1.2    VALUE CHANGE (UNILAT) 1.5                                    PT Long Term Goals - 03/03/21 3149       PT LONG TERM GOAL #1   Title Patient will demonstrate she has regained full shoulder ROM and function post operatively compared to baselines.    Time 6    Period Months    Status Achieved                   Plan - 08/01/21 7026     Clinical Impression Statement Pt returns for her 3  month L-Dex screen. Her change from baseline of 1.5 is WNLs so no further lymphedema treatment is required at this time. She does report she has been having some discomfort and pain at her Rt shoulder and sees Dr. Brantley Stage tomorrow. Suggested she speak with him about this to see if her will refer her for physical therapy which pt was agreeable to.    PT Next Visit Plan Cont every 3 month L-Dex screens for up to 2 years from her SLNB.    Consulted and Agree with Plan of Care Patient             Patient will benefit from skilled therapeutic intervention in order to improve the following deficits and impairments:     Visit Diagnosis: Aftercare following surgery for neoplasm     Problem List Patient Active Problem List   Diagnosis Date Noted   Family history of breast cancer    Family history of liver cancer    Malignant neoplasm of 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, estrogen receptor positive ([place]) 08/25/2020   Abnormal chest CT 01/30/2019   History of pericarditis 01/30/2019   Pericarditis 01/22/2019   Chest pain 12/26/2018   History of endometrial  ablation 01/21/2018   Family history of colon cancer 09/24/2017   Poor diet 01/24/2017   Weight Rich, abnormal 01/24/2017   Smoker 09/22/2016   Mixed hyperlipidemia 08/07/2016   Irregular intermenstrual bleeding 08/07/2016   Depression 08/07/2016   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

## 2021-08-03 ENCOUNTER — Encounter: Payer: Self-pay | Admitting: Emergency Medicine

## 2021-08-03 DIAGNOSIS — C50211 Malignant neoplasm of upper-inner quadrant of right female breast: Secondary | ICD-10-CM

## 2021-08-03 DIAGNOSIS — Z170 Estrogen receptor positive status [ER+]: Secondary | ICD-10-CM

## 2021-08-03 NOTE — Research (Signed)
ACCRU-[place]-2102 - TREATMENT OF ESTABLISHED CHEMOTHERAPY-INDUCED NEUROPATHY WITH N-PALMITOYLETHANOLAMIDE, A CANNABIMIMETIC NUTRACEUTICAL: A RANDOMIZED DOUBLE-BLIND PHASE II PILOT TRIAL  08/03/21 - Week 3 Telephone Call  11:00am: Called patient to complete 3rd weekly telephone call for this study.    Questionnaires: Patient states she completed her week 3 questionnaires and has mailed them to our office.  Patient confirmed that she has not missed any doses of study drug since starting the study.  Patient answered as missing on a dose of the study medication "more than 5 times" on her week 1 questionnaire.  Patient confirmed that this was selected in error and "0 times" should have been selected on this questionnaire.  Study Compliance:  She reports taking the medication always (7 out of 7 days) in the past week.   She denies using CBD or THC.    Neuropathy Symptoms: Patient states he neuropathy symptoms are worsening and she has not found relief since starting the study drug.  Patient is planning to discuss treatment options with Dr. Lindi Adie on Monday at her scheduled appointment.  Adverse Events:  She reports no change in her nausea.  She states this continues to occur after taking letrozole, specifically if she takes it without food.  Patient denies other adverse events at this time.  Event Grade Onset Date Resolved Date Attribution Treatment Comments  Nausea 1 07/04/21 Ongoing Unlikely None Began when patient started taking letrozole    Plan: The patient was encouraged to continue taking the study medication daily and fill out questionnaires weekly.  Patient verbalized understanding.  Will follow up with the patient in person at her scheduled visit on Monday 08/08/21.  She was thanked for her time and participation.   Clabe Seal Clinical Research Coordinator I  08/03/21 10:49 AM

## 2021-08-07 NOTE — Progress Notes (Signed)
Patient Care Team: Girtha Rm, NP-C as PCP - General (Family Medicine) Sueanne Margarita, MD as PCP - Cardiology (Cardiology) Rockwell Germany, RN as Oncology Nurse Navigator [name], RN as Oncology Nurse Navigator [name], MD as Consulting Physician (General Surgery) Nicholas Lose, MD as Consulting Physician (Hematology and Oncology) Gery Pray, MD as Consulting Physician (Radiation Oncology)  DIAGNOSIS:    ICD-10-CM   1. Malignant neoplasm of 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, estrogen receptor positive ([place])  C50.211    Z17.0       SUMMARY OF ONCOLOGIC HISTORY: Oncology History  Malignant neoplasm of 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, estrogen receptor positive ([place])  08/25/2020 Initial Diagnosis   Patient palpated a right breast lump x55yrand a left breast lump x251yrDiagnostic mammogram and USKoreahowed in the right breast, a 2.7cm mass 5cm from the nipple and 0.8cm mass 1cm from the nipple at the 12:30 position, with borderline cortical thickening in the right axilla, and in the left breast, a 4.0cm mass at the 11 o'clock position representing a hamartoma. 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   09/13/2020 - 01/18/2021 Chemotherapy   [facility]        02/02/2021 Surgery   Right lumpectomy ([facility]): no residual carcinoma, 4 right axillary lymph nodes negative for carcinoma.   02/21/2021 -  Chemotherapy      Patient is on Antibody Plan: BREAST TRASTUZUMAB + PERTUZUMAB Q21D     03/11/2021 - 04/11/2021 Radiation Therapy   Adjuvant radiation     CHIEF COMPLIANT: Adjuvant maintenance Herceptin and Perjeta   INTERVAL HISTORY: Amanda TAIRAs a [age].o. with above-mentioned history of right breast cancer treated with neoadjuvant chemotherapy followed  lumpectomy and radiation and is currently on Herceptin and Perjeta maintenance. She presents to the clinic today for cycle 9.  She has 1 more treatment left of the Herceptin and Perjeta.  She was started on the neuropathy clinical trial and she tells me that with the study drug she has not noticed any improvement.  In fact her neuropathy has gotten worse.  She rates it as 8 out of 10.  She wishes to go back on gabapentin.  She would like to discontinue the study drug at this time.  ALLERGIES:  is allergic to clarithromycin.  MEDICATIONS:  Current Outpatient Medications  Medication Sig Dispense Refill   Cholecalciferol (VITAMIN D) 50 MCG (2000 UT) CAPS Take 1 capsule (2,000 Units total) by mouth daily. 30 capsule    albuterol (VENTOLIN HFA) 108 (90 Base) MCG/ACT inhaler Inhale 2 puffs into the lungs every 6 (six) hours as needed for wheezing or shortness of breath. 8.5 g 0   budesonide-formoterol (SYMBICORT) 80-4.5 MCG/ACT inhaler Inhale 2 puffs into the lungs in the morning and at bedtime. 10.2 g 12   citalopram (CELEXA) 10 MG tablet Take 1 tablet (10 mg total) by mouth daily. 90 tablet 3   gabapentin (NEURONTIN) 300 MG capsule TAKE 1 CAPSULE BY MOUTH AT BEDTIME 90 capsule 3   ibuprofen (ADVIL) 800 MG tablet TAKE 1 TABLET BY MOUTH EVERY 8 HOURS AS NEEDED 30 tablet 0   letrozole (FEMARA) 2.5 MG tablet Take 1 tablet (2.5 mg total) by mouth daily. 90 tablet 3   nicotine (NICODERM CQ - DOSED IN  MG/24 HR) 7 mg/24hr patch PLACE 1 PATCH ONTO THE SKIN DAILY. 28 patch 0   No current facility-administered medications for this visit.    PHYSICAL EXAMINATION: ECOG PERFORMANCE STATUS: 2 - Symptomatic, <50% confined to bed  Vitals:   08/08/21 0944  BP: 111/74  Pulse: 83  Resp: 18  Temp: (!) 97.5 F (36.4 C)  SpO2: 98%   Filed Weights   08/08/21 0944  Weight: 120 lb 3.2 oz (54.5 kg)     LABORATORY DATA:  I have reviewed the data as listed CMP Latest Ref Rng & Units 08/08/2021 06/28/2021  05/16/2021  Glucose 70 - 99 mg/dL 103(H) 92 94  BUN 6 - 20 mg/dL 11 9 10   Creatinine 0.44 - 1.00 mg/dL 0.74 0.72 0.70  Sodium 135 - 145 mmol/L 141 142 141  Potassium 3.5 - 5.1 mmol/L 3.7 3.7 3.5  Chloride 98 - 111 mmol/L 106 108 108  CO2 22 - 32 mmol/L 26 27 28   Calcium 8.9 - 18.5 mg/dL 9.5 9.1 9.4  Total Protein 6.5 - 8.1 g/dL 7.1 6.8 6.8  Total Bilirubin 0.3 - 1.2 mg/dL 0.4 0.3 0.5  Alkaline Phos 38 - 126 U/L 70 77 72  AST 15 - 41 U/L 18 20 19   ALT 0 - 44 U/L 14 16 12     Lab Results  Component Value Date   WBC 6.0 08/08/2021   HGB 12.0 08/08/2021   HCT 36.2 08/08/2021   MCV 86.2 08/08/2021   PLT 195 08/08/2021   NEUTROABS 3.0 08/08/2021    ASSESSMENT & PLAN:  Malignant neoplasm of 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, estrogen receptor positive ([place]) Patient palpated a right breast lump x64yrand a left breast lump x235yrDiagnostic mammogram and USKoreahowed in the right breast, a 2.7cm mass 5cm from the nipple and 0.8cm mass 1cm from the nipple at the 12:30 position, with borderline cortical thickening in the right axilla, and in the left breast, a 4.0cm mass at the 11 o'clock position representing a hamartoma. 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   Treatment plan: 1. Neoadjuvant chemotherapy with TCH Perjeta 6 cycles completed 01/18/2021 followed by Herceptin Perjeta maintenance for 1 year 2. Followed by breast conserving surgery if possible with sentinel lymph node study 02/02/2021: Complete pathologic response, 0/4 lymph nodes negative 3. Followed by adjuvant radiation therapy 03/14/21- 04/08/21 4.  Followed by adjuvant antiestrogen therapy with letrozole started 06/28/2021 ------------------------------------------------------------------------------------------------------------------------------------------- Current  treatment: Maintenance Herceptin and Perjeta until 08/29/2021 Echocardiogram 09/06/2020: EF 60 to 65%   Toxicities: Mild intermittent diarrhea: Mild hypokalemia: on potassium containing foods.  Muscle aches and pains: I instructed her to stop letrozole at this time.  I will see her back in 3 weeks and reassess.  If her symptoms improve then we can switch her to anastrozole.  Neuropathy: She was on the clinical trial for neuropathy with PEM did not find that it helped her.  The clinical trial has a placebo arm as well.  So it is unclear if she got the study drug or placebo.  She would like to go back to gabapentin.  She does not want to continue the full 8 weeks of therapy.  Return to clinic in 3 weeks for her last treatment and to assess for muscle aches and pains.    No orders of the defined  types were placed in this encounter.  The patient has a good understanding of the overall plan. she agrees with it. she will call with any problems that may develop before the next visit here.  Total time spent: 30 mins including face to face time and time spent for planning, charting and coordination of care  Rulon Eisenmenger, MD, MPH 08/08/2021  I, Thana Ates, am acting as scribe for Dr. Nicholas Lose.  I have reviewed the above documentation for accuracy and completeness, and I agree with the above.

## 2021-08-07 NOTE — Assessment & Plan Note (Signed)
Patient palpated a right breast lump x69yrand a left breast lump x29yrDiagnostic mammogram and USKoreahowed in the right breast, a 2.7cm mass 5cm from the nipple and 0.8cm mass 1cm from the nipple at the 12:30 position, with borderline cortical thickening in the right axilla, and in the left breast, a 4.0cm mass at the 11 o'clock position representing a hamartoma. 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  Treatment plan: 1. Neoadjuvant chemotherapy with [facility] 6 cyclescompleted 1/25/2022followed by Herceptin Perjetamaintenance for 1 year 2. Followed by breast conserving surgery if possible with sentinel lymph node study2/08/2021: Complete pathologic response, 0/4lymph nodes negative 3. Followed by adjuvant radiation therapy3/21/22-4/15/22 4.Followed by adjuvant antiestrogen therapy with letrozole started 06/28/2021 ------------------------------------------------------------------------------------------------------------------------------------------- Current treatment:Maintenance Herceptin and Perjetauntil 08/29/2021 Echocardiogram 09/06/2020: EF 60 to 65%  Toxicities: Mild intermittent diarrhea: Mild hypokalemia:onpotassium containing foods.  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

## 2021-08-08 ENCOUNTER — Other Ambulatory Visit (HOSPITAL_COMMUNITY): Payer: Self-pay

## 2021-08-08 ENCOUNTER — Inpatient Hospital Stay (HOSPITAL_BASED_OUTPATIENT_CLINIC_OR_DEPARTMENT_OTHER): Payer: Medicaid Other | Admitting: Hematology and Oncology

## 2021-08-08 ENCOUNTER — Inpatient Hospital Stay: Payer: Medicaid Other

## 2021-08-08 ENCOUNTER — Other Ambulatory Visit: Payer: Self-pay

## 2021-08-08 ENCOUNTER — Encounter: Payer: Self-pay | Admitting: Emergency Medicine

## 2021-08-08 ENCOUNTER — Inpatient Hospital Stay: Payer: Medicaid Other | Attending: Hematology and Oncology

## 2021-08-08 DIAGNOSIS — Z170 Estrogen receptor positive status [ER+]: Secondary | ICD-10-CM

## 2021-08-08 DIAGNOSIS — Z79899 Other long term (current) drug therapy: Secondary | ICD-10-CM | POA: Insufficient documentation

## 2021-08-08 DIAGNOSIS — C50211 Malignant neoplasm of upper-inner quadrant of right female breast: Secondary | ICD-10-CM | POA: Diagnosis not present

## 2021-08-08 DIAGNOSIS — E876 Hypokalemia: Secondary | ICD-10-CM | POA: Insufficient documentation

## 2021-08-08 DIAGNOSIS — Z5112 Encounter for antineoplastic immunotherapy: Secondary | ICD-10-CM | POA: Diagnosis not present

## 2021-08-08 LAB — CBC WITH DIFFERENTIAL (CANCER CENTER ONLY)
Abs Immature Granulocytes: 0.01 10*3/uL (ref 0.00–0.07)
Basophils Absolute: 0 10*3/uL (ref 0.0–0.1)
Basophils Relative: 1 %
Eosinophils Absolute: 0.1 10*3/uL (ref 0.0–0.5)
Eosinophils Relative: 2 %
HCT: 36.2 % (ref 36.0–46.0)
Hemoglobin: 12 g/dL (ref 12.0–15.0)
Immature Granulocytes: 0 %
Lymphocytes Relative: 41 %
Lymphs Abs: 2.5 10*3/uL (ref 0.7–4.0)
MCH: 28.6 pg (ref 26.0–34.0)
MCHC: 33.1 g/dL (ref 30.0–36.0)
MCV: 86.2 fL (ref 80.0–100.0)
Monocytes Absolute: 0.4 10*3/uL (ref 0.1–1.0)
Monocytes Relative: 7 %
Neutro Abs: 3 10*3/uL (ref 1.7–7.7)
Neutrophils Relative %: 49 %
Platelet Count: 195 10*3/uL (ref 150–400)
RBC: 4.2 MIL/uL (ref 3.87–5.11)
RDW: 13 % (ref 11.5–15.5)
WBC Count: 6 10*3/uL (ref 4.0–10.5)
nRBC: 0 % (ref 0.0–0.2)

## 2021-08-08 LAB — CMP (CANCER CENTER ONLY)
ALT: 14 U/L (ref 0–44)
AST: 18 U/L (ref 15–41)
Albumin: 3.8 g/dL (ref 3.5–5.0)
Alkaline Phosphatase: 70 U/L (ref 38–126)
Anion gap: 9 (ref 5–15)
BUN: 11 mg/dL (ref 6–20)
CO2: 26 mmol/L (ref 22–32)
Calcium: 9.5 mg/dL (ref 8.9–10.3)
Chloride: 106 mmol/L (ref 98–111)
Creatinine: 0.74 mg/dL (ref 0.44–1.00)
GFR, Estimated: >60 mL/min (ref >60)
Glucose, Bld: 103 mg/dL — ABNORMAL HIGH (ref 70–99)
Potassium: 3.7 mmol/L (ref 3.5–5.1)
Sodium: 141 mmol/L (ref 135–145)
Total Bilirubin: 0.4 mg/dL (ref 0.3–1.2)
Total Protein: 7.1 g/dL (ref 6.5–8.1)

## 2021-08-08 MED ORDER — GABAPENTIN 300 MG PO CAPS
ORAL_CAPSULE | Freq: Every day | ORAL | 3 refills | Status: DC
  Filled 2021-08-08: qty 90, 90d supply, fill #0

## 2021-08-08 MED ORDER — SODIUM CHLORIDE 0.9 % IV SOLN
420.0000 mg | Freq: Once | INTRAVENOUS | Status: AC
  Administered 2021-08-08: 420 mg via INTRAVENOUS
  Filled 2021-08-08: qty 14

## 2021-08-08 MED ORDER — SODIUM CHLORIDE 0.9% FLUSH
10.0000 mL | INTRAVENOUS | Status: DC | PRN
  Administered 2021-08-08: 10 mL

## 2021-08-08 MED ORDER — SODIUM CHLORIDE 0.9 % IV SOLN: Freq: Once | INTRAVENOUS | Status: AC

## 2021-08-08 MED ORDER — SODIUM CHLORIDE 0.9% FLUSH
10.0000 mL | Freq: Once | INTRAVENOUS | Status: AC
  Administered 2021-08-08: 10 mL
  Filled 2021-08-08: qty 10

## 2021-08-08 MED ORDER — HEPARIN SOD (PORK) LOCK FLUSH 100 UNIT/ML IV SOLN
500.0000 [IU] | Freq: Once | INTRAVENOUS | Status: AC | PRN
  Administered 2021-08-08: 500 [IU]

## 2021-08-08 MED ORDER — ACETAMINOPHEN 325 MG PO TABS
650.0000 mg | ORAL_TABLET | Freq: Once | ORAL | Status: AC
  Administered 2021-08-08: 650 mg via ORAL
  Filled 2021-08-08: qty 2

## 2021-08-08 MED ORDER — DIPHENHYDRAMINE HCL 25 MG PO CAPS
25.0000 mg | ORAL_CAPSULE | Freq: Once | ORAL | Status: AC
  Administered 2021-08-08: 25 mg via ORAL
  Filled 2021-08-08: qty 1

## 2021-08-08 MED ORDER — SODIUM CHLORIDE 0.9 % IV SOLN
6.0000 mg/kg | Freq: Once | INTRAVENOUS | Status: AC
  Administered 2021-08-08: 336 mg via INTRAVENOUS
  Filled 2021-08-08: qty 16

## 2021-08-08 MED ORDER — VITAMIN D 50 MCG (2000 UT) PO CAPS
1.0000 | ORAL_CAPSULE | Freq: Every day | ORAL | Status: DC
Start: 2021-08-08 — End: 2022-01-25

## 2021-08-08 NOTE — Patient Instructions (Signed)
[facility]COL[facility]  Discharge Instructions: Thank you for choosing [facility] to provide your oncology and hematology care.   If you have a lab appointment with the [facility], please go directly to the [facility] and check in at the registration area.   Wear comfortable clothing and clothing appropriate for easy access to any Portacath or PICC line.   We strive to give you quality time with your provider. You may need to reschedule your appointment if you arrive late (15 or more minutes).  Arriving late affects you and other patients whose appointments are after yours.  Also, if you miss three or more appointments without notifying the office, you may be dismissed from the clinic at the provider's discretion.      For prescription refill requests, have your pharmacy contact our office and allow 72 hours for refills to be completed.    Today you received the following chemotherapy and/or immunotherapy agents :Perjeta, Herceptin   To help prevent nausea and vomiting after your treatment, we encourage you to take your nausea medication as directed.  BELOW ARE SYMPTOMS THAT SHOULD BE REPORTED IMMEDIATELY: *FEVER GREATER THAN 100.4 F (38 C) OR HIGHER *CHILLS OR SWEATING *NAUSEA AND VOMITING THAT IS NOT CONTROLLED WITH YOUR NAUSEA MEDICATION *UNUSUAL SHORTNESS OF BREATH *UNUSUAL BRUISING OR BLEEDING *URINARY PROBLEMS (pain or burning when urinating, or frequent urination) *BOWEL PROBLEMS (unusual diarrhea, constipation, pain near the anus) TENDERNESS IN MOUTH AND THROAT WITH OR WITHOUT PRESENCE OF ULCERS (sore throat, sores in mouth, or a toothache) UNUSUAL RASH, SWELLING OR PAIN  UNUSUAL VAGINAL DISCHARGE OR ITCHING   Items with * indicate a potential emergency and should be followed up as soon as possible or go to the Emergency Department if any problems should occur.  Please show the CHEMOTHERAPY ALERT CARD or IMMUNOTHERAPY ALERT CARD at  check-in to the Emergency Department and triage nurse.  Should you have questions after your visit or need to cancel or reschedule your appointment, please contact [facility]  Dept: [phone]  and follow the prompts.  Office hours are 8:00 a.m. to 4:30 p.m. Monday - Friday. Please note that voicemails left after 4:00 p.m. may not be returned until the following business day.  We are closed weekends and major holidays. You have access to a nurse at all times for urgent questions. Please call the main number to the clinic Dept: [phone] and follow the prompts.   For any non-urgent questions, you may also contact your provider using MyChart. We now offer e-Visits for anyone 24 and older to request care online for non-urgent symptoms. For details visit [email or web address].   Also download the MyChart app! Go to the app store, search "MyChart", open the app, select [facility], and log in with your MyChart username and password.  Due to Covid, a mask is required upon entering the hospital/clinic. If you do not have a mask, one will be given to you upon arrival. For doctor visits, patients may have 1 support person aged 30 or older with them. For treatment visits, patients cannot have anyone with them due to current Covid guidelines and our immunocompromised population.

## 2021-08-08 NOTE — Research (Signed)
ACCRU-[place]-2102 - TREATMENT OF ESTABLISHED CHEMOTHERAPY-INDUCED NEUROPATHY WITH N-PALMITOYLETHANOLAMIDE, A CANNABIMIMETIC NUTRACEUTICAL: A RANDOMIZED DOUBLE-BLIND PHASE II PILOT TRIAL  08/08/21 - Week 4 Visit  Patient presented to clinic for her scheduled appointment with Dr. Lindi Adie and herceptin infusion.  Questionnaires: The patient provided completed questionnaires for week 4 of this study.  Study Compliance:  She reports taking the medication always (7 out of 7 days) in the past week.   She denies using CBD or THC.    Neuropathy Symptoms: Patient states he neuropathy symptoms are still worsening in bilateral hands and feet.    Study Drug Discontinuation: After discussion with Dr. Lindi Adie concerning neuropathy symptoms and management, the patient decided to discontinue study treatment and restart gabapentin in order to better control neuropathy symptoms.    Adverse Events:  She reports no change in her nausea.  Denies vomiting and diarrhea.  Event Grade Onset Date Resolved Date Attribution Treatment Comments  Nausea 1 07/04/21 Ongoing Unlikely None Began when patient started taking letrozole    Plan: Patient instructed to discontinue study drug.  Patient was asked to bring unused medication and questionnaires with her to her next scheduled visit.  Per protocol, the patient will enter follow up for this study and receive a phone call at 6 and 12 months from registration.  She was thanked for her time and participation in this study and encouraged to call with any questions.   Clabe Seal Clinical Research Coordinator I  08/08/21 10:40 AM

## 2021-08-10 ENCOUNTER — Telehealth: Payer: Self-pay | Admitting: Hematology and Oncology

## 2021-08-10 NOTE — Telephone Encounter (Signed)
Scheduled per 8/15 los. Called and spoke with pt confirmed 9/6 appts

## 2021-08-16 ENCOUNTER — Other Ambulatory Visit (HOSPITAL_COMMUNITY): Payer: Self-pay

## 2021-08-17 ENCOUNTER — Encounter: Payer: Self-pay | Admitting: *Deleted

## 2021-08-17 ENCOUNTER — Telehealth: Payer: Self-pay | Admitting: *Deleted

## 2021-08-17 DIAGNOSIS — G620 Drug-induced polyneuropathy: Secondary | ICD-10-CM

## 2021-08-17 DIAGNOSIS — T451X5A Adverse effect of antineoplastic and immunosuppressive drugs, initial encounter: Secondary | ICD-10-CM

## 2021-08-17 NOTE — Research (Signed)
ACCRU-[place]-2102 - TREATMENT OF ESTABLISHED CHEMOTHERAPY-INDUCED NEUROPATHY WITH N-PALMITOYLETHANOLAMIDE, A CANNABIMIMETIC NUTRACEUTICAL: A RANDOMIZED DOUBLE-BLIND PHASE II PILOT TRIAL   08/17/21 10:55am  Patient Amanda Rich came to Research department to return unused study medication and study questionnaires that had been previously provided.  Clabe Seal was out of office, return accepted by this RN.  Patient denies any needs at this time.  Patient was thanked for returning the study medication and materials.  Patient returned one unopened bottle of study medication, and returned 7 capsules in the opened medication bottle (total 37 capsules returned).  Study medication was returned to Pharmacy and drug count confirmed with Raul Del, Pharm D.  Patient also returned 4 weeks of unanswered questionnaires and corresponding return envelopes.  Also included in envelope from patient was a letter in an unsealed envelope, not trial related.  This RN left voicemail for patient to inquire if the Research team can mail this to her home, or if patient would like to pick up.  Contact information provided.  If no response, then this will be mailed to patient's home.  Doreatha Martin, RN, BSN, [facility] 08/17/2021 3:22 PM

## 2021-08-17 NOTE — Telephone Encounter (Addendum)
Today 3:40PM  Patient Amanda Rich returned call regarding personal letter provided with Research supplies in error.  This RN confirmed envelope was not sealed at time it was discovered, and contents have not been removed.  Patient confirmed it is acceptable to send to her home address, does not need urgently.  Patient thanked for the return call; will place in a secondary envelope and mail to patient's home address today.  Doreatha Martin, RN, BSN, [facility] 08/17/2021 3:49 PM  Letter placed in mail today 08/18/21 by member of the Research team. Doreatha Martin, RN, BSN, [facility] 08/18/2021 4:37 PM

## 2021-08-18 ENCOUNTER — Encounter: Payer: Self-pay | Admitting: Hematology and Oncology

## 2021-08-25 DIAGNOSIS — Z419 Encounter for procedure for purposes other than remedying health state, unspecified: Secondary | ICD-10-CM | POA: Diagnosis not present

## 2021-08-28 NOTE — Progress Notes (Signed)
Patient Care Team: Girtha Rm, NP-C as PCP - General (Family Medicine) Sueanne Margarita, MD as PCP - Cardiology (Cardiology) Rockwell Germany, RN as Oncology Nurse Navigator [name], RN as Oncology Nurse Navigator [name], MD as Consulting Physician (General Surgery) Nicholas Lose, MD as Consulting Physician (Hematology and Oncology) Gery Pray, MD as Consulting Physician (Radiation Oncology)  DIAGNOSIS:    ICD-10-CM   1. Malignant neoplasm of 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, estrogen receptor positive ([place])  C50.211    Z17.0       SUMMARY OF ONCOLOGIC HISTORY: Oncology History  Malignant neoplasm of 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, estrogen receptor positive ([place])  08/25/2020 Initial Diagnosis   Patient palpated a right breast lump x58yrand a left breast lump x230yrDiagnostic mammogram and USKoreahowed in the right breast, a 2.7cm mass 5cm from the nipple and 0.8cm mass 1cm from the nipple at the 12:30 position, with borderline cortical thickening in the right axilla, and in the left breast, a 4.0cm mass at the 11 o'clock position representing a hamartoma. 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   09/13/2020 - 01/18/2021 Chemotherapy   [facility]        02/02/2021 Surgery   Right lumpectomy ([facility]): no residual carcinoma, 4 right axillary lymph nodes negative for carcinoma.   02/21/2021 -  Chemotherapy      Patient is on Antibody Plan: BREAST TRASTUZUMAB + PERTUZUMAB Q21D     03/11/2021 - 04/11/2021 Radiation Therapy   Adjuvant radiation     CHIEF COMPLIANT: Adjuvant maintenance Herceptin and Perjeta, today is her last treatment  INTERVAL HISTORY: Amanda Rich a [age].o. with above-mentioned history of right breast cancer treated with  neoadjuvant chemotherapy followed lumpectomy and radiation and is currently on Herceptin and Perjeta maintenance. She presents to the clinic today for the final treatment of Herceptin and Perjeta.  Overall she tolerated it very well.  She was also experiencing muscle aches and pains with letrozole and she discontinued it and the muscle aches and pains went away.  She continues to suffer from intermittent neuropathy in her hands and feet.  Some days is worse than the others.  ALLERGIES:  is allergic to clarithromycin.  MEDICATIONS:  Current Outpatient Medications  Medication Sig Dispense Refill   albuterol (VENTOLIN HFA) 108 (90 Base) MCG/ACT inhaler Inhale 2 puffs into the lungs every 6 (six) hours as needed for wheezing or shortness of breath. 8.5 g 0   budesonide-formoterol (SYMBICORT) 80-4.5 MCG/ACT inhaler Inhale 2 puffs into the lungs in the morning and at bedtime. 10.2 g 12   Cholecalciferol (VITAMIN D) 50 MCG (2000 UT) CAPS Take 1 capsule (2,000 Units total) by mouth daily. 30 capsule    citalopram (CELEXA) 10 MG tablet Take 1 tablet (10 mg total) by mouth daily. 90 tablet 3   gabapentin (NEURONTIN) 300 MG capsule TAKE 1 CAPSULE BY MOUTH AT BEDTIME 90 capsule 3   ibuprofen (ADVIL) 800 MG tablet TAKE 1 TABLET BY MOUTH EVERY 8 HOURS AS NEEDED 30 tablet 0   letrozole (FEMARA) 2.5 MG tablet Take 1 tablet (2.5 mg total) by mouth daily. 90 tablet 3   nicotine (NICODERM CQ - DOSED IN MG/24 HR) 7 mg/24hr patch PLACE 1 PATCH ONTO THE SKIN DAILY. 28[place]  patch 0   No current facility-administered medications for this visit.    PHYSICAL EXAMINATION: ECOG PERFORMANCE STATUS: 1 - Symptomatic but completely ambulatory  Vitals:   08/30/21 0804  BP: 124/83  Pulse: 82  Resp: 18  Temp: 97.7 F (36.5 C)  SpO2: 99%   Filed Weights   08/30/21 0804  Weight: 119 lb 9.6 oz (54.3 kg)    LABORATORY DATA:  I have reviewed the data as listed CMP Latest Ref Rng & Units 08/08/2021 06/28/2021 05/16/2021   Glucose 70 - 99 mg/dL 103(H) 92 94  BUN 6 - 20 mg/dL 11 9 10   Creatinine 0.44 - 1.00 mg/dL 0.74 0.72 0.70  Sodium 135 - 145 mmol/L 141 142 141  Potassium 3.5 - 5.1 mmol/L 3.7 3.7 3.5  Chloride 98 - 111 mmol/L 106 108 108  CO2 22 - 32 mmol/L 26 27 28   Calcium 8.9 - 10.3 mg/dL 9.5 9.1 9.4  Total Protein 6.5 - 8.1 g/dL 7.1 6.8 6.8  Total Bilirubin 0.3 - 1.2 mg/dL 0.4 0.3 0.5  Alkaline Phos 38 - 126 U/L 70 77 72  AST 15 - 41 U/L 18 20 19   ALT 0 - 44 U/L 14 16 12     Lab Results  Component Value Date   WBC 6.0 08/08/2021   HGB 12.0 08/08/2021   HCT 36.2 08/08/2021   MCV 86.2 08/08/2021   PLT 195 08/08/2021   NEUTROABS 3.0 08/08/2021    ASSESSMENT & PLAN:  Malignant neoplasm of 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, estrogen receptor positive ([place]) Patient palpated a right breast lump x23yrand a left breast lump x2103yrDiagnostic mammogram and USKoreahowed in the right breast, a 2.7cm mass 5cm from the nipple and 0.8cm mass 1cm from the nipple at the 12:30 position, with borderline cortical thickening in the right axilla, and in the left breast, a 4.0cm mass at the 11 o'clock position representing a hamartoma. 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   Treatment plan: 1. Neoadjuvant chemotherapy with TCH Perjeta 6 cycles completed 01/18/2021 followed by Herceptin Perjeta maintenance for 1 year completed 08/30/2021 2. Followed by breast conserving surgery if possible with sentinel lymph node study 02/02/2021: Complete pathologic response, 0/4 lymph nodes negative 3. Followed by adjuvant radiation therapy 03/14/21- 04/08/21 4.  Followed by adjuvant antiestrogen therapy with letrozole started 06/28/2021 ------------------------------------------------------------------------------------------------------------------------------------------- Current  treatment: Maintenance Herceptin and Perjeta (today's last treatment) Echocardiogram 09/06/2020: EF 60 to 65%   Toxicities: Diarrhea resolved.   Muscle aches and pains: Since her muscle pains got better, we switched her to anastrozole therapy.   Neuropathy: She was on the clinical trial for neuropathy with PEM did not find that it helped her.  She went back on gabapentin.  Return to clinic in 3 months for telephone visit to assess tolerance to anastrozole therapy. She wants to go back to work because she does not have disability.  She might try to go back to work part-time.  Her neuropathy will make it challenging for her to be on her feet and use her hands as effectively as before.    No orders of the defined types were placed in this encounter.  The patient has a good understanding of the overall plan. she agrees with it. she will call with any problems that may develop before the next visit here.  Total time spent: 30 mins including face to face time and time spent for planning, charting and coordination of care  Rulon Eisenmenger, MD, MPH 08/30/2021  I, Thana Ates, am acting as scribe for Dr. Nicholas Lose.  I have reviewed the above documentation for accuracy and completeness, and I agree with the above.

## 2021-08-30 ENCOUNTER — Encounter: Payer: Self-pay | Admitting: *Deleted

## 2021-08-30 ENCOUNTER — Other Ambulatory Visit: Payer: Self-pay

## 2021-08-30 ENCOUNTER — Inpatient Hospital Stay (HOSPITAL_BASED_OUTPATIENT_CLINIC_OR_DEPARTMENT_OTHER): Payer: Medicaid Other | Admitting: Hematology and Oncology

## 2021-08-30 ENCOUNTER — Ambulatory Visit: Payer: Medicaid Other | Admitting: Hematology and Oncology

## 2021-08-30 ENCOUNTER — Ambulatory Visit: Payer: Medicaid Other

## 2021-08-30 ENCOUNTER — Other Ambulatory Visit (HOSPITAL_COMMUNITY): Payer: Self-pay

## 2021-08-30 ENCOUNTER — Inpatient Hospital Stay: Payer: Medicaid Other | Attending: Hematology and Oncology

## 2021-08-30 DIAGNOSIS — C50211 Malignant neoplasm of upper-inner quadrant of right female breast: Secondary | ICD-10-CM

## 2021-08-30 DIAGNOSIS — Z170 Estrogen receptor positive status [ER+]: Secondary | ICD-10-CM

## 2021-08-30 DIAGNOSIS — Z5112 Encounter for antineoplastic immunotherapy: Secondary | ICD-10-CM | POA: Diagnosis not present

## 2021-08-30 DIAGNOSIS — Z79899 Other long term (current) drug therapy: Secondary | ICD-10-CM | POA: Insufficient documentation

## 2021-08-30 MED ORDER — ANASTROZOLE 1 MG PO TABS
1.0000 mg | ORAL_TABLET | Freq: Every day | ORAL | 3 refills | Status: DC
  Filled 2021-08-30: qty 90, 90d supply, fill #0
  Filled 2022-01-14: qty 90, 90d supply, fill #1
  Filled 2022-05-17: qty 90, 90d supply, fill #2
  Filled 2022-08-07: qty 90, 90d supply, fill #3

## 2021-08-30 MED ORDER — SODIUM CHLORIDE 0.9% FLUSH
10.0000 mL | INTRAVENOUS | Status: DC | PRN
  Administered 2021-08-30: 10 mL

## 2021-08-30 MED ORDER — ACETAMINOPHEN 325 MG PO TABS
650.0000 mg | ORAL_TABLET | Freq: Once | ORAL | Status: AC
  Administered 2021-08-30: 650 mg via ORAL
  Filled 2021-08-30: qty 2

## 2021-08-30 MED ORDER — SODIUM CHLORIDE 0.9 % IV SOLN: Freq: Once | INTRAVENOUS | Status: AC

## 2021-08-30 MED ORDER — DIPHENHYDRAMINE HCL 25 MG PO CAPS
25.0000 mg | ORAL_CAPSULE | Freq: Once | ORAL | Status: AC
  Administered 2021-08-30: 25 mg via ORAL
  Filled 2021-08-30: qty 1

## 2021-08-30 MED ORDER — TRASTUZUMAB-DKST CHEMO 150 MG IV SOLR
6.0000 mg/kg | Freq: Once | INTRAVENOUS | Status: AC
  Administered 2021-08-30: 336 mg via INTRAVENOUS
  Filled 2021-08-30: qty 16

## 2021-08-30 MED ORDER — SODIUM CHLORIDE 0.9 % IV SOLN
420.0000 mg | Freq: Once | INTRAVENOUS | Status: AC
  Administered 2021-08-30: 420 mg via INTRAVENOUS
  Filled 2021-08-30: qty 14

## 2021-08-30 MED ORDER — HEPARIN SOD (PORK) LOCK FLUSH 100 UNIT/ML IV SOLN
500.0000 [IU] | Freq: Once | INTRAVENOUS | Status: AC | PRN
  Administered 2021-08-30: 500 [IU]

## 2021-08-30 NOTE — Assessment & Plan Note (Signed)
Patient palpated a right breast lump x96yrand a left breast lump x21yrDiagnostic mammogram and USKoreahowed in the right breast, a 2.7cm mass 5cm from the nipple and 0.8cm mass 1cm from the nipple at the 12:30 position, with borderline cortical thickening in the right axilla, and in the left breast, a 4.0cm mass at the 11 o'clock position representing a hamartoma. 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  Treatment plan: 1. Neoadjuvant chemotherapy with TCMonticelloerjeta 6 cyclescompleted 1/25/2022followed by Herceptin Perjetamaintenance for 1 year completed 08/30/2021 2. Followed by breast conserving surgery if possible with sentinel lymph node study2/08/2021: Complete pathologic response, 0/4lymph nodes negative 3. Followed by adjuvant radiation therapy3/21/22-4/15/22 4.Followed by adjuvant antiestrogen therapywith letrozole started 06/28/2021 ------------------------------------------------------------------------------------------------------------------------------------------- Current treatment:Maintenance Herceptin and Perjeta(today's last treatment) Echocardiogram 09/06/2020: EF 60 to 65%  Toxicities: Mild intermittent diarrhea: Mild hypokalemia:onpotassium containing foods.  Muscle aches and pains: I instructed her to stop letrozole at this time.  I will see her back in 3 weeks and reassess.  If her symptoms improve then we can switch her to anastrozole.  Neuropathy:She was on the clinical trial for neuropathy with PEM did not find that it helped her.  She went back on gabapentin.  Return to clinic in 3 months for survivorship care plan visit

## 2021-08-30 NOTE — Progress Notes (Signed)
Per MD request RN successfully faxed [employer] live strong application to [phone]. RN also provided patient with copy of medical clearance form.

## 2021-08-30 NOTE — Patient Instructions (Signed)
Amanda [facility] ONCOLOGY   Discharge Instructions: Thank you for choosing [facility] to provide your oncology and hematology care.   If you have a lab appointment with the [facility], please go directly to the [facility] and check in at the registration area.   Wear comfortable clothing and clothing appropriate for easy access to any Portacath or PICC line.   We strive to give you quality time with your provider. You may need to reschedule your appointment if you arrive late (15 or more minutes).  Arriving late affects you and other patients whose appointments are after yours.  Also, if you miss three or more appointments without notifying the office, you may be dismissed from the clinic at the provider's discretion.      For prescription refill requests, have your pharmacy contact our office and allow 72 hours for refills to be completed.    Today you received the following chemotherapy and/or immunotherapy agents: trastuzumab and pertuzumab.      To help prevent nausea and vomiting after your treatment, we encourage you to take your nausea medication as directed.  BELOW ARE SYMPTOMS THAT SHOULD BE REPORTED IMMEDIATELY: *FEVER GREATER THAN 100.4 F (38 C) OR HIGHER *CHILLS OR SWEATING *NAUSEA AND VOMITING THAT IS NOT CONTROLLED WITH YOUR NAUSEA MEDICATION *UNUSUAL SHORTNESS OF BREATH *UNUSUAL BRUISING OR BLEEDING *URINARY PROBLEMS (pain or burning when urinating, or frequent urination) *BOWEL PROBLEMS (unusual diarrhea, constipation, pain near the anus) TENDERNESS IN MOUTH AND THROAT WITH OR WITHOUT PRESENCE OF ULCERS (sore throat, sores in mouth, or a toothache) UNUSUAL RASH, SWELLING OR PAIN  UNUSUAL VAGINAL DISCHARGE OR ITCHING   Items with * indicate a potential emergency and should be followed up as soon as possible or go to the Emergency Department if any problems should occur.  Please show the CHEMOTHERAPY ALERT CARD or IMMUNOTHERAPY ALERT  CARD at check-in to the Emergency Department and triage nurse.  Should you have questions after your visit or need to cancel or reschedule your appointment, please contact [facility]  Dept: [phone]  and follow the prompts.  Office hours are 8:00 a.m. to 4:30 p.m. Monday - Friday. Please note that voicemails left after 4:00 p.m. may not be returned until the following business day.  We are closed weekends and major holidays. You have access to a nurse at all times for urgent questions. Please call the main number to the clinic Dept: [phone] and follow the prompts.   For any non-urgent questions, you may also contact your provider using MyChart. We now offer e-Visits for anyone 50 and older to request care online for non-urgent symptoms. For details visit [email or web address].   Also download the MyChart app! Go to the app store, search "MyChart", open the app, select [facility], and log in with your MyChart username and password.  Due to Covid, a mask is required upon entering the hospital/clinic. If you do not have a mask, one will be given to you upon arrival. For doctor visits, patients may have 1 support person aged 52 or older with them. For treatment visits, patients cannot have anyone with them due to current Covid guidelines and our immunocompromised population.

## 2021-08-31 ENCOUNTER — Telehealth: Payer: Self-pay | Admitting: Hematology and Oncology

## 2021-08-31 NOTE — Telephone Encounter (Signed)
Scheduled per 9/6 los. Called and spoke with pt confirmed 12/13 appt

## 2021-09-01 ENCOUNTER — Encounter: Payer: Self-pay | Admitting: Licensed Clinical Social Worker

## 2021-09-01 NOTE — Progress Notes (Signed)
[facility] CSW Progress Note  Patient brought in [employer] application for help submitting for possible assistance. CSW reviewed application with patient. Need financial statements for application- patient will bring in tomorrow.    Christeen Douglas , LCSW

## 2021-09-05 ENCOUNTER — Encounter: Payer: Self-pay | Admitting: Licensed Clinical Social Worker

## 2021-09-05 NOTE — Progress Notes (Signed)
[facility] CSW Progress Note  Clinical Education officer, museum submitted completed [employer] application for assistance consideration. [employer] will determine eligibility and notify patient.    Christeen Douglas , LCSW

## 2021-09-06 ENCOUNTER — Telehealth: Payer: Self-pay | Admitting: *Deleted

## 2021-09-06 NOTE — Telephone Encounter (Signed)
Received call from pt requesting port a cath be removed considering she has completed tx.  Per MD okay for pt to proceed with port removal.  RN will message Dr. Josetta Huddle team to schedule procedure.  Pt educated and verbalized understanding.

## 2021-09-08 ENCOUNTER — Encounter: Payer: Self-pay | Admitting: Licensed Clinical Social Worker

## 2021-09-13 ENCOUNTER — Encounter: Payer: Self-pay | Admitting: Physical Therapy

## 2021-09-13 ENCOUNTER — Other Ambulatory Visit: Payer: Self-pay

## 2021-09-13 ENCOUNTER — Ambulatory Visit: Payer: Medicaid Other | Attending: Surgery | Admitting: Physical Therapy

## 2021-09-13 DIAGNOSIS — C50211 Malignant neoplasm of upper-inner quadrant of right female breast: Secondary | ICD-10-CM | POA: Diagnosis not present

## 2021-09-13 DIAGNOSIS — M25611 Stiffness of right shoulder, not elsewhere classified: Secondary | ICD-10-CM | POA: Insufficient documentation

## 2021-09-13 DIAGNOSIS — Z170 Estrogen receptor positive status [ER+]: Secondary | ICD-10-CM | POA: Diagnosis not present

## 2021-09-13 DIAGNOSIS — R293 Abnormal posture: Secondary | ICD-10-CM | POA: Insufficient documentation

## 2021-09-13 DIAGNOSIS — M25511 Pain in right shoulder: Secondary | ICD-10-CM | POA: Diagnosis not present

## 2021-09-13 NOTE — Therapy (Signed)
[place], [place], [address] Phone: [phone]   Fax:  [phone]  Physical Therapy Evaluation  Patient Details  Name: Amanda Rich MRN: [id number] Date of Birth: Mar 25, 1966 Referring Provider (PT): Dr. [name]   Encounter Date: 09/13/2021   PT End of Session - 09/13/21 1353     Visit Number 1    Number of Visits 9    Date for PT Re-Evaluation 10/11/21    PT Start Time 1306    PT Stop Time 1345    PT Time Calculation (min) 39 min    Activity Tolerance Patient tolerated treatment well    Behavior During Therapy [facility] for tasks assessed/performed             Past Medical History:  Diagnosis Date   Anemia    Cancer ([place])    breast cancer   Depression    Family history of breast cancer    Family history of liver cancer    History of radiation therapy 03/10/21-04/11/21   Right breast- Dr. Gery Pray   Pericarditis    Smoker    Umbilical hernia     Past Surgical History:  Procedure Laterality Date   ABLATION ON ENDOMETRIOSIS     BREAST LUMPECTOMY WITH RADIOACTIVE SEED AND SENTINEL LYMPH NODE BIOPSY Right 02/02/2021   Procedure: RIGHT BREAST LUMPECTOMY WITH RADIOACTIVE SEED AND RIGHT SENTINEL LYMPH NODE [place];  Surgeon: [name], MD;  Location: [facility];  Service: General;  Laterality: Right;   GANGLION CYST EXCISION  2007   L wrist; [facility], [place]   IR IMAGING GUIDED PORT INSERTION  09/10/2020   TUBAL LIGATION     [facility]   UMBILICAL HERNIA REPAIR  2005   [facility]    There were no vitals filed for this visit.    Subjective Assessment - 09/13/21 1306     Subjective I can't raise my R arm all the way and it hurts everyday.    Pertinent History Patient was diagnosed on 08/17/2020 with right grade III invasive ductal carcinoma breast cancer. She underwent chemotherapy from 09/13/2020 - 01/18/2021. Then she had a right lumpectomy and sentinel node biopsy (4  negative nodes) on 02/02/2021.  It is ER positive, PR negative, andHER2 positive with a Ki67 of 30%. She has a history of a heart attack in 12/2018.    Patient Stated Goals Get my arm back to normal    Currently in Pain? Yes    Pain Score 4     Pain Location Shoulder    Pain Orientation Right    Pain Descriptors / Indicators Sore    Pain Type Acute pain    Pain Onset More than a month ago    Pain Frequency Constant    Aggravating Factors  raising shoulder    Pain Relieving Factors heating pad gives minimal relief    Effect of Pain on Daily Activities difficult to sleep, hard to get dressed                [facility] PT Assessment - 09/13/21 0001       Assessment   Medical Diagnosis s/p right lumpectomy and SLNB    Referring Provider (PT) Dr. [name] [place]    Onset Date/Surgical Date 02/02/21    Hand Dominance Right    Prior Therapy Baselines      Precautions   Precautions Other (comment)    Precaution Comments at risk of lymphedema  Restrictions   Weight Bearing Restrictions No      Balance Screen   Has the patient fallen in the past 6 months No    Has the patient had a decrease in activity level because of a fear of falling?  No    Is the patient reluctant to leave their home because of a fear of falling?  No      Home Environment   Living Environment Private residence    Living Arrangements Alone    Available Help at Discharge Family      Prior Function   Level of [place] Unemployed    Leisure walks on treadmill and uses gazelle, uses 3 and 5 lb weights and uses stair stepper does exercise every other day for 15 min      Cognition   Overall Cognitive Status Within Functional Limits for tasks assessed      Observation/Other Assessments   Observations --      Posture/Postural Control   Posture/Postural Control Postural limitations    Postural Limitations Rounded Shoulders;Forward head      AROM   Right Shoulder Extension 67  Degrees    Right Shoulder Flexion 160 Degrees    Right Shoulder ABduction 179 Degrees    Right Shoulder Internal Rotation 0 Degrees    Right Shoulder External Rotation 105 Degrees      Strength   Overall Strength Comments Right UE 5/5 except ER 4-/5                        Objective measurements completed on examination: See above findings.       [facility] Adult PT Treatment/Exercise - 09/13/21 0001       Manual Therapy   Manual Therapy Soft tissue mobilization;Muscle Energy Technique    Soft tissue mobilization to insertion of rotator cuff tendons using cross friction technique, to lats, serratus and teres with increased muscle tightness and "rope like" texture palpable    Muscle Energy Technique contract/relax in to R shoulder IR                          PT Long Term Goals - 09/13/21 1400       PT LONG TERM GOAL #1   Title Pt will demonstrate 50 degrees of R shoulder abduction when RUE is in 90 degrees of abduction.    Baseline 0    Time 4    Period Weeks    Status New    Target Date 10/11/21      PT LONG TERM GOAL #2   Title Pt will report she is able to sleep through the night without being bothered by pain from her R shoulder    Time 4    Period Weeks    Status New    Target Date 10/11/21      PT LONG TERM GOAL #3   Title Pt will report she is able to get dressed without pain and difficulty to allow her to return to PLOF    Time 4    Period Weeks    Status New    Target Date 10/11/21      PT LONG TERM GOAL #4   Title Pt will be independent in a home exercise program for continued strengthening and stretching.    Time 4    Period Weeks    Status New    Target Date 10/11/21  Plan - 09/13/21 1355     Clinical Impression Statement Pt reports to PT with increased R shoulder pain and decreased ROM in direction of internal rotation. Pt has no right shoulder IR and has pain when attempting. At first she  was unable to demonstrate correct glenohumeral rhythm when simutaneously abduction and internally rotating shoulder. She has increased pain and shoulder hiking. She was able to demonstrate correct glenohumeral rhythm if she abducted her arm first and then attempted IR. She may be experiencing impingement and is now demonstrating compensatory movement patterns. She has trouble dressing herself and difficulty sleeping at night secondary to pain. Pt would benefit from skilled PT services to decrease R shoulder pain and improve R shoulder IR.    Examination-Activity Limitations Dressing;Sleep    Stability/Clinical Decision Making Stable/Uncomplicated    Clinical Decision Making Low    Rehab Potential Good    PT Frequency 2x / week    PT Duration 4 weeks    PT Treatment/Interventions ADLs/Self Care Home Management;Therapeutic exercise;Patient/family education;Iontophoresis 49m/ml Dexamethasone;Manual techniques;Passive range of motion;Taping;Joint Manipulations    PT Next Visit Plan R shoulder ROM especially in to IR, cross friction to rotator cuff insertion, supine scap series, contract relax, STM in s/l to posterior/lateral shoulder includings lats, serratus and teres    Consulted and Agree with Plan of Care Patient             Patient will benefit from skilled therapeutic intervention in order to improve the following deficits and impairments:  Pain, Postural dysfunction, Impaired UE functional use, Impaired flexibility, Increased fascial restricitons, Decreased strength, Decreased range of motion  Visit Diagnosis: Acute pain of right shoulder  Stiffness of right shoulder, not elsewhere classified  Abnormal posture  Malignant neoplasm of 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, estrogen receptor positive ([place]     Problem List Patient Active Problem List   Diagnosis Date Noted   Family history of breast cancer    Family history of liver cancer    Malignant neoplasm of  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, estrogen receptor positive ([place] 08/25/2020   Abnormal chest CT 01/30/2019   History of pericarditis 01/30/2019   Pericarditis 01/22/2019   Chest pain 12/26/2018   History of endometrial ablation 01/21/2018   Family history of colon cancer 09/24/2017   Poor diet 01/24/2017   Weight loss, abnormal 01/24/2017   Smoker 09/22/2016   Mixed hyperlipidemia 08/07/2016   Irregular intermenstrual bleeding 08/07/2016   Depression 08/07/2016   Fat necrosis of peritoneum ([place] 08/07/2016   Pelvic congestion syndrome 08/07/2016    BManus Gunning PT 09/13/2021, 2:02 PM  [facility] [place] [address]Phone: [phone]  Fax:  [phone] Name: Amanda MONTASMRN: [id number] of Birth: 8May 13, 1967 BManus Gunning PT 09/13/21 2:02 PM

## 2021-09-14 ENCOUNTER — Encounter: Payer: Self-pay | Admitting: *Deleted

## 2021-09-19 ENCOUNTER — Telehealth: Payer: Self-pay | Admitting: Adult Health

## 2021-09-19 NOTE — Telephone Encounter (Signed)
LMOM for patient to return our call so we can discuss adjusting upcoming appts to survivorship care plan visit.  Amanda Bihari, NP

## 2021-09-20 NOTE — Progress Notes (Signed)
@Patient  ID: Amanda Rich, female    DOB: 08-27-1966, 55 y.o.   MRN: [id number]  Chief Complaint  Patient presents with   Consult    Referred by PCP for possible emphysema. This was shown on a CT scan back in 2020.     Referring provider: Girtha Rm, NP-C  HPI:   55 year old woman whom we are seeing in consultation for evaluation of emphysema and dyspnea exertion.  PCP note reviewed.  Most recent oncology note reviewed.  Patient reports history of dyspnea on exertion.  Relatively mild.  Worse on inclines or stairs.  Has seasonal allergies.  May be a bit worse with change of seasons.  Uses albuterol with improvement.  No time of day where things are better or worse.  No positional changes that make things better or worse.  No other alleviating or exacerbating factors.  Also concerned because told she has emphysema.  Seen on CT scans in the past.  She would like more information about this.  Never been diagnosed with COPD.  No pulmonary function test performed to her knowledge.  Reviewed most recent chest x-ray 01/14/21 though my interpretation reveals clear lungs bilaterally.  CTA PE protocol 10/2020 reviewed and interpreted as clear lungs with mild emphysematous changes with predilection for the apices/upper lobes.  PMH: Tobacco abuse, asthma, breast cancer in remission on hormonal therapy Family history: Mother with diabetes, hypertension Surgical history: Lumpectomy with radiation for breast cancer Social history: Current smoker, 25-pack-year, lives in [place] / Pulmonary Flowsheets:   ACT:  No flowsheet data found.  MMRC: No flowsheet data found.  Epworth:  No flowsheet data found.  Tests:   FENO:  No results found for: NITRICOXIDE  PFT:   WALK:  No flowsheet data found.  Imaging: Personally reviewed and as per EMR and discussion in this note  Lab Results: Personally reviewed CBC    Component Value Date/Time   WBC 6.0 08/08/2021  0918   WBC 6.4 10/30/2020 1200   RBC 4.20 08/08/2021 0918   HGB 12.0 08/08/2021 0918   HCT 36.2 08/08/2021 0918   PLT 195 08/08/2021 0918   MCV 86.2 08/08/2021 0918   MCH 28.6 08/08/2021 0918   MCHC 33.1 08/08/2021 0918   RDW 13.0 08/08/2021 0918   LYMPHSABS 2.5 08/08/2021 0918   MONOABS 0.4 08/08/2021 0918   EOSABS 0.1 08/08/2021 0918   BASOSABS 0.0 08/08/2021 0918    BMET    Component Value Date/Time   NA 141 08/08/2021 0918   K 3.7 08/08/2021 0918   CL 106 08/08/2021 0918   CO2 26 08/08/2021 0918   GLUCOSE 103 (H) 08/08/2021 0918   BUN 11 08/08/2021 0918   CREATININE 0.74 08/08/2021 0918   CREATININE 0.68 09/24/2017 0827   CALCIUM 9.5 08/08/2021 0918   GFRNONAA >60 08/08/2021 0918   GFRNONAA >89 09/22/2016 1501   GFRAA >60 09/20/2020 1415   GFRAA >89 09/22/2016 1501    BNP    Component Value Date/Time   BNP 44.2 12/26/2018 0646    ProBNP No results found for: PROBNP  Specialty Problems   None   Allergies  Allergen Reactions   Clarithromycin Swelling    Biaxin    Immunization History  Administered Date(s) Administered   Influenza,inj,Quad PF,6+ Mos 10/04/2020   PFIZER Comirnaty(Gray Top)Covid-19 Tri-Sucrose Vaccine 06/28/2021   PFIZER(Purple Top)SARS-COV-2 Vaccination 03/25/2020, 04/19/2020   Tdap 09/22/2016    Past Medical History:  Diagnosis Date   Anemia    Cancer (  [place])    breast cancer   Depression    Family history of breast cancer    Family history of liver cancer    History of radiation therapy 03/10/21-04/11/21   Right breast- Dr. Gery Pray   Pericarditis    Smoker    Umbilical hernia     Tobacco History: Social History   Tobacco Use  Smoking Status Some Days   Packs/day: 1.00   Years: 25.00   Pack years: 25.00   Types: Cigarettes  Smokeless Tobacco Never  Tobacco Comments   smoking cigarettes again and not e-cigs   Ready to quit: Not Answered Counseling given: Not Answered Tobacco comments: smoking cigarettes  again and not e-cigs    Outpatient Encounter Medications as of 06/15/2021  Medication Sig   albuterol (VENTOLIN HFA) 108 (90 Base) MCG/ACT inhaler Inhale 2 puffs into the lungs every 6 (six) hours as needed for wheezing or shortness of breath.   budesonide-formoterol (SYMBICORT) 80-4.5 MCG/ACT inhaler Inhale 2 puffs into the lungs in the morning and at bedtime.   citalopram (CELEXA) 10 MG tablet Take 1 tablet (10 mg total) by mouth daily.   ibuprofen (ADVIL) 800 MG tablet TAKE 1 TABLET BY MOUTH EVERY 8 HOURS AS NEEDED   nicotine (NICODERM CQ - DOSED IN MG/24 HR) 7 mg/24hr patch PLACE 1 PATCH ONTO THE SKIN DAILY.   [DISCONTINUED] gabapentin (NEURONTIN) 300 MG capsule TAKE 1 CAPSULE BY MOUTH AT BEDTIME (Patient not taking: Reported on 07/11/2021)   [DISCONTINUED] Melatonin 1 MG CHEW Chew by mouth. (Patient not taking: Reported on 07/11/2021)   [DISCONTINUED] Multiple Vitamin (MULTIVITAMIN WITH MINERALS) TABS Take 1 tablet by mouth daily.   [DISCONTINUED] prochlorperazine (COMPAZINE) 10 MG tablet Take 1 tablet (10 mg total) by mouth every 6 (six) hours as needed (Nausea or vomiting).   No facility-administered encounter medications on file as of 06/15/2021.     Review of Systems  Review of Systems  No chest pain with exertion.  No orthopnea or PND.  Comprehensive review of systems otherwise negative. Physical Exam  BP 116/70   Pulse 90   Temp 98.2 F (36.8 C) (Oral)   Ht 5\' 5"  (1.651 m)   Wt 120 lb 12.8 oz (54.8 kg)   LMP 10/15/2017 (Approximate)   SpO2 100% Comment: on RA  BMI 20.10 kg/m   Wt Readings from Last 5 Encounters:  08/30/21 119 lb 9.6 oz (54.3 kg)  08/08/21 120 lb 3.2 oz (54.5 kg)  07/18/21 121 lb 0.5 oz (54.9 kg)  06/28/21 120 lb 4.8 oz (54.6 kg)  06/15/21 120 lb 12.8 oz (54.8 kg)    BMI Readings from Last 5 Encounters:  08/30/21 19.90 kg/m  08/08/21 20.00 kg/m  07/18/21 20.14 kg/m  06/28/21 20.02 kg/m  06/15/21 20.10 kg/m     Physical Exam General:  Well-appearing, no acute distress Eyes: EOMI, icterus Neck: Supple, no JVP Pulmonary: Clear to auscultation, normal work of breathing Cardiovascular regular in rhythm, no murmur Abdomen: Single bowel sounds present MSK: No synovitis, no joint effusion Neuro: Normal gait, no weakness Psych: Normal mood, full affect   Assessment & Plan:   Dyspnea on exertion: Suspect reemergence of asthma.  She has significant smoking history and emphysematous changes in the upper lobes of her lungs, so COPD is possible.  Will obtain PFTs for further evaluation.  Asthma: Present as a child.  Worsening dyspnea, some cough.  Albuterol improved symptoms some.  Prescribed Symbicort 2 puffs twice a day.  Albuterol she can continue  use on a as needed basis.   Return in about 3 months (around 09/15/2021).   Lanier Clam, MD

## 2021-09-22 ENCOUNTER — Other Ambulatory Visit (HOSPITAL_COMMUNITY): Payer: Self-pay

## 2021-09-22 ENCOUNTER — Ambulatory Visit (INDEPENDENT_AMBULATORY_CARE_PROVIDER_SITE_OTHER): Payer: Medicaid Other | Admitting: Pulmonary Disease

## 2021-09-22 ENCOUNTER — Encounter: Payer: Self-pay | Admitting: Pulmonary Disease

## 2021-09-22 ENCOUNTER — Other Ambulatory Visit: Payer: Self-pay

## 2021-09-22 DIAGNOSIS — F17200 Nicotine dependence, unspecified, uncomplicated: Secondary | ICD-10-CM

## 2021-09-22 MED ORDER — SPIRIVA RESPIMAT 2.5 MCG/ACT IN AERS
2.0000 | INHALATION_SPRAY | Freq: Every day | RESPIRATORY_TRACT | 6 refills | Status: DC
  Filled 2021-09-22: qty 4, 30d supply, fill #0
  Filled 2021-12-20 – 2022-01-02 (×2): qty 4, 30d supply, fill #1
  Filled 2022-03-05: qty 4, 30d supply, fill #2

## 2021-09-22 MED ORDER — NICOTINE 7 MG/24HR TD PT24
MEDICATED_PATCH | TRANSDERMAL | 11 refills | Status: DC
  Filled 2021-09-22: qty 28, 28d supply, fill #0
  Filled 2021-12-20: qty 28, 28d supply, fill #1

## 2021-09-22 NOTE — Patient Instructions (Signed)
Continue symbicort two times a day and the albuterol as needed  I added spiriva once a day - I hope this helps the breathing, wheezing  I refilled the nicotine patch  Return to clinic in 6 months or sooner

## 2021-09-22 NOTE — Progress Notes (Signed)
@Patient  ID: Amanda Rich, female    DOB: [birth year], 55 y.o.   MRN: [id number]  Chief Complaint  Patient presents with   Follow-up    Referring provider: Girtha Rm, NP-C  HPI:   55 year old woman whom we are seeing in follow up  for evaluation of emphysema and dyspnea exertion.  Symptoms felt related to asthma.  Given concern for asthma.  High-dose Symbicort prescribed at last visit.  She reports adherence. Has been helpful with her breathing.  Back using the treadmill.  Not as much as she had.  But has more stamina and is actually starting to exercise again.  She feels fatigued overall.  Blames ongoing effects of previous radiation.  She still has some chest tightness, wheeze in the morning.  Using albuterol about once a day.  In interim since last visit, PFTs obtained.  Reviewed PFT 07/07/2021 my interpretation reveals prebronchodilator fixed obstruction but resolves after bronchodilators, significant bronchodilator spots in both FEV1 and FVC.  Reveal hyperinflation, gas trapping.  DLCO moderately reduced consistent with her emphysema on imaging.  HPI at initial visit: Patient reports history of dyspnea on exertion.  Relatively mild.  Worse on inclines or stairs.  Has seasonal allergies.  May be a bit worse with change of seasons.  Uses albuterol with improvement.  No time of day where things are better or worse.  No positional changes that make things better or worse.  No other alleviating or exacerbating factors.  Also concerned because told she has emphysema.  Seen on CT scans in the past.  She would like more information about this.  Never been diagnosed with COPD.  No pulmonary function test performed to her knowledge.  Reviewed most recent chest x-ray 01/14/21 though my interpretation reveals clear lungs bilaterally.  CTA PE protocol 10/2020 reviewed and interpreted as clear lungs with mild emphysematous changes with predilection for the apices/upper lobes.  PMH: Tobacco  abuse, asthma, breast cancer in remission on hormonal therapy Family history: Mother with diabetes, hypertension Surgical history: Lumpectomy with radiation for breast cancer Social history: Current smoker, 25-pack-year, lives in [place] / Pulmonary Flowsheets:   ACT:  No flowsheet data found.  MMRC: No flowsheet data found.  Epworth:  No flowsheet data found.  Tests:   FENO:  No results found for: NITRICOXIDE  PFT: 07/07/21 - reviewed my interpretation reveals prebronchodilator fixed obstruction but resolves after bronchodilators, significant bronchodilator spots in both FEV1 and FVC.  Reveal hyperinflation, gas trapping.  DLCO moderately reduced consistent with her emphysema on imaging.  WALK:  No flowsheet data found.  Imaging: Personally reviewed and as per EMR and discussion in this note  Lab Results: Personally reviewed CBC    Component Value Date/Time   WBC 6.0 08/08/2021 0918   WBC 6.4 10/30/2020 1200   RBC 4.20 08/08/2021 0918   HGB 12.0 08/08/2021 0918   HCT 36.2 08/08/2021 0918   PLT 195 08/08/2021 0918   MCV 86.2 08/08/2021 0918   MCH 28.6 08/08/2021 0918   MCHC 33.1 08/08/2021 0918   RDW 13.0 08/08/2021 0918   LYMPHSABS 2.5 08/08/2021 0918   MONOABS 0.4 08/08/2021 0918   EOSABS 0.1 08/08/2021 0918   BASOSABS 0.0 08/08/2021 0918    BMET    Component Value Date/Time   NA 141 08/08/2021 0918   K 3.7 08/08/2021 0918   CL 106 08/08/2021 0918   CO2 26 08/08/2021 0918   GLUCOSE 103 (H) 08/08/2021 0918   BUN 11 08/08/2021  0092   CREATININE 0.74 08/08/2021 0918   CREATININE 0.68 09/24/2017 0827   CALCIUM 9.5 08/08/2021 0918   GFRNONAA >60 08/08/2021 0918   GFRNONAA >89 09/22/2016 1501   GFRAA >60 09/20/2020 1415   GFRAA >89 09/22/2016 1501    BNP    Component Value Date/Time   BNP 44.2 12/26/2018 0646    ProBNP No results found for: PROBNP  Specialty Problems   None  Allergies  Allergen Reactions    Clarithromycin Swelling    Biaxin    Immunization History  Administered Date(s) Administered   Influenza,inj,Quad PF,6+ Mos 10/25/2018, 10/04/2020   PFIZER Comirnaty(Gray Top)Covid-19 Tri-Sucrose Vaccine 06/28/2021   PFIZER(Purple Top)SARS-COV-2 Vaccination 03/25/2020, 04/19/2020   Tdap 09/22/2016    Past Medical History:  Diagnosis Date   Anemia    Cancer ([place])    breast cancer   Depression    Family history of breast cancer    Family history of liver cancer    History of radiation therapy 03/10/21-04/11/21   Right breast- Dr. Gery Pray   Pericarditis    Smoker    Umbilical hernia     Tobacco History: Social History   Tobacco Use  Smoking Status Some Days   Packs/day: 1.00   Years: 25.00   Pack years: 25.00   Types: Cigarettes  Smokeless Tobacco Never  Tobacco Comments   Smoking .5 ppd- 09/22/21   Ready to quit: Not Answered Counseling given: Not Answered Tobacco comments: Smoking .5 ppd- 09/22/21    Outpatient Encounter Medications as of 09/22/2021  Medication Sig   albuterol (VENTOLIN HFA) 108 (90 Base) MCG/ACT inhaler Inhale 2 puffs into the lungs every 6 (six) hours as needed for wheezing or shortness of breath.   anastrozole (ARIMIDEX) 1 MG tablet Take 1 tablet (1 mg total) by mouth daily.   budesonide-formoterol (SYMBICORT) 80-4.5 MCG/ACT inhaler Inhale 2 puffs into the lungs in the morning and at bedtime.   CALCIUM PO Take by mouth.   Cholecalciferol (VITAMIN D) 50 MCG (2000 UT) CAPS Take 1 capsule (2,000 Units total) by mouth daily.   citalopram (CELEXA) 10 MG tablet Take 1 tablet (10 mg total) by mouth daily.   gabapentin (NEURONTIN) 300 MG capsule TAKE 1 CAPSULE BY MOUTH AT BEDTIME   ibuprofen (ADVIL) 800 MG tablet TAKE 1 TABLET BY MOUTH EVERY 8 HOURS AS NEEDED   Tiotropium Bromide Monohydrate (SPIRIVA RESPIMAT) 2.5 MCG/ACT AERS Inhale 2 puffs into the lungs daily.   [DISCONTINUED] nicotine (NICODERM CQ - DOSED IN MG/24 HR) 7 mg/24hr patch PLACE 1  PATCH ONTO THE SKIN DAILY.   nicotine (NICODERM CQ - DOSED IN MG/24 HR) 7 mg/24hr patch PLACE 1 PATCH ONTO THE SKIN DAILY.   [DISCONTINUED] prochlorperazine (COMPAZINE) 10 MG tablet Take 1 tablet (10 mg total) by mouth every 6 (six) hours as needed (Nausea or vomiting).   No facility-administered encounter medications on file as of 09/22/2021.     Review of Systems  Review of Systems  N/a Physical Exam  BP 106/70 (BP Location: Left Arm, Patient Position: Sitting, Cuff Size: Normal)   Pulse 69   Temp 98.3 F (36.8 C) (Oral)   Ht 5\' 5"  (1.651 m)   Wt 121 lb 12.8 oz (55.2 kg)   LMP 10/15/2017 (Approximate)   SpO2 100%   BMI 20.27 kg/m   Wt Readings from Last 5 Encounters:  09/22/21 121 lb 12.8 oz (55.2 kg)  08/30/21 119 lb 9.6 oz (54.3 kg)  08/08/21 120 lb 3.2 oz (54.5 kg)  07/18/21 121 lb 0.5 oz (54.9 kg)  06/28/21 120 lb 4.8 oz (54.6 kg)    BMI Readings from Last 5 Encounters:  09/22/21 20.27 kg/m  08/30/21 19.90 kg/m  08/08/21 20.00 kg/m  07/18/21 20.14 kg/m  06/28/21 20.02 kg/m     Physical Exam General: Well-appearing, no acute distress Eyes: EOMI, icterus Neck: Supple, no JVP Pulmonary: Clear to auscultation, normal work of breathing Cardiovascular regular in rhythm, no murmur Abdomen: Single bowel sounds present MSK: No synovitis, no joint effusion Neuro: Normal gait, no weakness Psych: Normal mood, full affect   Assessment & Plan:   Dyspnea on exertion: Suspect reemergence of asthma.  She has significant smoking history and emphysematous changes in the upper lobes of her lungs, so COPD is possible.  Symbicort is helped.  Still with residual symptoms.  Given fixed obstruction prior to bronchodilators and emphysema on imaging, prescribed Symbicort Respimat for additional symptomatic relief.  Asthma: Present as a child.  Worsening dyspnea, some cough.  Albuterol improved symptoms some.  Prescribed Symbicort 2 puffs twice a day.  Has been helpful.  Doing  more.  More active, more breath.  Added Spiriva as above.   Return in about 6 months (around 03/22/2022).   Lanier Clam, MD

## 2021-09-24 DIAGNOSIS — Z419 Encounter for procedure for purposes other than remedying health state, unspecified: Secondary | ICD-10-CM | POA: Diagnosis not present

## 2021-09-28 ENCOUNTER — Ambulatory Visit: Payer: Medicaid Other

## 2021-09-29 ENCOUNTER — Other Ambulatory Visit (HOSPITAL_COMMUNITY): Payer: Self-pay

## 2021-10-04 ENCOUNTER — Other Ambulatory Visit: Payer: Self-pay

## 2021-10-04 ENCOUNTER — Ambulatory Visit: Payer: Medicaid Other | Attending: Surgery

## 2021-10-04 DIAGNOSIS — M25511 Pain in right shoulder: Secondary | ICD-10-CM | POA: Diagnosis not present

## 2021-10-04 DIAGNOSIS — C50211 Malignant neoplasm of upper-inner quadrant of right female breast: Secondary | ICD-10-CM | POA: Insufficient documentation

## 2021-10-04 DIAGNOSIS — Z483 Aftercare following surgery for neoplasm: Secondary | ICD-10-CM | POA: Insufficient documentation

## 2021-10-04 DIAGNOSIS — M25611 Stiffness of right shoulder, not elsewhere classified: Secondary | ICD-10-CM | POA: Diagnosis not present

## 2021-10-04 DIAGNOSIS — Z170 Estrogen receptor positive status [ER+]: Secondary | ICD-10-CM | POA: Diagnosis not present

## 2021-10-04 DIAGNOSIS — R293 Abnormal posture: Secondary | ICD-10-CM | POA: Insufficient documentation

## 2021-10-04 NOTE — Therapy (Signed)
[facility] @ [place], [place], [address] Phone: [phone]   Fax:  [phone]  Physical Therapy Treatment  Patient Details  Name: Amanda Rich MRN: [id number] Date of Birth: 07-12-1966 Referring Provider (PT): Dr. [name]   Encounter Date: 10/04/2021   PT End of Session - 10/04/21 1732     Visit Number 2    Number of Visits 9    Date for PT Re-Evaluation 10/11/21    Authorization Type 09/14/21- 11/13/2021    Authorization - Visit Number 12    PT Start Time 6803    PT Stop Time 1700    PT Time Calculation (min) 56 min    Activity Tolerance Patient limited by pain    Behavior During Therapy [facility] for tasks assessed/performed             Past Medical History:  Diagnosis Date   Anemia    Cancer ([place])    breast cancer   Depression    Family history of breast cancer    Family history of liver cancer    History of radiation therapy 03/10/21-04/11/21   Right breast- Dr. Gery Pray   Pericarditis    Smoker    Umbilical hernia     Past Surgical History:  Procedure Laterality Date   ABLATION ON ENDOMETRIOSIS     BREAST LUMPECTOMY WITH RADIOACTIVE SEED AND SENTINEL LYMPH NODE BIOPSY Right 02/02/2021   Procedure: RIGHT BREAST LUMPECTOMY WITH RADIOACTIVE SEED AND RIGHT SENTINEL LYMPH NODE [place];  Surgeon: [name], MD;  Location: [place] [facility];  Service: General;  Laterality: Right;   GANGLION CYST EXCISION  2007   L wrist; [facility], [place]   IR IMAGING GUIDED PORT INSERTION  09/10/2020   TUBAL LIGATION     [facility]   UMBILICAL HERNIA REPAIR  2005   [place]    There were no vitals filed for this visit.   Subjective Assessment - 10/04/21 1608     Subjective My right shoulder hurts all the time  on top of the shoulder.  I get burning at the posterior arm ever since the neulasta shot. 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 I feel like it made it worse.  6/10 presently in right shoulder    Pertinent History Patient was diagnosed on 08/17/2020 with right grade III invasive ductal carcinoma breast cancer. She underwent chemotherapy from 09/13/2020 - 01/18/2021. Then she had a right lumpectomy and sentinel node biopsy (4 negative nodes) on 02/02/2021.  It is ER positive, PR negative, andHER2 positive with a Ki67 of 30%. She has a history of a heart attack in 12/2018.    Patient Stated Goals Get my arm back to normal    Currently in Pain? Yes    Pain Score 6     Pain Location Shoulder    Pain Orientation Right    Pain Descriptors / Indicators Sore    Pain Type Acute pain    Pain Onset More than a month ago    Pain Frequency Intermittent                               [facility] Adult PT Treatment/Exercise - 10/04/21 0001       Shoulder Exercises: Supine   Other Supine Exercises Alternating isometrics 3 x 30 sec IR  and ER      Iontophoresis   Type of Iontophoresis Dexamethasone    Location right shoulder over supraspinatus insertion    Dose 78m/ml    Time 4 hour      Manual Therapy   Manual therapy comments Assessed PROM secondary to pt so limited today with AROM. Has full passive abd with ERP, hikes shoulder with abduction, positive empty can and impingement.  Pain with resisted ER    Soft tissue mobilization Soft tissue mobilization to right UT, supraspinatus, infraspinaus, Teres Minor and major, rhomboids to decrease muscle tension.  Cross friction massage to supraspinatus tendon                     PT Education - 10/04/21 1730     Education Details Pt was educated in the role of iontophoresis and how it works.  Advised to remove patch in 4 hours or if she has any complaints of burning, itching or pain.  Advised to remove with soap and water.  Also discussed mentioning her shoulder pain to her family MD when she sees her on Thursday and perhaps she can get an XRay.   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    Baseline 0    Time 4    Period Weeks    Status New    Target Date 10/11/21      PT LONG TERM GOAL #2   Title Pt will report she is able to sleep through the night without being bothered by pain from her R shoulder    Time 4    Period Weeks    Status New    Target Date 10/11/21      PT LONG TERM GOAL #3   Title Pt will report she is able to get dressed without pain and difficulty to allow her to return to PLOF    Time 4    Period Weeks    Status New    Target Date 10/11/21      PT LONG TERM GOAL #4   Title Pt will be independent in a home exercise program for continued strengthening and stretching.    Time 4    Period Weeks    Status New    Target Date 10/11/21                   Plan - 10/04/21 1734     Clinical Impression Statement Pt came to therapy with significant limitation in right shoulder AROM in all directions compared to initial evaluation.  She was reassessed and demonstrates positive empty can and impingement test.  PROM for abduction was full.  She was observed with scapular compensation with attempts at abduction.  Initiated soft tissue mobilization to the right scapular area and interscapular area and UT.  Pt with significant increased tissue tension throughout, and twitch responses noted in rhomboid region. Cross friction massage was performed to supraspinatus.  She sees her family practice MD and will speak with her about this on Thursday.  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 We initiated Ionto today and pt was  educated to remove in 4 hours or sooner if having any adverse effects like burning, pain, itching    Examination-Activity Limitations Dressing;Sleep    Stability/Clinical Decision Making Stable/Uncomplicated    Rehab Potential Good    PT Frequency 2x / week    PT Duration 4 weeks    PT Treatment/Interventions ADLs/Self Care Home Management;Therapeutic exercise;Patient/family education;Iontophoresis 42m/ml Dexamethasone;Manual techniques;Passive range of motion;Taping;Joint Manipulations    PT Next Visit Plan STM in SL to posterior/lateral shoulder, shoulder stabs as tolerated, Cross friction massage, Continue Ionto as tolerated,progress to strength as pain improves    Consulted and Agree with Plan of Care Patient             Patient will benefit from skilled therapeutic intervention in order to improve the following deficits and impairments:  Pain, Postural dysfunction, Impaired UE functional use, Impaired flexibility, Increased fascial restricitons, Decreased strength, Decreased range of motion  Visit Diagnosis: Acute pain of right shoulder  Stiffness of right shoulder, not elsewhere classified  Abnormal posture  Malignant neoplasm of 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, estrogen receptor positive ([facility]  Aftercare following surgery for neoplasm     Problem List Patient Active Problem List   Diagnosis Date Noted   Family history of breast cancer    Family history of liver cancer    Malignant neoplasm of 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, estrogen receptor positive ([place] 08/25/2020   Abnormal chest CT 01/30/2019   History of pericarditis 01/30/2019   Pericarditis 01/22/2019   Chest pain 12/26/2018   History of endometrial ablation 01/21/2018   Family history of colon cancer 09/24/2017   Poor diet 01/24/2017   Weight loss, abnormal 01/24/2017   Smoker 09/22/2016   Mixed hyperlipidemia 08/07/2016   Irregular intermenstrual bleeding 08/07/2016    Depression 08/07/2016   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

## 2021-10-05 NOTE — Progress Notes (Signed)
Subjective:    Patient ID: Amanda Rich, female    DOB: [birth year], 55 y.o.   MRN: [id number]  HPI Chief Complaint  Patient presents with   Annual Exam   She is here for a complete physical exam.  Other providers: Dr. Larey Days- Pulmonologist Dr. Nicholas Lose - Oncologist Dr. Colbert Ewing: [facility] surgeon - removed breast surgery Dr. Amanda Pea radiologist @ cancer center Dr. Hoyt Koch  She is a breast cancer survivor. Still has a port-a cath but due to be removed soon. Recently completed chemo.   Bilateral knee pain and intermittent swelling. Pain is worsening. Requesting referral to an orthopedist.   States she needs a pap smear. Hx of endometrial ablation and pelvic congestion syndrome.   Social history: Lives with boyfriend. Currently not working due to shoulder pain and recent cancer treatment.  Diet: eating very "good" Excerise: on treadmill    Health maintenance:   Mammogram: Cannot have until next year due to port a cath in place Colonoscopy: 03/29/2018 Last Gynecological Exam: 2 years ago.  Last Menstrual cycle: Does not have due to menopause Last Dental Exam: 2 months ago Last Eye Exam: 2 months ago  Wears seatbelt always, smoke detectors in home and functioning, does not text while driving and feels safe in home environment.   Reviewed allergies, medications, past medical, surgical, family, and social history.    Review of Systems Review of Systems Constitutional: -fever, -chills, -sweats, -unexpected weight change,-fatigue ENT: -runny nose, -ear pain, -sore throat Cardiology:  -chest pain, -palpitations, -edema Respiratory: -cough, -shortness of breath, -wheezing Gastroenterology: -abdominal pain, -nausea, -vomiting, -diarrhea, -constipation  Hematology: -bleeding or bruising problems Musculoskeletal: +arthralgias, -myalgias, -joint swelling, -back pain Ophthalmology: -vision changes Urology: -dysuria, -difficulty urinating, -hematuria,  -urinary frequency, -urgency Neurology: -headache, -weakness, -tingling, -numbness       Objective:   Physical Exam BP 138/80 (BP Location: Left Arm, Patient Position: Sitting)   Pulse 68   Ht 5\' 5"  (1.651 m)   Wt 120 lb 3.2 oz (54.5 kg)   LMP 10/15/2017 (Approximate)   SpO2 99%   BMI 20.00 kg/m   General Appearance:    Alert, cooperative, no distress, appears stated age  Head:    Normocephalic, without obvious abnormality, atraumatic  Eyes:    PERRL, conjunctiva/corneas clear, EOM's intact  Ears:    Normal TM's and external ear canals  Nose:   Mask on   Throat:   Mask on   Neck:   Supple, no lymphadenopathy;  thyroid:  no   enlargement/tenderness/nodules; no JVD  Back:    Spine nontender, no curvature, ROM normal, no CVA     tenderness  Lungs:     Clear to auscultation bilaterally without wheezes, rales or     ronchi; respirations unlabored  Chest Wall:    No tenderness or deformity   Heart:    Regular rate and rhythm, S1 and S2 normal, no murmur, rub   or gallop  Breast Exam:    Declines. Recent breast cancer treatment   Abdomen:     Soft, non-tender, nondistended, normoactive bowel sounds,    no masses, no hepatosplenomegaly  Genitalia:    Normal external genitalia without lesions.  BUS and vagina normal. No abnormal vaginal discharge.  Pap attempted but unable to fully visualize cervix. Chaperone present.      Extremities:   No clubbing, cyanosis or edema  Pulses:   2+ and symmetric all extremities  Skin:   Skin color, texture, turgor normal, no rashes  or lesions  Lymph nodes:   Cervical, supraclavicular, and axillary nodes normal  Neurologic:   CNII-XII intact, normal strength, sensation and gait          Psych:   Normal mood, affect, hygiene and grooming.          Assessment & Plan:  Routine general medical examination at a health care facility - Plan: TSH, T4, free, Lipid panel -preventive healthcare reviewed. Closely followed by oncology. She is in good spirits  today. Immunizations discussed. Recommend healthy diet and increasing physical activity as tolerated.   Mixed hyperlipidemia - Plan: Lipid panel -last lipid panel in 2020 and in goal range. Follow up pending lipids today   Pulmonary emphysema, unspecified emphysema type ([place]) -managed by pulmonologist   Acute pain of both knees - Plan: Ambulatory referral to Orthopedic Surgery  Need for hepatitis C screening test - Plan: Hepatitis C antibody -done per screening guidelines   Screening for cervical cancer - Plan: Ambulatory referral to Obstetrics / Gynecology  History of endometrial ablation - Plan: Ambulatory referral to Obstetrics / Gynecology  Depression, unspecified depression type -doing better now that chemo completed.   Currently attempting to quit smoking  History of breast cancer - Plan: Ambulatory referral to Obstetrics / Gynecology -followed closely by oncologist

## 2021-10-05 NOTE — Patient Instructions (Addendum)
You will hear from [place] to schedule a visit to discuss your knee pain.   You will hear from the OB/GYN office to schedule for pap smear and any other female concerns.   Preventive Care 46-53 Years Old, Female Preventive care refers to lifestyle choices and visits with your health care provider that can promote health and wellness. This includes: A yearly physical exam. This is also called an annual wellness visit. Regular dental and eye exams. Immunizations. Screening for certain conditions. Healthy lifestyle choices, such as: Eating a healthy diet. Getting regular exercise. Not using drugs or products that contain nicotine and tobacco. Limiting alcohol use. What can I expect for my preventive care visit? Physical exam Your health care provider will check your: Height and weight. These may be used to calculate your BMI (body mass index). BMI is a measurement that tells if you are at a healthy weight. Heart rate and blood pressure. Body temperature. Skin for abnormal spots. Counseling Your health care provider may ask you questions about your: Past medical problems. Family's medical history. Alcohol, tobacco, and drug use. Emotional well-being. Home life and relationship well-being. Sexual activity. Diet, exercise, and sleep habits. Work and work Statistician. Access to firearms. Method of birth control. Menstrual cycle. Pregnancy history. What immunizations do I need? Vaccines are usually given at various ages, according to a schedule. Your health care provider will recommend vaccines for you based on your age, medical history, and lifestyle or other factors, such as travel or where you work. What tests do I need? Blood tests Lipid and cholesterol levels. These may be checked every 5 years, or more often if you are over 2 years old. Hepatitis C test. Hepatitis B test. Screening Lung cancer screening. You may have this screening every year starting at age 53 if you  have a 30-pack-year history of smoking and currently smoke or have quit within the past 15 years. Colorectal cancer screening. All adults should have this screening starting at age 53 and continuing until age 35. Your health care provider may recommend screening at age 91 if you are at increased risk. You will have tests every 1-10 years, depending on your results and the type of screening test. Diabetes screening. This is done by checking your blood sugar (glucose) after you have not eaten for a while (fasting). You may have this done every 1-3 years. Mammogram. This may be done every 1-2 years. Talk with your health care provider about when you should start having regular mammograms. This may depend on whether you have a family history of breast cancer. BRCA-related cancer screening. This may be done if you have a family history of breast, ovarian, tubal, or peritoneal cancers. Pelvic exam and Pap test. This may be done every 3 years starting at age 53. Starting at age 53, this may be done every 5 years if you have a Pap test in combination with an HPV test. Other tests STD (sexually transmitted disease) testing, if you are at risk. Bone density scan. This is done to screen for osteoporosis. You may have this scan if you are at high risk for osteoporosis. Talk with your health care provider about your test results, treatment options, and if necessary, the need for more tests. Follow these instructions at home: Eating and drinking  Eat a diet that includes fresh fruits and vegetables, whole grains, lean protein, and low-fat dairy products. Take vitamin and mineral supplements as recommended by your health care provider. Do not drink alcohol if: Your  health care provider tells you not to drink. You are pregnant, may be pregnant, or are planning to become pregnant. If you drink alcohol: Limit how much you have to 0-1 drink a day. Be aware of how much alcohol is in your drink. In the [place]  one drink equals one 12 oz bottle of beer (355 mL), one 5 oz glass of wine (148 mL), or one 1 oz glass of hard liquor (44 mL). Lifestyle Take daily care of your teeth and gums. Brush your teeth every morning and night with fluoride toothpaste. Floss one time each day. Stay active. Exercise for at least 30 minutes 5 or more days each week. Do not use any products that contain nicotine or tobacco, such as cigarettes, e-cigarettes, and chewing tobacco. If you need help quitting, ask your health care provider. Do not use drugs. If you are sexually active, practice safe sex. Use a condom or other form of protection to prevent STIs (sexually transmitted infections). If you do not wish to become pregnant, use a form of birth control. If you plan to become pregnant, see your health care provider for a prepregnancy visit. If told by your health care provider, take low-dose aspirin daily starting at age 53. Find healthy ways to cope with stress, such as: Meditation, yoga, or listening to music. Journaling. Talking to a trusted person. Spending time with friends and family. Safety Always wear your seat belt while driving or riding in a vehicle. Do not drive: If you have been drinking alcohol. Do not ride with someone who has been drinking. When you are tired or distracted. While texting. Wear a helmet and other protective equipment during sports activities. If you have firearms in your house, make sure you follow all gun safety procedures. What's next? Visit your health care provider once a year for an annual wellness visit. Ask your health care provider how often you should have your eyes and teeth checked. Stay up to date on all vaccines. This information is not intended to replace advice given to you by your health care provider. Make sure you discuss any questions you have with your health care provider. Document Revised: 02/18/2021 Document Reviewed: 08/22/2018 Elsevier Patient Education   2022 [employer].

## 2021-10-06 ENCOUNTER — Other Ambulatory Visit: Payer: Self-pay

## 2021-10-06 ENCOUNTER — Encounter: Payer: Self-pay | Admitting: Family Medicine

## 2021-10-06 ENCOUNTER — Ambulatory Visit: Payer: Medicaid Other

## 2021-10-06 ENCOUNTER — Ambulatory Visit (INDEPENDENT_AMBULATORY_CARE_PROVIDER_SITE_OTHER): Payer: Medicaid Other | Admitting: Family Medicine

## 2021-10-06 VITALS — BP 138/80 | HR 68 | Ht 65.0 in | Wt 120.2 lb

## 2021-10-06 DIAGNOSIS — R293 Abnormal posture: Secondary | ICD-10-CM

## 2021-10-06 DIAGNOSIS — Z0000 Encounter for general adult medical examination without abnormal findings: Secondary | ICD-10-CM | POA: Diagnosis not present

## 2021-10-06 DIAGNOSIS — Z1159 Encounter for screening for other viral diseases: Secondary | ICD-10-CM

## 2021-10-06 DIAGNOSIS — J439 Emphysema, unspecified: Secondary | ICD-10-CM | POA: Diagnosis not present

## 2021-10-06 DIAGNOSIS — M25562 Pain in left knee: Secondary | ICD-10-CM

## 2021-10-06 DIAGNOSIS — M25561 Pain in right knee: Secondary | ICD-10-CM | POA: Diagnosis not present

## 2021-10-06 DIAGNOSIS — F32A Depression, unspecified: Secondary | ICD-10-CM | POA: Diagnosis not present

## 2021-10-06 DIAGNOSIS — E782 Mixed hyperlipidemia: Secondary | ICD-10-CM | POA: Diagnosis not present

## 2021-10-06 DIAGNOSIS — Z124 Encounter for screening for malignant neoplasm of cervix: Secondary | ICD-10-CM

## 2021-10-06 DIAGNOSIS — Z483 Aftercare following surgery for neoplasm: Secondary | ICD-10-CM

## 2021-10-06 DIAGNOSIS — M25611 Stiffness of right shoulder, not elsewhere classified: Secondary | ICD-10-CM | POA: Diagnosis not present

## 2021-10-06 DIAGNOSIS — Z853 Personal history of malignant neoplasm of breast: Secondary | ICD-10-CM | POA: Diagnosis not present

## 2021-10-06 DIAGNOSIS — M25511 Pain in right shoulder: Secondary | ICD-10-CM

## 2021-10-06 DIAGNOSIS — Z170 Estrogen receptor positive status [ER+]: Secondary | ICD-10-CM | POA: Diagnosis not present

## 2021-10-06 DIAGNOSIS — C50211 Malignant neoplasm of upper-inner quadrant of right female breast: Secondary | ICD-10-CM | POA: Diagnosis not present

## 2021-10-06 DIAGNOSIS — Z9889 Other specified postprocedural states: Secondary | ICD-10-CM | POA: Diagnosis not present

## 2021-10-06 DIAGNOSIS — Z720 Tobacco use: Secondary | ICD-10-CM

## 2021-10-06 NOTE — Therapy (Signed)
[facility] @ [place], [place], [address] Phone: [phone]   Fax:  [phone]  Physical Therapy Treatment  Patient Details  Name: Amanda Rich MRN: [id number] Date of Birth: July 21, 1966 Referring Provider (PT): Dr. [name]   Encounter Date: 10/06/2021   PT End of Session - 10/06/21 1552     Visit Number 3    Number of Visits 9    Date for PT Re-Evaluation 10/11/21    Authorization Type 09/14/21- 11/13/2021    Authorization - Visit Number 12    PT Start Time 1502    PT Stop Time 9983    PT Time Calculation (min) 47 min    Activity Tolerance Patient tolerated treatment well    Behavior During Therapy [facility] for tasks assessed/performed             Past Medical History:  Diagnosis Date   Anemia    Cancer ([place])    breast cancer   Depression    Family history of breast cancer    Family history of liver cancer    History of radiation therapy 03/10/21-04/11/21   Right breast- Dr. Gery Pray   Pericarditis    Smoker    Umbilical hernia     Past Surgical History:  Procedure Laterality Date   ABLATION ON ENDOMETRIOSIS     BREAST LUMPECTOMY WITH RADIOACTIVE SEED AND SENTINEL LYMPH NODE BIOPSY Right 02/02/2021   Procedure: RIGHT BREAST LUMPECTOMY WITH RADIOACTIVE SEED AND RIGHT SENTINEL LYMPH NODE [place];  Surgeon: [name], MD;  Location: [facility];  Service: General;  Laterality: Right;   GANGLION CYST EXCISION  2007   L wrist; [facility], [place]   IR IMAGING GUIDED PORT INSERTION  09/10/2020   TUBAL LIGATION     [facility]   UMBILICAL HERNIA REPAIR  2005   [place]    There were no vitals filed for this visit.   Subjective Assessment - 10/06/21 1505     Subjective The patch stayed on well and it felt much better yesterday.  I could reach into the cabinets yesterday without pain.  No advrse effects from the patch and pt slept better the day she had the patch on    Pertinent  History Patient was diagnosed on 08/17/2020 with right grade III invasive ductal carcinoma breast cancer. She underwent chemotherapy from 09/13/2020 - 01/18/2021. Then she had a right lumpectomy and sentinel node biopsy (4 negative nodes) on 02/02/2021.  It is ER positive, PR negative, andHER2 positive with a Ki67 of 30%. She has a history of a heart attack in 12/2018.    Patient Stated Goals Get my arm back to normal    Currently in Pain? Yes    Pain Score 5     Pain Location Shoulder    Pain Orientation Right    Pain Descriptors / Indicators Sore                               [facility] Adult PT Treatment/Exercise - 10/06/21 0001       Shoulder Exercises: Supine   Other Supine Exercises Alternating isometrics 3 x 30 sec IR and ER    Other Supine Exercises standing pendulums bilaterally 4 D x 10      Shoulder Exercises: Seated   Other Seated Exercises scapula d2 ext with PT resistance 2 x 10      Iontophoresis  Type of Iontophoresis Dexamethasone    Location right shoulder over supraspinatus insertion    Dose 46m/ml    Time 4 hour      Manual Therapy   Joint Mobilization Right GH mobs gr 1/2 post/inferior/anterior    Soft tissue mobilization Soft tissue mobilization to right UT, supraspinatus, infraspinaus, Teres Minor and major, rhomboids to decrease muscle tension.  Cross friction massage to supraspinatus tendon                          PT Long Term Goals - 09/13/21 1400       PT LONG TERM GOAL #1   Title Pt will demonstrate 50 degrees of R shoulder abduction when RUE is in 90 degrees of abduction.    Baseline 0    Time 4    Period Weeks    Status New    Target Date 10/11/21      PT LONG TERM GOAL #2   Title Pt will report she is able to sleep through the night without being bothered by pain from her R shoulder    Time 4    Period Weeks    Status New    Target Date 10/11/21      PT LONG TERM GOAL #3   Title Pt will report she is able to  get dressed without pain and difficulty to allow her to return to PLOF    Time 4    Period Weeks    Status New    Target Date 10/11/21      PT LONG TERM GOAL #4   Title Pt will be independent in a home exercise program for continued strengthening and stretching.    Time 4    Period Weeks    Status New    Target Date 10/11/21                   Plan - 10/06/21 1553     Clinical Impression Statement Pt had an excellent response to Ionto #1 and got benefit that day and the next until pain returned today.  Continued soft tissue mobilization and added grade 1/2 GH joint mobilizations for pain.  Instructed pt in D2 ext scapular strength with PT resistance and its importance for decreasing  impingement secondary to compensation at UT. Continued tenderness at supraspinatus insertion and proximal biceps.    Examination-Activity Limitations Dressing;Sleep    Stability/Clinical Decision Making Stable/Uncomplicated    Rehab Potential Good    PT Frequency 2x / week    PT Duration 4 weeks    PT Treatment/Interventions ADLs/Self Care Home Management;Therapeutic exercise;Patient/family education;Iontophoresis 477mml Dexamethasone;Manual techniques;Passive range of motion;Taping;Joint Manipulations    PT Next Visit Plan STM in SL to posterior/lateral shoulder, shoulder stabs as tolerated, Cross friction massage, Continue Ionto as tolerated,progress to strength as pain improves    Consulted and Agree with Plan of Care Patient             Patient will benefit from skilled therapeutic intervention in order to improve the following deficits and impairments:  Pain, Postural dysfunction, Impaired UE functional use, Impaired flexibility, Increased fascial restricitons, Decreased strength, Decreased range of motion  Visit Diagnosis: Acute pain of right shoulder  Stiffness of right shoulder, not elsewhere classified  Abnormal posture  Malignant neoplasm of upper-inner quadrant of right breast  in female, [facility] for [facility]     Problem List Patient Active Problem  List   Diagnosis Date Noted   Family history of breast cancer    Family history of liver cancer    Malignant neoplasm of 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, estrogen receptor positive ([place]) 08/25/2020   Abnormal chest CT 01/30/2019   History of pericarditis 01/30/2019   Pericarditis 01/22/2019   Chest pain 12/26/2018   History of endometrial ablation 01/21/2018   Family history of colon cancer 09/24/2017   Poor diet 01/24/2017   Weight loss, abnormal 01/24/2017   Smoker 09/22/2016   Mixed hyperlipidemia 08/07/2016   Irregular intermenstrual bleeding 08/07/2016   Depression 08/07/2016   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

## 2021-10-07 LAB — T4, FREE: Free T4: 1.16 ng/dL (ref 0.82–1.77)

## 2021-10-07 LAB — LIPID PANEL
Chol/HDL Ratio: 2.9 ratio (ref 0.0–4.4)
Cholesterol, Total: 162 mg/dL (ref 100–199)
HDL: 56 mg/dL (ref >39)
LDL Chol Calc (NIH): 89 mg/dL (ref 0–99)
Triglycerides: 92 mg/dL (ref 0–149)
VLDL Cholesterol Cal: 17 mg/dL (ref 5–40)

## 2021-10-07 LAB — TSH: TSH: 1.74 u[IU]/mL (ref 0.450–4.500)

## 2021-10-07 LAB — HEPATITIS C ANTIBODY: Hep C Virus Ab: <0.1 s/co ratio (ref 0.0–0.9)

## 2021-10-11 ENCOUNTER — Ambulatory Visit: Payer: Medicaid Other

## 2021-10-11 ENCOUNTER — Other Ambulatory Visit: Payer: Self-pay

## 2021-10-11 DIAGNOSIS — R293 Abnormal posture: Secondary | ICD-10-CM

## 2021-10-11 DIAGNOSIS — M25511 Pain in right shoulder: Secondary | ICD-10-CM

## 2021-10-11 DIAGNOSIS — M25611 Stiffness of right shoulder, not elsewhere classified: Secondary | ICD-10-CM | POA: Diagnosis not present

## 2021-10-11 DIAGNOSIS — Z170 Estrogen receptor positive status [ER+]: Secondary | ICD-10-CM | POA: Diagnosis not present

## 2021-10-11 DIAGNOSIS — C50211 Malignant neoplasm of upper-inner quadrant of right female breast: Secondary | ICD-10-CM | POA: Diagnosis not present

## 2021-10-11 DIAGNOSIS — Z483 Aftercare following surgery for neoplasm: Secondary | ICD-10-CM | POA: Diagnosis not present

## 2021-10-11 NOTE — Therapy (Signed)
[facility] @ [place], [place], [address] Phone: [phone]   Fax:  [phone]  Physical Therapy Treatment  Patient Details  Name: Amanda Rich MRN: [id number] Date of Birth: 29-Jun-1966 Referring Provider (PT): Dr. [name]   Encounter Date: 10/11/2021   PT End of Session - 10/11/21 1701     Visit Number 4    Number of Visits 12    Date for PT Re-Evaluation 11/13/21    Authorization Type 09/14/21- 11/13/2021    Authorization - Visit Number 12    PT Start Time 1602    PT Stop Time 4818    PT Time Calculation (min) 45 min    Activity Tolerance Patient tolerated treatment well    Behavior During Therapy [facility] for tasks assessed/performed             Past Medical History:  Diagnosis Date   Anemia    Cancer ([place])    breast cancer   Depression    Family history of breast cancer    Family history of liver cancer    History of radiation therapy 03/10/21-04/11/21   Right breast- Dr. Gery Pray   Pericarditis    Smoker    Umbilical hernia     Past Surgical History:  Procedure Laterality Date   ABLATION ON ENDOMETRIOSIS     BREAST LUMPECTOMY WITH RADIOACTIVE SEED AND SENTINEL LYMPH NODE BIOPSY Right 02/02/2021   Procedure: RIGHT BREAST LUMPECTOMY WITH RADIOACTIVE SEED AND RIGHT SENTINEL LYMPH NODE [place];  Surgeon: [name], MD;  Location: [facility];  Service: General;  Laterality: Right;   GANGLION CYST EXCISION  2007   L wrist; [facility], [place]   IR IMAGING GUIDED PORT INSERTION  09/10/2020   TUBAL LIGATION     [facility]   UMBILICAL HERNIA REPAIR  2005   [place]    There were no vitals filed for this visit.   Subjective Assessment - 10/11/21 1602     Subjective I got an appt at [facility] on Friday am to check my shoulder and my knees. The second patch gave me a lot of relief.  The arm pain is not as bad but is still there at the top and my shoulder still rides up. My UT  doesn't feel as tight, but inside the shoulder really hurts.    Pertinent History Patient was diagnosed on 08/17/2020 with right grade III invasive ductal carcinoma breast cancer. She underwent chemotherapy from 09/13/2020 - 01/18/2021. Then she had a right lumpectomy and sentinel node biopsy (4 negative nodes) on 02/02/2021.  It is ER positive, PR negative, andHER2 positive with a Ki67 of 30%. She has a history of a heart attack in 12/2018.    Patient Stated Goals Get my arm back to normal    Currently in Pain? Yes    Pain Score 6     Pain Location Shoulder    Pain Orientation Right    Pain Onset More than a month ago    Pain Frequency Intermittent                               [facility] Adult PT Treatment/Exercise - 10/11/21 0001       Shoulder Exercises: Supine   Other Supine Exercises Alternating isometrics 3 x 30 sec IR and ER    Other Supine Exercises supine ABC's x 2      Shoulder  Exercises: Seated   Other Seated Exercises scapula d2 ext with PT resistance 2 x 10      Shoulder Exercises: Standing   Retraction Strengthening;Both;10 reps    Theraband Level (Shoulder Retraction) Level 1 (Yellow)      Iontophoresis   Type of Iontophoresis Dexamethasone    Location right shoulder over supraspinatus insertion    Dose 74m/ml    Time 4 hour      Manual Therapy   Joint Mobilization Right GH mobs gr 1/2 post/inferior/anterior    Soft tissue mobilization Soft tissue mobilization to right UT, supraspinatus, infraspinaus, Teres Minor and major, rhomboids to decrease muscle tension.  Cross friction massage to supraspinatus tendon    Passive ROM PROM right shoulder flexion, scaption, abd, ER                          PT Long Term Goals - 09/13/21 1400       PT LONG TERM GOAL #1   Title Pt will demonstrate 50 degrees of R shoulder abduction when RUE is in 90 degrees of abduction.    Baseline 0    Time 4    Period Weeks    Status New    Target Date  10/11/21      PT LONG TERM GOAL #2   Title Pt will report she is able to sleep through the night without being bothered by pain from her R shoulder    Time 4    Period Weeks    Status New    Target Date 10/11/21      PT LONG TERM GOAL #3   Title Pt will report she is able to get dressed without pain and difficulty to allow her to return to PLOF    Time 4    Period Weeks    Status New    Target Date 10/11/21      PT LONG TERM GOAL #4   Title Pt will be independent in a home exercise program for continued strengthening and stretching.    Time 4    Period Weeks    Status New    Target Date 10/11/21                   Plan - 10/11/21 1705     Clinical Impression Statement Pt continues to get good relief from Ionto patch but pain usually returns but not as bad the next day. she was able to tolerate exercises in clinic today that she could not do 2 visits ago.  PROM was also improved today.  She continues to exhibit scapular compensation when performing abduction activities. she has a scheduled orthopedic visit on Friday.    Examination-Activity Limitations Dressing;Sleep    Stability/Clinical Decision Making Stable/Uncomplicated    Rehab Potential Good    PT Frequency 2x / week    PT Duration 6 weeks    PT Treatment/Interventions ADLs/Self Care Home Management;Therapeutic exercise;Patient/family education;Iontophoresis 429mml Dexamethasone;Manual techniques;Passive range of motion;Taping;Joint Manipulations    PT Next Visit Plan give pics of ABC, retraction with TB,STM in SL to posterior/lateral shoulder, shoulder stabs as tolerated, Cross friction massage, Continue Ionto as tolerated,progress to strength as pain improves    PT Home Exercise Plan pendulums supine ABCS    Consulted and Agree with Plan of Care Patient             Patient will benefit from skilled therapeutic intervention in order to improve the following  deficits and impairments:  Pain, Postural  dysfunction, Impaired UE functional use, Impaired flexibility, Increased fascial restricitons, Decreased strength, Decreased range of motion  Visit Diagnosis: Acute pain of right shoulder  Stiffness of right shoulder, not elsewhere classified  Abnormal posture  Malignant neoplasm of 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, estrogen receptor positive [facility])  Aftercare following surgery for neoplasm     Problem List Patient Active Problem List   Diagnosis Date Noted   Family history of breast cancer    Family history of liver cancer    Malignant neoplasm of 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, estrogen receptor positive ([place]) 08/25/2020   Abnormal chest CT 01/30/2019   History of pericarditis 01/30/2019   Pericarditis 01/22/2019   Chest pain 12/26/2018   History of endometrial ablation 01/21/2018   Family history of colon cancer 09/24/2017   Poor diet 01/24/2017   Weight loss, abnormal 01/24/2017   Smoker 09/22/2016   Mixed hyperlipidemia 08/07/2016   Irregular intermenstrual bleeding 08/07/2016   Depression 08/07/2016   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

## 2021-10-13 ENCOUNTER — Ambulatory Visit: Payer: Medicaid Other

## 2021-10-13 ENCOUNTER — Other Ambulatory Visit: Payer: Self-pay

## 2021-10-13 DIAGNOSIS — Z170 Estrogen receptor positive status [ER+]: Secondary | ICD-10-CM | POA: Diagnosis not present

## 2021-10-13 DIAGNOSIS — C50211 Malignant neoplasm of upper-inner quadrant of right female breast: Secondary | ICD-10-CM | POA: Diagnosis not present

## 2021-10-13 DIAGNOSIS — R293 Abnormal posture: Secondary | ICD-10-CM | POA: Diagnosis not present

## 2021-10-13 DIAGNOSIS — M25511 Pain in right shoulder: Secondary | ICD-10-CM | POA: Diagnosis not present

## 2021-10-13 DIAGNOSIS — Z483 Aftercare following surgery for neoplasm: Secondary | ICD-10-CM

## 2021-10-13 DIAGNOSIS — M25611 Stiffness of right shoulder, not elsewhere classified: Secondary | ICD-10-CM | POA: Diagnosis not present

## 2021-10-13 NOTE — Therapy (Signed)
[facility] @ [place], [place], [address] Phone: [phone]   Fax:  [phone]  Physical Therapy Treatment  Patient Details  Name: Amanda Rich MRN: [id number] Date of Birth: 1966/07/01 Referring Provider (PT): Dr. [name]   Encounter Date: 10/13/2021   PT End of Session - 10/13/21 1655     Visit Number 5    Number of Visits 12    Date for PT Re-Evaluation 11/13/21    Authorization Type 09/14/21- 11/13/2021    Authorization - Visit Number 12    PT Start Time 9741    PT Stop Time 6384    PT Time Calculation (min) 48 min    Activity Tolerance Patient tolerated treatment well    Behavior During Therapy [facility] for tasks assessed/performed             Past Medical History:  Diagnosis Date   Anemia    Cancer ([place])    breast cancer   Depression    Family history of breast cancer    Family history of liver cancer    History of radiation therapy 03/10/21-04/11/21   Right breast- Dr. Gery Pray   Pericarditis    Smoker    Umbilical hernia     Past Surgical History:  Procedure Laterality Date   ABLATION ON ENDOMETRIOSIS     BREAST LUMPECTOMY WITH RADIOACTIVE SEED AND SENTINEL LYMPH NODE BIOPSY Right 02/02/2021   Procedure: RIGHT BREAST LUMPECTOMY WITH RADIOACTIVE SEED AND RIGHT SENTINEL LYMPH NODE [place];  Surgeon: [name], MD;  Location: [facility];  Service: General;  Laterality: Right;   GANGLION CYST EXCISION  2007   L wrist; [facility], [place]   IR IMAGING GUIDED PORT INSERTION  09/10/2020   TUBAL LIGATION     [facility]   UMBILICAL HERNIA REPAIR  2005   [place]    There were no vitals filed for this visit.   Subjective Assessment - 10/13/21 1607     Subjective Pain patch did well and felt pretty good until I was in a store and reached today and my shoulder "popped" and that took me back a little. Pain is much more tolerable overall since starting Ionto but still  painful with movement. I have been doing the supine ABC's pretty well.    Pertinent History Patient was diagnosed on 08/17/2020 with right grade III invasive ductal carcinoma breast cancer. She underwent chemotherapy from 09/13/2020 - 01/18/2021. Then she had a right lumpectomy and sentinel node biopsy (4 negative nodes) on 02/02/2021.  It is ER positive, PR negative, andHER2 positive with a Ki67 of 30%. She has a history of a heart attack in 12/2018.    Patient Stated Goals Get my arm back to normal    Currently in Pain? Yes    Pain Score 4    No pain at rest   Pain Location Shoulder    Pain Orientation Right    Pain Descriptors / Indicators Dull;Aching    Pain Type Acute pain    Pain Onset More than a month ago                               [facility] Adult PT Treatment/Exercise - 10/13/21 0001       Shoulder Exercises: Supine   Diagonals Limitations supine chest press 2 x 5    Other Supine Exercises Alternating isometrics 3 x 30 sec IR  and ER    Other Supine Exercises supine ABC's x 2      Shoulder Exercises: Seated   Other Seated Exercises scapula d2 ext with PT resistance 2 x 10      Shoulder Exercises: Standing   Other Standing Exercises on table ball rolls up and down side to side x 15, On wall with purple ball 2 x 10      Iontophoresis   Type of Iontophoresis Dexamethasone    Location right shoulder over supraspinatus insertion    Dose 89m/ml    Time 4 hour      Manual Therapy   Joint Mobilization Right GH mobs gr 1/2 post/inferior/anterior    Soft tissue mobilization Soft tissue mobilization to right UT, supraspinatus, infraspinaus, Teres Minor and major, rhomboids to decrease muscle tension.  Cross friction massage to supraspinatus tendon    Passive ROM PROM right shoulder flexion, scaption, abd, ER                          PT Long Term Goals - 09/13/21 1400       PT LONG TERM GOAL #1   Title Pt will demonstrate 50 degrees of R shoulder  abduction when RUE is in 90 degrees of abduction.    Baseline 0    Time 4    Period Weeks    Status New    Target Date 10/11/21      PT LONG TERM GOAL #2   Title Pt will report she is able to sleep through the night without being bothered by pain from her R shoulder    Time 4    Period Weeks    Status New    Target Date 10/11/21      PT LONG TERM GOAL #3   Title Pt will report she is able to get dressed without pain and difficulty to allow her to return to PLOF    Time 4    Period Weeks    Status New    Target Date 10/11/21      PT LONG TERM GOAL #4   Title Pt will be independent in a home exercise program for continued strengthening and stretching.    Time 4    Period Weeks    Status New    Target Date 10/11/21                   Plan - 10/13/21 1655     Clinical Impression Statement Overall pain with good improvement after 4th patch with length of improvement continuing to increase. She is able to relax better for PROM and tolerates near full PROM without increased pain. axillary border of pectoralis still very tight and tender, with decreased tenderness at lats. THere is some visible supraspinatus atrophy. Pt. is seeing MD tomorrow and returns here on Tuesday/    Examination-Activity Limitations Dressing;Sleep    Stability/Clinical Decision Making Stable/Uncomplicated    Rehab Potential Good    PT Frequency 2x / week    PT Duration 6 weeks    PT Treatment/Interventions ADLs/Self Care Home Management;Therapeutic exercise;Patient/family education;Iontophoresis 441mml Dexamethasone;Manual techniques;Passive range of motion;Taping;Joint Manipulations    PT Next Visit Plan give pics of ABC, retraction with TB,STM in SL to posterior/lateral shoulder, shoulder stabs as tolerated, Cross friction massage, Continue Ionto as tolerated,progress to strength as pain improves    PT Home Exercise Plan pendulums supine ABCS, D2 ext    Consulted and Agree with  Plan of Care Patient              Patient will benefit from skilled therapeutic intervention in order to improve the following deficits and impairments:  Pain, Postural dysfunction, Impaired UE functional use, Impaired flexibility, Increased fascial restricitons, Decreased strength, Decreased range of motion  Visit Diagnosis: Acute pain of right shoulder  Stiffness of right shoulder, not elsewhere classified  Abnormal posture  Malignant neoplasm of 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, estrogen receptor positive [facility])  Aftercare following surgery for neoplasm     Problem List Patient Active Problem List   Diagnosis Date Noted   Family history of breast cancer    Family history of liver cancer    Malignant neoplasm of 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, estrogen receptor positive ([place]) 08/25/2020   Abnormal chest CT 01/30/2019   History of pericarditis 01/30/2019   Pericarditis 01/22/2019   Chest pain 12/26/2018   History of endometrial ablation 01/21/2018   Family history of colon cancer 09/24/2017   Poor diet 01/24/2017   Weight loss, abnormal 01/24/2017   Smoker 09/22/2016   Mixed hyperlipidemia 08/07/2016   Irregular intermenstrual bleeding 08/07/2016   Depression 08/07/2016   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

## 2021-10-14 ENCOUNTER — Ambulatory Visit: Payer: Self-pay

## 2021-10-14 ENCOUNTER — Ambulatory Visit (INDEPENDENT_AMBULATORY_CARE_PROVIDER_SITE_OTHER): Payer: Medicaid Other | Admitting: Physician Assistant

## 2021-10-14 DIAGNOSIS — M25511 Pain in right shoulder: Secondary | ICD-10-CM

## 2021-10-14 DIAGNOSIS — M25561 Pain in right knee: Secondary | ICD-10-CM | POA: Diagnosis not present

## 2021-10-14 DIAGNOSIS — G8929 Other chronic pain: Secondary | ICD-10-CM | POA: Diagnosis not present

## 2021-10-14 DIAGNOSIS — M25562 Pain in left knee: Secondary | ICD-10-CM

## 2021-10-14 NOTE — Progress Notes (Signed)
Office Visit Note   Patient: Amanda Rich DOBOSZ           Date of Birth: 1966-07-26           MRN: [id number] Visit Date: 10/14/2021              Requested by: Girtha Rm, PA-C No address on file PCP: Girtha Rm, PA-C  Chief Complaint  Patient presents with   Left Knee - Pain   Right Knee - Pain      HPI: Patient is a pleasant 55 year old woman who has recently completed treatment for right-sided breast cancer.  She finished her chemotherapy earlier this year she has also had radiation therapy and immunotherapy.  She is complaining of bilateral knee aching and pain especially in the back of her knees.  She denies any injury.  She admits that she has gotten significantly deconditioned since her illness.  She says the pain in her knees began around when she finished chemotherapy.  She is also complaining of some right shoulder pain and has been working with physical therapy.  Again no particular injury.  She feels therapy is helping  Assessment & Plan: Visit Diagnoses:  1. Chronic pain of both knees   2. Chronic right shoulder pain     Plan: I had a long discussion with the patient.  Certainly would not consider steroid injections at this time.  I do think she should continue to work for strengthening in her knees and overall lower extremities I think that both treatment and deconditioning contributed to some of the achiness in her knees.  With regards to her shoulder she does have some impingement findings and I think continuing with therapy as she has not eligible for a steroid injection on this side.  She will follow-up in 6 weeks.  Follow-Up Instructions: No follow-ups on file.   Ortho Exam  Patient is alert, oriented, no adenopathy, well-dressed, normal affect, normal respiratory effort. Bilateral knees: She does not have any swelling no cellulitis no effusions.  She has some tenderness with deep palpation posteriorly.  Has minimal tenderness with terminal extension  some mild tenderness with terminal flexion bilaterally.  Nontender over the medial lateral joint line.  Mildly tender around the patella.  Good varus valgus stability she does have some quad atrophy bilaterally Examination of her shoulder clinically looks well.  No particular tenderness to palpation.  She has some pain with forward elevation.  She has a positive empty can test.  She also has pain with resisted abduction.  She has internal rotation to mid back and is able to sustain lift off negative speeds test no erythema or redness good grip strength  Imaging: XR Knee 1-2 Views Left  Result Date: 10/14/2021 X-rays 2 views of her left knee demonstrate well-maintained alignment slightly joint space narrowing with slight valgus alignment.  Medial compartment is well-preserved no acute osseous changes  XR Knee 1-2 Views Right  Result Date: 10/14/2021 X-rays of her right knee demonstrate well-maintained alignment with slight valgus variation with some slight narrowing of the lateral compartment overall no acute osseous changes  XR Shoulder Right  Result Date: 10/14/2021 X-rays of her shoulder demonstrate well-maintained alignment reduced humeral head in the glenoid fossa.  No acute osseous changes  No images are attached to the encounter.  Labs: Lab Results  Component Value Date   HGBA1C 4.9 12/26/2018   HGBA1C 4.8 09/22/2016   ESRSEDRATE 1 12/26/2018   REPTSTATUS 11/04/2020 FINAL 10/30/2020  CULT  10/30/2020    NO GROWTH 5 DAYS Performed at [facility], [place] [address], [place], [place] [address]      Lab Results  Component Value Date   ALBUMIN 3.8 08/08/2021   ALBUMIN 3.6 06/28/2021   ALBUMIN 3.7 05/16/2021    No results found for: MG No results found for: VD25OH  No results found for: PREALBUMIN CBC EXTENDED Latest Ref Rng & Units 08/08/2021 06/28/2021 05/16/2021  WBC 4.0 - 10.5 K/uL 6.0 4.5 3.6(L)  RBC 3.87 - 5.11 MIL/uL 4.20 4.09 4.12  HGB 12.0 - 15.0 g/dL 12.0  11.9(L) 11.8(L)  HCT 36.0 - 46.0 % 36.2 35.5(L) 35.4(L)  PLT 150 - 400 K/uL 195 194 184  NEUTROABS 1.7 - 7.7 K/uL 3.0 1.9 1.5(L)  LYMPHSABS 0.7 - 4.0 K/uL 2.5 1.9 1.6     There is no height or weight on file to calculate BMI.  Orders:  Orders Placed This Encounter  Procedures   XR Knee 1-2 Views Right   XR Knee 1-2 Views Left   XR Shoulder Right   Ambulatory referral to Physical Therapy   No orders of the defined types were placed in this encounter.    Procedures: No procedures performed  Clinical Data: No additional findings.  ROS:  All other systems negative, except as noted in the HPI. Review of Systems  Objective: Vital Signs: LMP 10/15/2017 (Approximate)   Specialty Comments:  No specialty comments available.  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, estrogen receptor positive ([place]) 08/25/2020   Abnormal chest CT 01/30/2019   History of pericarditis 01/30/2019   Pericarditis 01/22/2019   Chest pain 12/26/2018   History of endometrial ablation 01/21/2018   Family history of colon cancer 09/24/2017   Poor diet 01/24/2017   Weight loss, abnormal 01/24/2017   Smoker 09/22/2016   Mixed hyperlipidemia 08/07/2016   Irregular intermenstrual bleeding 08/07/2016   Depression 08/07/2016   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 Gery Pray   Pericarditis    Smoker    Umbilical hernia     Family History  Problem Relation Age of Onset   Diabetes Mother    Hypertension Mother    Liver cancer Daughter 54    Diabetes Maternal Aunt    Breast cancer Maternal Aunt 61   Colon cancer Maternal Uncle 66       or prostate cancer   Colon cancer Cousin    Breast cancer Cousin    Autism Half-Brother    Breast cancer Other 70       maternal second cousin   Diabetes Paternal Uncle    Breast cancer Other        dx. 2s, paternal second cousin   Lung cancer Other        dx. 11s, paternal cousin once removed (father's cousin)    Past Surgical History:  Procedure Laterality Date   ABLATION ON ENDOMETRIOSIS     BREAST LUMPECTOMY WITH RADIOACTIVE SEED AND  SENTINEL LYMPH NODE BIOPSY Right 02/02/2021   Procedure: RIGHT BREAST LUMPECTOMY WITH RADIOACTIVE SEED AND RIGHT SENTINEL LYMPH NODE [place];  Surgeon: [name], MD;  Location: [facility];  Service: General;  Laterality: Right;   GANGLION CYST EXCISION  2007   L wrist; [facility], [place]   IR IMAGING GUIDED PORT INSERTION  09/10/2020   TUBAL LIGATION     [facility]   UMBILICAL HERNIA REPAIR  2005   [place]   Social History   Occupational History   Not on file  Tobacco Use   Smoking status: Some Days    Packs/day: 1.00    Years: 25.00    Pack years: 25.00    Types: Cigarettes   Smokeless tobacco: Never   Tobacco comments:    Smoking .5 ppd- 09/22/21  Vaping Use   Vaping Use: Never used  Substance and Sexual Activity   Alcohol use: Yes    Alcohol/week: 1.0 standard drink    Types: 1 Glasses of wine per week   Drug use: No   Sexual activity: Yes    Birth control/protection: Post-menopausal

## 2021-10-18 ENCOUNTER — Encounter: Payer: Self-pay | Admitting: Rehabilitation

## 2021-10-18 ENCOUNTER — Other Ambulatory Visit: Payer: Self-pay

## 2021-10-18 ENCOUNTER — Ambulatory Visit: Payer: Medicaid Other | Admitting: Rehabilitation

## 2021-10-18 DIAGNOSIS — R293 Abnormal posture: Secondary | ICD-10-CM

## 2021-10-18 DIAGNOSIS — Z170 Estrogen receptor positive status [ER+]: Secondary | ICD-10-CM

## 2021-10-18 DIAGNOSIS — Z483 Aftercare following surgery for neoplasm: Secondary | ICD-10-CM

## 2021-10-18 DIAGNOSIS — M25511 Pain in right shoulder: Secondary | ICD-10-CM

## 2021-10-18 DIAGNOSIS — C50211 Malignant neoplasm of upper-inner quadrant of right female breast: Secondary | ICD-10-CM | POA: Diagnosis not present

## 2021-10-18 DIAGNOSIS — M25611 Stiffness of right shoulder, not elsewhere classified: Secondary | ICD-10-CM | POA: Diagnosis not present

## 2021-10-18 NOTE — Patient Instructions (Signed)
Access Code: [email or web address] URL: [email or web address]: 10/25/2022Prepared by: Marcene Brawn TevisExercises  Isometric Shoulder Flexion at Wall - 1 x daily - 7 x weekly - 1 sets - 10 reps - 5 second hold  Isometric Shoulder Abduction at Wall - 1 x daily - 7 x weekly - 1 sets - 10 reps - 5 seconds hold  Standing Row with Anchored Resistance - 1 x daily - 3-4 x weekly - 1-3 sets - 10 reps - 2-3 second hold  Sidelying Shoulder ER with Towel and Dumbbell - 1 x daily - 7 x weekly - 1-3 sets - 10 reps - no hold  Supine Shoulder Alphabet - 1 x daily - 7 x weekly - 1 sets - 1 reps - no hold

## 2021-10-18 NOTE — Therapy (Signed)
[facility] @ [place], [place], [address] Phone: [phone]   Fax:  [phone]  Physical Therapy Treatment  Patient Details  Name: Amanda Rich MRN: [id number] Date of Birth: 1966-08-11 Referring Provider (PT): Dr. [name]   Encounter Date: 10/18/2021   PT End of Session - 10/18/21 1554     Visit Number 6    Number of Visits 12    Date for PT Re-Evaluation 11/13/21    PT Start Time 1503    PT Stop Time 8101    PT Time Calculation (min) 50 min    Activity Tolerance Patient tolerated treatment well    Behavior During Therapy [facility] for tasks assessed/performed             Past Medical History:  Diagnosis Date   Anemia    Cancer ([place])    breast cancer   Depression    Family history of breast cancer    Family history of liver cancer    History of radiation therapy 03/10/21-04/11/21   Right breast- Dr. Gery Pray   Pericarditis    Smoker    Umbilical hernia     Past Surgical History:  Procedure Laterality Date   ABLATION ON ENDOMETRIOSIS     BREAST LUMPECTOMY WITH RADIOACTIVE SEED AND SENTINEL LYMPH NODE BIOPSY Right 02/02/2021   Procedure: RIGHT BREAST LUMPECTOMY WITH RADIOACTIVE SEED AND RIGHT SENTINEL LYMPH NODE [place];  Surgeon: [name], MD;  Location: [facility];  Service: General;  Laterality: Right;   GANGLION CYST EXCISION  2007   L wrist; [facility], [place]   IR IMAGING GUIDED PORT INSERTION  09/10/2020   TUBAL LIGATION     [facility]   UMBILICAL HERNIA REPAIR  2005   [place]    There were no vitals filed for this visit.   Subjective Assessment - 10/18/21 1459     Subjective I slept on in wrong so it is really hurting    Pertinent History Patient was diagnosed on 08/17/2020 with right grade III invasive ductal carcinoma breast cancer. She underwent chemotherapy from 09/13/2020 - 01/18/2021. Then she had a right lumpectomy and sentinel node biopsy (4 negative nodes) on  02/02/2021.  It is ER positive, PR negative, andHER2 positive with a Ki67 of 30%. She has a history of a heart attack in 12/2018.    Currently in Pain? Yes    Pain Score 7     Pain Location Shoulder    Pain Orientation Right    Pain Descriptors / Indicators Aching;Dull    Pain Type Acute pain    Pain Onset More than a month ago    Pain Frequency Intermittent                               [facility] Adult PT Treatment/Exercise - 10/18/21 0001       Shoulder Exercises: Supine   Diagonals Limitations supine chest press 2 x 5, extension isometric presses into table 5" x 10,    Other Supine Exercises Alternating isometrics 3 x 30 sec IR and ER    Other Supine Exercises supine ABC's x 2      Shoulder Exercises: Standing   Retraction Strengthening;Both;10 reps    Theraband Level (Shoulder Retraction) Level 1 (Yellow)    Other Standing Exercises isometric press into ball 5" x 5 flexion and abduction and c/cc wall ball circles  Iontophoresis   Type of Iontophoresis Dexamethasone    Location right shoulder over supraspinatus insertion    Dose 51m/ml    Time 4 hour      Manual Therapy   Joint Mobilization Right GH mobs gr 1/2 post/inferior/anterior    Soft tissue mobilization Soft tissue mobilization to right UT, supraspinatus, infraspinaus, Teres Minor and major, rhomboids to decrease muscle tension.  Cross friction massage to supraspinatus tendon                          PT Long Term Goals - 09/13/21 1400       PT LONG TERM GOAL #1   Title Pt will demonstrate 50 degrees of R shoulder abduction when RUE is in 90 degrees of abduction.    Baseline 0    Time 4    Period Weeks    Status New    Target Date 10/11/21      PT LONG TERM GOAL #2   Title Pt will report she is able to sleep through the night without being bothered by pain from her R shoulder    Time 4    Period Weeks    Status New    Target Date 10/11/21      PT LONG TERM GOAL #3    Title Pt will report she is able to get dressed without pain and difficulty to allow her to return to PLOF    Time 4    Period Weeks    Status New    Target Date 10/11/21      PT LONG TERM GOAL #4   Title Pt will be independent in a home exercise program for continued strengthening and stretching.    Time 4    Period Weeks    Status New    Target Date 10/11/21                   Plan - 10/18/21 1554     Clinical Impression Statement Pt with increased pain upon arrival for no known reason.  Able to tolerate all low level shoulder strengthening well.  Added more isometric presses and scapular manual retraction and depression,  Patch 4/6 applied today    Clinical Decision Making Low    Rehab Potential Good    PT Frequency 2x / week    PT Duration 6 weeks    PT Treatment/Interventions ADLs/Self Care Home Management;Therapeutic exercise;Patient/family education;Iontophoresis 446mml Dexamethasone;Manual techniques;Passive range of motion;Taping;Joint Manipulations    PT Next Visit Plan STM in SL to posterior/lateral shoulder, shoulder stabs as tolerated, Cross friction massage, Continue Ionto as tolerated,progress to strength as pain improves    PT Home Exercise Plan Access Code: [email or web address]    Consulted and Agree with Plan of Care Patient             Patient will benefit from skilled therapeutic intervention in order to improve the following deficits and impairments:  Pain, Postural dysfunction, Impaired UE functional use, Impaired flexibility, Increased fascial restricitons, Decreased strength, Decreased range of motion  Visit Diagnosis: Acute pain of right shoulder  Stiffness of right shoulder, not elsewhere classified  Abnormal posture  Malignant neoplasm of 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, [facility] for [facility]     Problem List Patient Active Problem List   Diagnosis Date Noted   Family history  of breast cancer    Family history of liver cancer  Malignant neoplasm of 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, estrogen receptor positive ([place]) 08/25/2020   Abnormal chest CT 01/30/2019   History of pericarditis 01/30/2019   Pericarditis 01/22/2019   Chest pain 12/26/2018   History of endometrial ablation 01/21/2018   Family history of colon cancer 09/24/2017   Poor diet 01/24/2017   Weight loss, abnormal 01/24/2017   Smoker 09/22/2016   Mixed hyperlipidemia 08/07/2016   Irregular intermenstrual bleeding 08/07/2016   Depression 08/07/2016   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

## 2021-10-20 ENCOUNTER — Encounter: Payer: Medicaid Other | Admitting: Physical Therapy

## 2021-10-25 ENCOUNTER — Encounter: Payer: Self-pay | Admitting: Rehabilitation

## 2021-10-25 ENCOUNTER — Other Ambulatory Visit: Payer: Self-pay

## 2021-10-25 ENCOUNTER — Ambulatory Visit: Payer: Medicaid Other | Admitting: Rehabilitation

## 2021-10-25 DIAGNOSIS — Z79811 Long term (current) use of aromatase inhibitors: Secondary | ICD-10-CM | POA: Diagnosis not present

## 2021-10-25 DIAGNOSIS — C50211 Malignant neoplasm of upper-inner quadrant of right female breast: Secondary | ICD-10-CM | POA: Insufficient documentation

## 2021-10-25 DIAGNOSIS — G620 Drug-induced polyneuropathy: Secondary | ICD-10-CM | POA: Diagnosis not present

## 2021-10-25 DIAGNOSIS — R293 Abnormal posture: Secondary | ICD-10-CM

## 2021-10-25 DIAGNOSIS — T451X5A Adverse effect of antineoplastic and immunosuppressive drugs, initial encounter: Secondary | ICD-10-CM | POA: Diagnosis not present

## 2021-10-25 DIAGNOSIS — Z170 Estrogen receptor positive status [ER+]: Secondary | ICD-10-CM | POA: Diagnosis not present

## 2021-10-25 DIAGNOSIS — Z483 Aftercare following surgery for neoplasm: Secondary | ICD-10-CM | POA: Diagnosis not present

## 2021-10-25 DIAGNOSIS — M25511 Pain in right shoulder: Secondary | ICD-10-CM | POA: Diagnosis not present

## 2021-10-25 DIAGNOSIS — Z419 Encounter for procedure for purposes other than remedying health state, unspecified: Secondary | ICD-10-CM | POA: Diagnosis not present

## 2021-10-25 DIAGNOSIS — M25611 Stiffness of right shoulder, not elsewhere classified: Secondary | ICD-10-CM

## 2021-10-25 DIAGNOSIS — Z923 Personal history of irradiation: Secondary | ICD-10-CM | POA: Diagnosis not present

## 2021-10-25 DIAGNOSIS — F17210 Nicotine dependence, cigarettes, uncomplicated: Secondary | ICD-10-CM | POA: Diagnosis not present

## 2021-10-25 NOTE — Therapy (Signed)
[facility] @ [address] [place], [place], [address] Phone: [phone]   Fax:  [phone]  Physical Therapy Treatment  Patient Details  Name: Amanda Rich MRN: [id number] Date of Birth: December 01, 1966 Referring Provider (PT): Dr. [name]   Encounter Date: 10/25/2021   PT End of Session - 10/25/21 1648     Visit Number 7    Number of Visits 12    Date for PT Re-Evaluation 11/13/21    Authorization - Visit Number 7    Authorization - Number of Visits 12    PT Start Time 1600    PT Stop Time 7867    PT Time Calculation (min) 48 min    Activity Tolerance Patient tolerated treatment well    Behavior During Therapy [facility] for tasks assessed/performed             Past Medical History:  Diagnosis Date   Anemia    Cancer ([place])    breast cancer   Depression    Family history of breast cancer    Family history of liver cancer    History of radiation therapy 03/10/21-04/11/21   Right breast- Dr. Gery Pray   Pericarditis    Smoker    Umbilical hernia     Past Surgical History:  Procedure Laterality Date   ABLATION ON ENDOMETRIOSIS     BREAST LUMPECTOMY WITH RADIOACTIVE SEED AND SENTINEL LYMPH NODE BIOPSY Right 02/02/2021   Procedure: RIGHT BREAST LUMPECTOMY WITH RADIOACTIVE SEED AND RIGHT SENTINEL LYMPH NODE [place];  Surgeon: [name], MD;  Location: [place];  Service: General;  Laterality: Right;   GANGLION CYST EXCISION  2007   L wrist; [facility], [place]   IR IMAGING GUIDED PORT INSERTION  09/10/2020   TUBAL LIGATION     [facility]   UMBILICAL HERNIA REPAIR  2005   [place]    There were no vitals filed for this visit.   Subjective Assessment - 10/25/21 1602     Subjective The shoulder is good.  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    Pertinent History Patient was diagnosed on 08/17/2020 with right grade III invasive ductal carcinoma breast cancer. She underwent chemotherapy from  09/13/2020 - 01/18/2021. Then she had a right lumpectomy and sentinel node biopsy (4 negative nodes) on 02/02/2021.  It is ER positive, PR negative, andHER2 positive with a Ki67 of 30%. She has a history of a heart attack in 12/2018.    Currently in Pain? Yes    Pain Score 1     Pain Location Shoulder    Pain Orientation Right    Pain Descriptors / Indicators Aching    Pain Type Acute pain    Pain Onset More than a month ago    Pain Frequency Intermittent                               [facility] Adult PT Treatment/Exercise - 10/25/21 0001       Shoulder Exercises: Supine   Diagonals Limitations supine chest press 2 x 5, extension isometric presses into table 5" x 10,    Other Supine Exercises supine ABC's x 2      Shoulder Exercises: Standing   Extension Both;15 reps    Theraband Level (Shoulder Extension) Level 1 (Yellow)    Row Both;15 reps    Theraband Level (Shoulder Row) Level 1 (Yellow)    Other  Standing Exercises isometric press into ball 5" x 5 flexion, ER,  and abduction and c/cc wall ball circles      Iontophoresis   Type of Iontophoresis Dexamethasone    Location right shoulder over supraspinatus insertion    Dose 74m/ml    Time 4 hour      Manual Therapy   Joint Mobilization Right GH mobs gr 1/2 post/inferior/anterior    Soft tissue mobilization Soft tissue mobilization to right UT, supraspinatus, infraspinaus, Teres Minor and major, rhomboids to decrease muscle tension.  Cross friction massage to supraspinatus tendon                          PT Long Term Goals - 09/13/21 1400       PT LONG TERM GOAL #1   Title Pt will demonstrate 50 degrees of R shoulder abduction when RUE is in 90 degrees of abduction.    Baseline 0    Time 4    Period Weeks    Status New    Target Date 10/11/21      PT LONG TERM GOAL #2   Title Pt will report she is able to sleep through the night without being bothered by pain from her R shoulder    Time 4     Period Weeks    Status New    Target Date 10/11/21      PT LONG TERM GOAL #3   Title Pt will report she is able to get dressed without pain and difficulty to allow her to return to PLOF    Time 4    Period Weeks    Status New    Target Date 10/11/21      PT LONG TERM GOAL #4   Title Pt will be independent in a home exercise program for continued strengthening and stretching.    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  continued postural and posterior shoulder strengthening as well as ionto patch #5/6    PT Frequency 2x / week    PT Treatment/Interventions ADLs/Self Care Home Management;Therapeutic exercise;Patient/family education;Iontophoresis 463mml Dexamethasone;Manual techniques;Passive range of motion;Taping;Joint Manipulations    PT Next Visit Plan STM in SL to posterior/lateral shoulder, shoulder stabs as tolerated, Cross friction massage, Continue Ionto as tolerated,progress to strength as pain improves    Consulted and Agree with Plan of Care Patient             Patient will benefit from skilled therapeutic intervention in order to improve the following deficits and impairments:     Visit Diagnosis: Acute pain of right shoulder  Stiffness of right shoulder, not elsewhere classified  Abnormal posture  Malignant neoplasm of 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, estrogen receptor positive [facility] for [facility]oplasm     Problem List Patient Active Problem List   Diagnosis Date Noted   Family history of breast cancer    Family history of liver cancer    Malignant neoplasm of 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, estrogen receptor positive ([place]09/12/2019   Abnormal chest CT 01/30/2019   History of pericarditis 01/30/2019  Pericarditis 01/22/2019   Chest pain 12/26/2018   History of endometrial ablation 01/21/2018   Family history of colon cancer 09/24/2017   Poor diet 01/24/2017   Weight loss, abnormal 01/24/2017   Smoker 09/22/2016   Mixed hyperlipidemia 08/07/2016   Irregular intermenstrual bleeding 08/07/2016   Depression 08/07/2016   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

## 2021-10-26 ENCOUNTER — Other Ambulatory Visit: Payer: Self-pay | Admitting: *Deleted

## 2021-10-26 ENCOUNTER — Encounter: Payer: Medicaid Other | Admitting: Family Medicine

## 2021-10-27 ENCOUNTER — Other Ambulatory Visit: Payer: Self-pay

## 2021-10-27 ENCOUNTER — Telehealth: Payer: Self-pay

## 2021-10-27 ENCOUNTER — Inpatient Hospital Stay: Payer: Medicaid Other | Attending: Hematology and Oncology | Admitting: Adult Health

## 2021-10-27 ENCOUNTER — Telehealth: Payer: Self-pay | Admitting: *Deleted

## 2021-10-27 ENCOUNTER — Ambulatory Visit: Payer: Medicaid Other

## 2021-10-27 ENCOUNTER — Inpatient Hospital Stay: Payer: Medicaid Other

## 2021-10-27 VITALS — BP 103/65 | HR 94 | Temp 97.7°F | Resp 18 | Ht 65.0 in | Wt 120.9 lb

## 2021-10-27 DIAGNOSIS — M25611 Stiffness of right shoulder, not elsewhere classified: Secondary | ICD-10-CM

## 2021-10-27 DIAGNOSIS — C50211 Malignant neoplasm of upper-inner quadrant of right female breast: Secondary | ICD-10-CM

## 2021-10-27 DIAGNOSIS — Z483 Aftercare following surgery for neoplasm: Secondary | ICD-10-CM | POA: Diagnosis not present

## 2021-10-27 DIAGNOSIS — M25511 Pain in right shoulder: Secondary | ICD-10-CM | POA: Insufficient documentation

## 2021-10-27 DIAGNOSIS — G620 Drug-induced polyneuropathy: Secondary | ICD-10-CM | POA: Insufficient documentation

## 2021-10-27 DIAGNOSIS — Z923 Personal history of irradiation: Secondary | ICD-10-CM | POA: Diagnosis not present

## 2021-10-27 DIAGNOSIS — Z79811 Long term (current) use of aromatase inhibitors: Secondary | ICD-10-CM | POA: Insufficient documentation

## 2021-10-27 DIAGNOSIS — T451X5A Adverse effect of antineoplastic and immunosuppressive drugs, initial encounter: Secondary | ICD-10-CM | POA: Insufficient documentation

## 2021-10-27 DIAGNOSIS — F17210 Nicotine dependence, cigarettes, uncomplicated: Secondary | ICD-10-CM | POA: Diagnosis not present

## 2021-10-27 DIAGNOSIS — R293 Abnormal posture: Secondary | ICD-10-CM | POA: Diagnosis not present

## 2021-10-27 DIAGNOSIS — Z170 Estrogen receptor positive status [ER+]: Secondary | ICD-10-CM | POA: Diagnosis not present

## 2021-10-27 LAB — CMP (CANCER CENTER ONLY)
ALT: 10 U/L (ref 0–44)
AST: 18 U/L (ref 15–41)
Albumin: 3.9 g/dL (ref 3.5–5.0)
Alkaline Phosphatase: 69 U/L (ref 38–126)
Anion gap: 8 (ref 5–15)
BUN: 9 mg/dL (ref 6–20)
CO2: 28 mmol/L (ref 22–32)
Calcium: 9.2 mg/dL (ref 8.9–10.3)
Chloride: 107 mmol/L (ref 98–111)
Creatinine: 0.76 mg/dL (ref 0.44–1.00)
GFR, Estimated: >60 mL/min (ref >60)
Glucose, Bld: 61 mg/dL — ABNORMAL LOW (ref 70–99)
Potassium: 3.8 mmol/L (ref 3.5–5.1)
Sodium: 143 mmol/L (ref 135–145)
Total Bilirubin: 0.4 mg/dL (ref 0.3–1.2)
Total Protein: 7.3 g/dL (ref 6.5–8.1)

## 2021-10-27 LAB — CBC WITH DIFFERENTIAL (CANCER CENTER ONLY)
Abs Immature Granulocytes: 0 10*3/uL (ref 0.00–0.07)
Basophils Absolute: 0 10*3/uL (ref 0.0–0.1)
Basophils Relative: 1 %
Eosinophils Absolute: 0.2 10*3/uL (ref 0.0–0.5)
Eosinophils Relative: 3 %
HCT: 38.1 % (ref 36.0–46.0)
Hemoglobin: 12.4 g/dL (ref 12.0–15.0)
Immature Granulocytes: 0 %
Lymphocytes Relative: 37 %
Lymphs Abs: 2 10*3/uL (ref 0.7–4.0)
MCH: 28.6 pg (ref 26.0–34.0)
MCHC: 32.5 g/dL (ref 30.0–36.0)
MCV: 88 fL (ref 80.0–100.0)
Monocytes Absolute: 0.5 10*3/uL (ref 0.1–1.0)
Monocytes Relative: 9 %
Neutro Abs: 2.8 10*3/uL (ref 1.7–7.7)
Neutrophils Relative %: 50 %
Platelet Count: 200 10*3/uL (ref 150–400)
RBC: 4.33 MIL/uL (ref 3.87–5.11)
RDW: 13.7 % (ref 11.5–15.5)
WBC Count: 5.5 10*3/uL (ref 4.0–10.5)
nRBC: 0 % (ref 0.0–0.2)

## 2021-10-27 LAB — VITAMIN B12: Vitamin B-12: 383 pg/mL (ref 180–914)

## 2021-10-27 LAB — VITAMIN D 25 HYDROXY (VIT D DEFICIENCY, FRACTURES): Vit D, 25-Hydroxy: 37.61 ng/mL (ref 30–100)

## 2021-10-27 NOTE — Assessment & Plan Note (Signed)
Patient palpated a right breast lump x38yrand a left breast lump x222yrDiagnostic mammogram and USKoreahowed in the right breast, a 2.7cm mass 5cm from the nipple and 0.8cm mass 1cm from the nipple at the 12:30 position, with borderline cortical thickening in the right axilla, and in the left breast, a 4.0cm mass at the 11 o'clock position representing a hamartoma. 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  Treatment plan: 1. Neoadjuvant chemotherapy with TCLimavilleerjeta 6 cyclescompleted 1/25/2022followed by Herceptin Perjetamaintenance for 1 year completed 08/30/2021 2. Followed by breast conserving surgery if possible with sentinel lymph node study2/08/2021: Complete pathologic response, 0/4lymph nodes negative 3. Followed by adjuvant radiation therapy3/21/22-4/15/22 4.Followed by adjuvant antiestrogen therapywith letrozole started 06/28/2021, changed to anastrozole  ------------------------------------------------------------------------------------------------------------------------------------------- Current treatment:Anastrozole  Echocardiogram 09/06/2020: EF 60 to 65%  Amanda Rich has having increased difficulty with neuropathy.  She also feels like she is in a 9069ear old body.  We were going to do the following things.  1.  She will have labs today with a CBC c-Met vitamin B12 and vitamin D level.  She has recently undergone thyroid testing that was normal. 2.  I have asked that she stop taking her anastrozole for the time being.  I reassured her that it is safe to do so. 3.  She will increase her gabapentin to 300 mg p.o. twice daily she will do this for a week or 2 to see if she is able to tolerate it and get it builds up in her system. 4.  I have sent in an order to physical therapy to see if they are able to evaluate and treat her neuropathy. 5.   Considering the increased symptoms and burden this is having on Amanda Rich I think she needs to see a specialist.  I have placed a referral for her to see Dr. VaMickeal Skinnero evaluate and treat.  Have a survivorship visit with Amanda Rich next month.  She will touch base with usKoreabout the above.  She will see Dr. VaMickeal Skinnern the interim.  She knows to call for any questions or concerns prior to her next appointment.

## 2021-10-27 NOTE — Patient Instructions (Signed)
Access Code: [email or web address] URL: [email or web address] Date: 10/27/2021 Prepared by: Cheral Almas  Exercises Shoulder External Rotation and Scapular Retraction with Resistance - 1 x daily - 3-5 x weekly - 1-2 sets - 10 reps Standing Single Arm Elbow Flexion with Resistance - 1 x daily - 3-5 x weekly - 1-2 sets - 10 reps

## 2021-10-27 NOTE — Telephone Encounter (Signed)
Called pt to review low glucose result from lab work today.  Per Mend[place] Amanda Rich, instructed pt to have small frequent snacks during the day to help maintain healthy blood glucose.  Pt verbalized understanding

## 2021-10-27 NOTE — Telephone Encounter (Signed)
Received call from pt on 10/26/21 with complaint of increase in neuropathy in bilateral lower extremities interfering with ADL's.  Per MD pt needing to be seen by NP on 10/27/21 for further evaluation and tx. Pt educated on appt date and time and verbalized understanding.

## 2021-10-27 NOTE — Therapy (Signed)
[facility] @ [address] [place], [place], [address] Phone: [phone]   Fax:  [phone]  Physical Therapy Treatment  Patient Details  Name: Amanda Rich MRN: [id number] Date of Birth: 07/13/1966 Referring Provider (PT): Dr. [name]   Encounter Date: 10/27/2021   PT End of Session - 10/27/21 1612     Visit Number 8    Number of Visits 12    Date for PT Re-Evaluation 11/13/21    Authorization Type 09/14/21- 11/13/2021    Authorization - Visit Number 8    Authorization - Number of Visits 12    PT Start Time 5400    PT Stop Time 8676    PT Time Calculation (min) 51 min    Activity Tolerance Patient tolerated treatment well    Behavior During Therapy [facility] for tasks assessed/performed             Past Medical History:  Diagnosis Date   Anemia    Cancer ([place])    breast cancer   Depression    Family history of breast cancer    Family history of liver cancer    History of radiation therapy 03/10/21-04/11/21   Right breast- Dr. Gery Pray   Pericarditis    Smoker    Umbilical hernia     Past Surgical History:  Procedure Laterality Date   ABLATION ON ENDOMETRIOSIS     BREAST LUMPECTOMY WITH RADIOACTIVE SEED AND SENTINEL LYMPH NODE BIOPSY Right 02/02/2021   Procedure: RIGHT BREAST LUMPECTOMY WITH RADIOACTIVE SEED AND RIGHT SENTINEL LYMPH NODE [place];  Surgeon: [name], MD;  Location: [facility];  Service: General;  Laterality: Right;   GANGLION CYST EXCISION  2007   L wrist; [facility], [place]   IR IMAGING GUIDED PORT INSERTION  09/10/2020   TUBAL LIGATION     [facility]   UMBILICAL HERNIA REPAIR  2005   [place]    There were no vitals filed for this visit.   Subjective Assessment - 10/27/21 1605     Subjective The neuropathy in my feet, legs and hands is getting worse and they are going to have me see a neurologist. My shoulder is feeling better overall, but I am still not using the  right arm alot.  The patch gives me alot of relief, but I still avoid using my arm.    Pertinent History Patient was diagnosed on 08/17/2020 with right grade III invasive ductal carcinoma breast cancer. She underwent chemotherapy from 09/13/2020 - 01/18/2021. Then she had a right lumpectomy and sentinel node biopsy (4 negative nodes) on 02/02/2021.  It is ER positive, PR negative, andHER2 positive with a Ki67 of 30%. She has a history of a heart attack in 12/2018.    Patient Stated Goals Get my arm back to normal    Currently in Pain? Yes    Pain Score 7     Pain Location Shoulder    Pain Orientation Right    Pain Descriptors / Indicators Aching    Pain Type Acute pain    Pain Onset More than a month ago    Pain Frequency Intermittent   with raising arm                              [facility] Adult PT Treatment/Exercise - 10/27/21 0001       Shoulder Exercises: Supine   Diagonals Limitations chest press 1# x 10  Other Supine Exercises supine ABC's x 1, no resistance, x1 with 1#    Other Supine Exercises Supine AA shoulder flexion x 10      Shoulder Exercises: Standing   External Rotation Strengthening;Both;15 reps    Theraband Level (Shoulder External Rotation) Level 1 (Yellow)    Extension Strengthening;Both    Theraband Level (Shoulder Extension) Level 1 (Yellow)    Retraction Strengthening;Both;20 reps    Theraband Level (Shoulder Retraction) Level 1 (Yellow)      Iontophoresis   Type of Iontophoresis Dexamethasone    Location right shoulder over supraspinatus insertion    Dose 66m/ml    Time 4 hour      Manual Therapy   Joint Mobilization Right GH mobs gr 1/2 post/inferior/anterior    Soft tissue mobilization Soft tissue mobilization to right UT, supraspinatus, infraspinaus, Teres Minor and major, rhomboids to decrease muscle tension.  Cross friction massage to supraspinatus tendon                     PT Education - 10/27/21 1652     Education  Details Educated in bilateral ER with TB and elbow flexion with TB    Person(s) Educated Patient    Methods Demonstration;Handout    Comprehension Returned demonstration                 PT Long Term Goals - 09/13/21 1400       PT LONG TERM GOAL #1   Title Pt will demonstrate 50 degrees of R shoulder abduction when RUE is in 90 degrees of abduction.    Baseline 0    Time 4    Period Weeks    Status New    Target Date 10/11/21      PT LONG TERM GOAL #2   Title Pt will report she is able to sleep through the night without being bothered by pain from her R shoulder    Time 4    Period Weeks    Status New    Target Date 10/11/21      PT LONG TERM GOAL #3   Title Pt will report she is able to get dressed without pain and difficulty to allow her to return to PLOF    Time 4    Period Weeks    Status New    Target Date 10/11/21      PT LONG TERM GOAL #4   Title Pt will be independent in a home exercise program for continued strengthening and stretching.    Time 4    Period Weeks    Status New    Target Date 10/11/21                   Plan - 10/27/21 1701     Clinical Impression Statement Pts pain overall improved but she is still reluctant to use her right arm.  She was shown how to range her shouder using her left arm in supine today and did quite well.  She continues with UT compensation with active use of shoulder.  She was able to progress repetitions of theraband today without pain and to add 1# wt. for supine exs.    Examination-Activity Limitations Dressing;Sleep    Stability/Clinical Decision Making Stable/Uncomplicated    Rehab Potential Good    PT Frequency 2x / week    PT Duration 6 weeks    PT Treatment/Interventions ADLs/Self Care Home Management;Therapeutic exercise;Patient/family education;Iontophoresis 449mml Dexamethasone;Manual techniques;Passive range of motion;Taping;Joint  Manipulations    PT Next Visit Plan STM in SL to posterior/lateral  shoulder, shoulder stabs as tolerated, Cross friction massage, Continue Ionto as tolerated,progress to strength as pain improves    PT Home Exercise Plan Access Code: [email or web address], ER with TB, elbow flex with TB, supine AAROM for shoulder flexion    Consulted and Agree with Plan of Care Patient             Patient will benefit from skilled therapeutic intervention in order to improve the following deficits and impairments:     Visit Diagnosis: Acute pain of right shoulder  Stiffness of right shoulder, not elsewhere classified  Abnormal posture  Malignant neoplasm of 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, estrogen receptor positive [facility])  Aftercare following surgery for neoplasm     Problem List Patient Active Problem List   Diagnosis Date Noted   Family history of breast cancer    Family history of liver cancer    Malignant neoplasm of 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, estrogen receptor positive ([place]) 08/25/2020   Abnormal chest CT 01/30/2019   History of pericarditis 01/30/2019   Pericarditis 01/22/2019   Chest pain 12/26/2018   History of endometrial ablation 01/21/2018   Family history of colon cancer 09/24/2017   Poor diet 01/24/2017   Weight loss, abnormal 01/24/2017   Smoker 09/22/2016   Mixed hyperlipidemia 08/07/2016   Irregular intermenstrual bleeding 08/07/2016   Depression 08/07/2016   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

## 2021-10-27 NOTE — Progress Notes (Signed)
[employer] Cancer Follow up:    Girtha Rm, PA-C No address on file   DIAGNOSIS: Cancer Staging Malignant neoplasm of 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, estrogen receptor positive ([place]) Staging form: Breast, AJCC 8th Edition - Clinical stage from 09/01/2020: Stage IIA (cT2, cN0, cM0, G3, ER+, PR-, HER2+) - Unsigned Stage prefix: Initial diagnosis Histologic grading system: 3 grade system Laterality: Right Staged by: Pathologist and managing physician Stage used in treatment planning: Yes National guidelines used in treatment planning: Yes Type of national guideline used in treatment planning: NCCN   SUMMARY OF ONCOLOGIC HISTORY: Oncology History  Malignant neoplasm of 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, estrogen receptor positive ([place])  08/25/2020 Initial Diagnosis   Patient palpated a right breast lump x6yrand a left breast lump x226yrDiagnostic mammogram and US[place]oreahowed in the right breast, a 2.7cm mass 5cm from the nipple and 0.8cm mass 1cm from the nipple at the 12:30 position, with borderline cortical thickening in the right axilla, and in the left breast, a 4.0cm mass at the 11 o'clock position representing a hamartoma. 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   09/13/2020 - 01/18/2021 Chemotherapy   [facility]        02/02/2021 Surgery   Right lumpectomy ([facility]): no residual carcinoma, 4 right axillary lymph nodes negative for carcinoma.   02/21/2021 - 08/30/2021 Chemotherapy      Patient is on Antibody Plan: BREAST TRASTUZUMAB + PERTUZUMAB Q21D     03/11/2021 - 04/11/2021 Radiation Therapy   Adjuvant radiation   09/2021 -  Anti-estrogen oral therapy   Anastrozole daily     CURRENT THERAPY: Anastrozole  INTERVAL HISTORY: TyArlyn Dunning38.o.  female returns for evaluation for pain and numbness in her hands and feet.  She is concerned because her neuropathy is worsening.  She says that it encompasses the entire palm of her hands and the entire soles of her feet.  She says that the neuropathy began during the chemotherapy and felt like a going to sleep sensation, however at the completion of chemo she notes that it has worsen, and now her hands and feet have a burning pain, with occasional pins and sharp pain.  The pain is constant in her hands.  She notes that if she is on her feet for extended periods her ankles will swell.    She will occasionally wake up with frozen joints in her hands and feet.  This is painful and hurts her.  She has taken gabapentin 30077mO QHS and sometimes BID without relief.  She is going to PT on Tuesday and thursdays.  She has decreased range of motion in her right shoulder.    Veronika drops things more frequently, she is able to write but notes a handwriting change.  She is able to walk, but does not walk far due to feeling tired and winded.  She denies any balance issues.     Patient Active Problem List   Diagnosis Date Noted   Family history of breast cancer    Family history of liver cancer    Malignant neoplasm of 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, estrogen receptor positive ([place]9/12/2019   Abnormal chest CT 01/30/2019   History of pericarditis 01/30/2019   Pericarditis 01/22/2019  Chest pain 12/26/2018   History of endometrial ablation 01/21/2018   Family history of colon cancer 09/24/2017   Poor diet 01/24/2017   Weight loss, abnormal 01/24/2017   Smoker 09/22/2016   Mixed hyperlipidemia 08/07/2016   Irregular intermenstrual bleeding 08/07/2016   Depression 08/07/2016   Fat necrosis of peritoneum ([place]) 08/07/2016   Pelvic congestion syndrome 08/07/2016    is allergic to clarithromycin.  MEDICAL HISTORY: Past Medical History:  Diagnosis Date   Anemia    Cancer ([place])     breast cancer   Depression    Family history of breast cancer    Family history of liver cancer    History of radiation therapy 03/10/21-04/11/21   Right breast- Dr. Gery Pray   Pericarditis    Smoker    Umbilical hernia     SURGICAL HISTORY: Past Surgical History:  Procedure Laterality Date   ABLATION ON ENDOMETRIOSIS     BREAST LUMPECTOMY WITH RADIOACTIVE SEED AND SENTINEL LYMPH NODE BIOPSY Right 02/02/2021   Procedure: RIGHT BREAST LUMPECTOMY WITH RADIOACTIVE SEED AND RIGHT SENTINEL LYMPH NODE MAPPING;  Surgeon: [name], MD;  Location: [facility];  Service: General;  Laterality: Right;   GANGLION CYST EXCISION  2007   L wrist; [facility], [place]   IR IMAGING GUIDED PORT INSERTION  09/10/2020   TUBAL LIGATION     [facility]   UMBILICAL HERNIA REPAIR  2005   [place]    SOCIAL HISTORY: Social History   Socioeconomic History   Marital status: Legally Separated    Spouse name: Not on file   Number of children: 3   Years of education: Not on file   Highest education level: Associate degree: academic program  Occupational History   Not on file  Tobacco Use   Smoking status: Some Days    Packs/day: 1.00    Years: 25.00    Pack years: 25.00    Types: Cigarettes   Smokeless tobacco: Never   Tobacco comments:    Smoking .5 ppd- 09/22/21  Vaping Use   Vaping Use: Never used  Substance and Sexual Activity   Alcohol use: Yes    Alcohol/week: 1.0 standard drink    Types: 1 Glasses of wine per week   Drug use: No   Sexual activity: Yes    Birth control/protection: Post-menopausal  Other Topics Concern   Not on file  Social History Narrative   Not on file   Social Determinants of Health   Financial Resource Strain: Not on file  Food Insecurity: Not on file  Transportation Needs: Not on file  Physical Activity: Not on file  Stress: Not on file  Social Connections: Not on file  Intimate Partner Violence: Not on file    FAMILY HISTORY: Family History   Problem Relation Age of Onset   Diabetes Mother    Hypertension Mother    Liver cancer Daughter 35   Diabetes Maternal Aunt    Breast cancer Maternal Aunt 56   Colon cancer Maternal Uncle 18       or prostate cancer   Colon cancer Cousin    Breast cancer Cousin    Autism Half-Brother    Breast cancer Other 55       maternal second cousin   Diabetes Paternal Uncle    Breast cancer Other        dx. 38s, paternal second cousin   Lung cancer Other        dx. 27s, paternal cousin once removed (father's  cousin)    Review of Systems  Constitutional:  Positive for fatigue. Negative for appetite change, chills, fever and unexpected weight change.  HENT:   Negative for hearing loss, lump/mass and trouble swallowing.   Eyes:  Negative for eye problems and icterus.  Respiratory:  Negative for chest tightness, cough and shortness of breath.   Cardiovascular:  Negative for chest pain, leg swelling and palpitations.  Gastrointestinal:  Negative for abdominal distention, abdominal pain, constipation, diarrhea, nausea and vomiting.  Endocrine: Negative for hot flashes.  Genitourinary:  Negative for difficulty urinating.   Musculoskeletal:  Positive for arthralgias.  Skin:  Negative for itching and rash.  Neurological:  Positive for numbness. Negative for dizziness, extremity weakness and headaches.  Hematological:  Negative for adenopathy. Does not bruise/bleed easily.  Psychiatric/Behavioral:  Negative for depression. The patient is not nervous/anxious.      PHYSICAL EXAMINATION  ECOG PERFORMANCE STATUS: 2 - Symptomatic, <50% confined to bed  Vitals:   10/27/21 1401  BP: 103/65  Pulse: 94  Resp: 18  Temp: 97.7 F (36.5 C)  SpO2: 100%    Physical Exam Constitutional:      General: She is not in acute distress.    Appearance: Normal appearance. She is not toxic-appearing.  HENT:     Head: Normocephalic and atraumatic.  Eyes:     General: No scleral icterus. Cardiovascular:      Rate and Rhythm: Normal rate and regular rhythm.     Pulses: Normal pulses.     Heart sounds: Normal heart sounds.  Pulmonary:     Effort: Pulmonary effort is normal.     Breath sounds: Normal breath sounds.  Abdominal:     General: Abdomen is flat. Bowel sounds are normal. There is no distension.     Palpations: Abdomen is soft.     Tenderness: There is no abdominal tenderness.  Musculoskeletal:        General: No swelling.     Cervical back: Neck supple.     Right lower leg: No edema.     Left lower leg: No edema.  Lymphadenopathy:     Cervical: No cervical adenopathy.  Skin:    General: Skin is warm and dry.     Findings: No rash.  Neurological:     General: No focal deficit present.     Mental Status: She is alert.  Psychiatric:        Mood and Affect: Mood normal.        Behavior: Behavior normal.    LABORATORY DATA:  CBC    Component Value Date/Time   WBC 6.0 08/08/2021 0918   WBC 6.4 10/30/2020 1200   RBC 4.20 08/08/2021 0918   HGB 12.0 08/08/2021 0918   HCT 36.2 08/08/2021 0918   PLT 195 08/08/2021 0918   MCV 86.2 08/08/2021 0918   MCH 28.6 08/08/2021 0918   MCHC 33.1 08/08/2021 0918   RDW 13.0 08/08/2021 0918   LYMPHSABS 2.5 08/08/2021 0918   MONOABS 0.4 08/08/2021 0918   EOSABS 0.1 08/08/2021 0918   BASOSABS 0.0 08/08/2021 0918    CMP     Component Value Date/Time   NA 141 08/08/2021 0918   K 3.7 08/08/2021 0918   CL 106 08/08/2021 0918   CO2 26 08/08/2021 0918   GLUCOSE 103 (H) 08/08/2021 0918   BUN 11 08/08/2021 0918   CREATININE 0.74 08/08/2021 0918   CREATININE 0.68 09/24/2017 0827   CALCIUM 9.5 08/08/2021 0918   PROT 7.1 08/08/2021 0960  ALBUMIN 3.8 08/08/2021 0918   AST 18 08/08/2021 0918   ALT 14 08/08/2021 0918   ALKPHOS 70 08/08/2021 0918   BILITOT 0.4 08/08/2021 0918   GFRNONAA >60 08/08/2021 0918   GFRNONAA >89 09/22/2016 1501   GFRAA >60 09/20/2020 1415   GFRAA >89 09/22/2016 1501       ASSESSMENT and THERAPY  PLAN:   Malignant neoplasm of 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, estrogen receptor positive ([place]) Patient palpated a right breast lump x41yr and a left breast lump x39yr. Diagnostic mammogram and US showed in the right breast, a 2.7cm mass 5cm from the nipple and 0.8cm mass 1cm from the nipple at the 12:30 position, with borderline cortical thickening in the right axilla, and in the left breast, a 4.0cm mass at the 11 o'clock position representing a hamartoma. 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   Treatment plan: 1. Neoadjuvant chemotherapy with TCH Perjeta 6 cycles completed 01/18/2021 followed by Herceptin Perjeta maintenance for 1 year completed 08/30/2021 2. Followed by breast conserving surgery if possible with sentinel lymph node study 02/02/2021: Complete pathologic response, 0/4 lymph nodes negative 3. Followed by adjuvant radiation therapy 03/14/21- 04/08/21 4.  Followed by adjuvant antiestrogen therapy with letrozole started 06/28/2021, changed to anastrozole  ------------------------------------------------------------------------------------------------------------------------------------------- Current treatment: Anastrozole  Echocardiogram 09/06/2020: EF 60 to 65%  Cybele has having increased difficulty with neuropathy.  She also feels like she is in a 53 year old body.  We were going to do the following things.  1.  She will have labs today with a CBC c-Met vitamin B12 and vitamin D level.  She has recently undergone thyroid testing that was normal. 2.  I have asked that she stop taking her anastrozole for the time being.  I reassured her that it is safe to do so. 3.  She will increase her gabapentin to 300 mg p.o. twice daily she will do this for a week or 2 to see if she is able to tolerate it and get it builds up in her  system. 4.  I have sent in an order to physical therapy to see if they are able to evaluate and treat her neuropathy. 5.  Considering the increased symptoms and burden this is having on Asharia I think she needs to see a specialist.  I have placed a referral for her to see Dr. Mickeal Skinner to evaluate and treat.  Have a survivorship visit with Gwyndolyn next month.  She will touch base with [place] about the above.  She will see Dr. Mickeal Skinner in the interim.  She knows to call for any questions or concerns prior to her next appointment.   All questions were answered. The patient knows to call the clinic with any problems, questions or concerns. We can certainly see the patient much sooner if necessary.  Total encounter time: 30 minutes in chart review, lab review, order entry, care coordination, discussion with other providers, and documentation of the encounter.  Wilber Bihari, NP 10/27/21 2:48 PM Medical Oncology and Hematology [facility] [place], [place] [address] Tel. [phone]    Fax. [phone]  *Total Encounter Time as defined by the Centers for Medicare and Medicaid Services includes, in addition to the face-to-face time of a patient visit (documented in the note above) non-face-to-face time: obtaining and reviewing outside history,  ordering and reviewing medications, tests or procedures, care coordination (communications with other health care professionals or caregivers) and documentation in the medical record.

## 2021-10-28 ENCOUNTER — Telehealth: Payer: Self-pay | Admitting: Adult Health

## 2021-10-28 NOTE — Telephone Encounter (Signed)
Scheduled appointment per 11/03 los. Patient is aware of time and date. If need to reschedule, please call. She has limited availability.

## 2021-11-02 ENCOUNTER — Ambulatory Visit: Payer: Medicaid Other | Admitting: Physical Therapy

## 2021-11-02 ENCOUNTER — Encounter: Payer: Self-pay | Admitting: Physical Therapy

## 2021-11-02 ENCOUNTER — Other Ambulatory Visit: Payer: Self-pay

## 2021-11-02 DIAGNOSIS — C50211 Malignant neoplasm of upper-inner quadrant of right female breast: Secondary | ICD-10-CM | POA: Diagnosis not present

## 2021-11-02 DIAGNOSIS — M25511 Pain in right shoulder: Secondary | ICD-10-CM

## 2021-11-02 DIAGNOSIS — Z923 Personal history of irradiation: Secondary | ICD-10-CM | POA: Diagnosis not present

## 2021-11-02 DIAGNOSIS — G620 Drug-induced polyneuropathy: Secondary | ICD-10-CM | POA: Diagnosis not present

## 2021-11-02 DIAGNOSIS — M25611 Stiffness of right shoulder, not elsewhere classified: Secondary | ICD-10-CM

## 2021-11-02 DIAGNOSIS — Z170 Estrogen receptor positive status [ER+]: Secondary | ICD-10-CM

## 2021-11-02 DIAGNOSIS — T451X5A Adverse effect of antineoplastic and immunosuppressive drugs, initial encounter: Secondary | ICD-10-CM | POA: Diagnosis not present

## 2021-11-02 DIAGNOSIS — R293 Abnormal posture: Secondary | ICD-10-CM

## 2021-11-02 DIAGNOSIS — Z483 Aftercare following surgery for neoplasm: Secondary | ICD-10-CM

## 2021-11-02 DIAGNOSIS — Z79811 Long term (current) use of aromatase inhibitors: Secondary | ICD-10-CM | POA: Diagnosis not present

## 2021-11-02 DIAGNOSIS — F17210 Nicotine dependence, cigarettes, uncomplicated: Secondary | ICD-10-CM | POA: Diagnosis not present

## 2021-11-03 NOTE — Therapy (Addendum)
[name] @ [place], [place], [address] Phone: [phone]   Fax:  [phone]  Physical Therapy Treatment  Patient Details  Name: Amanda Rich MRN: [id number] Date of Birth: April 20, 1966 Referring Provider (PT): Dr. Erroll Rich   Encounter Date: 11/02/2021   PT End of Session - 11/02/21 1408     Visit Number 9    Number of Visits 12    Date for PT Re-Evaluation 11/13/21    Authorization Type 09/14/21- 11/13/2021    Authorization - Visit Number 9    Authorization - Number of Visits 12    PT Start Time 2248    PT Stop Time 2500    PT Time Calculation (min) 40 min    Activity Tolerance Patient tolerated treatment well    Behavior During Therapy Amanda Rich for tasks assessed/performed             Past Medical History:  Diagnosis Date   Anemia    Cancer ([place])    breast cancer   Depression    Family history of breast cancer    Family history of liver cancer    History of radiation therapy 03/10/21-04/11/21   Right breast- Dr. Gery Rich   Pericarditis    Smoker    Umbilical hernia     Past Surgical History:  Procedure Laterality Date   ABLATION ON ENDOMETRIOSIS     BREAST LUMPECTOMY WITH RADIOACTIVE SEED AND SENTINEL LYMPH NODE BIOPSY Right 02/02/2021   Procedure: RIGHT BREAST LUMPECTOMY WITH RADIOACTIVE SEED AND RIGHT SENTINEL LYMPH NODE [place];  Surgeon: Amanda Luna, MD;  Location: [facility];  Service: General;  Laterality: Right;   GANGLION CYST EXCISION  2007   L wrist; [facility], [place]   IR IMAGING GUIDED PORT INSERTION  09/10/2020   TUBAL LIGATION     [facility]   UMBILICAL HERNIA REPAIR  2005   [place]    There were no vitals filed for this visit.   Subjective Assessment - 11/02/21 1412     Subjective Is my shoulder ever going to get better. I hurt my right low back over the weekend, it is better today. I did n't do  the standing exercises this weekend.    Pertinent History  Patient was diagnosed on 08/17/2020 with right grade III invasive ductal carcinoma breast cancer. She underwent chemotherapy from 09/13/2020 - 01/18/2021. Then she had a right lumpectomy and sentinel node biopsy (4 negative nodes) on 02/02/2021.  It is ER positive, PR negative, andHER2 positive with a Ki67 of 30%. She has a history of a heart attack in 12/2018.    Currently in Pain? Yes    Pain Score 3     Pain Location Shoulder    Pain Orientation Right    Pain Descriptors / Indicators Dull;Sore    Aggravating Factors  Abduction with elbow bent    Multiple Pain Sites No                                             PT Long Term Goals - 09/13/21 1400       PT LONG TERM GOAL #1   Title Pt will demonstrate 50 degrees of R shoulder abduction when RUE is in 90 degrees of abduction.    Baseline 0    Time 4    Period Weeks  Status New    Target Date 10/11/21      PT LONG TERM GOAL #2   Title Pt will report she is able to sleep through the night without being bothered by pain from her R shoulder    Time 4    Period Weeks    Status New    Target Date 10/11/21      PT LONG TERM GOAL #3   Title Pt will report she is able to get dressed without pain and difficulty to allow her to return to PLOF    Time 4    Period Weeks    Status New    Target Date 10/11/21      PT LONG TERM GOAL #4   Title Pt will be independent in a home exercise program for continued strengthening and stretching.    Time 4    Period Weeks    Status New    Target Date 10/11/21                   Plan - 11/03/21 1621     Clinical Impression Statement Pt arrives with no new complaints and reports being very afraid or nervous to use her RT arm "normally." PTA encouraged pt to move and discussed ways to reduce her anxiety and improve her confidence especially as her should pain is decreasing. PTA suggested pt stop practicing the "chicken wing" position as this is the impingement  position and that could casue her tendonitis pain/irritation to linger. Pt agreed to stop doing this motion. PTA advised pt todays ionto patch would be her last. Pt did not have any pain with her exercises but has difficulty with proper [place] positioning.    Examination-Activity Limitations Dressing;Sleep    Stability/Clinical Decision Making Stable/Uncomplicated    Rehab Potential Good    PT Frequency 2x / week    PT Treatment/Interventions ADLs/Self Care Home Management;Therapeutic exercise;Patient/family education;Iontophoresis 11m/ml Dexamethasone;Manual techniques;Passive range of motion;Taping;Joint Manipulations    PT Next Visit Plan Continue with Rt shoulder strength and stabilization, scap stabilization, DC ionto    PT Home Exercise Plan Access Code: [email or web address], ER with TB, elbow flex with TB, supine AAROM for shoulder flexion    Consulted and Agree with Plan of Care Patient             Patient will benefit from skilled therapeutic intervention in order to improve the following deficits and impairments:  Pain, Postural dysfunction, Impaired UE functional use, Impaired flexibility, Increased fascial restricitons, Decreased strength, Decreased range of motion  Visit Diagnosis: Acute pain of right shoulder  Abnormal posture  Stiffness of right shoulder, not elsewhere classified  Malignant neoplasm of 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, estrogen receptor positive ([facility]  Aftercare following surgery for neoplasm     Problem List Patient Active Problem List   Diagnosis Date Noted   Family history of breast cancer    Family history of liver cancer    Malignant neoplasm of 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, estrogen receptor positive ([place] 08/25/2020   Abnormal chest CT 01/30/2019   History of pericarditis 01/30/2019   Pericarditis 01/22/2019   Chest pain 12/26/2018   History of endometrial ablation 01/21/2018   Family history of colon cancer 09/24/2017    Poor diet 01/24/2017   Weight loss, abnormal 01/24/2017   Smoker 09/22/2016   Mixed hyperlipidemia 08/07/2016   Irregular intermenstrual bleeding 08/07/2016   Depression 08/07/2016   [place]um ([place] 08/07/2016  Pelvic congestion syndrome 08/07/2016  PHYSICAL THERAPY DISCHARGE SUMMARY  Visits from Start of Care: 9  Current functional level related to goals / functional outcomes: Unknown, pt did not return   Remaining deficits: Unknown. Pt did not return   Education / Equipment: HEP   Patient agrees to discharge. Patient goals were  not assessed secondary to not returning . Patient is being discharged due to not returning since the last visit.   Malvika Tung, PTA 11/03/2021, 4:27 PM  [facility] @ [place], [place], [address] Phone: [phone]   Fax:  [phone]  Name: Amanda Rich MRN: [id number] Date of Birth: [birth year]

## 2021-11-04 ENCOUNTER — Other Ambulatory Visit: Payer: Medicaid Other

## 2021-11-04 ENCOUNTER — Ambulatory Visit: Payer: Medicaid Other | Admitting: Physical Therapy

## 2021-11-04 ENCOUNTER — Other Ambulatory Visit: Payer: Self-pay

## 2021-11-04 DIAGNOSIS — Z111 Encounter for screening for respiratory tuberculosis: Secondary | ICD-10-CM

## 2021-11-07 ENCOUNTER — Encounter: Payer: Self-pay | Admitting: Obstetrics and Gynecology

## 2021-11-07 ENCOUNTER — Ambulatory Visit: Payer: Medicaid Other

## 2021-11-07 ENCOUNTER — Other Ambulatory Visit (HOSPITAL_COMMUNITY)
Admission: RE | Admit: 2021-11-07 | Discharge: 2021-11-07 | Disposition: A | Discharge status: Home / Self Care | Payer: Medicaid Other | Source: Ambulatory Visit | Attending: Obstetrics and Gynecology | Admitting: Obstetrics and Gynecology

## 2021-11-07 ENCOUNTER — Ambulatory Visit (INDEPENDENT_AMBULATORY_CARE_PROVIDER_SITE_OTHER): Payer: Medicaid Other | Admitting: Obstetrics and Gynecology

## 2021-11-07 ENCOUNTER — Other Ambulatory Visit: Payer: Self-pay

## 2021-11-07 VITALS — BP 131/82 | HR 69 | Wt 123.0 lb

## 2021-11-07 DIAGNOSIS — Z01419 Encounter for gynecological examination (general) (routine) without abnormal findings: Secondary | ICD-10-CM

## 2021-11-07 NOTE — Progress Notes (Signed)
Subjective:     Amanda Rich is a 55 y.o. female P3 postmenopausal with BMI 20 who is here for a comprehensive physical exam. The patient reports no problems. She recently completed breast cancer treatment with chemo/radiation in 08/2021. She is scheduled to follow up with oncologist for porta cath removal followed by mammography in January. Patient is without any complaints. She denies pelvic pain or abnormal discharge. She denies urinary incontinence. She is not sexually active. She denies vasomotor symptoms  Past Medical History:  Diagnosis Date   Anemia    Cancer ([place])    breast cancer   Depression    Family history of breast cancer    Family history of liver cancer    History of radiation therapy 03/10/21-04/11/21   Right breast- Dr. Gery Pray   Pericarditis    Smoker    Umbilical hernia    Past Surgical History:  Procedure Laterality Date   ABLATION ON ENDOMETRIOSIS     BREAST LUMPECTOMY WITH RADIOACTIVE SEED AND SENTINEL LYMPH NODE BIOPSY Right 02/02/2021   Procedure: RIGHT BREAST LUMPECTOMY WITH RADIOACTIVE SEED AND RIGHT SENTINEL LYMPH NODE MAPPING;  Surgeon: [name], MD;  Location: [facility];  Service: General;  Laterality: Right;   GANGLION CYST EXCISION  2007   L wrist; [facility], [place]   IR IMAGING GUIDED PORT INSERTION  09/10/2020   TUBAL LIGATION     [facility]   UMBILICAL HERNIA REPAIR  2005   [place]   Family History  Problem Relation Age of Onset   Diabetes Mother    Hypertension Mother    Liver cancer Daughter 66   Diabetes Maternal Aunt    Breast cancer Maternal Aunt 23   Colon cancer Maternal Uncle 40       or prostate cancer   Colon cancer Cousin    Breast cancer Cousin    Autism Half-Brother    Breast cancer Other 38       maternal second cousin   Diabetes Paternal Uncle    Breast cancer Other        dx. 46s, paternal second cousin   Lung cancer Other        dx. 63s, paternal cousin once removed (father's cousin)    Social  History   Socioeconomic History   Marital status: Legally Separated    Spouse name: Not on file   Number of children: 3   Years of education: Not on file   Highest education level: Associate degree: academic program  Occupational History   Not on file  Tobacco Use   Smoking status: Some Days    Packs/day: 1.00    Years: 25.00    Pack years: 25.00    Types: Cigarettes   Smokeless tobacco: Never   Tobacco comments:    Smoking .5 ppd- 09/22/21  Vaping Use   Vaping Use: Never used  Substance and Sexual Activity   Alcohol use: Yes    Alcohol/week: 1.0 standard drink    Types: 1 Glasses of wine per week   Drug use: No   Sexual activity: Yes    Birth control/protection: Post-menopausal  Other Topics Concern   Not on file  Social History Narrative   Not on file   Social Determinants of Health   Financial Resource Strain: Not on file  Food Insecurity: Not on file  Transportation Needs: Not on file  Physical Activity: Not on file  Stress: Not on file  Social Connections: Not on file  Intimate Partner  Violence: Not on file   Health Maintenance  Topic Date Due   Pneumococcal Vaccine 48-70 Years old (1 - PCV) Never done   Zoster Vaccines- Shingrix (1 of 2) Never done   PAP SMEAR-Modifier  09/23/2019   MAMMOGRAM  01/03/2020   COVID-19 Vaccine (4 - Booster for Pfizer series) 08/23/2021   INFLUENZA VACCINE  03/24/2022 (Originally 07/25/2021)   TETANUS/TDAP  09/22/2026   COLONOSCOPY (Pts 45-14yrs Insurance coverage will need to be confirmed)  03/29/2028   Hepatitis C Screening  Completed   HIV Screening  Completed   HPV VACCINES  Aged Out       Review of Systems Pertinent items noted in HPI and remainder of comprehensive ROS otherwise negative.   Objective:    GENERAL: Well-developed, well-nourished female in no acute distress.  HEENT: Normocephalic, atraumatic. Sclerae anicteric.  NECK: Supple. Normal thyroid.  LUNGS: Clear to auscultation bilaterally.  HEART:  Regular rate and rhythm. BREASTS: Symmetric in size. No palpable masses or lymphadenopathy, skin changes, or nipple drainage. ABDOMEN: Soft, nontender, nondistended. No organomegaly. PELVIC: Normal external female genitalia. Vagina is pink and rugated.  Normal discharge. Normal appearing cervix. Uterus is normal in size. No adnexal mass or tenderness. Chaperone present during the pelvic exam EXTREMITIES: No cyanosis, clubbing, or edema, 2+ distal pulses.     Assessment:    Healthy female exam.      Plan:    Pap smear collected Screening mammogram to be ordered post porta cath removal by oncologist Colonoscopy up to date Patient will be contacted with abnormal results See After Visit Summary for Counseling Recommendations

## 2021-11-07 NOTE — Progress Notes (Signed)
NEW GYN presents for AEX/PAP.  She just finished her last breast Cancer Treatment in Sept.  Last Mammogram was Aug./2022.

## 2021-11-10 ENCOUNTER — Encounter: Payer: Medicaid Other | Admitting: Physical Therapy

## 2021-11-11 ENCOUNTER — Telehealth: Payer: Self-pay

## 2021-11-11 LAB — CYTOLOGY - PAP
Comment: NEGATIVE
Diagnosis: NEGATIVE
High risk HPV: NEGATIVE

## 2021-11-11 LAB — QUANTIFERON-TB GOLD PLUS
QuantiFERON Mitogen Value: >10 IU/mL
QuantiFERON Nil Value: 0 IU/mL
QuantiFERON TB1 Ag Value: 0 IU/mL
QuantiFERON TB2 Ag Value: 0.02 IU/mL
QuantiFERON-TB Gold Plus: NEGATIVE

## 2021-11-11 NOTE — Telephone Encounter (Signed)
Pt. Called stating that when we get her TB lab results back she needs someone to call her and print off a copy for her to pick because she needs to be able to start her job as soon as possible. I checked with Verdis Frederickson and she said it should be back by Sunday 11/13/21 they are on a 9 day delay for TB blood tests.

## 2021-11-11 NOTE — Telephone Encounter (Signed)
Verdis Frederickson made me aware that pts. TB results came back. I have a printed copy for her to pick up on Monday morning.

## 2021-11-14 ENCOUNTER — Other Ambulatory Visit: Payer: Self-pay

## 2021-11-14 ENCOUNTER — Other Ambulatory Visit (HOSPITAL_COMMUNITY): Payer: Self-pay

## 2021-11-14 ENCOUNTER — Inpatient Hospital Stay (HOSPITAL_BASED_OUTPATIENT_CLINIC_OR_DEPARTMENT_OTHER): Payer: Medicaid Other | Admitting: Internal Medicine

## 2021-11-14 DIAGNOSIS — C50211 Malignant neoplasm of upper-inner quadrant of right female breast: Secondary | ICD-10-CM | POA: Diagnosis not present

## 2021-11-14 DIAGNOSIS — M25611 Stiffness of right shoulder, not elsewhere classified: Secondary | ICD-10-CM | POA: Diagnosis not present

## 2021-11-14 DIAGNOSIS — T451X5A Adverse effect of antineoplastic and immunosuppressive drugs, initial encounter: Secondary | ICD-10-CM | POA: Diagnosis not present

## 2021-11-14 DIAGNOSIS — Z79811 Long term (current) use of aromatase inhibitors: Secondary | ICD-10-CM | POA: Diagnosis not present

## 2021-11-14 DIAGNOSIS — R293 Abnormal posture: Secondary | ICD-10-CM | POA: Diagnosis not present

## 2021-11-14 DIAGNOSIS — Z923 Personal history of irradiation: Secondary | ICD-10-CM | POA: Diagnosis not present

## 2021-11-14 DIAGNOSIS — Z483 Aftercare following surgery for neoplasm: Secondary | ICD-10-CM | POA: Diagnosis not present

## 2021-11-14 DIAGNOSIS — G620 Drug-induced polyneuropathy: Secondary | ICD-10-CM | POA: Diagnosis not present

## 2021-11-14 DIAGNOSIS — M25511 Pain in right shoulder: Secondary | ICD-10-CM | POA: Diagnosis not present

## 2021-11-14 DIAGNOSIS — Z170 Estrogen receptor positive status [ER+]: Secondary | ICD-10-CM | POA: Diagnosis not present

## 2021-11-14 DIAGNOSIS — F17210 Nicotine dependence, cigarettes, uncomplicated: Secondary | ICD-10-CM | POA: Diagnosis not present

## 2021-11-14 MED ORDER — BACLOFEN 10 MG PO TABS
10.0000 mg | ORAL_TABLET | Freq: Three times a day (TID) | ORAL | 0 refills | Status: DC | PRN
  Filled 2021-11-14 – 2021-11-23 (×2): qty 60, 20d supply, fill #0

## 2021-11-14 MED ORDER — GABAPENTIN 300 MG PO CAPS: ORAL_CAPSULE | Freq: Every day | ORAL | 3 refills | Status: DC

## 2021-11-14 NOTE — Progress Notes (Signed)
[facility] [place], [place] [address] [phone]   New Patient Evaluation  Date of Service: 11/14/21 Patient Name: Amanda Rich Patient MRN: [id number] Patient DOB: [birth year] Provider: Ventura Sellers, MD  Identifying Statement:  Amanda Rich is a 55 y.o. female with Chemotherapy-induced neuropathy [facility]) who presents for initial consultation and evaluation regarding cancer associated neurologic deficits.    Referring Provider: Girtha Rm, PA-C No address on file  Primary Cancer:  Oncologic History: Oncology History  Malignant neoplasm of 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, estrogen receptor positive ([place])  08/25/2020 Initial Diagnosis   Patient palpated a right breast lump x49yrand a left breast lump x241yrDiagnostic mammogram and USKoreahowed in the right breast, a 2.7cm mass 5cm from the nipple and 0.8cm mass 1cm from the nipple at the 12:30 position, with borderline cortical thickening in the right axilla, and in the left breast, a 4.0cm mass at the 11 o'clock position representing a hamartoma. 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   09/13/2020 - 01/18/2021 Chemotherapy   [facility]        02/02/2021 Surgery   Right lumpectomy ([facility]): no residual carcinoma, 4 right axillary lymph nodes negative for carcinoma.   02/21/2021 - 08/30/2021 Chemotherapy      Patient is on Antibody Plan: BREAST TRASTUZUMAB + PERTUZUMAB Q21D     03/11/2021 - 04/11/2021 Radiation Therapy   Adjuvant radiation   09/2021 -  Anti-estrogen oral therapy   Anastrozole daily     History of Present Illness: The patient's records from the referring physician were obtained and reviewed and the patient interviewed to confirm this HPI.  Amanda Dunningresents today to review neuropathic symptoms.  She describes new onset of pain, burning, tingling affecting the feet and lower aspects of both legs.  Onset was during chemotherapy for breast cancer this past year which involved taxol based regimen.  She also describes cramping symptoms affecting her hands and feet which have accompanied the pain in recent weeks.  Has been dosing gabapentin 30071mID which has helped "a little". Currently on hormone therapy only, denies history of diabetes or alcohol abuse.  Medications: Current Outpatient Medications on File Prior to Visit  Medication Sig Dispense Refill   albuterol (VENTOLIN HFA) 108 (90 Base) MCG/ACT inhaler Inhale 2 puffs into the lungs every 6 (six) hours as needed for wheezing or shortness of breath. 8.5 g 0   anastrozole (ARIMIDEX) 1 MG tablet Take 1 tablet (1 mg total) by mouth daily. 90 tablet 3   budesonide-formoterol (SYMBICORT) 80-4.5 MCG/ACT inhaler Inhale 2 puffs into the lungs in the morning and at bedtime. 10.2 g 12   CALCIUM PO Take by mouth.     Cholecalciferol (VITAMIN D) 50 MCG (2000 UT) CAPS Take 1 capsule (2,000 Units total) by mouth daily. 30 capsule    cholecalciferol (VITAMIN D3) 25 MCG (1000 UNIT) tablet Take 2,000 Units by mouth daily.     citalopram (CELEXA) 10 MG tablet Take 1 tablet (10 mg total) by mouth daily. 90 tablet 3   gabapentin (NEURONTIN) 300 MG capsule TAKE 1 CAPSULE BY MOUTH AT BEDTIME 90 capsule 3   nicotine (NICODERM CQ - DOSED IN MG/24 HR) 7 mg/24hr patch PLACE 1 PATCH ONTO  THE SKIN DAILY. 28 patch 11   Tiotropium Bromide Monohydrate (SPIRIVA RESPIMAT) 2.5 MCG/ACT AERS Inhale 2 puffs into the lungs daily. 4 g 6   [DISCONTINUED] prochlorperazine (COMPAZINE) 10 MG tablet Take 1 tablet (10 mg total) by mouth every 6 (six) hours as needed (Nausea or vomiting). 30 tablet 1   No current facility-administered medications on file prior to visit.    Allergies:  Allergies  Allergen Reactions    Clarithromycin Swelling    Biaxin   Past Medical History:  Past Medical History:  Diagnosis Date   Anemia    Cancer ([place])    breast cancer   Depression    Family history of breast cancer    Family history of liver cancer    History of radiation therapy 03/10/21-04/11/21   Right breast- Dr. Gery Pray   Pericarditis    Smoker    Umbilical hernia    Past Surgical History:  Past Surgical History:  Procedure Laterality Date   ABLATION ON ENDOMETRIOSIS     BREAST LUMPECTOMY WITH RADIOACTIVE SEED AND SENTINEL LYMPH NODE BIOPSY Right 02/02/2021   Procedure: RIGHT BREAST LUMPECTOMY WITH RADIOACTIVE SEED AND RIGHT SENTINEL LYMPH NODE MAPPING;  Surgeon: [name], MD;  Location: [facility];  Service: General;  Laterality: Right;   GANGLION CYST EXCISION  2007   L wrist; [facility], [place]   IR IMAGING GUIDED PORT INSERTION  09/10/2020   TUBAL LIGATION     [facility]   UMBILICAL HERNIA REPAIR  2005   [place]   Social History:  Social History   Socioeconomic History   Marital status: Legally Separated    Spouse name: Not on file   Number of children: 3   Years of education: Not on file   Highest education level: Associate degree: academic program  Occupational History   Not on file  Tobacco Use   Smoking status: Some Days    Packs/day: 1.00    Years: 25.00    Pack years: 25.00    Types: Cigarettes   Smokeless tobacco: Never   Tobacco comments:    Smoking .5 ppd- 09/22/21  Vaping Use   Vaping Use: Never used  Substance and Sexual Activity   Alcohol use: Yes    Alcohol/week: 1.0 standard drink    Types: 1 Glasses of wine per week   Drug use: No   Sexual activity: Yes    Birth control/protection: Post-menopausal  Other Topics Concern   Not on file  Social History Narrative   Not on file   Social Determinants of Health   Financial Resource Strain: Not on file  Food Insecurity: Not on file  Transportation Needs: Not on file  Physical Activity: Not on file   Stress: Not on file  Social Connections: Not on file  Intimate Partner Violence: Not on file   Family History:  Family History  Problem Relation Age of Onset   Diabetes Mother    Hypertension Mother    Liver cancer Daughter 61   Diabetes Maternal Aunt    Breast cancer Maternal Aunt 87   Colon cancer Maternal Uncle 82       or prostate cancer   Colon cancer Cousin    Breast cancer Cousin    Autism Half-Brother    Breast cancer Other 50       maternal second cousin   Diabetes Paternal Uncle    Breast cancer Other        dx. 50s, paternal second cousin  Lung cancer Other        dx. 39s, paternal cousin once removed (father's cousin)    Review of Systems: Constitutional: Doesn't report fevers, chills or abnormal weight loss Eyes: Doesn't report blurriness of vision Ears, nose, mouth, throat, and face: Doesn't report sore throat Respiratory: Doesn't report cough, dyspnea or wheezes Cardiovascular: Doesn't report palpitation, chest discomfort  Gastrointestinal:  Doesn't report nausea, constipation, diarrhea GU: Doesn't report incontinence Skin: Doesn't report skin rashes Neurological: Per HPI Musculoskeletal: Doesn't report joint pain Behavioral/Psych: Doesn't report anxiety  Physical Exam: There were no vitals filed for this visit. KPS: 90. General: Alert, cooperative, pleasant, in no acute distress Head: Normal EENT: No conjunctival injection or scleral icterus.  Lungs: Resp effort normal Cardiac: Regular rate Abdomen: Non-distended abdomen Skin: No rashes cyanosis or petechiae. Extremities: No clubbing or edema  Neurologic Exam: Mental Status: Awake, alert, attentive to examiner. Oriented to self and environment. Language is fluent with intact comprehension.  Cranial Nerves: Visual acuity is grossly normal. Visual fields are full. Extra-ocular movements intact. No ptosis. Face is symmetric Motor: Tone and bulk are normal. Power is full in both arms and legs.  Reflexes are symmetric, no pathologic reflexes present.  Sensory: Intact to light touch Gait: Normal.   Labs: I have reviewed the data as listed    Component Value Date/Time   NA 143 10/27/2021 1447   K 3.8 10/27/2021 1447   CL 107 10/27/2021 1447   CO2 28 10/27/2021 1447   GLUCOSE 61 (L) 10/27/2021 1447   BUN 9 10/27/2021 1447   CREATININE 0.76 10/27/2021 1447   CREATININE 0.68 09/24/2017 0827   CALCIUM 9.2 10/27/2021 1447   PROT 7.3 10/27/2021 1447   ALBUMIN 3.9 10/27/2021 1447   AST 18 10/27/2021 1447   ALT 10 10/27/2021 1447   ALKPHOS 69 10/27/2021 1447   BILITOT 0.4 10/27/2021 1447   GFRNONAA >60 10/27/2021 1447   GFRNONAA >89 09/22/2016 1501   GFRAA >60 09/20/2020 1415   GFRAA >89 09/22/2016 1501   Lab Results  Component Value Date   WBC 5.5 10/27/2021   NEUTROABS 2.8 10/27/2021   HGB 12.4 10/27/2021   HCT 38.1 10/27/2021   MCV 88.0 10/27/2021   PLT 200 10/27/2021     Assessment/Plan Chemotherapy-induced neuropathy (HCC)  Alieah A Russey presents with clinical syndrome consistent with symmetric, length dependent, small and large fiber peripheral neuropathy.  Etiology is exposure to chemotherapy.  We reviewed pathophysiology of chemotherapy induced neuropathy, available treatments, and goals of care.  We recommended first increasing gabapentin to 630m BID if tolerated, as effective dose may not yet have been reached.    Prescribed trial of baclofen 160mTID prn for cramping, spasticity which would be uncommon association with length dependent neuropathy.  We spent twenty additional minutes teaching regarding the natural history, biology, and historical experience in the treatment of neurologic complications of cancer.   We appreciate the opportunity to participate in the care of TyAmberley She will return to clinic in 1 month for further titration of medications.  All questions were answered. The patient knows to call the clinic with any  problems, questions or concerns. No barriers to learning were detected.  The total time spent in the encounter was 40 minutes and more than 50% was on counseling and review of test results   ZaVentura SellersMD Medical Director of Neuro-Oncology [facility] [place]1/21/22 9:04 AM

## 2021-11-23 ENCOUNTER — Other Ambulatory Visit (HOSPITAL_COMMUNITY): Payer: Self-pay

## 2021-11-24 ENCOUNTER — Ambulatory Visit: Payer: Medicaid Other | Admitting: Physician Assistant

## 2021-11-24 DIAGNOSIS — Z419 Encounter for procedure for purposes other than remedying health state, unspecified: Secondary | ICD-10-CM | POA: Diagnosis not present

## 2021-11-25 ENCOUNTER — Ambulatory Visit: Payer: Medicaid Other | Admitting: Physician Assistant

## 2021-12-01 DIAGNOSIS — Z452 Encounter for adjustment and management of vascular access device: Secondary | ICD-10-CM | POA: Diagnosis not present

## 2021-12-06 ENCOUNTER — Telehealth: Payer: Self-pay | Admitting: *Deleted

## 2021-12-06 ENCOUNTER — Encounter: Payer: Medicaid Other | Admitting: Adult Health

## 2021-12-06 ENCOUNTER — Ambulatory Visit: Payer: Medicaid Other | Admitting: Internal Medicine

## 2021-12-06 NOTE — Assessment & Plan Note (Signed)
Patient palpated a right breast lump x71yrand a left breast lump x260yrDiagnostic mammogram and USKoreahowed in the right breast, a 2.7cm mass 5cm from the nipple and 0.8cm mass 1cm from the nipple at the 12:30 position, with borderline cortical thickening in the right axilla, and in the left breast, a 4.0cm mass at the 11 o'clock position representing a hamartoma. 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  Treatment plan: 1. Neoadjuvant chemotherapy with [facility] 6 cyclescompleted 1/25/2022followed by Herceptin Perjetamaintenance for 1 year completed 08/30/2021 2. Followed by breast conserving surgery if possible with sentinel lymph node study2/08/2021: Complete pathologic response, 0/4lymph nodes negative 3. Followed by adjuvant radiation therapy3/21/22-4/15/22 4.Followed by adjuvant antiestrogen therapywith letrozole started 06/28/2021, changed to anastrozole  ------------------------------------------------------------------------------------------------------------------------------------------- Current treatment:Anastrozole  Echocardiogram 09/06/2020: EF 60 to 65%  Tereza has having increased difficulty with neuropathy.  She also feels like she is in a 9090ear old body.  We were going to do the following things.  1.  She will have labs today with a CBC c-Met vitamin B12 and vitamin D level.  She has recently undergone thyroid testing that was normal. 2.  I have asked that she stop taking her anastrozole for the time being.  I reassured her that it is safe to do so. 3.  She will increase her gabapentin to 300 mg p.o. twice daily she will do this for a week or 2 to see if she is able to tolerate it and get it builds up in her system. 4.  I have sent in an order to physical therapy to see if they are able to evaluate and treat her  neuropathy. 5.  Considering the increased symptoms and burden this is having on Mylei I think she needs to see a specialist.  I have placed a referral for her to see Dr. VaMickeal Skinnero evaluate and treat.

## 2021-12-06 NOTE — Progress Notes (Signed)
Patient Care Team: Davy Pique as PCP - General (Family Medicine) Sueanne Margarita, MD as PCP - Cardiology (Cardiology) Rockwell Germany, RN as Oncology Nurse Navigator [name], RN as Oncology Nurse Navigator [name], MD as Consulting Physician (General Surgery) Nicholas Lose, MD as Consulting Physician (Hematology and Oncology) Gery Pray, MD as Consulting Physician (Radiation Oncology)  DIAGNOSIS:    ICD-10-CM   1. Malignant neoplasm of 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, estrogen receptor positive ([place])  C50.211    Z17.0       SUMMARY OF ONCOLOGIC HISTORY: Oncology History  Malignant neoplasm of 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, estrogen receptor positive ([place])  08/25/2020 Initial Diagnosis   Patient palpated a right breast lump x11yrand a left breast lump x214yrDiagnostic mammogram and USKoreahowed in the right breast, a 2.7cm mass 5cm from the nipple and 0.8cm mass 1cm from the nipple at the 12:30 position, with borderline cortical thickening in the right axilla, and in the left breast, a 4.0cm mass at the 11 o'clock position representing a hamartoma. 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   09/13/2020 - 01/18/2021 Chemotherapy   [facility]        02/02/2021 Surgery   Right lumpectomy ([facility]): no residual carcinoma, 4 right axillary lymph nodes negative for carcinoma.   02/21/2021 - 08/30/2021 Chemotherapy      Patient is on Antibody Plan: BREAST TRASTUZUMAB + PERTUZUMAB Q21D     03/11/2021 - 04/11/2021 Radiation Therapy   Adjuvant radiation   09/2021 -  Anti-estrogen oral therapy   Anastrozole daily     CHIEF COMPLIANT: Follow-up of right breast cancer  INTERVAL HISTORY: Amanda ARVANITISs a [age].o. with above-mentioned history of right  breast cancer treated with neoadjuvant chemotherapy followed lumpectomy and radiation and is currently on Herceptin and Perjeta maintenance. She presents to the clinic today for follow-up.  She has multiple complaints today including memory loss, peripheral neuropathy, decrease in range of motion of the right arm accompanied by pain and discomfort  ALLERGIES:  is allergic to clarithromycin.  MEDICATIONS:  Current Outpatient Medications  Medication Sig Dispense Refill   albuterol (VENTOLIN HFA) 108 (90 Base) MCG/ACT inhaler Inhale 2 puffs into the lungs every 6 (six) hours as needed for wheezing or shortness of breath. 8.5 g 0   anastrozole (ARIMIDEX) 1 MG tablet Take 1 tablet (1 mg total) by mouth daily. 90 tablet 3   baclofen (LIORESAL) 10 MG tablet Take 1 tablet (10 mg total) by mouth 3 (three) times daily as needed for muscle spasms. 60 each 0   budesonide-formoterol (SYMBICORT) 80-4.5 MCG/ACT inhaler Inhale 2 puffs into the lungs in the morning and at bedtime. 10.2 g 12   CALCIUM PO Take by mouth.     Cholecalciferol (VITAMIN D) 50 MCG (2000 UT) CAPS Take 1 capsule (2,000 Units total) by mouth daily. 30 capsule    cholecalciferol (VITAMIN D3) 25 MCG (1000 UNIT) tablet Take 2,000 Units by mouth daily.     citalopram (CELEXA) 10 MG tablet Take 1 tablet (10 mg total) by mouth daily. 90 tablet 3   gabapentin (NEURONTIN) 300 MG capsule TAKE 1 CAPSULE BY MOUTH AT BEDTIME 90 capsule 3   nicotine (NICODERM CQ - DOSED IN MG/24 HR) 7 mg/24hr  patch PLACE 1 PATCH ONTO THE SKIN DAILY. 28 patch 11   Tiotropium Bromide Monohydrate (SPIRIVA RESPIMAT) 2.5 MCG/ACT AERS Inhale 2 puffs into the lungs daily. 4 g 6   No current facility-administered medications for this visit.    PHYSICAL EXAMINATION: ECOG PERFORMANCE STATUS: 1 - Symptomatic but completely ambulatory  Vitals:   12/07/21 1038  BP: 128/75  Pulse: 77  Resp: 18  Temp: 97.7 F (36.5 C)  SpO2: 98%   Filed Weights   12/07/21 1038  Weight:  123 lb 1.6 oz (55.8 kg)    BREAST: No palpable masses or nodules in either right or left breasts. No palpable axillary supraclavicular or infraclavicular adenopathy no breast tenderness or nipple discharge. (exam performed in the presence of a chaperone)  LABORATORY DATA:  I have reviewed the data as listed CMP Latest Ref Rng & Units 10/27/2021 08/08/2021 06/28/2021  Glucose 70 - 99 mg/dL 61(L) 103(H) 92  BUN 6 - 20 mg/dL 9 11 9   Creatinine 0.44 - 1.00 mg/dL 0.76 0.74 0.72  Sodium 135 - 145 mmol/L 143 141 142  Potassium 3.5 - 5.1 mmol/L 3.8 3.7 3.7  Chloride 98 - 111 mmol/L 107 106 108  CO2 22 - 32 mmol/L 28 26 27   Calcium 8.9 - 10.3 mg/dL 9.2 9.5 9.1  Total Protein 6.5 - 8.1 g/dL 7.3 7.1 6.8  Total Bilirubin 0.3 - 1.2 mg/dL 0.4 0.4 0.3  Alkaline Phos 38 - 126 U/L 69 70 77  AST 15 - 41 U/L 18 18 20   ALT 0 - 44 U/L 10 14 16     Lab Results  Component Value Date   WBC 5.5 10/27/2021   HGB 12.4 10/27/2021   HCT 38.1 10/27/2021   MCV 88.0 10/27/2021   PLT 200 10/27/2021   NEUTROABS 2.8 10/27/2021    ASSESSMENT & PLAN:  Malignant neoplasm of 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, estrogen receptor positive ([place]) Patient palpated a right breast lump x8yrand a left breast lump x258yrDiagnostic mammogram and USKoreahowed in the right breast, a 2.7cm mass 5cm from the nipple and 0.8cm mass 1cm from the nipple at the 12:30 position, with borderline cortical thickening in the right axilla, and in the left breast, a 4.0cm mass at the 11 o'clock position representing a hamartoma. 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   Treatment plan: 1. Neoadjuvant chemotherapy with [facility] Perjeta 6 cycles completed 01/18/2021 followed by Herceptin Perjeta maintenance for 1 year completed 08/30/2021 2. Followed by breast conserving surgery if possible with  sentinel lymph node study 02/02/2021: Complete pathologic response, 0/4 lymph nodes negative 3. Followed by adjuvant radiation therapy 03/14/21- 04/08/21 4.  Followed by adjuvant antiestrogen therapy with letrozole started 06/28/2021, changed to anastrozole  ------------------------------------------------------------------------------------------------------------------------------------------- Current treatment: Anastrozole  Echocardiogram 09/06/2020: EF 60 to 65%   Major depression: Currently on Celexa, I recommended that she see behavioral health practitioner.  She will try to make an appointment.  She cries most of the time and is quite depressed.  This makes it impossible to work. Severe limitation of range of motion of the right arm: In spite of physical therapy she has not been able to use her right arm very well.  She cannot lift objects and has pain and discomfort. Severe peripheral neuropathy: Both upper and lower extremities, currently on gabapentin.  Because  of the neuropathy she is unable to work. Memory loss: Patient has significant chemo brain. I support her decision to apply for disability.  Return to clinic in 6 months for follow-up and after that we can see her annually.    No orders of the defined types were placed in this encounter.  The patient has a good understanding of the overall plan. she agrees with it. she will call with any problems that may develop before the next visit here.  Total time spent: 30 mins including face to face time and time spent for planning, charting and coordination of care  Rulon Eisenmenger, MD, MPH 12/07/2021  I, Thana Ates, am acting as scribe for Dr. Nicholas Lose.  I have reviewed the above documentation for accuracy and completeness, and I agree with the above.

## 2021-12-07 ENCOUNTER — Other Ambulatory Visit: Payer: Self-pay

## 2021-12-07 ENCOUNTER — Inpatient Hospital Stay: Payer: Medicaid Other | Attending: Hematology and Oncology | Admitting: Hematology and Oncology

## 2021-12-07 DIAGNOSIS — Z170 Estrogen receptor positive status [ER+]: Secondary | ICD-10-CM | POA: Diagnosis not present

## 2021-12-07 DIAGNOSIS — C50211 Malignant neoplasm of upper-inner quadrant of right female breast: Secondary | ICD-10-CM | POA: Insufficient documentation

## 2021-12-07 DIAGNOSIS — Z79811 Long term (current) use of aromatase inhibitors: Secondary | ICD-10-CM | POA: Insufficient documentation

## 2021-12-07 MED ORDER — GABAPENTIN 300 MG PO CAPS: 600.0000 mg | ORAL_CAPSULE | Freq: Every day | ORAL | 3 refills | Status: DC

## 2021-12-08 ENCOUNTER — Inpatient Hospital Stay (HOSPITAL_BASED_OUTPATIENT_CLINIC_OR_DEPARTMENT_OTHER): Payer: Medicaid Other | Admitting: Internal Medicine

## 2021-12-08 ENCOUNTER — Inpatient Hospital Stay (HOSPITAL_BASED_OUTPATIENT_CLINIC_OR_DEPARTMENT_OTHER): Payer: Medicaid Other

## 2021-12-08 VITALS — BP 123/79 | HR 66 | Temp 97.9°F | Resp 20 | Wt 123.1 lb

## 2021-12-08 DIAGNOSIS — T451X5A Adverse effect of antineoplastic and immunosuppressive drugs, initial encounter: Secondary | ICD-10-CM | POA: Diagnosis not present

## 2021-12-08 DIAGNOSIS — G620 Drug-induced polyneuropathy: Secondary | ICD-10-CM | POA: Diagnosis not present

## 2021-12-08 DIAGNOSIS — Z23 Encounter for immunization: Secondary | ICD-10-CM

## 2021-12-08 DIAGNOSIS — C50211 Malignant neoplasm of upper-inner quadrant of right female breast: Secondary | ICD-10-CM | POA: Diagnosis not present

## 2021-12-08 DIAGNOSIS — Z79811 Long term (current) use of aromatase inhibitors: Secondary | ICD-10-CM | POA: Diagnosis not present

## 2021-12-08 DIAGNOSIS — Z170 Estrogen receptor positive status [ER+]: Secondary | ICD-10-CM

## 2021-12-08 DIAGNOSIS — M62838 Other muscle spasm: Secondary | ICD-10-CM | POA: Diagnosis not present

## 2021-12-08 NOTE — Progress Notes (Signed)
[facility] [place], [place] [address] [phone]   Interval Evaluation  Date of Service: 12/08/21 Patient Name: Amanda Rich Patient MRN: [id number] Patient DOB: August 30, 1966 Provider: Ventura Sellers, MD  Identifying Statement:  Amanda Rich is a 55 y.o. female with Chemotherapy-induced neuropathy ([place])  Malignant neoplasm of 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, estrogen receptor positive ([place]) - Plan: MR BRAIN W WO CONTRAST  Muscle spasm of right leg - Plan: MR BRAIN W WO CONTRAST   Primary Cancer:  Oncologic History: Oncology History  Malignant neoplasm of 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, estrogen receptor positive ([place])  08/25/2020 Initial Diagnosis   Patient palpated a right breast lump x28yrand a left breast lump x268yrDiagnostic mammogram and US[place]oreahowed in the right breast, a 2.7cm mass 5cm from the nipple and 0.8cm mass 1cm from the nipple at the 12:30 position, with borderline cortical thickening in the right axilla, and in the left breast, a 4.0cm mass at the 11 o'clock position representing a hamartoma. 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   09/13/2020 - 01/18/2021 Chemotherapy   [facility]        02/02/2021 Surgery   Right lumpectomy ([facility]): no residual carcinoma, 4 right axillary lymph nodes negative for carcinoma.   02/21/2021 - 08/30/2021 Chemotherapy      Patient is on Antibody Plan: BREAST TRASTUZUMAB + PERTUZUMAB Q21D     03/11/2021 - 04/11/2021 Radiation Therapy   Adjuvant radiation   09/2021 -  Anti-estrogen oral therapy   Anastrozole daily     Interval History: Amanda MISHKINresents today for follow up.  She describes modest improvement in neuropathic pain symptoms with the higher  dose of gabapentin.  She often only takes the evening dose because of sedation and dizziness.  Baclofen has helped the spasm episodes affecting her right arm/hand, but they are still occurring almost daily with the leg.  There is a shaking and stiffening of the entire leg, which lasts for minutes.  No clear weakness after episode is over, but they are causing significant distress for her.  H+P (presents today to review neuropathic symptoms.  She describes new onset of pain, burning, tingling affecting the feet and lower aspects of both legs.  Onset was during chemotherapy for breast cancer this past year which involved taxol based regimen.  She also describes cramping symptoms affecting her hands and feet which have accompanied the pain in recent weeks.  Has been dosing gabapentin 30062mID which has helped "a little". Currently on hormone therapy only, denies history of diabetes or alcohol abuse.  Medications: Current Outpatient Medications on File Prior to Visit  Medication Sig Dispense Refill   albuterol (VENTOLIN HFA) 108 (90 Base) MCG/ACT inhaler Inhale 2 puffs into the lungs every 6 (six) hours as needed for wheezing or shortness of breath. 8.5 g 0   anastrozole (ARIMIDEX) 1 MG tablet Take 1 tablet (1 mg total) by mouth daily. 90 tablet 3   baclofen (LIORESAL) 10 MG tablet Take 1 tablet (10 mg total) by mouth 3 (three) times daily as needed for muscle spasms. 60 each 0   budesonide-formoterol (SYMBICORT) 80-4.5 MCG/ACT inhaler Inhale 2 puffs into the lungs in the morning and at bedtime. 10.2 g 12  CALCIUM PO Take by mouth.     Cholecalciferol (VITAMIN D) 50 MCG (2000 UT) CAPS Take 1 capsule (2,000 Units total) by mouth daily. 30 capsule    cholecalciferol (VITAMIN D3) 25 MCG (1000 UNIT) tablet Take 2,000 Units by mouth daily.     citalopram (CELEXA) 10 MG tablet Take 1 tablet (10 mg total) by mouth daily. 90 tablet 3   gabapentin (NEURONTIN) 300 MG capsule Take 2 capsules (600 mg total) by  mouth at bedtime. 90 capsule 3   nicotine (NICODERM CQ - DOSED IN MG/24 HR) 7 mg/24hr patch PLACE 1 PATCH ONTO THE SKIN DAILY. 28 patch 11   Tiotropium Bromide Monohydrate (SPIRIVA RESPIMAT) 2.5 MCG/ACT AERS Inhale 2 puffs into the lungs daily. 4 g 6   [DISCONTINUED] prochlorperazine (COMPAZINE) 10 MG tablet Take 1 tablet (10 mg total) by mouth every 6 (six) hours as needed (Nausea or vomiting). 30 tablet 1   No current facility-administered medications on file prior to visit.    Allergies:  Allergies  Allergen Reactions   Clarithromycin Swelling    Biaxin   Past Medical History:  Past Medical History:  Diagnosis Date   Anemia    Cancer ([place])    breast cancer   Depression    Family history of breast cancer    Family history of liver cancer    History of radiation therapy 03/10/21-04/11/21   Right breast- Dr. Gery Pray   Pericarditis    Smoker    Umbilical hernia    Past Surgical History:  Past Surgical History:  Procedure Laterality Date   ABLATION ON ENDOMETRIOSIS     BREAST LUMPECTOMY WITH RADIOACTIVE SEED AND SENTINEL LYMPH NODE BIOPSY Right 02/02/2021   Procedure: RIGHT BREAST LUMPECTOMY WITH RADIOACTIVE SEED AND RIGHT SENTINEL LYMPH NODE MAPPING;  Surgeon: [name], MD;  Location: [facility];  Service: General;  Laterality: Right;   GANGLION CYST EXCISION  2007   L wrist; [facility], [facility]   IR IMAGING GUIDED PORT INSERTION  09/10/2020   TUBAL LIGATION     [facility]   UMBILICAL HERNIA REPAIR  2005   [facility]   Social History:  Social History   Socioeconomic History   Marital status: Divorced    Spouse name: Not on file   Number of children: 3   Years of education: Not on file   Highest education level: Associate degree: academic program  Occupational History   Not on file  Tobacco Use   Smoking status: Some Days    Packs/day: 1.00    Years: 25.00    Pack years: 25.00    Types: Cigarettes   Smokeless tobacco: Never   Tobacco comments:    Smoking  .5 ppd- 09/22/21  Vaping Use   Vaping Use: Never used  Substance and Sexual Activity   Alcohol use: Yes    Alcohol/week: 1.0 standard drink    Types: 1 Glasses of wine per week   Drug use: No   Sexual activity: Yes    Birth control/protection: Post-menopausal  Other Topics Concern   Not on file  Social History Narrative   Not on file   Social Determinants of Health   Financial Resource Strain: Not on file  Food Insecurity: Not on file  Transportation Needs: Not on file  Physical Activity: Not on file  Stress: Not on file  Social Connections: Not on file  Intimate Partner Violence: Not on file   Family History:  Family History  Problem Relation Age of Onset  Diabetes Mother    Hypertension Mother    Liver cancer Daughter 21   Diabetes Maternal Aunt    Breast cancer Maternal Aunt 53   Colon cancer Maternal Uncle 82       or prostate cancer   Colon cancer Cousin    Breast cancer Cousin    Autism Half-Brother    Breast cancer Other 76       maternal second cousin   Diabetes Paternal Uncle    Breast cancer Other        dx. 62s, paternal second cousin   Lung cancer Other        dx. 49s, paternal cousin once removed (father's cousin)    Review of Systems: Constitutional: Doesn't report fevers, chills or abnormal weight loss Eyes: Doesn't report blurriness of vision Ears, nose, mouth, throat, and face: Doesn't report sore throat Respiratory: Doesn't report cough, dyspnea or wheezes Cardiovascular: Doesn't report palpitation, chest discomfort  Gastrointestinal:  Doesn't report nausea, constipation, diarrhea GU: Doesn't report incontinence Skin: Doesn't report skin rashes Neurological: Per HPI Musculoskeletal: Doesn't report joint pain Behavioral/Psych: Doesn't report anxiety  Physical Exam: Vitals:   12/08/21 1428  BP: 123/79  Pulse: 66  Resp: 20  Temp: 97.9 F (36.6 C)  SpO2: 100%   KPS: 90. General: Alert, cooperative, pleasant, in no acute  distress Head: Normal EENT: No conjunctival injection or scleral icterus.  Lungs: Resp effort normal Cardiac: Regular rate Abdomen: Non-distended abdomen Skin: No rashes cyanosis or petechiae. Extremities: No clubbing or edema  Neurologic Exam: Mental Status: Awake, alert, attentive to examiner. Oriented to self and environment. Language is fluent with intact comprehension.  Cranial Nerves: Visual acuity is grossly normal. Visual fields are full. Extra-ocular movements intact. No ptosis. Face is symmetric Motor: Tone and bulk are normal. Power is full in both arms and legs. Reflexes are symmetric, no pathologic reflexes present.  Sensory: Intact to light touch Gait: Normal.   Labs: I have reviewed the data as listed    Component Value Date/Time   NA 143 10/27/2021 1447   K 3.8 10/27/2021 1447   CL 107 10/27/2021 1447   CO2 28 10/27/2021 1447   GLUCOSE 61 (L) 10/27/2021 1447   BUN 9 10/27/2021 1447   CREATININE 0.76 10/27/2021 1447   CREATININE 0.68 09/24/2017 0827   CALCIUM 9.2 10/27/2021 1447   PROT 7.3 10/27/2021 1447   ALBUMIN 3.9 10/27/2021 1447   AST 18 10/27/2021 1447   ALT 10 10/27/2021 1447   ALKPHOS 69 10/27/2021 1447   BILITOT 0.4 10/27/2021 1447   GFRNONAA >60 10/27/2021 1447   GFRNONAA >89 09/22/2016 1501   GFRAA >60 09/20/2020 1415   GFRAA >89 09/22/2016 1501   Lab Results  Component Value Date   WBC 5.5 10/27/2021   NEUTROABS 2.8 10/27/2021   HGB 12.4 10/27/2021   HCT 38.1 10/27/2021   MCV 88.0 10/27/2021   PLT 200 10/27/2021     Assessment/Plan Chemotherapy-induced neuropathy (HCC)  Amanda Rich presents with clinical syndrome consistent with symmetric, length dependent, small and large fiber peripheral neuropathy.  Etiology is exposure to chemotherapy.  She may continue gabapentin to 643m BID, she is not interested in increasing medications further for this.  For episodes of right leg stiffening, shaking, given at least some suspicion  of seizure, we would like to obtain a brain MRI to rule out process involving left frontal lobe (metastasis, stroke, etc.)  We will obtain the scan promptly and call her once results are available  for review, prior to adding further medications.  We appreciate the opportunity to participate in the care of Amanda Rich.  She will return to clinic in 1 month for further titration of medications.  All questions were answered. The patient knows to call the clinic with any problems, questions or concerns. No barriers to learning were detected.  The total time spent in the encounter was 30 minutes and more than 50% was on counseling and review of test results   Ventura Sellers, MD Medical Director of Neuro-Oncology [facility] [place] 12/08/21 2:45 PM

## 2021-12-09 ENCOUNTER — Telehealth: Payer: Self-pay | Admitting: Internal Medicine

## 2021-12-09 NOTE — Telephone Encounter (Signed)
Scheduled per 12/15 los, pt has been called and confirmed appt

## 2021-12-12 ENCOUNTER — Other Ambulatory Visit: Payer: Self-pay | Admitting: Radiation Therapy

## 2021-12-12 ENCOUNTER — Ambulatory Visit: Payer: Medicaid Other | Admitting: Physician Assistant

## 2021-12-16 ENCOUNTER — Ambulatory Visit (HOSPITAL_COMMUNITY): Admission: RE | Admit: 2021-12-16 | Payer: Medicaid Other | Source: Ambulatory Visit

## 2021-12-17 ENCOUNTER — Ambulatory Visit (HOSPITAL_COMMUNITY)
Admission: RE | Admit: 2021-12-17 | Discharge: 2021-12-17 | Disposition: A | Discharge status: Home / Self Care | Payer: Medicaid Other | Source: Ambulatory Visit | Attending: Internal Medicine | Admitting: Internal Medicine

## 2021-12-17 DIAGNOSIS — C50211 Malignant neoplasm of upper-inner quadrant of right female breast: Secondary | ICD-10-CM | POA: Insufficient documentation

## 2021-12-17 DIAGNOSIS — M62838 Other muscle spasm: Secondary | ICD-10-CM | POA: Insufficient documentation

## 2021-12-17 DIAGNOSIS — Z170 Estrogen receptor positive status [ER+]: Secondary | ICD-10-CM | POA: Insufficient documentation

## 2021-12-17 DIAGNOSIS — R251 Tremor, unspecified: Secondary | ICD-10-CM | POA: Diagnosis not present

## 2021-12-17 IMAGING — MR MR HEAD WO/W CM
13 series · 48 of 48 positions shown · IV contrast (6 ML GADAVIST)
Comparison: Report of head CT [DATE] (no images available).

CLINICAL DATA: 55-year-old female with a history of breast cancer.
Right lower extremity muscle spasm, tremor. Possible seizure.
Possible chemotherapy-induced neuropathy.

EXAM:
MRI HEAD WITHOUT AND WITH CONTRAST
TECHNIQUE: Multiplanar, multiecho pulse sequences of the brain and surrounding
structures were obtained without and with intravenous contrast.
CONTRAST:  6mL GADAVIST GADOBUTROL 1 MMOL/ML IV SOLN

[Series 5: DWI · axial · 3.0mm · 1.36mm/px · z∈[-48,+93]mm · 5 of 96 slices shown (1 of 2)]
[im 1/96]
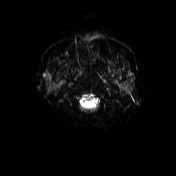
[im 24/96]
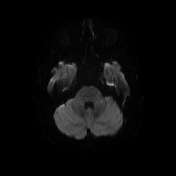
[im 48/96]
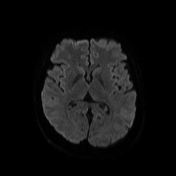
[im 72/96]
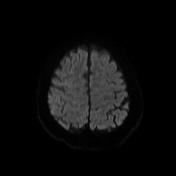
[im 96/96]
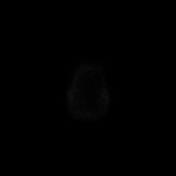

[Series 6: DWI · axial · 3.0mm · 1.36mm/px · z∈[-48,+93]mm · 2 of 47 slices shown (2 of 2)]
[im 1/47]
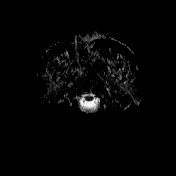
[im 47/47]
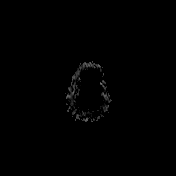

[Series 7: T1 · sagittal · 5.0mm · 0.75mm/px · 2 of 24 slices shown (1 of 2)]
[im 1/24]
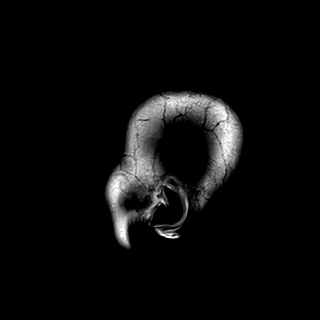
[im 24/24]
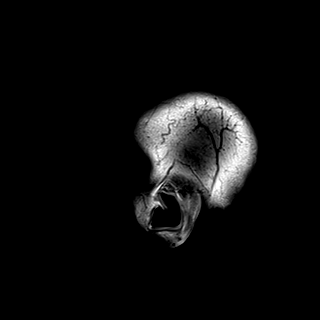

[Series 8: T2 · axial · 5.0mm · 0.62mm/px · z∈[-59,+89]mm · 2 of 24 slices shown]
[im 1/24]
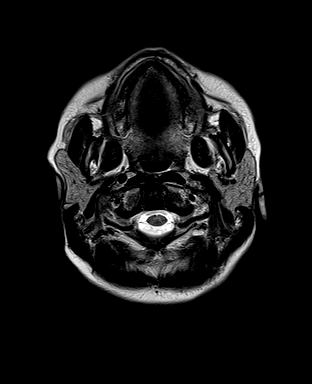
[im 24/24]
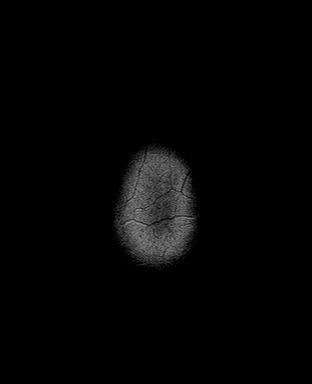

[Series 9: swi_images · axial · 3.0mm · 0.75mm/px · z∈[-91,+121]mm · 5 of 72 slices shown]
[im 1/72]
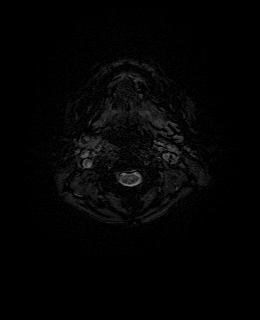
[im 18/72]
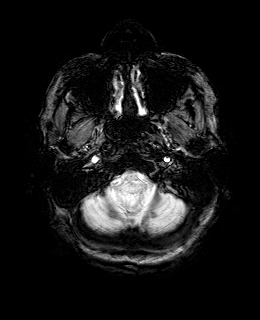
[im 36/72]
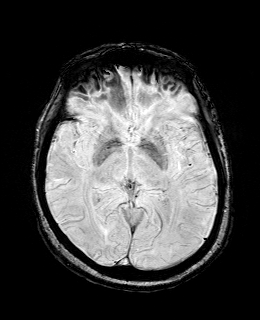
[im 54/72]
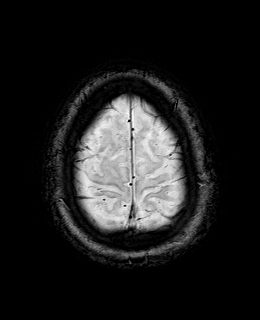
[im 72/72]
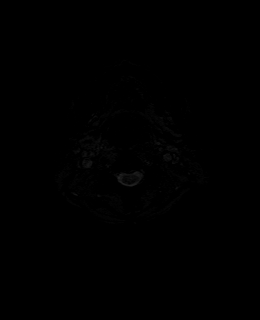

[Series 11: FLAIR · axial · 3.0mm · 0.75mm/px · z∈[-61,+91]mm · 3 of 52 slices shown]
[im 1/52]
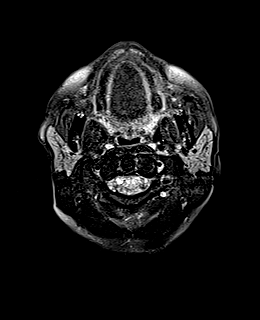
[im 26/52]
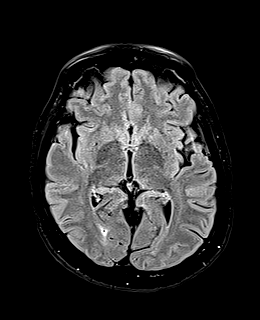
[im 52/52]
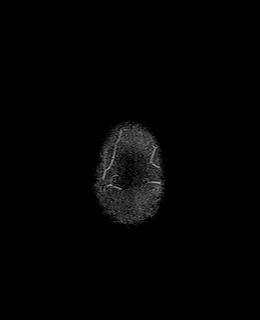

[Series 12: T1 · axial · 1.0mm · 0.94mm/px · z∈[-49,+93]mm · 9 of 144 slices shown (2 of 2)]
[im 1/144]
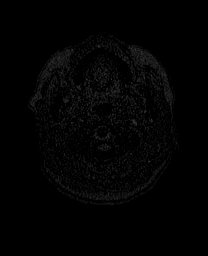
[im 18/144]
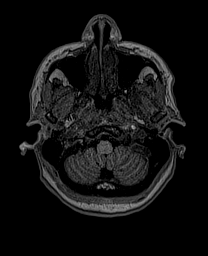
[im 36/144]
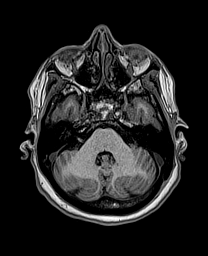
[im 54/144]
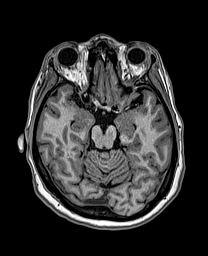
[im 72/144]
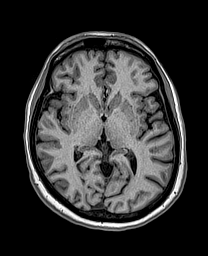
[im 90/144]
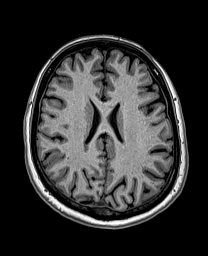
[im 108/144]
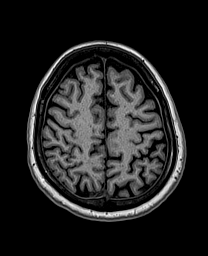
[im 126/144]
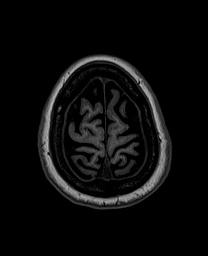
[im 144/144]
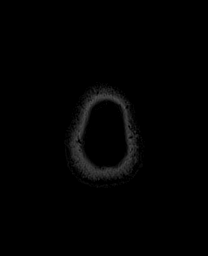

[Series 13: cor dwi_tracew · coronal · 5.0mm · 1.53mm/px · 3 of 50 slices shown]
[im 1/50]
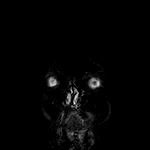
[im 25/50]
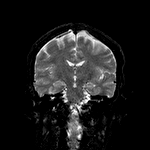
[im 50/50]
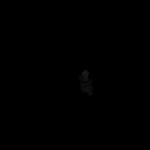

[Series 14: cor dwi_adc · coronal · 5.0mm · 1.53mm/px · 2 of 25 slices shown]
[im 1/25]
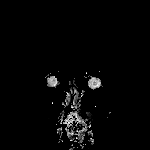
[im 25/25]
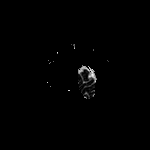

[Series 15: T2 post-contrast · coronal · 5.0mm · 0.57mm/px · 2 of 26 slices shown]
[im 1/26]
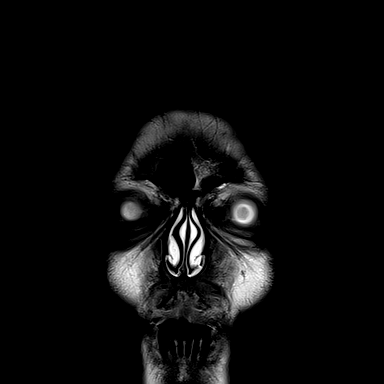
[im 26/26]
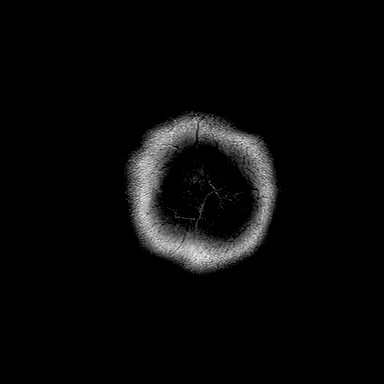

[Series 16: T1 post-contrast · axial · 1.0mm · 0.94mm/px · z∈[-49,+93]mm · 9 of 144 slices shown (1 of 3)]
[im 1/144]
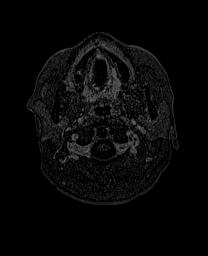
[im 18/144]
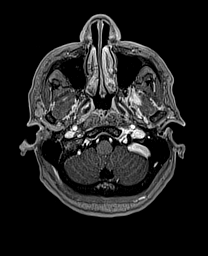
[im 36/144]
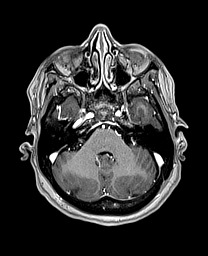
[im 54/144]
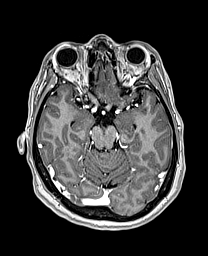
[im 72/144]
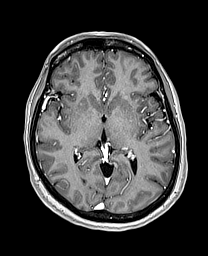
[im 90/144]
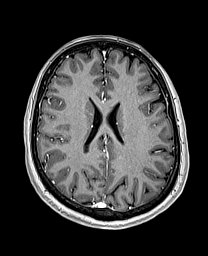
[im 108/144]
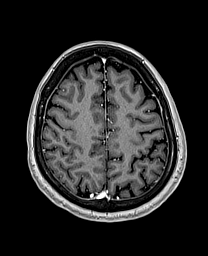
[im 126/144]
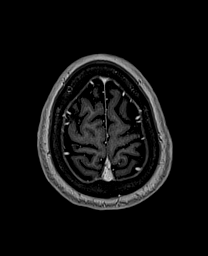
[im 144/144]
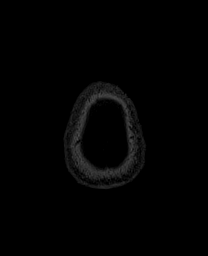

[Series 17: T1 post-contrast · coronal · 5.0mm · 0.43mm/px · 2 of 26 slices shown (2 of 3)]
[im 1/26]
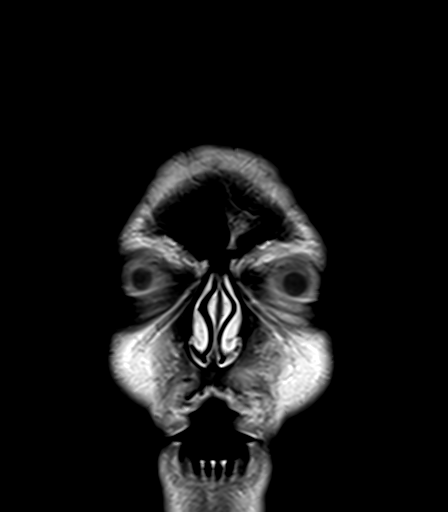
[im 26/26]
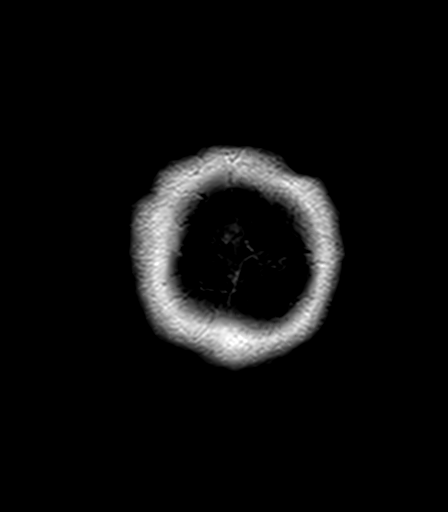

[Series 18: T1 post-contrast · sagittal · 5.0mm · 0.75mm/px · 2 of 24 slices shown (3 of 3)]
[im 1/24]
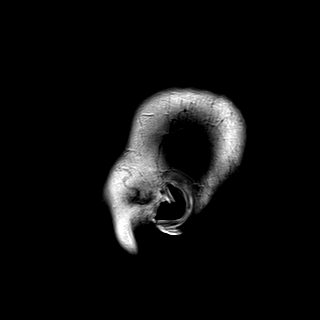
[im 24/24]
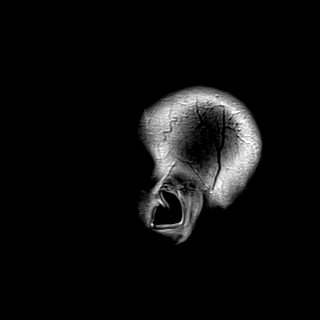

[48 of 48 positions shown; findings below may reference images not displayed]

FINDINGS: Brain: Normal cerebral volume. No restricted diffusion to suggest
acute infarction. No midline shift, mass effect, evidence of mass
lesion, ventriculomegaly, extra-axial collection or acute
intracranial hemorrhage. Cervicomedullary junction and pituitary are
within normal limits.

Largely normal for age gray-white and white matter signal throughout
the brain. Minimal nonspecific white matter T2 and FLAIR
hyperintensity which is mostly subcortical (series 11, image 36). On
routine coronal imaging the hippocampal formations and mesial
temporal lobe structures appear symmetric and within normal limits.

No cortical encephalomalacia or chronic cerebral blood products
identified.

No abnormal gray or white matter enhancement identified. No dural
thickening.

Vascular: Major intracranial vascular flow voids are preserved. The
major dural venous sinuses are enhancing and appear to be patent.

However, small abnormal saccular flow void and enhancing outpouching
from the supraclinoid left ICA directed medially into the
suprasellar cistern appears consistent with a roughly 5 mm saccular
aneurysm of the distal right ICA. See series 16, image 51.

Skull and upper cervical spine: Negative visible cervical spine and
spinal cord. Visualized bone marrow signal is within normal limits.

Sinuses/Orbits: Negative orbits. Paranasal sinuses and mastoids are
well aerated.

Other: Grossly normal visible internal auditory structures. Negative
visible scalp and face.
IMPRESSION: 1. Strong suspicion of a 5 mm Aneurysm of the distal Right ICA.
Noncontrast intracranial MRA or CTA Head with contrast should be
confirmatory.

2. Otherwise normal for age MRI appearance of the brain. No
metastatic disease identified.

## 2021-12-17 MED ORDER — GADOBUTROL 1 MMOL/ML IV SOLN
6.0000 mL | Freq: Once | INTRAVENOUS | Status: AC | PRN
  Administered 2021-12-17: 12:00:00 6 mL via INTRAVENOUS

## 2021-12-21 ENCOUNTER — Other Ambulatory Visit (HOSPITAL_COMMUNITY): Payer: Self-pay

## 2021-12-21 ENCOUNTER — Other Ambulatory Visit: Payer: Self-pay | Admitting: Hematology and Oncology

## 2021-12-22 ENCOUNTER — Encounter: Payer: Self-pay | Admitting: Hematology and Oncology

## 2021-12-22 ENCOUNTER — Inpatient Hospital Stay (HOSPITAL_BASED_OUTPATIENT_CLINIC_OR_DEPARTMENT_OTHER): Payer: Medicaid Other | Admitting: Internal Medicine

## 2021-12-22 ENCOUNTER — Other Ambulatory Visit (HOSPITAL_COMMUNITY): Payer: Self-pay

## 2021-12-22 ENCOUNTER — Ambulatory Visit (INDEPENDENT_AMBULATORY_CARE_PROVIDER_SITE_OTHER): Payer: Medicaid Other | Admitting: Physician Assistant

## 2021-12-22 ENCOUNTER — Other Ambulatory Visit: Payer: Self-pay

## 2021-12-22 DIAGNOSIS — I720 Aneurysm of carotid artery: Secondary | ICD-10-CM | POA: Diagnosis not present

## 2021-12-22 DIAGNOSIS — G620 Drug-induced polyneuropathy: Secondary | ICD-10-CM | POA: Diagnosis not present

## 2021-12-22 DIAGNOSIS — G8929 Other chronic pain: Secondary | ICD-10-CM

## 2021-12-22 DIAGNOSIS — T451X5A Adverse effect of antineoplastic and immunosuppressive drugs, initial encounter: Secondary | ICD-10-CM

## 2021-12-22 DIAGNOSIS — M25511 Pain in right shoulder: Secondary | ICD-10-CM

## 2021-12-22 MED ORDER — GABAPENTIN 300 MG PO CAPS
300.0000 mg | ORAL_CAPSULE | Freq: Three times a day (TID) | ORAL | 2 refills | Status: DC
  Filled 2021-12-22: qty 180, 60d supply, fill #0

## 2021-12-22 NOTE — Telephone Encounter (Signed)
Dr. Mickeal Skinner signed today. Gardiner Rhyme, RN

## 2021-12-22 NOTE — Progress Notes (Signed)
Office Visit Note   Patient: Amanda Rich           Date of Birth: [birth year]           MRN: [id number] Visit Date: 12/22/2021              Requested by: Girtha Rm, PA-C No address on file PCP: Girtha Rm, PA-C  Chief Complaint  Patient presents with   Right Knee - Pain   Left Knee - Pain   Right Shoulder - Pain      HPI: Patient is a pleasant 55 year old woman who is here in follow-up for her right shoulder.  She has a history of right breast cancer and lymph node dissection on her right axilla.  Since then she has had increasing pain and stiffness in her right shoulder joint.  She points to the pain at the shoulder.  No real pain at all in the axilla.  It is also limiting her motion.  Her job requires her to use her arm strength a lot and she is right-hand dominant.  She is concerned as she has not worked in over a year.  Assessment & Plan: Visit Diagnoses: No diagnosis found.  Plan: Patient has been told that she cannot have any kind of injections in her right arm because of the lymphedema and the lymph node dissection.  I am not sure if a steroid injection into the shoulder would be something she could not have but she is very hesitant is going to check for it with her surgeon.  Have also given her suggestions about Voltaren gel.  She has not really made any progress with physical therapy.  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  I recommend an MRI and follow-up with Dr. Durward Fortes afterwards to see what her options may be.  Follow-Up Instructions: No follow-ups on file.   Ortho Exam  Patient is alert, oriented, no adenopathy, well-dressed, normal affect, normal respiratory effort. Examination she is a pleasant thin woman very appropriate and comfortable to exam.  Her right arm she has decreased abduction.  She can do forward elevation almost equivalent to the left side however it does get painful.  She has positive pain with  empty can testing.  Also pain with impingement testing.  She does not have any pain in her axilla it is at the the [facility] joint and in the shoulder.  Imaging: No results found. No images are attached to the encounter.  Labs: Lab Results  Component Value Date   HGBA1C 4.9 12/26/2018   HGBA1C 4.8 09/22/2016   ESRSEDRATE 1 12/26/2018   REPTSTATUS 11/04/2020 FINAL 10/30/2020   CULT  10/30/2020    NO GROWTH 5 DAYS Performed at [facility], [place] [address] [place], [place] [address]      Lab Results  Component Value Date   ALBUMIN 3.9 10/27/2021   ALBUMIN 3.8 08/08/2021   ALBUMIN 3.6 06/28/2021    No results found for: MG Lab Results  Component Value Date   VD25OH 37.61 10/27/2021    No results found for: PREALBUMIN CBC EXTENDED Latest Ref Rng & Units 10/27/2021 08/08/2021 06/28/2021  WBC 4.0 - 10.5 K/uL 5.5 6.0 4.5  RBC 3.87 - 5.11 MIL/uL 4.33 4.20 4.09  HGB 12.0 - 15.0 g/dL 12.4 12.0 11.9(L)  HCT 36.0 - 46.0 % 38.1 36.2 35.5(L)  PLT 150 - 400 K/uL 200 195 194  NEUTROABS 1.7 - 7.7 K/uL 2.8  3.0 1.9  LYMPHSABS 0.7 - 4.0 K/uL 2.0 2.5 1.9     There is no height or weight on file to calculate BMI.  Orders:  No orders of the defined types were placed in this encounter.  No orders of the defined types were placed in this encounter.    Procedures: No procedures performed  Clinical Data: No additional findings.  ROS:  All other systems negative, except as noted in the HPI. Review of Systems  Objective: Vital Signs: LMP 10/15/2017 (Approximate)   Specialty Comments:  No specialty comments available.  PMFS History: Patient Active Problem List   Diagnosis Date Noted   Muscle spasm of right leg 12/08/2021   Chemotherapy-induced neuropathy ([place]) 11/14/2021   Family history of breast cancer    Family history of liver cancer    Malignant neoplasm of 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, estrogen receptor positive ([place]) 08/25/2020   Abnormal chest CT  01/30/2019   History of pericarditis 01/30/2019   Pericarditis 01/22/2019   Chest pain 12/26/2018   History of endometrial ablation 01/21/2018   Family history of colon cancer 09/24/2017   Poor diet 01/24/2017   Weight loss, abnormal 01/24/2017   Smoker 09/22/2016   Mixed hyperlipidemia 08/07/2016   Irregular intermenstrual bleeding 08/07/2016   Depression 08/07/2016   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 Gery Pray   Pericarditis    Smoker    Umbilical hernia     Family History  Problem Relation Age of Onset   Diabetes Mother    Hypertension Mother    Liver cancer Daughter 50   Diabetes Maternal Aunt    Breast cancer Maternal Aunt 66   Colon cancer Maternal Uncle 51       or prostate cancer   Colon cancer Cousin    Breast cancer Cousin    Autism Half-Brother    Breast cancer Other 32       maternal second cousin   Diabetes Paternal Uncle    Breast cancer Other        dx. 42s, paternal second cousin   Lung cancer Other        dx. 74s, paternal cousin once removed (father's cousin)    Past Surgical History:  Procedure Laterality Date   ABLATION ON ENDOMETRIOSIS     BREAST LUMPECTOMY WITH RADIOACTIVE SEED AND SENTINEL LYMPH NODE BIOPSY Right 02/02/2021   Procedure: RIGHT BREAST LUMPECTOMY WITH RADIOACTIVE SEED AND RIGHT SENTINEL LYMPH NODE MAPPING;  Surgeon: [name], MD;  Location: [place];  Service: General;  Laterality: Right;   GANGLION CYST EXCISION  2007   L wrist; [facility], [place]   IR IMAGING GUIDED PORT INSERTION  09/10/2020   TUBAL LIGATION     [facility]   UMBILICAL HERNIA REPAIR  2005   [place]   Social History   Occupational History   Not on file  Tobacco  Use   Smoking status: Some Days    Packs/day: 1.00    Years: 25.00    Pack years: 25.00    Types: Cigarettes   Smokeless tobacco: Never   Tobacco comments:    Smoking .5 ppd- 09/22/21  Vaping Use   Vaping Use: Never used  Substance and Sexual Activity   Alcohol use: Yes    Alcohol/week: 1.0 standard drink    Types: 1 Glasses of wine per week   Drug use: No   Sexual activity: Yes    Birth control/protection: Post-menopausal

## 2021-12-22 NOTE — Progress Notes (Signed)
I connected with Amanda Rich on 12/22/21 at 10:00 AM EST by telephone visit and verified that I am speaking with the correct person using two identifiers.  I discussed the limitations, risks, security and privacy concerns of performing an evaluation and management service by telemedicine and the availability of in-person appointments. I also discussed with the patient that there may be a patient responsible charge related to this service. The patient expressed understanding and agreed to proceed.  Other persons participating in the visit and their role in the encounter:  n/a  Patient's location:  Home  Provider's location:  Office  Chief Complaint:  Chemotherapy-induced neuropathy ([place])  Carotid aneurysm, right ([place]) - Plan: CT ANGIO HEAD NECK W WO CM, Ambulatory referral to Neurosurgery  History of Present Ilness: Amanda Rich describes stability of neuropathy symptoms.  Dosing gabapentin 300mg  three times per day.  Spasms are controlled with PRN baclofen. Observations: Language and cognition at baseline  Imaging: MR BRAIN W WO CONTRAST  Result Date: 12/18/2021 CLINICAL DATA:  55 year old female with a history of breast cancer. Right lower extremity muscle spasm, tremor. Possible seizure. Possible chemotherapy-induced neuropathy. EXAM: MRI HEAD WITHOUT AND WITH CONTRAST TECHNIQUE: Multiplanar, multiecho pulse sequences of the brain and surrounding structures were obtained without and with intravenous contrast. CONTRAST:  57mL GADAVIST GADOBUTROL 1 MMOL/ML IV SOLN COMPARISON:  Report of head CT 09/28/2007 (no images available). FINDINGS: Brain: Normal cerebral volume. No restricted diffusion to suggest acute infarction. No midline shift, mass effect, evidence of mass lesion, ventriculomegaly, extra-axial collection or acute intracranial hemorrhage. Cervicomedullary junction and pituitary are within normal limits. Largely normal for age gray-white and white matter signal throughout the  brain. Minimal nonspecific white matter T2 and FLAIR hyperintensity which is mostly subcortical (series 11, image 36). On routine coronal imaging the hippocampal formations and mesial temporal lobe structures appear symmetric and within normal limits. No cortical encephalomalacia or chronic cerebral blood products identified. No abnormal gray or white matter enhancement identified. No dural thickening. Vascular: Major intracranial vascular flow voids are preserved. The major dural venous sinuses are enhancing and appear to be patent. 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 See series 16, image 51. Skull and upper cervical spine: Negative visible cervical spine and spinal cord. Visualized bone marrow signal is within normal limits. Sinuses/Orbits: Negative orbits. Paranasal sinuses and mastoids are well aerated. Other: Grossly normal visible internal auditory structures. Negative visible scalp and face. IMPRESSION: 1. 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 Noncontrast intracranial MRA or CTA Head with contrast should be confirmatory. 2. Otherwise normal for age MRI appearance of the brain. No metastatic disease identified. Electronically Signed   By: Genevie Ann M.D.   On: 12/18/2021 08:20    Assessment and Plan: Chemotherapy-induced neuropathy ([place])  Carotid aneurysm, right ([place]) - Plan: CT ANGIO HEAD NECK W WO CM, Ambulatory referral to Neurosurgery  Clinically stable.  MRI demonstrates likely 29mm carotid aneurysm, clival.   Follow Up Instructions: Recommended CT angio and consultation with Dr. Kathyrn Sheriff for neurovascular eval.  She is agreeable with this plan.  I discussed the assessment and treatment plan with the patient.  The patient was provided an opportunity to ask questions and all were  answered.  The patient agreed with the plan and demonstrated understanding of the instructions.  The patient was advised to call back or seek an in-person evaluation if the symptoms worsen or if the condition fails to improve as anticipated.  I provided 5-10 minutes of non-face-to-face time during this enocunter.  Ventura Sellers, MD   I provided 23 minutes of non face-to-face telephone visit time during this encounter, and > 50% was spent counseling as documented under my assessment & plan.

## 2021-12-25 DIAGNOSIS — Z419 Encounter for procedure for purposes other than remedying health state, unspecified: Secondary | ICD-10-CM | POA: Diagnosis not present

## 2021-12-28 ENCOUNTER — Telehealth: Payer: Self-pay | Admitting: Pulmonary Disease

## 2021-12-28 ENCOUNTER — Encounter: Payer: Self-pay | Admitting: Hematology and Oncology

## 2021-12-28 DIAGNOSIS — F17200 Nicotine dependence, unspecified, uncomplicated: Secondary | ICD-10-CM

## 2021-12-28 NOTE — Telephone Encounter (Signed)
Fax received from pt's pharmacy in regards to pt's nicotine 7mg /24hr patches. Pt would like authorization for 3 months at a time as her insurance is changing.  Dr. Silas Flood, please advise if you are okay with [place] sending pt's nicotine patch Rx to pharmacy as a 35-month supply.

## 2021-12-28 NOTE — Telephone Encounter (Signed)
Yes, 3 month supply ok.

## 2021-12-29 ENCOUNTER — Other Ambulatory Visit (HOSPITAL_COMMUNITY): Payer: Self-pay

## 2021-12-29 ENCOUNTER — Ambulatory Visit (HOSPITAL_COMMUNITY): Admission: RE | Admit: 2021-12-29 | Payer: Medicaid Other | Source: Ambulatory Visit

## 2021-12-29 MED ORDER — NICOTINE 7 MG/24HR TD PT24
MEDICATED_PATCH | TRANSDERMAL | 0 refills | Status: DC
  Filled 2021-12-30: qty 28, 28d supply, fill #0

## 2021-12-29 NOTE — Telephone Encounter (Signed)
90 day supply for Nicotine patches have been sent to [facility].

## 2021-12-30 ENCOUNTER — Other Ambulatory Visit (HOSPITAL_COMMUNITY): Payer: Self-pay

## 2021-12-31 ENCOUNTER — Ambulatory Visit (HOSPITAL_COMMUNITY)
Admission: RE | Admit: 2021-12-31 | Discharge: 2021-12-31 | Disposition: A | Discharge status: Home / Self Care | Payer: Medicaid Other | Source: Ambulatory Visit | Attending: Physician Assistant | Admitting: Physician Assistant

## 2021-12-31 DIAGNOSIS — M25511 Pain in right shoulder: Secondary | ICD-10-CM | POA: Insufficient documentation

## 2021-12-31 DIAGNOSIS — G8929 Other chronic pain: Secondary | ICD-10-CM | POA: Diagnosis not present

## 2021-12-31 IMAGING — MR MR SHOULDER*R* W/O CM
5 series · 40 of 40 positions shown · non-contrast
Comparison: None.

CLINICAL DATA: Right shoulder pain for 1 year.

EXAM:
MRI OF THE RIGHT SHOULDER WITHOUT CONTRAST
TECHNIQUE: Multiplanar, multisequence MR imaging of the shoulder was performed.
No intravenous contrast was administered.

[Series 8: T2 fat-sat · oblique · right · 4.0mm · 0.50mm/px · 8 of 20 slices shown (1 of 3)]
[im 1/20]
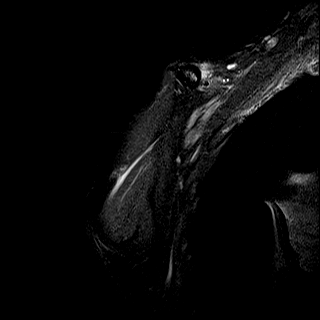
[im 3/20]
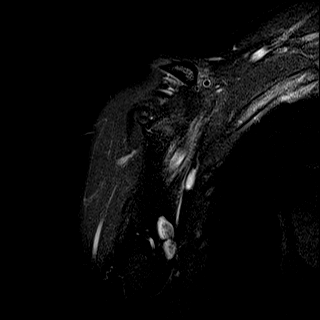
[im 6/20]
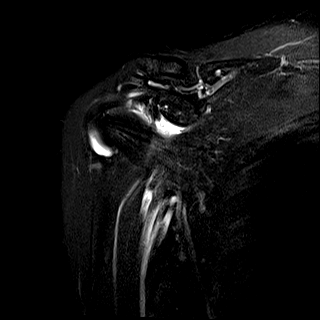
[im 9/20]
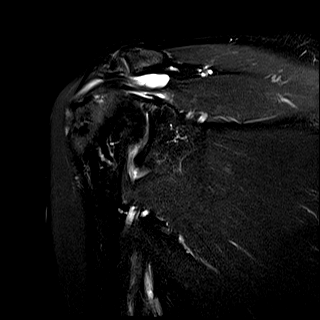
[im 11/20]
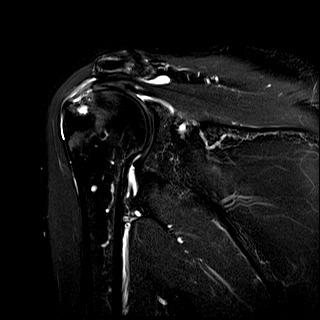
[im 14/20]
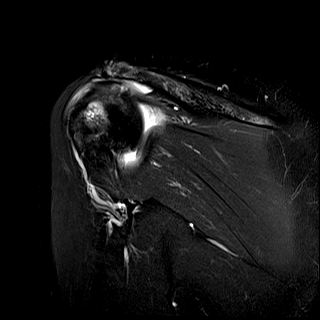
[im 17/20]
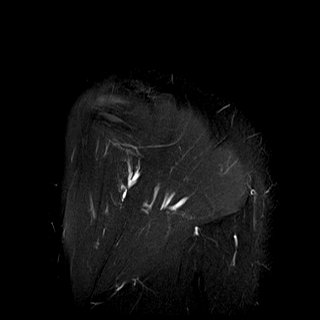
[im 20/20]
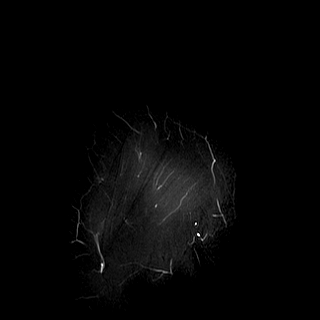

[Series 9: PD · oblique · right · 4.0mm · 0.50mm/px · 7 of 20 slices shown]
[im 1/20]
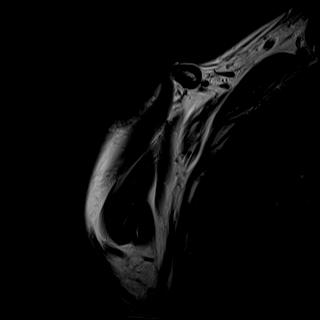
[im 4/20]
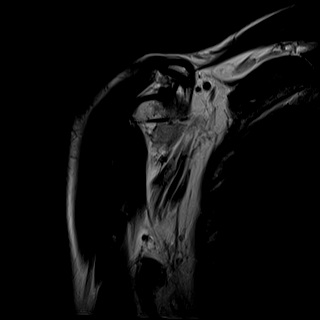
[im 7/20]
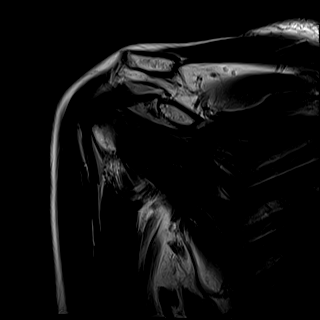
[im 10/20]
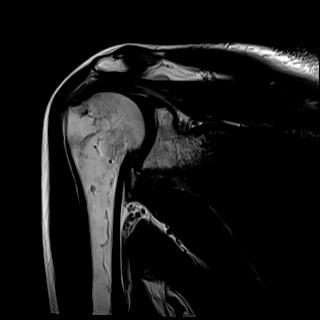
[im 13/20]
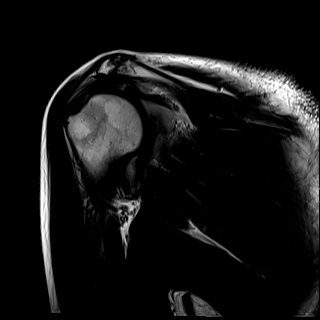
[im 16/20]
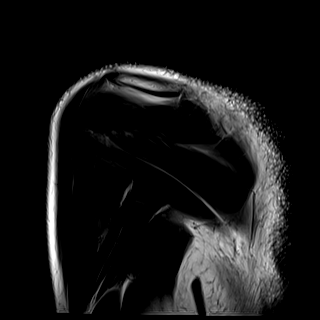
[im 20/20]
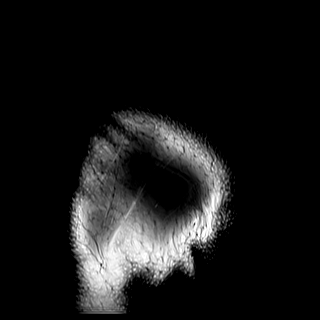

[Series 10: T1 · oblique · right · 3.0mm · 0.59mm/px · 9 of 24 slices shown]
[im 1/24]
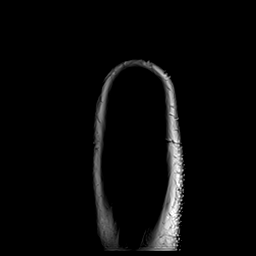
[im 3/24]
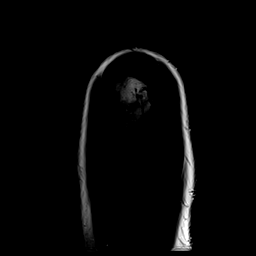
[im 6/24]
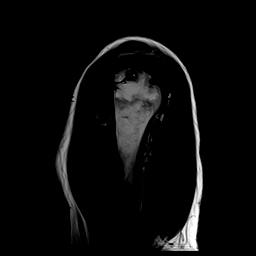
[im 9/24]
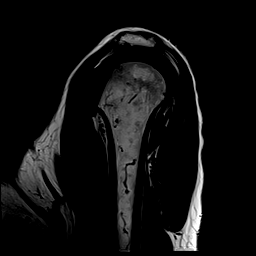
[im 12/24]
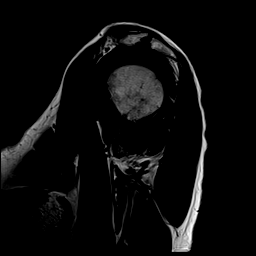
[im 15/24]
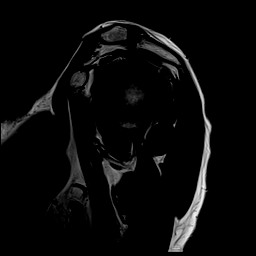
[im 18/24]
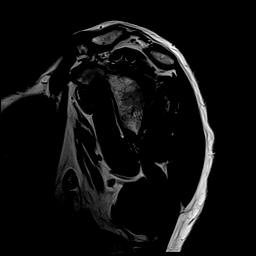
[im 21/24]
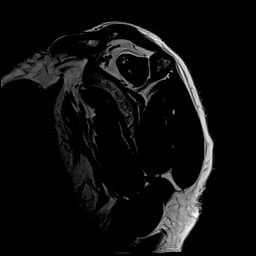
[im 24/24]
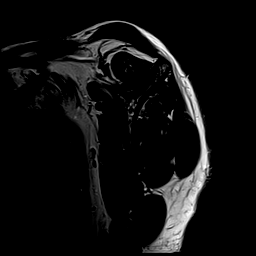

[Series 11: T2 fat-sat · oblique · right · 3.0mm · 0.59mm/px · 9 of 24 slices shown (2 of 3)]
[im 1/24]
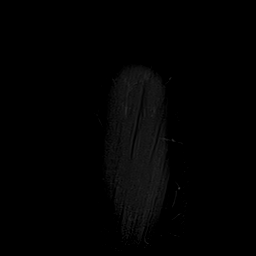
[im 3/24]
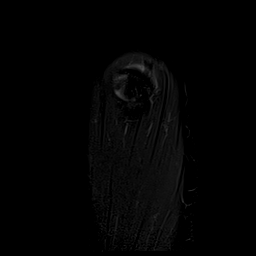
[im 6/24]
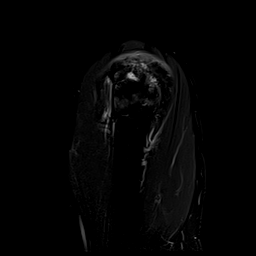
[im 9/24]
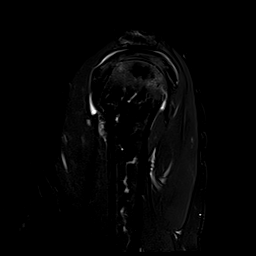
[im 12/24]
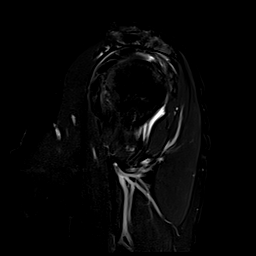
[im 15/24]
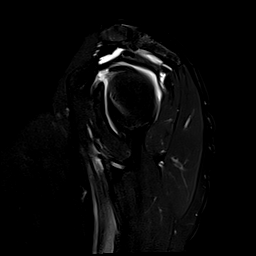
[im 18/24]
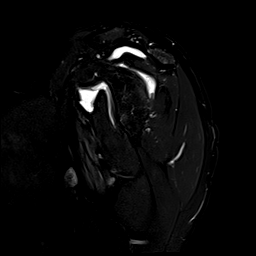
[im 21/24]
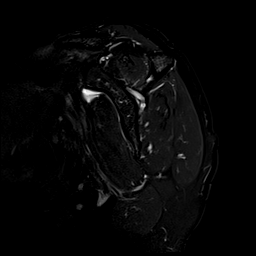
[im 24/24]
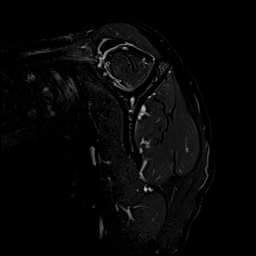

[Series 100: T2 fat-sat · axial · right · 4.0mm · 0.62mm/px · z∈[-39,+41]mm · 7 of 20 slices shown (3 of 3)]
[im 1/20]
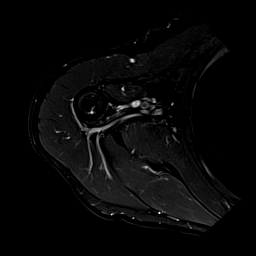
[im 4/20]
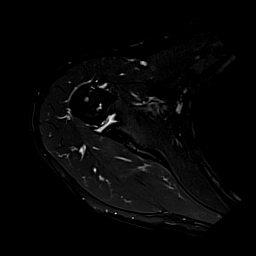
[im 7/20]
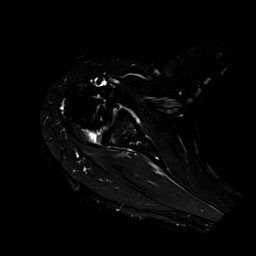
[im 10/20]
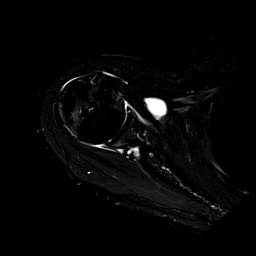
[im 13/20]
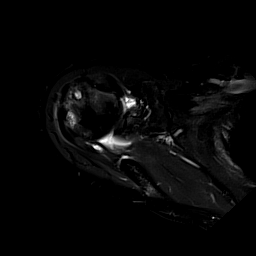
[im 16/20]
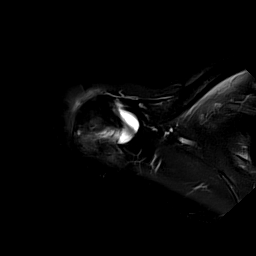
[im 20/20]
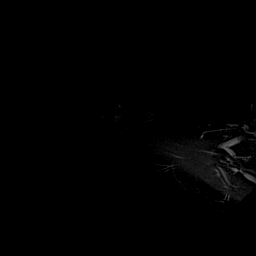

[40 of 40 positions shown; findings below may reference images not displayed]

FINDINGS: Rotator cuff: Severe tendinosis of the supraspinatus tendon with a
high-grade partial-thickness articular surface tear and subcortical
reactive marrow changes. Moderate tendinosis of the infraspinatus
tendon and subcortical reactive marrow changes. Teres minor tendon
is intact. Mild tendinosis of the subscapularis tendon.

Muscles: No muscle atrophy or edema. No intramuscular fluid
collection or hematoma.

Biceps Long Head: Moderate tendinosis of the intra-articular portion
of the long head of the biceps tendon.

Acromioclavicular Joint: Moderate arthropathy of the
acromioclavicular joint. Small amount of subacromial/subdeltoid
bursal fluid. Incidental note made of an os acromiale with reactive
marrow changes across the synchondrosis as can be seen with abnormal
motion across the synchondrosis.

Glenohumeral Joint: Small joint effusion.  No chondral defect.

Labrum: Grossly intact, but evaluation is limited by lack of
intraarticular fluid/contrast.

Bones: No fracture or dislocation. No aggressive osseous lesion.

Other: No fluid collection or hematoma.
IMPRESSION: 1. Severe tendinosis of the supraspinatus tendon with a high-grade
partial-thickness articular surface tear and subcortical reactive
marrow changes.
2. Moderate tendinosis of the infraspinatus tendon.
3. Mild tendinosis of the subscapularis tendon.
4. Moderate tendinosis of the intra-articular portion of the long
head of the biceps tendon.
5. Mild subacromial/subdeltoid bursitis.

## 2022-01-02 ENCOUNTER — Telehealth: Payer: Self-pay

## 2022-01-02 NOTE — Telephone Encounter (Signed)
Called and left VM for pt regarding genetic testing now being covered by her insurance.  Requested call back confirming if she would like to proceed.

## 2022-01-03 ENCOUNTER — Ambulatory Visit (HOSPITAL_COMMUNITY)
Admission: RE | Admit: 2022-01-03 | Discharge: 2022-01-03 | Disposition: A | Discharge status: Home / Self Care | Payer: Medicaid Other | Source: Ambulatory Visit | Attending: Internal Medicine | Admitting: Internal Medicine

## 2022-01-03 ENCOUNTER — Other Ambulatory Visit (HOSPITAL_COMMUNITY): Payer: Self-pay

## 2022-01-03 ENCOUNTER — Encounter: Payer: Self-pay | Admitting: *Deleted

## 2022-01-03 ENCOUNTER — Other Ambulatory Visit: Payer: Self-pay

## 2022-01-03 DIAGNOSIS — I6522 Occlusion and stenosis of left carotid artery: Secondary | ICD-10-CM | POA: Diagnosis not present

## 2022-01-03 DIAGNOSIS — I720 Aneurysm of carotid artery: Secondary | ICD-10-CM | POA: Diagnosis not present

## 2022-01-03 MED ORDER — SODIUM CHLORIDE (PF) 0.9 % IJ SOLN
INTRAMUSCULAR | Status: AC
  Filled 2022-01-03: qty 50

## 2022-01-03 MED ORDER — IOHEXOL 350 MG/ML SOLN
75.0000 mL | Freq: Once | INTRAVENOUS | Status: AC | PRN
  Administered 2022-01-03: 75 mL via INTRAVENOUS

## 2022-01-04 ENCOUNTER — Other Ambulatory Visit (HOSPITAL_COMMUNITY): Payer: Self-pay

## 2022-01-04 ENCOUNTER — Other Ambulatory Visit: Payer: Self-pay | Admitting: Family Medicine

## 2022-01-04 DIAGNOSIS — J439 Emphysema, unspecified: Secondary | ICD-10-CM

## 2022-01-04 NOTE — Telephone Encounter (Signed)
Is this okay to refill? 

## 2022-01-05 ENCOUNTER — Encounter: Payer: Self-pay | Admitting: Orthopaedic Surgery

## 2022-01-05 ENCOUNTER — Telehealth: Payer: Self-pay | Admitting: *Deleted

## 2022-01-05 ENCOUNTER — Ambulatory Visit (INDEPENDENT_AMBULATORY_CARE_PROVIDER_SITE_OTHER): Payer: Medicaid Other | Admitting: Orthopaedic Surgery

## 2022-01-05 ENCOUNTER — Other Ambulatory Visit (HOSPITAL_COMMUNITY): Payer: Self-pay

## 2022-01-05 ENCOUNTER — Other Ambulatory Visit: Payer: Self-pay

## 2022-01-05 DIAGNOSIS — G8929 Other chronic pain: Secondary | ICD-10-CM

## 2022-01-05 DIAGNOSIS — M25511 Pain in right shoulder: Secondary | ICD-10-CM

## 2022-01-05 MED ORDER — ALBUTEROL SULFATE HFA 108 (90 BASE) MCG/ACT IN AERS
2.0000 | INHALATION_SPRAY | Freq: Four times a day (QID) | RESPIRATORY_TRACT | 0 refills | Status: DC | PRN
  Filled 2022-01-05 – 2022-01-14 (×2): qty 18, 25d supply, fill #0

## 2022-01-05 NOTE — Telephone Encounter (Signed)
Faxed referral to Neuro Surgery to [phone]

## 2022-01-05 NOTE — Progress Notes (Signed)
Office Visit Note   Patient: Amanda Rich           Date of Birth: [birth year]           MRN: [id number] Visit Date: 01/05/2022              Requested by: Girtha Rm, PA-C No address on file PCP: Girtha Rm, PA-C   Assessment & Plan: Visit Diagnoses: No diagnosis found.  Plan: Patient is a pleasant 56 year old woman who is here to follow-up on her MRI of her right shoulder.  She is status post axillary lymph node dissection for breast carcinoma 10 months ago.  Since then she has had some increasing problems with her right shoulder both with pain and motion.  She did go to physical therapy in October but it did not seem to help very much.  MRI was ordered today.  MRI did show tendinosis of the supraspinatus with partial-thickness tear on the articular surface with subcortical reactive marrow changes moderate tendinosis of the infraspinatus.  Moderate arthropathy of the acromioclavicular joint.  This was reviewed with her.  Subacromial injection was recommended.  She would like to go forward with this today.  She does have some early adhesive capsulitis she can go almost to full extension slowly by manipulating her arm but has difficulty even going to her back.  We will return her to physical therapy as well.  Follow back in 3 to 4 weeks  Follow-Up Instructions: No follow-ups on file.   Orders:  No orders of the defined types were placed in this encounter.  No orders of the defined types were placed in this encounter.     Procedures: No procedures performed   Clinical Data: No additional findings.   Subjective: No chief complaint on file. Patient presents today for follow up on her right shoulder. She had an MRI and is here today for those results.    Review of Systems  All other systems reviewed and are negative.   Objective: Vital Signs: LMP 10/15/2017 (Approximate)   Physical Exam Constitutional:      Appearance: Normal appearance.  Eyes:      Extraocular Movements: Extraocular movements intact.  Pulmonary:     Effort: Pulmonary effort is normal.  Neurological:     Mental Status: She is alert.    Ortho Exam Examination of her right shoulder she has no swelling no obvious deformity.  She has positive impingement impingement findings.  Distally pulses are intact.  She has forward elevation to almost 180 degrees but does this slowly and has to manipulate her arm in order to get there.  She can only come to her side with attempted manipulation behind her back Specialty Comments:  No specialty comments available.  Imaging: No results found.   PMFS History: Patient Active Problem List   Diagnosis Date Noted   Carotid aneurysm, right ([place]) 12/22/2021   Muscle spasm of right leg 12/08/2021   Chemotherapy-induced neuropathy ([place]) 11/14/2021   Family history of breast cancer    Family history of liver cancer    Malignant neoplasm of 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, estrogen receptor positive ([place]) 08/25/2020   Abnormal chest CT 01/30/2019   History of pericarditis 01/30/2019   Pericarditis 01/22/2019   Chest pain 12/26/2018   History of endometrial ablation 01/21/2018   Family history of colon cancer 09/24/2017   Poor diet 01/24/2017   Weight loss, abnormal 01/24/2017   Smoker 09/22/2016   Mixed hyperlipidemia  08/07/2016   Irregular intermenstrual bleeding 08/07/2016   Depression 08/07/2016   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 Gery Pray   Pericarditis    Smoker    Umbilical hernia     Family History  Problem Relation Age of Onset   Diabetes Mother    Hypertension Mother    Liver cancer Daughter 33   Diabetes  Maternal Aunt    Breast cancer Maternal Aunt 33   Colon cancer Maternal Uncle 28       or prostate cancer   Colon cancer Cousin    Breast cancer Cousin    Autism Half-Brother    Breast cancer Other 62       maternal second cousin   Diabetes Paternal Uncle    Breast cancer Other        dx. 62s, paternal second cousin   Lung cancer Other        dx. 76s, paternal cousin once removed (father's cousin)    Past Surgical History:  Procedure Laterality Date   ABLATION ON ENDOMETRIOSIS     BREAST LUMPECTOMY WITH RADIOACTIVE SEED AND SENTINEL LYMPH NODE BIOPSY Right 02/02/2021   Procedure: RIGHT BREAST LUMPECTOMY WITH RADIOACTIVE SEED AND RIGHT SENTINEL LYMPH NODE MAPPING;  Surgeon: [name], MD;  Location: [facility];  Service: General;  Laterality: Right;   GANGLION CYST EXCISION  2007   L wrist; [facility], [place]   IR IMAGING GUIDED PORT INSERTION  09/10/2020   TUBAL LIGATION     [facility]   UMBILICAL HERNIA REPAIR  2005   [place]   Social History   Occupational History   Not on file  Tobacco Use   Smoking status: Some Days    Packs/day: 1.00    Years: 25.00    Pack years: 25.00    Types: Cigarettes   Smokeless tobacco: Never   Tobacco comments:    Smoking .5 ppd- 09/22/21  Vaping Use   Vaping Use: Never used  Substance and Sexual Activity   Alcohol use: Yes    Alcohol/week: 1.0 standard drink    Types: 1 Glasses of wine per week   Drug use: No   Sexual activity: Yes    Birth control/protection: Post-menopausal

## 2022-01-11 DIAGNOSIS — I671 Cerebral aneurysm, nonruptured: Secondary | ICD-10-CM | POA: Diagnosis not present

## 2022-01-13 ENCOUNTER — Other Ambulatory Visit (HOSPITAL_COMMUNITY): Payer: Self-pay

## 2022-01-16 ENCOUNTER — Other Ambulatory Visit (HOSPITAL_COMMUNITY): Payer: Self-pay

## 2022-01-16 ENCOUNTER — Other Ambulatory Visit: Payer: Self-pay | Admitting: *Deleted

## 2022-01-16 ENCOUNTER — Telehealth: Payer: Self-pay | Admitting: Emergency Medicine

## 2022-01-16 ENCOUNTER — Other Ambulatory Visit: Payer: Self-pay | Admitting: Hematology and Oncology

## 2022-01-16 DIAGNOSIS — F32A Depression, unspecified: Secondary | ICD-10-CM

## 2022-01-16 MED ORDER — CITALOPRAM HYDROBROMIDE 10 MG PO TABS
10.0000 mg | ORAL_TABLET | Freq: Every day | ORAL | 3 refills | Status: DC
  Filled 2022-01-16: qty 90, 90d supply, fill #0

## 2022-01-16 NOTE — Telephone Encounter (Signed)
[id number] - TREATMENT OF ESTABLISHED CHEMOTHERAPY-INDUCED NEUROPATHY WITH N-PALMITOYLETHANOLAMIDE, A CANNABIMIMETIC NUTRACEUTICAL: A RANDOMIZED DOUBLE-BLIND PHASE II PILOT TRIAL  01/16/22  10:10am: Called to complete month 6 telephone call for this study.  Patient did not answer, left voicemail requesting return call.  2:50pm: Patient returned call.  Patient states her neuropathy is stable, she is taking gabapentin 300mg  three times a day, and baclofen 10mg  as needed for muscle spasms.  She has not had any new information about her cancer since last study call.  The patient was thanked for her time and advised she would receive another call at 12 months from registration.  Clabe Seal Clinical Research Coordinator I  01/16/22   10:12 AM

## 2022-01-17 ENCOUNTER — Other Ambulatory Visit (HOSPITAL_COMMUNITY): Payer: Self-pay

## 2022-01-17 ENCOUNTER — Telehealth: Payer: Self-pay | Admitting: *Deleted

## 2022-01-17 NOTE — Telephone Encounter (Signed)
Patient was seen by Dr Kathyrn Sheriff on 01/11/2022.  The plan is to proceed with a diagnostic cerebral angiogram.

## 2022-01-18 ENCOUNTER — Other Ambulatory Visit: Payer: Self-pay | Admitting: Neurosurgery

## 2022-01-18 ENCOUNTER — Other Ambulatory Visit (HOSPITAL_COMMUNITY): Payer: Self-pay

## 2022-01-18 DIAGNOSIS — I671 Cerebral aneurysm, nonruptured: Secondary | ICD-10-CM

## 2022-01-18 MED ORDER — DIPHENHYDRAMINE HCL 25 MG PO CAPS
ORAL_CAPSULE | ORAL | 0 refills | Status: DC
  Filled 2022-01-24: qty 2, fill #0

## 2022-01-18 MED ORDER — PREDNISONE 50 MG PO TABS
ORAL_TABLET | ORAL | 0 refills | Status: DC
  Filled 2022-01-18: qty 3, 1d supply, fill #0

## 2022-01-23 NOTE — Therapy (Signed)
OUTPATIENT PHYSICAL THERAPY SHOULDER EVALUATION   Patient Name: Amanda Rich MRN: [id number] DOB:11-Nov-1966, 56 y.o., female Today's Date: 01/25/2022   PT End of Session - 01/24/22 1444     Visit Number 1    Number of Visits 13    Date for PT Re-Evaluation 03/11/22    Authorization Type [employer]- requesting auth    PT Start Time 7672    PT Stop Time 1525    PT Time Calculation (min) 40 min    Activity Tolerance Patient limited by pain    Behavior During Therapy [facility] for tasks assessed/performed             Past Medical History:  Diagnosis Date   Anemia    Cancer ([place])    breast cancer   Depression    Family history of breast cancer    Family history of liver cancer    History of radiation therapy 03/10/21-04/11/21   Right breast- Dr. Gery Pray   Pericarditis    Smoker    Umbilical hernia    Past Surgical History:  Procedure Laterality Date   ABLATION ON ENDOMETRIOSIS     BREAST LUMPECTOMY WITH RADIOACTIVE SEED AND SENTINEL LYMPH NODE BIOPSY Right 02/02/2021   Procedure: RIGHT BREAST LUMPECTOMY WITH RADIOACTIVE SEED AND RIGHT SENTINEL LYMPH NODE MAPPING;  Surgeon: [name], MD;  Location: [place];  Service: General;  Laterality: Right;   GANGLION CYST EXCISION  2007   L wrist; [facility], [place]   IR IMAGING GUIDED PORT INSERTION  09/10/2020   TUBAL LIGATION     [facility]   UMBILICAL HERNIA REPAIR  2005   [facility]   Patient Active Problem List   Diagnosis Date Noted   Carotid aneurysm, right ([place]) 12/22/2021   Muscle spasm of right leg 12/08/2021   Chemotherapy-induced neuropathy ([place]) 11/14/2021   Family history of breast cancer    Family history of liver cancer    Malignant neoplasm of 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, estrogen receptor positive ([place]) 08/25/2020   Abnormal chest CT 01/30/2019   History of pericarditis 01/30/2019   Pericarditis 01/22/2019   Chest pain 12/26/2018   History of endometrial ablation 01/21/2018    Family history of colon cancer 09/24/2017   Poor diet 01/24/2017   Weight loss, abnormal 01/24/2017   Smoker 09/22/2016   Mixed hyperlipidemia 08/07/2016   Irregular intermenstrual bleeding 08/07/2016   Depression 08/07/2016   Fat necrosis of peritoneum ([place]) 08/07/2016   Pelvic congestion syndrome 08/07/2016    PCP: Marcellina Millin  REFERRING PROVIDER: Garald Balding, MD   REFERRING DIAG: [phone] (ICD-10-CM) - Chronic right shoulder pain   THERAPY DIAG:  Chronic right shoulder pain  Muscle weakness (generalized)   ONSET DATE: March 2022   SUBJECTIVE:  SUBJECTIVE STATEMENT: Patient reports after having her lymph nodes removed in March 2022 secondary to Rt breast cancer she has had limited motion  and pain in the Rt shoulder. She recently received a subacromial injection that has helped some with the pain, but has not improved her motion. She has previously completed PT due to this same complaint, but did not feel that it helped with her motion.   PERTINENT HISTORY: S/P axillary lymph node dissection for breast carcinoma 10 months ago; currently in remission from Rt breast cancer.  Recent subacromial injection   PAIN:  Are you having pain? Yes NPRS scale: 4/10 Pain location: Rt anterolateral shoulder PAIN TYPE: aching Pain description: constant  Aggravating factors: reaching Relieving factors: heat  PRECAUTIONS: None  WEIGHT BEARING RESTRICTIONS No  FALLS:  Has patient fallen in last 6 months? No Number of falls: 0  LIVING ENVIRONMENT: Lives with: lives alone Lives in: House/apartment Stairs: No;  Has following equipment at home: None  OCCUPATION: Disability is pending.    PLOF: Independent  PATIENT GOALS "This arm to get better because I am right-handed."    OBJECTIVE:   DIAGNOSTIC FINDINGS:  MRI IMPRESSION: 1. Severe tendinosis of the supraspinatus tendon with a high-grade partial-thickness articular surface tear and subcortical reactive marrow changes. 2. Moderate tendinosis of the infraspinatus tendon. 3. Mild tendinosis of the subscapularis tendon. 4. Moderate tendinosis of the intra-articular portion of the long head of the biceps tendon. 5. Mild subacromial/subdeltoid bursitis.  PATIENT SURVEYS:  Quick Dash 77% disability  COGNITION:  Overall cognitive status: Within functional limits for tasks assessed     SENSATION:  She reports lack of sensation along axilla secondary to lymph node removal.    POSTURE: Forward head/rounded shoulders   PALPATION: Diffuse tenderness about anterolateral shoulder   JOINT MOBILITY TESTING:  Not assessed   UPPER EXTREMITY AROM/PROM: left shoulder AROM WNL  A/PROM Right 01/25/2022 Left 01/25/2022  Shoulder flexion 110 AROM 140 PROM empty end feel    Shoulder extension    Shoulder abduction 95 AROM 100 PROM empty end feel    Shoulder adduction    Shoulder internal rotation 50 AROM 50 PROM empty end feel   Shoulder external rotation 55 AROM 90 PROM Firm end feel    Elbow flexion    Elbow extension    Wrist flexion    Wrist extension    Wrist ulnar deviation    Wrist radial deviation    Wrist pronation    Wrist supination    (Blank rows = not tested)  UPPER EXTREMITY MMT: Pain with all Rt shoulder MMT  MMT Right 01/25/2022 Left 01/25/2022  Shoulder flexion 3+/5 (in available range) 4+/5  Shoulder extension    Shoulder abduction 4-/5 (in available range) 5/5  Shoulder adduction    Shoulder internal rotation 4-/5 5/5  Shoulder external rotation 4/5 5/5  Middle trapezius    Lower trapezius    Elbow flexion 4-/5 5/5  Elbow extension 4/5 5/5  Wrist flexion    Wrist extension    Wrist ulnar deviation    Wrist radial deviation    Wrist pronation    Wrist supination     Grip strength (lbs)    (Blank rows = not tested)  SHOULDER SPECIAL TESTS:  (+) Neers (+) Michel Bickers (+) Empty Can (-) Nyoka Lint     TODAY'S TREATMENT:  [facility] Adult PT Treatment:  DATE: 01/24/22 Therapeutic Exercise: Demonstrated and issued initial HEP  Therapeutic Activity: Education on assessment findings and impairments that will be addressed throughout duration of care.      PATIENT EDUCATION: Education details: see treatment  Person educated: Patient Education method: Explanation, Demonstration, Tactile cues, Verbal cues, and Handouts Education comprehension: verbalized understanding, returned demonstration, verbal cues required, tactile cues required, and needs further education   HOME EXERCISE PROGRAM: Access Code: [email or web address]  ASSESSMENT:  CLINICAL IMPRESSION: Patient is a 56 y.o. female who was seen today for physical therapy evaluation and treatment for chronic Rt shoulder pain that began in March 2022 following lymph node dissection for Rt breast cancer . Her signs and symptoms appear consistent with impingement syndrome as patient has limited and painful shoulder flexion, abduction and IR A/PROM as well as weakness secondary to pain with all Rt shoulder MMT. She does not appear to have a capsular pattern of restricted ROM at this time as she is able to achieve full shoulder ER passively with normal end feel present. Patient will benefit from skilled PT to address above impairments and improve overall function.  REHAB POTENTIAL: Fair    CLINICAL DECISION MAKING: Stable/uncomplicated  EVALUATION COMPLEXITY: Low   GOALS: Goals reviewed with patient? No  SHORT TERM GOALS:  STG Name Target Date Goal status  1 Patient will be independent and compliant with initial HEP.   Baseline:  02/08/2022 INITIAL   LONG TERM GOALS:   LTG Name Target Date Goal status  1 Patient will demonstrate at least 150 degrees of Rt  shoulder flexion AROM to improve ability to reach overhead.  Baseline: 03/08/2022 INITIAL  2 Patient will demonstrate at least 60 degrees of Rt shoulder IR AROM to improve ability to complete self-care activities.  Baseline: 03/08/2022 INITIAL  3 Patient will demonstrate 4/5 Rt shoulder strength to improve ability to lift items. Baseline: 03/08/2022 INITIAL  4 Patient will demonstrate 5/5 Rt elbow strength to improve ability to carry groceries.  Baseline: 03/08/2022 INITIAL  5 Patient will score <50% disability on QuickDASH to signify clinically meaningful improvement in functional abilities.  03/07/22 Initial    PLAN: PT FREQUENCY: 2x/week  PT DURATION: 6 weeks  PLANNED INTERVENTIONS: Therapeutic exercises, Therapeutic activity, Neuro Muscular re-education, Patient/Family education, Joint mobilization, Aquatic Therapy, Dry Needling, Cryotherapy, Moist heat, Taping, Vasopneumatic device, and Manual therapy  PLAN FOR NEXT SESSION: review HEP, Rt shoulder ROM and strengthening   [employer] Authorization   Choose one: Rehabilitative  Standardized Assessment or Functional Outcome Tool: Quick DASH  Score or Percent Disability: 77  Body Parts Treated (Select each separately):  Shoulder. Overall deficits/functional limitations for body part selected: moderate   Gwendolyn Grant, PT, DPT, ATC 01/25/22 9:17 AM

## 2022-01-24 ENCOUNTER — Other Ambulatory Visit: Payer: Self-pay

## 2022-01-24 ENCOUNTER — Other Ambulatory Visit: Payer: Self-pay | Admitting: Internal Medicine

## 2022-01-24 ENCOUNTER — Other Ambulatory Visit: Payer: Self-pay | Admitting: Neurosurgery

## 2022-01-24 ENCOUNTER — Other Ambulatory Visit: Payer: Self-pay | Admitting: Family Medicine

## 2022-01-24 ENCOUNTER — Ambulatory Visit: Payer: Medicaid Other | Attending: Orthopaedic Surgery

## 2022-01-24 ENCOUNTER — Other Ambulatory Visit: Payer: Self-pay | Admitting: Hematology and Oncology

## 2022-01-24 DIAGNOSIS — M25511 Pain in right shoulder: Secondary | ICD-10-CM | POA: Diagnosis not present

## 2022-01-24 DIAGNOSIS — M6281 Muscle weakness (generalized): Secondary | ICD-10-CM | POA: Insufficient documentation

## 2022-01-24 DIAGNOSIS — Z853 Personal history of malignant neoplasm of breast: Secondary | ICD-10-CM | POA: Diagnosis not present

## 2022-01-24 DIAGNOSIS — G8929 Other chronic pain: Secondary | ICD-10-CM | POA: Insufficient documentation

## 2022-01-25 ENCOUNTER — Other Ambulatory Visit: Payer: Self-pay | Admitting: *Deleted

## 2022-01-25 ENCOUNTER — Other Ambulatory Visit (HOSPITAL_COMMUNITY): Payer: Self-pay

## 2022-01-25 ENCOUNTER — Encounter: Payer: Self-pay | Admitting: Hematology and Oncology

## 2022-01-25 DIAGNOSIS — Z419 Encounter for procedure for purposes other than remedying health state, unspecified: Secondary | ICD-10-CM | POA: Diagnosis not present

## 2022-01-25 MED ORDER — BACLOFEN 10 MG PO TABS
10.0000 mg | ORAL_TABLET | Freq: Three times a day (TID) | ORAL | 0 refills | Status: DC | PRN
  Filled 2022-01-25: qty 60, 20d supply, fill #0

## 2022-01-25 MED ORDER — VITAMIN D 25 MCG (1000 UNIT) PO TABS
2000.0000 [IU] | ORAL_TABLET | Freq: Every day | ORAL | 0 refills | Status: DC
  Filled 2022-01-25: qty 90, 45d supply, fill #0

## 2022-01-31 NOTE — Therapy (Incomplete)
OUTPATIENT PHYSICAL THERAPY TREATMENT NOTE   Patient Name: Amanda Rich MRN: [id number] DOB:1966-02-22, 56 y.o., female Today's Date: 01/31/2022  PCP: Marcellina Millin REFERRING PROVIDER: Davy Pique    Past Medical History:  Diagnosis Date   Anemia    Cancer [facility])    breast cancer   Depression    Family history of breast cancer    Family history of liver cancer    History of radiation therapy 03/10/21-04/11/21   Right breast- Dr. Gery Pray   Pericarditis    Smoker    Umbilical hernia    Past Surgical History:  Procedure Laterality Date   ABLATION ON ENDOMETRIOSIS     BREAST LUMPECTOMY WITH RADIOACTIVE SEED AND SENTINEL LYMPH NODE BIOPSY Right 02/02/2021   Procedure: RIGHT BREAST LUMPECTOMY WITH RADIOACTIVE SEED AND RIGHT SENTINEL LYMPH NODE MAPPING;  Surgeon: [name], MD;  Location: [place];  Service: General;  Laterality: Right;   GANGLION CYST EXCISION  2007   L wrist; [facility], [place]   IR IMAGING GUIDED PORT INSERTION  09/10/2020   TUBAL LIGATION     [facility]   UMBILICAL HERNIA REPAIR  2005   [facility]   Patient Active Problem List   Diagnosis Date Noted   Carotid aneurysm, right ([place]) 12/22/2021   Muscle spasm of right leg 12/08/2021   Chemotherapy-induced neuropathy ([place]) 11/14/2021   Family history of breast cancer    Family history of liver cancer    Malignant neoplasm of 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, estrogen receptor positive ([place]) 08/25/2020   Abnormal chest CT 01/30/2019   History of pericarditis 01/30/2019   Pericarditis 01/22/2019   Chest pain 12/26/2018   History of endometrial ablation 01/21/2018   Family history of colon cancer 09/24/2017   Poor diet 01/24/2017   Weight loss, abnormal 01/24/2017   Smoker 09/22/2016   Mixed hyperlipidemia 08/07/2016   Irregular intermenstrual bleeding 08/07/2016   Depression 08/07/2016   Fat necrosis of peritoneum ([place]) 08/07/2016   Pelvic congestion  syndrome 08/07/2016    REFERRING DIAG: M25.511,G89.29 (ICD-10-CM) - Chronic right shoulder pain   THERAPY DIAG:  No diagnosis found.  PERTINENT HISTORY: S/P axillary lymph node dissection for breast carcinoma 10 months ago; currently in remission from Rt breast cancer.  Recent subacromial injection   PRECAUTIONS: None  SUBJECTIVE: ***  PAIN:  Are you having pain? {yes/no:20286} NPRS scale: ***/10 Pain location: *** Pain orientation: {Pain Orientation:25161}  PAIN TYPE: {type:313116} Pain description: {PAIN DESCRIPTION:21022940}  Aggravating factors: *** Relieving factors: ***   OBJECTIVE:   *Unless otherwise noted all objective measures were captured on initial evaluation.   DIAGNOSTIC FINDINGS:  MRI IMPRESSION: 1. Severe tendinosis of the supraspinatus tendon with a high-grade partial-thickness articular surface tear and subcortical reactive marrow changes. 2. Moderate tendinosis of the infraspinatus tendon. 3. Mild tendinosis of the subscapularis tendon. 4. Moderate tendinosis of the intra-articular portion of the long head of the biceps tendon. 5. Mild subacromial/subdeltoid bursitis.   PATIENT SURVEYS:  Quick Dash 77% disability   COGNITION:          Overall cognitive status: Within functional limits for tasks assessed                               SENSATION:          She reports lack of sensation along axilla secondary to lymph node removal.  POSTURE: Forward head/rounded shoulders    PALPATION: Diffuse tenderness about anterolateral shoulder    JOINT MOBILITY TESTING:  Not assessed    UPPER EXTREMITY AROM/PROM: left shoulder AROM WNL   A/PROM Right 01/25/2022 Left 01/25/2022  Shoulder flexion 110 AROM 140 PROM empty end feel     Shoulder extension      Shoulder abduction 95 AROM 100 PROM empty end feel     Shoulder adduction      Shoulder internal rotation 50 AROM 50 PROM empty end feel    Shoulder external rotation 55 AROM 90  PROM Firm end feel     Elbow flexion      Elbow extension      Wrist flexion      Wrist extension      Wrist ulnar deviation      Wrist radial deviation      Wrist pronation      Wrist supination      (Blank rows = not tested)   UPPER EXTREMITY MMT: Pain with all Rt shoulder MMT   MMT Right 01/25/2022 Left 01/25/2022  Shoulder flexion 3+/5 (in available range) 4+/5  Shoulder extension      Shoulder abduction 4-/5 (in available range) 5/5  Shoulder adduction      Shoulder internal rotation 4-/5 5/5  Shoulder external rotation 4/5 5/5  Middle trapezius      Lower trapezius      Elbow flexion 4-/5 5/5  Elbow extension 4/5 5/5  Wrist flexion      Wrist extension      Wrist ulnar deviation      Wrist radial deviation      Wrist pronation      Wrist supination      Grip strength (lbs)      (Blank rows = not tested)   SHOULDER SPECIAL TESTS:           (+) Neers (+) Michel Bickers (+) Empty Can (-) Nyoka Lint               TODAY'S TREATMENT:  [facility] Adult PT Treatment:                                                DATE: 02/01/22 Therapeutic Exercise: *** Manual Therapy: *** Neuromuscular re-ed: *** Therapeutic Activity: *** Modalities: *** Self Care: ***       PATIENT EDUCATION: Education details: see treatment  Person educated: Patient Education method: Explanation, Demonstration, Tactile cues, Verbal cues, and Handouts Education comprehension: verbalized understanding, returned demonstration, verbal cues required, tactile cues required, and needs further education     HOME EXERCISE PROGRAM: Access Code: [email or web address]   ASSESSMENT:   CLINICAL IMPRESSION:    REHAB POTENTIAL: Fair     CLINICAL DECISION MAKING: Stable/uncomplicated   EVALUATION COMPLEXITY: Low     GOALS: Goals reviewed with patient? No   SHORT TERM GOALS:   STG Name Target Date Goal status  1 Patient will be independent and compliant with initial HEP.    Baseline:  02/08/2022 INITIAL     LONG TERM GOALS:    LTG Name Target Date Goal status  1 Patient will demonstrate at least 150 degrees of Rt shoulder flexion AROM to improve ability to reach overhead.  Baseline: 03/08/2022 INITIAL  2 Patient will demonstrate at least 60 degrees of Rt shoulder IR AROM to improve  ability to complete self-care activities.  Baseline: 03/08/2022 INITIAL  3 Patient will demonstrate 4/5 Rt shoulder strength to improve ability to lift items. Baseline: 03/08/2022 INITIAL  4 Patient will demonstrate 5/5 Rt elbow strength to improve ability to carry groceries.  Baseline: 03/08/2022 INITIAL  5 Patient will score <50% disability on QuickDASH to signify clinically meaningful improvement in functional abilities.  03/07/22 Initial     PLAN: PT FREQUENCY: 2x/week   PT DURATION: 6 weeks   PLANNED INTERVENTIONS: Therapeutic exercises, Therapeutic activity, Neuro Muscular re-education, Patient/Family education, Joint mobilization, Aquatic Therapy, Dry Needling, Cryotherapy, Moist heat, Taping, Vasopneumatic device, and Manual therapy   PLAN FOR NEXT SESSION: review HEP, Rt shoulder ROM and strengthening       Gwendolyn Grant, PT, DPT, ATC 01/31/22 1:34 PM

## 2022-02-01 ENCOUNTER — Ambulatory Visit: Payer: Medicaid Other

## 2022-02-02 ENCOUNTER — Encounter: Payer: Self-pay | Admitting: Orthopaedic Surgery

## 2022-02-02 ENCOUNTER — Ambulatory Visit (INDEPENDENT_AMBULATORY_CARE_PROVIDER_SITE_OTHER): Payer: Medicaid Other | Admitting: Orthopaedic Surgery

## 2022-02-02 ENCOUNTER — Other Ambulatory Visit: Payer: Self-pay

## 2022-02-02 ENCOUNTER — Telehealth (HOSPITAL_COMMUNITY): Payer: Self-pay | Admitting: *Deleted

## 2022-02-02 DIAGNOSIS — G8929 Other chronic pain: Secondary | ICD-10-CM | POA: Diagnosis not present

## 2022-02-02 DIAGNOSIS — M25511 Pain in right shoulder: Secondary | ICD-10-CM | POA: Diagnosis not present

## 2022-02-02 NOTE — Progress Notes (Signed)
Office Visit Note   Patient: Amanda Rich           Date of Birth: [birth year]           MRN: [id number] Visit Date: 02/02/2022              Requested by: Girtha Rm, PA-C [place],  [place] [address] PCP: Irene Pap, PA-C   Assessment & Plan: Visit Diagnoses:  1. Chronic right shoulder pain     Plan: Amanda Rich has been going to physical therapy and feels like it is making a big difference with her right shoulder pain.  She has better motion and less discomfort.  There is no numbness or tingling.  She did have an MRI scan performed about a month ago demonstrating severe tendinosis of the supraspinatus tendon with a high-grade partial-thickness articular surface tear and moderate tendinosis of the infra spinatus tendon and mild tendinosis of the subscapularis tendon.  There was some tendinosis of the long head of the biceps tendon.  She still has a little loss of internal rotation but has quick overhead movement and minimally positive impingement.  There is a negative speeds sign.  She will continue with therapy and then a home exercise program she certainly can try over-the-counter medicines but as long as she is comfortable and happy and making progress we will consider surgery.  All questions were answered  Follow-Up Instructions: Return if symptoms worsen or fail to improve.   Orders:  No orders of the defined types were placed in this encounter.  No orders of the defined types were placed in this encounter.     Procedures: No procedures performed   Clinical Data: No additional findings.   Subjective: Chief Complaint  Patient presents with   Right Shoulder - Follow-up  Patient presents today for a one month follow up on her right shoulder. She has been going to physical therapy once. She said that she is doing much better. She is taking baclofen and gabapentin.   HPI  Review of Systems   Objective: Vital Signs: LMP 10/15/2017  (Approximate)   Physical Exam Constitutional:      Appearance: She is well-developed.  Eyes:     Pupils: Pupils are equal, round, and reactive to light.  Pulmonary:     Effort: Pulmonary effort is normal.  Skin:    General: Skin is warm and dry.  Neurological:     Mental Status: She is alert and oriented to person, place, and time.  Psychiatric:        Behavior: Behavior normal.    Ortho Exam awake alert and oriented x3.  Comfortable sitting.  Able to place her right shoulder quickly overhead without evidence of loss of motion.  She still has some difficulty with internal rotation but she could take her left hand and place her right hand in the middle of her back.  Abduction passively to about 80 degrees I can take a little bit higher actively with little bit of impingement testing.  No crepitation.  No pain about the biceps or over the biceps tendon negative speeds sign.  Good grip and release  Specialty Comments:  No specialty comments available.  Imaging: No results found.   PMFS History: Patient Active Problem List   Diagnosis Date Noted   Pain in right shoulder 02/02/2022   Carotid aneurysm, right ([facility]) 12/22/2021   Muscle spasm of right leg 12/08/2021   Chemotherapy-induced neuropathy ([place]) 11/14/2021  Family history of breast cancer    Family history of liver cancer    Malignant neoplasm of 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, estrogen receptor positive ([place]) 08/25/2020   Abnormal chest CT 01/30/2019   History of pericarditis 01/30/2019   Pericarditis 01/22/2019   Chest pain 12/26/2018   History of endometrial ablation 01/21/2018   Family history of colon cancer 09/24/2017   Poor diet 01/24/2017   Weight loss, abnormal 01/24/2017   Smoker 09/22/2016   Mixed hyperlipidemia 08/07/2016   Irregular intermenstrual bleeding 08/07/2016   Depression 08/07/2016   Fat necrosis of peritoneum ([place]) 08/07/2016   Pelvic congestion syndrome 08/07/2016   Past  Medical History:  Diagnosis Date   Anemia    Cancer ([place])    breast cancer   Depression    Family history of breast cancer    Family history of liver cancer    History of radiation therapy 03/10/21-04/11/21   Right breast- Dr. Gery Pray   Pericarditis    Smoker    Umbilical hernia     Family History  Problem Relation Age of Onset   Diabetes Mother    Hypertension Mother    Liver cancer Daughter 35   Diabetes Maternal Aunt    Breast cancer Maternal Aunt 43   Colon cancer Maternal Uncle 49       or prostate cancer   Colon cancer Cousin    Breast cancer Cousin    Autism Half-Brother    Breast cancer Other 55       maternal second cousin   Diabetes Paternal Uncle    Breast cancer Other        dx. 29s, paternal second cousin   Lung cancer Other        dx. 40s, paternal cousin once removed (father's cousin)    Past Surgical History:  Procedure Laterality Date   ABLATION ON ENDOMETRIOSIS     BREAST LUMPECTOMY WITH RADIOACTIVE SEED AND SENTINEL LYMPH NODE BIOPSY Right 02/02/2021   Procedure: RIGHT BREAST LUMPECTOMY WITH RADIOACTIVE SEED AND RIGHT SENTINEL LYMPH NODE MAPPING;  Surgeon: [name], MD;  Location: [place];  Service: General;  Laterality: Right;   GANGLION CYST EXCISION  2007   L wrist; [facility], [place]   IR IMAGING GUIDED PORT INSERTION  09/10/2020   TUBAL LIGATION     [facility]   UMBILICAL HERNIA REPAIR  2005   [facility]   Social History   Occupational History   Not on file  Tobacco Use   Smoking status: Some Days    Packs/day: 1.00    Years: 25.00    Pack years: 25.00    Types: Cigarettes   Smokeless tobacco: Never   Tobacco comments:    Smoking .5 ppd- 09/22/21  Vaping Use   Vaping Use: Never used  Substance and Sexual Activity   Alcohol use: Yes    Alcohol/week: 1.0 standard drink    Types: 1 Glasses of wine per week   Drug use: No   Sexual activity: Yes    Birth control/protection: Post-menopausal

## 2022-02-02 NOTE — Therapy (Incomplete)
OUTPATIENT PHYSICAL THERAPY TREATMENT NOTE   Patient Name: Amanda Rich MRN: [id number] DOB:12/30/1965, 56 y.o., female Today's Date: 02/02/2022  PCP: Marcellina Millin REFERRING PROVIDER: Davy Pique    Past Medical History:  Diagnosis Date   Anemia    Cancer [facility])    breast cancer   Depression    Family history of breast cancer    Family history of liver cancer    History of radiation therapy 03/10/21-04/11/21   Right breast- Dr. Gery Pray   Pericarditis    Smoker    Umbilical hernia    Past Surgical History:  Procedure Laterality Date   ABLATION ON ENDOMETRIOSIS     BREAST LUMPECTOMY WITH RADIOACTIVE SEED AND SENTINEL LYMPH NODE BIOPSY Right 02/02/2021   Procedure: RIGHT BREAST LUMPECTOMY WITH RADIOACTIVE SEED AND RIGHT SENTINEL LYMPH NODE MAPPING;  Surgeon: [name], MD;  Location: [facility];  Service: General;  Laterality: Right;   GANGLION CYST EXCISION  2007   L wrist; [facility], [place]   IR IMAGING GUIDED PORT INSERTION  09/10/2020   TUBAL LIGATION     [facility]   UMBILICAL HERNIA REPAIR  2005   [facility]   Patient Active Problem List   Diagnosis Date Noted   Carotid aneurysm, right ([place]) 12/22/2021   Muscle spasm of right leg 12/08/2021   Chemotherapy-induced neuropathy ([place]) 11/14/2021   Family history of breast cancer    Family history of liver cancer    Malignant neoplasm of 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, estrogen receptor positive ([place]) 08/25/2020   Abnormal chest CT 01/30/2019   History of pericarditis 01/30/2019   Pericarditis 01/22/2019   Chest pain 12/26/2018   History of endometrial ablation 01/21/2018   Family history of colon cancer 09/24/2017   Poor diet 01/24/2017   Weight loss, abnormal 01/24/2017   Smoker 09/22/2016   Mixed hyperlipidemia 08/07/2016   Irregular intermenstrual bleeding 08/07/2016   Depression 08/07/2016   Fat necrosis of peritoneum ([place]) 08/07/2016   Pelvic congestion  syndrome 08/07/2016    REFERRING DIAG: M25.511,G89.29 (ICD-10-CM) - Chronic right shoulder pain   THERAPY DIAG:  No diagnosis found.  PERTINENT HISTORY: S/P axillary lymph node dissection for breast carcinoma 10 months ago; currently in remission from Rt breast cancer.  Recent subacromial injection   PRECAUTIONS: None  SUBJECTIVE: ***  PAIN:  Are you having pain? {yes/no:20286} NPRS scale: ***/10 Pain location: *** Pain orientation: {Pain Orientation:25161}  PAIN TYPE: {type:313116} Pain description: {PAIN DESCRIPTION:21022940}  Aggravating factors: *** Relieving factors: ***   OBJECTIVE:   *Unless otherwise noted all objective measures were captured on initial evaluation.   DIAGNOSTIC FINDINGS:  MRI IMPRESSION: 1. Severe tendinosis of the supraspinatus tendon with a high-grade partial-thickness articular surface tear and subcortical reactive marrow changes. 2. Moderate tendinosis of the infraspinatus tendon. 3. Mild tendinosis of the subscapularis tendon. 4. Moderate tendinosis of the intra-articular portion of the long head of the biceps tendon. 5. Mild subacromial/subdeltoid bursitis.   PATIENT SURVEYS:  Quick Dash 77% disability   COGNITION:          Overall cognitive status: Within functional limits for tasks assessed                               SENSATION:          She reports lack of sensation along axilla secondary to lymph node removal.  POSTURE: Forward head/rounded shoulders    PALPATION: Diffuse tenderness about anterolateral shoulder    JOINT MOBILITY TESTING:  Not assessed    UPPER EXTREMITY AROM/PROM: left shoulder AROM WNL   A/PROM Right 01/25/2022 Left 01/25/2022  Shoulder flexion 110 AROM 140 PROM empty end feel     Shoulder extension      Shoulder abduction 95 AROM 100 PROM empty end feel     Shoulder adduction      Shoulder internal rotation 50 AROM 50 PROM empty end feel    Shoulder external rotation 55 AROM 90  PROM Firm end feel     Elbow flexion      Elbow extension      Wrist flexion      Wrist extension      Wrist ulnar deviation      Wrist radial deviation      Wrist pronation      Wrist supination      (Blank rows = not tested)   UPPER EXTREMITY MMT: Pain with all Rt shoulder MMT   MMT Right 01/25/2022 Left 01/25/2022  Shoulder flexion 3+/5 (in available range) 4+/5  Shoulder extension      Shoulder abduction 4-/5 (in available range) 5/5  Shoulder adduction      Shoulder internal rotation 4-/5 5/5  Shoulder external rotation 4/5 5/5  Middle trapezius      Lower trapezius      Elbow flexion 4-/5 5/5  Elbow extension 4/5 5/5  Wrist flexion      Wrist extension      Wrist ulnar deviation      Wrist radial deviation      Wrist pronation      Wrist supination      Grip strength (lbs)      (Blank rows = not tested)   SHOULDER SPECIAL TESTS:           (+) Neers (+) Michel Bickers (+) Empty Can (-) Nyoka Lint               TODAY'S TREATMENT:  [facility] Adult PT Treatment:                                                DATE: 02/01/22 Therapeutic Exercise: *** Manual Therapy: *** Neuromuscular re-ed: *** Therapeutic Activity: *** Modalities: *** Self Care: ***       PATIENT EDUCATION: Education details: see treatment  Person educated: Patient Education method: Explanation, Demonstration, Tactile cues, Verbal cues, and Handouts Education comprehension: verbalized understanding, returned demonstration, verbal cues required, tactile cues required, and needs further education     HOME EXERCISE PROGRAM: Access Code: [email or web address]   ASSESSMENT:   CLINICAL IMPRESSION:    REHAB POTENTIAL: Fair     CLINICAL DECISION MAKING: Stable/uncomplicated   EVALUATION COMPLEXITY: Low     GOALS: Goals reviewed with patient? No   SHORT TERM GOALS:   STG Name Target Date Goal status  1 Patient will be independent and compliant with initial HEP.    Baseline:  02/08/2022 INITIAL     LONG TERM GOALS:    LTG Name Target Date Goal status  1 Patient will demonstrate at least 150 degrees of Rt shoulder flexion AROM to improve ability to reach overhead.  Baseline: 03/08/2022 INITIAL  2 Patient will demonstrate at least 60 degrees of Rt shoulder IR AROM to improve  ability to complete self-care activities.  Baseline: 03/08/2022 INITIAL  3 Patient will demonstrate 4/5 Rt shoulder strength to improve ability to lift items. Baseline: 03/08/2022 INITIAL  4 Patient will demonstrate 5/5 Rt elbow strength to improve ability to carry groceries.  Baseline: 03/08/2022 INITIAL  5 Patient will score <50% disability on QuickDASH to signify clinically meaningful improvement in functional abilities.  03/07/22 Initial     PLAN: PT FREQUENCY: 2x/week   PT DURATION: 6 weeks   PLANNED INTERVENTIONS: Therapeutic exercises, Therapeutic activity, Neuro Muscular re-education, Patient/Family education, Joint mobilization, Aquatic Therapy, Dry Needling, Cryotherapy, Moist heat, Taping, Vasopneumatic device, and Manual therapy   PLAN FOR NEXT SESSION: review HEP, Rt shoulder ROM and strengthening       Gwendolyn Grant, PT, DPT, ATC 02/02/22 2:35 PM

## 2022-02-03 ENCOUNTER — Inpatient Hospital Stay (HOSPITAL_COMMUNITY): Admission: RE | Admit: 2022-02-03 | Payer: Medicaid Other | Source: Ambulatory Visit

## 2022-02-06 ENCOUNTER — Other Ambulatory Visit (HOSPITAL_COMMUNITY): Payer: Self-pay | Admitting: Neurosurgery

## 2022-02-06 ENCOUNTER — Ambulatory Visit: Payer: Medicaid Other

## 2022-02-06 ENCOUNTER — Ambulatory Visit (HOSPITAL_COMMUNITY)
Admission: RE | Admit: 2022-02-06 | Discharge: 2022-02-06 | Disposition: A | Discharge status: Home / Self Care | Payer: Medicaid Other | Source: Ambulatory Visit | Attending: Neurosurgery | Admitting: Neurosurgery

## 2022-02-06 ENCOUNTER — Other Ambulatory Visit: Payer: Self-pay

## 2022-02-06 DIAGNOSIS — Z853 Personal history of malignant neoplasm of breast: Secondary | ICD-10-CM | POA: Diagnosis not present

## 2022-02-06 DIAGNOSIS — I671 Cerebral aneurysm, nonruptured: Secondary | ICD-10-CM | POA: Diagnosis not present

## 2022-02-06 DIAGNOSIS — F17210 Nicotine dependence, cigarettes, uncomplicated: Secondary | ICD-10-CM | POA: Insufficient documentation

## 2022-02-06 HISTORY — PX: IR ANGIO VERTEBRAL SEL VERTEBRAL BILAT MOD SED: IMG5369

## 2022-02-06 HISTORY — PX: IR ANGIO INTRA EXTRACRAN SEL INTERNAL CAROTID BILAT MOD SED: IMG5363

## 2022-02-06 LAB — CBC WITH DIFFERENTIAL/PLATELET
Abs Immature Granulocytes: 0.02 10*3/uL (ref 0.00–0.07)
Basophils Absolute: 0 10*3/uL (ref 0.0–0.1)
Basophils Relative: 0 %
Eosinophils Absolute: 0 10*3/uL (ref 0.0–0.5)
Eosinophils Relative: 0 %
HCT: 39.5 % (ref 36.0–46.0)
Hemoglobin: 12.5 g/dL (ref 12.0–15.0)
Immature Granulocytes: 0 %
Lymphocytes Relative: 14 %
Lymphs Abs: 0.7 10*3/uL (ref 0.7–4.0)
MCH: 28.4 pg (ref 26.0–34.0)
MCHC: 31.6 g/dL (ref 30.0–36.0)
MCV: 89.8 fL (ref 80.0–100.0)
Monocytes Absolute: 0 10*3/uL — ABNORMAL LOW (ref 0.1–1.0)
Monocytes Relative: 1 %
Neutro Abs: 4.6 10*3/uL (ref 1.7–7.7)
Neutrophils Relative %: 85 %
Platelets: 260 10*3/uL (ref 150–400)
RBC: 4.4 MIL/uL (ref 3.87–5.11)
RDW: 13.7 % (ref 11.5–15.5)
WBC: 5.4 10*3/uL (ref 4.0–10.5)
nRBC: 0 % (ref 0.0–0.2)

## 2022-02-06 LAB — BASIC METABOLIC PANEL
Anion gap: 9 (ref 5–15)
BUN: 12 mg/dL (ref 6–20)
CO2: 24 mmol/L (ref 22–32)
Calcium: 9.6 mg/dL (ref 8.9–10.3)
Chloride: 104 mmol/L (ref 98–111)
Creatinine, Ser: 0.74 mg/dL (ref 0.44–1.00)
GFR, Estimated: >60 mL/min (ref >60)
Glucose, Bld: 161 mg/dL — ABNORMAL HIGH (ref 70–99)
Potassium: 4.2 mmol/L (ref 3.5–5.1)
Sodium: 137 mmol/L (ref 135–145)

## 2022-02-06 LAB — URINALYSIS, ROUTINE W REFLEX MICROSCOPIC
Bilirubin Urine: NEGATIVE
Glucose, UA: NEGATIVE mg/dL
Hgb urine dipstick: NEGATIVE
Ketones, ur: NEGATIVE mg/dL
Nitrite: NEGATIVE
Protein, ur: NEGATIVE mg/dL
Specific Gravity, Urine: 1.009 (ref 1.005–1.030)
pH: 6 (ref 5.0–8.0)

## 2022-02-06 LAB — PROTIME-INR
INR: 0.9 (ref 0.8–1.2)
Prothrombin Time: 11.9 seconds (ref 11.4–15.2)

## 2022-02-06 LAB — APTT: aPTT: 31 seconds (ref 24–36)

## 2022-02-06 IMAGING — XA IR ANGIO VETEBRAL SEL VERTEBRAL BILAT MOD SED
8 of 10 series · 12 of 24 positions shown · IV contrast (IODINE)
Comparison: none

PROCEDURE:
DIAGNOSTIC CEREBRAL ANGIOGRAM
HISTORY: The patient is a 55-year-old woman with a history of breast cancer
seen by Neuro-Oncology. She has a longstanding history of neuropathy
and has undergone outpatient CT angiogram of the head incidentally
revealing a right internal carotid artery aneurysm. She therefore
presents today for further workup with diagnostic cerebral
angiogram.
TECHNIQUE: CATHETERS AND WIRES
5-French JB-1 catheter

[Series 1: cerebral care 2 · 2 acquisitions, 1 frame shown (1 of 7)]
[im 1/2]
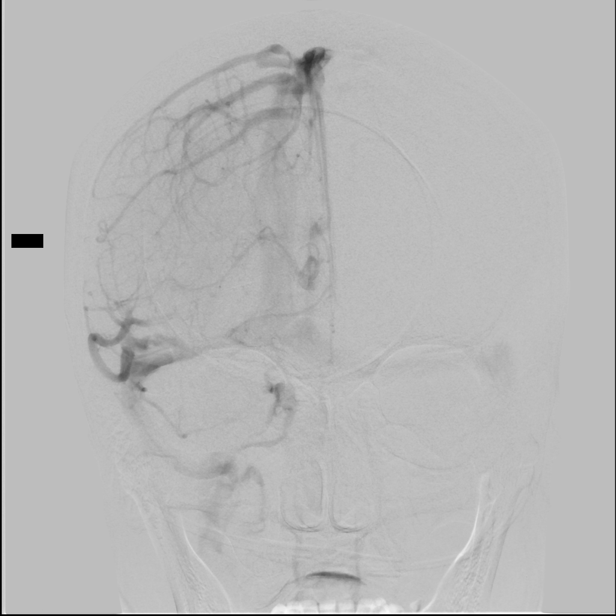

[Series 2: cerebral care 2 · 2 acquisitions, 2 frames shown (2 of 7)]
[im 1/2]
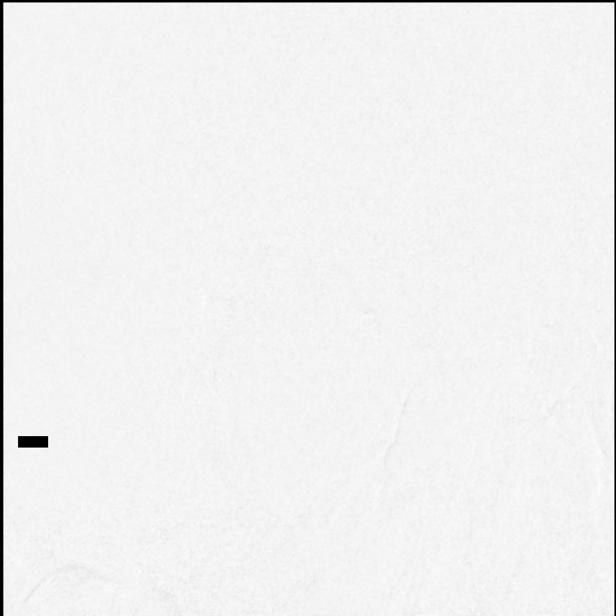
[im 2/2]
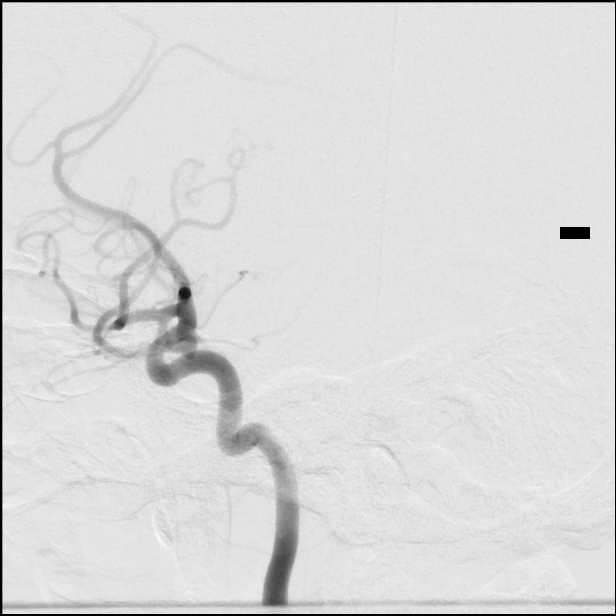

[Series 3: cerebral care 2 · 2 acquisitions, 2 frames shown (3 of 7)]
[im 1/2]
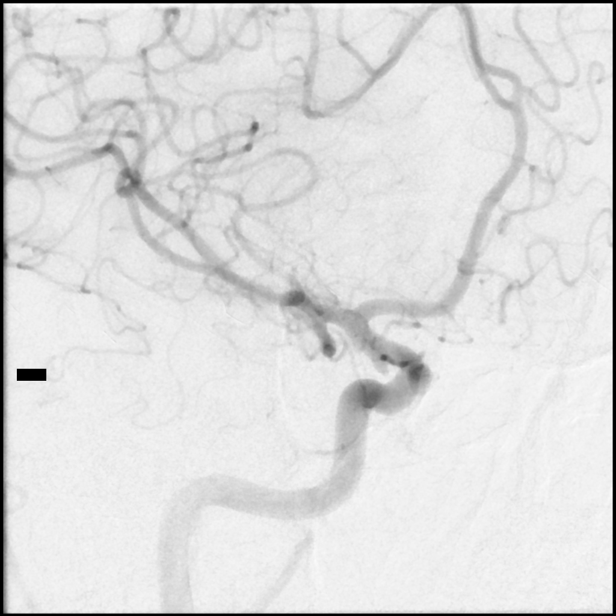
[im 2/2]
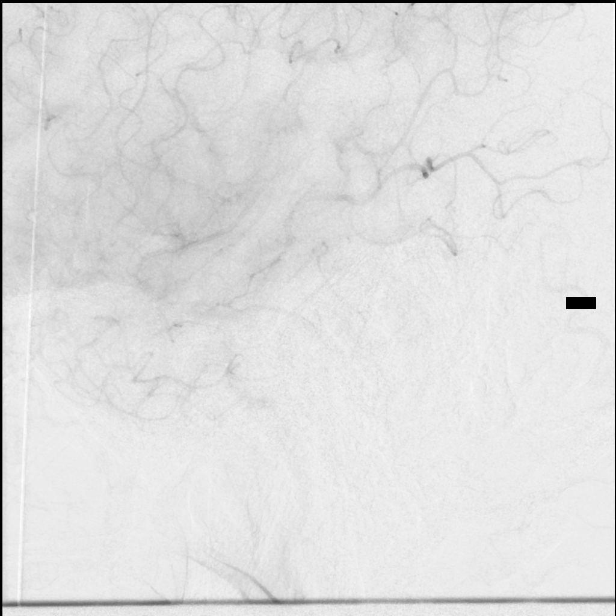

[Series 5: cerebral care 2 · 2 acquisitions, 1 frame shown (4 of 7)]
[im 1/2]
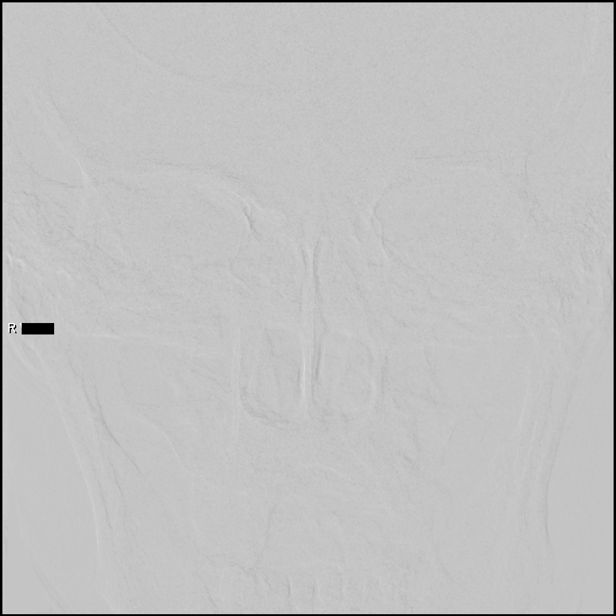

[Series 6: cerebral care 2 · 2 acquisitions, 1 frame shown (5 of 7)]
[im 1/2]
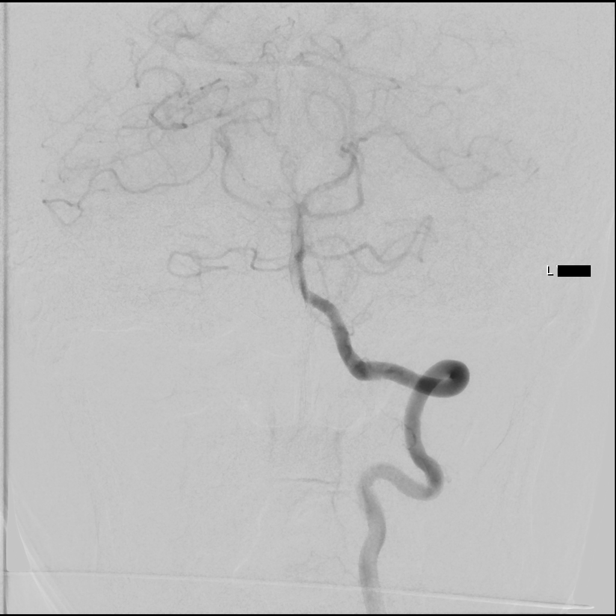

[Series 7: cerebral care 2 · 2 acquisitions, 1 frame shown (6 of 7)]
[im 1/2]
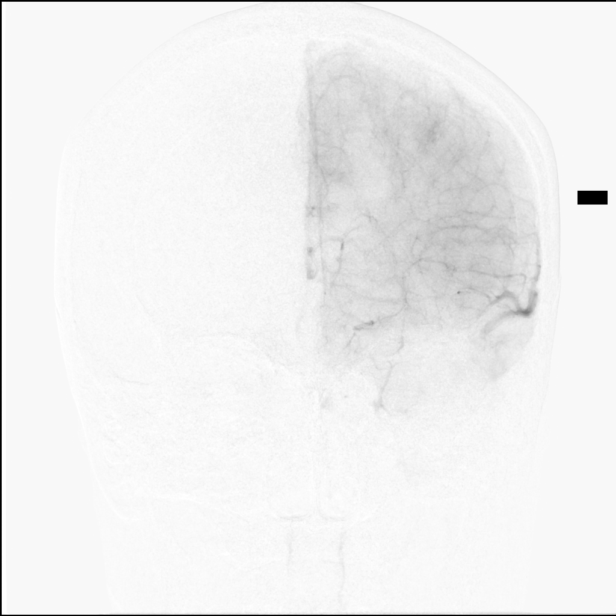

[Series 9: cerebral care 2 · 2 acquisitions, 1 frame shown (7 of 7)]
[im 1/2]
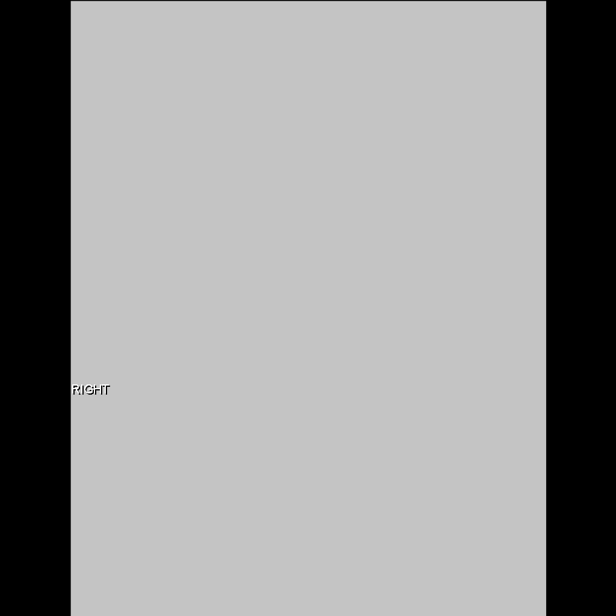

[Series 300: ir angio vertebral sel vertebral bilat m · 3 of 18 slices shown]
[im 2/18]
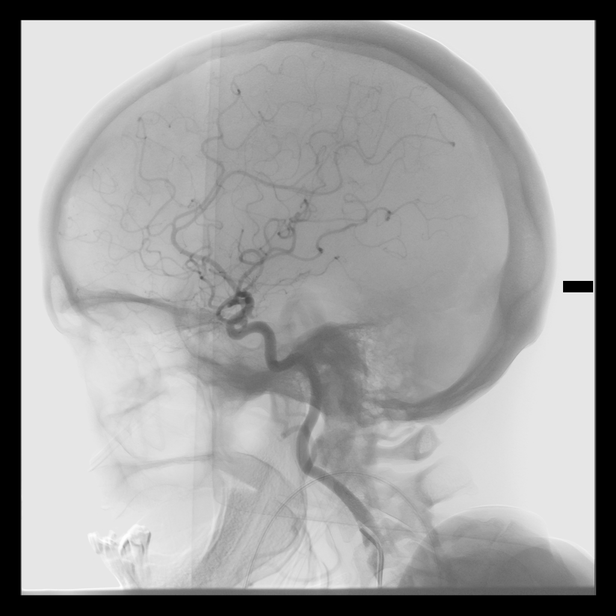
[im 10/18]
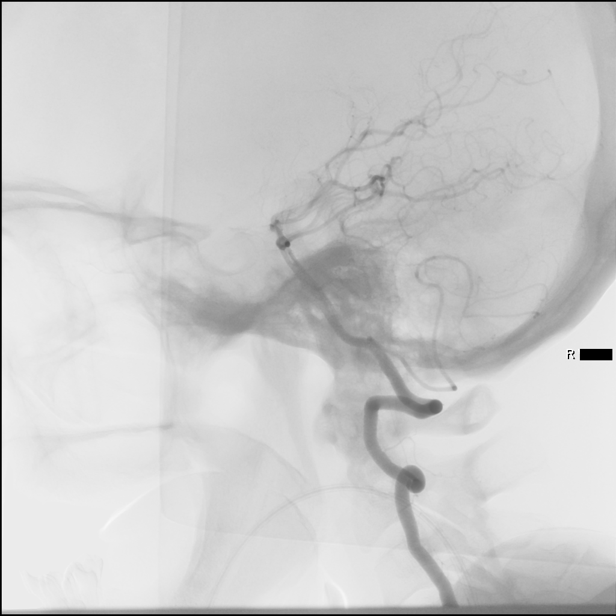
[im 18/18]
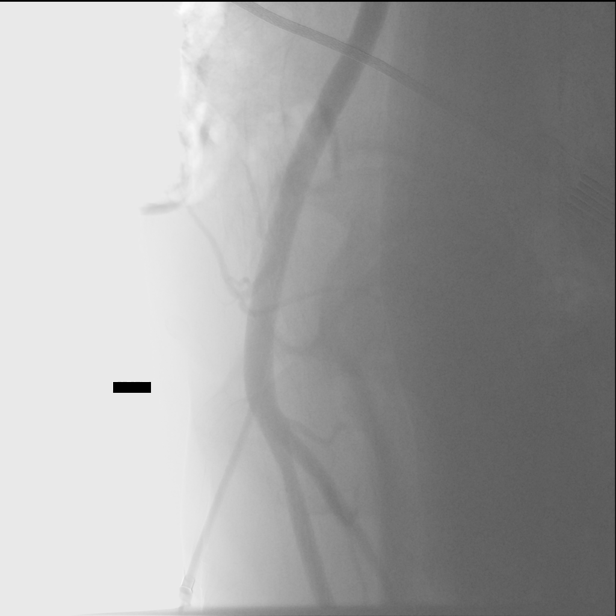

[12 of 24 positions shown; findings below may reference images not displayed]

ACCESS:
The technical aspects of the procedure as well as its potential
risks and benefits were reviewed with the patient. These risks
included but were not limited bleeding, infection, allergic
reaction, damage to organs or vital structures, stroke,
non-diagnostic procedure, and the catastrophic outcomes of heart
attack, coma, and death. With an understanding of these risks,
informed consent was obtained and witnessed. The patient was placed
in the supine position on the angiography table and the skin of
right groin prepped in the usual sterile fashion.

The procedure was performed under local anesthesia (1%-solution of
bicarbonate-buffered Lidocaine) and conscious sedation with 1mg
versed and [SO] fentanyl monitored by myself and the
in-suite nurse using continuous pulse-oximetry, heart rate, and
non-invasive blood-pressure.

A 5- French sheath was introduced in the right common femoral artery
using Seldinger technique. A fluoro-phase sequence was used to
document the sheath position.

MEDICATIONS:
HEPARIN: [SO] Units total.

CONTRAST:  cc, Omnipaque 300

FLUOROSCOPY TIME:  FLUOROSCOPY TIME: See IR records
0.035" glidewire

VESSELS CATHETERIZED
Right internal carotid

Left internal carotid

Left vertebral

Right vertebral

Right common femoral

VESSELS STUDIED
Right internal carotid, head

Left internal carotid, head

Left vertebral

Right vertebral

Right femoral

PROCEDURAL NARRATIVE
A 5-Fr JB-1 catheter was advanced over a 0.035 glidewire into the
aortic arch. The above vessels were then sequentially catheterized
and cervical / cerebral angiograms taken. After review of images,
the catheter was removed without incident.
FINDINGS: Right internal carotid, head:

Injection reveals the presence of a widely patent ICA, M1, and A1
segments and their branches. There is a somewhat irregular shaped
medially projecting aneurysm of the supraclinoid internal carotid
artery measuring approximately 5.2 mm antero posterior, 4.3 mm
medial-lateral, and 3.0 mm craniocaudal. The neck of this aneurysm
measures approximately 2 mm. The parenchymal and venous phases are
normal. The venous sinuses are widely patent.

Left internal carotid, head:

Injection reveals the presence of a widely patent ICA, A1, and M1
segments and their branches. No aneurysms, AVMs, or high-flow
fistulas are seen. The parenchymal and venous phases are normal. The
venous sinuses are widely patent.

Right vertebral:

Injection reveals the presence of a widely patent vertebral artery.
This leads to a widely patent basilar artery that terminates in
bilateral P1. The basilar apex is normal. No aneurysms, AVMs, or
high-flow fistulas are seen. The parenchymal and venous phases are
normal. The venous sinuses are widely patent.

Left vertebral:

The vertebral artery is widely patent. No PICA aneurysm is seen. See
basilar description above.

Right femoral:

Normal vessel. No significant atherosclerotic disease. Arterial
sheath in adequate position.

DISPOSITION:
Upon completion of the study, the femoral sheath was removed and
hemostasis obtained using a 5-Fr ExoSeal closure device. Good
proximal and distal lower extremity pulses were documented upon
achievement of hemostasis. The procedure was well tolerated and no
early complications were observed. The patient was transferred to
the holding area to lay flat for 2 hours.
IMPRESSION: 1. Approximally 5 mm right superior hypophyseal artery aneurysm, as
described above.

2. No other intracranial aneurysms, arteriovenous malformations, or
fistulas are identified.

The preliminary results of this procedure were shared with the
patient and the patient's family.

## 2022-02-06 MED ORDER — LIDOCAINE HCL 1 % IJ SOLN
INTRAMUSCULAR | Status: AC | PRN
  Administered 2022-02-06: 5 mL via INTRADERMAL

## 2022-02-06 MED ORDER — CEFAZOLIN SODIUM-DEXTROSE 2-4 GM/100ML-% IV SOLN: 2.0000 g | INTRAVENOUS | Status: DC

## 2022-02-06 MED ORDER — CHLORHEXIDINE GLUCONATE CLOTH 2 % EX PADS: 6.0000 | MEDICATED_PAD | Freq: Once | CUTANEOUS | Status: DC

## 2022-02-06 MED ORDER — IOHEXOL 300 MG/ML  SOLN: 100.0000 mL | Freq: Once | INTRAMUSCULAR | Status: DC | PRN

## 2022-02-06 MED ORDER — MIDAZOLAM HCL 2 MG/2ML IJ SOLN
INTRAMUSCULAR | Status: AC | PRN
  Administered 2022-02-06: 1 mg via INTRAVENOUS

## 2022-02-06 MED ORDER — FENTANYL CITRATE (PF) 100 MCG/2ML IJ SOLN
INTRAMUSCULAR | Status: AC | PRN
  Administered 2022-02-06: 25 ug via INTRAVENOUS

## 2022-02-06 MED ORDER — HEPARIN SODIUM (PORCINE) 1000 UNIT/ML IJ SOLN
INTRAMUSCULAR | Status: AC | PRN
  Administered 2022-02-06: 2000 [IU] via INTRAVENOUS

## 2022-02-06 MED ORDER — SODIUM CHLORIDE 0.9 % IV SOLN: INTRAVENOUS | Status: DC

## 2022-02-06 MED ORDER — HYDROCODONE-ACETAMINOPHEN 5-325 MG PO TABS: 1.0000 | ORAL_TABLET | ORAL | Status: DC | PRN

## 2022-02-06 NOTE — Sedation Documentation (Signed)
Bedside handoff with Threasa Beards, RN in short stay. Pulses palpable, groin site level 0 with dressing clean/dry/intact.

## 2022-02-06 NOTE — Brief Op Note (Signed)
°  NEUROSURGERY BRIEF OPERATIVE  NOTE   PREOP DX: RICA aneurysm  POSTOP DX: Same  PROCEDURE: Diagnostic cerebral angiogram  SURGEON: Dr. Consuella Lose, MD  ANESTHESIA: IV Sedation with Local  EBL: Minimal  SPECIMENS: None  COMPLICATIONS: None  CONDITION: Stable to recovery  FINDINGS (Full report in [facility]): 1. 48mm right SHA aneurysm   Consuella Lose, MD [facility]

## 2022-02-06 NOTE — H&P (Signed)
Chief Complaint  Aneurysm  History of Present Illness  [name] is a 56 year old woman seen at the request of Dr. Mickeal Rich.  She is referred for recent incidental discovery of cerebral aneurysm.  Briefly, patient was diagnosed with breast cancer about one and half years ago after finding a breast mass.  She underwent neoadjuvant chemotherapy which included Taxol.  Patient subsequently underwent lumpectomy with right-sided axillary lymph node dissection.  Thankfully, she had negative margins on the lumpectomy and nodes were negative for carcinoma.  She did undergo subsequent adjuvant radiation treatments and is currently on maintenance Arimidex therapy.  Unfortunately, likely as a result of the Taxol neoadjuvant therapy, she has complained of peripheral neuropathy including muscle spasms which involve primarily the right leg.  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  Patient was therefore referred for neurosurgical evaluation. Upon questioning, patient does not report any significant headaches or visual changes.  Of note, the patient does not report any history of hypertension.  She has a history of breast cancer as above.  She also has a history of pericarditis about two years ago possibly related to viral infection which may have been COVID.  No history of heart attack.  She does have a diagnosis of emphysema and does use daily inhalers.  No history of liver or kidney disease.  She is not on any blood thinners or antiplatelet medications.  Patient is a smoker although she is trying to quit.  She does have two cousins on her mother's side with brain aneurysms.  No first degree relatives with brain aneurysms or unexplained intracranial hemorrhage.  Past Medical History   Past Medical History:  Diagnosis Date   Anemia     Cancer ([place])    breast cancer   Depression    Family history of breast cancer    Family history of liver cancer    History of radiation therapy 03/10/21-04/11/21   Right breast- Dr. Gery Rich   Pericarditis    Smoker    Umbilical hernia     Past Surgical History   Past Surgical History:  Procedure Laterality Date   ABLATION ON ENDOMETRIOSIS     BREAST LUMPECTOMY WITH RADIOACTIVE SEED AND SENTINEL LYMPH NODE BIOPSY Right 02/02/2021   Procedure: RIGHT BREAST LUMPECTOMY WITH RADIOACTIVE SEED AND RIGHT SENTINEL LYMPH NODE MAPPING;  Surgeon: Amanda Luna, MD;  Location: [facility];  Service: General;  Laterality: Right;   GANGLION CYST EXCISION  2007   L wrist; [facility], [place]   IR IMAGING GUIDED PORT INSERTION  09/10/2020   TUBAL LIGATION     [facility]   UMBILICAL HERNIA REPAIR  2005   [facility]    Social History   Social History   Tobacco Use   Smoking status: Some Days    Packs/day: 1.00    Years: 25.00    Pack years: 25.00    Types: Cigarettes   Smokeless tobacco: Never   Tobacco comments:    Smoking .5 ppd- 09/22/21  Vaping Use   Vaping Use: Never used  Substance Use Topics   Alcohol use: Yes    Alcohol/week: 1.0 standard drink    Types: 1 Glasses of wine per week   Drug use: No    Medications   Prior to Admission medications   Medication Sig Start Date End Date Taking?  Authorizing Provider  albuterol (PROAIR HFA) 108 (90 Base) MCG/ACT inhaler Inhale 2 puffs into the lungs every 6 (six) hours as needed for wheezing or shortness of breath. 01/05/22  Yes Amanda Lung, MD  anastrozole (ARIMIDEX) 1 MG tablet Take 1 tablet (1 mg total) by mouth daily. 08/30/21  Yes Amanda Lose, MD  baclofen (LIORESAL) 10 MG tablet Take 1 tablet (10 mg total) by mouth 3 (three) times daily as needed for muscle spasms. 01/25/22  Yes Vaslow, Amanda Lav, MD  budesonide-formoterol (SYMBICORT) 80-4.5 MCG/ACT inhaler Inhale 2 puffs into the lungs in the morning and at bedtime. 06/15/21   Yes Hunsucker, Bonna Gains, MD  CALCIUM PO Take by mouth.   Yes [provider]  cholecalciferol (VITAMIN D3) 25 MCG (1000 UNIT) tablet Take 2 tablets (2,000 Units total) by mouth daily. 01/25/22  Yes Amanda Lose, MD  citalopram (CELEXA) 10 MG tablet Take 1 tablet (10 mg total) by mouth daily. 01/16/22  Yes Amanda Lose, MD  diphenhydrAMINE (BENADRYL) 25 mg capsule take 2 capsule by mouth 1 hour prior to scheduled procedure 01/18/22  Yes   gabapentin (NEURONTIN) 300 MG capsule Take 1 capsule (300 mg total) by mouth 3 (three) times daily. 12/22/21  Yes Vaslow, Amanda Lav, MD  nicotine (NICODERM CQ - DOSED IN MG/24 HR) 7 mg/24hr patch PLACE 1 PATCH ONTO THE SKIN DAILY. 12/29/21 12/29/22 Yes Hunsucker, Bonna Gains, MD  predniSONE (DELTASONE) 50 MG tablet Take 1 tablet by mouth 13, 7, and 1 hour prior to scheduled procedure 01/18/22  Yes   Tiotropium Bromide Monohydrate (SPIRIVA RESPIMAT) 2.5 MCG/ACT AERS Inhale 2 puffs into the lungs daily. 09/22/21  Yes Hunsucker, Bonna Gains, MD  prochlorperazine (COMPAZINE) 10 MG tablet Take 1 tablet (10 mg total) by mouth every 6 (six) hours as needed (Nausea or vomiting). 09/01/20 01/31/21  Amanda Lose, MD    Allergies   Allergies  Allergen Reactions   Clarithromycin Swelling    Biaxin   Ivp Dye [Iodinated Contrast Media] Rash    Review of Systems  ROS  Neurologic Exam  Awake, alert, oriented Memory and concentration grossly intact Speech fluent, appropriate CN grossly intact Motor exam: Upper Extremities Deltoid Bicep Tricep Grip  Right 5/5 5/5 5/5 5/5  Left 5/5 5/5 5/5 5/5   Lower Extremities IP Quad PF DF EHL  Right 5/5 5/5 5/5 5/5 5/5  Left 5/5 5/5 5/5 5/5 5/5   Sensation grossly intact to LT  Imaging  CT angiogram of the head dated 01/03/2022 was personally reviewed.  This demonstrates a medially projecting aneurysm of the supraclinoid right internal carotid artery measuring approximately 4-5 mm.   Impression  56 year old woman with  incidentally discovered approximately 4-5 mm likely superior hypophyseal artery aneurysm on the right.  While she does have a history of breast cancer this appears to be well treated and given her age I do think that further workup with the intention to treat the aneurysm is reasonable.  Plan  - we will plan on proceeding with diagnostic cerebral angiogram  I have reviewed the procedure with the patient at length in the office including risks, benefits, and alternatives. All questions today were answered and she provided informed consent to proceed.  Consuella Lose, MD [facility]

## 2022-02-06 NOTE — Sedation Documentation (Signed)
5 fr exoseal deployed at 0936, pressure held a few minutes then dressing of gauze and tegaderm applied to right femoral site.

## 2022-02-08 ENCOUNTER — Ambulatory Visit: Payer: Medicaid Other

## 2022-02-09 NOTE — Therapy (Signed)
OUTPATIENT PHYSICAL THERAPY TREATMENT NOTE   Patient Name: Amanda Rich MRN: [id number] DOB:07-24-1966, 56 y.o., female Today's Date: 02/13/2022  PCP: Marcellina Millin REFERRING PROVIDER: Davy Pique   PT End of Session - 02/13/22 1416     Visit Number 2    Number of Visits 13    Date for PT Re-Evaluation 03/11/22    Authorization Type [employer]    Authorization Time Period 2/3-04/24/22    Authorization - Visit Number 1    Authorization - Number of Visits 12    PT Start Time 9242    PT Stop Time 6834    PT Time Calculation (min) 42 min    Activity Tolerance Patient limited by pain;Patient tolerated treatment well    Behavior During Therapy [facility] for tasks assessed/performed             Past Medical History:  Diagnosis Date   Anemia    Cancer ([place])    breast cancer   Depression    Family history of breast cancer    Family history of liver cancer    History of radiation therapy 03/10/21-04/11/21   Right breast- Dr. Gery Pray   Pericarditis    Smoker    Umbilical hernia    Past Surgical History:  Procedure Laterality Date   ABLATION ON ENDOMETRIOSIS     BREAST LUMPECTOMY WITH RADIOACTIVE SEED AND SENTINEL LYMPH NODE BIOPSY Right 02/02/2021   Procedure: RIGHT BREAST LUMPECTOMY WITH RADIOACTIVE SEED AND RIGHT SENTINEL LYMPH NODE MAPPING;  Surgeon: [name], MD;  Location: [place];  Service: General;  Laterality: Right;   GANGLION CYST EXCISION  2007   L wrist; [facility], [place]   IR ANGIO INTRA EXTRACRAN SEL INTERNAL CAROTID BILAT MOD SED  02/06/2022   IR ANGIO VERTEBRAL SEL VERTEBRAL BILAT MOD SED  02/06/2022   IR IMAGING GUIDED PORT INSERTION  09/10/2020   TUBAL LIGATION     [facility]   UMBILICAL HERNIA REPAIR  2005   [place]   Patient Active Problem List   Diagnosis Date Noted   Pain in right shoulder 02/02/2022   Carotid aneurysm, right ([place]) 12/22/2021   Muscle spasm of right leg 12/08/2021   Chemotherapy-induced  neuropathy ([place]) 11/14/2021   Family history of breast cancer    Family history of liver cancer    Malignant neoplasm of 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, estrogen receptor positive ([place]) 08/25/2020   Abnormal chest CT 01/30/2019   History of pericarditis 01/30/2019   Pericarditis 01/22/2019   Chest pain 12/26/2018   History of endometrial ablation 01/21/2018   Family history of colon cancer 09/24/2017   Poor diet 01/24/2017   Weight loss, abnormal 01/24/2017   Smoker 09/22/2016   Mixed hyperlipidemia 08/07/2016   Irregular intermenstrual bleeding 08/07/2016   Depression 08/07/2016   Fat necrosis of peritoneum ([place]) 08/07/2016   Pelvic congestion syndrome 08/07/2016    REFERRING DIAG: M25.511,G89.29 (ICD-10-CM) - Chronic right shoulder pain   THERAPY DIAG:  Chronic right shoulder pain  Muscle weakness (generalized)  PERTINENT HISTORY: S/P axillary lymph node dissection for breast carcinoma 10 months ago; currently in remission from Rt breast cancer.  Recent subacromial injection   PRECAUTIONS: None  SUBJECTIVE: Patient reports the shoulder is feeling good. She has been trying to use it more.   PAIN:  Are you having pain? No Worst NPRS scale: 7/10 Pain location: anterolateral Rt shoulder  PAIN TYPE:sharp, pinch  Pain description: intermittent  Aggravating factors:  reaching  Relieving factors: unknown    OBJECTIVE:   *Unless otherwise noted all objective measures were captured on initial evaluation.   DIAGNOSTIC FINDINGS:  MRI IMPRESSION: 1. Severe tendinosis of the supraspinatus tendon with a high-grade partial-thickness articular surface tear and subcortical reactive marrow changes. 2. Moderate tendinosis of the infraspinatus tendon. 3. Mild tendinosis of the subscapularis tendon. 4. Moderate tendinosis of the intra-articular portion of the long head of the biceps tendon. 5. Mild subacromial/subdeltoid bursitis.   PATIENT SURVEYS:  Quick Dash  77% disability                               SENSATION:          She reports lack of sensation along axilla secondary to lymph node removal.            POSTURE: Forward head/rounded shoulders    PALPATION: Diffuse tenderness about anterolateral shoulder       UPPER EXTREMITY AROM/PROM: left shoulder AROM WNL   A/PROM Right 01/25/2022 Left 01/25/2022 02/13/22 Right   Shoulder flexion 110 AROM 140 PROM empty end feel    150 AROM  Shoulder extension       Shoulder abduction 95 AROM 100 PROM empty end feel      Shoulder adduction       Shoulder internal rotation 50 AROM 50 PROM empty end feel     Shoulder external rotation 55 AROM 90 PROM Firm end feel      Elbow flexion       Elbow extension       Wrist flexion       Wrist extension       Wrist ulnar deviation       Wrist radial deviation       Wrist pronation       Wrist supination       (Blank rows = not tested)   UPPER EXTREMITY MMT: Pain with all Rt shoulder MMT   MMT Right 01/25/2022 Left 01/25/2022  Shoulder flexion 3+/5 (in available range) 4+/5  Shoulder extension      Shoulder abduction 4-/5 (in available range) 5/5  Shoulder adduction      Shoulder internal rotation 4-/5 5/5  Shoulder external rotation 4/5 5/5  Middle trapezius      Lower trapezius      Elbow flexion 4-/5 5/5  Elbow extension 4/5 5/5  Wrist flexion      Wrist extension      Wrist ulnar deviation      Wrist radial deviation      Wrist pronation      Wrist supination      Grip strength (lbs)      (Blank rows = not tested)   SHOULDER SPECIAL TESTS:           (+) Neers (+) Michel Bickers (+) Empty Can (-) Nyoka Lint               TODAY'S TREATMENT:  [facility] Adult PT Treatment:                                                DATE: 02/13/22 Therapeutic Exercise: Pulleys 2 min each flex/scaption Supine shoulder flexion AAROM 1 x 10  Chest press with dowel 1 x 10  Supine shoulder abduction AAROM  1 x 10  Scapular retraction 1 x 10   Bilateral shoulder ER yellow band 1 x 10  Manual Therapy: Rt shoulder PROM to tolerance all planes Rt shoulder GHJ mobilizations grade II-III inferior, anterior and posterior         PATIENT EDUCATION: Education details: N/A Person educated: n/a Education method: n/a Education comprehension: n/a     HOME EXERCISE PROGRAM: Access Code: [email or web address]   ASSESSMENT:   CLINICAL IMPRESSION:  Patient arrives without reports of pain, though remains fairly guarded with PROM with limitations noted into flexion, abduction, and IR secondary to reports of pain. She has notable improvement into active shoulder flexion compared to initial evaluation. Fair tolerance to ROM and strengthening today as patient reports increased pain when she attempts to go through full range with AAROM, though when instructed to reduce range, no reports of pain. She quickly fatigues with rotator cuff strengthening having difficulty controlling eccentric phase of motion.    REHAB POTENTIAL: Fair     CLINICAL DECISION MAKING: Stable/uncomplicated   EVALUATION COMPLEXITY: Low     GOALS: Goals reviewed with patient? No   SHORT TERM GOALS:   STG Name Target Date Goal status  1 Patient will be independent and compliant with initial HEP.    Baseline:  02/08/2022 INITIAL    LONG TERM GOALS:    LTG Name Target Date Goal status  1 Patient will demonstrate at least 150 degrees of Rt shoulder flexion AROM to improve ability to reach overhead.  Baseline: 03/08/2022 INITIAL  2 Patient will demonstrate at least 60 degrees of Rt shoulder IR AROM to improve ability to complete self-care activities.  Baseline: 03/08/2022 INITIAL  3 Patient will demonstrate 4/5 Rt shoulder strength to improve ability to lift items. Baseline: 03/08/2022 INITIAL  4 Patient will demonstrate 5/5 Rt elbow strength to improve ability to carry groceries.  Baseline: 03/08/2022 INITIAL  5 Patient will score <50% disability on QuickDASH to signify  clinically meaningful improvement in functional abilities.  03/07/22 Initial     PLAN: PT FREQUENCY: 2x/week   PT DURATION: 6 weeks   PLANNED INTERVENTIONS: Therapeutic exercises, Therapeutic activity, Neuro Muscular re-education, Patient/Family education, Joint mobilization, Aquatic Therapy, Dry Needling, Cryotherapy, Moist heat, Taping, Vasopneumatic device, and Manual therapy   PLAN FOR NEXT SESSION: update HEP, Rt shoulder ROM and strengthening       Gwendolyn Grant, PT, DPT, ATC 02/13/22 2:59 PM

## 2022-02-13 ENCOUNTER — Ambulatory Visit: Payer: Medicaid Other | Attending: Orthopaedic Surgery

## 2022-02-13 ENCOUNTER — Other Ambulatory Visit: Payer: Self-pay

## 2022-02-13 ENCOUNTER — Other Ambulatory Visit (HOSPITAL_COMMUNITY): Payer: Self-pay

## 2022-02-13 DIAGNOSIS — I671 Cerebral aneurysm, nonruptured: Secondary | ICD-10-CM | POA: Diagnosis not present

## 2022-02-13 DIAGNOSIS — G8929 Other chronic pain: Secondary | ICD-10-CM | POA: Insufficient documentation

## 2022-02-13 DIAGNOSIS — M25511 Pain in right shoulder: Secondary | ICD-10-CM | POA: Diagnosis not present

## 2022-02-13 DIAGNOSIS — M6281 Muscle weakness (generalized): Secondary | ICD-10-CM | POA: Insufficient documentation

## 2022-02-13 MED ORDER — BRILINTA 90 MG PO TABS
90.0000 mg | ORAL_TABLET | Freq: Two times a day (BID) | ORAL | 6 refills | Status: DC
  Filled 2022-02-13: qty 60, 30d supply, fill #0

## 2022-02-13 MED ORDER — ASPIRIN 81 MG PO CHEW
CHEWABLE_TABLET | ORAL | 6 refills | Status: DC
  Filled 2022-02-13: qty 30, 30d supply, fill #0

## 2022-02-13 MED ORDER — DIPHENHYDRAMINE HCL 25 MG PO CAPS: ORAL_CAPSULE | ORAL | 0 refills | Status: DC

## 2022-02-13 MED ORDER — PREDNISONE 50 MG PO TABS
ORAL_TABLET | ORAL | 0 refills | Status: DC
  Filled 2022-02-13: qty 3, 1d supply, fill #0

## 2022-02-14 NOTE — Therapy (Signed)
OUTPATIENT PHYSICAL THERAPY TREATMENT NOTE   Patient Name: Amanda Rich MRN: [id number] DOB:1966-03-20, 56 y.o., female Today's Date: 02/15/2022  PCP: Marcellina Millin REFERRING PROVIDER: Garald Balding, MD   PT End of Session - 02/15/22 1533     Visit Number 3    Number of Visits 13    Date for PT Re-Evaluation 03/11/22    Authorization Type [employer]    Authorization Time Period 2/3-04/24/22    Authorization - Visit Number 2    Authorization - Number of Visits 12    PT Start Time 2585    PT Stop Time 1611    PT Time Calculation (min) 38 min    Activity Tolerance Patient tolerated treatment well    Behavior During Therapy [facility] for tasks assessed/performed              Past Medical History:  Diagnosis Date   Anemia    Cancer ([place])    breast cancer   Depression    Family history of breast cancer    Family history of liver cancer    History of radiation therapy 03/10/21-04/11/21   Right breast- Dr. Gery Pray   Pericarditis    Smoker    Umbilical hernia    Past Surgical History:  Procedure Laterality Date   ABLATION ON ENDOMETRIOSIS     BREAST LUMPECTOMY WITH RADIOACTIVE SEED AND SENTINEL LYMPH NODE BIOPSY Right 02/02/2021   Procedure: RIGHT BREAST LUMPECTOMY WITH RADIOACTIVE SEED AND RIGHT SENTINEL LYMPH NODE [place];  Surgeon: [name], MD;  Location: [place];  Service: General;  Laterality: Right;   GANGLION CYST EXCISION  2007   L wrist; [facility], [place]   IR ANGIO INTRA EXTRACRAN SEL INTERNAL CAROTID BILAT MOD SED  02/06/2022   IR ANGIO VERTEBRAL SEL VERTEBRAL BILAT MOD SED  02/06/2022   IR IMAGING GUIDED PORT INSERTION  09/10/2020   TUBAL LIGATION     [facility]   UMBILICAL HERNIA REPAIR  2005   [place]   Patient Active Problem List   Diagnosis Date Noted   Pain in right shoulder 02/02/2022   Carotid aneurysm, right ([place]) 12/22/2021   Muscle spasm of right leg 12/08/2021   Chemotherapy-induced neuropathy ([place]) 11/14/2021    Family history of breast cancer    Family history of liver cancer    Malignant neoplasm of 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, estrogen receptor positive ([place]) 08/25/2020   Abnormal chest CT 01/30/2019   History of pericarditis 01/30/2019   Pericarditis 01/22/2019   Chest pain 12/26/2018   History of endometrial ablation 01/21/2018   Family history of colon cancer 09/24/2017   Poor diet 01/24/2017   Weight loss, abnormal 01/24/2017   Smoker 09/22/2016   Mixed hyperlipidemia 08/07/2016   Irregular intermenstrual bleeding 08/07/2016   Depression 08/07/2016   Fat necrosis of peritoneum ([place]) 08/07/2016   Pelvic congestion syndrome 08/07/2016    REFERRING DIAG: M25.511,G89.29 (ICD-10-CM) - Chronic right shoulder pain   THERAPY DIAG:  Chronic right shoulder pain  Muscle weakness (generalized)  PERTINENT HISTORY: S/P axillary lymph node dissection for breast carcinoma 10 months ago; currently in remission from Rt breast cancer.  Recent subacromial injection   PRECAUTIONS: None  SUBJECTIVE:  Patient reports the shoulder is feeling good today without reports of pain. She has surgery scheduled on 03/03/22 for embolization of aneurysm.   PAIN:  Are you having pain? No Worst NPRS scale: 7/10 Pain location: anterolateral Rt shoulder  PAIN TYPE:sharp, pinch  Pain description: intermittent  Aggravating factors: reaching  Relieving factors: unknown    OBJECTIVE:   *Unless otherwise noted all objective measures were captured on initial evaluation.   DIAGNOSTIC FINDINGS:  MRI IMPRESSION: 1. Severe tendinosis of the supraspinatus tendon with a high-grade partial-thickness articular surface tear and subcortical reactive marrow changes. 2. Moderate tendinosis of the infraspinatus tendon. 3. Mild tendinosis of the subscapularis tendon. 4. Moderate tendinosis of the intra-articular portion of the long head of the biceps tendon. 5. Mild subacromial/subdeltoid bursitis.    PATIENT SURVEYS:  Quick Dash 77% disability                               SENSATION:          She reports lack of sensation along axilla secondary to lymph node removal.            POSTURE: Forward head/rounded shoulders    PALPATION: Diffuse tenderness about anterolateral shoulder       UPPER EXTREMITY AROM/PROM: left shoulder AROM WNL   A/PROM Right 01/25/2022 Left 01/25/2022 02/13/22 Right   Shoulder flexion 110 AROM 140 PROM empty end feel    150 AROM  Shoulder extension       Shoulder abduction 95 AROM 100 PROM empty end feel      Shoulder adduction       Shoulder internal rotation 50 AROM 50 PROM empty end feel     Shoulder external rotation 55 AROM 90 PROM Firm end feel      Elbow flexion       Elbow extension       Wrist flexion       Wrist extension       Wrist ulnar deviation       Wrist radial deviation       Wrist pronation       Wrist supination       (Blank rows = not tested)   UPPER EXTREMITY MMT: Pain with all Rt shoulder MMT   MMT Right 01/25/2022 Left 01/25/2022  Shoulder flexion 3+/5 (in available range) 4+/5  Shoulder extension      Shoulder abduction 4-/5 (in available range) 5/5  Shoulder adduction      Shoulder internal rotation 4-/5 5/5  Shoulder external rotation 4/5 5/5  Middle trapezius      Lower trapezius      Elbow flexion 4-/5 5/5  Elbow extension 4/5 5/5  Wrist flexion      Wrist extension      Wrist ulnar deviation      Wrist radial deviation      Wrist pronation      Wrist supination      Grip strength (lbs)      (Blank rows = not tested)   SHOULDER SPECIAL TESTS:           (+) Neers (+) Michel Bickers (+) Empty Can (-) Nyoka Lint               TODAY'S TREATMENT:  [facility] Adult PT Treatment:                                                DATE: 02/15/22 Therapeutic Exercise: UBE level 1 x 4 minutes fwd Pulleys 2 min each flex/scaption  Sidelying shoulder abduction 2 x 10  Rt Sidelying ER 2 x 10 Rt  Sidelying shoulder  flexion 2 x 10 Rt   Manual Therapy:  STM/Trigger point release Rt latissimus dorsi, pectorals, and subscapularis, infraspinatus, teres major/minor   Gentle scar tissue mobilization   Rt scapular mobilizations inferior/superior, upward/downward rotation     [facility] Adult PT Treatment:                                                DATE: 02/13/22 Therapeutic Exercise: Pulleys 2 min each flex/scaption Supine shoulder flexion AAROM 1 x 10  Chest press with dowel 1 x 10  Supine shoulder abduction AAROM 1 x 10  Scapular retraction 1 x 10  Bilateral shoulder ER yellow band 1 x 10  Manual Therapy: Rt shoulder PROM to tolerance all planes Rt shoulder GHJ mobilizations grade II-III inferior, anterior and posterior         PATIENT EDUCATION: Education details: N/A Person educated: n/a Education method: n/a Education comprehension: n/a     HOME EXERCISE PROGRAM: Access Code: [email or web address]   ASSESSMENT:   CLINICAL IMPRESSION: Patient tolerated session well today with progression of shoulder ROM and strengthening. She is hypersensitive to palpation about scar tissue, tolerating gentle scar tissue mobilization. Significant tautness and tenderness present about Rt lat and pec major/minor with partial release from manual therapy. She initially reports pain about middle deltoid with sidelying shoulder abduction, which was abolished following trigger point release to posterior rotator cuff. She quickly fatigues with all shoulder strengthening today.     REHAB POTENTIAL: Fair     CLINICAL DECISION MAKING: Stable/uncomplicated   EVALUATION COMPLEXITY: Low     GOALS: Goals reviewed with patient? No   SHORT TERM GOALS:   STG Name Target Date Goal status  1 Patient will be independent and compliant with initial HEP.    Baseline:  02/08/2022 INITIAL    LONG TERM GOALS:    LTG Name Target Date Goal status  1 Patient will demonstrate at least 150 degrees of Rt shoulder flexion AROM to improve  ability to reach overhead.  Baseline: 03/08/2022 INITIAL  2 Patient will demonstrate at least 60 degrees of Rt shoulder IR AROM to improve ability to complete self-care activities.  Baseline: 03/08/2022 INITIAL  3 Patient will demonstrate 4/5 Rt shoulder strength to improve ability to lift items. Baseline: 03/08/2022 INITIAL  4 Patient will demonstrate 5/5 Rt elbow strength to improve ability to carry groceries.  Baseline: 03/08/2022 INITIAL  5 Patient will score <50% disability on QuickDASH to signify clinically meaningful improvement in functional abilities.  03/07/22 Initial     PLAN: PT FREQUENCY: 2x/week   PT DURATION: 6 weeks   PLANNED INTERVENTIONS: Therapeutic exercises, Therapeutic activity, Neuro Muscular re-education, Patient/Family education, Joint mobilization, Aquatic Therapy, Dry Needling, Cryotherapy, Moist heat, Taping, Vasopneumatic device, and Manual therapy   PLAN FOR NEXT SESSION: update HEP, Rt shoulder ROM and strengthening       Gwendolyn Grant, PT, DPT, ATC 02/15/22 5:02 PM

## 2022-02-15 ENCOUNTER — Ambulatory Visit: Payer: Medicaid Other

## 2022-02-15 ENCOUNTER — Other Ambulatory Visit: Payer: Self-pay

## 2022-02-15 ENCOUNTER — Other Ambulatory Visit (HOSPITAL_COMMUNITY): Payer: Self-pay | Admitting: Neurosurgery

## 2022-02-15 DIAGNOSIS — G8929 Other chronic pain: Secondary | ICD-10-CM

## 2022-02-15 DIAGNOSIS — M6281 Muscle weakness (generalized): Secondary | ICD-10-CM | POA: Diagnosis not present

## 2022-02-15 DIAGNOSIS — M25511 Pain in right shoulder: Secondary | ICD-10-CM | POA: Diagnosis not present

## 2022-02-15 DIAGNOSIS — I671 Cerebral aneurysm, nonruptured: Secondary | ICD-10-CM

## 2022-02-20 ENCOUNTER — Other Ambulatory Visit: Payer: Self-pay | Admitting: Neurosurgery

## 2022-02-20 NOTE — Therapy (Signed)
OUTPATIENT PHYSICAL THERAPY TREATMENT NOTE   Patient Name: Amanda Rich MRN: [id number] DOB:Jul 09, 1966, 56 y.o., female Today's Date: 02/21/2022  PCP: Marcellina Millin REFERRING PROVIDER: Davy Pique   PT End of Session - 02/21/22 1528     Visit Number 4    Number of Visits 13    Date for PT Re-Evaluation 03/11/22    Authorization Type [employer]    Authorization Time Period 2/3-04/24/22    Authorization - Visit Number 3    Authorization - Number of Visits 12    PT Start Time 1532    PT Stop Time 0932    PT Time Calculation (min) 41 min    Activity Tolerance Patient tolerated treatment well    Behavior During Therapy WFL for tasks assessed/performed               Past Medical History:  Diagnosis Date   Anemia    Cancer (Person)    breast cancer   Depression    Family history of breast cancer    Family history of liver cancer    History of radiation therapy 03/10/21-04/11/21   Right breast- Dr. Gery Pray   Pericarditis    Smoker    Umbilical hernia    Past Surgical History:  Procedure Laterality Date   ABLATION ON ENDOMETRIOSIS     BREAST LUMPECTOMY WITH RADIOACTIVE SEED AND SENTINEL LYMPH NODE BIOPSY Right 02/02/2021   Procedure: RIGHT BREAST LUMPECTOMY WITH RADIOACTIVE SEED AND RIGHT SENTINEL LYMPH NODE MAPPING;  Surgeon: [name], MD;  Location: [place];  Service: General;  Laterality: Right;   GANGLION CYST EXCISION  2007   L wrist; [facility], [place]   IR ANGIO INTRA EXTRACRAN SEL INTERNAL CAROTID BILAT MOD SED  02/06/2022   IR ANGIO VERTEBRAL SEL VERTEBRAL BILAT MOD SED  02/06/2022   IR IMAGING GUIDED PORT INSERTION  09/10/2020   TUBAL LIGATION     [facility]   UMBILICAL HERNIA REPAIR  2005   [place]   Patient Active Problem List   Diagnosis Date Noted   Pain in right shoulder 02/02/2022   Carotid aneurysm, right ([place]) 12/22/2021   Muscle spasm of right leg 12/08/2021   Chemotherapy-induced neuropathy ([place]) 11/14/2021    Family history of breast cancer    Family history of liver cancer    Malignant neoplasm of 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, estrogen receptor positive ([place]) 08/25/2020   Abnormal chest CT 01/30/2019   History of pericarditis 01/30/2019   Pericarditis 01/22/2019   Chest pain 12/26/2018   History of endometrial ablation 01/21/2018   Family history of colon cancer 09/24/2017   Poor diet 01/24/2017   Weight loss, abnormal 01/24/2017   Smoker 09/22/2016   Mixed hyperlipidemia 08/07/2016   Irregular intermenstrual bleeding 08/07/2016   Depression 08/07/2016   Fat necrosis of peritoneum ([place]) 08/07/2016   Pelvic congestion syndrome 08/07/2016    REFERRING DIAG: M25.511,G89.29 (ICD-10-CM) - Chronic right shoulder pain   THERAPY DIAG:  Chronic right shoulder pain  Muscle weakness (generalized)  PERTINENT HISTORY: S/P axillary lymph node dissection for breast carcinoma 10 months ago; currently in remission from Rt breast cancer.  Recent subacromial injection   PRECAUTIONS: None  SUBJECTIVE:  Patient reports her shoulder is feeling good without pain currently. She reports compliance with HEP.   PAIN:  Are you having pain? No Worst NPRS scale: 7/10 Pain location: anterolateral Rt shoulder  PAIN TYPE:sharp, pinch  Pain description: intermittent  Aggravating factors:  reaching  Relieving factors: unknown    OBJECTIVE:   *Unless otherwise noted all objective measures were captured on initial evaluation.   DIAGNOSTIC FINDINGS:  MRI IMPRESSION: 1. Severe tendinosis of the supraspinatus tendon with a high-grade partial-thickness articular surface tear and subcortical reactive marrow changes. 2. Moderate tendinosis of the infraspinatus tendon. 3. Mild tendinosis of the subscapularis tendon. 4. Moderate tendinosis of the intra-articular portion of the long head of the biceps tendon. 5. Mild subacromial/subdeltoid bursitis.   PATIENT SURVEYS:  Quick Dash 77%  disability                               SENSATION:          She reports lack of sensation along axilla secondary to lymph node removal.            POSTURE: Forward head/rounded shoulders    PALPATION: Diffuse tenderness about anterolateral shoulder       UPPER EXTREMITY AROM/PROM: left shoulder AROM WNL   A/PROM Right 01/25/2022 Left 01/25/2022 02/13/22 Right  02/21/22  Shoulder flexion 110 AROM 140 PROM empty end feel    150 AROM 151 AROM Rt  Shoulder extension        Shoulder abduction 95 AROM 100 PROM empty end feel     120 AROM Rt  Shoulder adduction        Shoulder internal rotation 50 AROM 50 PROM empty end feel      Shoulder external rotation 55 AROM 90 PROM Firm end feel       Elbow flexion        Elbow extension        Wrist flexion        Wrist extension        Wrist ulnar deviation        Wrist radial deviation        Wrist pronation        Wrist supination        (Blank rows = not tested)   UPPER EXTREMITY MMT: Pain with all Rt shoulder MMT   MMT Right 01/25/2022 Left 01/25/2022  Shoulder flexion 3+/5 (in available range) 4+/5  Shoulder extension      Shoulder abduction 4-/5 (in available range) 5/5  Shoulder adduction      Shoulder internal rotation 4-/5 5/5  Shoulder external rotation 4/5 5/5  Middle trapezius      Lower trapezius      Elbow flexion 4-/5 5/5  Elbow extension 4/5 5/5  Wrist flexion      Wrist extension      Wrist ulnar deviation      Wrist radial deviation      Wrist pronation      Wrist supination      Grip strength (lbs)      (Blank rows = not tested)   SHOULDER SPECIAL TESTS:           (+) Neers (+) Michel Bickers (+) Empty Can (-) Nyoka Lint               TODAY'S TREATMENT:  [facility] Adult PT Treatment:                                                DATE: 02/21/22 Therapeutic Exercise: UBE level 2 x 4 minutes  Pulleys 2 min each flex/scaption  Reactive ER/IR isometrics 2 x 10 red band RUE Updated HEP Manual Therapy: STM  Rt latissimus dorsi, pectorals, subscapularis, deltoids Scar tissue mobilization    [facility] Adult PT Treatment:                                                DATE: 02/15/22 Therapeutic Exercise: UBE level 1 x 4 minutes fwd Pulleys 2 min each flex/scaption  Sidelying shoulder abduction 2 x 10 Rt Sidelying ER 2 x 10 Rt  Sidelying shoulder flexion 2 x 10 Rt   Manual Therapy:  STM/Trigger point release Rt latissimus dorsi, pectorals, and subscapularis, infraspinatus, teres major/minor   Gentle scar tissue mobilization   Rt scapular mobilizations inferior/superior, upward/downward rotation     [facility] Adult PT Treatment:                                                DATE: 02/13/22 Therapeutic Exercise: Pulleys 2 min each flex/scaption Supine shoulder flexion AAROM 1 x 10  Chest press with dowel 1 x 10  Supine shoulder abduction AAROM 1 x 10  Scapular retraction 1 x 10  Bilateral shoulder ER yellow band 1 x 10  Manual Therapy: Rt shoulder PROM to tolerance all planes Rt shoulder GHJ mobilizations grade II-III inferior, anterior and posterior         PATIENT EDUCATION: Education details: see treatment  Person educated: patient Education method: Systems developer, handout Education comprehension: returned demo     HOME EXERCISE PROGRAM: Access Code: [email or web address]   ASSESSMENT:   CLINICAL IMPRESSION: Improved tolerance to manual therapy compared to previous session with notable tautness/palpable tenderness about Rt lat and pectorals with partial release from manual therapy. Shoulder flexion AROM remains unchanged from last visit, though significant improvement in shoulder abduction AROM compared to initial evaluation. Light progression of shoulder strengthening, which she tolerated well without reports of pain, though quickly fatigues with rotator cuff strengthening.    REHAB POTENTIAL: Fair     CLINICAL DECISION MAKING: Stable/uncomplicated   EVALUATION COMPLEXITY: Low     GOALS: Goals  reviewed with patient? No   SHORT TERM GOALS:   STG Name Target Date Goal status  1 Patient will be independent and compliant with initial HEP.    Baseline:  02/08/2022 INITIAL    LONG TERM GOALS:    LTG Name Target Date Goal status  1 Patient will demonstrate at least 150 degrees of Rt shoulder flexion AROM to improve ability to reach overhead.  Baseline: 03/08/2022 INITIAL  2 Patient will demonstrate at least 60 degrees of Rt shoulder IR AROM to improve ability to complete self-care activities.  Baseline: 03/08/2022 INITIAL  3 Patient will demonstrate 4/5 Rt shoulder strength to improve ability to lift items. Baseline: 03/08/2022 INITIAL  4 Patient will demonstrate 5/5 Rt elbow strength to improve ability to carry groceries.  Baseline: 03/08/2022 INITIAL  5 Patient will score <50% disability on QuickDASH to signify clinically meaningful improvement in functional abilities.  03/07/22 Initial     PLAN: PT FREQUENCY: 2x/week   PT DURATION: 6 weeks   PLANNED INTERVENTIONS: Therapeutic exercises, Therapeutic activity, Neuro Muscular re-education, Patient/Family education, Joint mobilization, Aquatic Therapy, Dry Needling, Cryotherapy, Moist heat, Taping,  Vasopneumatic device, and Manual therapy   PLAN FOR NEXT SESSION: update HEP, Rt shoulder ROM and strengthening       Gwendolyn Grant, PT, DPT, ATC 02/21/22 4:15 PM

## 2022-02-21 ENCOUNTER — Inpatient Hospital Stay: Payer: Medicaid Other | Attending: Hematology and Oncology | Admitting: Internal Medicine

## 2022-02-21 ENCOUNTER — Other Ambulatory Visit: Payer: Self-pay

## 2022-02-21 ENCOUNTER — Ambulatory Visit: Payer: Medicaid Other

## 2022-02-21 ENCOUNTER — Other Ambulatory Visit (HOSPITAL_COMMUNITY): Payer: Self-pay

## 2022-02-21 VITALS — BP 117/79 | HR 77 | Temp 97.9°F | Resp 16 | Wt 125.8 lb

## 2022-02-21 DIAGNOSIS — Z170 Estrogen receptor positive status [ER+]: Secondary | ICD-10-CM | POA: Diagnosis not present

## 2022-02-21 DIAGNOSIS — C50211 Malignant neoplasm of upper-inner quadrant of right female breast: Secondary | ICD-10-CM | POA: Insufficient documentation

## 2022-02-21 DIAGNOSIS — Z79811 Long term (current) use of aromatase inhibitors: Secondary | ICD-10-CM | POA: Diagnosis not present

## 2022-02-21 DIAGNOSIS — M6281 Muscle weakness (generalized): Secondary | ICD-10-CM

## 2022-02-21 DIAGNOSIS — M25511 Pain in right shoulder: Secondary | ICD-10-CM | POA: Diagnosis not present

## 2022-02-21 DIAGNOSIS — T451X5A Adverse effect of antineoplastic and immunosuppressive drugs, initial encounter: Secondary | ICD-10-CM

## 2022-02-21 DIAGNOSIS — G620 Drug-induced polyneuropathy: Secondary | ICD-10-CM

## 2022-02-21 DIAGNOSIS — G8929 Other chronic pain: Secondary | ICD-10-CM

## 2022-02-21 MED ORDER — PREGABALIN 75 MG PO CAPS
75.0000 mg | ORAL_CAPSULE | Freq: Two times a day (BID) | ORAL | 3 refills | Status: DC
  Filled 2022-02-21: qty 60, 30d supply, fill #0

## 2022-02-21 NOTE — Progress Notes (Signed)
[facility] [place], [place] [address] [phone]   Interval Evaluation  Date of Service: 02/21/22 Patient Name: Amanda Rich Patient MRN: [id number] Patient DOB: [birth year] Provider: Ventura Sellers, MD  Identifying Statement:  Amanda Rich is a 56 y.o. female with Chemotherapy-induced neuropathy ([place])   Primary Cancer:  Oncologic History: Oncology History  Malignant neoplasm of 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, estrogen receptor positive ([place])  08/25/2020 Initial Diagnosis   Patient palpated a right breast lump x60yrand a left breast lump x212yrDiagnostic mammogram and [place]ore[place]owed in the right breast, a 2.7cm mass 5cm from the nipple and 0.8cm mass 1cm from the nipple at the 12:30 position, with borderline cortical thickening in the right axilla, and in the left breast, a 4.0cm mass at the 11 o'clock position representing a hamartoma. 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   09/13/2020 - 01/18/2021 Chemotherapy   [facility]        02/02/2021 Surgery   Right lumpectomy ([place]): no residual carcinoma, 4 right axillary lymph nodes negative for carcinoma.   02/21/2021 - 08/30/2021 Chemotherapy      Patient is on Antibody Plan: BREAST TRASTUZUMAB + PERTUZUMAB Q21D     03/11/2021 - 04/11/2021 Radiation Therapy   Adjuvant radiation   09/2021 -  Anti-estrogen oral therapy   Anastrozole daily     Interval History: Amanda SHARROWresents today for follow up.  Neuropathy is well controlled with the gabapentin (30052mwice per day), but it makes her feel drowsy and disconnected.  This limits her ability to drive and get to work safely, which is a major concern right now.  She is applying for disability but has not met approval to  this point.  Baclofen continues to help with the spasms.  Depression is more present in recent days and weeks.  H+P (presents today to review neuropathic symptoms.  She describes new onset of pain, burning, tingling affecting the feet and lower aspects of both legs.  Onset was during chemotherapy for breast cancer this past year which involved taxol based regimen.  She also describes cramping symptoms affecting her hands and feet which have accompanied the pain in recent weeks.  Has been dosing gabapentin 300m23mD which has helped "a little". Currently on hormone therapy only, denies history of diabetes or alcohol abuse.  Medications: Current Outpatient Medications on File Prior to Visit  Medication Sig Dispense Refill   albuterol (PROAIR HFA) 108 (90 Base) MCG/ACT inhaler Inhale 2 puffs into the lungs every 6 (six) hours as needed for wheezing or shortness of breath. 18 g 0   anastrozole (ARIMIDEX) 1 MG tablet Take 1 tablet (1 mg total) by mouth daily. 90 tablet 3   aspirin 81 MG chewable tablet Chew 1 tablet by mouth daily (start taking 7 days prior to scheduled procedure) 30 tablet 6   baclofen (LIORESAL) 10 MG tablet Take 1 tablet (10 mg total) by mouth 3 (three) times daily as needed for muscle spasms. 60 each 0   budesonide-formoterol (SYMBICORT) 80-4.5 MCG/ACT inhaler Inhale 2 puffs into the lungs in the morning and at bedtime. 10.2 g 12   CALCIUM PO Take by mouth.     cholecalciferol (VITAMIN D3) 25 MCG (1000 UNIT) tablet Take  2 tablets (2,000 Units total) by mouth daily. 90 tablet 0   citalopram (CELEXA) 10 MG tablet Take 1 tablet (10 mg total) by mouth daily. 90 tablet 3   diphenhydrAMINE (BENADRYL) 25 mg capsule Take 2 capsules by mouth 1 hour prior to scheduled procedure 2 capsule 0   gabapentin (NEURONTIN) 300 MG capsule Take 1 capsule (300 mg total) by mouth 3 (three) times daily. 180 capsule 2   nicotine (NICODERM CQ - DOSED IN MG/24 HR) 7 mg/24hr patch PLACE 1 PATCH ONTO THE SKIN  DAILY. 84 patch 0   predniSONE (DELTASONE) 50 MG tablet Take 1 tablet by mouth 13, 7, and 1 hour prior to scheduled procedure 3 tablet 0   ticagrelor (BRILINTA) 90 MG TABS tablet Take 1 tablet (90 mg total) by mouth 2 (two) times daily. 60 tablet 6   Tiotropium Bromide Monohydrate (SPIRIVA RESPIMAT) 2.5 MCG/ACT AERS Inhale 2 puffs into the lungs daily. 4 g 6   [DISCONTINUED] prochlorperazine (COMPAZINE) 10 MG tablet Take 1 tablet (10 mg total) by mouth every 6 (six) hours as needed (Nausea or vomiting). 30 tablet 1   No current facility-administered medications on file prior to visit.    Allergies:  Allergies  Allergen Reactions   Clarithromycin Swelling    Biaxin   Ivp Dye [Iodinated Contrast Media] Rash   Past Medical History:  Past Medical History:  Diagnosis Date   Anemia    Cancer ([place])    breast cancer   Depression    Family history of breast cancer    Family history of liver cancer    History of radiation therapy 03/10/21-04/11/21   Right breast- Dr. Gery Pray   Pericarditis    Smoker    Umbilical hernia    Past Surgical History:  Past Surgical History:  Procedure Laterality Date   ABLATION ON ENDOMETRIOSIS     BREAST LUMPECTOMY WITH RADIOACTIVE SEED AND SENTINEL LYMPH NODE BIOPSY Right 02/02/2021   Procedure: RIGHT BREAST LUMPECTOMY WITH RADIOACTIVE SEED AND RIGHT SENTINEL LYMPH NODE MAPPING;  Surgeon: [name], MD;  Location: [facility];  Service: General;  Laterality: Right;   GANGLION CYST EXCISION  2007   L wrist; [facility], [place]   IR ANGIO INTRA EXTRACRAN SEL INTERNAL CAROTID BILAT MOD SED  02/06/2022   IR ANGIO VERTEBRAL SEL VERTEBRAL BILAT MOD SED  02/06/2022   IR IMAGING GUIDED PORT INSERTION  09/10/2020   TUBAL LIGATION     [facility]   UMBILICAL HERNIA REPAIR  2005   [place]   Social History:  Social History   Socioeconomic History   Marital status: Divorced    Spouse name: Not on file   Number of children: 3   Years of education: Not on  file   Highest education level: Associate degree: academic program  Occupational History   Not on file  Tobacco Use   Smoking status: Some Days    Packs/day: 1.00    Years: 25.00    Pack years: 25.00    Types: Cigarettes   Smokeless tobacco: Never   Tobacco comments:    Smoking .5 ppd- 09/22/21  Vaping Use   Vaping Use: Never used  Substance and Sexual Activity   Alcohol use: Yes    Alcohol/week: 1.0 standard drink    Types: 1 Glasses of wine per week   Drug use: No   Sexual activity: Yes    Birth control/protection: Post-menopausal  Other Topics Concern   Not on file  Social History Narrative  Not on file   Social Determinants of Health   Financial Resource Strain: Not on file  Food Insecurity: Not on file  Transportation Needs: Not on file  Physical Activity: Not on file  Stress: Not on file  Social Connections: Not on file  Intimate Partner Violence: Not on file   Family History:  Family History  Problem Relation Age of Onset   Diabetes Mother    Hypertension Mother    Liver cancer Daughter 70   Diabetes Maternal Aunt    Breast cancer Maternal Aunt 87   Colon cancer Maternal Uncle 40       or prostate cancer   Colon cancer Cousin    Breast cancer Cousin    Autism Half-Brother    Breast cancer Other 25       maternal second cousin   Diabetes Paternal Uncle    Breast cancer Other        dx. 19s, paternal second cousin   Lung cancer Other        dx. 5s, paternal cousin once removed (father's cousin)    Review of Systems: Constitutional: Doesn't report fevers, chills or abnormal weight loss Eyes: Doesn't report blurriness of vision Ears, nose, mouth, throat, and face: Doesn't report sore throat Respiratory: Doesn't report cough, dyspnea or wheezes Cardiovascular: Doesn't report palpitation, chest discomfort  Gastrointestinal:  Doesn't report nausea, constipation, diarrhea GU: Doesn't report incontinence Skin: Doesn't report skin rashes Neurological:  Per HPI Musculoskeletal: Doesn't report joint pain Behavioral/Psych: Doesn't report anxiety  Physical Exam: Vitals:   02/21/22 0907  BP: 117/79  Pulse: 77  Resp: 16  Temp: 97.9 F (36.6 C)  SpO2: 100%   KPS: 80. General: Alert, cooperative, pleasant, in no acute distress Head: Normal EENT: No conjunctival injection or scleral icterus.  Lungs: Resp effort normal Cardiac: Regular rate Abdomen: Non-distended abdomen Skin: No rashes cyanosis or petechiae. Extremities: No clubbing or edema  Neurologic Exam: Mental Status: Awake, alert, attentive to examiner. Oriented to self and environment. Language is fluent with intact comprehension.  Cranial Nerves: Visual acuity is grossly normal. Visual fields are full. Extra-ocular movements intact. No ptosis. Face is symmetric Motor: Tone and bulk are normal. Power is full in both arms and legs. Reflexes are symmetric, no pathologic reflexes present.  Sensory: Stocking sensory impairment Gait: Mild sensory dystaxia   Labs: I have reviewed the data as listed    Component Value Date/Time   NA 137 02/06/2022 0728   K 4.2 02/06/2022 0728   CL 104 02/06/2022 0728   CO2 24 02/06/2022 0728   GLUCOSE 161 (H) 02/06/2022 0728   BUN 12 02/06/2022 0728   CREATININE 0.74 02/06/2022 0728   CREATININE 0.76 10/27/2021 1447   CREATININE 0.68 09/24/2017 0827   CALCIUM 9.6 02/06/2022 0728   PROT 7.3 10/27/2021 1447   ALBUMIN 3.9 10/27/2021 1447   AST 18 10/27/2021 1447   ALT 10 10/27/2021 1447   ALKPHOS 69 10/27/2021 1447   BILITOT 0.4 10/27/2021 1447   GFRNONAA >60 02/06/2022 0728   GFRNONAA >60 10/27/2021 1447   GFRNONAA >89 09/22/2016 1501   GFRAA >60 09/20/2020 1415   GFRAA >89 09/22/2016 1501   Lab Results  Component Value Date   WBC 5.4 02/06/2022   NEUTROABS 4.6 02/06/2022   HGB 12.5 02/06/2022   HCT 39.5 02/06/2022   MCV 89.8 02/06/2022   PLT 260 02/06/2022     Assessment/Plan Chemotherapy-induced neuropathy  (HCC)  Amanda Rich presents with clinical syndrome consistent with  symmetric, length dependent, small and large fiber peripheral neuropathy.  Etiology is exposure to chemotherapy.  Clinically her symptoms are well controlled on Gabapentin 312m BID, but the drug leads to mild encephalopathy which limits her ability to drive independently and work.  We discussed a trial of Lyrica 749mBID as a substitute for Gabapentin, in the hope that this side effect can be avoided with therapeutic benefit.  Will continue to follow with Dr. NuKathyrn Sheriffor her aneurysm.  We encouraged her to reach out to a counselor or therapist to work through depression symptoms in addition to her medications.  We appreciate the opportunity to participate in the care of Amanda Rich She will return to clinic in 2 months for further titration of medications.  All questions were answered. The patient knows to call the clinic with any problems, questions or concerns. No barriers to learning were detected.  The total time spent in the encounter was 30 minutes and more than 50% was on counseling and review of test results   ZaVentura SellersMD Medical Director of Neuro-Oncology [facility] We[facility]ico2/28/23 9:02 AM

## 2022-02-22 ENCOUNTER — Telehealth: Payer: Self-pay | Admitting: Internal Medicine

## 2022-02-22 DIAGNOSIS — Z419 Encounter for procedure for purposes other than remedying health state, unspecified: Secondary | ICD-10-CM | POA: Diagnosis not present

## 2022-02-22 NOTE — Therapy (Signed)
OUTPATIENT PHYSICAL THERAPY TREATMENT NOTE   Patient Name: Amanda Rich MRN: [id number] DOB:04-22-1966, 56 y.o., female Today's Date: 02/23/2022  PCP: Marcellina Millin REFERRING PROVIDER: Garald Balding, MD   PT End of Session - 02/23/22 1533     Visit Number 5    Number of Visits 13    Date for PT Re-Evaluation 03/11/22    Authorization Type [employer]    Authorization Time Period 2/3-04/24/22    Authorization - Visit Number 4    Authorization - Number of Visits 12    PT Start Time 0092    PT Stop Time 1612    PT Time Calculation (min) 39 min    Activity Tolerance Patient tolerated treatment well    Behavior During Therapy WFL for tasks assessed/performed                Past Medical History:  Diagnosis Date   Anemia    Cancer ([place])    breast cancer   Depression    Family history of breast cancer    Family history of liver cancer    History of radiation therapy 03/10/21-04/11/21   Right breast- Dr. Gery Pray   Pericarditis    Smoker    Umbilical hernia    Past Surgical History:  Procedure Laterality Date   ABLATION ON ENDOMETRIOSIS     BREAST LUMPECTOMY WITH RADIOACTIVE SEED AND SENTINEL LYMPH NODE BIOPSY Right 02/02/2021   Procedure: RIGHT BREAST LUMPECTOMY WITH RADIOACTIVE SEED AND RIGHT SENTINEL LYMPH NODE [place];  Surgeon: [name], MD;  Location: [place];  Service: General;  Laterality: Right;   GANGLION CYST EXCISION  2007   L wrist; [facility], [place]   IR ANGIO INTRA EXTRACRAN SEL INTERNAL CAROTID BILAT MOD SED  02/06/2022   IR ANGIO VERTEBRAL SEL VERTEBRAL BILAT MOD SED  02/06/2022   IR IMAGING GUIDED PORT INSERTION  09/10/2020   TUBAL LIGATION     [facility]   UMBILICAL HERNIA REPAIR  2005   [place]   Patient Active Problem List   Diagnosis Date Noted   Pain in right shoulder 02/02/2022   Carotid aneurysm, right ([place]) 12/22/2021   Muscle spasm of right leg 12/08/2021   Chemotherapy-induced neuropathy ([place]) 11/14/2021    Family history of breast cancer    Family history of liver cancer    Malignant neoplasm of 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, estrogen receptor positive ([place]) 08/25/2020   Abnormal chest CT 01/30/2019   History of pericarditis 01/30/2019   Pericarditis 01/22/2019   Chest pain 12/26/2018   History of endometrial ablation 01/21/2018   Family history of colon cancer 09/24/2017   Poor diet 01/24/2017   Weight loss, abnormal 01/24/2017   Smoker 09/22/2016   Mixed hyperlipidemia 08/07/2016   Irregular intermenstrual bleeding 08/07/2016   Depression 08/07/2016   Fat necrosis of peritoneum ([place]) 08/07/2016   Pelvic congestion syndrome 08/07/2016    REFERRING DIAG: M25.511,G89.29 (ICD-10-CM) - Chronic right shoulder pain   THERAPY DIAG:  Chronic right shoulder pain  Muscle weakness (generalized)  PERTINENT HISTORY: S/P axillary lymph node dissection for breast carcinoma 10 months ago; currently in remission from Rt breast cancer.  Recent subacromial injection   PRECAUTIONS: None  SUBJECTIVE:  Patient reports the shoulder is feeling good without pain currently. She reports the only area of pain is around her scar now.   PAIN:  Are you having pain? No Worst NPRS scale: 7/10 Pain location: anterolateral Rt shoulder  PAIN TYPE:sharp,  pinch  Pain description: intermittent  Aggravating factors: reaching  Relieving factors: unknown    OBJECTIVE:   *Unless otherwise noted all objective measures were captured on initial evaluation.   DIAGNOSTIC FINDINGS:  MRI IMPRESSION: 1. Severe tendinosis of the supraspinatus tendon with a high-grade partial-thickness articular surface tear and subcortical reactive marrow changes. 2. Moderate tendinosis of the infraspinatus tendon. 3. Mild tendinosis of the subscapularis tendon. 4. Moderate tendinosis of the intra-articular portion of the long head of the biceps tendon. 5. Mild subacromial/subdeltoid bursitis.   PATIENT  SURVEYS:  Quick Dash 77% disability                               SENSATION:          She reports lack of sensation along axilla secondary to lymph node removal.            POSTURE: Forward head/rounded shoulders    PALPATION: Diffuse tenderness about anterolateral shoulder       UPPER EXTREMITY AROM/PROM: left shoulder AROM WNL   A/PROM Right 01/25/2022 Left 01/25/2022 02/13/22 Right  02/21/22  Shoulder flexion 110 AROM 140 PROM empty end feel    150 AROM 151 AROM Rt  Shoulder extension        Shoulder abduction 95 AROM 100 PROM empty end feel     120 AROM Rt  Shoulder adduction        Shoulder internal rotation 50 AROM 50 PROM empty end feel      Shoulder external rotation 55 AROM 90 PROM Firm end feel       Elbow flexion        Elbow extension        Wrist flexion        Wrist extension        Wrist ulnar deviation        Wrist radial deviation        Wrist pronation        Wrist supination        (Blank rows = not tested)   UPPER EXTREMITY MMT: Pain with all Rt shoulder MMT   MMT Right 01/25/2022 Left 01/25/2022  Shoulder flexion 3+/5 (in available range) 4+/5  Shoulder extension      Shoulder abduction 4-/5 (in available range) 5/5  Shoulder adduction      Shoulder internal rotation 4-/5 5/5  Shoulder external rotation 4/5 5/5  Middle trapezius      Lower trapezius      Elbow flexion 4-/5 5/5  Elbow extension 4/5 5/5  Wrist flexion      Wrist extension      Wrist ulnar deviation      Wrist radial deviation      Wrist pronation      Wrist supination      Grip strength (lbs)      (Blank rows = not tested)   SHOULDER SPECIAL TESTS:           (+) Neers (+) Michel Bickers (+) Empty Can (-) Nyoka Lint               TODAY'S TREATMENT:  [facility] Adult PT Treatment:                                                DATE: 02/23/22  Therapeutic Exercise: UBE level 2 x 4 minutes  Sleeper stretch RUE 3 x 30 sec Sidelying shoulder flexion RUE 2 x 10  Sidelying shoulder  ER RUE 2 x 10; 1 lb  Supine shoulder flexion with stability ball 2 x 10  Serratus punch with stability ball 2 x 15 Manual Therapy: Rt shoulder PROM in all planes to tolerance Rt GHJ mobilizations grade II-III inferior, anterior, and posterior     [facility] Adult PT Treatment:                                                DATE: 02/21/22 Therapeutic Exercise: UBE level 2 x 4 minutes  Pulleys 2 min each flex/scaption  Reactive ER/IR isometrics 2 x 10 red band RUE Updated HEP Manual Therapy: STM Rt latissimus dorsi, pectorals, subscapularis, deltoids Scar tissue mobilization    [facility] Adult PT Treatment:                                                DATE: 02/15/22 Therapeutic Exercise: UBE level 1 x 4 minutes fwd Pulleys 2 min each flex/scaption  Sidelying shoulder abduction 2 x 10 Rt Sidelying ER 2 x 10 Rt  Sidelying shoulder flexion 2 x 10 Rt   Manual Therapy:  STM/Trigger point release Rt latissimus dorsi, pectorals, and subscapularis, infraspinatus, teres major/minor   Gentle scar tissue mobilization   Rt scapular mobilizations inferior/superior, upward/downward rotation           PATIENT EDUCATION: Education details: n/a  Person educated: n/a Education method: n/a Education comprehension: n/a     HOME EXERCISE PROGRAM: Access Code: [email or web address]   ASSESSMENT:   CLINICAL IMPRESSION: Overall good tolerance to Rt shoulder PROM with most notable limitation remaining into abduction secondary to pain. Continued with progression of Rt shoulder strengthening and ROM, which she tolerates fairly well reporting occasional discomfort about lateral shoulder with strengthening. She reports onset of fatigue fairly quickly with all shoulder strengthening, though is able to complete prescribed reps with good form.    REHAB POTENTIAL: Fair     CLINICAL DECISION MAKING: Stable/uncomplicated   EVALUATION COMPLEXITY: Low     GOALS: Goals reviewed with patient? No   SHORT TERM GOALS:    STG Name Target Date Goal status  1 Patient will be independent and compliant with initial HEP.    Baseline:  02/08/2022 Achieved     LONG TERM GOALS:    LTG Name Target Date Goal status  1 Patient will demonstrate at least 150 degrees of Rt shoulder flexion AROM to improve ability to reach overhead.  Baseline: 03/08/2022 INITIAL  2 Patient will demonstrate at least 60 degrees of Rt shoulder IR AROM to improve ability to complete self-care activities.  Baseline: 03/08/2022 INITIAL  3 Patient will demonstrate 4/5 Rt shoulder strength to improve ability to lift items. Baseline: 03/08/2022 INITIAL  4 Patient will demonstrate 5/5 Rt elbow strength to improve ability to carry groceries.  Baseline: 03/08/2022 INITIAL  5 Patient will score <50% disability on QuickDASH to signify clinically meaningful improvement in functional abilities.  03/07/22 Initial     PLAN: PT FREQUENCY: 2x/week   PT DURATION: 6 weeks   PLANNED INTERVENTIONS: Therapeutic exercises, Therapeutic activity, Neuro Muscular re-education,  Patient/Family education, Joint mobilization, Aquatic Therapy, Dry Needling, Cryotherapy, Moist heat, Taping, Vasopneumatic device, and Manual therapy   PLAN FOR NEXT SESSION: Rt shoulder ROM and strengthening       Gwendolyn Grant, PT, DPT, ATC 02/23/22 4:13 PM

## 2022-02-22 NOTE — Telephone Encounter (Signed)
Scheduled per 2/28 los, message has been left with pt ?

## 2022-02-23 ENCOUNTER — Ambulatory Visit: Payer: Medicaid Other | Attending: Orthopaedic Surgery

## 2022-02-23 ENCOUNTER — Other Ambulatory Visit: Payer: Self-pay

## 2022-02-23 DIAGNOSIS — M6281 Muscle weakness (generalized): Secondary | ICD-10-CM | POA: Diagnosis not present

## 2022-02-23 DIAGNOSIS — G8929 Other chronic pain: Secondary | ICD-10-CM | POA: Diagnosis not present

## 2022-02-23 DIAGNOSIS — M25511 Pain in right shoulder: Secondary | ICD-10-CM | POA: Diagnosis not present

## 2022-02-27 NOTE — Therapy (Signed)
OUTPATIENT PHYSICAL THERAPY TREATMENT NOTE   Patient Name: Amanda Rich MRN: [id number] DOB:02-15-1966, 56 y.o., female Today's Date: 02/28/2022  PCP: Marcellina Millin REFERRING PROVIDER: Davy Pique   PT End of Session - 02/28/22 1533     Visit Number 6    Number of Visits 13    Date for PT Re-Evaluation 03/11/22    Authorization Type [employer]    Authorization Time Period 2/3-04/24/22    Authorization - Visit Number 5    Authorization - Number of Visits 12    PT Start Time 6295    PT Stop Time 2841    PT Time Calculation (min) 40 min    Activity Tolerance Patient tolerated treatment well    Behavior During Therapy WFL for tasks assessed/performed                 Past Medical History:  Diagnosis Date   Anemia    Cancer ([place])    breast cancer   Depression    Family history of breast cancer    Family history of liver cancer    History of radiation therapy 03/10/21-04/11/21   Right breast- Dr. Gery Pray   Pericarditis    Smoker    Umbilical hernia    Past Surgical History:  Procedure Laterality Date   ABLATION ON ENDOMETRIOSIS     BREAST LUMPECTOMY WITH RADIOACTIVE SEED AND SENTINEL LYMPH NODE BIOPSY Right 02/02/2021   Procedure: RIGHT BREAST LUMPECTOMY WITH RADIOACTIVE SEED AND RIGHT SENTINEL LYMPH NODE [place];  Surgeon: [name], MD;  Location: [place];  Service: General;  Laterality: Right;   GANGLION CYST EXCISION  2007   L wrist; [facility], [place]   IR ANGIO INTRA EXTRACRAN SEL INTERNAL CAROTID BILAT MOD SED  02/06/2022   IR ANGIO VERTEBRAL SEL VERTEBRAL BILAT MOD SED  02/06/2022   IR IMAGING GUIDED PORT INSERTION  09/10/2020   TUBAL LIGATION     [facility]   UMBILICAL HERNIA REPAIR  2005   [place]   Patient Active Problem List   Diagnosis Date Noted   Pain in right shoulder 02/02/2022   Carotid aneurysm, right ([place]) 12/22/2021   Muscle spasm of right leg 12/08/2021   Chemotherapy-induced neuropathy ([place])  11/14/2021   Family history of breast cancer    Family history of liver cancer    Malignant neoplasm of 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, estrogen receptor positive ([place]) 08/25/2020   Abnormal chest CT 01/30/2019   History of pericarditis 01/30/2019   Pericarditis 01/22/2019   Chest pain 12/26/2018   History of endometrial ablation 01/21/2018   Family history of colon cancer 09/24/2017   Poor diet 01/24/2017   Weight loss, abnormal 01/24/2017   Smoker 09/22/2016   Mixed hyperlipidemia 08/07/2016   Irregular intermenstrual bleeding 08/07/2016   Depression 08/07/2016   Fat necrosis of peritoneum ([place]) 08/07/2016   Pelvic congestion syndrome 08/07/2016    REFERRING DIAG: M25.511,G89.29 (ICD-10-CM) - Chronic right shoulder pain   THERAPY DIAG:  Chronic right shoulder pain  Muscle weakness (generalized)  PERTINENT HISTORY: S/P axillary lymph node dissection for breast carcinoma 10 months ago; currently in remission from Rt breast cancer.  Recent subacromial injection   PRECAUTIONS: None  SUBJECTIVE:  Patient reports the shoulder is feeling good. She was lifting items in her kitchen yesterday and had some soreness along her scar.   PAIN:  Are you having pain? No Worst NPRS scale: 7/10 Pain location: anterolateral Rt shoulder  PAIN  TYPE:sharp, pinch  Pain description: intermittent  Aggravating factors: reaching  Relieving factors: unknown    OBJECTIVE:   *Unless otherwise noted all objective measures were captured on initial evaluation.   DIAGNOSTIC FINDINGS:  MRI IMPRESSION: 1. Severe tendinosis of the supraspinatus tendon with a high-grade partial-thickness articular surface tear and subcortical reactive marrow changes. 2. Moderate tendinosis of the infraspinatus tendon. 3. Mild tendinosis of the subscapularis tendon. 4. Moderate tendinosis of the intra-articular portion of the long head of the biceps tendon. 5. Mild subacromial/subdeltoid bursitis.    PATIENT SURVEYS:  Quick Dash 77% disability                               SENSATION:          She reports lack of sensation along axilla secondary to lymph node removal.            POSTURE: Forward head/rounded shoulders    PALPATION: Diffuse tenderness about anterolateral shoulder       UPPER EXTREMITY AROM/PROM: left shoulder AROM WNL   A/PROM Right 01/25/2022 Left 01/25/2022 02/13/22 Right  02/21/22 02/28/22  Shoulder flexion 110 AROM 140 PROM empty end feel    150 AROM 151 AROM Rt   Shoulder extension         Shoulder abduction 95 AROM 100 PROM empty end feel     120 AROM Rt   Shoulder adduction         Shoulder internal rotation 50 AROM 50 PROM empty end feel     57 AROM  Shoulder external rotation 55 AROM 90 PROM Firm end feel      90 AROM  Elbow flexion         Elbow extension         Wrist flexion         Wrist extension         Wrist ulnar deviation         Wrist radial deviation         Wrist pronation         Wrist supination         (Blank rows = not tested)   UPPER EXTREMITY MMT: Pain with all Rt shoulder MMT   MMT Right 01/25/2022 Left 01/25/2022  Shoulder flexion 3+/5 (in available range) 4+/5  Shoulder extension      Shoulder abduction 4-/5 (in available range) 5/5  Shoulder adduction      Shoulder internal rotation 4-/5 5/5  Shoulder external rotation 4/5 5/5  Middle trapezius      Lower trapezius      Elbow flexion 4-/5 5/5  Elbow extension 4/5 5/5  Wrist flexion      Wrist extension      Wrist ulnar deviation      Wrist radial deviation      Wrist pronation      Wrist supination      Grip strength (lbs)      (Blank rows = not tested)   SHOULDER SPECIAL TESTS:           (+) Neers (+) Michel Bickers (+) Empty Can (-) Nyoka Lint               TODAY'S TREATMENT:  [facility] Adult PT Treatment:  DATE: 02/28/22 Therapeutic Exercise: UBE level 2 x 4 minutes  Resisted shoulder extension yellow band 3 x  10  Resisted rows 3 x 10 red band  Serratus wall slide 1 x 10  Manual Therapy: Rt shoulder PROM in all planes to tolerance Rt GHJ mobilizations grade II-III inferior, anterior, and posterior     [facility] Adult PT Treatment:                                                DATE: 02/23/22 Therapeutic Exercise: UBE level 2 x 4 minutes  Sleeper stretch RUE 3 x 30 sec Sidelying shoulder flexion RUE 2 x 10  Sidelying shoulder ER RUE 2 x 10; 1 lb  Supine shoulder flexion with stability ball 2 x 10  Serratus punch with stability ball 2 x 15 Manual Therapy: Rt shoulder PROM in all planes to tolerance Rt GHJ mobilizations grade II-III inferior, anterior, and posterior     [facility] Adult PT Treatment:                                                DATE: 02/21/22 Therapeutic Exercise: UBE level 2 x 4 minutes  Pulleys 2 min each flex/scaption  Reactive ER/IR isometrics 2 x 10 red band RUE Updated HEP Manual Therapy: STM Rt latissimus dorsi, pectorals, subscapularis, deltoids Scar tissue mobilization    [facility] Adult PT Treatment:                                                DATE: 02/15/22 Therapeutic Exercise: UBE level 1 x 4 minutes fwd Pulleys 2 min each flex/scaption  Sidelying shoulder abduction 2 x 10 Rt Sidelying ER 2 x 10 Rt  Sidelying shoulder flexion 2 x 10 Rt   Manual Therapy:  STM/Trigger point release Rt latissimus dorsi, pectorals, and subscapularis, infraspinatus, teres major/minor   Gentle scar tissue mobilization   Rt scapular mobilizations inferior/superior, upward/downward rotation           PATIENT EDUCATION: Education details: n/a  Person educated: n/a Education method: n/a Education comprehension: n/a     HOME EXERCISE PROGRAM: Access Code: [email or web address]   ASSESSMENT:   CLINICAL IMPRESSION: Patient demonstrates improvements in Rt shoulder ER/IR AROM, demonstrating normalized ER AROM at this time. She has significant difficulty initially performing resisted  shoulder extension as she has difficulty controlling concentric and eccentric phase of motion with light resistance and has excessive upper trap engagement. With continued practice and decreased range she is able to properly perform. Overall good tolerance to progression of strengthening without reports of pain.    REHAB POTENTIAL: Fair     CLINICAL DECISION MAKING: Stable/uncomplicated   EVALUATION COMPLEXITY: Low     GOALS: Goals reviewed with patient? No   SHORT TERM GOALS:   STG Name Target Date Goal status  1 Patient will be independent and compliant with initial HEP.    Baseline:  02/08/2022 Achieved     LONG TERM GOALS:    LTG Name Target Date Goal status  1 Patient will demonstrate at least 150 degrees of Rt shoulder flexion  AROM to improve ability to reach overhead.  Baseline: 03/08/2022 INITIAL  2 Patient will demonstrate at least 60 degrees of Rt shoulder IR AROM to improve ability to complete self-care activities.  Baseline: 03/08/2022 INITIAL  3 Patient will demonstrate 4/5 Rt shoulder strength to improve ability to lift items. Baseline: 03/08/2022 INITIAL  4 Patient will demonstrate 5/5 Rt elbow strength to improve ability to carry groceries.  Baseline: 03/08/2022 INITIAL  5 Patient will score <50% disability on QuickDASH to signify clinically meaningful improvement in functional abilities.  03/07/22 Initial     PLAN: PT FREQUENCY: 2x/week   PT DURATION: 6 weeks   PLANNED INTERVENTIONS: Therapeutic exercises, Therapeutic activity, Neuro Muscular re-education, Patient/Family education, Joint mobilization, Aquatic Therapy, Dry Needling, Cryotherapy, Moist heat, Taping, Vasopneumatic device, and Manual therapy   PLAN FOR NEXT SESSION: Rt shoulder ROM and strengthening       Gwendolyn Grant, PT, DPT, ATC 02/28/22 4:13 PM

## 2022-02-28 ENCOUNTER — Ambulatory Visit: Payer: Medicaid Other

## 2022-02-28 ENCOUNTER — Other Ambulatory Visit: Payer: Self-pay

## 2022-02-28 DIAGNOSIS — M6281 Muscle weakness (generalized): Secondary | ICD-10-CM | POA: Diagnosis not present

## 2022-02-28 DIAGNOSIS — M25511 Pain in right shoulder: Secondary | ICD-10-CM | POA: Diagnosis not present

## 2022-02-28 DIAGNOSIS — G8929 Other chronic pain: Secondary | ICD-10-CM | POA: Diagnosis not present

## 2022-03-01 ENCOUNTER — Other Ambulatory Visit: Payer: Self-pay | Admitting: *Deleted

## 2022-03-02 ENCOUNTER — Ambulatory Visit
Admission: RE | Admit: 2022-03-02 | Discharge: 2022-03-02 | Disposition: A | Discharge status: Home / Self Care | Payer: Medicaid Other | Source: Ambulatory Visit | Attending: Hematology and Oncology | Admitting: Hematology and Oncology

## 2022-03-02 ENCOUNTER — Encounter: Payer: Self-pay | Admitting: Hematology and Oncology

## 2022-03-02 ENCOUNTER — Ambulatory Visit: Payer: Medicaid Other

## 2022-03-02 ENCOUNTER — Telehealth: Payer: Self-pay | Admitting: *Deleted

## 2022-03-02 DIAGNOSIS — R922 Inconclusive mammogram: Secondary | ICD-10-CM | POA: Diagnosis not present

## 2022-03-02 DIAGNOSIS — Z170 Estrogen receptor positive status [ER+]: Secondary | ICD-10-CM

## 2022-03-02 IMAGING — MG DIGITAL DIAGNOSTIC BILAT W/ TOMO W/ CAD
6 of 11 series · 6 of 31 positions shown · non-contrast
Comparison: Previous exam(s).

CLINICAL DATA: Right lumpectomy.  Annual mammography.

EXAM:
DIGITAL DIAGNOSTIC BILATERAL MAMMOGRAM WITH TOMOSYNTHESIS AND CAD
TECHNIQUE: Bilateral digital diagnostic mammography and breast tomosynthesis
was performed. The images were evaluated with computer-aided
detection.

[R MLO]
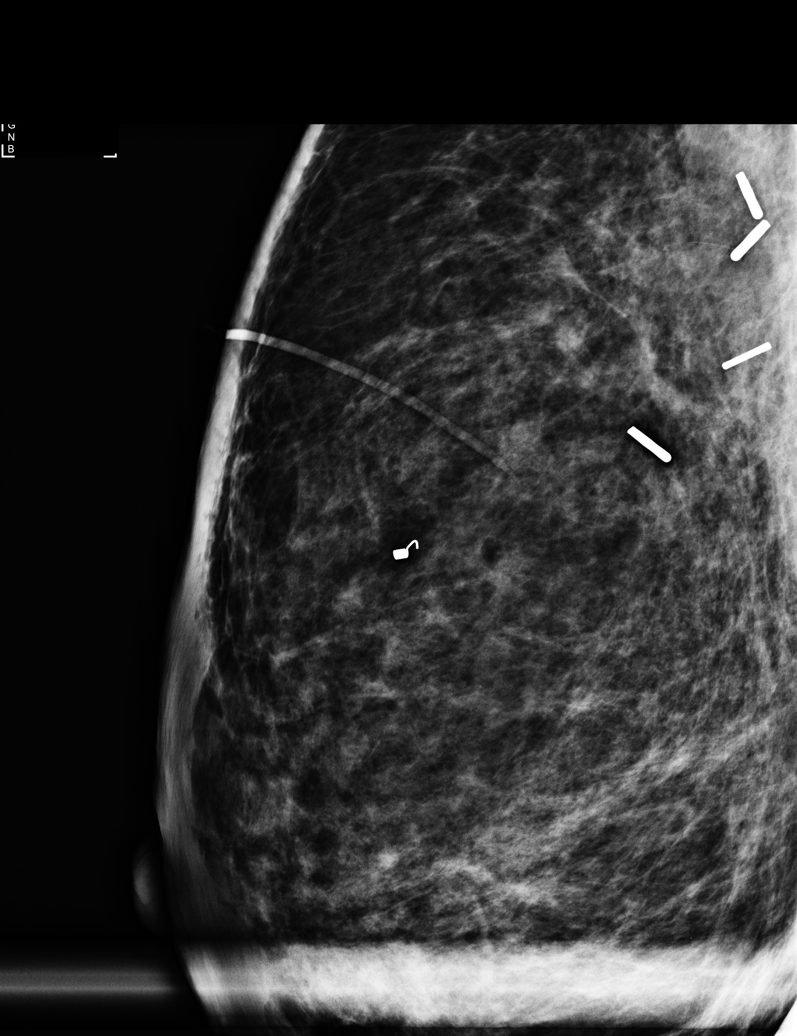

[L CC synth-2D]
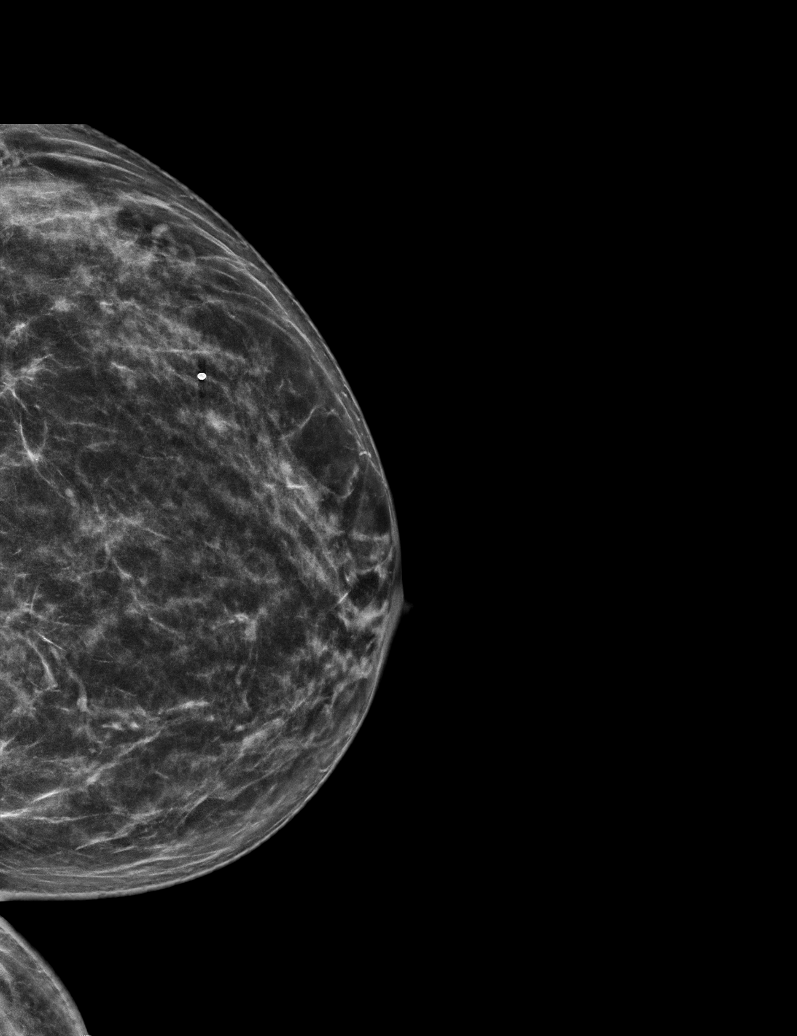

[R MLO synth-2D (1 of 2)]
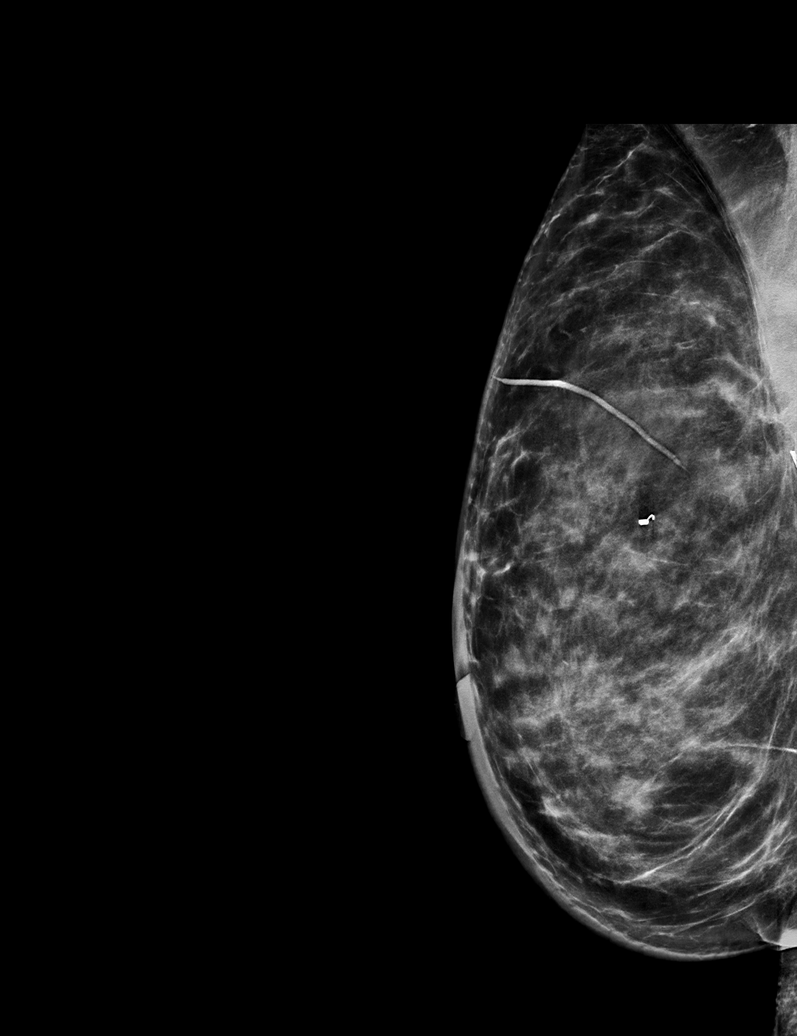

[L MLO synth-2D]
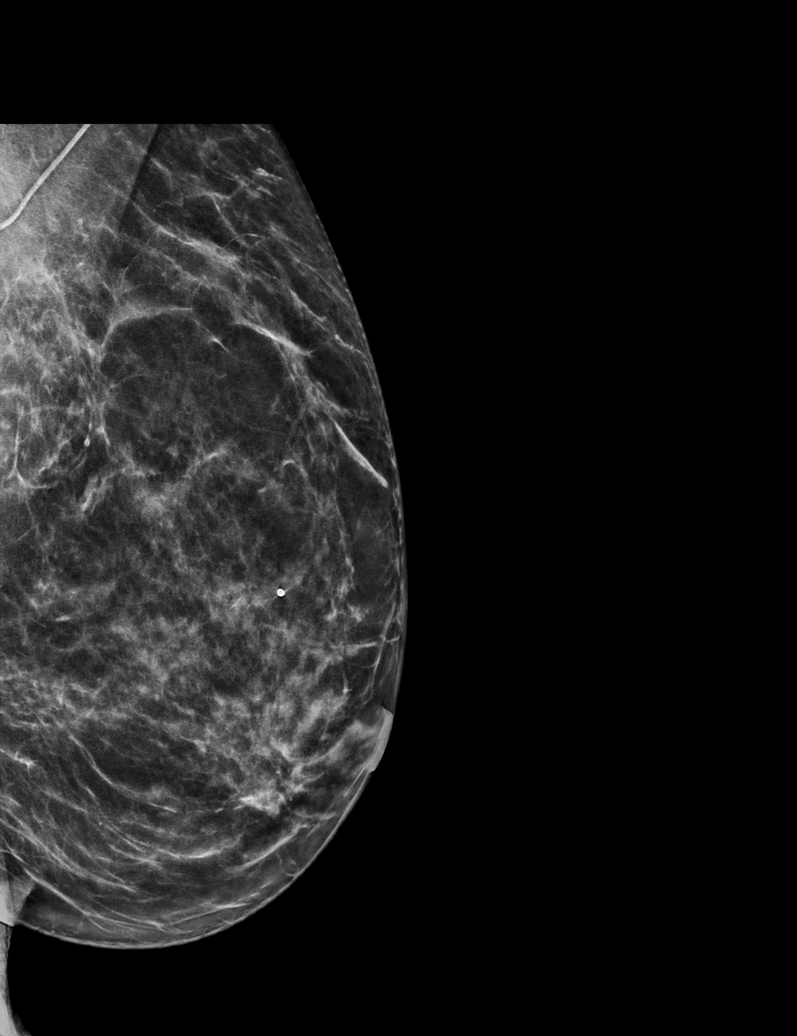

[R CC synth-2D]
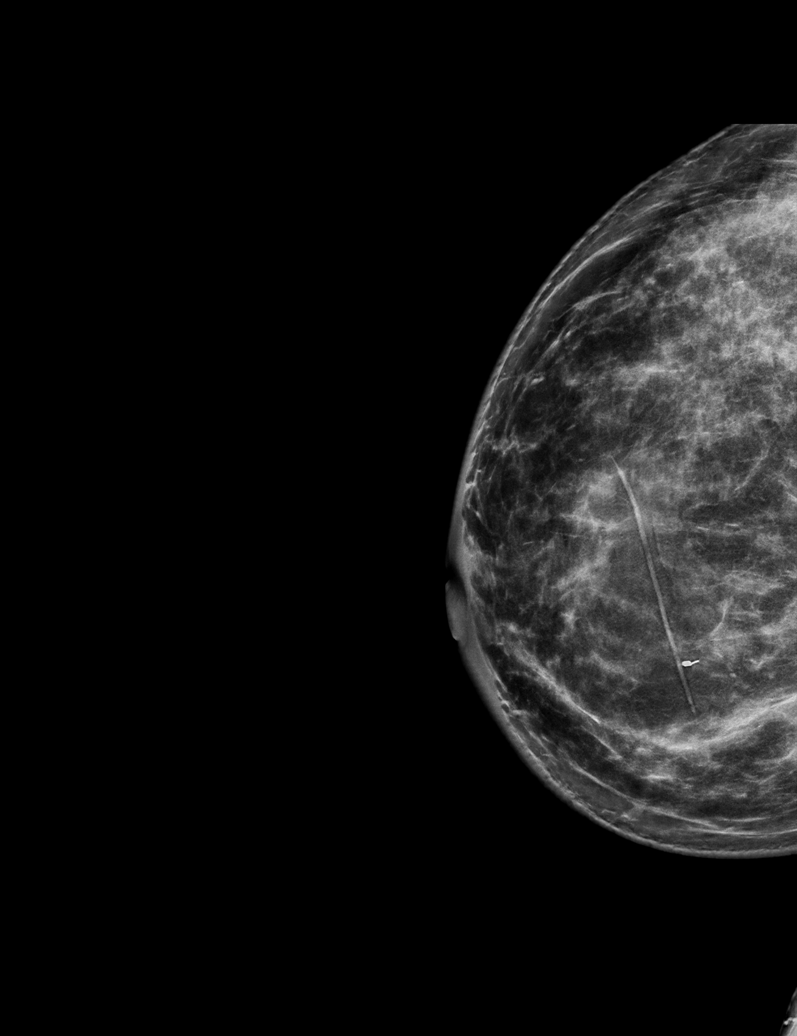

[R MLO synth-2D (2 of 2)]
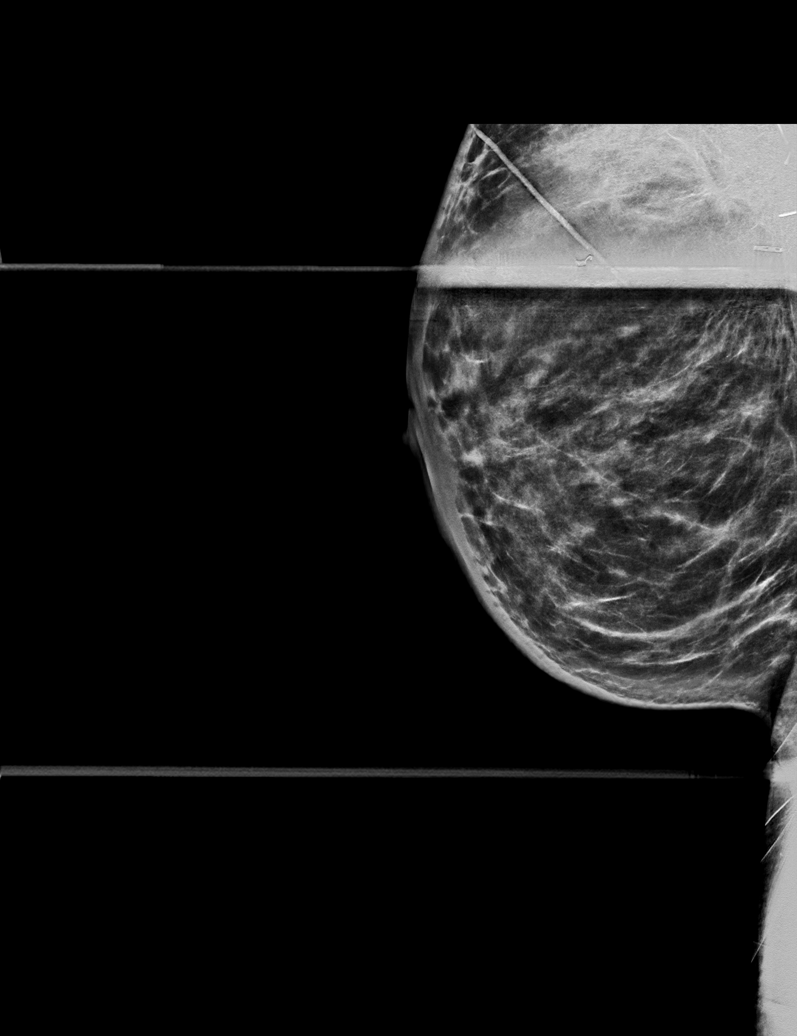

[6 of 31 positions shown; findings below may reference images not displayed]

ACR Breast Density Category c: The breast tissue is heterogeneously
dense, which may obscure small masses.
FINDINGS: The right lumpectomy site appears as expected. No suspicious masses,
calcifications, or distortion identified in either breast.
IMPRESSION: No mammographic evidence of malignancy.

RECOMMENDATION:
Annual diagnostic mammography.

I have discussed the findings and recommendations with the patient.
If applicable, a reminder letter will be sent to the patient
regarding the next appointment.

BI-RADS CATEGORY  2: Benign.

## 2022-03-02 NOTE — Telephone Encounter (Signed)
Unable to connect with Arlyn Dunning [phone] (home) regarding Social Security Administration, BCN#: [id number], Form [id number].   ?Message left requesting return call to provide information below.   ?Arlyn Dunning should also connect with ([place]) [facility].I[facility] phone: [phone] opt 2) to verify ROI authorization needs.  A Newly signed ROI may be required for records beyond date of signature and/or if she or her legal advisor are also to receive records.         ?Form to be completed by claimant.  [facility] Patient Portal use noted which is a beneficial resource if needed. ?Medical records needed for a requested Social Security hearing, given to [place].I.M. staff for ([place]) H.I.M. Team included copy of authorization to disclose information to the [place] ([place]), signed by patient on 03/05/2021.   ?

## 2022-03-02 NOTE — Telephone Encounter (Signed)
With return call received from Arlyn Dunning [phone]), this nurse provided previous call information. ? ?"Both providers said they would place something in my chart to help with disability.  Does not matter who receives records.  I or my attorney will get the information to [facility].  Time is limited.  I need to pick them up before surgical device implanted on 03/10/2022.  Hearing is scheduled May 01, 2022, may not be needed if [employer] receives records.  Whatever is fastest, do it." ? ?This forms nurse advised Arlyn Dunning connect with ([place]) H.I.M., phone: [phone] opt. 2)  ?Reason is [facility] does not manage release of records.  [employer] record request per EMR asks for records 07/09/2021 to present, signed 05/05/2021, received 10/24/2021 allows release of records from 07/09/2021 to 05/05/2021 only.   ?This nurse unaware and unable to confirm ([place] H.I.M. record release protocol for [employer] or provider information type or date of what Amanda Rich is expecting.   ? ?"I do not have a case manager but will call the ([facility]) H.I.M."   ?Denies further questions or needs.  ? ?This nurse informed [place].I.M. staff of patient call.  [facility] faxed request to ([facility]) H.I.M[employer] as an urgent request. ?

## 2022-03-03 ENCOUNTER — Inpatient Hospital Stay (HOSPITAL_COMMUNITY): Payer: Medicaid Other

## 2022-03-05 ENCOUNTER — Other Ambulatory Visit (HOSPITAL_COMMUNITY): Payer: Self-pay

## 2022-03-06 ENCOUNTER — Other Ambulatory Visit (HOSPITAL_COMMUNITY): Payer: Self-pay

## 2022-03-06 NOTE — Pre-Procedure Instructions (Signed)
Surgical Instructions ? ? ? Your procedure is scheduled on Friday March 17. ? Report to [facility] Main Entrance "A" at 8:00 A.M., then check in with the Admitting office. ? Call this number if you have problems the morning of surgery: ? [phone] ? ? If you have any questions prior to your surgery date call [phone]: Open Monday-Friday 8am-4pm ? ? ? Remember: ? Do not eat after midnight the night before your surgery ? ?You may drink clear liquids until 7:00AM the morning of your surgery.   ?Clear liquids allowed are: Water, Non-Citrus Juices (without pulp), Carbonated Beverages, Clear Tea, Black Coffee ONLY (NO MILK, CREAM OR POWDERED CREAMER of any kind), and Gatorade ?  ? Take these medicines the morning of surgery with A SIP OF WATER:  ? ?gabapentin (NEURONTIN)  ?anastrozole (ARIMIDEX) ?budesonide-formoterol (SYMBICORT)  ?Tiotropium Bromide Monohydrate (SPIRIVA RESPIMAT) ?baclofen (LIORESAL) if needed ?albuterol (PROAIR HFA) if needed ? ?Please bring inhalers to hospital with you on the day of surgery.  ? ?Follow your surgeon's instructions regarding taking Aspirin, Brilinta (Ticgrelor), Prednisone (deltasone), and diphenhydramine (Benadryl) prior to surgery.  ? ?As of today stop taking Aleve, Naproxen, Ibuprofen, Motrin, Advil, Goody's, BC's, all herbal medications, fish oil, and all vitamins. ? ?         ?Do not wear jewelry or makeup ?Do not wear lotions, powders, perfumes/colognes, or deodorant. ?Do not shave 48 hours prior to surgery.  Men may shave face and neck. ?Do not bring valuables to the hospital. ?Do not wear nail polish, gel polish, artificial nails, or any other type of covering on natural nails (fingers and toes) ?If you have artificial nails or gel coating that need to be removed by a nail salon, please have this removed prior to surgery. Artificial nails or gel coating may interfere with anesthesia's ability to adequately monitor your vital signs. ? ?[facility] is not responsible for  any belongings or valuables. .  ? ?Do NOT Smoke (Tobacco/Vaping)  24 hours prior to your procedure ? ?If you use a CPAP at night, you may bring your mask for your overnight stay. ?  ?Contacts, glasses, hearing aids, dentures or partials may not be worn into surgery, please bring cases for these belongings ?  ?For patients admitted to the hospital, discharge time will be determined by your treatment team. ?  ?Patients discharged the day of surgery will not be allowed to drive home, and someone needs to stay with them for 24 hours. ? ?NO VISITORS WILL BE ALLOWED IN PRE-OP WHERE PATIENTS ARE PREPPED FOR SURGERY.  ONLY 1 SUPPORT PERSON MAY BE PRESENT IN THE WAITING ROOM WHILE YOU ARE IN SURGERY.  IF YOU ARE TO BE ADMITTED, ONCE YOU ARE IN YOUR ROOM YOU WILL BE ALLOWED TWO (2) VISITORS. 1 (ONE) VISITOR MAY STAY OVERNIGHT BUT MUST ARRIVE TO THE ROOM BY 8pm.  Minor children may have two parents present. Special consideration for safety and communication needs will be reviewed on a case by case basis. ? ?Special instructions:   ? ?Oral Hygiene is also important to reduce your risk of infection.  Remember - BRUSH YOUR TEETH THE MORNING OF SURGERY WITH YOUR REGULAR TOOTHPASTE ? ? ?Halfway- Preparing For Surgery ? ?Before surgery, you can play an important role. Because skin is not sterile, your skin needs to be as free of germs as possible. You can reduce the number of germs on your skin by washing with CHG (chlorahexidine gluconate) Soap before surgery.  CHG is an  antiseptic cleaner which kills germs and bonds with the skin to continue killing germs even after washing.   ? ? ?Please do not use if you have an allergy to CHG or antibacterial soaps. If your skin becomes reddened/irritated stop using the CHG.  ?Do not shave (including legs and underarms) for at least 48 hours prior to first CHG shower. It is OK to shave your face. ? ?Please follow these instructions carefully. ?  ? ? Shower the NIGHT BEFORE SURGERY and the  MORNING OF SURGERY with CHG Soap.  ? If you chose to wash your hair, wash your hair first as usual with your normal shampoo. After you shampoo, rinse your hair and body thoroughly to remove the shampoo.  Then [employer] and genitals (private parts) with your normal soap and rinse thoroughly to remove soap. ? ?After that Use CHG Soap as you would any other liquid soap. You can apply CHG directly to the skin and wash gently with a scrungie or a clean washcloth.  ? ?Apply the CHG Soap to your body ONLY FROM THE NECK DOWN.  Do not use on open wounds or open sores. Avoid contact with your eyes, ears, mouth and genitals (private parts). Wash Face and genitals (private parts)  with your normal soap.  ? ?Wash thoroughly, paying special attention to the area where your surgery will be performed. ? ?Thoroughly rinse your body with warm water from the neck down. ? ?DO NOT shower/wash with your normal soap after using and rinsing off the CHG Soap. ? ?Pat yourself dry with a CLEAN TOWEL. ? ?Wear CLEAN PAJAMAS to bed the night before surgery ? ?Place CLEAN SHEETS on your bed the night before your surgery ? ?DO NOT SLEEP WITH PETS. ? ? ?Day of Surgery: ? ?Take a shower with CHG soap. ?Wear Clean/Comfortable clothing the morning of surgery ?Do not apply any deodorants/lotions.   ?Remember to brush your teeth WITH YOUR REGULAR TOOTHPASTE. ? ? ? ?COVID testing ? ?If you are going to stay overnight or be admitted after your procedure/surgery and require a pre-op COVID test, please follow these instructions after your COVID test  ? ?You are not required to quarantine however you are required to wear a well-fitting mask when you are out and around people not in your household.  If your mask becomes wet or soiled, replace with a new one. ? ?Wash your hands often with soap and water for 20 seconds or clean your hands with an alcohol-based hand sanitizer that contains at least 60% alcohol. ? ?Do not share personal items. ? ?Notify your  provider: ?if you are in close contact with someone who has COVID  ?or if you develop a fever of 100.4 or greater, sneezing, cough, sore throat, shortness of breath or body aches. ? ?  ?Please read over the following fact sheets that you were given.  ? ?

## 2022-03-07 ENCOUNTER — Other Ambulatory Visit: Payer: Self-pay

## 2022-03-07 ENCOUNTER — Encounter (HOSPITAL_COMMUNITY)
Admission: RE | Admit: 2022-03-07 | Discharge: 2022-03-07 | Disposition: A | Discharge status: Home / Self Care | Payer: Medicaid Other | Source: Ambulatory Visit | Attending: Neurosurgery | Admitting: Neurosurgery

## 2022-03-07 ENCOUNTER — Ambulatory Visit: Payer: Medicaid Other

## 2022-03-07 ENCOUNTER — Encounter (HOSPITAL_COMMUNITY): Payer: Self-pay

## 2022-03-07 VITALS — BP 114/81 | HR 103 | Temp 98.1°F | Resp 17 | Ht 65.0 in | Wt 124.6 lb

## 2022-03-07 DIAGNOSIS — Z20822 Contact with and (suspected) exposure to covid-19: Secondary | ICD-10-CM | POA: Diagnosis not present

## 2022-03-07 DIAGNOSIS — M6281 Muscle weakness (generalized): Secondary | ICD-10-CM

## 2022-03-07 DIAGNOSIS — Z01818 Encounter for other preprocedural examination: Secondary | ICD-10-CM | POA: Diagnosis not present

## 2022-03-07 DIAGNOSIS — G8929 Other chronic pain: Secondary | ICD-10-CM

## 2022-03-07 HISTORY — DX: Unspecified asthma, uncomplicated: J45.909

## 2022-03-07 HISTORY — DX: Dyspnea, unspecified: R06.00

## 2022-03-07 LAB — BASIC METABOLIC PANEL
Anion gap: 7 (ref 5–15)
BUN: 6 mg/dL (ref 6–20)
CO2: 26 mmol/L (ref 22–32)
Calcium: 9.5 mg/dL (ref 8.9–10.3)
Chloride: 106 mmol/L (ref 98–111)
Creatinine, Ser: 0.62 mg/dL (ref 0.44–1.00)
GFR, Estimated: >60 mL/min (ref >60)
Glucose, Bld: 87 mg/dL (ref 70–99)
Potassium: 3.8 mmol/L (ref 3.5–5.1)
Sodium: 139 mmol/L (ref 135–145)

## 2022-03-07 LAB — CBC
HCT: 37.8 % (ref 36.0–46.0)
Hemoglobin: 12.1 g/dL (ref 12.0–15.0)
MCH: 28.2 pg (ref 26.0–34.0)
MCHC: 32 g/dL (ref 30.0–36.0)
MCV: 88.1 fL (ref 80.0–100.0)
Platelets: 216 10*3/uL (ref 150–400)
RBC: 4.29 MIL/uL (ref 3.87–5.11)
RDW: 13.2 % (ref 11.5–15.5)
WBC: 3.8 10*3/uL — ABNORMAL LOW (ref 4.0–10.5)
nRBC: 0 % (ref 0.0–0.2)

## 2022-03-07 LAB — SARS CORONAVIRUS 2 BY RT PCR (HOSPITAL ORDER, PERFORMED IN ~~LOC~~ HOSPITAL LAB): SARS Coronavirus 2: NEGATIVE

## 2022-03-07 NOTE — Therapy (Addendum)
?OUTPATIENT PHYSICAL THERAPY TREATMENT NOTE ?PHYSICAL THERAPY DISCHARGE SUMMARY ? ?Visits from Start of Care: 7 ? ?Current functional level related to goals / functional outcomes: ?See goals below ?  ?Remaining deficits: ?Status unknown ?  ?Education / Equipment: ?N/A   ? ?Patient agrees to discharge. Patient goals were partially met. Patient is being discharged due to a change in medical status as she is scheduled to undergo unrelated surgery on 03/10/22 that will prevent her from continuing PT for her shoulder at this time.  ? ? ? ?Patient Name: JENNINGS STIRLING ?MRN: [id number] ?DOB:08/17/1966, 56 y.o., female ?Today's Date: 03/07/2022 ? ?PCP: Irene Pap, PA-C ?REFERRING PROVIDER: Girtha Rm, NP-C ? ? PT End of Session - 03/07/22 1532   ? ? Visit Number 7   ? Number of Visits 13   ? Date for PT Re-Evaluation 03/11/22   ? Authorization Type [facility]   ? Authorization Time Period 2/3-04/24/22   ? Authorization - Visit Number 6   ? Authorization - Number of Visits 12   ? PT Start Time 1532   ? PT Stop Time 1612   ? PT Time Calculation (min) 40 min   ? Activity Tolerance Patient tolerated treatment well   ? Behavior During Therapy [facility] for tasks assessed/performed   ? ?  ?  ? ?  ? ? ? ? ? ? ? ?Past Medical History:  ?Diagnosis Date  ? Anemia   ? Asthma   ? Cancer [facility])   ? breast cancer  ? Depression   ? Dyspnea   ? Family history of breast cancer   ? Family history of liver cancer   ? History of radiation therapy 03/10/21-04/11/21  ? Right breast- Dr. Gery Pray  ? Pericarditis   ? Smoker   ? Umbilical hernia   ? ?Past Surgical History:  ?Procedure Laterality Date  ? ABLATION ON ENDOMETRIOSIS    ? BREAST BIOPSY Right   ? BREAST LUMPECTOMY WITH RADIOACTIVE SEED AND SENTINEL LYMPH NODE BIOPSY Right 02/02/2021  ? Procedure: RIGHT BREAST LUMPECTOMY WITH RADIOACTIVE SEED AND RIGHT SENTINEL LYMPH NODE MAPPING;  Surgeon: [name], MD;  Location: [place];  Service: General;   Laterality: Right;  ? GANGLION CYST EXCISION  12/25/2005  ? L wrist; [facility], [place]  ? IR ANGIO INTRA EXTRACRAN SEL INTERNAL CAROTID BILAT MOD SED  02/06/2022  ? IR ANGIO VERTEBRAL SEL VERTEBRAL BILAT MOD SED  02/06/2022  ? IR IMAGING GUIDED PORT INSERTION  09/10/2020  ? TUBAL LIGATION    ? [facility]  ? UMBILICAL HERNIA REPAIR  12/26/2003  ? [place]  ? ?Patient Active Problem List  ? Diagnosis Date Noted  ? Pain in right shoulder 02/02/2022  ? Carotid aneurysm, right ([place]) 12/22/2021  ? Muscle spasm of right leg 12/08/2021  ? Chemotherapy-induced neuropathy ([place]) 11/14/2021  ? Family history of breast cancer   ? Family history of liver cancer   ? Malignant neoplasm of 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, estrogen receptor positive ([place]) 08/25/2020  ? Abnormal chest CT 01/30/2019  ? History of pericarditis 01/30/2019  ? Pericarditis 01/22/2019  ? Chest pain 12/26/2018  ? History of endometrial ablation 01/21/2018  ? Family history of colon cancer 09/24/2017  ? Poor diet 01/24/2017  ? Weight loss, abnormal 01/24/2017  ? Smoker 09/22/2016  ? Mixed hyperlipidemia 08/07/2016  ? Irregular intermenstrual bleeding 08/07/2016  ? Depression 08/07/2016  ? Fat necrosis of peritoneum ([place]) 08/07/2016  ? Pelvic congestion syndrome 08/07/2016  ? ? ?  REFERRING DIAG: M25.511,G89.29 (ICD-10-CM) - Chronic right shoulder pain  ? ?THERAPY DIAG:  ?Chronic right shoulder pain ? ?Muscle weakness (generalized) ? ?PERTINENT HISTORY: S/P axillary lymph node dissection for breast carcinoma 10 months ago; currently in remission from Rt breast cancer.  ?Recent subacromial injection  ? ?PRECAUTIONS: None ? ?SUBJECTIVE:  ?"I'm feeling ok. I think I'm on too much medicine. They put me on a blood thinner for this surgery and it made me dizzy. I don't know what it is. My shoulder isn't bothering me, but my breast is really sore from my mammogram."  ? ?PAIN:  ?Are you having pain? No ?Worst NPRS scale: 4/10 ?Pain location: anterolateral Rt shoulder   ?PAIN TYPE:sharp, pinch  ?Pain description: intermittent  ?Aggravating factors: laying on Rt side   ?Relieving factors: unknown  ? ? ?OBJECTIVE:  ? *Unless otherwise noted all objective measures were captured on initial evaluation.  ? ?DIAGNOSTIC FINDINGS:  ?MRI IMPRESSION: ?1. Severe tendinosis of the supraspinatus tendon with a high-grade ?partial-thickness articular surface tear and subcortical reactive ?marrow changes. ?2. Moderate tendinosis of the infraspinatus tendon. ?3. Mild tendinosis of the subscapularis tendon. ?4. Moderate tendinosis of the intra-articular portion of the long ?head of the biceps tendon. ?5. Mild subacromial/subdeltoid bursitis. ?  ?PATIENT SURVEYS:  ?Quick Dash 77% disability ?                              ?SENSATION: ?         She reports lack of sensation along axilla secondary to lymph node removal.  ?          ?POSTURE: ?Forward head/rounded shoulders  ?  ?PALPATION: ?Diffuse tenderness about anterolateral shoulder  ?  ? ?  ?UPPER EXTREMITY AROM/PROM: left shoulder AROM WNL ?  ?A/PROM Right ?01/25/2022 Left ?01/25/2022 02/13/22 ?Right  02/21/22 02/28/22  ?Shoulder flexion 110 AROM ?140 PROM empty end feel    150 AROM 151 AROM Rt   ?Shoulder extension         ?Shoulder abduction 95 AROM ?100 PROM empty end feel     120 AROM Rt   ?Shoulder adduction         ?Shoulder internal rotation 50 AROM ?50 PROM empty end feel     57 AROM  ?Shoulder external rotation 55 AROM ?90 PROM ?Firm end feel      90 AROM  ?Elbow flexion         ?Elbow extension         ?Wrist flexion         ?Wrist extension         ?Wrist ulnar deviation         ?Wrist radial deviation         ?Wrist pronation         ?Wrist supination         ?(Blank rows = not tested) ?  ?UPPER EXTREMITY MMT: ?Pain with all Rt shoulder MMT ?  ?MMT Right ?01/25/2022 Left ?01/25/2022  ?Shoulder flexion 3+/5 (in available range) 4+/5  ?Shoulder extension      ?Shoulder abduction 4-/5 (in available range) 5/5  ?Shoulder adduction      ?Shoulder  internal rotation 4-/5 5/5  ?Shoulder external rotation 4/5 5/5  ?Middle trapezius      ?Lower trapezius      ?Elbow flexion 4-/5 5/5  ?Elbow extension 4/5 5/5  ?Wrist flexion      ?Wrist extension      ?  Wrist ulnar deviation      ?Wrist radial deviation      ?Wrist pronation      ?Wrist supination      ?Grip strength (lbs)      ?(Blank rows = not tested) ?  ?SHOULDER SPECIAL TESTS: ?          (+) Neers (+) Michel Bickers (+) Empty Can (-) Yergason's  ?  ?           ?TODAY'S TREATMENT:  ? ?[facility] Adult PT Treatment:                                                DATE: 03/07/22 ?Therapeutic Exercise: ?UBE level 2 x 4 minutes  ?Resisted shoulder adduction 2 x 10 yellow band  ?Resisted shoulder extension 2 x 10 yellow band  ?Prone row 2 x 10; 3#  ?Prone single arm T 2 x 10  ?Serratus punch 2 x 15 2 lbs  ? ? ?[facility] Adult PT Treatment:                                                DATE: 02/28/22 ?Therapeutic Exercise: ?UBE level 2 x 4 minutes  ?Resisted shoulder extension yellow band 3 x 10  ?Resisted rows 3 x 10 red band  ?Serratus wall slide 1 x 10  ?Manual Therapy: ?Rt shoulder PROM in all planes to tolerance ?Rt GHJ mobilizations grade II-III inferior, anterior, and posterior  ? ? ? ?[facility] Adult PT Treatment:                                                DATE: 02/23/22 ?Therapeutic Exercise: ?UBE level 2 x 4 minutes  ?Sleeper stretch RUE 3 x 30 sec ?Sidelying shoulder flexion RUE 2 x 10  ?Sidelying shoulder ER RUE 2 x 10; 1 lb  ?Supine shoulder flexion with stability ball 2 x 10  ?Serratus punch with stability ball 2 x 15 ?Manual Therapy: ?Rt shoulder PROM in all planes to tolerance ?Rt GHJ mobilizations grade II-III inferior, anterior, and posterior  ? ?  ?PATIENT EDUCATION: ?Education details: recommended to discuss medication with PCP  ?Person educated: patient  ?Education method: instruction  ?Education comprehension: verbalized understanding  ?  ?  ?HOME EXERCISE PROGRAM: ?Access Code: [email or web address] ?  ?ASSESSMENT: ?   ?CLINICAL IMPRESSION: ?Continued to progress shoulder and periscapular strengthening with patient having difficulty controlling for excessive upper trap engagement with majority of strengthening exercises today. Visible sha

## 2022-03-07 NOTE — Progress Notes (Addendum)
PCP: Alda Lea, PA ?Cardiologist: Patient states echo's done at hospital.  Unaware of a cardiologist.   ? ?EKG: 03/07/22 ?CXR: na ?ECHO:  07/08/21 ?Stress Test:  denies ?Cardiac Cath:  denies ? ?Fasting Blood Sugar-  na ?Checks Blood Sugar__na_ times a day ? ?ASA/Brilinta: Patient started 02/24/22 and was told to continue through DOS ? ?OSA/CPAP: No ? ?Covid test 03/07/22 at PAT ? ?Anesthesia Review: Yes, pericaditis 12/2018.  Spoke to [place], [place] and he said to forward chart for anesthesia review. ? ?Patient denies shortness of breath, fever, cough, and chest pain at PAT appointment. ? ?Patient verbalized understanding of instructions provided today at the PAT appointment.  Patient asked to review instructions at home and day of surgery.   ?

## 2022-03-08 NOTE — Progress Notes (Signed)
Anesthesia Chart Review: ? ? Case: [id number] Date/Time: 03/10/22 0945  ? Procedure: Pipeline Embolization of Right Internal Carotid Artery Aneurysm  ? Anesthesia type: General  ? Pre-op diagnosis: I67.1 Cerebral aneurysm  ? [facility] ROOM [facility] / [facility]  ? Surgeons: Consuella Lose, MD  ? ?  ? ? ?DISCUSSION: ?Pt is 56 years old with hx pericarditis (2020, though consequence of viral respiratory infection), cerebral aneurysm, breast cancer, asthma. Current smoker ? ?VS: BP 114/81   Pulse (!) 103   Temp 36.7 ?C (Oral)   Resp 17   Ht '5\' 5"'$  (1.651 m)   Wt 56.5 kg   LMP 10/15/2017 (Approximate)   SpO2 100%   BMI 20.73 kg/m?  ? ?PROVIDERS: ?- PCP is Irene Pap, PA-C ? ? ?LABS: Labs reviewed: Acceptable for surgery. ?(all labs ordered are listed, but only abnormal results are displayed) ? ?Labs Reviewed  ?CBC - Abnormal; Notable for the following components:  ?    Result Value  ? WBC 3.8 (*)   ? All other components within normal limits  ?SARS CORONAVIRUS 2 BY RT PCR (HOSPITAL ORDER, [place] [facility])  ?BASIC METABOLIC PANEL  ? ? ? ?IMAGES: ?CT angio head 01/03/22:  ?- Confirmed 5.5 x 4 mm right superior hypophyseal region aneurysm which is somewhat lobulated, recommend endovascular referral. ? ? ?EKG 03/07/22: NSR ? ? ?CV: ?Echo 07/08/21:  ?1. Left ventricular ejection fraction, by estimation, is 60 to 65%. The left ventricle has normal function. The left ventricle has no regional wall motion abnormalities. Left ventricular diastolic parameters were normal. The average left ventricular global longitudinal strain is -18.7 %. The global longitudinal strain is normal.  ?2. Right ventricular systolic function is normal. The right ventricular size is normal. There is normal pulmonary artery systolic pressure.  ?3. The mitral valve is normal in structure. No evidence of mitral valve regurgitation. No evidence of mitral stenosis.  ?4. The aortic valve is tricuspid. Aortic valve regurgitation is  not visualized. No aortic stenosis is present.  ?- Comparison(s): A prior study was performed on 01/28/21. Prior images reviewed side by side. Similar strain at similar loading conditions.  ? ? ?Past Medical History:  ?Diagnosis Date  ? Anemia   ? Asthma   ? Cancer [facility])   ? breast cancer  ? Depression   ? Dyspnea   ? Family history of breast cancer   ? Family history of liver cancer   ? History of radiation therapy 03/10/21-04/11/21  ? Right breast- Dr. Gery Pray  ? Pericarditis   ? Smoker   ? Umbilical hernia   ? ? ?Past Surgical History:  ?Procedure Laterality Date  ? ABLATION ON ENDOMETRIOSIS    ? BREAST BIOPSY Right   ? BREAST LUMPECTOMY WITH RADIOACTIVE SEED AND SENTINEL LYMPH NODE BIOPSY Right 02/02/2021  ? Procedure: RIGHT BREAST LUMPECTOMY WITH RADIOACTIVE SEED AND RIGHT SENTINEL LYMPH NODE MAPPING;  Surgeon: [name], MD;  Location: [place];  Service: General;  Laterality: Right;  ? GANGLION CYST EXCISION  12/25/2005  ? L wrist; [facility], [place]  ? IR ANGIO INTRA EXTRACRAN SEL INTERNAL CAROTID BILAT MOD SED  02/06/2022  ? IR ANGIO VERTEBRAL SEL VERTEBRAL BILAT MOD SED  02/06/2022  ? IR IMAGING GUIDED PORT INSERTION  09/10/2020  ? TUBAL LIGATION    ? [facility]  ? UMBILICAL HERNIA REPAIR  12/26/2003  ? [facility]  ? ? ?MEDICATIONS: ? albuterol (PROAIR HFA) 108 (90 Base) MCG/ACT inhaler  ?  anastrozole (ARIMIDEX) 1 MG tablet  ? aspirin 81 MG chewable tablet  ? baclofen (LIORESAL) 10 MG tablet  ? budesonide-formoterol (SYMBICORT) 80-4.5 MCG/ACT inhaler  ? Calcium-Vitamin D-Vitamin K (VIACTIV PO)  ? Cholecalciferol (VITAMIN D) 50 MCG (2000 UT) tablet  ? cholecalciferol (VITAMIN D3) 25 MCG (1000 UNIT) tablet  ? citalopram (CELEXA) 10 MG tablet  ? diphenhydrAMINE (BENADRYL) 25 mg capsule  ? gabapentin (NEURONTIN) 300 MG capsule  ? nicotine (NICODERM CQ - DOSED IN MG/24 HR) 7 mg/24hr patch  ? predniSONE (DELTASONE) 50 MG tablet  ? pregabalin (LYRICA) 75 MG capsule  ? ticagrelor (BRILINTA) 90 MG TABS tablet   ? Tiotropium Bromide Monohydrate (SPIRIVA RESPIMAT) 2.5 MCG/ACT AERS  ? ?No current facility-administered medications for this encounter.  ? ? ?If no changes, I anticipate pt can proceed with surgery as scheduled.  ? ?Willeen Cass, PhD, FNP-BC ?[facility]/Anesthesiology ?Phone: [phone] ?03/08/2022 11:24 AM ? ? ? ? ? ? ?

## 2022-03-08 NOTE — Anesthesia Preprocedure Evaluation (Addendum)
Anesthesia Evaluation  ?Patient identified by MRN, date of birth, ID band ?Patient awake ? ? ? ?Reviewed: ?Allergy & Precautions, NPO status , Patient's Chart, lab work & pertinent test results ? ?Airway ?Mallampati: II ? ?TM Distance: >3 FB ?Neck ROM: Full ? ? ? Dental ?no notable dental hx. ? ?  ?Pulmonary ?asthma , Current Smoker and Patient abstained from smoking.,  ?  ?Pulmonary exam normal ?breath sounds clear to auscultation ? ? ? ? ? ? Cardiovascular ?negative cardio ROS ?Normal cardiovascular exam ?Rhythm:Regular Rate:Normal ? ? ?  ?Neuro/Psych ?Depression negative neurological ROS ? negative psych ROS  ? GI/Hepatic ?negative GI ROS, Neg liver ROS,   ?Endo/Other  ?negative endocrine ROS ? Renal/GU ?negative Renal ROS  ?negative genitourinary ?  ?Musculoskeletal ?negative musculoskeletal ROS ?(+)  ? Abdominal ?  ?Peds ?negative pediatric ROS ?(+)  Hematology ?negative hematology ROS ?(+)   ?Anesthesia Other Findings ? ? Reproductive/Obstetrics ?negative OB ROS ? ?  ? ? ? ? ? ? ? ? ? ? ? ? ? ?  ?  ? ? ? ? ? ? ? ?Anesthesia Physical ?Anesthesia Plan ? ?ASA: 3 ? ?Anesthesia Plan: General  ? ?Post-op Pain Management: Dilaudid IV  ? ?Induction: Intravenous ? ?PONV Risk Score and Plan: 2 and Ondansetron, Midazolam and Treatment may vary due to age or medical condition ? ?Airway Management Planned: Oral ETT ? ?Additional Equipment: Arterial line ? ?Intra-op Plan:  ? ?Post-operative Plan: Extubation in OR ? ?Informed Consent: I have reviewed the patients History and Physical, chart, labs and discussed the procedure including the risks, benefits and alternatives for the proposed anesthesia with the patient or authorized representative who has indicated his/her understanding and acceptance.  ? ? ? ?Dental advisory given ? ?Plan Discussed with: CRNA ? ?Anesthesia Plan Comments: (See APP note by Durel Salts, FNP )  ? ? ? ? ? ?Anesthesia Quick Evaluation ? ?

## 2022-03-09 ENCOUNTER — Ambulatory Visit: Payer: Medicaid Other

## 2022-03-09 ENCOUNTER — Telehealth: Payer: Self-pay

## 2022-03-09 NOTE — Telephone Encounter (Signed)
Spoke with patient regarding missed PT appointment. She apologized for forgetting appointment as she has been running errands in preparation for her upcoming surgery tomorrow. Patient in agreement with discharging from therapy for her shoulder given her unrelated surgery that is scheduled for tomorrow. Discussed obtaining a new referral for her shoulder once she has recovered from surgery with patient verbalizing understanding. ? ?Gwendolyn Grant, PT, DPT, ATC ?03/09/22 3:57 PM ?  ?

## 2022-03-10 ENCOUNTER — Inpatient Hospital Stay (HOSPITAL_COMMUNITY)
Admission: RE | Admit: 2022-03-10 | Discharge: 2022-03-14 | DRG: 027 | Disposition: A | Discharge status: Rehabilitation Facility | Payer: Medicaid Other | Source: Ambulatory Visit | Attending: Neurosurgery | Admitting: Neurosurgery

## 2022-03-10 ENCOUNTER — Inpatient Hospital Stay (HOSPITAL_COMMUNITY)
Admission: RE | Admit: 2022-03-10 | Discharge: 2022-03-10 | Disposition: A | Discharge status: Home / Self Care | Payer: Medicaid Other | Source: Ambulatory Visit | Attending: Neurosurgery | Admitting: Neurosurgery

## 2022-03-10 ENCOUNTER — Inpatient Hospital Stay (HOSPITAL_COMMUNITY): Payer: Medicaid Other | Admitting: Physician Assistant

## 2022-03-10 ENCOUNTER — Encounter (HOSPITAL_COMMUNITY): Payer: Self-pay | Admitting: Neurosurgery

## 2022-03-10 ENCOUNTER — Encounter (HOSPITAL_COMMUNITY)
Admission: RE | Disposition: A | Discharge status: Rehabilitation Facility | Payer: Self-pay | Source: Ambulatory Visit | Attending: Neurosurgery

## 2022-03-10 ENCOUNTER — Inpatient Hospital Stay (HOSPITAL_COMMUNITY): Payer: Medicaid Other | Admitting: Certified Registered Nurse Anesthetist

## 2022-03-10 ENCOUNTER — Other Ambulatory Visit: Payer: Self-pay

## 2022-03-10 DIAGNOSIS — J45909 Unspecified asthma, uncomplicated: Secondary | ICD-10-CM | POA: Diagnosis not present

## 2022-03-10 DIAGNOSIS — R350 Frequency of micturition: Secondary | ICD-10-CM | POA: Diagnosis not present

## 2022-03-10 DIAGNOSIS — J302 Other seasonal allergic rhinitis: Secondary | ICD-10-CM | POA: Diagnosis not present

## 2022-03-10 DIAGNOSIS — E8809 Other disorders of plasma-protein metabolism, not elsewhere classified: Secondary | ICD-10-CM | POA: Diagnosis not present

## 2022-03-10 DIAGNOSIS — Z881 Allergy status to other antibiotic agents status: Secondary | ICD-10-CM

## 2022-03-10 DIAGNOSIS — Z923 Personal history of irradiation: Secondary | ICD-10-CM | POA: Diagnosis not present

## 2022-03-10 DIAGNOSIS — Z79811 Long term (current) use of aromatase inhibitors: Secondary | ICD-10-CM

## 2022-03-10 DIAGNOSIS — Z801 Family history of malignant neoplasm of trachea, bronchus and lung: Secondary | ICD-10-CM | POA: Diagnosis not present

## 2022-03-10 DIAGNOSIS — Z803 Family history of malignant neoplasm of breast: Secondary | ICD-10-CM

## 2022-03-10 DIAGNOSIS — Z888 Allergy status to other drugs, medicaments and biological substances status: Secondary | ICD-10-CM | POA: Diagnosis not present

## 2022-03-10 DIAGNOSIS — Z7951 Long term (current) use of inhaled steroids: Secondary | ICD-10-CM | POA: Diagnosis not present

## 2022-03-10 DIAGNOSIS — I671 Cerebral aneurysm, nonruptured: Secondary | ICD-10-CM | POA: Diagnosis not present

## 2022-03-10 DIAGNOSIS — Z9221 Personal history of antineoplastic chemotherapy: Secondary | ICD-10-CM | POA: Diagnosis not present

## 2022-03-10 DIAGNOSIS — G622 Polyneuropathy due to other toxic agents: Secondary | ICD-10-CM | POA: Diagnosis present

## 2022-03-10 DIAGNOSIS — Z833 Family history of diabetes mellitus: Secondary | ICD-10-CM | POA: Diagnosis not present

## 2022-03-10 DIAGNOSIS — Z8249 Family history of ischemic heart disease and other diseases of the circulatory system: Secondary | ICD-10-CM

## 2022-03-10 DIAGNOSIS — R29818 Other symptoms and signs involving the nervous system: Secondary | ICD-10-CM | POA: Diagnosis not present

## 2022-03-10 DIAGNOSIS — M792 Neuralgia and neuritis, unspecified: Secondary | ICD-10-CM | POA: Diagnosis not present

## 2022-03-10 DIAGNOSIS — G629 Polyneuropathy, unspecified: Secondary | ICD-10-CM | POA: Diagnosis not present

## 2022-03-10 DIAGNOSIS — I69398 Other sequelae of cerebral infarction: Secondary | ICD-10-CM | POA: Diagnosis not present

## 2022-03-10 DIAGNOSIS — F32A Depression, unspecified: Secondary | ICD-10-CM | POA: Diagnosis not present

## 2022-03-10 DIAGNOSIS — F17210 Nicotine dependence, cigarettes, uncomplicated: Secondary | ICD-10-CM | POA: Diagnosis not present

## 2022-03-10 DIAGNOSIS — J439 Emphysema, unspecified: Secondary | ICD-10-CM | POA: Diagnosis present

## 2022-03-10 DIAGNOSIS — T451X5A Adverse effect of antineoplastic and immunosuppressive drugs, initial encounter: Secondary | ICD-10-CM | POA: Diagnosis present

## 2022-03-10 DIAGNOSIS — Z800 Family history of malignant neoplasm of digestive organs: Secondary | ICD-10-CM

## 2022-03-10 DIAGNOSIS — Z79899 Other long term (current) drug therapy: Secondary | ICD-10-CM

## 2022-03-10 DIAGNOSIS — R5381 Other malaise: Secondary | ICD-10-CM | POA: Diagnosis not present

## 2022-03-10 DIAGNOSIS — Z7902 Long term (current) use of antithrombotics/antiplatelets: Secondary | ICD-10-CM

## 2022-03-10 DIAGNOSIS — Z91041 Radiographic dye allergy status: Secondary | ICD-10-CM

## 2022-03-10 DIAGNOSIS — Z853 Personal history of malignant neoplasm of breast: Secondary | ICD-10-CM

## 2022-03-10 DIAGNOSIS — Z8679 Personal history of other diseases of the circulatory system: Principal | ICD-10-CM

## 2022-03-10 DIAGNOSIS — F329 Major depressive disorder, single episode, unspecified: Secondary | ICD-10-CM | POA: Diagnosis not present

## 2022-03-10 DIAGNOSIS — Z7982 Long term (current) use of aspirin: Secondary | ICD-10-CM | POA: Diagnosis not present

## 2022-03-10 DIAGNOSIS — I639 Cerebral infarction, unspecified: Secondary | ICD-10-CM | POA: Diagnosis not present

## 2022-03-10 HISTORY — PX: RADIOLOGY WITH ANESTHESIA: SHX6223

## 2022-03-10 HISTORY — PX: IR TRANSCATH/EMBOLIZ: IMG695

## 2022-03-10 HISTORY — PX: IR ANGIO INTRA EXTRACRAN SEL INTERNAL CAROTID UNI R MOD SED: IMG5362

## 2022-03-10 LAB — TYPE AND SCREEN
ABO/RH(D): A POS
Antibody Screen: NEGATIVE

## 2022-03-10 LAB — MRSA NEXT GEN BY PCR, NASAL: MRSA by PCR Next Gen: NOT DETECTED

## 2022-03-10 LAB — ABO/RH: ABO/RH(D): A POS

## 2022-03-10 SURGERY — IR WITH ANESTHESIA
Anesthesia: General

## 2022-03-10 MED ORDER — ESMOLOL HCL 100 MG/10ML IV SOLN
INTRAVENOUS | Status: DC | PRN
  Administered 2022-03-10: 30 mg via INTRAVENOUS

## 2022-03-10 MED ORDER — ASPIRIN 81 MG PO CHEW
81.0000 mg | CHEWABLE_TABLET | Freq: Every day | ORAL | Status: DC
  Administered 2022-03-11 – 2022-03-14 (×4): 81 mg via ORAL

## 2022-03-10 MED ORDER — ROCURONIUM BROMIDE 10 MG/ML (PF) SYRINGE
PREFILLED_SYRINGE | INTRAVENOUS | Status: DC | PRN
  Administered 2022-03-10: 60 mg via INTRAVENOUS
  Administered 2022-03-10: 20 mg via INTRAVENOUS

## 2022-03-10 MED ORDER — ANASTROZOLE 1 MG PO TABS
1.0000 mg | ORAL_TABLET | Freq: Every day | ORAL | Status: DC
  Administered 2022-03-10 – 2022-03-14 (×5): 1 mg via ORAL
  Filled 2022-03-10 (×5): qty 1

## 2022-03-10 MED ORDER — PREGABALIN 75 MG PO CAPS: 75.0000 mg | ORAL_CAPSULE | Freq: Two times a day (BID) | ORAL | Status: DC

## 2022-03-10 MED ORDER — PREDNISONE 20 MG PO TABS
40.0000 mg | ORAL_TABLET | Freq: Every day | ORAL | Status: DC
  Administered 2022-03-11 – 2022-03-14 (×4): 40 mg via ORAL

## 2022-03-10 MED ORDER — PANTOPRAZOLE SODIUM 40 MG IV SOLR
40.0000 mg | Freq: Every day | INTRAVENOUS | Status: DC
  Administered 2022-03-11: 40 mg via INTRAVENOUS

## 2022-03-10 MED ORDER — PROMETHAZINE HCL 25 MG/ML IJ SOLN: 6.2500 mg | INTRAMUSCULAR | Status: DC | PRN

## 2022-03-10 MED ORDER — GABAPENTIN 300 MG PO CAPS
300.0000 mg | ORAL_CAPSULE | Freq: Two times a day (BID) | ORAL | Status: DC
  Administered 2022-03-11 – 2022-03-14 (×8): 300 mg via ORAL

## 2022-03-10 MED ORDER — ONDANSETRON HCL 4 MG/2ML IJ SOLN: 4.0000 mg | INTRAMUSCULAR | Status: DC | PRN

## 2022-03-10 MED ORDER — ALBUTEROL SULFATE (2.5 MG/3ML) 0.083% IN NEBU: 3.0000 mL | INHALATION_SOLUTION | Freq: Four times a day (QID) | RESPIRATORY_TRACT | Status: DC | PRN

## 2022-03-10 MED ORDER — UMECLIDINIUM BROMIDE 62.5 MCG/ACT IN AEPB
1.0000 | INHALATION_SPRAY | Freq: Every day | RESPIRATORY_TRACT | Status: DC
  Administered 2022-03-11 – 2022-03-14 (×4): 1 via RESPIRATORY_TRACT
  Filled 2022-03-10: qty 7

## 2022-03-10 MED ORDER — ONDANSETRON HCL 4 MG/2ML IJ SOLN
INTRAMUSCULAR | Status: DC | PRN
  Administered 2022-03-10: 4 mg via INTRAVENOUS

## 2022-03-10 MED ORDER — TIOTROPIUM BROMIDE MONOHYDRATE 2.5 MCG/ACT IN AERS: 2.0000 | INHALATION_SPRAY | Freq: Every day | RESPIRATORY_TRACT | Status: DC

## 2022-03-10 MED ORDER — FENTANYL CITRATE (PF) 100 MCG/2ML IJ SOLN
INTRAMUSCULAR | Status: DC | PRN
Start: 2022-03-10 — End: 2022-03-10
  Administered 2022-03-10 (×2): 50 ug via INTRAVENOUS

## 2022-03-10 MED ORDER — SODIUM CHLORIDE 0.9 % IV SOLN: INTRAVENOUS | Status: DC

## 2022-03-10 MED ORDER — HEPARIN SODIUM (PORCINE) 1000 UNIT/ML IJ SOLN
INTRAMUSCULAR | Status: DC | PRN
  Administered 2022-03-10: 5000 [IU] via INTRAVENOUS

## 2022-03-10 MED ORDER — PHENYLEPHRINE 40 MCG/ML (10ML) SYRINGE FOR IV PUSH (FOR BLOOD PRESSURE SUPPORT)
PREFILLED_SYRINGE | INTRAVENOUS | Status: DC | PRN
Start: 2022-03-10 — End: 2022-03-10
  Administered 2022-03-10: 80 ug via INTRAVENOUS
  Administered 2022-03-10: 40 ug via INTRAVENOUS

## 2022-03-10 MED ORDER — AMISULPRIDE (ANTIEMETIC) 5 MG/2ML IV SOLN
10.0000 mg | Freq: Once | INTRAVENOUS | Status: AC | PRN
  Administered 2022-03-10: 10 mg via INTRAVENOUS

## 2022-03-10 MED ORDER — MORPHINE SULFATE (PF) 2 MG/ML IV SOLN: 1.0000 mg | INTRAVENOUS | Status: DC | PRN

## 2022-03-10 MED ORDER — IOHEXOL 300 MG/ML  SOLN
100.0000 mL | Freq: Once | INTRAMUSCULAR | Status: AC | PRN
  Administered 2022-03-10: 48 mL via INTRA_ARTERIAL

## 2022-03-10 MED ORDER — ONDANSETRON HCL 4 MG PO TABS: 4.0000 mg | ORAL_TABLET | ORAL | Status: DC | PRN

## 2022-03-10 MED ORDER — PHENYLEPHRINE HCL-NACL 20-0.9 MG/250ML-% IV SOLN
INTRAVENOUS | Status: DC | PRN
  Administered 2022-03-10: 25 ug/min via INTRAVENOUS

## 2022-03-10 MED ORDER — OXYCODONE HCL 5 MG PO TABS: 5.0000 mg | ORAL_TABLET | Freq: Once | ORAL | Status: DC | PRN

## 2022-03-10 MED ORDER — MEPERIDINE HCL 25 MG/ML IJ SOLN: 6.2500 mg | INTRAMUSCULAR | Status: DC | PRN

## 2022-03-10 MED ORDER — LACTATED RINGERS IV SOLN: INTRAVENOUS | Status: DC | PRN

## 2022-03-10 MED ORDER — OXYCODONE HCL 5 MG/5ML PO SOLN: 5.0000 mg | Freq: Once | ORAL | Status: DC | PRN

## 2022-03-10 MED ORDER — SUGAMMADEX SODIUM 200 MG/2ML IV SOLN
INTRAVENOUS | Status: DC | PRN
  Administered 2022-03-10: 150 mg via INTRAVENOUS

## 2022-03-10 MED ORDER — HYDROMORPHONE HCL 1 MG/ML IJ SOLN: 0.2500 mg | INTRAMUSCULAR | Status: DC | PRN

## 2022-03-10 MED ORDER — CHLORHEXIDINE GLUCONATE CLOTH 2 % EX PADS
6.0000 | MEDICATED_PAD | Freq: Every day | CUTANEOUS | Status: DC
  Administered 2022-03-11 – 2022-03-13 (×2): 6 via TOPICAL

## 2022-03-10 MED ORDER — MOMETASONE FURO-FORMOTEROL FUM 100-5 MCG/ACT IN AERO
2.0000 | INHALATION_SPRAY | Freq: Two times a day (BID) | RESPIRATORY_TRACT | Status: DC
  Administered 2022-03-10 – 2022-03-14 (×8): 2 via RESPIRATORY_TRACT
  Filled 2022-03-10: qty 8.8

## 2022-03-10 MED ORDER — CITALOPRAM HYDROBROMIDE 10 MG PO TABS
10.0000 mg | ORAL_TABLET | Freq: Every day | ORAL | Status: DC
  Administered 2022-03-11 – 2022-03-13 (×4): 10 mg via ORAL

## 2022-03-10 MED ORDER — ORAL CARE MOUTH RINSE: 15.0000 mL | Freq: Once | OROMUCOSAL | Status: AC

## 2022-03-10 MED ORDER — IOHEXOL 300 MG/ML  SOLN
100.0000 mL | Freq: Once | INTRAMUSCULAR | Status: DC | PRN
Start: 2022-03-10 — End: 2022-03-11

## 2022-03-10 MED ORDER — BACLOFEN 10 MG PO TABS
10.0000 mg | ORAL_TABLET | Freq: Three times a day (TID) | ORAL | Status: DC | PRN
  Filled 2022-03-10: qty 1

## 2022-03-10 MED ORDER — PROPOFOL 10 MG/ML IV BOLUS
INTRAVENOUS | Status: DC | PRN
  Administered 2022-03-10: 150 mg via INTRAVENOUS

## 2022-03-10 MED ORDER — LIDOCAINE 2% (20 MG/ML) 5 ML SYRINGE
INTRAMUSCULAR | Status: DC | PRN
  Administered 2022-03-10: 60 mg via INTRAVENOUS

## 2022-03-10 MED ORDER — TICAGRELOR 90 MG PO TABS
90.0000 mg | ORAL_TABLET | Freq: Two times a day (BID) | ORAL | Status: DC
  Administered 2022-03-11 – 2022-03-14 (×7): 90 mg via ORAL

## 2022-03-10 MED ORDER — CHLORHEXIDINE GLUCONATE 0.12 % MT SOLN
15.0000 mL | Freq: Once | OROMUCOSAL | Status: AC
  Administered 2022-03-10: 15 mL via OROMUCOSAL

## 2022-03-10 MED ORDER — LABETALOL HCL 5 MG/ML IV SOLN: 10.0000 mg | INTRAVENOUS | Status: DC | PRN

## 2022-03-10 MED ORDER — HYDROCODONE-ACETAMINOPHEN 5-325 MG PO TABS: 1.0000 | ORAL_TABLET | ORAL | Status: DC | PRN

## 2022-03-10 MED ORDER — LACTATED RINGERS IV SOLN: INTRAVENOUS | Status: DC

## 2022-03-10 NOTE — Anesthesia Postprocedure Evaluation (Signed)
Anesthesia Post Note ? ?Patient: Amanda Rich ? ?Procedure(s) Performed: Pipeline Embolization of Right Internal Carotid Artery Aneurysm ? ?  ? ?Patient location during evaluation: PACU ?Anesthesia Type: General ?Level of consciousness: awake and alert ?Pain management: pain level controlled ?Vital Signs Assessment: post-procedure vital signs reviewed and stable ?Respiratory status: spontaneous breathing, nonlabored ventilation and respiratory function stable ?Cardiovascular status: blood pressure returned to baseline and stable ?Postop Assessment: no apparent nausea or vomiting ?Anesthetic complications: no ? ? ?No notable events documented. ? ?Last Vitals:  ?Vitals:  ? 03/10/22 1242 03/10/22 1257  ?BP: 135/75 128/76  ?Pulse: 77 67  ?Resp: 20 17  ?Temp:    ?SpO2: 100% 97%  ?  ?Last Pain:  ?Vitals:  ? 03/10/22 1242  ?PainSc: 0-No pain  ? ? ?  ?  ?  ?  ?  ?  ? ?Lynda Rainwater ? ? ? ? ?

## 2022-03-10 NOTE — Anesthesia Procedure Notes (Signed)
Arterial Line Insertion ?Start/End3/17/2023 9:20 AM, 03/10/2022 9:30 AM ?Performed by: Harden Mo, CRNA, CRNA ? Patient location: Pre-op. ?Preanesthetic checklist: patient identified, IV checked, site marked, risks and benefits discussed, surgical consent, monitors and equipment checked, pre-op evaluation and anesthesia consent ?Lidocaine 1% used for infiltration ?Left, radial was placed ?Catheter size: 20 G ?Hand hygiene performed  and maximum sterile barriers used  ? ?Attempts: 1 ?Procedure performed without using ultrasound guided technique. ?Ultrasound Notes:anatomy identified, needle tip was noted to be adjacent to the nerve/plexus identified and no ultrasound evidence of intravascular and/or intraneural injection ?Following insertion, dressing applied and Biopatch. ?Post procedure assessment: normal and unchanged ? ?Patient tolerated the procedure well with no immediate complications. ? ? ?

## 2022-03-10 NOTE — H&P (Signed)
?Chief Complaint  ?Aneurysm ? ?History of Present Illness  ?Amanda Rich is a 56 y.o. female initially seen at the request of Dr. Mickeal Skinner.  She is referred for recent incidental discovery of cerebral aneurysm.  Briefly, patient was diagnosed with breast cancer about one and half years ago after finding a breast mass.  She underwent neoadjuvant chemotherapy which included Taxol.  Patient subsequently underwent lumpectomy with right-sided axillary lymph node dissection.  Thankfully, she had negative margins on the lumpectomy and nodes were negative for carcinoma.  She did undergo subsequent adjuvant radiation treatments and is currently on maintenance Arimidex therapy.  Unfortunately, likely as a result of the Taxol neoadjuvant therapy, she has complained of peripheral neuropathy including muscle spasms which involve primarily the right leg.  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  Patient was therefore referred for neurosurgical evaluation. Upon questioning, patient does not report any significant headaches or visual changes. ?  ?Of note, the patient does not report any history of hypertension.  She has a history of breast cancer as above.  She also has a history of pericarditis about two years ago possibly related to viral infection which may have been COVID.  No history of heart attack.  She does have a diagnosis of emphysema and does use daily inhalers.  No history of liver or kidney disease.  She is not on any blood thinners or antiplatelet medications.  Patient is a smoker although she is trying to quit.  She does have two cousins on her mother's side with brain aneurysms.  No first degree relatives with brain aneurysms or unexplained intracranial hemorrhage. ? 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  She therefore presents today for the procedure.  She has been on 7 days of aspirin 325 mg and Brilinta 90 mg twice daily.  She has also been pretreated for her contrast allergy with prednisone and Benadryl. ? ?Past Medical History  ? ?Past Medical History:  ?Diagnosis Date  ? Anemia   ? Asthma   ? Cancer [facility])   ? breast cancer  ? Depression   ? Dyspnea   ? Family history of breast cancer   ? Family history of liver cancer   ? History of radiation therapy 03/10/21-04/11/21  ? Right breast- Dr. Gery Pray  ? Pericarditis   ? Smoker   ? Umbilical hernia   ? ? ?Past Surgical History  ? ?Past Surgical History:  ?Procedure Laterality Date  ? ABLATION ON ENDOMETRIOSIS    ? BREAST BIOPSY Right   ? BREAST LUMPECTOMY WITH RADIOACTIVE SEED AND SENTINEL LYMPH NODE BIOPSY Right 02/02/2021  ? Procedure: RIGHT BREAST LUMPECTOMY WITH RADIOACTIVE SEED AND RIGHT SENTINEL LYMPH NODE MAPPING;  Surgeon: [name], MD;  Location: [place];  Service: General;  Laterality: Right;  ? GANGLION CYST EXCISION  12/25/2005  ? L wrist; [facility], [place]  ? IR ANGIO INTRA EXTRACRAN SEL INTERNAL CAROTID BILAT MOD SED  02/06/2022  ? IR ANGIO VERTEBRAL SEL VERTEBRAL BILAT MOD SED  02/06/2022  ? IR IMAGING GUIDED PORT INSERTION  09/10/2020  ? TUBAL LIGATION    ? [facility]  ? UMBILICAL HERNIA REPAIR  12/26/2003  ? [place]  ? ? ?Social History  ? ?Social History  ? ?  Tobacco Use  ? Smoking status: Every Day  ?  Packs/day: 1.00  ?  Years: 25.00  ?  Pack years: 25.00  ?  Types: Cigarettes  ? Smokeless tobacco: Never  ? Tobacco comments:  ?  Using Nicotine Patch 03/06/22  ?Vaping Use  ? Vaping Use: Never used  ?Substance Use Topics  ? Alcohol use: Not Currently  ?  Comment: rarely  ? Drug use: No  ? ? ?Medications  ? ?Prior to Admission medications   ?Medication Sig Start Date End Date Taking? Authorizing Provider  ?albuterol (PROAIR HFA) 108 (90 Base) MCG/ACT  inhaler Inhale 2 puffs into the lungs every 6 (six) hours as needed for wheezing or shortness of breath. 01/05/22  Yes Denita Lung, MD  ?anastrozole (ARIMIDEX) 1 MG tablet Take 1 tablet (1 mg total) by mouth daily. 08/30/21  Yes Nicholas Lose, MD  ?baclofen (LIORESAL) 10 MG tablet Take 1 tablet (10 mg total) by mouth 3 (three) times daily as needed for muscle spasms. 01/25/22  Yes Vaslow, Acey Lav, MD  ?budesonide-formoterol (SYMBICORT) 80-4.5 MCG/ACT inhaler Inhale 2 puffs into the lungs in the morning and at bedtime. 06/15/21  Yes Hunsucker, Bonna Gains, MD  ?Calcium-Vitamin D-Vitamin K (VIACTIV PO) Take 1 tablet by mouth daily.   Yes [provider]  ?Cholecalciferol (VITAMIN D) 50 MCG (2000 UT) tablet Take 2,000 Units by mouth daily.   Yes [provider]  ?citalopram (CELEXA) 10 MG tablet Take 1 tablet (10 mg total) by mouth daily. ?Patient taking differently: Take 10 mg by mouth at bedtime. 01/16/22  Yes Nicholas Lose, MD  ?diphenhydrAMINE (BENADRYL) 25 mg capsule Take 2 capsules by mouth 1 hour prior to scheduled procedure 02/13/22  Yes   ?gabapentin (NEURONTIN) 300 MG capsule Take 300 mg by mouth 2 (two) times daily.   Yes [provider]  ?nicotine (NICODERM CQ - DOSED IN MG/24 HR) 7 mg/24hr patch PLACE 1 PATCH ONTO THE SKIN DAILY. 12/29/21 12/29/22 Yes Hunsucker, Bonna Gains, MD  ?predniSONE (DELTASONE) 50 MG tablet Take 1 tablet by mouth 13, 7, and 1 hour prior to scheduled procedure 02/13/22  Yes   ?ticagrelor (BRILINTA) 90 MG TABS tablet Take 1 tablet (90 mg total) by mouth 2 (two) times daily. 02/13/22  Yes   ?Tiotropium Bromide Monohydrate (SPIRIVA RESPIMAT) 2.5 MCG/ACT AERS Inhale 2 puffs into the lungs daily. 09/22/21  Yes Hunsucker, Bonna Gains, MD  ?aspirin 81 MG chewable tablet Chew 1 tablet by mouth daily (start taking 7 days prior to scheduled procedure) 02/13/22     ?cholecalciferol (VITAMIN D3) 25 MCG (1000 UNIT) tablet Take 2 tablets (2,000 Units total) by mouth daily. 01/25/22    Nicholas Lose, MD  ?pregabalin (LYRICA) 75 MG capsule Take 1 capsule by mouth 2  times daily. ?Patient not taking: Reported on 03/01/2022 02/21/22   Ventura Sellers, MD  ?prochlorperazine (COMPAZINE) 10 MG tablet Take 1 tablet (10 mg total) by mouth every 6 (six) hours as needed (Nausea or vomiting). 09/01/20 01/31/21  Nicholas Lose, MD  ? ? ?Allergies  ? ?Allergies  ?Allergen Reactions  ? Clarithromycin Swelling  ?  Biaxin  ? Lyrica [Pregabalin]   ?  Oversedated   ? Tape   ?  Rash from Tegaderm  ? Ivp Dye [Iodinated Contrast Media] Rash  ? ? ?Review of Systems  ?ROS ? ?Neurologic Exam  ?Awake, alert, oriented ?Memory and concentration grossly intact ?Speech fluent, appropriate ?CN grossly intact ?Motor exam: ?Upper Extremities Deltoid Bicep Tricep Grip  ?  Right 5/5 5/5 5/5 5/5  ?Left 5/5 5/5 5/5 5/5  ? ?Lower Extremities IP Quad PF DF EHL  ?Right 5/5 5/5 5/5 5/5 5/5  ?Left 5/5 5/5 5/5 5/5 5/5  ? ?Sensation grossly intact to LT ? 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 ?Impression  ?- 56 y.o. 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 ?Plan  ?-We will plan on proceeding with pipeline embolization of the right internal carotid artery aneurysm ? ?I have reviewed the details of the procedure as well as the expected postoperative course and recovery in detail with the patient in the office.  We have also discussed the associated risks, benefits, and alternatives to surgery.  All her questions today were answered.  She provided informed consent to proceed. ? ?Consuella Lose, MD ?[facility]  ? ?

## 2022-03-10 NOTE — Transfer of Care (Signed)
Immediate Anesthesia Transfer of Care Note ? ?Patient: Amanda Rich ? ?Procedure(s) Performed: Pipeline Embolization of Right Internal Carotid Artery Aneurysm ? ?Patient Location: PACU ? ?Anesthesia Type:General ? ?Level of Consciousness: awake, alert  and oriented ? ?Airway & Oxygen Therapy: Patient Spontanous Breathing ? ?Post-op Assessment: Report given to RN, Post -op Vital signs reviewed and stable and Patient moving all extremities X 4 ? ?Post vital signs: Reviewed and stable ? ?Last Vitals:  ?Vitals Value Taken Time  ?BP 130/92 03/10/22 1214  ?Temp    ?Pulse 75 03/10/22 1217  ?Resp 17 03/10/22 1217  ?SpO2 100 % 03/10/22 1217  ?Vitals shown include unvalidated device data. ? ?Last Pain:  ?Vitals:  ? 03/10/22 0857  ?PainSc: 0-No pain  ?   ? ?  ? ?Complications: No notable events documented. ?

## 2022-03-10 NOTE — Anesthesia Procedure Notes (Signed)
Procedure Name: Intubation ?Date/Time: 03/10/2022 10:19 AM ?Performed by: Harden Mo, CRNA ?Pre-anesthesia Checklist: Patient identified, Emergency Drugs available, Suction available and Patient being monitored ?Patient Re-evaluated:Patient Re-evaluated prior to induction ?Oxygen Delivery Method: Circle System Utilized ?Preoxygenation: Pre-oxygenation with 100% oxygen ?Induction Type: IV induction ?Ventilation: Mask ventilation without difficulty ?Laryngoscope Size: Sabra Heck and 2 ?Grade View: Grade I ?Tube type: Oral ?Tube size: 7.5 mm ?Number of attempts: 1 ?Airway Equipment and Method: Stylet and Oral airway ?Placement Confirmation: ETT inserted through vocal cords under direct vision, positive ETCO2 and breath sounds checked- equal and bilateral ?Secured at: 21 cm ?Tube secured with: Tape ?Dental Injury: Teeth and Oropharynx as per pre-operative assessment  ? ? ? ? ?

## 2022-03-10 NOTE — Brief Op Note (Signed)
?  NEUROSURGERY BRIEF OPERATIVE  NOTE  ? ?PREOP DX: RICA aneurysm ? ?POSTOP DX: Same ? ?PROCEDURE: Pipeline embolization of RICA aneurysm ? ?SURGEON: Dr. Consuella Lose, MD ? ?ANESTHESIA: GETA ? ?EBL: Minimal ? ?SPECIMENS: None ? ?COMPLICATIONS: None ? ?CONDITION: Stable to recovery ? ?FINDINGS (Full report in [facility]): ?1. Successful Pipeline embolization of RICA aneurysm ? ? ?Consuella Lose, MD ?[facility]  ? ?

## 2022-03-10 NOTE — Progress Notes (Signed)
Belongings at admission on arrival to 4N ICU:  ?[place] ?Jeans ?Pearline Cables sweatshirt ?Glasses ?Pink medical bracelet ?Black slide shoes ?Upper plate dentures ?Blue pill case  ? ?Phone with fiace per patient  ?

## 2022-03-10 NOTE — Progress Notes (Signed)
?  Transition of Care (TOC) Screening Note ? ? ?Patient Details  ?Name: Amanda Rich ?Date of Birth: Apr 09, 1966 ? ? ?Transition of Care (TOC) CM/SW Contact:    ?Benard Halsted, LCSW ?Phone Number: ?03/10/2022, 5:45 PM ? ? ? ?Transition of Care Department [facility]) has reviewed patient and no TOC needs have been identified at this time. We will continue to monitor patient advancement through interdisciplinary progression rounds. If new patient transition needs arise, please place a TOC consult. ? ? ?

## 2022-03-11 MED ORDER — PANTOPRAZOLE SODIUM 40 MG PO TBEC
40.0000 mg | DELAYED_RELEASE_TABLET | Freq: Every day | ORAL | Status: DC
  Administered 2022-03-11 – 2022-03-13 (×3): 40 mg via ORAL

## 2022-03-11 NOTE — Progress Notes (Signed)
PHARMACIST - PHYSICIAN COMMUNICATION ? ?DR:   Kathyrn Sheriff ? ?CONCERNING: IV to Oral Route Change Policy ? ?RECOMMENDATION: ?This patient is receiving protonix by the intravenous route.  Based on criteria approved by the Pharmacy and Therapeutics Committee, the intravenous medication(s) is/are being converted to the equivalent oral dose form(s). ? ? ?DESCRIPTION: ?These criteria include: ?The patient is eating (either orally or via tube) and/or has been taking other orally administered medications for a least 24 hours ?The patient has no evidence of active gastrointestinal bleeding or impaired GI absorption (gastrectomy, short bowel, patient on TNA or NPO). ? ?If you have questions about this conversion, please contact the Pharmacy Department  ?'[]'$   [phone] )  [facility] ?'[]'$   [phone] )  [facility] ?'[x]'$   [phone] )  [facility] ?'[]'$   [phone] )  [facility] ?'[]'$   [phone] )  [facility]  ? ? ? ?

## 2022-03-11 NOTE — Progress Notes (Signed)
Patient ID: Amanda Rich, female   DOB: 06/01/1966, 56 y.o.   MRN: [id number] ?Vital signs are stable ?Patient has just recently gotten out of bed and ambulated.  She feels a bit dizzy and lightheaded though her vital signs are stable.  She is voiding and starting to take a diet.  Have advised physical therapy and Occupational Therapy evaluate her and if she feels improved she could possibly be discharged later on this afternoon.  If not we will keep her overnight to allow her more recovery from her endovascular procedure. ?

## 2022-03-11 NOTE — Evaluation (Signed)
Physical Therapy Evaluation ?Patient Details ?Name: Amanda Rich ?MRN: [id number] ?DOB: 1966/02/26 ?Today's Date: 03/11/2022 ? ?History of Present Illness ? 56 yo F presented 03/10/22 for s/p Pipeline embolization of RICA aneurysm.  PMH includes asthma, anemia, depression, breast cancer s/p chemo, pericarditis ?  ?Clinical Impression ? Pt presents with condition above and deficits mentioned below, see PT Problem List. PTA, she was IND without DME, living with her husband in a 1-level house with 3 STE. Her husband works 3rd shift, so she is home alone during the night and can get PRN assistance during the day as her husband sleeps. Currently, pt is displaying deficits in sequencing and problem-solving along with deficits in balance, activity tolerance, and L lower extremity strength, sensation, and coordination. Notified RN and MD of L-sided deficits. Pt also reporting feeling lightheaded/dizzy, which worsened with changing head positions and BP remained stable, see General Comments below for measurements. Pt appeared to have her symptoms worsen with bil Dix Hallpike tests, but significantly more so on the L side. Treated with x1 L-sided Epley maneuver with pt reporting a slight improvement in symptoms during session. Pt may benefit from further Vestibular assessment. Despite her deficits above, pt is able to ambulate household distances with a RW at a min guard assist level. Recommending follow-up with HHPT. Will continue to follow acutely.  ? ?Recommendations for follow up therapy are one component of a multi-disciplinary discharge planning process, led by the attending physician.  Recommendations may be updated based on patient status, additional functional criteria and insurance authorization. ? ?Follow Up Recommendations Home health PT ? ?  ?Assistance Recommended at Discharge Intermittent Supervision/Assistance  ?Patient can return home with the following ? Help with stairs or ramp for entrance;Assist for  transportation;Assistance with cooking/housework ? ?  ?Equipment Recommendations Rolling walker (2 wheels);Other (comment) (tub/shower seat)  ?Recommendations for Other Services ?    ?  ?Functional Status Assessment Patient has had a recent decline in their functional status and demonstrates the ability to make significant improvements in function in a reasonable and predictable amount of time.  ? ?  ?Precautions / Restrictions Precautions ?Precautions: Fall ?Restrictions ?Weight Bearing Restrictions: No  ? ?  ? ?Mobility ? Bed Mobility ?Overal bed mobility: Needs Assistance ?Bed Mobility: Rolling, Sidelying to Sit, Sit to Supine ?Rolling: Supervision ?Sidelying to sit: Min assist ?  ?Sit to supine: Min guard ?  ?General bed mobility comments: Cues for pt to lay supine, roll, and sit up to test for BPPV and attempt Epley maneuver, providing light minA for increased comfort coming up to sit. ?  ? ?Transfers ?Overall transfer level: Needs assistance ?Equipment used: Rolling walker (2 wheels) ?Transfers: Sit to/from Stand ?Sit to Stand: Min guard ?  ?  ?  ?  ?  ?General transfer comment: Cues for hand positioning, min guard for safety. ?  ? ?Ambulation/Gait ?Ambulation/Gait assistance: Min guard ?Gait Distance (Feet): 115 Feet (x3 bouts of ~5 ft > ~5 ft > ~115 ft) ?Assistive device: Rolling walker (2 wheels) ?Gait Pattern/deviations: Step-through pattern, Decreased stance time - left, Decreased stride length, Decreased dorsiflexion - right, Decreased dorsiflexion - left, Narrow base of support ?Gait velocity: reduced ?Gait velocity interpretation: <1.31 ft/sec, indicative of household ambulator ?  ?General Gait Details: Pt with narrow BOS, even more so as she fatigued along with decreased feet clearance as she fatigued. Cues provided to widen BOS with mod success. Pt with decreased stance time on L leg also. No LOB, but pt with slow pace  using RW, min guard for safety. ? ?Stairs ?  ?  ?  ?  ?  ? ?Wheelchair Mobility ?   ? ?Modified Rankin (Stroke Patients Only) ?Modified Rankin (Stroke Patients Only) ?Pre-Morbid Rankin Score: No significant disability ?Modified Rankin: Moderate disability ? ?  ? ?Balance Overall balance assessment: Needs assistance ?Sitting-balance support: No upper extremity supported, Feet supported ?Sitting balance-Leahy Scale: Good ?  ?  ?Standing balance support: Bilateral upper extremity supported, During functional activity, Reliant on assistive device for balance ?Standing balance-Leahy Scale: Poor ?Standing balance comment: Reliant on RW ?  ?  ?  ?  ?  ?  ?  ?  ?  ?  ?  ?   ? ? ? ?Pertinent Vitals/Pain Pain Assessment ?Pain Assessment: Faces ?Faces Pain Scale: Hurts a little bit ?Pain Location: R thigh ?Pain Descriptors / Indicators: Sore ?Pain Intervention(s): Limited activity within patient's tolerance, Monitored during session, Repositioned  ? ? ?Home Living Family/patient expects to be discharged to:: Private residence ?Living Arrangements: Spouse/significant other ?Available Help at Discharge: Family;Available PRN/intermittently ?Type of Home: House ?Home Access: Stairs to enter ?Entrance Stairs-Rails: None ?Entrance Stairs-Number of Steps: 3 ?  ?Home Layout: One level ?Home Equipment: None ?   ?  ?Prior Function Prior Level of Function : Independent/Modified Independent;Driving ?  ?  ?  ?  ?  ?  ?Mobility Comments: Does not use AD. ?ADLs Comments: Generally needing no assist for ADL/IADL.  Occasional assist with driving. ?  ? ? ?Hand Dominance  ? Dominant Hand: Right ? ?  ?Extremity/Trunk Assessment  ? Upper Extremity Assessment ?Upper Extremity Assessment: Defer to OT evaluation ?  ? ?Lower Extremity Assessment ?Lower Extremity Assessment: LLE deficits/detail ?LLE Deficits / Details: MMT scores of 3 hip flexion, 4- knee extension, 4 knee flexion, 4- ankle dorsiflexion; able to detect touch but not accurate with loaction of touch throughout leg; dysmetria and dysdiadochokinesia noted (MMT scores on  R for comparison were 4+ hip flexion, 5 knee extension, 4+ knee flexion, 5 ankle dorsiflexion; accurate detection of touch; coordination intact) ?LLE Sensation: decreased light touch;decreased proprioception ?LLE Coordination: decreased fine motor;decreased gross motor ?  ? ?Cervical / Trunk Assessment ?Cervical / Trunk Assessment: Normal  ?Communication  ? Communication: No difficulties  ?Cognition Arousal/Alertness: Awake/alert ?Behavior During Therapy: [facility] for tasks assessed/performed ?Overall Cognitive Status: Impaired/Different from baseline ?Area of Impairment: Following commands, Safety/judgement, Problem solving ?  ?  ?  ?  ?  ?  ?  ?  ?  ?  ?  ?Following Commands: Follows one step commands consistently, Follows one step commands with increased time, Follows multi-step commands inconsistently ?Safety/Judgement: Decreased awareness of deficits ?  ?Problem Solving: Slow processing, Difficulty sequencing, Requires verbal cues ?General Comments: Pt having difficulty processing and problem-solving simple tasks at times, stating "I don't know how" when cued to "turn and back up to sit in chair", needing repeated multi-modal cues. Decreased awareness of feet positioning impacting safety. ?  ?  ? ?  ?General Comments General comments (skin integrity, edema, etc.): BP stable with changes in position: 116/68 reclined, 119/70 sitting, 117/82 standing, 114/100 standing ~2 min, 130/84 walking; dizziness noted to worsen with changes in head position, reproduced with [employer] bil, but more so on L; Performed L Epley maneuver with pt reporting slight improvement in symptoms ? ?  ?Exercises    ? ?Assessment/Plan  ?  ?PT Assessment Patient needs continued PT services  ?PT Problem List Decreased strength;Decreased activity tolerance;Decreased balance;Decreased mobility;Decreased coordination;Decreased cognition;Decreased  safety awareness;Decreased knowledge of use of DME;Impaired sensation ? ?   ?  ?PT Treatment  Interventions DME instruction;Gait training;Stair training;Functional mobility training;Therapeutic activities;Therapeutic exercise;Balance training;Neuromuscular re-education;Cognitive remediation;Patient/family e

## 2022-03-11 NOTE — Evaluation (Signed)
Occupational Therapy Evaluation ?Patient Details ?Name: Amanda Rich ?MRN: [id number] ?DOB: [birth year] ?Today's Date: 03/11/2022 ? ? 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  PMH includes Cancer.  ? ?Clinical Impression ?  ?Patient admitted for the above diagnosis.  PTA she lives at home with her finance.  Her SO works third shift, so she is home alone at night, and needs to by fairly independent during the daytime hours.  Her children all work, and cannot provide adequate assist at home.  Currently she is unsteady, feeling weak, and has fair activity tolerance.  OT will follow in the acute setting, but HH OT and a shower chair are recommended for home use.    ?   ? ?Recommendations for follow up therapy are one component of a multi-disciplinary discharge planning process, led by the attending physician.  Recommendations may be updated based on patient status, additional functional criteria and insurance authorization.  ? ?Follow Up Recommendations ? Home health OT  ?  ?Assistance Recommended at Discharge Intermittent Supervision/Assistance  ?Patient can return home with the following   ? ?  ?Functional Status Assessment ? Patient has had a recent decline in their functional status and demonstrates the ability to make significant improvements in function in a reasonable and predictable amount of time.  ?Equipment Recommendations ? Tub/shower seat  ?  ?Recommendations for Other Services   ? ? ?  ?Precautions / Restrictions Precautions ?Precautions: Fall ?Restrictions ?Weight Bearing Restrictions: No  ? ?  ? ?Mobility Bed Mobility ?  ?  ?  ?  ?  ?  ?  ?General bed mobility comments: up in recliner ?  ? ?Transfers ?Overall transfer level: Needs assistance ?Equipment used: Rolling walker (2 wheels) ?Transfers: Sit to/from Stand, Bed to chair/wheelchair/BSC ?Sit to Stand: Min guard ?  ?  ?Step pivot transfers: Min guard ?  ?  ?  ?  ? ?  ?Balance Overall balance assessment: Needs  assistance ?Sitting-balance support: Feet supported ?Sitting balance-Leahy Scale: Good ?  ?  ?Standing balance support: Reliant on assistive device for balance ?Standing balance-Leahy Scale: Poor ?  ?  ?  ?  ?  ?  ?  ?  ?  ?  ?  ?  ?   ? ?ADL either performed or assessed with clinical judgement  ? ?ADL   ?  ?  ?Grooming: Wash/dry hands;Oral care;Min guard;Standing ?  ?  ?  ?  ?  ?  ?  ?Lower Body Dressing: Min guard;Sit to/from stand ?  ?Toilet Transfer: Min guard;Rolling walker (2 wheels);Ambulation ?  ?  ?  ?  ?  ?  ?   ? ? ? ?Vision Baseline Vision/History: 1 Wears glasses ?Patient Visual Report: No change from baseline ?   ?   ?Perception Perception ?Perception: Within Functional Limits ?  ?Praxis Praxis ?Praxis Impairment Details: Motor planning ?  ? ?Pertinent Vitals/Pain Pain Assessment ?Pain Assessment: Faces ?Faces Pain Scale: Hurts a little bit ?Pain Location: R thigh ?Pain Descriptors / Indicators: Sore ?Pain Intervention(s): Monitored during session  ? ? ? ?Hand Dominance Right ?  ?Extremity/Trunk Assessment Upper Extremity Assessment ?Upper Extremity Assessment: Generalized weakness ?  ?Lower Extremity Assessment ?Lower Extremity Assessment: Defer to PT evaluation ?  ?Cervical / Trunk Assessment ?Cervical / Trunk Assessment: Normal ?  ?Communication Communication ?Communication: No difficulties ?  ?Cognition Arousal/Alertness: Awake/alert ?Behavior During Therapy: [facility] for tasks assessed/performed ?Overall Cognitive Status: Impaired/Different from baseline ?Area of Impairment: Following commands,  Problem solving ?  ?  ?  ?  ?  ?  ?  ?  ?  ?  ?  ?Following Commands: Follows one step commands with increased time, Follows multi-step commands with increased time ?  ?  ?Problem Solving: Slow processing ?  ?  ?  ?General Comments   VSS on RA ? ?  ?Exercises   ?  ?Shoulder Instructions    ? ? ?Home Living Family/patient expects to be discharged to:: Private residence ?Living Arrangements: Spouse/significant  other ?Available Help at Discharge: Family;Available PRN/intermittently ?Type of Home: House ?Home Access: Stairs to enter ?Entrance Stairs-Number of Steps: 3 ?Entrance Stairs-Rails: None ?Home Layout: One level ?  ?  ?Bathroom Shower/Tub: Tub/shower unit ?  ?Bathroom Toilet: Standard ?Bathroom Accessibility: Yes ?How Accessible: Accessible via walker ?Home Equipment: None ?  ?  ?  ? ?  ?Prior Functioning/Environment Prior Level of Function : Independent/Modified Independent;Driving ?  ?  ?  ?  ?  ?  ?  ?ADLs Comments: Generally needing no assist for ADL/IADL.  Occasional assist with driving. ?  ? ?  ?  ?OT Problem List: Decreased strength;Decreased activity tolerance;Impaired balance (sitting and/or standing) ?  ?   ?OT Treatment/Interventions: Self-care/ADL training;Therapeutic activities;Therapeutic exercise;Balance training  ?  ?OT Goals(Current goals can be found in the care plan section) Acute Rehab OT Goals ?Patient Stated Goal: Get back to feeling better ?OT Goal Formulation: With patient ?Time For Goal Achievement: 03/24/22 ?Potential to Achieve Goals: Good  ?OT Frequency: Min 2X/week ?  ? ?Co-evaluation   ?  ?  ?  ?  ? ?  ?AM-PAC OT "6 Clicks" Daily Activity     ?Outcome Measure Help from another person eating meals?: None ?Help from another person taking care of personal grooming?: A Little ?Help from another person toileting, which includes using toliet, bedpan, or urinal?: A Little ?Help from another person bathing (including washing, rinsing, drying)?: A Little ?Help from another person to put on and taking off regular upper body clothing?: A Little ?Help from another person to put on and taking off regular lower body clothing?: A Little ?6 Click Score: 19 ?  ?End of Session Equipment Utilized During Treatment: Gait belt;Rolling walker (2 wheels) ?Nurse Communication: Mobility status ? ?Activity Tolerance: Patient tolerated treatment well ?Patient left: in chair;with call bell/phone within reach;with  family/visitor present ? ?OT Visit Diagnosis: Unsteadiness on feet (R26.81);Muscle weakness (generalized) (M62.81)  ?              ?Time: 1030-1055 ?OT Time Calculation (min): 25 min ?Charges:  OT General Charges ?$OT Visit: 1 Visit ?OT Evaluation ?$OT Eval Moderate Complexity: 1 Mod ?OT Treatments ?$Self Care/Home Management : 8-22 mins ? ?03/11/2022 ? ?RP, OTR/L ? ?Acute Rehabilitation Services ? ?Office:  [phone] ? ? ?Alizza Sacra D Magen Suriano ?03/11/2022, 11:05 AM ?

## 2022-03-12 ENCOUNTER — Inpatient Hospital Stay (HOSPITAL_COMMUNITY): Payer: Medicaid Other

## 2022-03-12 IMAGING — CT CT HEAD W/O CM
3 series · 16 of 47 positions shown, 19 images · non-contrast
Comparison: [DATE].

CLINICAL DATA: Neuro deficit, acute, stroke suspected CT head



[Series 4: head 5.0 h30s · axial · 0.41mm/px · z∈[-58,+67]mm · 10 of 31 slices shown, 13 images]
[im 3/31  brain]
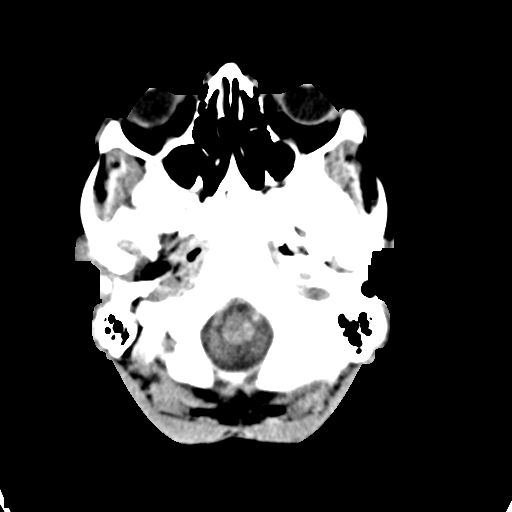
[im 3/31  bone]
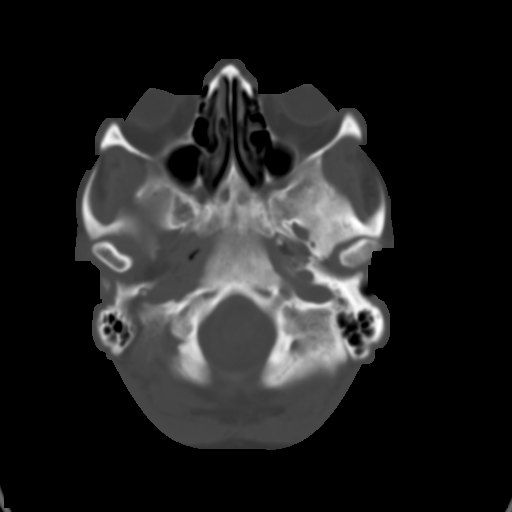
[im 6/31  brain]
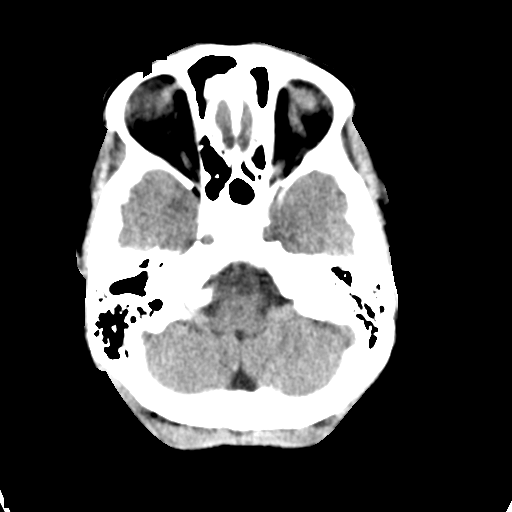
[im 9/31  brain]
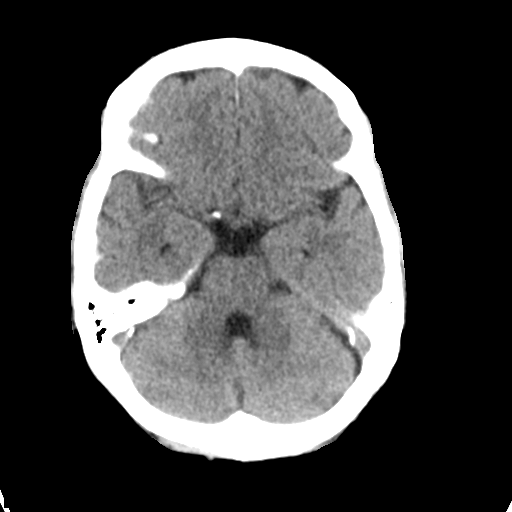
[im 11/31  brain]
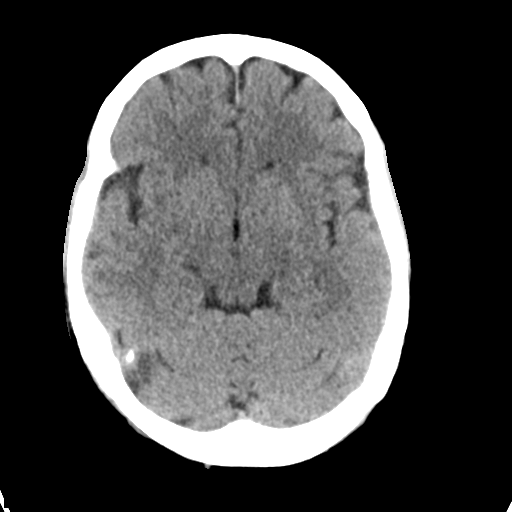
[im 14/31  brain]
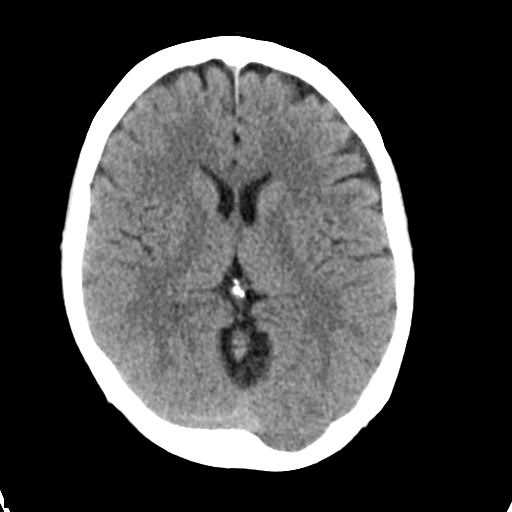
[im 14/31  bone]
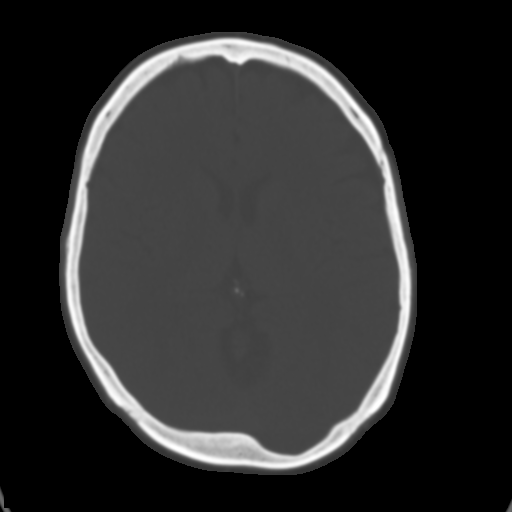
[im 17/31  brain]
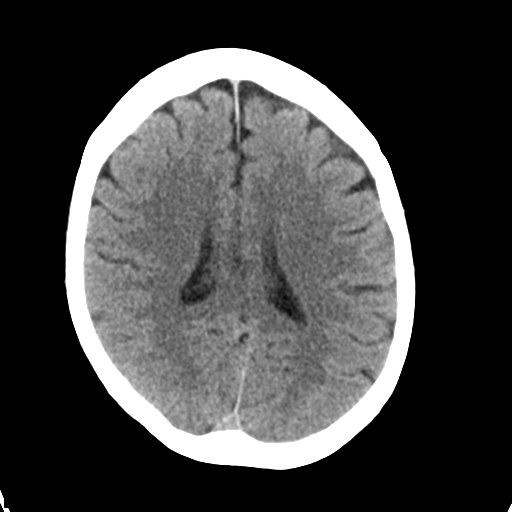
[im 20/31  brain]
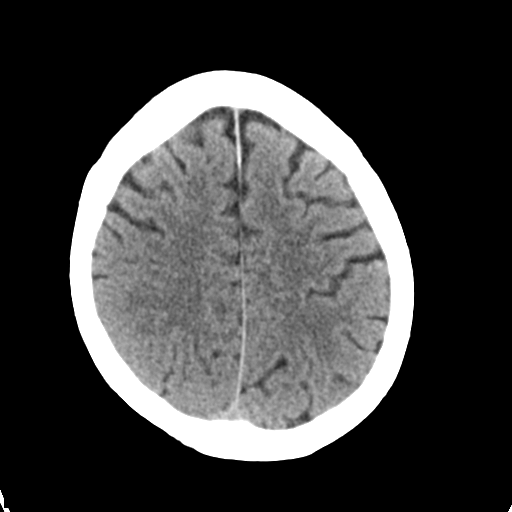
[im 23/31  brain]
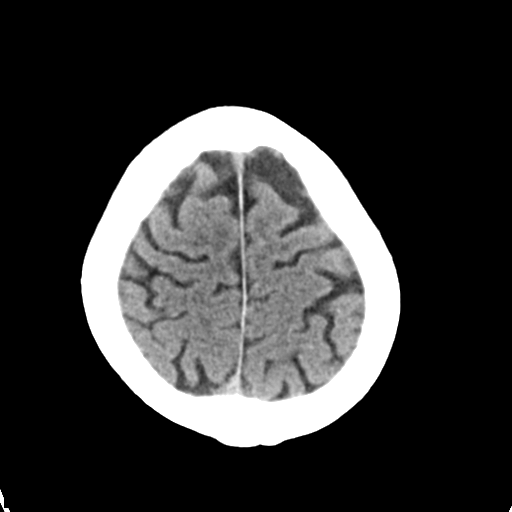
[im 25/31  brain]
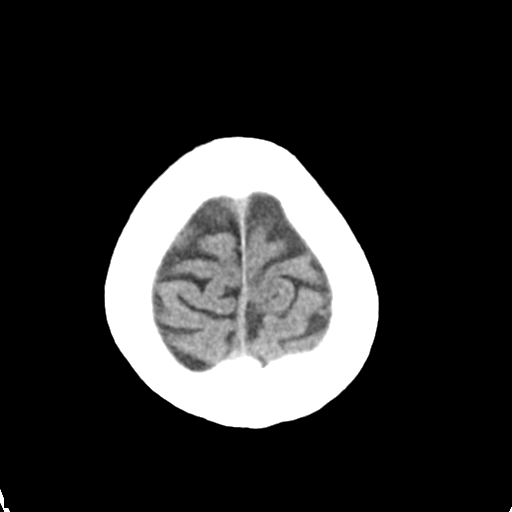
[im 25/31  bone]
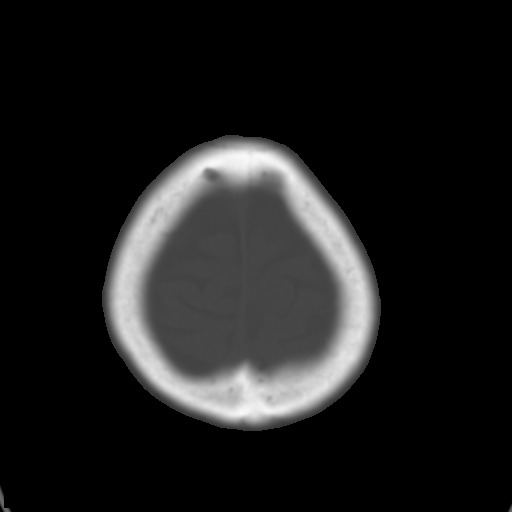
[im 28/31  brain]
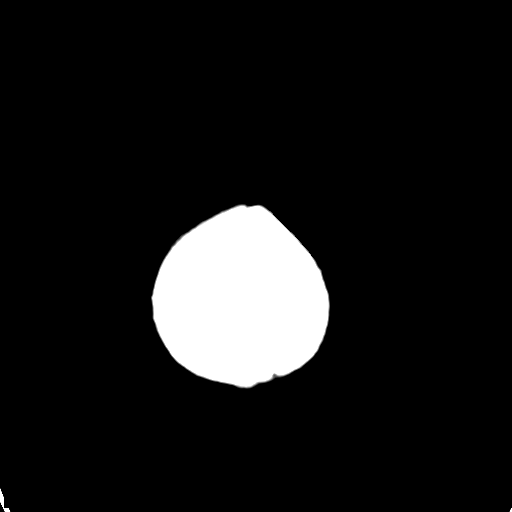

[Series 5: head 3.0 mpr cor · coronal · 0.31mm/px · 3 of 65 slices shown]
[im 22/65  brain]
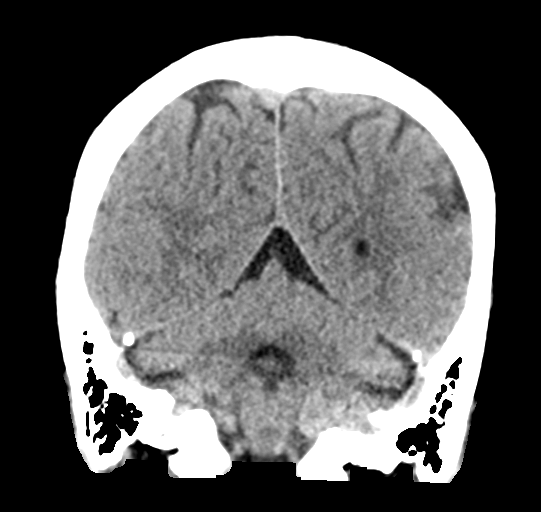
[im 29/65  brain]
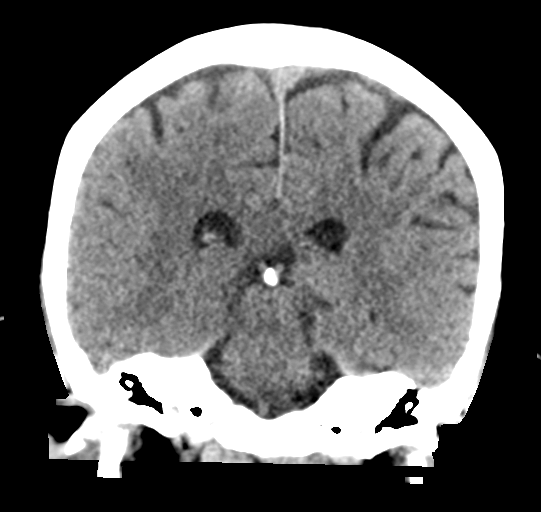
[im 36/65  brain]
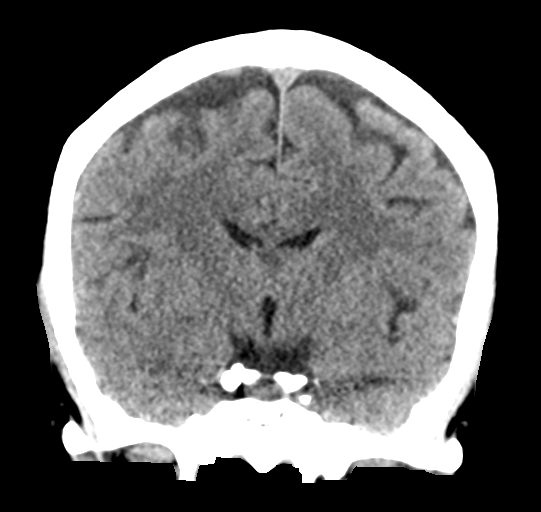

[Series 6: head 3.0 mpr sag · sagittal · 0.30mm/px · 3 of 55 slices shown]
[im 19/55  brain]
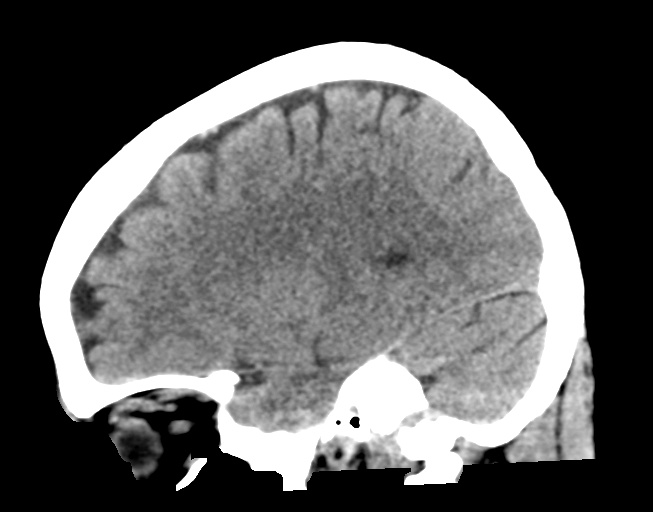
[im 28/55  brain]
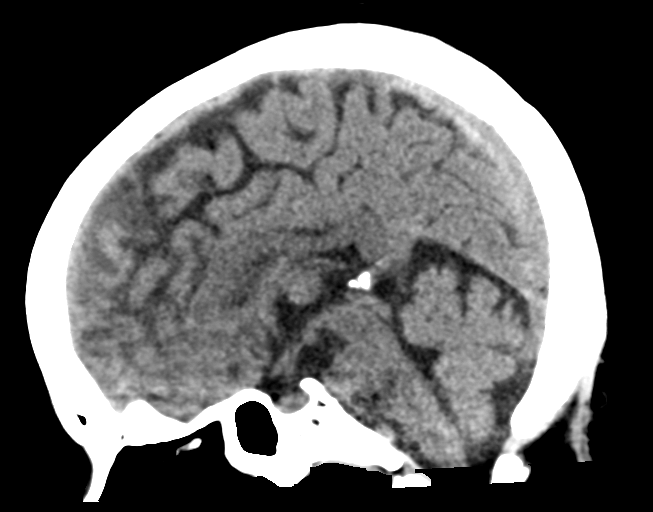
[im 37/55  brain]
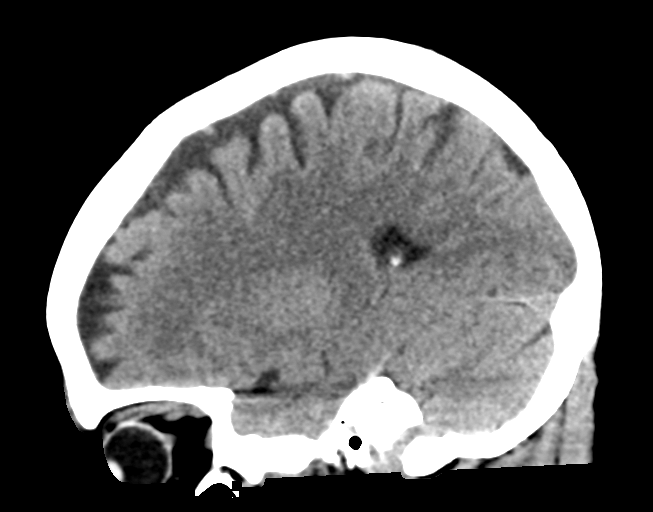

[16 of 47 positions shown; findings below may reference images not displayed]

FINDINGS: Brain: No evidence of acute large vascular territory infarction,
hemorrhage, hydrocephalus, extra-axial collection or mass
lesion/mass effect.

Vascular: Right paraclinoid ICA stent. No hyperdense vessel
identified.

Skull: No acute fracture.

Sinuses/Orbits: Clear sinuses.  Unremarkable orbits.

Other: No mastoid effusions.
IMPRESSION: No evidence of acute intracranial abnormality.

## 2022-03-12 NOTE — Progress Notes (Signed)
Physical Therapy Treatment ?Patient Details ?Name: Amanda Rich ?MRN: [id number] ?DOB: 1966-04-11 ?Today's Date: 03/12/2022 ? ? ?History of Present Illness 56 yo F presented 03/10/22 for s/p Pipeline embolization of RICA aneurysm.  PMH includes asthma, anemia, depression, breast cancer s/p chemo, pericarditis ? ?  ?PT Comments  ? ? The pt was agreeable to session, endorses concern regarding d/c home as she is fearful of falling. The pt continues to endorse L-sided weakness and coordination deficits of both UE and LE. She performs coordination and fine motor tasks with increased time and effort, often dysmetric with both UE and LE when reaching for targets. The pt was able to progress hallway ambulation today, but continues to rely on RW for support and minA at times to steady. She uses slow narrow base of support with progressively shorter steps with LLE resulting in step-to pattern. Given persistence of deficits and pt's prior level of independence, I have updated the recommendation to AIR at this time and the pt is in agreement.  ?  ?Recommendations for follow up therapy are one component of a multi-disciplinary discharge planning process, led by the attending physician.  Recommendations may be updated based on patient status, additional functional criteria and insurance authorization. ? ?Follow Up Recommendations ? Acute inpatient rehab (3hours/day) ?  ?  ?Assistance Recommended at Discharge Frequent or constant Supervision/Assistance  ?Patient can return home with the following Help with stairs or ramp for entrance;Assist for transportation;Assistance with cooking/housework;A little help with walking and/or transfers;A little help with bathing/dressing/bathroom ?  ?Equipment Recommendations ? Rolling walker (2 wheels);Other (comment) (tub/shower seat)  ?  ?Recommendations for Other Services Rehab consult ? ? ?  ?Precautions / Restrictions Precautions ?Precautions: Fall ?Restrictions ?Weight Bearing  Restrictions: No  ?  ? ?Mobility ? Bed Mobility ?Overal bed mobility: Needs Assistance ?Bed Mobility: Rolling, Sidelying to Sit ?Rolling: Supervision ?Sidelying to sit: Min assist ?  ?  ?  ?General bed mobility comments: minA to complete, pt using bed rails to pull to sitting and needs increased time and effort to scoot to EOB ?  ? ?Transfers ?Overall transfer level: Needs assistance ?Equipment used: Rolling walker (2 wheels) ?Transfers: Sit to/from Stand ?Sit to Stand: Min guard ?  ?  ?  ?  ?  ?General transfer comment: minG for sit-stand transfers with cues for hand placement, then completed x5 sit-stand from recliner with RLE in front of left to increase wt bearing on LLE, pt then needing minA ?  ? ?Ambulation/Gait ?Ambulation/Gait assistance: Min guard ?Gait Distance (Feet): 125 Feet ?Assistive device: Rolling walker (2 wheels) ?Gait Pattern/deviations: Step-through pattern, Decreased stance time - left, Decreased stride length, Decreased dorsiflexion - right, Decreased dorsiflexion - left, Narrow base of support, Step-to pattern, Decreased step length - left, Shuffle ?Gait velocity: reduced ?  ?  ?General Gait Details: pt with narrow BOS and progressively shorter strides with LLE despite encouragement to maintain step-through pattern. cues for posture and positioning in RW ? ? ?Modified Rankin (Stroke Patients Only) ?Modified Rankin (Stroke Patients Only) ?Pre-Morbid Rankin Score: No significant disability ?Modified Rankin: Moderately severe disability ? ? ?  ?Balance Overall balance assessment: Needs assistance ?Sitting-balance support: No upper extremity supported, Feet supported ?Sitting balance-Leahy Scale: Good ?  ?  ?Standing balance support: Bilateral upper extremity supported, During functional activity, Reliant on assistive device for balance ?Standing balance-Leahy Scale: Poor ?Standing balance comment: Reliant on RW ?  ?  ?  ?  ?  ?  ?  ?  ?  ?  ?  ?  ? ?  ?  Cognition Arousal/Alertness:  Awake/alert ?Behavior During Therapy: [facility] for tasks assessed/performed ?Overall Cognitive Status: Impaired/Different from baseline ?Area of Impairment: Safety/judgement, Problem solving, Attention, Memory, Following commands, Awareness ?  ?  ?  ?  ?  ?  ?  ?  ?  ?Current Attention Level: Sustained ?Memory: Decreased short-term memory ?Following Commands: Follows one step commands consistently, Follows one step commands with increased time, Follows multi-step commands inconsistently ?Safety/Judgement: Decreased awareness of deficits ?Awareness: Intellectual ?Problem Solving: Slow processing, Difficulty sequencing, Requires verbal cues ?General Comments: pt with difficulty problem solving and needing cues to slow movements to perform correctly. pt also reports she did not recognize her room and felt we were in the wrong place. max cues to attend to positioning of feet and hands for safety ?  ?  ? ?  ?Exercises General Exercises - Lower Extremity ?Ankle Circles/Pumps: AROM, Both, 20 reps, Seated ?Quad Sets: AROM, Left, 15 reps, Seated ?Toe Raises: AROM, Both, 20 reps, Seated ?Heel Raises: AROM, Both, 20 reps, Seated ?Other Exercises ?Other Exercises: sit-stan x5 with LLE bias and RLE extended. minA ? ?  ?General Comments General comments (skin integrity, edema, etc.): VSS on RA ?  ?  ? ?Pertinent Vitals/Pain Pain Assessment ?Pain Assessment: No/denies pain ?Pain Intervention(s): Monitored during session  ? ? ? ?PT Goals (current goals can now be found in the care plan section) Acute Rehab PT Goals ?Patient Stated Goal: to improve ?PT Goal Formulation: With patient/family ?Time For Goal Achievement: 03/25/22 ?Potential to Achieve Goals: Good ?Progress towards PT goals: Progressing toward goals ? ?  ?Frequency ? ? ? Min 4X/week ? ? ? ?  ?PT Plan Discharge plan needs to be updated  ? ? ?   ?AM-PAC PT "6 Clicks" Mobility   ?Outcome Measure ? Help needed turning from your back to your side while in a flat bed without using  bedrails?: A Little ?Help needed moving from lying on your back to sitting on the side of a flat bed without using bedrails?: A Little ?Help needed moving to and from a bed to a chair (including a wheelchair)?: A Little ?Help needed standing up from a chair using your arms (e.g., wheelchair or bedside chair)?: A Little ?Help needed to walk in hospital room?: A Little ?Help needed climbing 3-5 steps with a railing? : A Little ?6 Click Score: 18 ? ?  ?End of Session Equipment Utilized During Treatment: Gait belt ?Activity Tolerance: Patient tolerated treatment well ?Patient left: in chair;with call bell/phone within reach;with chair alarm set ?Nurse Communication: Mobility status (L sided deficits, notified MD) ?PT Visit Diagnosis: Unsteadiness on feet (R26.81);Other abnormalities of gait and mobility (R26.89);Muscle weakness (generalized) (M62.81);Difficulty in walking, not elsewhere classified (R26.2);Dizziness and giddiness (R42) ?  ? ? ?Time: 3837-7939 ?PT Time Calculation (min) (ACUTE ONLY): 32 min ? ?Charges:  $Gait Training: 8-22 mins ?$Neuromuscular Re-education: 8-22 mins          ?          ? ?[place] Carbo, PT, DPT  ? ?Acute Rehabilitation Department ?Pager #: [phone] - [phone] ? ? ?Sandra Cockayne ?03/12/2022, 12:44 PM ? ?

## 2022-03-12 NOTE — Progress Notes (Signed)
Patient ID: Amanda Rich, female   DOB: 10/27/1966, 56 y.o.   MRN: [id number] ?Vital signs stable, ?Left sided weakness mild in lower extremity, seems to be persisting. ?PT OT feel patient may require CIR as she lives alone. ?Will transfer to floor, discontinue cardiac monitor. ?

## 2022-03-13 ENCOUNTER — Encounter (HOSPITAL_COMMUNITY): Payer: Self-pay | Admitting: Neurosurgery

## 2022-03-13 NOTE — Progress Notes (Signed)
Occupational Therapy Treatment ?Patient Details ?Name: Amanda Rich ?MRN: [id number] ?DOB: 24-May-1966 ?Today's Date: 03/13/2022 ? ? ?History of present illness 56 yo F presented 03/10/22 for s/p Pipeline embolization of RICA aneurysm.  PMH includes asthma, anemia, depression, breast cancer s/p chemo, pericarditis ?  ?OT comments ? Pt participated in OT ADL retraining session with focus on functional mobility, transfers during simulated toileting and grooming while standing at sink. Pt with cog deficits and L UE/LE weakness impacting independence for ADL's and functional mobility related to ADL's. She is planning to go to in patient rehab prior to d/c home as pt son works third shift and sleeps some during the day.  ? ?Recommendations for follow up therapy are one component of a multi-disciplinary discharge planning process, led by the attending physician.  Recommendations may be updated based on patient status, additional functional criteria and insurance authorization. ?   ?Follow Up Recommendations ? Acute inpatient rehab (3hours/day)  ?  ?Assistance Recommended at Discharge Intermittent Supervision/Assistance  ?Patient can return home with the following ? Assistance with cooking/housework;Assist for transportation;A little help with bathing/dressing/bathroom ?  ?Equipment Recommendations ? Tub/shower seat  ?  ?Recommendations for Other Services   ? ?  ?Precautions / Restrictions Precautions ?Precautions: Fall ?Restrictions ?Weight Bearing Restrictions: No  ? ? ?  ? ?Mobility Bed Mobility ?Overal bed mobility:  (Pt received sitting up in chair) ?  ?  ?Patient Response: Cooperative ? ?Transfers ?Overall transfer level: Needs assistance ?Equipment used: Rolling walker (2 wheels) ?Transfers: Sit to/from Stand ?Sit to Stand: Min guard ?  ?  ?Step pivot transfers: Min guard ?  ?  ?General transfer comment: Min guard for sit-stand transfers with vc's for hand placement. Pt ambulates with narrow BOS and benefits from  vc's for positioning of L UE/LE during functional mobility/transfers. ?  ?  ?Balance Overall balance assessment: Needs assistance ?Sitting-balance support: No upper extremity supported, Feet supported ?Sitting balance-Leahy Scale: Good ?  ?  ?Standing balance support: Bilateral upper extremity supported, During functional activity, Reliant on assistive device for balance ?Standing balance-Leahy Scale: Poor ?Standing balance comment: Reliant on RW for support ?  ?   ? ?ADL either performed or assessed with clinical judgement  ? ?ADL Overall ADL's : Needs assistance/impaired ?  ?  ?Grooming: Wash/dry hands;Min guard;Standing;Wash/dry face;Oral care ?Grooming Details (indicate cue type and reason): VC's for positioning when standing at sink and to increase base of stance (Pt maintains narrow BOS). ?  ?  ?Toilet Transfer: Min guard;Minimal assistance;Ambulation;Rolling walker (2 wheels);Cueing for safety;Cueing for sequencing ?Toilet Transfer Details (indicate cue type and reason): VC's for safety and sequencing as well as for positioning of LLE during transfer ?  ?  ?Functional mobility during ADLs: Min guard;Minimal assistance;Cueing for safety;Cueing for sequencing;Rolling walker (2 wheels) ?General ADL Comments: Pt participated in OT ADL retraining session with focus on functional mobility, transfers during simulated toileting and grooming while standing at sink. Pt with cog deficits and L UE/LE weakness impacting independence for ADL's and functional mobility related to ADL's. She is planning to go to in patient rehab prior to d/c home as pt son works third shift and sleeps some during the day. ?  ? ?Extremity/Trunk Assessment Upper Extremity Assessment ?Upper Extremity Assessment: Generalized weakness;LUE deficits/detail ?LUE:  (Generalized weakness and reports of numbness in palm after ambulating using RW. "I don't know if that's from my neuropathy from breast cancer/chemo or if this is something differnet") ?LUE  Sensation: decreased light touch;history of peripheral neuropathy (paresthesias  in L hand/volar palm) ?LUE Coordination:  (NT) ?  ?Lower Extremity Assessment ?Lower Extremity Assessment: Defer to PT evaluation ?  ?Cervical / Trunk Assessment ?Cervical / Trunk Assessment: Normal ?  ? ?Vision Baseline Vision/History: 1 Wears glasses ?Patient Visual Report: No change from baseline ?  ?  ?   ?   ? ?Cognition Arousal/Alertness: Awake/alert ?Behavior During Therapy: [facility] for tasks assessed/performed ?Overall Cognitive Status: Impaired/Different from baseline ?Area of Impairment: Safety/judgement, Problem solving, Attention, Memory, Following commands, Awareness ?  ?Current Attention Level: Sustained ?Memory: Decreased short-term memory ?Following Commands: Follows one step commands consistently, Follows one step commands with increased time, Follows multi-step commands inconsistently ?Safety/Judgement: Decreased awareness of deficits ?Awareness: Intellectual ?Problem Solving: Slow processing, Difficulty sequencing, Requires verbal cues ?  ?  ?  ?   ?   ?   ?General Comments VSS on RA  ? ? ?Pertinent Vitals/ Pain       Pain Assessment ?Pain Assessment: 0-10 ?Pain Score: 3  ?Pain Location: Achy head, behind right eye and ear per pt report ?Pain Descriptors / Indicators: Aching ?Pain Intervention(s): Limited activity within patient's tolerance, Monitored during session, Repositioned ? ?Home Living  Refer to initial acute OT eval ?  ?  ?Prior Functioning/Environment    ? Mod I ADL's and functional mobility/transfers  ? ?Frequency ? Min 2X/week  ? ? ? ? ?  ?Progress Toward Goals ? ?OT Goals(current goals can now be found in the care plan section) ? Progress towards OT goals: Progressing toward goals ? ?Acute Rehab OT Goals ?Patient Stated Goal: Get stronger, go to rehab then home ?OT Goal Formulation: With patient ?Time For Goal Achievement: 03/24/22 ?Potential to Achieve Goals: Good  ?Plan Discharge plan needs to be updated  (Recommend in pt rehab)   ? ?AM-PAC OT "6 Clicks" Daily Activity     ?Outcome Measure ? ? Help from another person eating meals?: None ?Help from another person taking care of personal grooming?: A Little ?Help from another person toileting, which includes using toliet, bedpan, or urinal?: A Little ?Help from another person bathing (including washing, rinsing, drying)?: A Little ?Help from another person to put on and taking off regular upper body clothing?: A Little ?Help from another person to put on and taking off regular lower body clothing?: A Little ?6 Click Score: 19 ? ?  ?End of Session Equipment Utilized During Treatment: Gait belt;Rolling walker (2 wheels) ? ?OT Visit Diagnosis: Unsteadiness on feet (R26.81);Muscle weakness (generalized) (M62.81) ?  ?Activity Tolerance Patient tolerated treatment well ?  ?Patient Left in chair;with call bell/phone within reach;with family/visitor present ?  ?Nurse Communication   ?  ? ?   ? ?Time: 1040-1059 ?OT Time Calculation (min): 19 min ? ?Charges: OT General Charges ?$OT Visit: 1 Visit ?OT Treatments ?$Self Care/Home Management : 8-22 mins ? ? ?Percell Miller Beth Dixon, OTR/L ?03/13/2022, 11:57 AM ? ? ?

## 2022-03-13 NOTE — Progress Notes (Signed)
Physical Therapy Treatment ?Patient Details ?Name: Amanda Rich ?MRN: [id number] ?DOB: 07/18/1966 ?Today's Date: 03/13/2022 ? ? ?History of Present Illness 56 yo F presented 03/10/22 for s/p Pipeline embolization of RICA aneurysm.  PMH includes asthma, anemia, depression, breast cancer s/p chemo, pericarditis ? ?  ?PT Comments  ? ? Pt making steady progress with mobility. Expect to continue with this progress and return to independence.    ?Recommendations for follow up therapy are one component of a multi-disciplinary discharge planning process, led by the attending physician.  Recommendations may be updated based on patient status, additional functional criteria and insurance authorization. ? ?Follow Up Recommendations ? Acute inpatient rehab (3hours/day) ?  ?  ?Assistance Recommended at Discharge Frequent or constant Supervision/Assistance  ?Patient can return home with the following Help with stairs or ramp for entrance;Assist for transportation;Assistance with cooking/housework;A little help with walking and/or transfers;A little help with bathing/dressing/bathroom ?  ?Equipment Recommendations ? Rolling walker (2 wheels)  ?  ?Recommendations for Other Services   ? ? ?  ?Precautions / Restrictions Precautions ?Precautions: Fall ?Restrictions ?Weight Bearing Restrictions: No  ?  ? ?Mobility ? Bed Mobility ?Overal bed mobility: Needs Assistance ?Bed Mobility: Supine to Sit, Sit to Supine ?  ?  ?Supine to sit: Supervision ?Sit to supine: Supervision ?  ?General bed mobility comments: Incr time and effort ?  ? ?Transfers ?Overall transfer level: Needs assistance ?Equipment used: Rolling walker (2 wheels), 1 person hand held assist ?Transfers: Sit to/from Stand ?Sit to Stand: Min guard, Min assist ?  ?  ?  ?  ?  ?General transfer comment: min guard assist for safety and verbal cues for hand placement when standing with walker. Min assist for balance when standing with hand held ?   ? ?Ambulation/Gait ?Ambulation/Gait assistance: Min guard ?Gait Distance (Feet): 125 Feet ?Assistive device: Rolling walker (2 wheels) ?Gait Pattern/deviations: Step-through pattern, Decreased stance time - left, Decreased stride length, Decreased dorsiflexion - left, Narrow base of support, Step-to pattern, Decreased step length - left, Shuffle, Knee flexed in stance - left ?Gait velocity: decr ?Gait velocity interpretation: <1.31 ft/sec, indicative of household ambulator ?  ?General Gait Details: Pt with slow, deliberate gait with verbal cues for gait sequence. Verbal cues to increase lt step length ? ? ?Stairs ?  ?  ?  ?  ?  ? ? ?Wheelchair Mobility ?  ? ?Modified Rankin (Stroke Patients Only) ?Modified Rankin (Stroke Patients Only) ?Pre-Morbid Rankin Score: No significant disability ?Modified Rankin: Moderately severe disability ? ? ?  ?Balance Overall balance assessment: Needs assistance ?Sitting-balance support: No upper extremity supported, Feet supported ?Sitting balance-Leahy Scale: Good ?  ?  ?Standing balance support: Bilateral upper extremity supported, During functional activity, Reliant on assistive device for balance ?Standing balance-Leahy Scale: Poor ?Standing balance comment: UE support and min guard for static standing ?  ?  ?  ?  ?  ?  ?  ?  ?  ?  ?  ?  ? ?  ?Cognition Arousal/Alertness: Awake/alert ?Behavior During Therapy: [facility] for tasks assessed/performed ?Overall Cognitive Status: Impaired/Different from baseline ?Area of Impairment: Memory, Problem solving ?  ?  ?  ?  ?  ?  ?  ?  ?  ?  ?Memory: Decreased short-term memory ?  ?  ?  ?Problem Solving: Slow processing, Difficulty sequencing, Requires verbal cues ?  ?  ?  ? ?  ?Exercises   ? ?  ?General Comments   ?  ?  ? ?Pertinent  Vitals/Pain Pain Assessment ?Pain Assessment: No/denies pain  ? ? ?Home Living   ?  ?  ?  ?  ?  ?  ?  ?  ?  ?   ?  ?Prior Function    ?  ?  ?   ? ?PT Goals (current goals can now be found in the care plan section)  Acute Rehab PT Goals ?Patient Stated Goal: to improve ?Progress towards PT goals: Progressing toward goals ? ?  ?Frequency ? ? ? Min 4X/week ? ? ? ?  ?PT Plan Discharge plan needs to be updated  ? ? ?Co-evaluation   ?  ?  ?  ?  ? ?  ?AM-PAC PT "6 Clicks" Mobility   ?Outcome Measure ? Help needed turning from your back to your side while in a flat bed without using bedrails?: A Little ?Help needed moving from lying on your back to sitting on the side of a flat bed without using bedrails?: A Little ?Help needed moving to and from a bed to a chair (including a wheelchair)?: A Little ?Help needed standing up from a chair using your arms (e.g., wheelchair or bedside chair)?: A Little ?Help needed to walk in hospital room?: A Little ?Help needed climbing 3-5 steps with a railing? : A Little ?6 Click Score: 18 ? ?  ?End of Session Equipment Utilized During Treatment: Gait belt ?Activity Tolerance: Patient tolerated treatment well ?Patient left: with call bell/phone within reach;in bed;with bed alarm set ?  ?PT Visit Diagnosis: Unsteadiness on feet (R26.81);Other abnormalities of gait and mobility (R26.89);Muscle weakness (generalized) (M62.81);Difficulty in walking, not elsewhere classified (R26.2);Dizziness and giddiness (R42) ?  ? ? ?Time: 6063-0160 ?PT Time Calculation (min) (ACUTE ONLY): 20 min ? ?Charges:  $Gait Training: 8-22 mins          ?          ? ?[facility] PT ?Acute Rehabilitation Services ?Pager [phone] ?Office [phone] ? ? ? ?Shary Decamp [facility] ?03/13/2022, 6:17 PM ? ?

## 2022-03-13 NOTE — Progress Notes (Signed)
?  NEUROSURGERY PROGRESS NOTE  ? ?No issues overnight. Pt cont to report mild subjective incoordination with LUE but improved today. Ambulating well with PT. ? ?EXAM:  ?BP 106/75 (BP Location: Left Arm)   Pulse 78   Temp 98.2 ?F (36.8 ?C)   Resp 18   Ht '5\' 5"'$  (1.651 m)   Wt 56.2 kg   LMP 10/15/2017 (Approximate)   SpO2 100%   BMI 20.63 kg/m?  ? ?Awake, alert, oriented  ?Speech fluent, appropriate  ?CN grossly intact  ?5/5 BUE/BLE  ? ?IMPRESSION:  ?56 y.o. female s/p Pipeline embolization of RICA aneurysm with mild subjective left-sided weakness. ? ?PLAN: ?- Cont supportive care ?- pending CIR approval ? ? ?Consuella Lose, MD ?[facility]  ? ?

## 2022-03-13 NOTE — Progress Notes (Addendum)
Inpatient Rehabilitation Admissions Coordinator  ? ?I met at bedside with patient . We discussed goals and expectations of a possible Cir admit. She lives alone with fiance and family prn only, therefore needs to be Mod I to return home. She prefers Cir admit. I will begin Auth with [facility]  medicaid for a possible Cir admit. ? ?Danne Baxter, RN, MSN ?Rehab Admissions Coordinator ?[phone] ?03/13/2022 12:18 PM ? ?

## 2022-03-13 NOTE — PMR Pre-admission (Signed)
PMR Admission Coordinator Pre-Admission Assessment ? ?Patient: Amanda Rich is an 56 y.o., female ?MRN: [id number] ?DOB: 1966/07/26 ?Height: '5\' 5"'$  (165.1 cm) ?Weight: 56.2 kg ? ?Insurance Information ?HMO:     PPO:      PCP:      IPA:      80/20:      OTHER:  ?[place]      Policy#: [id number]      Subscriber: pt ?CM Name: [place]      Phone#: [phone]     Fax#: [phone] ?Pre-Cert#: [id number]  approved  with updates due 3/27    Employer:  ?Benefits:  Phone #: [phone]     Name: 3/20 ?Eff. Date: 11/24/21 to 07/24/2022     Deduct: none      Out of Pocket Max: none      Life Max: none ?CIR: Per Medicaid Guidelines   SNF:  ?Outpatient:      Co-Pay:  ?Home Health:       Co-Pay:  ?DME:      Co-Pay:  ?Providers: in network ? ?SECONDARY: none      ? ?Financial Counselor:       Phone#:  ? ?The ?Data Collection Information Summary? for patients in Inpatient Rehabilitation Facilities with attached ?Privacy Act [place] Records? was provided and verbally reviewed with: N/A ? ?Emergency Contact Information ?Contact Information   ? ? Name Relation Home Work Mobile  ? Robinson,Lamont Significant other [phone]  [phone]  ? Carter,Wendy Sister [phone]  [phone]  ? Dortha Kern Daughter   [phone]  ? ?  ? ?Current Medical History  ?Patient Admitting Diagnosis: Aneursym repair ? ?History of Present Illness: 56 year old right-handed female with history of right breast cancer about 1-1/2 years ago undergoing neoadjuvant chemotherapy which included Taxol.  Patient subsequently underwent lumpectomy with right side axillary lymph node dissection.  She had negative margins on the lumpectomy and nodes were negative for carcinoma.  She did undergo subsequent adjuvant radiation treatments and currently maintained on Arimidex therapy followed by Dr. Gery Pray as well as Dr. Mickeal Skinner.  Unfortunately likely as result of the Taxol therapy she complained of peripheral  neuropathy including muscle spasms which involve primarily the right leg.  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  Per chart review patient lives alone and fianc? checks on her as needed..  1 level home 3 steps to entry.  Independent and driving prior to admission.  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 Kathyrn Sheriff.  Patient has been placed on Brilinta and remains on low-dose aspirin.  She had been pretreated for contrast allergy with prednisone as well as Benadryl.  Tolerating a regular consistency diet.   ? ?Patient's medical record from [facility] has been reviewed by the rehabilitation admission coordinator and physician. ? ?Past Medical History  ?Past Medical History:  ?Diagnosis Date  ? Anemia   ? Asthma   ? Cancer [facility])   ? breast cancer  ? Depression   ? Dyspnea   ? Family history of breast cancer   ? Family history of liver cancer   ? History of radiation therapy 03/10/21-04/11/21  ? Right breast- Dr. Gery Pray  ? Pericarditis   ?  Smoker   ? Umbilical hernia   ? ?Has the patient had major surgery during 100 days prior to admission? Yes ? ?Family History   ?family history includes Autism in her half-brother; Breast cancer in her cousin and another family member; Breast cancer (age of onset: 51) in her maternal aunt; Breast cancer (age of onset: 65) in an other family member; Colon cancer in her cousin; Colon cancer (age of onset: 60) in her maternal uncle; Diabetes in her maternal aunt, mother, and paternal uncle; Hypertension in her mother; Liver cancer (age of onset: 6) in her daughter; Lung cancer in an other family member. ? ?Current Medications ? ?Current  Facility-Administered Medications:  ?  0.9 %  sodium chloride infusion, , Intravenous, Continuous, Kristeen Miss, MD, Stopped at 03/11/22 0407 ?  albuterol (PROVENTIL) (2.5 MG/3ML) 0.083% nebulizer solution 3 mL, 3 mL, Inhalation, Q6H PRN, Kristeen Miss, MD ?  anastrozole (ARIMIDEX) tablet 1 mg, 1 mg, Oral, Daily, Elsner, Henry, MD, 1 mg at 03/13/22 0813 ?  aspirin chewable tablet 81 mg, 81 mg, Oral, Daily, Kristeen Miss, MD, 81 mg at 03/13/22 0813 ?  baclofen (LIORESAL) tablet 10 mg, 10 mg, Oral, TID PRN, Kristeen Miss, MD ?  Chlorhexidine Gluconate Cloth 2 % PADS 6 each, 6 each, Topical, Q0600, Kristeen Miss, MD, 6 each at 03/13/22 [phone]  citalopram (CELEXA) tablet 10 mg, 10 mg, Oral, QHS, Kristeen Miss, MD, 10 mg at 03/13/22 2241 ?  gabapentin (NEURONTIN) capsule 300 mg, 300 mg, Oral, BID, Kristeen Miss, MD, 300 mg at 03/13/22 2241 ?  HYDROcodone-acetaminophen (NORCO/VICODIN) 5-325 MG per tablet 1 tablet, 1 tablet, Oral, Q4H PRN, Kristeen Miss, MD ?  labetalol (NORMODYNE) injection 10-40 mg, 10-40 mg, Intravenous, Q10 min PRN, Kristeen Miss, MD ?  mometasone-formoterol (DULERA) 100-5 MCG/ACT inhaler 2 puff, 2 puff, Inhalation, BID, Kristeen Miss, MD, 2 puff at 03/14/22 0809 ?  morphine (PF) 2 MG/ML injection 1-2 mg, 1-2 mg, Intravenous, Q2H PRN, Kristeen Miss, MD ?  ondansetron (ZOFRAN) tablet 4 mg, 4 mg, Oral, Q4H PRN **OR** ondansetron (ZOFRAN) injection 4 mg, 4 mg, Intravenous, Q4H PRN, Kristeen Miss, MD ?  pantoprazole (PROTONIX) EC tablet 40 mg, 40 mg, Oral, QHS, Kristeen Miss, MD, 40 mg at 03/13/22 2241 ?  predniSONE (DELTASONE) tablet 40 mg, 40 mg, Oral, Q breakfast, Kristeen Miss, MD, 40 mg at 03/13/22 2119 ?  ticagrelor (BRILINTA) tablet 90 mg, 90 mg, Oral, BID, Kristeen Miss, MD, 90 mg at 03/13/22 2241 ?  umeclidinium bromide (INCRUSE ELLIPTA) 62.5 MCG/ACT 1 puff, 1 puff, Inhalation, Daily, Kristeen Miss, MD, 1 puff at 03/14/22 0810 ? ?Patients Current Diet:  ?Diet Order   ? ?       ?  Diet regular  Room service appropriate? Yes with Assist; Fluid consistency: Thin  Diet effective now       ?  ? ?  ?  ? ?  ? ?Precautions / Restrictions ?Precautions ?Precautions: Fall ?Restrictions ?Weight Bearing Restrictions: No  ? ?Has the patient had 2 or more falls or a fall with injury in the past year? No ? ?Prior Activity Level ?Community (5-7x/wk): Independent and driving ? ?Prior Functional Level ?Self Care: Did the patient need help bathing, dressing, using the toilet or eating? Independent ? ?Indoor Mobility: Did the patient need assistance with walking from room to room (with or without device)? Independent ? ?Stairs: Did the patient need assistance with internal or external stairs (with or without device)? Independent ? ?Functional Cognition: Did the patient need  help planning regular tasks such as shopping or remembering to take medications? Independent ? ?Patient Information ?Are you of Hispanic, Latino/a,or Spanish origin?: A. No, not of Hispanic, Latino/a, or Spanish origin ?What is your race?: B. Black or African American ?Do you need or want an interpreter to communicate with a doctor or health care staff?: 0. No ? ?Patient's Response To:  ?Health Literacy and Transportation ?Is the patient able to respond to health literacy and transportation needs?: Yes ?Health Literacy - How often do you need to have someone help you when you read instructions, pamphlets, or other written material from your doctor or pharmacy?: Never ?In the past 12 months, has lack of transportation kept you from medical appointments or from getting medications?: No ?In the past 12 months, has lack of transportation kept you from meetings, work, or from getting things needed for daily living?: No ? ?Home Assistive Devices / Equipment ?Home Assistive Devices/Equipment: None ?Home Equipment: None ? ?Prior Device Use: Indicate devices/aids used by the patient prior to current illness, exacerbation or injury? None of the above ? ?Current  Functional Level ?Cognition ? Overall Cognitive Status: Impaired/Different from baseline ?Current Attention Level: Sustained ?Orientation Level: Oriented X4 ?Following Commands: Follows one step commands consistentl

## 2022-03-14 ENCOUNTER — Inpatient Hospital Stay (HOSPITAL_COMMUNITY)
Admission: RE | Admit: 2022-03-14 | Discharge: 2022-03-24 | DRG: 057 | Disposition: A | Discharge status: Home / Self Care | Payer: Medicaid Other | Source: Intra-hospital | Attending: Physical Medicine and Rehabilitation | Admitting: Physical Medicine and Rehabilitation

## 2022-03-14 ENCOUNTER — Encounter (HOSPITAL_COMMUNITY): Payer: Self-pay

## 2022-03-14 ENCOUNTER — Other Ambulatory Visit: Payer: Self-pay

## 2022-03-14 DIAGNOSIS — I69398 Other sequelae of cerebral infarction: Principal | ICD-10-CM

## 2022-03-14 DIAGNOSIS — J302 Other seasonal allergic rhinitis: Secondary | ICD-10-CM | POA: Diagnosis not present

## 2022-03-14 DIAGNOSIS — Z803 Family history of malignant neoplasm of breast: Secondary | ICD-10-CM | POA: Diagnosis not present

## 2022-03-14 DIAGNOSIS — Z170 Estrogen receptor positive status [ER+]: Secondary | ICD-10-CM

## 2022-03-14 DIAGNOSIS — R739 Hyperglycemia, unspecified: Secondary | ICD-10-CM | POA: Diagnosis present

## 2022-03-14 DIAGNOSIS — Z91041 Radiographic dye allergy status: Secondary | ICD-10-CM

## 2022-03-14 DIAGNOSIS — F32A Depression, unspecified: Secondary | ICD-10-CM | POA: Diagnosis not present

## 2022-03-14 DIAGNOSIS — Z853 Personal history of malignant neoplasm of breast: Secondary | ICD-10-CM

## 2022-03-14 DIAGNOSIS — I671 Cerebral aneurysm, nonruptured: Secondary | ICD-10-CM | POA: Diagnosis present

## 2022-03-14 DIAGNOSIS — Z7982 Long term (current) use of aspirin: Secondary | ICD-10-CM

## 2022-03-14 DIAGNOSIS — G620 Drug-induced polyneuropathy: Secondary | ICD-10-CM

## 2022-03-14 DIAGNOSIS — Z833 Family history of diabetes mellitus: Secondary | ICD-10-CM | POA: Diagnosis not present

## 2022-03-14 DIAGNOSIS — Z79811 Long term (current) use of aromatase inhibitors: Secondary | ICD-10-CM | POA: Diagnosis not present

## 2022-03-14 DIAGNOSIS — J432 Centrilobular emphysema: Secondary | ICD-10-CM

## 2022-03-14 DIAGNOSIS — G629 Polyneuropathy, unspecified: Secondary | ICD-10-CM | POA: Diagnosis present

## 2022-03-14 DIAGNOSIS — Z801 Family history of malignant neoplasm of trachea, bronchus and lung: Secondary | ICD-10-CM

## 2022-03-14 DIAGNOSIS — Z8249 Family history of ischemic heart disease and other diseases of the circulatory system: Secondary | ICD-10-CM | POA: Diagnosis not present

## 2022-03-14 DIAGNOSIS — F17210 Nicotine dependence, cigarettes, uncomplicated: Secondary | ICD-10-CM | POA: Diagnosis not present

## 2022-03-14 DIAGNOSIS — M792 Neuralgia and neuritis, unspecified: Secondary | ICD-10-CM

## 2022-03-14 DIAGNOSIS — E8809 Other disorders of plasma-protein metabolism, not elsewhere classified: Secondary | ICD-10-CM | POA: Diagnosis not present

## 2022-03-14 DIAGNOSIS — Z800 Family history of malignant neoplasm of digestive organs: Secondary | ICD-10-CM | POA: Diagnosis not present

## 2022-03-14 DIAGNOSIS — J439 Emphysema, unspecified: Secondary | ICD-10-CM

## 2022-03-14 DIAGNOSIS — C50211 Malignant neoplasm of upper-inner quadrant of right female breast: Secondary | ICD-10-CM

## 2022-03-14 DIAGNOSIS — R3915 Urgency of urination: Secondary | ICD-10-CM | POA: Diagnosis not present

## 2022-03-14 DIAGNOSIS — M21372 Foot drop, left foot: Secondary | ICD-10-CM | POA: Diagnosis not present

## 2022-03-14 DIAGNOSIS — F17200 Nicotine dependence, unspecified, uncomplicated: Secondary | ICD-10-CM

## 2022-03-14 DIAGNOSIS — Z923 Personal history of irradiation: Secondary | ICD-10-CM | POA: Diagnosis not present

## 2022-03-14 DIAGNOSIS — Z79899 Other long term (current) drug therapy: Secondary | ICD-10-CM | POA: Diagnosis not present

## 2022-03-14 DIAGNOSIS — I639 Cerebral infarction, unspecified: Secondary | ICD-10-CM | POA: Diagnosis not present

## 2022-03-14 DIAGNOSIS — R300 Dysuria: Secondary | ICD-10-CM | POA: Diagnosis not present

## 2022-03-14 DIAGNOSIS — Z7951 Long term (current) use of inhaled steroids: Secondary | ICD-10-CM | POA: Diagnosis not present

## 2022-03-14 DIAGNOSIS — R350 Frequency of micturition: Secondary | ICD-10-CM | POA: Diagnosis not present

## 2022-03-14 DIAGNOSIS — R5381 Other malaise: Secondary | ICD-10-CM | POA: Diagnosis not present

## 2022-03-14 DIAGNOSIS — J45909 Unspecified asthma, uncomplicated: Secondary | ICD-10-CM | POA: Diagnosis not present

## 2022-03-14 DIAGNOSIS — F329 Major depressive disorder, single episode, unspecified: Secondary | ICD-10-CM | POA: Diagnosis not present

## 2022-03-14 DIAGNOSIS — T451X5A Adverse effect of antineoplastic and immunosuppressive drugs, initial encounter: Secondary | ICD-10-CM

## 2022-03-14 DIAGNOSIS — R0609 Other forms of dyspnea: Secondary | ICD-10-CM

## 2022-03-14 HISTORY — PX: IR NEURO EACH ADD'L AFTER BASIC UNI RIGHT (MS): IMG5374

## 2022-03-14 HISTORY — PX: IR ANGIOGRAM FOLLOW UP STUDY: IMG697

## 2022-03-14 MED ORDER — GABAPENTIN 300 MG PO CAPS
300.0000 mg | ORAL_CAPSULE | Freq: Two times a day (BID) | ORAL | Status: DC
  Administered 2022-03-14 – 2022-03-24 (×20): 300 mg via ORAL

## 2022-03-14 MED ORDER — BACLOFEN 10 MG PO TABS
10.0000 mg | ORAL_TABLET | Freq: Three times a day (TID) | ORAL | Status: DC | PRN
  Administered 2022-03-17 – 2022-03-20 (×2): 10 mg via ORAL

## 2022-03-14 MED ORDER — ONDANSETRON HCL 4 MG PO TABS: 4.0000 mg | ORAL_TABLET | ORAL | Status: DC | PRN

## 2022-03-14 MED ORDER — ANASTROZOLE 1 MG PO TABS
1.0000 mg | ORAL_TABLET | Freq: Every day | ORAL | Status: DC
  Administered 2022-03-15 – 2022-03-24 (×10): 1 mg via ORAL
  Filled 2022-03-14 (×10): qty 1

## 2022-03-14 MED ORDER — PANTOPRAZOLE SODIUM 40 MG PO TBEC
40.0000 mg | DELAYED_RELEASE_TABLET | Freq: Every day | ORAL | Status: DC
  Administered 2022-03-14 – 2022-03-23 (×10): 40 mg via ORAL

## 2022-03-14 MED ORDER — UMECLIDINIUM BROMIDE 62.5 MCG/ACT IN AEPB
1.0000 | INHALATION_SPRAY | Freq: Every day | RESPIRATORY_TRACT | Status: DC
  Administered 2022-03-15 – 2022-03-24 (×10): 1 via RESPIRATORY_TRACT
  Filled 2022-03-14 (×2): qty 7

## 2022-03-14 MED ORDER — ALBUTEROL SULFATE (2.5 MG/3ML) 0.083% IN NEBU
3.0000 mL | INHALATION_SOLUTION | Freq: Four times a day (QID) | RESPIRATORY_TRACT | Status: DC | PRN
Start: 2022-03-14 — End: 2022-03-24

## 2022-03-14 MED ORDER — CITALOPRAM HYDROBROMIDE 10 MG PO TABS
10.0000 mg | ORAL_TABLET | Freq: Every day | ORAL | Status: DC
  Administered 2022-03-14 – 2022-03-23 (×10): 10 mg via ORAL

## 2022-03-14 MED ORDER — HYDROCODONE-ACETAMINOPHEN 5-325 MG PO TABS: 1.0000 | ORAL_TABLET | ORAL | Status: DC | PRN

## 2022-03-14 MED ORDER — ONDANSETRON HCL 4 MG/2ML IJ SOLN: 4.0000 mg | INTRAMUSCULAR | Status: DC | PRN

## 2022-03-14 MED ORDER — MOMETASONE FURO-FORMOTEROL FUM 100-5 MCG/ACT IN AERO
2.0000 | INHALATION_SPRAY | Freq: Two times a day (BID) | RESPIRATORY_TRACT | Status: DC
  Administered 2022-03-14 – 2022-03-24 (×20): 2 via RESPIRATORY_TRACT
  Filled 2022-03-14: qty 8.8

## 2022-03-14 MED ORDER — TICAGRELOR 90 MG PO TABS
90.0000 mg | ORAL_TABLET | Freq: Two times a day (BID) | ORAL | Status: DC
  Administered 2022-03-14 – 2022-03-24 (×20): 90 mg via ORAL

## 2022-03-14 MED ORDER — PREDNISONE 20 MG PO TABS
40.0000 mg | ORAL_TABLET | Freq: Every day | ORAL | Status: DC
  Administered 2022-03-15 – 2022-03-22 (×8): 40 mg via ORAL

## 2022-03-14 MED ORDER — TICAGRELOR 90 MG PO TABS: 90.0000 mg | ORAL_TABLET | Freq: Two times a day (BID) | ORAL | Status: DC

## 2022-03-14 MED ORDER — ASPIRIN 81 MG PO CHEW: 81.0000 mg | CHEWABLE_TABLET | Freq: Every day | ORAL | Status: DC

## 2022-03-14 MED ORDER — ASPIRIN 81 MG PO CHEW
81.0000 mg | CHEWABLE_TABLET | Freq: Every day | ORAL | Status: DC
  Administered 2022-03-15 – 2022-03-24 (×10): 81 mg via ORAL

## 2022-03-14 MED ORDER — ACETAMINOPHEN 325 MG PO TABS
650.0000 mg | ORAL_TABLET | ORAL | Status: DC | PRN
  Administered 2022-03-14 – 2022-03-16 (×3): 650 mg via ORAL

## 2022-03-14 NOTE — H&P (Signed)
Physical Medicine and Rehabilitation Admission H&P     HPI: Amanda Rich is a 56 year old right-handed female with history of right breast cancer about 1-1/2 years ago undergoing neoadjuvant chemotherapy which included Taxol.  Patient subsequently underwent lumpectomy with right side axillary lymph node dissection.  She had negative margins on the lumpectomy and nodes were negative for carcinoma.  She did undergo subsequent adjuvant radiation treatments and currently maintained on Arimidex therapy followed by Dr. Antony Blackbird as well as Dr. Barbaraann Cao.  Unfortunately likely as result of the Taxol therapy she complained of peripheral neuropathy including muscle spasms which involve primarily the right leg.  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  Per chart review patient lives alone and fianc checks on her as needed..  1 level home 3 steps to entry.  Independent and driving prior to admission.  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 Conchita Paris.  Patient has been placed on Brilinta and remains on low-dose aspirin.  She had been pretreated for contrast allergy with prednisone as well as Benadryl.  Tolerating a regular consistency diet.  Therapy evaluations completed noted incoordination with left upper extremity.  Recommendations were for inpatient rehab services.  Review of Systems  Constitutional:  Negative for chills and fever.  HENT:  Negative for hearing loss.   Eyes:  Negative for double vision.  Respiratory:  Positive for shortness of breath. Negative for cough.        Shortness of breath on exertion  Cardiovascular:  Negative for chest pain, palpitations and leg  swelling.  Gastrointestinal:  Positive for constipation. Negative for heartburn, nausea and vomiting.  Genitourinary:  Negative for dysuria, flank pain and hematuria.  Musculoskeletal:  Positive for myalgias.  Skin:  Negative for rash.  Psychiatric/Behavioral:  Positive for depression.   All other systems reviewed and are negative. Past Medical History:  Diagnosis Date   Anemia    Asthma    Cancer (HCC)    breast cancer   Depression    Dyspnea    Family history of breast cancer    Family history of liver cancer    History of radiation therapy 03/10/21-04/11/21   Right breast- Dr. Antony Blackbird   Pericarditis    Smoker    Umbilical hernia    Past Surgical History:  Procedure Laterality Date   ABLATION ON ENDOMETRIOSIS     BREAST BIOPSY Right    BREAST LUMPECTOMY WITH RADIOACTIVE SEED AND SENTINEL LYMPH NODE BIOPSY Right 02/02/2021   Procedure: RIGHT BREAST LUMPECTOMY WITH RADIOACTIVE SEED AND RIGHT SENTINEL LYMPH NODE MAPPING;  Surgeon: Harriette Bouillon, MD;  Location: [facility];  Service: General;  Laterality: Right;   GANGLION CYST EXCISION  12/25/2005   L wrist; [facility], [place]   IR ANGIO INTRA EXTRACRAN SEL INTERNAL CAROTID BILAT MOD SED  02/06/2022   IR ANGIO VERTEBRAL SEL VERTEBRAL BILAT MOD SED  02/06/2022   IR IMAGING GUIDED PORT INSERTION  09/10/2020   IR TRANSCATH/EMBOLIZ  03/10/2022   RADIOLOGY WITH ANESTHESIA N/A 03/10/2022   Procedure: Pipeline Embolization of Right Internal Carotid Artery Aneurysm;  Surgeon: Lisbeth Renshaw, MD;  Location: [facility] OR;  Service: Radiology;  Laterality: N/A;   TUBAL LIGATION  [facility]   UMBILICAL HERNIA REPAIR  12/26/2003   [facility]   Family History  Problem Relation Age of Onset   Diabetes Mother    Hypertension Mother    Liver cancer Daughter 72   Diabetes Maternal Aunt    Breast cancer Maternal Aunt 62   Colon cancer Maternal Uncle 57       or prostate cancer   Colon cancer Cousin    Breast cancer Cousin    Autism  Half-Brother    Breast cancer Other 29       maternal second cousin   Diabetes Paternal Uncle    Breast cancer Other        dx. 79s, paternal second cousin   Lung cancer Other        dx. 40s, paternal cousin once removed (father's cousin)   Social History:  reports that she has been smoking cigarettes. She has a 25.00 pack-year smoking history. She has never used smokeless tobacco. She reports that she does not currently use alcohol. She reports that she does not use drugs. Allergies:  Allergies  Allergen Reactions   Clarithromycin Swelling    Biaxin   Lyrica [Pregabalin]     Oversedated    Tape     Rash from Tegaderm   Ivp Dye [Iodinated Contrast Media] Rash   Medications Prior to Admission  Medication Sig Dispense Refill   albuterol (PROAIR HFA) 108 (90 Base) MCG/ACT inhaler Inhale 2 puffs into the lungs every 6 (six) hours as needed for wheezing or shortness of breath. 18 g 0   anastrozole (ARIMIDEX) 1 MG tablet Take 1 tablet (1 mg total) by mouth daily. 90 tablet 3   aspirin 81 MG chewable tablet Chew 1 tablet by mouth daily (start taking 7 days prior to scheduled procedure) 30 tablet 6   baclofen (LIORESAL) 10 MG tablet Take 1 tablet (10 mg total) by mouth 3 (three) times daily as needed for muscle spasms. 60 each 0   budesonide-formoterol (SYMBICORT) 80-4.5 MCG/ACT inhaler Inhale 2 puffs into the lungs in the morning and at bedtime. 10.2 g 12   Calcium-Vitamin D-Vitamin K (VIACTIV PO) Take 1 tablet by mouth daily.     Cholecalciferol (VITAMIN D) 50 MCG (2000 UT) tablet Take 2,000 Units by mouth daily.     citalopram (CELEXA) 10 MG tablet Take 1 tablet (10 mg total) by mouth daily. (Patient taking differently: Take 10 mg by mouth at bedtime.) 90 tablet 3   diphenhydrAMINE (BENADRYL) 25 mg capsule Take 2 capsules by mouth 1 hour prior to scheduled procedure 2 capsule 0   gabapentin (NEURONTIN) 300 MG capsule Take 300 mg by mouth 2 (two) times daily.     nicotine (NICODERM CQ -  DOSED IN MG/24 HR) 7 mg/24hr patch PLACE 1 PATCH ONTO THE SKIN DAILY. 84 patch 0   predniSONE (DELTASONE) 50 MG tablet Take 1 tablet by mouth 13, 7, and 1 hour prior to scheduled procedure 3 tablet 0   ticagrelor (BRILINTA) 90 MG TABS tablet Take 1 tablet (90 mg total) by mouth 2 (two) times daily. 60 tablet 6   Tiotropium Bromide Monohydrate (SPIRIVA RESPIMAT) 2.5 MCG/ACT AERS Inhale 2 puffs into the lungs daily. 4 g 6   cholecalciferol (VITAMIN D3) 25 MCG (1000 UNIT) tablet Take 2 tablets (2,000 Units total) by mouth daily. 90 tablet 0   pregabalin (LYRICA) 75 MG capsule Take 1 capsule by mouth 2  times daily. (Patient not taking: Reported on 03/01/2022) 60 capsule 3  Home: Home Living Family/patient expects to be discharged to:: Private residence Living Arrangements: Alone (fiance in and out from her home and to his 61 year old father's home) Available Help at Discharge: Family, Available PRN/intermittently Type of Home: House Home Access: Stairs to enter Secretary/administrator of Steps: 3 Entrance Stairs-Rails: None Home Layout: One level Bathroom Shower/Tub: Associate Professor: Yes Home Equipment: None  Lives With: Alone   Functional History: Prior Function Prior Level of Function : Independent/Modified Independent, Driving Mobility Comments: Does not use AD. ADLs Comments: Generally needing no assist for ADL/IADL.  Occasional assist with driving.  Functional Status:  Mobility: Bed Mobility Overal bed mobility: Needs Assistance Bed Mobility: Supine to Sit, Sit to Supine Rolling: Supervision Sidelying to sit: Min assist Supine to sit: Supervision Sit to supine: Supervision General bed mobility comments: Incr time and effort Transfers Overall transfer level: Needs assistance Equipment used: Rolling walker (2 wheels), 1 person hand held assist Transfers: Sit to/from Stand Sit to Stand: Min guard, Min assist Bed to/from  chair/wheelchair/BSC transfer type:: Step pivot Step pivot transfers: Min guard General transfer comment: min guard assist for safety and verbal cues for hand placement when standing with walker. Min assist for balance when standing with hand held Ambulation/Gait Ambulation/Gait assistance: Min guard Gait Distance (Feet): 125 Feet Assistive device: Rolling walker (2 wheels) Gait Pattern/deviations: Step-through pattern, Decreased stance time - left, Decreased stride length, Decreased dorsiflexion - left, Narrow base of support, Step-to pattern, Decreased step length - left, Shuffle, Knee flexed in stance - left General Gait Details: Pt with slow, deliberate gait with verbal cues for gait sequence. Verbal cues to increase lt step length Gait velocity: decr Gait velocity interpretation: <1.31 ft/sec, indicative of household ambulator    ADL: ADL Overall ADL's : Needs assistance/impaired Grooming: Wash/dry hands, Min guard, Standing, Wash/dry face, Oral care Grooming Details (indicate cue type and reason): VC's for positioning when standing at sink and to increase base of stance (Pt maintains narrow BOS). Lower Body Dressing: Min guard, Sit to/from stand Toilet Transfer: Min guard, Minimal assistance, Ambulation, Rolling walker (2 wheels), Cueing for safety, Cueing for sequencing Toilet Transfer Details (indicate cue type and reason): VC's for safety and sequencing as well as for positioning of LLE during transfer Functional mobility during ADLs: Min guard, Minimal assistance, Cueing for safety, Cueing for sequencing, Rolling walker (2 wheels) General ADL Comments: Pt participated in OT ADL retraining session with focus on functional mobility, transfers during simulated toileting and grooming while standing at sink. Pt with cog deficits and L UE/LE weakness impacting independence for ADL's and functional mobility related to ADL's. She is planning to go to in patient rehab prior to d/c home as pt  son works thrid shift and sleeps some during the day.  Cognition: Cognition Overall Cognitive Status: Impaired/Different from baseline Orientation Level: Oriented X4 Cognition Arousal/Alertness: Awake/alert Behavior During Therapy: WFL for tasks assessed/performed Overall Cognitive Status: Impaired/Different from baseline Area of Impairment: Memory, Problem solving Current Attention Level: Sustained Memory: Decreased short-term memory Following Commands: Follows one step commands consistently, Follows one step commands with increased time, Follows multi-step commands inconsistently Safety/Judgement: Decreased awareness of deficits Awareness: Intellectual Problem Solving: Slow processing, Difficulty sequencing, Requires verbal cues General Comments: pt with difficulty problem solving and needing cues to slow movements to perform correctly. pt also reports she did not recognize her room and felt we were in the wrong place. max cues to attend to positioning of feet and hands for  safety  Physical Exam: Blood pressure 122/76, pulse 61, temperature 98 F (36.7 C), temperature source Oral, resp. rate 16, height 5\' 5"  (1.651 m), weight 56.2 kg, last menstrual period 10/15/2017, SpO2 98 %. Physical Exam Constitutional:      General: She is not in acute distress.    Appearance: Normal appearance. She is not ill-appearing.  HENT:     Head: Normocephalic and atraumatic.     Right Ear: External ear normal.     Left Ear: External ear normal.     Nose: Nose normal.     Mouth/Throat:     Mouth: Mucous membranes are moist.     Pharynx: Oropharynx is clear.  Eyes:     Extraocular Movements: Extraocular movements intact.     Pupils: Pupils are equal, round, and reactive to light.  Cardiovascular:     Rate and Rhythm: Normal rate and regular rhythm.     Pulses: Normal pulses.     Heart sounds: No murmur heard.   No gallop.  Pulmonary:     Effort: Pulmonary effort is normal. No respiratory  distress.     Breath sounds: Normal breath sounds. No wheezing, rhonchi or rales.  Abdominal:     General: Bowel sounds are normal. There is no distension.     Palpations: Abdomen is soft.     Tenderness: There is no abdominal tenderness.  Musculoskeletal:        General: No swelling or deformity.     Cervical back: Normal range of motion and neck supple.     Right lower leg: No edema.     Left lower leg: No edema.  Skin:    General: Skin is warm and dry.  Neurological:     Mental Status: She is alert.     Comments: Alert and oriented x 3. Normal insight and awareness. Intact Memory. Normal language and speech. Cranial nerve exam unremarkable except for mild left central 7 and left hemifacial sensory deficits. LUE and LLE 1/2 for LT and pain. RUE and RLE notable for stocking glove sensory loss in fingers/below ankle respectively.  Motor 5/5 RUE and RLE. LUE 3+ to 4/5 prox to distal. LLE 4- to 4/5 prox to distal with decreased FMC and position sense. No frank ataxia. No abnl resting tone.   Psychiatric:        Mood and Affect: Mood normal.        Behavior: Behavior normal.        Thought Content: Thought content normal.        Judgment: Judgment normal.    No results found for this or any previous visit (from the past 48 hour(s)). CT HEAD WO CONTRAST ( )  Result Date: 03/12/2022 CLINICAL DATA:  Neuro deficit, acute, stroke suspected CT head EXAM: CT HEAD WITHOUT CONTRAST TECHNIQUE: Contiguous axial images were obtained from the base of the skull through the vertex without intravenous contrast. RADIATION DOSE REDUCTION: This exam was performed according to the departmental dose-optimization program which includes automated exposure control, adjustment of the mA and/or kV according to patient size and/or use of iterative reconstruction technique. COMPARISON:  01/03/2022. FINDINGS: Brain: No evidence of acute large vascular territory infarction, hemorrhage, hydrocephalus, extra-axial  collection or mass lesion/mass effect. Vascular: Right paraclinoid ICA stent. No hyperdense vessel identified. Skull: No acute fracture. Sinuses/Orbits: Clear sinuses.  Unremarkable orbits. Other: No mastoid effusions. IMPRESSION: No evidence of acute intracranial abnormality. Electronically Signed   By: Feliberto Harts M.D.   On: 03/12/2022 15:58  Blood pressure 122/76, pulse 61, temperature 98 F (36.7 C), temperature source Oral, resp. rate 16, height 5\' 5"  (1.651 m), weight 56.2 kg, last menstrual period 10/15/2017, SpO2 98 %.  Medical Problem List and Plan: 1. 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   -Patient had been pretreated for contrast allergy with prednisone and taper as directed.  -patient may shower  -ELOS/Goals: 5-7 days, mod I goals with PT and OT 2.  Antithrombotics: -DVT/anticoagulation:  Mechanical: Antiembolism stockings, thigh (TED hose) Bilateral lower extremities  -antiplatelet therapy: Aspirin 81 mg daily, Brilinta 90 mg twice daily 3. Pain Management/neuropathic pain related to chemotherapy: Neurontin 300 mg twice daily, baclofen 10 mg 3 times daily as needed muscle spasms, hydrocodone as needed  3/21 patient reports that pain is controlled currently 4. Mood: Celexa 10 mg nightly  -antipsychotic agents: N/A 5. Neuropsych: This patient is capable of making decisions on her own behalf. 6. Skin/Wound Care: Routine skin checks 7. Fluids/Electrolytes/Nutrition: Routine in and outs with follow-up chemistries on admit. 8.  History of right breast cancer status postlumpectomy chemo/radiation.   -Follow-up outpatient Dr.Kinard as well as Dr. Barbaraann Cao.   -Continue ARIMIDEX -no blood draws RUE 9.  Asthma/tobacco use.  Continue inhalers as directed.     Mcarthur Rossetti Angiulli, PA-C 03/14/2022

## 2022-03-14 NOTE — Progress Notes (Signed)
?  Inpatient Rehabilitation Admissions Coordinator  ? ?I have received insurance approval and CIR bed is available to admit patient today. I contacted Dr Kathyrn Sheriff, patient , acute team and TOC. I will make the arrangements to admit today. ? ?Danne Baxter, RN, MSN ?Rehab Admissions Coordinator ?[phone] ?03/14/2022 8:26 AM ? ?

## 2022-03-14 NOTE — Progress Notes (Signed)
INPATIENT REHABILITATION ADMISSION NOTE ? ? ?Arrival Method: WC ? ?   ?Mental Orientation: A&OX4 ? ? ?Assessment: done ? ? ?Skin: intact ? ? ?IV'S: present on left FA ? ? ?Pain: None ? ? ?Tubes and Drains: none ? ? ?Safety Measures: done ? ? ?Vital Signs: done ? ? ?Height and Weight: done  ? ? ?Rehab Orientation: done  ? ? ?Family: notified by pt ? ? ? ?Notes:  done ? ?Gerald Stabs, RN ? ?

## 2022-03-14 NOTE — TOC Transition Note (Signed)
Transition of Care (TOC) - CM/SW Discharge Note ? ? ?Patient Details  ?Name: RANDEE HUSTON ?MRN: [id number] ?Date of Birth: [birth year] ? ?Transition of Care (TOC) CM/SW Contact:  ?Pollie Friar, RN ?Phone Number: ?03/14/2022, 10:02 AM ? ? ?Clinical Narrative:    ?Patient discharging to CIR today. CM signing off.  ? ? ?Final next level of care: [place] ?Barriers to Discharge: No Barriers Identified ? ? ?Patient Goals and CMS Choice ?  ?  ?Choice offered to / list presented to : Patient ? ?Discharge Placement ?  ?           ?  ?  ?  ?  ? ?Discharge Plan and Services ?  ?  ?           ?  ?  ?  ?  ?  ?  ?  ?  ?  ?  ? ?Social Determinants of Health (SDOH) Interventions ?  ? ? ?Readmission Risk Interventions ?No flowsheet data found. ? ? ? ? ?

## 2022-03-14 NOTE — Discharge Instructions (Addendum)
Inpatient Rehab Discharge Instructions ? ?Amanda Rich ?Discharge date and time: No discharge date for patient encounter.  ? ?Activities/Precautions/ Functional Status: ?Activity: activity as tolerated ?Diet: regular diet ?Wound Care: Routine skin checks ?Functional status:  ?___ No restrictions     ___ Walk up steps independently ?___ 24/7 supervision/assistance   ___ Walk up steps with assistance ?___ Intermittent supervision/assistance  ___ Bathe/dress independently ?___ Walk with walker     _x__ Bathe/dress with assistance ?___ Walk Independently    ___ Shower independently ?___ Walk with assistance    ___ Shower with assistance ?___ No alcohol     ___ Return to work/school ________ ? ?COMMUNITY REFERRALS UPON DISCHARGE:   ? ?Outpatient: PT     OT                Agency:[facility] Phone: [phone]  ?            Appointment Date/Time: TBD ? ?Medical Equipment/Items Ordered: Conservation officer, nature  ?                                                Agency/Supplier: [employer] [phone] ? ? ?Special Instructions: ? ?No driving smoking or alcohol ? ?My questions have been answered and I understand these instructions. I will adhere to these goals and the provided educational materials after my discharge from the hospital. ? ?Patient/Caregiver Signature _______________________________ Date __________ ? ?Clinician Signature _______________________________________ Date __________ ? ?Please bring this form and your medication list with you to all your follow-up doctor's appointments.   ?

## 2022-03-14 NOTE — Discharge Summary (Signed)
?Physician Discharge Summary  ?Patient ID: ?Amanda Rich ?MRN: [id number] ?DOB/AGE: 54-Aug-1967 54 y.o. ? ?Admit date: 03/10/2022 ?Discharge date: 03/14/2022 ? ?Admission Diagnoses:  ?Cerebral aneurysm ? ?Discharge Diagnoses:  ?Same ?Principal Problem: ?  S/P aneurysm repair ?Active Problems: ?  Cerebral aneurysm, nonruptured ? ? ?Discharged Condition: Stable ? ?Hospital Course:  ?Amanda Rich is a 56 y.o. female Admitted after elective pipeline embolization of a right internal carotid artery aneurysm.  Patient was doing well postoperatively but complained of mild subjective left-sided weakness and incoordination.  Follow-up head CT was unremarkable.  She was seen by Physical and Occupational therapy and found to be a good candidate for a short inpatient rehabilitation stay.  She was transferred to the floor and ultimately discharged to rehab in stable condition.  She was ambulating well, had minimal pain, voiding normally, and tolerating diet. ? ?Treatments: Surgery -   Pipeline embolization of right internal carotid artery aneurysm ? ?Discharge Exam: ?Blood pressure 109/79, pulse 66, temperature 97.8 ?F (36.6 ?C), temperature source Oral, resp. rate 16, height '5\' 5"'$  (1.651 m), weight 56.2 kg, last menstrual period 10/15/2017, SpO2 98 %. ?Awake, alert, oriented ?Speech fluent, appropriate ?CN grossly intact ?5/5 BUE/BLE ?Wound c/d/i ? ?Disposition: Discharge disposition: 02-Transferred to [facility] ? ? ? ? ? ? ?Discharge Instructions   ? ? Call MD for:  redness, tenderness, or signs of infection (pain, swelling, redness, odor or green/yellow discharge around incision site)   Complete by: As directed ?  ? Call MD for:  temperature >100.4   Complete by: As directed ?  ? Diet - low sodium heart healthy   Complete by: As directed ?  ? Discharge instructions   Complete by: As directed ?  ? Walk at home as much as possible, at least 4 times / day  ? Increase activity slowly   Complete by: As directed ?  ?  Lifting restrictions   Complete by: As directed ?  ? No lifting > 10 lbs  ? May shower / Bathe   Complete by: As directed ?  ? 48 hours after surgery  ? May walk up steps   Complete by: As directed ?  ? No dressing needed   Complete by: As directed ?  ? Other Restrictions   Complete by: As directed ?  ? No bending/twisting at waist  ? ?  ? ?Allergies as of 03/14/2022   ? ?   Reactions  ? Clarithromycin Swelling  ? Biaxin  ? Lyrica [pregabalin]   ? Oversedated   ? Tape   ? Rash from Tegaderm  ? Ivp Dye [iodinated Contrast Media] Rash  ? ?  ? ?  ?Medication List  ?  ? ?TAKE these medications   ? ?anastrozole 1 MG tablet ?Commonly known as: ARIMIDEX ?Take 1 tablet (1 mg total) by mouth daily. ?  ?aspirin 81 MG chewable tablet ?Chew 1 tablet (81 mg total) by mouth daily. ?Start taking on: March 15, 2022 ?What changed:  ?how much to take ?how to take this ?when to take this ?  ?baclofen 10 MG tablet ?Commonly known as: LIORESAL ?Take 1 tablet (10 mg total) by mouth 3 (three) times daily as needed for muscle spasms. ?  ?Vitamin D 50 MCG (2000 UT) tablet ?Take 2,000 Units by mouth daily. ?  ?cholecalciferol 25 MCG (1000 UNIT) tablet ?Commonly known as: VITAMIN D3 ?Take 2 tablets (2,000 Units total) by mouth daily. ?  ?citalopram 10 MG tablet ?Commonly known as: CELEXA ?  Take 1 tablet (10 mg total) by mouth daily. ?What changed: when to take this ?  ?diphenhydrAMINE 25 mg capsule ?Commonly known as: BENADRYL ?Take 2 capsules by mouth 1 hour prior to scheduled procedure ?  ?gabapentin 300 MG capsule ?Commonly known as: NEURONTIN ?Take 300 mg by mouth 2 (two) times daily. ?  ?nicotine 7 mg/24hr patch ?Commonly known as: NICODERM CQ - dosed in mg/24 hr ?PLACE 1 PATCH ONTO THE SKIN DAILY. ?  ?predniSONE 50 MG tablet ?Commonly known as: DELTASONE ?Take 1 tablet by mouth 13, 7, and 1 hour prior to scheduled procedure ?  ?pregabalin 75 MG capsule ?Commonly known as: Lyrica ?Take 1 capsule by mouth 2  times daily. ?  ?Spiriva  Respimat 2.5 MCG/ACT Aers ?Generic drug: Tiotropium Bromide Monohydrate ?Inhale 2 puffs into the lungs daily. ?  ?Symbicort 80-4.5 MCG/ACT inhaler ?Generic drug: budesonide-formoterol ?Inhale 2 puffs into the lungs in the morning and at bedtime. ?  ?ticagrelor 90 MG Tabs tablet ?Commonly known as: Brilinta ?Take 1 tablet (90 mg total) by mouth 2 (two) times daily. ?  ?Ventolin HFA 108 (90 Base) MCG/ACT inhaler ?Generic drug: albuterol ?Inhale 2 puffs into the lungs every 6 (six) hours as needed for wheezing or shortness of breath. ?  ?VIACTIV PO ?Take 1 tablet by mouth daily. ?  ? ?  ? ?  ?  ? ? ?  ?Discharge Care Instructions  ?(From admission, onward)  ?  ? ? ?  ? ?  Start     Ordered  ? 03/14/22 0000  No dressing needed       ? 03/14/22 1058  ? ?  ?  ? ?  ? ? Follow-up Information   ? ? Consuella Lose, MD Follow up in 3 week(s).   ?Specialty: Neurosurgery ?Contact information: ?[address] ?[address] ?[place] [address] ?[phone] ? ? ?  ?  ? ?  ?  ? ?  ? ? ?Signed: ?Jairo Ben ?03/14/2022, 10:59 AM ? ? ? ?

## 2022-03-14 NOTE — Progress Notes (Signed)
Inpatient Rehabilitation Admission Medication Review by a Pharmacist ? ?A complete drug regimen review was completed for this patient to identify any potential clinically significant medication issues. ? ?High Risk Drug Classes Is patient taking? Indication by Medication  ?Antipsychotic No   ?Anticoagulant No   ?Antibiotic No   ?Opioid Yes PRN vicodin for pain  ?Antiplatelet Yes Brilinta, aspirin s/p stent  ?Hypoglycemics/insulin No   ?Vasoactive Medication No   ?Chemotherapy Yes, Oral Chemotherapy Arimidex for breast cancer  ?Other Yes Celexa for mood ?Inhalers for COPD, Asthma ?Gabapentin for neuropathy  ? ? ? ?Type of Medication Issue Identified Description of Issue Recommendation(s)  ?Drug Interaction(s) (clinically significant) ?    ?Duplicate Therapy ?    ?Allergy ?    ?No Medication Administration End Date ?    ?Incorrect Dose ?    ?Additional Drug Therapy Needed ?    ?Significant med changes from prior encounter (inform family/care partners about these prior to discharge).    ?Other ?    ? ? ?Clinically significant medication issues were identified that warrant physician communication and completion of prescribed/recommended actions by midnight of the next day:  No ? ?Pharmacist comments: Planning prednisone taper  ? ?Time spent performing this drug regimen review (minutes):  20 minutes ? ? ?Tad Moore ?03/14/2022 2:09 PM ?

## 2022-03-14 NOTE — Progress Notes (Signed)
Meredith Staggers, MD  ?Physician ?Physical Medicine and Rehabilitation ?PMR Pre-admission    ?Signed ?Date of Service:  03/13/2022  3:03 PM ? Related encounter: Admission (Discharged) from 03/10/2022 in [facility] ?  ?Signed    ?  ?Show:Clear all ?'[x]'$ Written'[x]'$ Templated'[x]'$ Copied ? ?Added by: ?'[x]'$ Blanka Rockholt, Vertis Kelch, RN'[x]'$ Meredith Staggers, MD ? ?'[]'$ Hover for details ?   ?   ?   ?   ?   ?   ?   ?   ?   ?   ?   ?   ?   ?   ?   ?   ?   ?   ?   ?   ?   ?   ?   ?   ?   ?   ?   ?   ?   ?   ?   ?   ?   ?   ?   ?   ?   ?   ?   ?   ?   ?   ?   ?   ?   ?   ?   ?   ?   ?   ?   ?   ?   ?   ?   ?   ?   ?   ?   ?   ?   ?   ?   ?   ?   ?   ?   ?   ?   ?   ?   ?   ?   ?   ?   ?   ?   ?   ?   ?   ?   ?   ?   ?   ?   ?   ?   ?   ?   ?   ?   ?   ?   ?   ?   ?   ?   ?   ?   ?   ?   ?   ?   ?   ?   ?   ?   ?   ?   ?   ?   ?   ?   ?   ?   ?   ?   ?   ?   ?   ?   ?   ?   ?   ?   ?   ?   ?   ?   ?   ?   ?   ?   ?   ?   ?   ?   ?   ?   ?   ?   ?   ?   ?   ?   ?   ?   ?   ?   ?   ?   ?   ?   ?PMR Admission Coordinator Pre-Admission Assessment ?  ?Patient: Amanda Rich is an 56 y.o., female ?MRN: [id number] ?DOB: [birth year] ?Height: '5\' 5"'$  (165.1 cm) ?Weight: 56.2 kg ?  ?Insurance Information ?HMO:     PPO:      PCP:      IPA:      80/20:      OTHER:  ?PRIMARY: [place] [employer] Medicaid      Policy#: [id number]  Subscriber: pt ?CM Name: [place]      Phone#: [phone]     Fax#: [phone] ?Pre-Cert#: [id number]  approved  with updates due 3/27    Employer:  ?Benefits:  Phone #: [phone]     Name: 3/20 ?Eff. Date: 11/24/21 to 07/24/2022     Deduct: none      Out of Pocket Max: none      Life Max: none ?CIR: Per Medicaid Guidelines   SNF:  ?Outpatient:      Co-Pay:  ?Home Health:       Co-Pay:  ?DME:      Co-Pay:  ?Providers: in network ? ?SECONDARY: none      ?  ?Financial Counselor:       Phone#:  ?  ?The ?Data Collection Information Summary? for patients in Inpatient Rehabilitation Facilities with attached  ?Privacy Act [place] Records? was provided and verbally reviewed with: N/A ?  ?Emergency Contact Information ?Contact Information   ?  ?  Name Relation Home Work Mobile  ?  Robinson,Lamont Significant other [phone]   [phone]  ?  Carter,Wendy Sister [phone]   [phone]  ?  Dortha Kern Daughter     [phone]  ?  ?   ?  ?Current Medical History  ?Patient Admitting Diagnosis: Aneursym repair ?  ?History of Present Illness: 56 year old right-handed female with history of right breast cancer about 1-1/2 years ago undergoing neoadjuvant chemotherapy which included Taxol.  Patient subsequently underwent lumpectomy with right side axillary lymph node dissection.  She had negative margins on the lumpectomy and nodes were negative for carcinoma.  She did undergo subsequent adjuvant radiation treatments and currently maintained on Arimidex therapy followed by Dr. Gery Pray as well as Dr. Mickeal Skinner.  Unfortunately likely as result of the Taxol therapy she complained of peripheral neuropathy including muscle spasms which involve primarily the right leg.  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  Per chart review patient lives alone and fianc? checks on her as needed..  1 level home 3 steps to entry.  Independent and driving prior to admission.  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 Kathyrn Sheriff.  Patient has been placed on Brilinta and remains on low-dose aspirin.  She had been pretreated for contrast allergy with prednisone as well as Benadryl.  Tolerating a regular consistency diet.   ?  ?Patient's medical record from [facility] has been reviewed by the  rehabilitation admission coordinator and physician. ?  ?Past Medical History  ?    ?Past Medical History:  ?Diagnosis Date  ? Anemia    ? Asthma    ? Cancer [facility])    ?  breast cancer  ? Depression    ? Dyspnea    ? Family history of breast cancer    ? Family history of liver cancer    ? History of radiation therapy 03/10/21-04/11/21  ?  Right breast- Dr. Gery Pray  ? Pericarditis    ? Smoker    ? Umbilical hernia    ?  ?Has the patient had major surgery during 100 days prior to admission? Yes ?  ?Family History   ?family history includes Autism in her half-brother; Breast cancer in her  cousin and another family member; Breast cancer (age of onset: 5) in her maternal aunt; Breast cancer (age of onset: 12) in an other family member; Colon cancer in her cousin; Colon cancer (age of onset: 57) in her maternal uncle; Diabetes in her maternal aunt, mother, and paternal uncle; Hypertension in her mother; Liver cancer (age of onset: 31) in her daughter; Lung cancer in an other family member. ?  ?Current Medications ?  ?Current Facility-Administered Medications:  ?  0.9 %  sodium chloride infusion, , Intravenous, Continuous, Kristeen Miss, MD, Stopped at 03/11/22 0407 ?  albuterol (PROVENTIL) (2.5 MG/3ML) 0.083% nebulizer solution 3 mL, 3 mL, Inhalation, Q6H PRN, Kristeen Miss, MD ?  anastrozole (ARIMIDEX) tablet 1 mg, 1 mg, Oral, Daily, Elsner, Henry, MD, 1 mg at 03/13/22 0813 ?  aspirin chewable tablet 81 mg, 81 mg, Oral, Daily, Kristeen Miss, MD, 81 mg at 03/13/22 0813 ?  baclofen (LIORESAL) tablet 10 mg, 10 mg, Oral, TID PRN, Kristeen Miss, MD ?  Chlorhexidine Gluconate Cloth 2 % PADS 6 each, 6 each, Topical, Q0600, Kristeen Miss, MD, 6 each at 03/13/22 [phone] ?  citalopram (CELEXA) tablet 10 mg, 10 mg, Oral, QHS, Kristeen Miss, MD, 10 mg at 03/13/22 2241 ?  gabapentin (NEURONTIN) capsule 300 mg, 300 mg, Oral, BID, Kristeen Miss, MD, 300 mg at 03/13/22 2241 ?  HYDROcodone-acetaminophen (NORCO/VICODIN) 5-325 MG per  tablet 1 tablet, 1 tablet, Oral, Q4H PRN, Kristeen Miss, MD ?  labetalol (NORMODYNE) injection 10-40 mg, 10-40 mg, Intravenous, Q10 min PRN, Kristeen Miss, MD ?  mometasone-formoterol (DULERA) 100-5 MCG/ACT inhaler 2 puff, 2 puff, Inhalation, BID, Kristeen Miss, MD, 2 puff at 03/14/22 0809 ?  morphine (PF) 2 MG/ML injection 1-2 mg, 1-2 mg, Intravenous, Q2H PRN, Kristeen Miss, MD ?  ondansetron (ZOFRAN) tablet 4 mg, 4 mg, Oral, Q4H PRN **OR** ondansetron (ZOFRAN) injection 4 mg, 4 mg, Intravenous, Q4H PRN, Kristeen Miss, MD ?  pantoprazole (PROTONIX) EC tablet 40 mg, 40 mg, Oral, QHS, Kristeen Miss, MD, 40 mg at 03/13/22 2241 ?  predniSONE (DELTASONE) tablet 40 mg, 40 mg, Oral, Q breakfast, Kristeen Miss, MD, 40 mg at 03/13/22 3335 ?  ticagrelor (BRILINTA) tablet 90 mg, 90 mg, Oral, BID, Kristeen Miss, MD, 90 mg at 03/13/22 2241 ?  umeclidinium bromide (INCRUSE ELLIPTA) 62.5 MCG/ACT 1 puff, 1 puff, Inhalation, Daily, Kristeen Miss, MD, 1 puff at 03/14/22 0810 ?  ?Patients Current Diet:  ?Diet Order   ?  ?         ?    Diet regular Room service appropriate? Yes with Assist; Fluid consistency: Thin  Diet effective now       ?  ?  ?   ?  ?  ?   ?  ?Precautions / Restrictions ?Precautions ?Precautions: Fall ?Restrictions ?Weight Bearing Restrictions: No  ?  ?Has the patient had 2 or more falls or a fall with injury in the past year? No ?  ?Prior Activity Level ?Community (5-7x/wk): Independent and driving ?  ?Prior Functional Level ?Self Care: Did the patient need help bathing, dressing, using the toilet or eating? Independent ?  ?Indoor Mobility: Did the patient need assistance with walking from room to room (with or without device)? Independent ?  ?Stairs: Did the patient need assistance with internal or external stairs (with or without device)? Independent ?  ?Functional Cognition: Did the patient need help planning regular tasks such as shopping or remembering to take medications? Independent ?  ?Patient  Information ?Are you  of Hispanic, Latino/a,or Spanish origin?: A. No, not of Hispanic, Latino/a, or Spanish origin ?What is your race?: B. Black or African American ?Do you need or want an interpreter to communicate with a

## 2022-03-14 NOTE — Consult Note (Signed)
? ?  [facility] CM Inpatient Consult ? ? ?03/14/2022 ? ?Arlyn Dunning ?[birth year] ?[id number] ? ?Managed Medicaid: [employer] ? ?Patient was screened for post acute hospital needs for community follow up and found that patient is being recommended and transitioning to an inpatient rehabilitation program.  Patient is for [facility] Inpatient rehab. ? ?Patient can be followed for post rehab needs when appropriate.  For questions, please contact: ? ?Natividad Brood, RN BSN CCM ?[facility] Liaison ? [phone] business mobile phone ?Toll free office [phone]  ?Fax number: [phone] ?[email or web address] ? ? ?

## 2022-03-14 NOTE — H&P (Signed)
?  ?Physical Medicine and Rehabilitation Admission H&P ?  ?  ?  ?HPI: Amanda Rich is a 56 year old right-handed female with history of right breast cancer about 1-1/2 years ago undergoing neoadjuvant chemotherapy which included Taxol.  Patient subsequently underwent lumpectomy with right side axillary lymph node dissection.  She had negative margins on the lumpectomy and nodes were negative for carcinoma.  She did undergo subsequent adjuvant radiation treatments and currently maintained on Arimidex therapy followed by Dr. Gery Pray as well as Dr. Mickeal Skinner.  Unfortunately likely as result of the Taxol therapy she complained of peripheral neuropathy including muscle spasms which involve primarily the right leg.  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  Per chart review patient lives alone and fianc? checks on her as needed..  1 level home 3 steps to entry.  Independent and driving prior to admission.  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 Kathyrn Sheriff.  Patient has been placed on Brilinta and remains on low-dose aspirin.  She had been pretreated for contrast allergy with prednisone as well as Benadryl.  Tolerating a regular consistency diet.  Therapy evaluations completed noted incoordination with left upper extremity.  Recommendations were for inpatient rehab services. ?  ?Review of Systems  ?Constitutional:  Negative for chills and fever.  ?HENT:  Negative for hearing loss.   ?Eyes:  Negative for double vision.  ?Respiratory:  Positive for shortness of breath. Negative for cough.   ?     Shortness of breath on exertion  ?Cardiovascular:  Negative for chest pain, palpitations and  leg swelling.  ?Gastrointestinal:  Positive for constipation. Negative for heartburn, nausea and vomiting.  ?Genitourinary:  Negative for dysuria, flank pain and hematuria.  ?Musculoskeletal:  Positive for myalgias.  ?Skin:  Negative for rash.  ?Psychiatric/Behavioral:  Positive for depression.   ?All other systems reviewed and are negative. ?    ?Past Medical History:  ?Diagnosis Date  ? Anemia    ? Asthma    ? Cancer [facility])    ?  breast cancer  ? Depression    ? Dyspnea    ? Family history of breast cancer    ? Family history of liver cancer    ? History of radiation therapy 03/10/21-04/11/21  ?  Right breast- Dr. Gery Pray  ? Pericarditis    ? Smoker    ? Umbilical hernia    ?  ?     ?Past Surgical History:  ?Procedure Laterality Date  ? ABLATION ON ENDOMETRIOSIS      ? BREAST BIOPSY Right    ? BREAST LUMPECTOMY WITH RADIOACTIVE SEED AND SENTINEL LYMPH NODE BIOPSY Right 02/02/2021  ?  Procedure: RIGHT BREAST LUMPECTOMY WITH RADIOACTIVE SEED AND RIGHT SENTINEL LYMPH NODE MAPPING;  Surgeon: [name], MD;  Location: [place];  Service: General;  Laterality: Right;  ? GANGLION CYST EXCISION   12/25/2005  ?  L wrist; [facility], [place]  ? IR ANGIO INTRA EXTRACRAN SEL INTERNAL CAROTID BILAT MOD SED   02/06/2022  ? IR ANGIO VERTEBRAL SEL VERTEBRAL BILAT MOD SED   02/06/2022  ? IR IMAGING GUIDED PORT INSERTION   09/10/2020  ? IR TRANSCATH/EMBOLIZ   03/10/2022  ? RADIOLOGY  WITH ANESTHESIA N/A 03/10/2022  ?  Procedure: Pipeline Embolization of Right Internal Carotid Artery Aneurysm;  Surgeon: Consuella Lose, MD;  Location: [place];  Service: Radiology;  Laterality: N/A;  ? TUBAL LIGATION      ?  [facility]  ? UMBILICAL HERNIA REPAIR   12/26/2003  ?  [place]  ?  ?     ?Family History  ?Problem Relation Age of Onset  ? Diabetes Mother    ? Hypertension Mother    ? Liver cancer Daughter 88  ? Diabetes Maternal Aunt    ? Breast cancer Maternal Aunt 26  ? Colon cancer Maternal Uncle 66  ?      or prostate cancer  ?  Colon cancer Cousin    ? Breast cancer Cousin    ? Autism Half-Brother    ? Breast cancer Other 55  ?      maternal second cousin  ? Diabetes Paternal Uncle    ? Breast cancer Other    ?      dx. 42s, paternal second cousin  ? Lung cancer Other    ?      dx. 108s, paternal cousin once removed (father's cousin)  ?  ?Social History:  reports that she has been smoking cigarettes. She has a 25.00 pack-year smoking history. She has never used smokeless tobacco. She reports that she does not currently use alcohol. She reports that she does not use drugs. ?Allergies:  ?     ?Allergies  ?Allergen Reactions  ? Clarithromycin Swelling  ?    Biaxin  ? Lyrica [Pregabalin]    ?    Oversedated   ? Tape    ?    Rash from Tegaderm  ? Ivp Dye [Iodinated Contrast Media] Rash  ?  ?      ?Medications Prior to Admission  ?Medication Sig Dispense Refill  ? albuterol (PROAIR HFA) 108 (90 Base) MCG/ACT inhaler Inhale 2 puffs into the lungs every 6 (six) hours as needed for wheezing or shortness of breath. 18 g 0  ? anastrozole (ARIMIDEX) 1 MG tablet Take 1 tablet (1 mg total) by mouth daily. 90 tablet 3  ? aspirin 81 MG chewable tablet Chew 1 tablet by mouth daily (start taking 7 days prior to scheduled procedure) 30 tablet 6  ? baclofen (LIORESAL) 10 MG tablet Take 1 tablet (10 mg total) by mouth 3 (three) times daily as needed for muscle spasms. 60 each 0  ? budesonide-formoterol (SYMBICORT) 80-4.5 MCG/ACT inhaler Inhale 2 puffs into the lungs in the morning and at bedtime. 10.2 g 12  ? Calcium-Vitamin D-Vitamin K (VIACTIV PO) Take 1 tablet by mouth daily.      ? Cholecalciferol (VITAMIN D) 50 MCG (2000 UT) tablet Take 2,000 Units by mouth daily.      ? citalopram (CELEXA) 10 MG tablet Take 1 tablet (10 mg total) by mouth daily. (Patient taking differently: Take 10 mg by mouth at bedtime.) 90 tablet 3  ? diphenhydrAMINE (BENADRYL) 25 mg capsule Take 2 capsules by mouth 1 hour prior to scheduled procedure 2 capsule 0  ? gabapentin  (NEURONTIN) 300 MG capsule Take 300 mg by mouth 2 (two) times daily.      ? nicotine (NICODERM CQ - DOSED IN MG/24 HR) 7 mg/24hr patch PLACE 1 PATCH ONTO THE SKIN DAILY. 84 patch 0  ? predniSONE (DELTASONE) 50 MG tablet Take 1 tablet by mouth 13, 7, and 1 hour prior to scheduled procedure 3 tablet 0  ?  ticagrelor (BRILINTA) 90 MG TABS tablet Take 1 tablet (90 mg total) by mouth 2 (two) times daily. 60 tablet 6  ? Tiotropium Bromide Monohydrate (SPIRIVA RESPIMAT) 2.5 MCG/ACT AERS Inhale 2 puffs into the lungs daily. 4 g 6  ? cholecalciferol (VITAMIN D3) 25 MCG (1000 UNIT) tablet Take 2 tablets (2,000 Units total) by mouth daily. 90 tablet 0  ? pregabalin (LYRICA) 75 MG capsule Take 1 capsule by mouth 2  times daily. (Patient not taking: Reported on 03/01/2022) 60 capsule 3  ?  ?  ?  ?  ?Home: ?Home Living ?Family/patient expects to be discharged to:: Private residence ?Living Arrangements: Alone (fiance in and out from her home and to his 15 year old father's home) ?Available Help at Discharge: Family, Available PRN/intermittently ?Type of Home: House ?Home Access: Stairs to enter ?Entrance Stairs-Number of Steps: 3 ?Entrance Stairs-Rails: None ?Home Layout: One level ?Bathroom Shower/Tub: Tub/shower unit ?Bathroom Toilet: Standard ?Bathroom Accessibility: Yes ?Home Equipment: None ? Lives With: Alone ?  ?Functional History: ?Prior Function ?Prior Level of Function : Independent/Modified Independent, Driving ?Mobility Comments: Does not use AD. ?ADLs Comments: Generally needing no assist for ADL/IADL.  Occasional assist with driving. ?  ?Functional Status:  ?Mobility: ?Bed Mobility ?Overal bed mobility: Needs Assistance ?Bed Mobility: Supine to Sit, Sit to Supine ?Rolling: Supervision ?Sidelying to sit: Min assist ?Supine to sit: Supervision ?Sit to supine: Supervision ?General bed mobility comments: Incr time and effort ?Transfers ?Overall transfer level: Needs assistance ?Equipment used: Rolling walker (2 wheels), 1  person hand held assist ?Transfers: Sit to/from Stand ?Sit to Stand: Min guard, Min assist ?Bed to/from chair/wheelchair/BSC transfer type:: Step pivot ?Step pivot transfers: Min guard ?General transfer comment:

## 2022-03-15 ENCOUNTER — Encounter (HOSPITAL_COMMUNITY): Payer: Self-pay | Admitting: Physical Medicine and Rehabilitation

## 2022-03-15 DIAGNOSIS — I639 Cerebral infarction, unspecified: Secondary | ICD-10-CM | POA: Diagnosis not present

## 2022-03-15 LAB — CBC WITH DIFFERENTIAL/PLATELET
Abs Immature Granulocytes: 0.05 10*3/uL (ref 0.00–0.07)
Basophils Absolute: 0 10*3/uL (ref 0.0–0.1)
Basophils Relative: 0 %
Eosinophils Absolute: 0 10*3/uL (ref 0.0–0.5)
Eosinophils Relative: 0 %
HCT: 31.9 % — ABNORMAL LOW (ref 36.0–46.0)
Hemoglobin: 10.7 g/dL — ABNORMAL LOW (ref 12.0–15.0)
Immature Granulocytes: 1 %
Lymphocytes Relative: 31 %
Lymphs Abs: 2.5 10*3/uL (ref 0.7–4.0)
MCH: 29 pg (ref 26.0–34.0)
MCHC: 33.5 g/dL (ref 30.0–36.0)
MCV: 86.4 fL (ref 80.0–100.0)
Monocytes Absolute: 0.8 10*3/uL (ref 0.1–1.0)
Monocytes Relative: 11 %
Neutro Abs: 4.4 10*3/uL (ref 1.7–7.7)
Neutrophils Relative %: 57 %
Platelets: 246 10*3/uL (ref 150–400)
RBC: 3.69 MIL/uL — ABNORMAL LOW (ref 3.87–5.11)
RDW: 13.6 % (ref 11.5–15.5)
WBC: 7.8 10*3/uL (ref 4.0–10.5)
nRBC: 0 % (ref 0.0–0.2)

## 2022-03-15 LAB — COMPREHENSIVE METABOLIC PANEL
ALT: 23 U/L (ref 0–44)
AST: 22 U/L (ref 15–41)
Albumin: 3.1 g/dL — ABNORMAL LOW (ref 3.5–5.0)
Alkaline Phosphatase: 41 U/L (ref 38–126)
Anion gap: 10 (ref 5–15)
BUN: 11 mg/dL (ref 6–20)
CO2: 26 mmol/L (ref 22–32)
Calcium: 9 mg/dL (ref 8.9–10.3)
Chloride: 102 mmol/L (ref 98–111)
Creatinine, Ser: 0.56 mg/dL (ref 0.44–1.00)
GFR, Estimated: >60 mL/min (ref >60)
Glucose, Bld: 86 mg/dL (ref 70–99)
Potassium: 3.6 mmol/L (ref 3.5–5.1)
Sodium: 138 mmol/L (ref 135–145)
Total Bilirubin: 0.5 mg/dL (ref 0.3–1.2)
Total Protein: 5.6 g/dL — ABNORMAL LOW (ref 6.5–8.1)

## 2022-03-15 MED ORDER — POTASSIUM CHLORIDE 20 MEQ PO PACK
40.0000 meq | PACK | Freq: Once | ORAL | Status: AC
  Administered 2022-03-15: 40 meq via ORAL

## 2022-03-15 NOTE — Progress Notes (Signed)
Inpatient Rehabilitation  Patient information reviewed and entered into eRehab system by James Senn M. Camarie Mctigue, M.A., CCC/SLP, PPS Coordinator.  Information including medical coding, functional ability and quality indicators will be reviewed and updated through discharge.    

## 2022-03-15 NOTE — Progress Notes (Signed)
Had an uneventful night. Well settled and awaiting therapy sessions today. Tylenol given for pain. Hemodynamically stable. Assisted to bathroom as needed. Safety maintained at all times. ?

## 2022-03-15 NOTE — Progress Notes (Signed)
Inpatient Rehabilitation Care Coordinator ?Assessment and Plan ?Patient Details  ?Name: Amanda Rich ?MRN: [id number] ?Date of Birth: 1966/02/05 ? ?Today's Date: 03/15/2022 ? ?Hospital Problems: Principal Problem: ?  Subcortical infarction [facility]) ?Active Problems: ?  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 ?Past Medical History:  ?Past Medical History:  ?Diagnosis Date  ? Anemia   ? Asthma   ? Cancer [facility])   ? breast cancer  ? Depression   ? Dyspnea   ? Family history of breast cancer   ? Family history of liver cancer   ? History of radiation therapy 03/10/21-04/11/21  ? Right breast- Dr. Gery Pray  ? Pericarditis   ? Smoker   ? Umbilical hernia   ? ?Past Surgical History:  ?Past Surgical History:  ?Procedure Laterality Date  ? ABLATION ON ENDOMETRIOSIS    ? BREAST BIOPSY Right   ? BREAST LUMPECTOMY WITH RADIOACTIVE SEED AND SENTINEL LYMPH NODE BIOPSY Right 02/02/2021  ? Procedure: RIGHT BREAST LUMPECTOMY WITH RADIOACTIVE SEED AND RIGHT SENTINEL LYMPH NODE MAPPING;  Surgeon: [name], MD;  Location: [place];  Service: General;  Laterality: Right;  ? GANGLION CYST EXCISION  12/25/2005  ? L wrist; [facility], [place]  ? IR ANGIO INTRA EXTRACRAN SEL INTERNAL CAROTID BILAT MOD SED  02/06/2022  ? IR ANGIO INTRA EXTRACRAN SEL INTERNAL CAROTID UNI R MOD SED  03/10/2022  ? IR ANGIO VERTEBRAL SEL VERTEBRAL BILAT MOD SED  02/06/2022  ? IR ANGIOGRAM FOLLOW UP STUDY  03/14/2022  ? IR IMAGING GUIDED PORT INSERTION  09/10/2020  ? IR NEURO EACH ADD'L AFTER BASIC UNI RIGHT (MS)  03/14/2022  ? IR TRANSCATH/EMBOLIZ  03/10/2022  ? RADIOLOGY WITH ANESTHESIA N/A 03/10/2022  ? Procedure: Pipeline Embolization of Right Internal Carotid Artery Aneurysm;  Surgeon: Consuella Lose, MD;  Location: [place];  Service: Radiology;  Laterality: N/A;  ? TUBAL LIGATION    ? [facility]  ? UMBILICAL HERNIA REPAIR  12/26/2003  ? [place]  ? ?Social History:  reports that she has been smoking cigarettes. She has a 25.00  pack-year smoking history. She has never used smokeless tobacco. She reports that she does not currently use alcohol. She reports that she does not use drugs. ? ?Family / Support Systems ?Spouse/Significant Other: Rhae Hammock ?Children: Santiago Bumpers, Dortha Kern ?Anticipated Caregiver: Vincente Liberty ?Ability/Limitations of Caregiver: PRN ?Caregiver Availability: Intermittent ?Family Dynamics: support from S.O and daughters ? ?Social History ?Preferred language: English ?Religion: Christian ?Health Literacy - How often do you need to have someone help you when you read instructions, pamphlets, or other written material from your doctor or pharmacy?: Never ?Writes: Yes ?Legal History/Current Legal Issues: n/a ?Guardian/Conservator: n/a  ? ?Abuse/Neglect ?Abuse/Neglect Assessment Can Be Completed: Yes ?Physical Abuse: Denies ?Verbal Abuse: Denies ?Sexual Abuse: Denies ?Exploitation of patient/patient's resources: Denies ?Self-Neglect: Denies ? ?Patient response to: ?Social Isolation - How often do you feel lonely or isolated from those around you?: Never ? ?Emotional Status ?Recent Psychosocial Issues: depression, coping ?Psychiatric History: hx of depression ?Substance Abuse History: hc of tobacco ? ?Patient / Family Perceptions, Expectations & Goals ?Pt/Family understanding of illness & functional limitations: yes ?Premorbid pt/family roles/activities: indepdent and driving ?Anticipated changes in roles/activities/participation: spouse anticipating to discharge home with PRN assistance from S.O. ?Pt/family expectations/goals: MOD I ? ?Community Resources ?Community Agencies: None ?Premorbid Home Care/DME Agencies: None ?Transportation available at discharge: fiance able to transport ?Is the patient able to respond to transportation needs?: Yes ?In the past 12 months, has lack  of transportation kept you from medical appointments or from getting medications?: No ?In the past 12 months, has lack of transportation kept  you from meetings, work, or from getting things needed for daily living?: No ?Resource referrals recommended: Neuropsychology ? ?Discharge Planning ?Living Arrangements: Alone (Fiance check in on patient and her 56 y.o) ?Support Systems: Children, Spouse/significant other ?Type of Residence: Private residence (1 level home, 3 steps) ?Insurance Resources: [employer] (specify county[place] ([place]) ?Financial Resources: Family Support ?Financial Screen Referred: No ?Living Expenses: Own ?Money Management: Patient, Spouse ?Does the patient have any problems obtaining your medications?: No ?Home Management: independent ?Patient/Family Preliminary Plans: patient able to manage ?Care Coordinator Barriers to Discharge: Lack of/limited family support, Insurance for SNF coverage ?Care Coordinator Anticipated Follow Up Needs: HH/OP ?Expected length of stay: 5-7 Days ? ?Clinical Impression ?SW met with patient, introduced self and explain role. Addressed questions and concerns. The patient is very motivated and will benefit from CIR. Goal is to return home with PRN assistance from S.O.  ? ?Dyanne Iha ?03/15/2022, 1:35 PM ? ?  ?

## 2022-03-15 NOTE — Evaluation (Signed)
Occupational Therapy Assessment and Plan ? ?Patient Details  ?Name: Amanda Rich ?MRN: [id number] ?Date of Birth: 1966/10/02 ? ?OT Diagnosis: acute pain, muscle weakness (generalized), and hemiparesis ?Rehab Potential: Rehab Potential (ACUTE ONLY): Good ?ELOS: 5-7 days  ? ?Today's Date: 03/15/2022 ?OT Individual Time: 9735-3299 ?OT Individual Time Calculation (min): 75 min    ? ?Hospital Problem: Principal Problem: ?  Subcortical infarction [facility]) ?Active Problems: ?  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 ? ?Past Medical History:  ?Past Medical History:  ?Diagnosis Date  ? Anemia   ? Asthma   ? Cancer [facility])   ? breast cancer  ? Depression   ? Dyspnea   ? Family history of breast cancer   ? Family history of liver cancer   ? History of radiation therapy 03/10/21-04/11/21  ? Right breast- Dr. Gery Pray  ? Pericarditis   ? Smoker   ? Umbilical hernia   ? ?Past Surgical History:  ?Past Surgical History:  ?Procedure Laterality Date  ? ABLATION ON ENDOMETRIOSIS    ? BREAST BIOPSY Right   ? BREAST LUMPECTOMY WITH RADIOACTIVE SEED AND SENTINEL LYMPH NODE BIOPSY Right 02/02/2021  ? Procedure: RIGHT BREAST LUMPECTOMY WITH RADIOACTIVE SEED AND RIGHT SENTINEL LYMPH NODE MAPPING;  Surgeon: [name], MD;  Location: [place] [facility];  Service: General;  Laterality: Right;  ? GANGLION CYST EXCISION  12/25/2005  ? L wrist; [facility], [place]  ? IR ANGIO INTRA EXTRACRAN SEL INTERNAL CAROTID BILAT MOD SED  02/06/2022  ? IR ANGIO INTRA EXTRACRAN SEL INTERNAL CAROTID UNI R MOD SED  03/10/2022  ? IR ANGIO VERTEBRAL SEL VERTEBRAL BILAT MOD SED  02/06/2022  ? IR ANGIOGRAM FOLLOW UP STUDY  03/14/2022  ? IR IMAGING GUIDED PORT INSERTION  09/10/2020  ? IR NEURO EACH ADD'L AFTER BASIC UNI RIGHT (MS)  03/14/2022  ? IR TRANSCATH/EMBOLIZ  03/10/2022  ? RADIOLOGY WITH ANESTHESIA N/A 03/10/2022  ? Procedure: Pipeline Embolization of Right Internal Carotid Artery Aneurysm;  Surgeon: Consuella Lose, MD;   Location: [place];  Service: Radiology;  Laterality: N/A;  ? TUBAL LIGATION    ? [facility]  ? UMBILICAL HERNIA REPAIR  12/26/2003  ? [place]  ? ? ?Assessment & Plan ?Clinical Impression:  ? ?Amanda Rich is a 56 year old right-handed female with history of right breast cancer about 1-1/2 years ago undergoing neoadjuvant chemotherapy which included Taxol.  Patient subsequently underwent lumpectomy with right side axillary lymph node dissection.  She had negative margins on the lumpectomy and nodes were negative for carcinoma.  She did undergo subsequent adjuvant radiation treatments and currently maintained on Arimidex therapy followed by Dr. Gery Pray as well as Dr. Mickeal Skinner.  Unfortunately likely as result of the Taxol therapy she complained of peripheral neuropathy including muscle spasms which involve primarily the right leg.  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  Per chart review patient lives alone and fianc? checks on her as needed..  1 level home 3 steps to entry.  Independent and driving prior to admission.  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 Kathyrn Sheriff.  Patient has been placed on Brilinta and remains on low-dose aspirin.  She had been pretreated for contrast allergy  with prednisone as well as Benadryl.  Tolerating a regular consistency diet.  Therapy evaluations completed noted incoordination with left upper extremity.  Recommendations were for inpatient rehab services. Patient transferred to CIR on 03/14/2022 .   ? ?Patient currently requires min with basic self-care skills secondary to muscle weakness, decreased cardiorespiratoy endurance, impaired timing and sequencing,  decreased coordination, and decreased motor planning, and decreased standing balance and decreased balance strategies.  Prior to hospitalization, patient could complete all self-care independently. ? ?Patient will benefit from skilled intervention to increase independence with basic self-care skills prior to discharge home independently.  Anticipate patient will require intermittent supervision and follow up outpatient. ? ?OT - End of Session ?Activity Tolerance: Tolerates 10 - 20 min activity with multiple rests ?Endurance Deficit: Yes ?Endurance Deficit Description: reported fatigue during seated ADLs with rest break required intermittently as well as during amb transfer ?OT Assessment ?Rehab Potential (ACUTE ONLY): Good ?OT Barriers to Discharge: Inaccessible home environment;Lack of/limited family support ?OT Patient demonstrates impairments in the following area(s): Balance;Motor;Endurance;Safety;Pain ?OT Basic ADL's Functional Problem(s): Eating;Grooming;Bathing;Dressing;Toileting ?OT Advanced ADL's Functional Problem(s): Simple Meal Preparation ?OT Transfers Functional Problem(s): Toilet;Tub/Shower ?OT Additional Impairment(s): Fuctional Use of Upper Extremity ?OT Plan ?OT Intensity: Minimum of 1-2 x/day, 45 to 90 minutes ?OT Frequency: 5 out of 7 days ?OT Duration/Estimated Length of Stay: 5-7 days ?OT Treatment/Interventions: Balance/vestibular training;Discharge planning;Pain management;Self Care/advanced ADL retraining;Therapeutic Activities;UE/LE Coordination activities;Functional mobility training;Patient/family education;Therapeutic Exercise;UE/LE Strength taining/ROM;Psychosocial support;DME/adaptive equipment instruction;Community reintegration ?OT Self Feeding Anticipated Outcome(s): Indep ?OT Basic Self-Care Anticipated Outcome(s): Mod I ?OT Toileting Anticipated Outcome(s): Mod I ?OT Bathroom Transfers Anticipated Outcome(s): Mod I ?OT Recommendation ?Patient destination: Home ?Follow Up  Recommendations: Other (comment);Outpatient OT (intermittent supervision) ?Equipment Recommended: Tub/shower bench;To be determined ? ? ?OT Evaluation ?Precautions/Restrictions  ?Precautions ?Precautions: Fall ?Restrictions ?Weight Bearing Restrictions: No ?Home Living/Prior Functioning ?Home Living ?Family/patient expects to be discharged to:: Private residence ?Living Arrangements: Alone (Fiance check in on patient and her 56 y.o) ?Available Help at Discharge: Family, Available PRN/intermittently (has family available only on weekends, with fiance available intermittently but works 3rd shift and is caregiving for his dad) ?Type of Home: House ?Home Access: Stairs to enter ?Entrance Stairs-Number of Steps: 3 ?Entrance Stairs-Rails: None ?Home Layout: One level ?Bathroom Shower/Tub: Tub/shower unit, Curtain ?Bathroom Toilet: Standard ?Bathroom Accessibility: Yes ? Lives With: Alone ?IADL History ?Homemaking Responsibilities: Yes ?Meal Prep Responsibility: Primary ?Current License: Yes ?Mode of Transportation: Car ?Occupation: Unemployed ?Leisure and Hobbies: reading, crafts ?IADL Comments: family takes care of yard, meals and house keeping ?Prior Function ?Level of Independence: Independent with basic ADLs, Independent with transfers, Needs assistance with homemaking, Independent with gait ?Laundry: Total ?Light Housekeeping: Total ?Yard Work: Total ? Able to Take Stairs?: Yes ?Driving: Yes ?Vocation: Unemployed ?Vision ?Baseline Vision/History: 1 Wears glasses ?Ability to See in Adequate Light: 0 Adequate ?Patient Visual Report: No change from baseline ?Vision Assessment?: No apparent visual deficits ?Perception  ?Perception: Within Functional Limits ?Praxis ?Praxis: Impaired ?Praxis Impairment Details: Motor planning ?Cognition ?Cognition ?Overall Cognitive Status: Within Functional Limits for tasks assessed ?Arousal/Alertness: Awake/alert ?Orientation Level: Person;Place;Situation ?Person: Oriented ?Place:  Oriented ?Situation: Oriented ?Memory: Appears intact ?Awareness: Appears intact ?Problem Solving: Impaired ?Safety/Judgment: Appears intact ?Brief Interview for Mental Status (BIMS) ?Repetition of Three Words (First Attempt):

## 2022-03-15 NOTE — Evaluation (Signed)
Physical Therapy Assessment and Plan ? ?Patient Details  ?Name: Amanda Rich ?MRN: [id number] ?Date of Birth: Jun 11, 1966 ? ?PT Diagnosis: Abnormal posture, Abnormality of gait, Cognitive deficits, Coordination disorder, Difficulty walking, Hemiplegia non-dominant, Impaired sensation, Muscle weakness, and Pain in joint ?Rehab Potential: Excellent ?ELOS: 7-10 days  ? ?Today's Date: 03/15/2022 ?PT Individual Time: 4081-4481 ?PT Individual Time Calculation (min): 60 min   ? ?Hospital Problem: Principal Problem: ?  Subcortical infarction [facility]) ?Active Problems: ?  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 ? ?Past Medical History:  ?Past Medical History:  ?Diagnosis Date  ? Anemia   ? Asthma   ? Cancer [facility])   ? breast cancer  ? Depression   ? Dyspnea   ? Family history of breast cancer   ? Family history of liver cancer   ? History of radiation therapy 03/10/21-04/11/21  ? Right breast- Dr. Gery Pray  ? Pericarditis   ? Smoker   ? Umbilical hernia   ? ?Past Surgical History:  ?Past Surgical History:  ?Procedure Laterality Date  ? ABLATION ON ENDOMETRIOSIS    ? BREAST BIOPSY Right   ? BREAST LUMPECTOMY WITH RADIOACTIVE SEED AND SENTINEL LYMPH NODE BIOPSY Right 02/02/2021  ? Procedure: RIGHT BREAST LUMPECTOMY WITH RADIOACTIVE SEED AND RIGHT SENTINEL LYMPH NODE MAPPING;  Surgeon: [name], MD;  Location: [place];  Service: General;  Laterality: Right;  ? GANGLION CYST EXCISION  12/25/2005  ? L wrist; [facility], [place]  ? IR ANGIO INTRA EXTRACRAN SEL INTERNAL CAROTID BILAT MOD SED  02/06/2022  ? IR ANGIO INTRA EXTRACRAN SEL INTERNAL CAROTID UNI R MOD SED  03/10/2022  ? IR ANGIO VERTEBRAL SEL VERTEBRAL BILAT MOD SED  02/06/2022  ? IR ANGIOGRAM FOLLOW UP STUDY  03/14/2022  ? IR IMAGING GUIDED PORT INSERTION  09/10/2020  ? IR NEURO EACH ADD'L AFTER BASIC UNI RIGHT (MS)  03/14/2022  ? IR TRANSCATH/EMBOLIZ  03/10/2022  ? RADIOLOGY WITH ANESTHESIA N/A 03/10/2022  ? Procedure: Pipeline  Embolization of Right Internal Carotid Artery Aneurysm;  Surgeon: Consuella Lose, MD;  Location: [place];  Service: Radiology;  Laterality: N/A;  ? TUBAL LIGATION    ? [facility]  ? UMBILICAL HERNIA REPAIR  12/26/2003  ? [place]  ? ? ?Assessment & Plan ?Clinical Impression: Amanda Rich is a 56 year old right-handed female with history of right breast cancer about 1-1/2 years ago undergoing neoadjuvant chemotherapy which included Taxol.  Patient subsequently underwent lumpectomy with right side axillary lymph node dissection.  She had negative margins on the lumpectomy and nodes were negative for carcinoma.  She did undergo subsequent adjuvant radiation treatments and currently maintained on Arimidex therapy followed by Dr. Gery Pray as well as Dr. Mickeal Skinner.  Unfortunately likely as result of the Taxol therapy she complained of peripheral neuropathy including muscle spasms which involve primarily the right leg.  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  Per chart review patient lives alone and fianc? checks on her as needed..  1 level home 3 steps to entry.  Independent and driving prior to admission.  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 Kathyrn Sheriff.  Patient has been placed on Brilinta and remains on low-dose aspirin.  She had been pretreated for contrast allergy with prednisone as well as Benadryl.  Tolerating a regular consistency diet.  Therapy evaluations completed noted incoordination with left upper extremity.  Recommendations were for inpatient rehab services..  Patient transferred to CIR on 03/14/2022 .  ? ?Patient currently requires min with mobility secondary to muscle weakness,  decreased cardiorespiratoy endurance, impaired timing and sequencing, decreased coordination, and decreased motor planning, decreased memory and delayed processing, and decreased standing balance, decreased postural control, hemiplegia, and decreased balance strategies.  Prior to hospitalization, patient was independent  with mobility and lived with Alone in a House home.  Home access is 3Stairs to enter. ? ?Patient will benefit from skilled PT intervention to maximize safe functional mobility, minimize fall risk, and decrease caregiver burden for planned discharge home with intermittent assist.  Anticipate patient will benefit from follow up [facility] at discharge. ? ?PT - End of Session ?Activity Tolerance: Tolerates 10 - 20 min activity with multiple rests ?Endurance Deficit: Yes ?Endurance Deficit Description:  (reported fatigue with rest break required after ambulation 121f and after stairs.) ?PT Assessment ?Rehab Potential (ACUTE/IP ONLY): Excellent ?PT Barriers to Discharge: Decreased caregiver support;Home environment access/layout ?PT Patient demonstrates impairments in the following area(s): Balance;Endurance;Motor;Sensory;Safety ?PT Transfers Functional Problem(s): Bed Mobility;Bed to Chair;Car;Furniture;Floor ?PT Locomotion Functional Problem(s): Ambulation;Stairs;Wheelchair Mobility ?PT Plan ?PT Intensity: Minimum of 1-2 x/day ,45 to 90 minutes ?PT Frequency: 5 out of 7 days ?PT Duration Estimated Length of Stay: 7-10 days ?PT Treatment/Interventions: Ambulation/gait training;Balance/vestibular training;Community reintegration;Discharge planning;Patient/family education;Stair training;Therapeutic Activities;Therapeutic Exercise;Cognitive remediation/compensation;DME/adaptive equipment instruction;Functional mobility training;Neuromuscular re-education;Pain management;Splinting/orthotics;UE/LE Strength taining/ROM;UE/LE Coordination activities ?PT Transfers Anticipated Outcome(s): mod I with LRAD ?PT  Locomotion Anticipated Outcome(s): Mod I at ambulatory level with LRAD; Supervision stairs ?PT Recommendation ?Recommendations for Other Services: Speech consult ?Follow Up Recommendations: Home health PT ?Patient destination: Home ?Equipment Recommended: Rolling walker with 5" wheels ? ? ?PT Evaluation ?Precautions/Restrictions ?Precautions ?Precautions: Fall ?Restrictions ?Weight Bearing Restrictions: No ?General ?  Vital Signs  ?Pain ?  ?Pain Interference ?Pain Interference ?Pain Effect on Sleep: 0. Does not apply - I have not had any pain or hurting in the past 5 days;1. Rarely or not at all ?Pain Interference with Therapy Activities: 1. Rarely or not at all ?Pain Interference with Day-to-Day Activities: 2. Occasionally (pain in L knee and R occipital region where procedure was performed) ?Home Living/Prior Functioning ?Home Living ?Available Help at Discharge: Family;Available PRN/intermittently ?Type of Home: House ?Home Access: Stairs to enter ?Entrance Stairs-Number of Steps: 3 ?Entrance Stairs-Rails: Left (Has L side rail on the back porch, no rail on the front porch) ?Home Layout: One level ?Bathroom Shower/Tub: Tub/shower unit;Curtain ?Bathroom Toilet: Standard ?Bathroom Accessibility: Yes ? Lives With: Alone ?Prior Function ?Level of Independence: Independent with basic ADLs;Independent with transfers;Needs assistance with homemaking;Independent with gait ?Laundry: Total ?Light Housekeeping: Total ?Yard Work: Total ?HLandComments: Needs assistance with homemaking activities ? Able to Take Stairs?: Yes ?Driving: Yes ?Vocation: Unemployed ?Vision/Perception  ?Vision - History ?Baseline Vision: No visual deficits ?Patient Visual Report: Other (comment) (pt reports she sees spots when initially standing) ?Vision - Assessment ?Eye Alignment: Within Functional Limits ?Perception ?Perception: Within Functional Limits ?Praxis ?Praxis: Impaired ?Praxis Impairment Details: Motor planning   ?Cognition ?Overall Cognitive Status: Impaired/Different from baseline ?Arousal/Alertness: Awake/alert ?Orientation Level: Oriented X4 ?Year: 2023 ?Month: March ?Day of Week: Correct ?Attention: Focused;Sustained ?Focused

## 2022-03-15 NOTE — Progress Notes (Signed)
?                                                       PROGRESS NOTE ? ? ?Subjective/Complaints: ?Slept well last night ?She is getting improved sensation and motor control in her left leg.  ?Her left leg is what is bothering her the most ? ?ROS: Decreased sensation left leg ? ? ?Objective: ?  ?No results found. ?Recent Labs  ?  03/15/22 ?0530  ?WBC 7.8  ?HGB 10.7*  ?HCT 31.9*  ?PLT 246  ? ?Recent Labs  ?  03/15/22 ?0530  ?NA 138  ?K 3.6  ?CL 102  ?CO2 26  ?GLUCOSE 86  ?BUN 11  ?CREATININE 0.56  ?CALCIUM 9.0  ? ? ?Intake/Output Summary (Last 24 hours) at 03/15/2022 1133 ?Last data filed at 03/15/2022 [phone] ?Gross per 24 hour  ?Intake 358 ml  ?Output --  ?Net 358 ml  ?  ? ?  ? ?Physical Exam: ?Vital Signs ?Blood pressure 121/79, pulse 62, temperature 98.7 ?F (37.1 ?C), temperature source Oral, resp. rate 14, height '5\' 5"'$  (1.651 m), weight 56.5 kg, last menstrual period 10/15/2017, SpO2 99 %. ?Gen: no distress, normal appearing ?HEENT: oral mucosa pink and moist, NCAT ?Cardio: Reg rate ?Chest: normal effort, normal rate of breathing ?Abd: soft, non-distended ?Ext: no edema ?Psych: pleasant, normal affect ?Skin: intact ?Neurological:  ?   Mental Status: She is alert.  ?   Comments: Alert and oriented x 3. Normal insight and awareness. Intact Memory. Normal language and speech. Cranial nerve exam unremarkable except for mild left central 7 and left hemifacial sensory deficits. LUE and LLE 1/2 for LT and pain. RUE and RLE notable for stocking glove sensory loss in fingers/below ankle respectively.  Motor 5/5 RUE and RLE. LUE 3+ to 4/5 prox to distal. LLE 4- to 4/5 prox to distal with decreased Wat[place] and position sense. No frank ataxia. No abnl resting tone.   ?Psychiatric:     ?   Mood and Affect: Mood normal.     ?   Behavior: Behavior normal.     ?   Thought Content: Thought content normal.     ?   Judgment: Judgment normal.  ? ? ?Assessment/Plan: ?1. Functional deficits which require 3+ hours per day of interdisciplinary  therapy in a comprehensive inpatient rehab setting. ?Physiatrist is providing close team supervision and 24 hour management of active medical problems listed below. ?Physiatrist and rehab team continue to assess barriers to discharge/monitor patient progress toward functional and medical goals ? ?Care Tool: ? ?Bathing ?   ?Body parts bathed by patient: Right arm, Left arm, Chest, Abdomen, Right upper leg, Left upper leg, Right lower leg, Left lower leg, Face  ?   ?  ?  ?Bathing assist Assist Level: Minimal Assistance - Patient > 75% ?  ?  ?Upper Body Dressing/Undressing ?Upper body dressing Upper body dressing/undressing activity did not occur (including orthotics): Safety/medical concerns ?What is the patient wearing?: Bra, Pull over shirt ?   ?Upper body assist Assist Level: Supervision/Verbal cueing ?   ?Lower Body Dressing/Undressing ?Lower body dressing ? ? ? Lower body dressing activity did not occur: Safety/medical concerns ?What is the patient wearing?: Underwear/pull up, Pants ? ?  ? ?Lower body assist Assist for lower body dressing: Minimal Assistance - Patient > 75% ?   ? ?  Toileting ?Toileting    ?Toileting assist Assist for toileting: Minimal Assistance - Patient > 75% ?  ?  ?Transfers ?Chair/bed transfer ? ?Transfers assist ? Chair/bed transfer activity did not occur: Safety/medical concerns ? ?  ?  ?  ?Locomotion ?Ambulation ? ? ?Ambulation assist ? ? Ambulation activity did not occur: Safety/medical concerns ? ?Assist level: Minimal Assistance - Patient > 75% ?Assistive device: Walker-rolling ?Max distance: 12 ft  ? ?Walk 10 feet activity ? ? ?Assist ?   ? ?Assist level: Minimal Assistance - Patient > 75% ?Assistive device: Walker-rolling  ? ?Walk 50 feet activity ? ? ?Assist   ? ?  ?   ? ? ?Walk 150 feet activity ? ? ?Assist   ? ?  ?  ?  ? ?Walk 10 feet on uneven surface  ?activity ? ? ?Assist   ? ? ?  ?   ? ?Wheelchair ? ? ? ? ?Assist   ?  ?  ? ?  ?   ? ? ?Wheelchair 50 feet with 2 turns  activity ? ? ? ?Assist ? ?  ?  ? ? ?   ? ?Wheelchair 150 feet activity  ? ? ? ?Assist ?   ? ? ?   ? ?Blood pressure 121/79, pulse 62, temperature 98.7 ?F (37.1 ?C), temperature source Oral, resp. rate 14, height '5\' 5"'$  (1.651 m), weight 56.5 kg, last menstrual period 10/15/2017, SpO2 99 %. ? ? ? Medical Problem List and Plan: ?1. 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   ?-Patient had been pretreated for contrast allergy with prednisone and taper as directed. ?            -patient may shower ?            -ELOS/Goals: 5-7 days, mod I goals with PT and OT ?2.  Antithrombotics: ?-DVT/anticoagulation:  Mechanical: Antiembolism stockings, thigh (TED hose) Bilateral lower extremities ?            -antiplatelet therapy: Aspirin 81 mg daily, Brilinta 90 mg twice daily ?3. Pain Management/neuropathic pain related to chemotherapy: Neurontin 300 mg twice daily, baclofen 10 mg 3 times daily as needed muscle spasms, hydrocodone as needed ?            3/21 patient reports that pain is controlled currently ?4. Mood: Celexa 10 mg nightly ?            -antipsychotic agents: N/A ?5. Neuropsych: This patient is capable of making decisions on her own behalf. ?6. Skin/Wound Care: Routine skin checks ?7. Fluids/Electrolytes/Nutrition: Routine in and outs with follow-up chemistries on admit. ?8.  History of right breast cancer status postlumpectomy chemo/radiation.   ?-Follow-up outpatient Dr.Kinard as well as Dr. Mickeal Skinner.   ?-Continue ARIMIDEX ?-no blood draws RUE ?9.  Asthma/tobacco use.  Continue inhalers as directed. ?10. Hypoalbuminemia: recommended choosing protein rich foods ?11. Hyperglycemia: discussed role of sugar and stress in elevating CBGs, discussed that they have greatly improved ?12. Suboptimal potassium: supplement 92mq klor today ? ?LOS: ?1 days ?A FACE TO FACE EVALUATION WAS PERFORMED ? ?KMartha ClanP Aaisha Sliter ?03/15/2022,  11:33 AM  ? ?  ?

## 2022-03-15 NOTE — Patient Care Conference (Signed)
Inpatient RehabilitationTeam Conference and Plan of Care Update ?Date: 03/15/2022   Time: 11:44 AM  ? ? ?Patient Name: Amanda Rich      ?Medical Record Number: [id number]  ?Date of Birth: [birth year] ?Sex: Female         ?Room/Bed: 1U27O/5D66Y-40 ?Payor Info: Payor: [place] / Plan: [place] MEDICAID [facility] / Product Type: *No Product type* /   ? ?Admit Date/Time:  03/14/2022  1:39 PM ? ?Primary Diagnosis:  Subcortical infarction ([place]) ? ?Hospital Problems: Principal Problem: ?  Subcortical infarction [facility]) ?Active Problems: ?  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 ? ? ?Expected Discharge Date: Expected Discharge Date:  (evals pending) ? ?Team Members Present: ?Physician leading conference: Dr. Leeroy Cha ?Social Worker Present: Erlene Quan, BSW ?Nurse Present: Dorien Chihuahua, RN ?PT Present: Becky Sax, PT ?OT Present: Other (comment) [facility] Alphonsa Gin, [place]) ?SLP Present: Other (comment) Romelle Starcher, SLP) ?PPS Coordinator present : Gunnar Fusi, SLP ? ?   Current Status/Progress Goal Weekly Team Focus  ?Bowel/Bladder ? ? Continent of B/B.  Maintain Continence.  Assist to Bathroom as needed.   ?Swallow/Nutrition/ Hydration ? ?           ?ADL's ? ? min A LB self-care, min A toileting, min A 12 ft ambulatory transfer with RW  mod I, supervision  ADL retraining, endurance, fine motor/gross grasping, strengthening   ?Mobility ? ?           ?Communication ? ?           ?Safety/Cognition/ Behavioral Observations ?           ?Pain ? ? Complains of Rt Groin Pain. Analgesics are prescribed.  Pain <3/10.  Assess Q shift and prn.   ?Skin ? ? Erythema at Rt Groin (Surgical incision), otherwise skin grossly intact.  Prevent any new skin breakdown.  Assess Q shift and prn.   ? ? ?Discharge Planning:  ?    ?Team Discussion: ?Patient with left lower extreminty coordination issues post sub cortical infarct but sensation is improving. MD added potassium supplement and protein  foods recommended.  ? ?Patient on target to meet rehab goals: ?yes, currently needs mi assist for lower body care and toileting, Able to ambulate 12' using a RW with HHA and CGA for steps. Addressing slow motor planning deficits.  ? ?*See Care Plan and progress notes for long and short-term goals.  ? ?Revisions to Treatment Plan:  ?N/A  ?Teaching Needs: ?Safety, transfers, toileting, medications, dietary modification, etc  ?Current Barriers to Discharge: ?Home enviroment access/layout and Lack of/limited family support ? ?Possible Resolutions to Barriers: ?Education with fiance' ?  ? ? Medical Summary ?  ?  ?  ?  ? ? ?  ? ? ?I attest that I was present, lead the team conference, and concur with the assessment and plan of the team. ? ? ?Dorien Chihuahua B ?03/15/2022, 4:51 PM  ? ? ? ? ? ? ?

## 2022-03-15 NOTE — Plan of Care (Signed)
?  Problem: RH Balance ?Goal: LTG Patient will maintain dynamic standing balance (PT) ?Description: LTG:  Patient will maintain dynamic standing balance with assistance during mobility activities (PT) ?Flowsheets (Taken 03/15/2022 1750) ?LTG: Pt will maintain dynamic standing balance during mobility activities with:: Independent with assistive device  ?  ?Problem: Sit to Stand ?Goal: LTG:  Patient will perform sit to stand with assistance level (PT) ?Description: LTG:  Patient will perform sit to stand with assistance level (PT) ?Flowsheets (Taken 03/15/2022 1750) ?LTG: PT will perform sit to stand in preparation for functional mobility with assistance level: Independent with assistive device ?  ?Problem: RH Bed Mobility ?Goal: LTG Patient will perform bed mobility with assist (PT) ?Description: LTG: Patient will perform bed mobility with assistance, with/without cues (PT). ?Flowsheets (Taken 03/15/2022 1750) ?LTG: Pt will perform bed mobility with assistance level of: Independent with assistive device  ?  ?Problem: RH Bed to Chair Transfers ?Goal: LTG Patient will perform bed/chair transfers w/assist (PT) ?Description: LTG: Patient will perform bed to chair transfers with assistance (PT). ?Flowsheets (Taken 03/15/2022 1750) ?LTG: Pt will perform Bed to Chair Transfers with assistance level: Independent with assistive device  ?  ?Problem: RH Car Transfers ?Goal: LTG Patient will perform car transfers with assist (PT) ?Description: LTG: Patient will perform car transfers with assistance (PT). ?Flowsheets (Taken 03/15/2022 1750) ?LTG: Pt will perform car transfers with assist:: Independent with assistive device  ?  ?Problem: RH Ambulation ?Goal: LTG Patient will ambulate in controlled environment (PT) ?Description: LTG: Patient will ambulate in a controlled environment, # of feet with assistance (PT). ?Flowsheets (Taken 03/15/2022 1750) ?LTG: Pt will ambulate in controlled environ  assist needed:: Independent with assistive  device ?LTG: Ambulation distance in controlled environment: 150 ft with LRAD ?Goal: LTG Patient will ambulate in home environment (PT) ?Description: LTG: Patient will ambulate in home environment, # of feet with assistance (PT). ?Flowsheets (Taken 03/15/2022 1750) ?LTG: Pt will ambulate in home environ  assist needed:: Independent with assistive device ?LTG: Ambulation distance in home environment: 75 ft with LRAD ?  ?Problem: RH Stairs ?Goal: LTG Patient will ambulate up and down stairs w/assist (PT) ?Description: LTG: Patient will ambulate up and down # of stairs with assistance (PT) ?Flowsheets (Taken 03/15/2022 1750) ?LTG: Pt will ambulate up/down stairs assist needed:: Supervision/Verbal cueing ?LTG: Pt will  ambulate up and down number of stairs: 3 stairs with L handrail ?  ?

## 2022-03-15 NOTE — Progress Notes (Signed)
Inpatient Rehabilitation Center ?Individual Statement of Services ? ?Patient Name:  Amanda Rich  ?Date:  03/15/2022 ? ?Welcome to the [facility].  Our goal is to provide you with an individualized program based on your diagnosis and situation, designed to meet your specific needs.  With this comprehensive rehabilitation program, you will be expected to participate in at least 3 hours of rehabilitation therapies Monday-Friday, with modified therapy programming on the weekends. ? ?Your rehabilitation program will include the following services:  Physical Therapy (PT), Occupational Therapy (OT), Speech Therapy (ST), 24 hour per day rehabilitation nursing, Therapeutic Recreaction (TR), Neuropsychology, Care Coordinator, Rehabilitation Medicine, Nutrition Services, Pharmacy Services, and Other ? ?Weekly team conferences will be held on Wednesdays to discuss your progress.  Your Inpatient Rehabilitation Care Coordinator will talk with you frequently to get your input and to update you on team discussions.  Team conferences with you and your family in attendance may also be held. ? ?Expected length of stay: 5-7 Days  Overall anticipated outcome:  MOD I ? ?Depending on your progress and recovery, your program may change. Your Inpatient Rehabilitation Care Coordinator will coordinate services and will keep you informed of any changes. Your Inpatient Rehabilitation Care Coordinator's name and contact numbers are listed  below. ? ?The following services may also be recommended but are not provided by the [facility]:  ? ?Home Health Rehabiltiation Services ?Outpatient Rehabilitation Services ?  ?Arrangements will be made to provide these services after discharge if needed.  Arrangements include referral to agencies that provide these services. ? ?Your insurance has been verified to be:   [place] Medicaid [employer] ?Your primary doctor is:  Suann Larry ? ?Pertinent information  will be shared with your doctor and your insurance company. ? ?Inpatient Rehabilitation Care Coordinator:  Erlene Quan, [place] or ([phone] ? ?Information discussed with and copy given to patient by: Dyanne Iha, 03/15/2022, 9:31 AM    ?

## 2022-03-15 NOTE — Progress Notes (Signed)
Physical Therapy Session Note ? ?Patient Details  ?Name: Amanda Rich ?MRN: [id number] ?Date of Birth: [birth year] ? ?Today's Date: 03/15/2022 ?PT Individual Time: 8366-2947 ?PT Individual Time Calculation (min): 72 min  ? ?Short Term Goals: ?Week 1:    ?No short term goals set ? ?Skilled Therapeutic Interventions/Progress Updates:  ?  Patient received sitting up in wc, agreeable to PT. She denies pain. PT transporting patient in wc to therapy gym for time management and energy conservation. She completed the berg balance scale and scored an 8/56 indicating a high risk for falling and benefit from use of AD. Patients poor proprioception in L LE, primarily the foot, is a major contributing factor to patients fall risk. Discussed with patient and she verbalized understanding. Patient then completing the TUG in 1'54" with RW and MinA. She required cuing partway through to remember how to complete the task demonstrating poor STM. Patients time to complete also indicates a high risk for falling. Patient preferentially weightbearing through L forefoot. No gastroc shortness or tone noted upon assessment. PT donning AFO to assist with increased proprioceptive input. Patient ambulating 132f with RW and CGA with patient consistently cuing herself "left, right, left, right." Patient often asking "which leg do I step with next?" Patient appeared to be easily internally and externally distracted throughout session. Frequently asking PT "are you watching me walk- do you see me walking?" Patient with poor engagement of L lateral hip stabilizers throughout stance phase, PT questioning motor planning/coordination vs actual strength deficit. Patient returning to room in wc, requesting to use bathroom. Ambulatory transfer to toilet with RW and MinA. Patient with continent void, supervision for perihygiene. Remaining up in wc, chair alarm on, call light within reach.  ? ?Therapy Documentation ?Precautions:  ?Precautions ?Precautions:  Fall ?Restrictions ?Weight Bearing Restrictions: No ? ? ? ?Therapy/Group: Individual Therapy ? ?JDebbora Dus?JDebbora Dus PT, DPT, CBIS ? ?03/15/2022, 7:59 AM  ?

## 2022-03-16 DIAGNOSIS — I639 Cerebral infarction, unspecified: Secondary | ICD-10-CM | POA: Diagnosis not present

## 2022-03-16 NOTE — Progress Notes (Signed)
Occupational Therapy Session Note ? ?Patient Details  ?Name: Amanda Rich ?MRN: [id number] ?Date of Birth: 02/05/1966 ?Today's Date: 03/16/2022 ?OT Individual Time: 6950-7225 ?OT Individual Time Calculation (min): 58 min  ? ? ? ?Short Term Goals: ?Week 1:  OT Short Term Goal 1 (Week 1): STG = LTG due to ELOS ? ?Skilled Therapeutic Interventions/Progress Updates:  ?Patient met semi-reclined in bed at conclusion of breakfast meal in agreement with OT treatment session. Mild pain and bruising reported at R groin from surgical procedure. Supine to EOB with supervision A. Patient reliant on BUE support to advance LLE from bed surface to EOB. Sit to stand from EOB with CGA. Patient completed ambulatory toilet transfer and 3/3 parts of toileting task with CGA. Walk-in shower transfer and UB/LB bathing/dressing with CGA and lateral leans. Cues for incorporation of LUE throughout. Patient then completed grooming standing at sink level with cues for upright posture and walker management. Session concluded with patient seated in wc with call bell within reach, chair alarm activated and all needs met.  ? ?Therapy Documentation ?Precautions:  ?Precautions ?Precautions: Fall ?Restrictions ?Weight Bearing Restrictions: Yes ?General: ?  ? ?Therapy/Group: Individual Therapy ? ?Sotero Brinkmeyer R Howerton-Davis ?03/16/2022, 6:50 AM ?

## 2022-03-16 NOTE — Progress Notes (Signed)
Physical Therapy Session Note ? ?Patient Details  ?Name: Amanda Rich ?MRN: [id number] ?Date of Birth: 11-10-1966 ? ?Today's Date: 03/16/2022 ?PT Individual Time: 1402-1500 ?PT Individual Time Calculation (min): 58 min  ? ?Short Term Goals: ?Week 1:  PT Short Term Goal 1 (Week 1): = LTG d/t ELOS ? ?Skilled Therapeutic Interventions/Progress Updates:  ? ?Pt received sitting in WC and agreeable to PT. Pt transported to day room in [facility]. Gait training with RW 2 x 52f with min assist and moderate cues for gait pattern, improved step height on the LLE, and intermittenty cues for improved terminal knee extension on the LLE.  ? ?WC mobility x 154fwith supervision assist and BUE propulsion with cues for doorway management.  ? ?Dynamic balance training to perform lateral and cross body reaches to place and remove clothes pins from basketball net and place and horizontal bar. Cues for terminal knee exension on the LLE in stance and increased weight shift R and L with lateral reaches.  ?Stepping to 2 inch step with BUE supported on RW x 10 BLE. Cues for improved posture and activation of flexion synergy to incorporate ankle DF with hip/knee flexion. Noted to have improved DF activation on the last 5 repetitions.  ? ?Pt reports need for urination. Stand pivot transfer to toilet with RW with min assist for safety. Able to complete clothing management and pericare with supervision assist for safety. [facility] reach and all needs met.   ? ? ?   ? ?Therapy Documentation ?Precautions:  ?Precautions ?Precautions: Fall ?Restrictions ?Weight Bearing Restrictions: Yes ? ?Pain: ?Pain Assessment ?Pain Scale: 0-10 ?Pain Score: 0-No pain ? ? ? ? ?Therapy/Group: Individual Therapy ? ?AuLorie Phenix3/23/2023, 3:14 PM  ?

## 2022-03-16 NOTE — Progress Notes (Signed)
?                                                       PROGRESS NOTE ? ? ?Subjective/Complaints: ?No new complaints this morning ?She is eager to be home by April- send message to team to start discharge planning ?Did well with therapy today! ? ?ROS: Decreased sensation left leg, denies pain ? ? ?Objective: ?  ?No results found. ?Recent Labs  ?  03/15/22 ?0530  ?WBC 7.8  ?HGB 10.7*  ?HCT 31.9*  ?PLT 246  ? ?Recent Labs  ?  03/15/22 ?0530  ?NA 138  ?K 3.6  ?CL 102  ?CO2 26  ?GLUCOSE 86  ?BUN 11  ?CREATININE 0.56  ?CALCIUM 9.0  ? ? ?Intake/Output Summary (Last 24 hours) at 03/16/2022 1337 ?Last data filed at 03/16/2022 1336 ?Gross per 24 hour  ?Intake 1270 ml  ?Output --  ?Net 1270 ml  ?  ? ?  ? ?Physical Exam: ?Vital Signs ?Blood pressure 122/82, pulse 62, temperature 98.3 ?F (36.8 ?C), resp. rate 14, height '5\' 5"'$  (1.651 m), weight 56.5 kg, last menstrual period 10/15/2017, SpO2 100 %. ?Gen: no distress, normal appearing, BMI 20.73 ?HEENT: oral mucosa pink and moist, NCAT ?Cardio: Reg rate ?Chest: normal effort, normal rate of breathing ?Abd: soft, non-distended ?Ext: no edema ?Psych: pleasant, normal affect ?Skin: intact ?Neurological:  ?   Mental Status: She is alert.  ?   Comments: Alert and oriented x 3. Normal insight and awareness. Intact Memory. Normal language and speech. Cranial nerve exam unremarkable except for mild left central 7 and left hemifacial sensory deficits. LUE and LLE 1/2 for LT and pain. RUE and RLE notable for stocking glove sensory loss in fingers/below ankle respectively.  Motor 5/5 RUE and RLE. LUE 3+ to 4/5 prox to distal. LLE 4- to 4/5 prox to distal with decreased [place] and position sense. No frank ataxia. No abnl resting tone.   ?Psychiatric:     ?   Mood and Affect: Mood normal.     ?   Behavior: Behavior normal.     ?   Thought Content: Thought content normal.     ?   Judgment: Judgment normal.  ? ? ?Assessment/Plan: ?1. Functional deficits which require 3+ hours per day of  interdisciplinary therapy in a comprehensive inpatient rehab setting. ?Physiatrist is providing close team supervision and 24 hour management of active medical problems listed below. ?Physiatrist and rehab team continue to assess barriers to discharge/monitor patient progress toward functional and medical goals ? ?Care Tool: ? ?Bathing ?   ?Body parts bathed by patient: Right arm, Left arm, Chest, Abdomen, Right upper leg, Left upper leg, Right lower leg, Left lower leg, Face  ?   ?  ?  ?Bathing assist Assist Level: Minimal Assistance - Patient > 75% ?  ?  ?Upper Body Dressing/Undressing ?Upper body dressing Upper body dressing/undressing activity did not occur (including orthotics): Safety/medical concerns ?What is the patient wearing?: Bra, Pull over shirt ?   ?Upper body assist Assist Level: Supervision/Verbal cueing ?   ?Lower Body Dressing/Undressing ?Lower body dressing ? ? ? Lower body dressing activity did not occur: Safety/medical concerns ?What is the patient wearing?: Underwear/pull up, Pants ? ?  ? ?Lower body assist Assist for lower body dressing: Minimal Assistance - Patient > 75% ?   ? ?  Toileting ?Toileting    ?Toileting assist Assist for toileting: Minimal Assistance - Patient > 75% ?  ?  ?Transfers ?Chair/bed transfer ? ?Transfers assist ? Chair/bed transfer activity did not occur: Safety/medical concerns ? ?Chair/bed transfer assist level: Contact Guard/Touching assist ?  ?  ?Locomotion ?Ambulation ? ? ?Ambulation assist ? ? Ambulation activity did not occur: Safety/medical concerns ? ?Assist level: Minimal Assistance - Patient > 75% ?Assistive device: Walker-rolling ?Max distance: 143f  ? ?Walk 10 feet activity ? ? ?Assist ?   ? ?Assist level: Minimal Assistance - Patient > 75% ?Assistive device: Walker-rolling  ? ?Walk 50 feet activity ? ? ?Assist   ? ?Assist level: Minimal Assistance - Patient > 75% ?Assistive device: Walker-rolling  ? ? ?Walk 150 feet activity ? ? ?Assist Walk 150 feet  activity did not occur: Safety/medical concerns ? ?  ?Assistive device: Walker-rolling ?  ? ?Walk 10 feet on uneven surface  ?activity ? ? ?Assist Walk 10 feet on uneven surfaces activity did not occur: Safety/medical concerns ? ? ?  ?   ? ?Wheelchair ? ? ? ? ?Assist Is the patient using a wheelchair?: Yes ?Type of Wheelchair: Manual ?  ? ?Wheelchair assist level: Supervision/Verbal cueing ?Max wheelchair distance: 10 ft  ? ? ?Wheelchair 50 feet with 2 turns activity ? ? ? ?Assist ? ?  ?Wheelchair 50 feet with 2 turns activity did not occur: N/A ? ? ?Assist Level: Total Assistance - Patient < 25%  ? ?Wheelchair 150 feet activity  ? ? ? ?Assist ?   ? ? ?Assist Level: Total Assistance - Patient < 25%  ? ?Blood pressure 122/82, pulse 62, temperature 98.3 ?F (36.8 ?C), resp. rate 14, height '5\' 5"'$  (1.651 m), weight 56.5 kg, last menstrual period 10/15/2017, SpO2 100 %. ? ? ? Medical Problem List and Plan: ?1. 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   ?-Patient had been pretreated for contrast allergy with prednisone and taper as directed. ?            -patient may shower ?            -ELOS/Goals: 5-7 days, mod I goals with PT and OT ? Messaged LLattie Hawto schedule HFU ?2.  Antithrombotics: ?-DVT/anticoagulation:  Mechanical: Antiembolism stockings, thigh (TED hose) Bilateral lower extremities ?            -antiplatelet therapy: Aspirin 81 mg daily, Brilinta 90 mg twice daily ?3. 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 Consider Qutenza ?4. Mood: Celexa 10 mg nightly ?            -antipsychotic agents: N/A ?5. Neuropsych: This patient is capable of making decisions on her own behalf. ?6. Skin/Wound Care: Routine skin checks ?7. Fluids/Electrolytes/Nutrition: Routine in and outs with follow-up chemistries on admit. ?8.   History of right breast cancer status postlumpectomy chemo/radiation.   ?-Follow-up outpatient Dr.Kinard as well as Dr. VMickeal Skinner   ?-Continue ARIMIDEX ?-no blood draws RUE ?9.  Asthma/tobacco use.  Continue inhalers as directed. ?10. Hypoalbuminemia: recommended choosing protein rich foods, discussed nuts are a great source to include daily.  ?11. Hyperglycemia: discussed role of sugar and stress in elevating CBGs, discussed that they have greatly improved, recommended avoiding added sugar in diet ?12. Suboptimal potassium: supplement 456m klor 3/23. Recommended adding nuts to daily diet.  ? ?LOS: ?2  days ?A FACE TO FACE EVALUATION WAS PERFORMED ? ?Martha Clan P Kweku Stankey ?03/16/2022, 1:37 PM  ? ?  ?

## 2022-03-16 NOTE — Progress Notes (Signed)
Physical Therapy Session Note ? ?Patient Details  ?Name: Amanda Rich ?MRN: [id number] ?Date of Birth: 03/28/1966 ? ?Today's Date: 03/16/2022 ?PT Individual Time: 0109-3235 ?PT Individual Time Calculation (min): 55 min  ? ?Short Term Goals: ?Week 1:  PT Short Term Goal 1 (Week 1): = LTG d/t ELOS ? ?Skilled Therapeutic Interventions/Progress Updates:  ? Received pt sitting in [facility] ready for therapy. Pt agreeable to PT treatment and reported soreness in L groin but did not state pain level. Session with emphasis on functional mobility/transfers, generalized strengthening and endurance, dynamic standing balance/coordination, NMR, and gait training. Sit<>stand with RW and CGA and pt ambulated 166f with RW and CGA/light min A to dayroom with significantly increased time without AFO - cues to strike with L heel, weight shift onto LLE and step RLE through for improved stride. Pt hyperfocused on gait pattern asking therapist multiple times if she was doing it right and repeating cues out loud. Pt performed BUE/LE strengthening on Nustep at workload 5 decreasing to workload 3 for 8 minutes for a total of 90 steps with emphasis on cardiovascular endurance, reciprocal movement training, and functional use of LUE/LE. Tried AFO in slip on shoe but due to shoe design, AFO would not fit - encouraged pt to wear lace up tennis shoes from now on. Pt ambulated additional 162fwith RW and CGA to mat then performed x10 mini squats with mild UE support and light min A for balance - pt with difficulty weight shifting to LLE and appeared to have difficulty with motor planning/sequencing of task. Stand<>pivot mat<>WC with RW and CGA and pt transported back to room in WC dependently. Demonstrated technique for propelling WC using BLE to activate hamstrings for pt to work on in between therapies. Pt also requesting grounds pass - notified Dr. RaRanell PatrickConcluded session with pt sitting in WC, needs within reach, and chair pad alarm on.   ? ?Therapy Documentation ?Precautions:  ?Precautions ?Precautions: Fall ?Restrictions ?Weight Bearing Restrictions: Yes ? ?Therapy/Group: Individual Therapy ?AnBlenda NicelyAnBecky SaxT, DPT  ?03/16/2022, 7:12 AM  ?

## 2022-03-17 ENCOUNTER — Other Ambulatory Visit (HOSPITAL_COMMUNITY): Payer: Self-pay

## 2022-03-17 DIAGNOSIS — I639 Cerebral infarction, unspecified: Secondary | ICD-10-CM | POA: Diagnosis not present

## 2022-03-17 NOTE — Evaluation (Signed)
Speech Language Pathology Assessment and Plan ? ?Patient Details  ?Name: Amanda Rich ?MRN: [id number] ?Date of Birth: 1966/09/06 ? ?SLP Diagnosis: Dysarthria;Cognitive Impairments  ?Rehab Potential: Excellent ?ELOS: 5-7 days  ? ?Today's Date: 03/17/2022 ?SLP Individual Time: 5093-2671 ?SLP Individual Time Calculation (min): 50 min ? ?Hospital Problem: Principal Problem: ?  Subcortical infarction [facility]) ?Active Problems: ?  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 ?Past Medical History:  ?Past Medical History:  ?Diagnosis Date  ? Anemia   ? Asthma   ? Cancer [facility])   ? breast cancer  ? Depression   ? Dyspnea   ? Family history of breast cancer   ? Family history of liver cancer   ? History of radiation therapy 03/10/21-04/11/21  ? Right breast- Dr. Gery Pray  ? Pericarditis   ? Smoker   ? Umbilical hernia   ? ?Past Surgical History:  ?Past Surgical History:  ?Procedure Laterality Date  ? ABLATION ON ENDOMETRIOSIS    ? BREAST BIOPSY Right   ? BREAST LUMPECTOMY WITH RADIOACTIVE SEED AND SENTINEL LYMPH NODE BIOPSY Right 02/02/2021  ? Procedure: RIGHT BREAST LUMPECTOMY WITH RADIOACTIVE SEED AND RIGHT SENTINEL LYMPH NODE MAPPING;  Surgeon: [name], MD;  Location: [place];  Service: General;  Laterality: Right;  ? GANGLION CYST EXCISION  12/25/2005  ? L wrist; [facility], [place]  ? IR ANGIO INTRA EXTRACRAN SEL INTERNAL CAROTID BILAT MOD SED  02/06/2022  ? IR ANGIO INTRA EXTRACRAN SEL INTERNAL CAROTID UNI R MOD SED  03/10/2022  ? IR ANGIO VERTEBRAL SEL VERTEBRAL BILAT MOD SED  02/06/2022  ? IR ANGIOGRAM FOLLOW UP STUDY  03/14/2022  ? IR IMAGING GUIDED PORT INSERTION  09/10/2020  ? IR NEURO EACH ADD'L AFTER BASIC UNI RIGHT (MS)  03/14/2022  ? IR TRANSCATH/EMBOLIZ  03/10/2022  ? RADIOLOGY WITH ANESTHESIA N/A 03/10/2022  ? Procedure: Pipeline Embolization of Right Internal Carotid Artery Aneurysm;  Surgeon: Consuella Lose, MD;  Location: [place];  Service: Radiology;  Laterality:  N/A;  ? TUBAL LIGATION    ? [facility]  ? UMBILICAL HERNIA REPAIR  12/26/2003  ? [place]  ? ? ?Assessment / Plan / Recommendation ?Clinical Impression  Amanda Rich is a 56 year old right-handed female with history of right breast cancer about 1-1/2 years ago undergoing neoadjuvant chemotherapy which included Taxol.  Patient subsequently underwent lumpectomy with right side axillary lymph node dissection, she did undergo subsequent adjuvant radiation treatments. 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 Kathyrn Sheriff. Patient transferred to CIR on 03/14/2022 ? ?Pt participating in SLUMS assessment with a score of 29/30, only error was inability to recall 1 of 5 words with delay, able to recall with simple verbal cue. Pt is self reporting changes in speech, mostly halting and word finding difficulty. She also states it seems as though the words in her sentences are "out of order" and she thinks she "sounds like a preschooler". This causes pt anxiety and makes her uncomfortable communicating at times. Pt with no significant difficulty communicating complex wants/needs at conversation level, occ high level word finding difficulty noted. Pt also reports difficulty with maintaining attention to conversation/topic at times, this worsens as the day goes on and pt fagitues. Pt demonstrates relatively intact cognition in all other areas, can discuss meds, insurance, money management, etc with precision. Pt will benefit from skilled ST during CIR stay to treat reported deficits in language and attention  and train in effective compensatory strategies. ?  ?Skilled Therapeutic Interventions          Pt participating in [place] Mental Status Examination (SLUMS) as well as further non-standardized assessments of speech, language and cognition. Please see above. ?  ?SLP Assessment ? Patient will need skilled Speech  [place] [facility] during CIR admission  ?  ?Recommendations ? Patient destination: Home ?Follow up Recommendations: None ?Equipment Recommended: None recommended by SLP  ?  ?SLP Frequency 1 to 3 out of 7 days   ?SLP Duration ? ?SLP Intensity ? ?SLP Treatment/Interventions 5-7 days ? ?Minumum of 1-2 x/day, 30 to 90 minutes ? ?Cognitive remediation/compensation;Speech/Language facilitation;Therapeutic Activities;Patient/family education   ? ?Pain ?Pain Assessment ?Pain Scale: 0-10 ?Pain Score: 0-No pain ? ?Prior Functioning ?Cognitive/Linguistic Baseline: Within functional limits ?Type of Home: House ? Lives With: Alone ?Available Help at Discharge: Family;Available PRN/intermittently ?Education: associates degree ?Vocation: Unemployed ? ?SLP Evaluation ?Cognition ?Overall Cognitive Status: Within Functional Limits for tasks assessed ?Arousal/Alertness: Awake/alert ?Orientation Level: Oriented X4 ?Year: 2023 ?Month: March ?Day of Week: Correct ?Attention: Focused;Sustained ?Focused Attention: Appears intact ?Sustained Attention: Appears intact ?Memory: Impaired ?Memory Impairment: Decreased short term memory ?Decreased Short Term Memory: Verbal complex ?Awareness: Appears intact ?Problem Solving: Appears intact ?Safety/Judgment: Appears intact  ?Comprehension ?Auditory Comprehension ?Overall Auditory Comprehension: Appears within functional limits for tasks assessed ?Yes/No Questions: Within Functional Limits ?Commands: Within Functional Limits ?Conversation: Complex ?Expression ?Expression ?Primary Mode of Expression: Verbal ?Verbal Expression ?Overall Verbal Expression: Other (comment) (pt reports change in expressive language/fluency) ?Initiation: No impairment ?Level of Generative/Spontaneous Verbalization: Conversation ?Repetition: No impairment ?Naming: No impairment ?Pragmatics: No impairment ?Written Expression ?Dominant Hand: Right ?Written Expression: Within Functional Limits ?Oral Motor ?Oral  Motor/Sensory Function ?Overall Oral Motor/Sensory Function: Within functional limits ?Motor Speech ?Overall Motor Speech: Appears within functional limits for tasks assessed ? ?Care Tool ?Care Tool Cognition ?Ability to hear (with hearing aid or hearing appliances if normally used Ability to hear (with hearing aid or hearing appliances if normally used): 0. Adequate - no difficulty in normal conservation, social interaction, listening to TV ?  ?Expression of Ideas and Wants Expression of Ideas and Wants: 4. Without difficulty (complex and basic) - expresses complex messages without difficulty and with speech that is clear and easy to understand ?  ?Understanding Verbal and Non-Verbal Content Understanding Verbal and Non-Verbal Content: 4. Understands (complex and basic) - clear comprehension without cues or repetitions  ?Memory/Recall Ability Memory/Recall Ability : Current season;Staff names and faces;That he or she is in a hospital/hospital unit  ? ?Short Term Goals: ?Week 1: SLP Short Term Goal 1 (Week 1): STG = LTG d/t ELOS ? ?Refer to Care Plan for Long Term Goals ? ?Recommendations for other services: None  ? ?Discharge Criteria: Patient will be discharged from SLP if patient refuses treatment 3 consecutive times without medical reason, if treatment goals not met, if there is a change in medical status, if patient makes no progress towards goals or if patient is discharged from hospital. ? ?The above assessment, treatment plan, treatment alternatives and goals were discussed and mutually agreed upon: by patient ? ?Dewaine Conger ?03/17/2022, 12:19 PM ? ? ?

## 2022-03-17 NOTE — Progress Notes (Signed)
?                                                       PROGRESS NOTE ? ? ?Subjective/Complaints: ?No new complaints this morning ?Discussed her neuropathy, fatigue with gabapentin, Qutenza as an option outpatient and she would like to try ? ?ROS: Decreased sensation left leg, +bilateral lower extremity neuropathic pain ? ? ?Objective: ?  ?No results found. ?Recent Labs  ?  03/15/22 ?0530  ?WBC 7.8  ?HGB 10.7*  ?HCT 31.9*  ?PLT 246  ? ?Recent Labs  ?  03/15/22 ?0530  ?NA 138  ?K 3.6  ?CL 102  ?CO2 26  ?GLUCOSE 86  ?BUN 11  ?CREATININE 0.56  ?CALCIUM 9.0  ? ? ?Intake/Output Summary (Last 24 hours) at 03/17/2022 1330 ?Last data filed at 03/17/2022 1247 ?Gross per 24 hour  ?Intake 1180 ml  ?Output --  ?Net 1180 ml  ?  ? ?  ? ?Physical Exam: ?Vital Signs ?Blood pressure 110/73, pulse 62, temperature 98.2 ?F (36.8 ?C), resp. rate 15, height '5\' 5"'$  (1.651 m), weight 56.5 kg, last menstrual period 10/15/2017, SpO2 100 %. ?Gen: no distress, normal appearing, BMI 20.73 ?HEENT: oral mucosa pink and moist, NCAT ?Cardio: Reg rate ?Chest: normal effort, normal rate of breathing ?Abd: soft, non-distended ?Ext: no edema ?Psych: pleasant, normal affect ?Skin: left anterior chest port site well healed ?Neurological:  ?   Mental Status: She is alert.  ?   Comments: Alert and oriented x 3. Normal insight and awareness. Intact Memory. Normal language and speech. Cranial nerve exam unremarkable except for mild left central 7 and left hemifacial sensory deficits. LUE and LLE 1/2 for LT and pain. RUE and RLE notable for stocking glove sensory loss in fingers/below ankle respectively.  Motor 5/5 RUE and RLE. LUE 3+ to 4/5 prox to distal. LLE 4- to 4/5 prox to distal with decreased [place] and position sense. No frank ataxia. No abnl resting tone.   ?Psychiatric:     ?   Mood and Affect: Mood normal.     ?   Behavior: Behavior normal.     ?   Thought Content: Thought content normal.     ?   Judgment: Judgment normal.  ? ? ?Assessment/Plan: ?1.  Functional deficits which require 3+ hours per day of interdisciplinary therapy in a comprehensive inpatient rehab setting. ?Physiatrist is providing close team supervision and 24 hour management of active medical problems listed below. ?Physiatrist and rehab team continue to assess barriers to discharge/monitor patient progress toward functional and medical goals ? ?Care Tool: ? ?Bathing ?   ?Body parts bathed by patient: Right arm, Left arm, Chest, Abdomen, Right upper leg, Left upper leg, Right lower leg, Left lower leg, Face  ?   ?  ?  ?Bathing assist Assist Level: Minimal Assistance - Patient > 75% ?  ?  ?Upper Body Dressing/Undressing ?Upper body dressing   ?What is the patient wearing?: Bra, Pull over shirt ?   ?Upper body assist Assist Level: Supervision/Verbal cueing ?   ?Lower Body Dressing/Undressing ?Lower body dressing ? ? ?   ?What is the patient wearing?: Underwear/pull up, Pants ? ?  ? ?Lower body assist Assist for lower body dressing: Minimal Assistance - Patient > 75% ?   ? ?Toileting ?Toileting    ?Toileting assist  Assist for toileting: Minimal Assistance - Patient > 75% ?  ?  ?Transfers ?Chair/bed transfer ? ?Transfers assist ?   ? ?Chair/bed transfer assist level: Contact Guard/Touching assist ?  ?  ?Locomotion ?Ambulation ? ? ?Ambulation assist ? ?   ? ?Assist level: Minimal Assistance - Patient > 75% ?Assistive device: Walker-rolling ?Max distance: 171f  ? ?Walk 10 feet activity ? ? ?Assist ?   ? ?Assist level: Minimal Assistance - Patient > 75% ?Assistive device: Walker-rolling  ? ?Walk 50 feet activity ? ? ?Assist   ? ?Assist level: Minimal Assistance - Patient > 75% ?Assistive device: Walker-rolling  ? ? ?Walk 150 feet activity ? ? ?Assist Walk 150 feet activity did not occur: Safety/medical concerns ? ?  ?Assistive device: Walker-rolling ?  ? ?Walk 10 feet on uneven surface  ?activity ? ? ?Assist Walk 10 feet on uneven surfaces activity did not occur: Safety/medical concerns ? ? ?  ?    ? ?Wheelchair ? ? ? ? ?Assist Is the patient using a wheelchair?: Yes ?Type of Wheelchair: Manual ?  ? ?Wheelchair assist level: Supervision/Verbal cueing ?Max wheelchair distance: 10 ft  ? ? ?Wheelchair 50 feet with 2 turns activity ? ? ? ?Assist ? ?  ?Wheelchair 50 feet with 2 turns activity did not occur: N/A ? ? ?Assist Level: Total Assistance - Patient < 25%  ? ?Wheelchair 150 feet activity  ? ? ? ?Assist ?   ? ? ?Assist Level: Total Assistance - Patient < 25%  ? ?Blood pressure 110/73, pulse 62, temperature 98.2 ?F (36.8 ?C), resp. rate 15, height '5\' 5"'$  (1.651 m), weight 56.5 kg, last menstrual period 10/15/2017, SpO2 100 %. ? ? ? Medical Problem List and Plan: ?1. 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   ?-Patient had been pretreated for contrast allergy with prednisone and taper as directed. ?            -patient may shower ?            -ELOS/Goals: 5-7 days, mod I goals with PT and OT ? Messaged LLattie Hawto schedule HFU ? Discussed diet to help prevent future strokes.  ?2.  Antithrombotics: ?-DVT/anticoagulation:  Mechanical: Antiembolism stockings, thigh (TED hose) Bilateral lower extremities ?            -antiplatelet therapy: Aspirin 81 mg daily, Brilinta 90 mg twice daily ?3. 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 Discussed Qutenza and she wold like to try outpatient ?4. Depression: Celexa 10 mg nightly. Discussed amitriptyline but she felt too sleepy with this in the past.  ?            -antipsychotic agents: N/A ?5. Neuropsych: This patient is capable of making decisions on her own behalf. ?6. Skin/Wound Care: Routine skin checks ?7. Fluids/Electrolytes/Nutrition: Routine in and outs with follow-up chemistries on admit. ?8.  History of right breast cancer status postlumpectomy chemo/radiation.    ?-Follow-up outpatient Dr.Kinard as well as Dr. VMickeal Skinner   ?-Continue ARIMIDEX ?-no blood draws RUE ?9.  Asthma/tobacco use.  Continue inhalers as directed. ?10. Hypoalbuminemia: recommended choosing protein rich foods, discussed nuts are a great source to include daily.  ?11. Hyperglycemia: discussed role of sugar and stress in elevating CBGs, discussed that they have greatly improved, recommended avoiding added sugar in diet ?12. Suboptimal potassium: supplement 467m klor  3/23. Recommended adding nuts to daily diet.  ? ?LOS: ?3 days ?A FACE TO FACE EVALUATION WAS PERFORMED ? ?Amanda Rich ?03/17/2022, 1:30 PM  ? ?  ?

## 2022-03-17 NOTE — Progress Notes (Signed)
Per patient statement, confirmed left anterior chest port removed on or abt Jan. 3rd 2023. Site healed. LDA removed from chart per protocol.  ?

## 2022-03-17 NOTE — Plan of Care (Signed)
?  Problem: RH Expression Communication ?Goal: LTG Patient will verbally express basic/complex needs(SLP) ?Description: LTG:  Patient will verbally express basic/complex needs, wants or ideas with cues  (SLP) ?Flowsheets (Taken 03/17/2022 1228) ?LTG: Patient will verbally express basic/complex needs, wants or ideas (SLP): Modified Independent ?Note: Complex needs in complex conversation ?  ?Problem: RH Attention ?Goal: LTG Patient will demonstrate this level of attention during functional activites (SLP) ?Description: LTG:  Patient will will demonstrate this level of attention during functional activites (SLP) ?Flowsheets (Taken 03/17/2022 1228) ?Patient will demonstrate during cognitive/linguistic activities the attention type of: ? Divided ? Alternating ?Patient will demonstrate this level of attention during cognitive/linguistic activities in: Home ?LTG: Patient will demonstrate this level of attention during cognitive/linguistic activities with assistance of (SLP): Modified Independent ?  ?

## 2022-03-17 NOTE — Progress Notes (Signed)
Occupational Therapy Session Note ? ?Patient Details  ?Name: Amanda Rich ?MRN: [id number] ?Date of Birth: [birth year] ? ?Today's Date: 03/17/2022 ?OT Individual Time: 1771-1657 ?OT Individual Time Calculation (min): 29 min  ? ? ?Short Term Goals: ?Week 1:  OT Short Term Goal 1 (Week 1): STG = LTG due to ELOS ? ?Skilled Therapeutic Interventions/Progress Updates:  ?Skilled OT intervention completed with focus on ADL retraining, and LUE coordination. Pt received in bathroom with NT present supervising pt with therapist taking over at pt care. Pt was continent of bladder. Completed toileting with supervision, sit > stand with RW with supervision then ambulatory transfer to w/c in room with CGA. Pt requesting to put on new clothes with pt able to donn UB with set up A, and LB with supervision for threading and CGA at the sit > stand level. Cues provided and needed for efficiency, with pt using left hand during donning of pants after cues. MD in room for rounds. To address fine motor coordination, pt completed nine hole peg test with the following time results: ? ?On R hand: 21 sec ?On L hand: 32 sec ? ?Pt verbalized and also demonstrated challenge with alternating attention/concentration when noises or movements occurred during the assessment with education provided to pt about how the brain heals, and how to eliminate distractions when higher level concentration is needed during functional tasks for safety and efficiency reasons. Pt was left seated in w/c, with chair alarm on and all needs in reach at end of session. ? ?Therapy Documentation ?Precautions:  ?Precautions ?Precautions: Fall ?Restrictions ?Weight Bearing Restrictions: No ? ?Pain: ?No c/o pain ? ? ?Therapy/Group: Individual Therapy ? ?Eddrick Dilone E Marcus Groll ?03/17/2022, 7:49 AM ?

## 2022-03-17 NOTE — IPOC Note (Signed)
Overall Plan of Care (IPOC) ?Patient Details ?Name: Amanda Rich ?MRN: [id number] ?DOB: 09-26-1966 ? ?Admitting Diagnosis: Subcortical infarction ([place]) ? ?Hospital Problems: Principal Problem: ?  Subcortical infarction [facility]) ?Active Problems: ?  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 ? ? ? Functional Problem List: ?Nursing Bowel, Pain, Endurance, Medication Management, Safety  ?PT Balance, Endurance, Motor, Sensory, Safety  ?OT Balance, Motor, Endurance, Safety, Pain  ?SLP    ?TR    ?    ? Basic ADL?s: ?OT Eating, Grooming, Bathing, Dressing, Toileting  ? ?  Advanced  ADL?s: ?OT Simple Meal Preparation  ?   ?Transfers: ?PT Bed Mobility, Bed to Chair, Car, Furniture, Floor  ?OT Toilet, Tub/Shower  ? ?  Locomotion: ?PT Ambulation, Stairs, Wheelchair Mobility  ? ?  Additional Impairments: ?OT Fuctional Use of Upper Extremity  ?SLP   ?  ?   ?TR    ? ? ?Anticipated Outcomes ?Item Anticipated Outcome  ?Self Feeding Indep  ?Swallowing ?   ?  ?Basic self-care ? Mod I  ?Toileting ? Mod I ?  ?Bathroom Transfers Mod I  ?Bowel/Bladder ? manage bowel w mod I assist  ?Transfers ? mod I with LRAD  ?Locomotion ? Mod I at ambulatory level with LRAD; Supervision stairs  ?Communication ?    ?Cognition ?    ?Pain ? pain at or below klevel 4 with prns  ?Safety/Judgment ? maintain w cues  ? ?Therapy Plan: ?PT Intensity: Minimum of 1-2 x/day ,45 to 90 minutes ?PT Frequency: 5 out of 7 days ?PT Duration Estimated Length of Stay: 7-10 days ?OT Intensity: Minimum of 1-2 x/day, 45 to 90 minutes ?OT Frequency: 5 out of 7 days ?OT Duration/Estimated Length of Stay: 5-7 days ?   ? ?Due to the current state of emergency, patients may not be receiving their 3-hours of Medicare-mandated therapy. ? ? Team Interventions: ?Nursing Interventions Disease Management/Prevention, Medication Management, Pain Management, Bowel Management, Patient/Family Education  ?PT interventions Ambulation/gait training, Human resources officer, Community reintegration, Discharge planning, Patient/family education, Stair training, Therapeutic Activities, Therapeutic Exercise, Cognitive remediation/compensation, DME/adaptive equipment instruction, Functional mobility training, Neuromuscular re-education, Pain management, Splinting/orthotics, UE/LE Strength taining/ROM, UE/LE Coordination activities  ?OT Interventions Balance/vestibular training, Discharge planning, Pain management, Self Care/advanced ADL retraining, Therapeutic Activities, UE/LE Coordination activities, Functional mobility training, Patient/family education, Therapeutic Exercise, UE/LE Strength taining/ROM, Psychosocial support, DME/adaptive equipment instruction, Community reintegration  ?SLP Interventions    ?TR Interventions    ?SW/CM Interventions Discharge Planning, Psychosocial Support, Disease Management/Prevention, Patient/Family Education  ? ?Barriers to Discharge ?MD  Medical stability  ?Nursing Decreased caregiver support, Home environment access/layout ?1 level 3 ste solo, fiance' in and out  ?PT Decreased caregiver support, Home environment access/layout ?   ?OT Inaccessible home environment, Lack of/limited family support ?   ?SLP   ?   ?SW Lack of/limited family support, Insurance for SNF coverage ?   ? ?Team Discharge Planning: ?Destination: PT-Home ,OT- Home , SLP-  ?Projected Follow-up: PT-Home health PT, OT-  Other (comment), Outpatient OT (intermittent supervision), SLP-  ?Projected Equipment Needs: PT-Rolling walker with 5" wheels, OT- Tub/shower bench, To be determined, SLP-  ?Equipment Details: PT- , OT-  ?Patient/family involved in discharge planning: PT- Patient,  OT-Patient, SLP-  ? ?MD ELOS: 5-7 days ?Medical Rehab Prognosis:  Excellent ?Assessment: The patient has been admitted for CIR therapies with the diagnosis of right subcortical infarction. The team will be addressing functional mobility, strength, stamina, balance, safety, adaptive techniques and  equipment, self-care,  bowel and bladder mgt, patient and caregiver education. Goals have been set at [place]. Anticipated discharge destination is home. ? ? ?See Team Conference Notes for weekly updates to the plan of care  ?

## 2022-03-17 NOTE — TOC Benefit Eligibility Note (Signed)
Patient Advocate Encounter ? ?Insurance verification completed.   ? ?The patient is currently admitted and upon discharge could be taking Brilinta 90 mg. ? ?The current 30 day co-pay is, $0.00.  ? ?The patient is insured through [employer] [place] Medicaid  ? ? ? ?Lyndel Safe, CPhT ?Pharmacy Patient Advocate Specialist ?[place] Patient Advocate Team ?Direct Number: [phone]  Fax: [phone] ? ? ? ? ? ?  ?

## 2022-03-17 NOTE — Plan of Care (Signed)
?  Problem: Consults ?Goal: RH STROKE PATIENT EDUCATION ?Description: See Patient Education module for education specifics  ?Outcome: Progressing ?  ?Problem: RH BOWEL ELIMINATION ?Goal: RH STG MANAGE BOWEL WITH ASSISTANCE ?Description: STG Manage Bowel with mod I  Assistance. ?Outcome: Progressing ?Goal: RH STG MANAGE BOWEL W/MEDICATION W/ASSISTANCE ?Description: STG Manage Bowel with Medication with mod I Assistance. ?Outcome: Progressing ?  ?Problem: RH SAFETY ?Goal: RH STG ADHERE TO SAFETY PRECAUTIONS W/ASSISTANCE/DEVICE ?Description: STG Adhere to Safety Precautions With cues Assistance/Device. ?Outcome: Progressing ?  ?Problem: RH PAIN MANAGEMENT ?Goal: RH STG PAIN MANAGED AT OR BELOW PT'S PAIN GOAL ?Description: At or below level 4 with prns ?Outcome: Progressing ?  ?Problem: RH KNOWLEDGE DEFICIT ?Goal: RH STG INCREASE KNOWLEDGE OF STROKE PROPHYLAXIS ?Description: Patient will be able to manage secondary stroke risks with medications and dietary modifications using handouts and educational resources independently ?Outcome: Progressing ?  ?

## 2022-03-17 NOTE — Progress Notes (Signed)
Physical Therapy Session Note ? ?Patient Details  ?Name: Amanda Rich ?MRN: [id number] ?Date of Birth: [birth year] ? ?Today's Date: 03/17/2022 ?PT Group Time: 5525-8948 ?PT Group Time Calculation (min): 60 min ? ?Short Term Goals: ?Week 1:  PT Short Term Goal 1 (Week 1): = LTG d/t ELOS ? ?Skilled Therapeutic Interventions/Progress Updates: Pt participated in therapeutic intervention via group setting. Pt participated in therapeutic activities including seated LE therex, ambulation on obstacle course including 6in step, stepping over thresholds, ambulation on compliant surface, and sidestepping. Group also participated in discussions/education of energy conservation, fall recovery, and safety in home upon d/c. Pt required some reinforcement in safety and was overall minA to CGA with mobility. Session concluded with pt pleased with activities and current needs met.  ?   ? ?Therapy Documentation ?Precautions:  ?Precautions ?Precautions: Fall ?Restrictions ?Weight Bearing Restrictions: No ?General: ?  ?Vital Signs: ?Therapy Vitals ?Temp: 98.4 ?F (36.9 ?C) ?Temp Source: Oral ?Pulse Rate: 78 ?Resp: 16 ?BP: 101/71 ?Patient Position (if appropriate): Sitting ?Oxygen Therapy ?SpO2: 100 % ?O2 Device: Room Air ?Pain: ?  ?Mobility: ?  ?Locomotion : ?   ?Trunk/Postural Assessment : ?   ?Balance: ?  ?Exercises: ?  ?Other Treatments:   ? ? ? ?Therapy/Group: Group Therapy ? ?[name] ?03/17/2022, 4:50 PM  ?

## 2022-03-18 DIAGNOSIS — F329 Major depressive disorder, single episode, unspecified: Secondary | ICD-10-CM

## 2022-03-18 DIAGNOSIS — J302 Other seasonal allergic rhinitis: Secondary | ICD-10-CM

## 2022-03-18 DIAGNOSIS — R350 Frequency of micturition: Secondary | ICD-10-CM

## 2022-03-18 LAB — URINALYSIS, COMPLETE (UACMP) WITH MICROSCOPIC
Bacteria, UA: NONE SEEN
Bilirubin Urine: NEGATIVE
Glucose, UA: NEGATIVE mg/dL
Hgb urine dipstick: NEGATIVE
Ketones, ur: NEGATIVE mg/dL
Nitrite: NEGATIVE
Protein, ur: NEGATIVE mg/dL
Specific Gravity, Urine: 1.012 (ref 1.005–1.030)
pH: 7 (ref 5.0–8.0)

## 2022-03-18 MED ORDER — PHENOL 1.4 % MT LIQD
1.0000 | OROMUCOSAL | Status: DC | PRN
  Filled 2022-03-18: qty 177

## 2022-03-18 MED ORDER — LORATADINE 10 MG PO TABS
10.0000 mg | ORAL_TABLET | Freq: Every day | ORAL | Status: DC
Start: 2022-03-18 — End: 2022-03-24
  Administered 2022-03-18 – 2022-03-24 (×7): 10 mg via ORAL

## 2022-03-18 NOTE — Progress Notes (Signed)
Occupational Therapy Session Note ? ?Patient Details  ?Name: Amanda Rich ?MRN: [id number] ?Date of Birth: 06/06/1966 ? ?Today's Date: 03/18/2022 ?OT Individual Time: 0720-0800 ?OT Individual Time Calculation (min): 40 min  and Today's Date: 03/18/2022 ?OT Missed Time: 20 Minutes ?Missed Time Reason: Other (comment) (breakfast) ? ? ?Short Term Goals: ?Week 1:  OT Short Term Goal 1 (Week 1): STG = LTG due to ELOS ? ?Skilled Therapeutic Interventions/Progress Updates:  ?  Pt received seated in w/c, agreeable to therapy after finishing breakfast. Returned 20 min later and pt ready. Session focus on self-care retraining, activity tolerance, transfer retraining, LUE coordination in prep for improved ADL/IADL/func mobility performance + decreased caregiver burden. Pt completed UBD mod I while seated in W/C. CGA for steadying assist during sit to stand while donning pants, able to cross BLE to thread them through pants. Donned shoes with set-up A. ? ?Self-propelled w/c > gym to target BUE strengthening with distant S.  ? ?Short ambulatory transfer to and from mat table with RW and CGA due to mild L hemiparesis.  ? ?To target LUE coordination and static standing balance, in maxi walking sling, pt participated in massed practice of tossing small and large ball from various stand-points with BUE and following instructions to toss to specific targets. No overt LOB although reports BLE  "shaking." Required slightly increased processing time to locate and hit targets when given verbal instructions. Only dropped ball x2.  ? ?Pt left seated in w/c with safety belt alarm engaged, call bell in reach, and all immediate needs met.  ? ? ?Therapy Documentation ?Precautions:  ?Precautions ?Precautions: Fall ?Restrictions ?Weight Bearing Restrictions: No ? ?Pain: reports sore throat/ R ear, did not rate ?ADL: See Care Tool for more details. ? ?Therapy/Group: Individual Therapy ? ?Volanda Napoleon MS, OTR/L ? ?03/18/2022, 6:57 AM ?

## 2022-03-18 NOTE — Progress Notes (Signed)
Physical Therapy Session Note ? ?Patient Details  ?Name: Amanda Rich ?MRN: [id number] ?Date of Birth: [birth year] ? ?Today's Date: 03/18/2022 ?PT Individual Time:1030-1130;  [id number] ?PT Individual Time Calculation (min): 60, 64 min  ? ?Short Term Goals: ?Week 1:  PT Short Term Goal 1 (Week 1): = LTG d/t ELOS ? ?Skilled Therapeutic Interventions/Progress Updates:  ?Tx 1: ? ?Pt seated in wc.  She reported pain R anterior thigh 2/10 due to neurosurgery femoral sit. ? ?neuromuscular re-education via forced use, multimodal cues, use of Kinetron at low resistance, and demo for : bil alternating reciprocal movements bil LEs in sitting, unilateral mass extension movements of LLE in sitting, and bil mini squats with bil UE support, focusing on complete L knee extension and hip extension to neutral. Pt demonstrated improving L knee and hip extension during session. Forced use of LLE with RLE to propel wc x 25' with min assist.  ? ?PT donned L AFO in pt's Karle Starch style walking shoes.  (Lace up shoes would provide more support. Sit> stand with close supervision, to RW.  Gait training on level tile x 52" with turns, RW, CGA/min assist for and multimodal cues for L knee and hip extension.  ? ?At end of session, pt seated in wc iwht needs at hand and seat belt alarm set. ? ?Tx 2: ? ?Pt seated in wc.  She denied pain.   ? ?neuromuscular re-education via mirror feedback, visual cues, multimodal cue for alternating reciprocal movements bil LEs using Kinetron without resistance.  L knee stance control in standing on KInetron, and stepping reciprocally on Kinetron at 60 cm/sec with focus on L glut and quad activation. ? ?Gait training on level tile, LAFO (in black shoe) x 35' with CGA and min cues for hip and knee extension on L. Up/down (4) 7" high steps, CGA, L rail, step -to method up and down.  1st trial with 2 hands on 1 rail; 2nd trial with 1 hand on 1 rail. ? ?Sit> stand with CGA, pushing through bil hands; stand> sit  throughout session with bil hands over chest, close supervision and 1 cue for eccentric control as she sat. Therapeutic activity in standing for LLE stance/swing control: kicking small cones with RLE/LLE with contralateral UE support. ? ?At end of session, pt seated in wc with seat pad alarm set and needs at hand.  LAFO doffed. Discussed with pt how more supportive lace up sneakers might be helpful now.  ? ? ?   ? ?Therapy Documentation ?Precautions:  ?Precautions ?Precautions: Fall ?Restrictions ?Weight Bearing Restrictions: No ? ?  ? ?Therapy/Group: Individual Therapy ? ?Carrolyn Hilmes ?03/18/2022, 4:31 PM  ?

## 2022-03-18 NOTE — Progress Notes (Signed)
?                                                       PROGRESS NOTE ? ? ?Subjective/Complaints: ?C/o sore throat and fullness in right ear. Also having urinary frequency/urgency ? ?ROS: Patient denies fever, rash, sore throat, blurred vision, dizziness, nausea, vomiting, diarrhea, cough, shortness of breath or chest pain, joint or back/neck pain, headache, or mood change.  ? ? ?Objective: ?  ?No results found. ?No results for input(s): WBC, HGB, HCT, PLT in the last 72 hours. ? ?No results for input(s): NA, K, CL, CO2, GLUCOSE, BUN, CREATININE, CALCIUM in the last 72 hours. ? ? ?Intake/Output Summary (Last 24 hours) at 03/18/2022 1302 ?Last data filed at 03/18/2022 8676 ?Gross per 24 hour  ?Intake 600 ml  ?Output --  ?Net 600 ml  ?  ? ?  ? ?Physical Exam: ?Vital Signs ?Blood pressure 108/69, pulse 70, temperature 98.1 ?F (36.7 ?C), temperature source Oral, resp. rate 18, height '5\' 5"'$  (1.651 m), weight 56.5 kg, last menstrual period 10/15/2017, SpO2 98 %. ?Constitutional: No distress . Vital signs reviewed. ?HEENT: NCAT, EOMI, oral membranes moist ?Neck: supple ?Cardiovascular: RRR without murmur. No JVD    ?Respiratory/Chest: CTA Bilaterally without wheezes or rales. Normal effort    ?GI/Abdomen: BS +, non-tender, non-distended ?Ext: no clubbing, cyanosis, or edema ?Psych: pleasant and cooperative  ?Skin: left anterior chest port site well healed ?Neurological:  ?   Mental Status: She is alert.  ?   Comments: Alert and oriented x 3. Normal insight and awareness. Intact Memory. Normal language and speech. Mild left central 7 and hemifacial sensory loss. LUE and LLE 1/2 for LT and pain. RUE and RLE notable for stocking glove sensory loss in fingers/below ankle respectively.  Motor 5/5 RUE and RLE. LUE 3+ to 4/5 prox to distal. LLE 4- to 4/5 prox to distal with decreased Byng and position sense.  .   ? ? ?Assessment/Plan: ?1. Functional deficits which require 3+ hours per day of interdisciplinary therapy in a  comprehensive inpatient rehab setting. ?Physiatrist is providing close team supervision and 24 hour management of active medical problems listed below. ?Physiatrist and rehab team continue to assess barriers to discharge/monitor patient progress toward functional and medical goals ? ?Care Tool: ? ?Bathing ?   ?Body parts bathed by patient: Right arm  ?   ?  ?  ?Bathing assist Assist Level: Minimal Assistance - Patient > 75% ?  ?  ?Upper Body Dressing/Undressing ?Upper body dressing   ?What is the patient wearing?: Pull over shirt ?   ?Upper body assist Assist Level: Set up assist ?   ?Lower Body Dressing/Undressing ?Lower body dressing ? ? ?   ?What is the patient wearing?: Pants ? ?  ? ?Lower body assist Assist for lower body dressing: Contact Guard/Touching assist ?   ? ?Toileting ?Toileting    ?Toileting assist Assist for toileting: Contact Guard/Touching assist ?  ?  ?Transfers ?Chair/bed transfer ? ?Transfers assist ?   ? ?Chair/bed transfer assist level: Contact Guard/Touching assist ?  ?  ?Locomotion ?Ambulation ? ? ?Ambulation assist ? ?   ? ?Assist level: Minimal Assistance - Patient > 75% ?Assistive device: Walker-rolling ?Max distance: 188f  ? ?Walk 10 feet activity ? ? ?Assist ?   ? ?Assist level: Minimal  Assistance - Patient > 75% ?Assistive device: Walker-rolling  ? ?Walk 50 feet activity ? ? ?Assist   ? ?Assist level: Minimal Assistance - Patient > 75% ?Assistive device: Walker-rolling  ? ? ?Walk 150 feet activity ? ? ?Assist Walk 150 feet activity did not occur: Safety/medical concerns ? ?  ?Assistive device: Walker-rolling ?  ? ?Walk 10 feet on uneven surface  ?activity ? ? ?Assist Walk 10 feet on uneven surfaces activity did not occur: Safety/medical concerns ? ? ?  ?   ? ?Wheelchair ? ? ? ? ?Assist Is the patient using a wheelchair?: Yes ?Type of Wheelchair: Manual ?  ? ?Wheelchair assist level: Supervision/Verbal cueing ?Max wheelchair distance: 10 ft  ? ? ?Wheelchair 50 feet with 2 turns  activity ? ? ? ?Assist ? ?  ?Wheelchair 50 feet with 2 turns activity did not occur: N/A ? ? ?Assist Level: Total Assistance - Patient < 25%  ? ?Wheelchair 150 feet activity  ? ? ? ?Assist ?   ? ? ?Assist Level: Total Assistance - Patient < 25%  ? ?Blood pressure 108/69, pulse 70, temperature 98.1 ?F (36.7 ?C), temperature source Oral, resp. rate 18, height '5\' 5"'$  (1.651 m), weight 56.5 kg, last menstrual period 10/15/2017, SpO2 98 %. ? ? ? Medical Problem List and Plan: ?1. 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   ?-Patient had been pretreated for contrast allergy with prednisone and taper as directed. ?            -patient may shower ?            -ELOS/Goals: 5-7 days, mod I goals with PT and OT ? -Continue CIR therapies including PT, OT  ?2.  Antithrombotics: ?-DVT/anticoagulation:  Mechanical: Antiembolism stockings, thigh (TED hose) Bilateral lower extremities ?            -antiplatelet therapy: Aspirin 81 mg daily, Brilinta 90 mg twice daily ?3. 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 Discussed Qutenza and she wold like to try outpatient ?4. Depression: Celexa 10 mg nightly. Discussed amitriptyline but she felt too sleepy with this in the past.  ?            -antipsychotic agents: N/A ?5. Neuropsych: This patient is capable of making decisions on her own behalf. ?6. Skin/Wound Care: Routine skin checks ?7. Fluids/Electrolytes/Nutrition: Routine in and outs with follow-up chemistries on admit. ?8.  History of right breast cancer status postlumpectomy chemo/radiation.   ?-Follow-up outpatient Dr.Kinard as well as Dr. Mickeal Skinner.   ?-Continue ARIMIDEX ?-no blood draws RUE ?9.  Asthma/tobacco use.  Continue inhalers as directed. ?10. Hypoalbuminemia: recommended choosing protein rich foods, discussed nuts are a  great source to include daily.  ?11. Hyperglycemia: discussed role of sugar and stress in elevating CBGs, discussed that they have greatly improved, recommended avoiding added sugar in diet ?12. Suboptimal potassium: supplement 30mq klor 3/23. Recommended adding nuts to daily diet.  ?13. Urinary frequency with dysuria: check UA, UCX ?14. Seasonal allergies: add claritin ? -cepacol spray for sore throat ? ?LOS: ?4 days ?A FACE TO FACE EVALUATION WAS PERFORMED ? ?ZMeredith Staggers?03/18/2022, 1:02 PM  ? ?  ?

## 2022-03-19 LAB — URINE CULTURE: Culture: <10000 — AB

## 2022-03-19 MED ORDER — CEPHALEXIN 250 MG PO CAPS
500.0000 mg | ORAL_CAPSULE | Freq: Two times a day (BID) | ORAL | Status: DC
  Administered 2022-03-19 – 2022-03-20 (×3): 500 mg via ORAL

## 2022-03-19 NOTE — Progress Notes (Signed)
Physical Therapy Session Note ? ?Patient Details  ?Name: Amanda Rich ?MRN: [id number] ?Date of Birth: 1966-05-18 ? ?Today's Date: 03/19/2022 ?PT Individual Time: 4098-1191 ?PT Individual Time Calculation (min): 45 min  ? ?Short Term Goals: ?Week 1:  PT Short Term Goal 1 (Week 1): = LTG d/t ELOS ? ?Skilled Therapeutic Interventions/Progress Updates:  ?Pt seated in wc, ready for PT to start.  She reported 2/10 bil shoulder soreness, and 3/10 L knee joint pain. Pt noted edema bil feet.  PT attempted to place L AFO in her white sneakers, but it would not fit.  TEDS, size M are too big for pt.  She reported that Nsg is supposed to order night time intermittent compression devices for bil LEs.  ? ?Sit>< stand throughout session with close supervision, without use of UEs, for increased loading of bil LEs.   ? ?neuromuscular re-education for LLE stance control via forced use, ACE LLL for foot drop, multimodal cues, mirror feedback, and demo for alternating reciprocal movement bil LEs using Kinetron in sitting at 60 cm/sec x 2 sets of 15 cycles, in standing with same parameters, for L/R lateral lifts in standing with bil UE support while "nudging footstool laterally to activity gluteus medius, in biased to L wt bearing in standing with RUE support during R hip flexion/extension. ?   ?Gait training on level tile with ACE LLE, RW, x 60' with CGA throughout including turns, rare cue for wider BOS.  With request to increase speed, pt demonstrated adequate R/L stance control.  ? ?At end of session, pt seated in wc with seat pad alarm set. ?   ? ?Therapy Documentation ?Precautions:  ?Precautions ?Precautions: Fall ?Restrictions ?Weight Bearing Restrictions: No ?   ? ? ? ?Therapy/Group: Individual Therapy ? ?Jaxsen Bernhart ?03/19/2022, 12:09 PM  ?

## 2022-03-19 NOTE — Progress Notes (Signed)
?                                                       PROGRESS NOTE ? ? ?Subjective/Complaints: ?Pt reports that her sore throat and full right ear are better this morning with claritin and chloraseptic spray. Still having urinary frequency.  ? ?Otherwise doing fairly well. Thinks TEDS might be too loose ? ?ROS: Patient denies fever, rash, sore throat, blurred vision, dizziness, nausea, vomiting, diarrhea, cough, shortness of breath or chest pain, joint or back/neck pain, headache, or mood change.  ? ? ?Objective: ?  ?No results found. ?No results for input(s): WBC, HGB, HCT, PLT in the last 72 hours. ? ?No results for input(s): NA, K, CL, CO2, GLUCOSE, BUN, CREATININE, CALCIUM in the last 72 hours. ? ?No intake or output data in the 24 hours ending 03/19/22 1021 ?  ? ?  ? ?Physical Exam: ?Vital Signs ?Blood pressure 120/72, pulse 70, temperature 98.1 ?F (36.7 ?C), resp. rate 14, height '5\' 5"'$  (1.651 m), weight 56.5 kg, last menstrual period 10/15/2017, SpO2 99 %. ?Constitutional: No distress . Vital signs reviewed. ?HEENT: NCAT, EOMI, oral membranes moist ?Neck: supple ?Cardiovascular: RRR without murmur. No JVD    ?Respiratory/Chest: CTA Bilaterally without wheezes or rales. Normal effort    ?GI/Abdomen: BS +, non-tender, non-distended ?Ext: no clubbing, cyanosis, or edema ?Psych: pleasant and cooperative  ?Skin: left anterior chest port site well healed ?Neurological:  ?   Mental Status: She is alert.  ?   Comments: Alert and oriented x 3. Normal insight and awareness. Intact Memory. Normal language and speech. Mild left central 7 and hemifacial sensory loss. LUE and LLE 1/2 for LT and pain. RUE and RLE notable for stocking glove sensory loss in fingers/below ankle respectively.  Motor 5/5 RUE and RLE. LUE 3+ to 4/5 prox to distal. LLE 4- to 4/5 prox to distal with decreased [place] and position sense. Motor and sensory exam stable. ? ? ?Assessment/Plan: ?1. Functional deficits which require 3+ hours per day of  interdisciplinary therapy in a comprehensive inpatient rehab setting. ?Physiatrist is providing close team supervision and 24 hour management of active medical problems listed below. ?Physiatrist and rehab team continue to assess barriers to discharge/monitor patient progress toward functional and medical goals ? ?Care Tool: ? ?Bathing ?   ?Body parts bathed by patient: Right arm  ?   ?  ?  ?Bathing assist Assist Level: Minimal Assistance - Patient > 75% ?  ?  ?Upper Body Dressing/Undressing ?Upper body dressing   ?What is the patient wearing?: Pull over shirt ?   ?Upper body assist Assist Level: Set up assist ?   ?Lower Body Dressing/Undressing ?Lower body dressing ? ? ?   ?What is the patient wearing?: Pants ? ?  ? ?Lower body assist Assist for lower body dressing: Contact Guard/Touching assist ?   ? ?Toileting ?Toileting    ?Toileting assist Assist for toileting: Contact Guard/Touching assist ?  ?  ?Transfers ?Chair/bed transfer ? ?Transfers assist ?   ? ?Chair/bed transfer assist level: Contact Guard/Touching assist ?  ?  ?Locomotion ?Ambulation ? ? ?Ambulation assist ? ?   ? ?Assist level: Contact Guard/Touching assist ?Assistive device: Walker-rolling (L AFO) ?Max distance: 54  ? ?Walk 10 feet activity ? ? ?Assist ?   ? ?Assist level:  Contact Guard/Touching assist ?Assistive device: Walker-rolling  ? ?Walk 50 feet activity ? ? ?Assist   ? ?Assist level: Contact Guard/Touching assist ?Assistive device: Walker-rolling  ? ? ?Walk 150 feet activity ? ? ?Assist Walk 150 feet activity did not occur: Safety/medical concerns ? ?  ?Assistive device: Walker-rolling ?  ? ?Walk 10 feet on uneven surface  ?activity ? ? ?Assist Walk 10 feet on uneven surfaces activity did not occur: Safety/medical concerns ? ? ?  ?   ? ?Wheelchair ? ? ? ? ?Assist Is the patient using a wheelchair?: Yes ?Type of Wheelchair: Manual ?  ? ?Wheelchair assist level: Supervision/Verbal cueing ?Max wheelchair distance: 150  ? ? ?Wheelchair 50 feet  with 2 turns activity ? ? ? ?Assist ? ?  ?Wheelchair 50 feet with 2 turns activity did not occur: N/A ? ? ?Assist Level: Supervision/Verbal cueing  ? ?Wheelchair 150 feet activity  ? ? ? ?Assist ?   ? ? ?Assist Level: Supervision/Verbal cueing  ? ?Blood pressure 120/72, pulse 70, temperature 98.1 ?F (36.7 ?C), resp. rate 14, height '5\' 5"'$  (1.651 m), weight 56.5 kg, last menstrual period 10/15/2017, SpO2 99 %. ? ? ? Medical Problem List and Plan: ?1. 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   ?-Patient had been pretreated for contrast allergy with prednisone and taper as directed. ?            -patient may shower ?            -ELOS/Goals: 5-7 days, mod I goals with PT and OT ? -Continue CIR therapies including PT, OT  ? -*pt tells me she's supposed to see Dr. Kathyrn Sheriff this week in office. Primary rehab team can reach out to him tomorrow to see if she can be seen here. ?2.  Antithrombotics: ?-DVT/anticoagulation:  Mechanical: Antiembolism stockings, thigh (TED hose) Bilateral lower extremities ?            -antiplatelet therapy: Aspirin 81 mg daily, Brilinta 90 mg twice daily ?3. 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 Discussed Qutenza and she wold like to try outpatient ?4. Depression: Celexa 10 mg nightly. Discussed amitriptyline but she felt too sleepy with this in the past.  ? -seems to be pretty up beat and positive ?            -antipsychotic agents: N/A ?5. Neuropsych: This patient is capable of making decisions on her own behalf. ?6. Skin/Wound Care: Routine skin checks ?7. Fluids/Electrolytes/Nutrition: Routine in and outs with follow-up chemistries on admit. ?8.  History of right breast cancer status postlumpectomy chemo/radiation.   ?-Follow-up outpatient Dr.Kinard as well as Dr. Mickeal Skinner.   ?-Continue  ARIMIDEX ?-no blood draws RUE ?*Pt has a survivorship care plan visit per onc scheduled 3/29--wanted to find out how she should go about keeping this appt/rescheduling/etc. Primary rehab team can follow up ?9.  Asthma/tobacco use.  Continue inhalers as directed. ?10. Hypoalbuminemia: recommended choosing protein rich foods, discussed nuts are a great source to include daily.  ?11. Hyperglycemia: discussed role of sugar and stress in elevating CBGs, discussed that they have greatly improved, recommended avoiding added sugar in diet ?12. Suboptimal potassium: supplement 23mq klor 3/23. Recommended adding nuts to daily diet.  ?13. Urinary frequency with dysuria: UA suspicious, UCX pending ? 3/26 begin empiric keflex '500mg'$  bid ?14. Seasonal allergies: add  claritin ? -cepacol spray for sore throat ? -3/26 improved ?LOS: ?5 days ?A FACE TO FACE EVALUATION WAS PERFORMED ? ?Meredith Staggers ?03/19/2022, 10:21 AM  ? ?  ?

## 2022-03-20 DIAGNOSIS — I639 Cerebral infarction, unspecified: Secondary | ICD-10-CM | POA: Diagnosis not present

## 2022-03-20 NOTE — Progress Notes (Signed)
Speech Language Pathology Daily Session Note ? ?Patient Details  ?Name: Amanda Rich ?MRN: [id number] ?Date of Birth: 12-28-1965 ? ?Today's Date: 03/20/2022 ?SLP Individual Time: 1021-1173 ?SLP Individual Time Calculation (min): 45 min ? ?Short Term Goals: ?Week 1: SLP Short Term Goal 1 (Week 1): STG = LTG d/t ELOS ? ?Skilled Therapeutic Interventions: ?Pt seen this date for skilled ST intervention targeting cognitive-linguistic goals. Upon arrival, pt alert/awake and seated in w/c. Agreeable to ST intervention in her hospital room. ? ?SLP facilitated today's session by providing verbal education re: compensatory attention strategies and functional scenarios in which each strategy may be utilized. Continues to endorse subtle changes in attention in comparison to baseline. Completed functional visual alternating attention task (check register) with Supervision A for use of attention strategies, particularly double checking for accuracy. Completed high-level word finding, alternating auditory attention task with Mod I. Discussed with pt importance of fatigue management and energy conservation to aid in attention to task; she verbalized understanding. No instances of word finding difficulties observed within structured or unstructured tasks.  ? ?Session concluded with pt in w/c, call bell within reach, and all immediate needs met. Pt's fiance also present. Continue per current ST plan of care next session. ? ?Pain ?Denies pain; NAD ? ?Therapy/Group: Individual Therapy ? ?Babbie Dondlinger A Itzy Adler ?03/20/2022, 1:46 PM ?

## 2022-03-20 NOTE — Progress Notes (Signed)
Physical Therapy Note ? ?Patient Details  ?Name: Amanda Rich ?MRN: [id number] ?Date of Birth: 1966/05/04 ?Today's Date: 03/20/2022 ? ? ?Attempted to see patient for scheduled group therapy session. Pt declines participation in group at this time as she is waiting to have a discussion with her Education officer, museum. Pt missed 60 min of scheduled group therapy minutes due to refusal. ? ? ?Excell Seltzer, PT, DPT, CSRS ?03/20/2022, 2:11 PM  ?

## 2022-03-20 NOTE — Progress Notes (Signed)
?                                                       PROGRESS NOTE ? ? ?Subjective/Complaints: ?She has had difficulty getting disability since she was diagnosed with cancer in August 2021. Dr. Mickeal Skinner and other members of her team did complete disability paperwork for her but it was denied twice.  ? ?ROS: Patient denies fever, rash, sore throat, blurred vision, dizziness, nausea, vomiting, diarrhea, cough, shortness of breath or chest pain, joint or back/neck pain, headache, or mood change.  ? ? ?Objective: ?  ?No results found. ?No results for input(s): WBC, HGB, HCT, PLT in the last 72 hours. ? ?No results for input(s): NA, K, CL, CO2, GLUCOSE, BUN, CREATININE, CALCIUM in the last 72 hours. ? ? ?Intake/Output Summary (Last 24 hours) at 03/20/2022 1723 ?Last data filed at 03/20/2022 1300 ?Gross per 24 hour  ?Intake 240 ml  ?Output 2 ml  ?Net 238 ml  ? ?  ? ?  ? ?Physical Exam: ?Vital Signs ?Blood pressure 113/74, pulse 86, temperature 98.5 ?F (36.9 ?C), temperature source Oral, resp. rate 18, height '5\' 5"'$  (1.651 m), weight 56.5 kg, last menstrual period 10/15/2017, SpO2 99 %. ?Constitutional: No distress . Vital signs reviewed. BMI 20.73 ?HEENT: NCAT, EOMI, oral membranes moist ?Neck: supple ?Cardiovascular: RRR without murmur. No JVD    ?Respiratory/Chest: CTA Bilaterally without wheezes or rales. Normal effort    ?GI/Abdomen: BS +, non-tender, non-distended ?Ext: no clubbing, cyanosis, or edema ?Psych: pleasant and cooperative  ?Skin: left anterior chest port site well healed ?Neurological:  ?   Mental Status: She is alert.  ?   Comments: Alert and oriented x 3. Normal insight and awareness. Intact Memory. Normal language and speech. Mild left central 7 and hemifacial sensory loss. LUE and LLE 1/2 for LT and pain. RUE and RLE notable for stocking glove sensory loss in fingers/below ankle respectively.  Motor 5/5 RUE and RLE. LUE 3+ to 4/5 prox to distal. LLE 4- to 4/5 prox to distal with decreased [place] and  position sense. Motor and sensory exam stable. ? ? ?Assessment/Plan: ?1. Functional deficits which require 3+ hours per day of interdisciplinary therapy in a comprehensive inpatient rehab setting. ?Physiatrist is providing close team supervision and 24 hour management of active medical problems listed below. ?Physiatrist and rehab team continue to assess barriers to discharge/monitor patient progress toward functional and medical goals ? ?Care Tool: ? ?Bathing ?   ?Body parts bathed by patient: Right arm  ?   ?  ?  ?Bathing assist Assist Level: Minimal Assistance - Patient > 75% ?  ?  ?Upper Body Dressing/Undressing ?Upper body dressing   ?What is the patient wearing?: Pull over shirt ?   ?Upper body assist Assist Level: Set up assist ?   ?Lower Body Dressing/Undressing ?Lower body dressing ? ? ?   ?What is the patient wearing?: Pants ? ?  ? ?Lower body assist Assist for lower body dressing: Contact Guard/Touching assist ?   ? ?Toileting ?Toileting    ?Toileting assist Assist for toileting: Contact Guard/Touching assist ?  ?  ?Transfers ?Chair/bed transfer ? ?Transfers assist ?   ? ?Chair/bed transfer assist level: Contact Guard/Touching assist ?  ?  ?Locomotion ?Ambulation ? ? ?Ambulation assist ? ?   ? ?Assist level:  Contact Guard/Touching assist ?Assistive device: Walker-rolling ?Max distance: 137f  ? ?Walk 10 feet activity ? ? ?Assist ?   ? ?Assist level: Contact Guard/Touching assist ?Assistive device: Walker-rolling  ? ?Walk 50 feet activity ? ? ?Assist   ? ?Assist level: Contact Guard/Touching assist ?Assistive device: Walker-rolling  ? ? ?Walk 150 feet activity ? ? ?Assist Walk 150 feet activity did not occur: Safety/medical concerns ? ?Assist level: Contact Guard/Touching assist ?Assistive device: Walker-rolling ?  ? ?Walk 10 feet on uneven surface  ?activity ? ? ?Assist Walk 10 feet on uneven surfaces activity did not occur: Safety/medical concerns ? ? ?  ?   ? ?Wheelchair ? ? ? ? ?Assist Is the patient  using a wheelchair?: Yes ?Type of Wheelchair: Manual ?  ? ?Wheelchair assist level: Supervision/Verbal cueing ?Max wheelchair distance: 150  ? ? ?Wheelchair 50 feet with 2 turns activity ? ? ? ?Assist ? ?  ?Wheelchair 50 feet with 2 turns activity did not occur: N/A ? ? ?Assist Level: Supervision/Verbal cueing  ? ?Wheelchair 150 feet activity  ? ? ? ?Assist ?   ? ? ?Assist Level: Supervision/Verbal cueing  ? ?Blood pressure 113/74, pulse 86, temperature 98.5 ?F (36.9 ?C), temperature source Oral, resp. rate 18, height '5\' 5"'$  (1.651 m), weight 56.5 kg, last menstrual period 10/15/2017, SpO2 99 %. ? ? ? Medical Problem List and Plan: ?1. 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   ?-Patient had been pretreated for contrast allergy with prednisone and taper as directed. ?            -patient may shower ?            -ELOS/Goals: 5-7 days, mod I goals with PT and OT ? Continue CIR therapies including PT, OT  ?2.  Antithrombotics: ?-DVT/anticoagulation:  Mechanical: Antiembolism stockings, thigh (TED hose) Bilateral lower extremities ?            -antiplatelet therapy: Aspirin 81 mg daily, Brilinta 90 mg twice daily ?3. 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 Discussed Qutenza and she wold like to try outpatient ?4. Depression: Celexa 10 mg nightly. Discussed amitriptyline but she felt too sleepy with this in the past.  ? -seems to be pretty up beat and positive ?            -antipsychotic agents: N/A ?5. Neuropsych: This patient is capable of making decisions on her own behalf. ?6. Skin/Wound Care: Routine skin checks ?7. Fluids/Electrolytes/Nutrition: Routine in and outs with follow-up chemistries on admit. ?8.  History of right breast cancer status postlumpectomy chemo/radiation.   ?-Follow-up outpatient Dr.Kinard  as well as Dr. VMickeal Skinner   ?-Continue ARIMIDEX ?-no blood draws RUE ?*Pt has a survivorship care plan visit per onc scheduled 3/29--wanted to find out how she should go about keeping this appt/rescheduling/etc. Primary rehab team can follow up ?9.  Asthma/tobacco use.  Continue inhalers as directed. ?10. Hypoalbuminemia: recommended choosing protein rich foods, discussed nuts are a great source to include daily.  ?11. Hyperglycemia: discussed role of sugar and stress in elevating CBGs, discussed that they have greatly improved, recommended avoiding added sugar in diet ?12. Suboptimal potassium: supplement 438m klor 3/23. Recommended adding nuts to daily diet. Monitor potassium outpatient.  ?13. Urinary frequency with dysuria: discontinue keflex since UC is with insignificant growth. ?14. Seasonal allergies: add claritin.  Continue cepacol spray for sore throat ?LOS: ?6 days ?A FACE TO FACE EVALUATION WAS PERFORMED ? ?Martha Clan P Devyne Hauger ?03/20/2022, 5:23 PM  ? ?  ?

## 2022-03-20 NOTE — Progress Notes (Signed)
Occupational Therapy Session Note ? ?Patient Details  ?Name: Amanda Rich ?MRN: [id number] ?Date of Birth: 27-Jan-1966 ? ?Today's Date: 03/20/2022 ?OT Individual Time: 2248-2500 ?OT Individual Time Calculation (min): 28 min  ? ? ?Short Term Goals: ?Week 1:  OT Short Term Goal 1 (Week 1): STG = LTG due to ELOS ? ?Skilled Therapeutic Interventions/Progress Updates:  ?Skilled OT intervention completed with focus on functional endurance, toileting. Pt received seated in w/c, agreeable to session. Pt requested to use bathroom, with pt transferring sit > stand with grab bar and stand pivot with grab bar with supervision <> toilet, then CGA needed for toileting tasks. Pt declined further self-care with request to work on ambulation. With LLE AFO donned, pt ambulated with RW with CGA to supervision about 180 ft with pt able to visually scan her environment for items called out by therapist to further challenge functional ambulatory balance. Cues needed for spacing BLE during gait to prevent scissoring as well as to get hips closer to RW vs leaning on walker with improvement demonstrated. Back in room, pt completed mass practice 5 sit <> stands with focus on eccentric control with eliminating BUE support. Pt able to complete with CGA to supervision without UE support, with good control demonstrated in both directions. Doffed pt's shoes per request with total A, then pt was left seated up in w/c, with chair alarm on and all needs in reach at end of session. ? ? ?Therapy Documentation ?Precautions:  ?Precautions ?Precautions: Fall ?Restrictions ?Weight Bearing Restrictions: No ? ?Pain: ?No c/o pain ? ? ?Therapy/Group: Individual Therapy ? ?Keishon Chavarin E Lashae Wollenberg ?03/20/2022, 7:51 AM ?

## 2022-03-20 NOTE — Progress Notes (Signed)
Physical Therapy Session Note ? ?Patient Details  ?Name: Amanda Rich ?MRN: [id number] ?Date of Birth: 1966/10/10 ? ?Today's Date: 03/20/2022 ?PT Individual Time: 4562-5638 ?PT Individual Time Calculation (min): 42 min  ? ?Short Term Goals: ?Week 1:  PT Short Term Goal 1 (Week 1): = LTG d/t ELOS ? ?Skilled Therapeutic Interventions/Progress Updates:  ? Received pt sitting in WC, pt agreeable to PT treatment, and reported "soreness" in shoulders but did not state pain level and declined any pain interventions. Session with emphasis on functional mobility/transfers, generalized strengthening and endurance, dynamic standing balance/coordination, and gait training. Donned L AFO with max/total A and discussed getting AFO consult with [employer] prior to discharge (most likely later this week). Pt transported to dayroom in [facility] dependently for time management purposes. Sit<>stand with RW and supervision x 2 trials throughout session and ambulated 168f with RW and CGA - pt demonstrates improvements in gait speed with less reliance on UE support ( only min cues to relax shoulders). Worked on dynamic standing balance performing LLE heel taps to 6in step 2x10 with BUE support and CGA with emphasis on hip flexor strengthening and eccentric control when lowering L foot. Sit<>stand with RW and supervision and ambulated additional 1214fwith RW and close supervision back to room. Pt transferred on/off toilet with close supervision using grab bar and able to manage clothing, void, and perform peri-care with supervision. Concluded session with pt sitting in WC washing hands, needs within reach, and chair pad alarm on. Provided pt with fresh coffee.  ? ?Therapy Documentation ?Precautions:  ?Precautions ?Precautions: Fall ?Restrictions ?Weight Bearing Restrictions: No ? ?Therapy/Group: Individual Therapy ?AnBlenda NicelyAnBecky SaxT, DPT  ?03/20/2022, 7:11 AM  ?

## 2022-03-21 DIAGNOSIS — I639 Cerebral infarction, unspecified: Secondary | ICD-10-CM | POA: Diagnosis not present

## 2022-03-21 NOTE — Progress Notes (Signed)
Physical Therapy Session Note ? ?Patient Details  ?Name: Amanda Rich ?MRN: [id number] ?Date of Birth: May 23, 1966 ? ?Today's Date: 03/21/2022 ?PT Individual Time: 0905-1000 ?PT Individual Time Calculation (min): 55 min  ? ?Short Term Goals: ?Week 1:  PT Short Term Goal 1 (Week 1): = LTG d/t ELOS ? ?Skilled Therapeutic Interventions/Progress Updates:  ?   ?Patient in w/c with RN in the room providing morning medications upon PT arrival. Patient alert and agreeable to PT session. Patient denied pain during session. ? ?Focused session on gait training with L lower extremity NMR.  ? ?Therapeutic Activity: ?Transfers: Patient performed sit to/from stand x5 with RW with supervision and 1 cue to reach back to sit for safety and x2 without AD with supervision for safety. ? ?Gait Training:  ?Gait trials: ?Trial 1: >150 feet using RW and L AFO with CGA, significantly slow gait pattern, decreased stance time on L, and constant downward head gaze, improved gaze and posture with cues ?Trial 2: >100 feet with R HHA and L AFO with facilitation for forward propulsion for increased gait speed ?Trial 3: >200 feet with R HHA progressing to no AD and L AFO with focus on increased forward propulsion for increased gait speed and arm swing for balance ?Trial 4: >100 feet with RW without L AFO with focus on L hamstring and DF activation in pre swing and swing ?Trial 5: >100 feet with R HHA without L AFO with focus on L hamstring and DF activation as above, and forward propulsion ?Trial 6: >150 feet with RW and L AFO with distant supervision with improved symmetry of stepping pattern and weight shift, cued for identifying objects at eye level for increased visual scanning of environment ? ?Patient ascended/descended 3x4 6" steps using L rail with B upper extremity support and lateral step technique with step-to gait pattern and close supervision x1 and B rails with reciprocal gait pattern for L NMR x2 with min A progressing to CGA.   ? ?Patient in w/c in the room at end of session with breaks locked, chair alarm set, and all needs within reach.  ? ?Therapy Documentation ?Precautions:  ?Precautions ?Precautions: Fall ?Restrictions ?Weight Bearing Restrictions: No ? ? ? ?Therapy/Group: Individual Therapy ? ?Doreene Burke PT, DPT ? ?03/21/2022, 12:38 PM  ?

## 2022-03-21 NOTE — Progress Notes (Signed)
Occupational Therapy Session Note ? ?Patient Details  ?Name: Amanda Rich ?MRN: [id number] ?Date of Birth: March 01, 1966 ? ?Today's Date: 03/21/2022 ?OT Individual Time: 2706-2376 ?OT Individual Time Calculation (min): 61 min  ? ? ?Short Term Goals: ?Week 1:  OT Short Term Goal 1 (Week 1): STG = LTG due to ELOS ? ?Skilled Therapeutic Interventions/Progress Updates:  ?Skilled OT intervention completed with focus on functional endurance, ADL retraining. Pt received seated in w/c, ready for a full shower. Completed stand without AD with supervision then stand pivot with L hand on grab bar to commode, with pt managing all clothing and pericare with supervision in stance. Completed short ambulatory transfer with RW to TTB with CGA, with cues needed for pivot. Educated pt on increasing safety with showers/set up by keeping grip socks/shoes on to doff clothing before sitting and pivot into shower to minimize falls, as well as shower curtain management, and exiting the shower. Supervision required for all seated bathing. Pt completed sit > stand without RW then stand pivot back to chair with CGA. Seated in chair pt completed all dressing with supervision at the sit to stand level with supervision for balance with RW. Pt applied lotion and groomed with set up A. Pt demonstrated improved ability to donn both shoes including L AFO, with supervision as well as improved dexterity in L hand with tying laces. Pt completed facial hygiene/cosmetic application with mod I. ? ?Pt ambulated total of 360 ft with RW and CGA to supervision, to promote functional endurance. Challenged pt to work on multi-tasking with focus on gait stride/speed as well as scanning her environment. Challenged pt further by sitting EOM, then stand without AD or UE support with supervision, then pt participated in balance strategies and BUE coordination via ball tossing/throwing without AD with close supervision. Pt reported feeling a little anxious with this  task, however felt better with proper/wider BOS, and able to complete task with no LOB. Education provided about TTB recommendation, and CSW to coordinate option of going through insurance or OOP. Pt was left seated in w/c, with chair alarm on and all needs in reach at end of session.  ? ? ?Therapy Documentation ?Precautions:  ?Precautions ?Precautions: Fall ?Restrictions ?Weight Bearing Restrictions: No ? ?Pain: ?No c/o pain ? ? ?Therapy/Group: Individual Therapy ? ?Tylek Boney E Nancee Brownrigg ?03/21/2022, 7:47 AM ?

## 2022-03-21 NOTE — Progress Notes (Signed)
?                                                       PROGRESS NOTE ? ? ?Subjective/Complaints: ?No new complaints this morning ?She is excited to go home this week ?She was able to get in touch with medical records and was able to get the forms that need to be completed ? ?ROS: Patient denies fever, rash, sore throat, blurred vision, dizziness, nausea, vomiting, diarrhea, cough, shortness of breath or chest pain, joint or back/neck pain, headache, or mood change. +neuropathic pain ? ? ?Objective: ?  ?No results found. ?No results for input(s): WBC, HGB, HCT, PLT in the last 72 hours. ? ?No results for input(s): NA, K, CL, CO2, GLUCOSE, BUN, CREATININE, CALCIUM in the last 72 hours. ? ? ?Intake/Output Summary (Last 24 hours) at 03/21/2022 1051 ?Last data filed at 03/20/2022 1828 ?Gross per 24 hour  ?Intake 600 ml  ?Output --  ?Net 600 ml  ? ?  ? ?  ? ?Physical Exam: ?Vital Signs ?Blood pressure 118/73, pulse 77, temperature 97.7 ?F (36.5 ?C), temperature source Oral, resp. rate 18, height '5\' 5"'$  (1.651 m), weight 56.5 kg, last menstrual period 10/15/2017, SpO2 100 %. ?Constitutional: No distress . Vital signs reviewed. BMI 20.73 ?HEENT: NCAT, EOMI, oral membranes moist ?Neck: supple ?Cardiovascular: RRR without murmur. No JVD    ?Respiratory/Chest: CTA Bilaterally without wheezes or rales. Normal effort    ?GI/Abdomen: BS +, non-tender, non-distended ?Ext: no clubbing, cyanosis, or edema ?Psych: pleasant and cooperative  ?Skin: left anterior chest port site well healed ?Neurological:  ?   Mental Status: She is alert. Good medical historian ?   Comments: Alert and oriented x 3. Normal insight and awareness. Intact Memory. Normal language and speech. Mild left central 7 and hemifacial sensory loss. LUE and LLE 1/2 for LT and pain. RUE and RLE notable for stocking glove sensory loss in fingers/below ankle respectively.  Motor 5/5 RUE and RLE. LUE 3+ to 4/5 prox to distal. LLE 4- to 4/5 prox to distal with decreased [place]  and position sense. Motor and sensory exam stable. ? ? ?Assessment/Plan: ?1. Functional deficits which require 3+ hours per day of interdisciplinary therapy in a comprehensive inpatient rehab setting. ?Physiatrist is providing close team supervision and 24 hour management of active medical problems listed below. ?Physiatrist and rehab team continue to assess barriers to discharge/monitor patient progress toward functional and medical goals ? ?Care Tool: ? ?Bathing ?   ?Body parts bathed by patient: Right arm  ?   ?  ?  ?Bathing assist Assist Level: Minimal Assistance - Patient > 75% ?  ?  ?Upper Body Dressing/Undressing ?Upper body dressing   ?What is the patient wearing?: Pull over shirt ?   ?Upper body assist Assist Level: Set up assist ?   ?Lower Body Dressing/Undressing ?Lower body dressing ? ? ?   ?What is the patient wearing?: Pants ? ?  ? ?Lower body assist Assist for lower body dressing: Contact Guard/Touching assist ?   ? ?Toileting ?Toileting    ?Toileting assist Assist for toileting: Contact Guard/Touching assist ?  ?  ?Transfers ?Chair/bed transfer ? ?Transfers assist ?   ? ?Chair/bed transfer assist level: Contact Guard/Touching assist ?  ?  ?Locomotion ?Ambulation ? ? ?Ambulation assist ? ?   ? ?  Assist level: Contact Guard/Touching assist ?Assistive device: Walker-rolling ?Max distance: 117f  ? ?Walk 10 feet activity ? ? ?Assist ?   ? ?Assist level: Contact Guard/Touching assist ?Assistive device: Walker-rolling  ? ?Walk 50 feet activity ? ? ?Assist   ? ?Assist level: Contact Guard/Touching assist ?Assistive device: Walker-rolling  ? ? ?Walk 150 feet activity ? ? ?Assist Walk 150 feet activity did not occur: Safety/medical concerns ? ?Assist level: Contact Guard/Touching assist ?Assistive device: Walker-rolling ?  ? ?Walk 10 feet on uneven surface  ?activity ? ? ?Assist Walk 10 feet on uneven surfaces activity did not occur: Safety/medical concerns ? ? ?  ?   ? ?Wheelchair ? ? ? ? ?Assist Is the  patient using a wheelchair?: Yes ?Type of Wheelchair: Manual ?  ? ?Wheelchair assist level: Supervision/Verbal cueing ?Max wheelchair distance: 150  ? ? ?Wheelchair 50 feet with 2 turns activity ? ? ? ?Assist ? ?  ?Wheelchair 50 feet with 2 turns activity did not occur: N/A ? ? ?Assist Level: Supervision/Verbal cueing  ? ?Wheelchair 150 feet activity  ? ? ? ?Assist ?   ? ? ?Assist Level: Supervision/Verbal cueing  ? ?Blood pressure 118/73, pulse 77, temperature 97.7 ?F (36.5 ?C), temperature source Oral, resp. rate 18, height '5\' 5"'$  (1.651 m), weight 56.5 kg, last menstrual period 10/15/2017, SpO2 100 %. ? ? ? Medical Problem List and Plan: ?1. 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   ?-Patient had been pretreated for contrast allergy with prednisone and taper as directed. ?            -patient may shower ?            -ELOS/Goals: 10 days, mod I goals with PT and OT ? Continue CIR therapies including PT, OT  ? Discussed that I will help her with her diasbility application ?2.  Antithrombotics: ?-DVT/anticoagulation:  Mechanical: Antiembolism stockings, thigh (TED hose) Bilateral lower extremities ?            -antiplatelet therapy: Aspirin 81 mg daily, Brilinta 90 mg twice daily ?3. 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 Discussed Qutenza and she wold like to try outpatient ?4. Depression: Celexa 10 mg nightly. Discussed amitriptyline but she felt too sleepy with this in the past.  ? -seems to be pretty up beat and positive ?            -antipsychotic agents: N/A ?5. Neuropsych: This patient is capable of making decisions on her own behalf. ?6. Skin/Wound Care: Routine skin checks ?7. Fluids/Electrolytes/Nutrition: Routine in and outs with follow-up chemistries on admit. ?8.  History of right breast cancer  status postlumpectomy chemo/radiation.   ?-Follow-up outpatient Dr.Kinard as well as Dr. VMickeal Skinner   ?-Continue ARIMIDEX ?-no blood draws RUE ?*Pt has a survivorship care plan visit per onc scheduled 3/29--wanted to find out how she should go about keeping this appt/rescheduling/etc. Primary rehab team can follow up ?9.  Asthma/tobacco use.  Continue albuterol nebulizer PRN ?10. Hypoalbuminemia: recommended choosing protein rich foods, discussed nuts are a great source to include daily.  ?11. Hyperglycemia: discussed role of sugar and stress in elevating CBGs, discussed that they have greatly improved, recommended avoiding added sugar in diet. No need for CBG checks.  ?12. Suboptimal potassium: supplement 457m klor 3/23. Recommended adding nuts to daily diet. Monitor potassium outpatient.  ?  13. Urinary frequency with dysuria: discontinue keflex since UC is with insignificant growth. ?14. Seasonal allergies: add claritin. Continue cepacol spray for sore throat ?LOS: ?7 days ?A FACE TO FACE EVALUATION WAS PERFORMED ? ?Amanda Rich ?03/21/2022, 10:51 AM  ? ?  ?

## 2022-03-21 NOTE — Progress Notes (Signed)
Physical Therapy Session Note ? ?Patient Details  ?Name: Amanda Rich ?MRN: [id number] ?Date of Birth: 1966-04-22 ? ?Today's Date: 03/21/2022 ?PT Individual Time: 3570-1779 ?PT Individual Time Calculation (min): 64 min  ? ?Short Term Goals: ?Week 1:  PT Short Term Goal 1 (Week 1): = LTG d/t ELOS ? ?Skilled Therapeutic Interventions/Progress Updates:  ?Patient seated upright in w/c on entrance to room. Patient alert and agreeable to PT session.  ? ?Patient with no pain complaint throughout session. ? ?Therapeutic Activity: ?Transfers: Patient performed sit<>stand and stand pivot transfers throughout session with supervision and no use of UE. Provided verbal cues to continue sit<>stands with no UE support throughout session. ? ?Pt requests to practice car transfer with step in order to mimic use of running board to enter/ exit boyfriend's truck. She performs car transfer with 4" step and is able to complete with close supervision/ int CGA.   ? ?Gait Training:  ?Patient ambulated from room to therapy gym with RW, about therapy gym with no AD, from main gym to ortho gym and back with no AD, then amb back to room with hurrycane.Pt provided with CGAthroughout.  In final amb bout CGA provided by pt's son having just arrived to visit. Demonstrated R knee instability with no buckle and LLE stability provided by AFO.  ? ?Pt guided in curb step training on/ off of 5" curb then 8" curb. RW used for both steps x2. Final 5" curb step performed with no AD and RUE HHA with CGA. Provided vc/ tc for instructions once prior to performance and pt performs with no further instructions required.. ? ?Neuromuscular Re-ed: ?NMR facilitated during session with focus on standing balance, increased muscle recruitment. Pt guided in sit<>stands with pauses in descent with pt able to hold balance throughout. Progressed to minisquats with AFO loosened and no UE support 2x10 reps. NMR performed for improvements in motor control and coordination,  balance, sequencing, judgement, and self confidence/ efficacy in performing all aspects of mobility at highest level of independence.  ? ?Patient seated upright  in w/c at end of session with brakes locked, belt alarm set, and all needs within reach.  ? ?Therapy Documentation ?Precautions:  ?Precautions ?Precautions: Fall ?Restrictions ?Weight Bearing Restrictions: No ?General: ?  ?Vital Signs: ?Therapy Vitals ?Resp: 18 ?Patient Position (if appropriate): Sitting ?Oxygen Therapy ?O2 Device: Room Air ?Pain: ? No pain complaint throughout session. ? ?Therapy/Group: Individual Therapy ? ?Alger Simons PT, DPT ?03/21/2022, 12:55 PM  ?

## 2022-03-22 ENCOUNTER — Encounter: Payer: Medicaid Other | Admitting: Adult Health

## 2022-03-22 DIAGNOSIS — I639 Cerebral infarction, unspecified: Secondary | ICD-10-CM | POA: Diagnosis not present

## 2022-03-22 MED ORDER — MAGNESIUM GLUCONATE 500 MG PO TABS
250.0000 mg | ORAL_TABLET | Freq: Every day | ORAL | Status: DC
  Administered 2022-03-22 – 2022-03-23 (×2): 250 mg via ORAL

## 2022-03-22 NOTE — Progress Notes (Signed)
Physical Therapy Session Note ? ?Patient Details  ?Name: Amanda Rich ?MRN: [id number] ?Date of Birth: 03/25/1966 ? ?Today's Date: 03/22/2022 ?PT Individual Time: 6659-9357 ?PT Individual Time Calculation (min): 55 min  ? ?Short Term Goals: ?Week 1:  PT Short Term Goal 1 (Week 1): = LTG d/t ELOS ? ?Skilled Therapeutic Interventions/Progress Updates:  ? Received pt sitting in [facility] ready for therapy. Pt agreeable to PT treatment and did not report any pain during session. [employer] present for AFO consult during session. Session with emphasis on brace fitting, toileting, functional mobility/transfers, dynamic standing balance, NMR, and gait training. Pt reported urge to void, stood with hurrycane and supervision and ambulated in/out of bathroom with hurrycane and supervision. Pt able to manage clothing and perform all toileting tasks with supervision - stood at sink and washed hands with supervision. Pt ambulated 12f with hurrycane and close supervision to dayroom and began BLE strengthning only on Nustep at workload 1 for 2 minutes prior to HTaylorarriving. Ambulated additional 1539fwith hurrycane and supervision to demonstrate gait pattern to [employer], then returned to Nustep after AFO consult for additional 6 minutes on workload 1 using BLEs only for a total of 86 steps with emphasis on LLE strengthening, reciprocal movement, and endurance. Ambulated 1552fith supervision to mat and worked on LLE toe taps to 3 cones 2x5 with hurrycane and CGA for balance with emphasis on L hip flexion, coordination, and eccentric control. Pt ambulated additional >200f81fth hurrycane and supervision back to room - emphasis on reciprocal stepping, increasing gait speed, and facilitating arm swing. Concluded session with pt sitting in WC, needs within reach, and chair pad alarm on.  ? ?Therapy Documentation ?Precautions:  ?Precautions ?Precautions: Fall ?Restrictions ?Weight Bearing Restrictions: No ? ?Therapy/Group: Individual  Therapy ?AnnaBlenda NicelynaBecky Sax DPT  ?03/22/2022, 7:32 AM  ?

## 2022-03-22 NOTE — Patient Care Conference (Signed)
Inpatient RehabilitationTeam Conference and Plan of Care Update ?Date: 03/22/2022   Time: 11:43 AM  ? ? ?Patient Name: Amanda Rich      ?Medical Record Number: [id number]  ?Date of Birth: [birth year] ?Sex: Female         ?Room/Bed: 1O84Z/6S06T-01 ?Payor Info: Payor: [place] / Plan: [place] MEDICAID [facility] / Product Type: *No Product type* /   ? ?Admit Date/Time:  03/14/2022  1:39 PM ? 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 ?Hospital Problems: Principal Problem: ?  Subcortical infarction [facility]) ?Active Problems: ?  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 ? ? ?Expected Discharge Date: Expected Discharge Date: 03/24/22 ? ?Team Members Present: ?Physician leading conference: Dr. Leeroy Cha ?Social Worker Present: Erlene Quan, BSW ?Nurse Present: Dorien Chihuahua, RN ?PT Present: Becky Sax, PT ?OT Present: Laverle Hobby, OT ?SLP Present: Other (comment) ?PPS Coordinator present : Ileana Ladd, PT ? ?   Current Status/Progress Goal Weekly Team Focus  ?Bowel/Bladder ? ? Continent of B/B. LBM 3/28  Maintain continence.  Assist with toielting needs as needed   ?Swallow/Nutrition/ Hydration ? ?           ?ADL's ? ? supervision to CGA for all self-care at the sit > stand/shower level, still requires safety cues/education  mod I, supervision  Safety awareness, endurance, functional strengthening, TTB transfer   ?Mobility ? ? transfers with RW supervision, gait 157f with RW CGA/supervision  mod I, supervision steps  functional mobility/transfers, generalized strengthening and endurance, dynamic standing balance/coordination, NMR, gait training, D/C planning   ?Communication ? ?           ?Safety/Cognition/ Behavioral Observations ?           ?Pain ? ? no c/o pain  Pain </=3/10  Assess Qshift and prn   ?Skin ? ? Incisions closed  Maintain skin integrity  Assess Qshift and prn   ? ? ?Discharge Planning:  ?discharging home with PRN assistance from S.O    ?Team Discussion: ?Patient with back spasms ?Patient on target to meet rehab goals: ?yes, currently needs supervison overall. Completes ADLs with CGA. Goals for discharge set for mod I overall. ? ?*See Care Plan and progress notes for long and short-term goals.  ? ?Revisions to Treatment Plan:  ?Referral for OP botox for toe or capsicin ?AFO consult ?  ?Teaching Needs: ?Safety, medication management, etc.  ?Current Barriers to Discharge: ?Decreased caregiver support ? ?Possible Resolutions to Barriers: ?Family education ?OP follow up services ?DME: TTB, RW ?  ? ? Medical Summary ?Current Status: neuropathy, toe spasticity, foot drop, subcortical infarction ? Barriers to Discharge: Medical stability ? Barriers to Discharge Comments: neuropathy, toe spasticity, foot drop, contrast allergy, subcortical infarction ?Possible Resolutions to BCelanese CorporationFocus: discussed QHermleighoutpatient, continue gabapentin, continue prednisone taper, start magnesium '250mg'$  HS, completed disability paperwork ? ? ?Continued Need for Acute Rehabilitation Level of Care: The patient requires daily medical management by a physician with specialized training in physical medicine and rehabilitation for the following reasons: ?Direction of a multidisciplinary physical rehabilitation program to maximize functional independence : Yes ?Medical management of patient stability for increased activity during participation in an intensive rehabilitation regime.: Yes ?Analysis of laboratory values and/or radiology reports with any subsequent need for medication adjustment and/or medical intervention. : Yes ? ? ?I attest that I was present, lead the team conference, and concur with the assessment and plan of the team. ? ? ?SDorien ChihuahuaB ?03/22/2022, 3:52 PM  ? ? ? ? ? ? ?

## 2022-03-22 NOTE — Progress Notes (Signed)
?                                                       PROGRESS NOTE ? ? ?Subjective/Complaints: ?No new complaints this morning ?She is excited to go home this week ?She was able to get in touch with medical records and was able to get the forms that need to be completed ? ?ROS: Patient denies fever, rash, sore throat, blurred vision, dizziness, nausea, vomiting, diarrhea, cough, shortness of breath or chest pain, joint or back/neck pain, headache, or mood change. +neuropathic pain ? ? ?Objective: ?  ?No results found. ?No results for input(s): WBC, HGB, HCT, PLT in the last 72 hours. ? ?No results for input(s): NA, K, CL, CO2, GLUCOSE, BUN, CREATININE, CALCIUM in the last 72 hours. ? ?No intake or output data in the 24 hours ending 03/22/22 0954 ? ?  ? ?  ? ?Physical Exam: ?Vital Signs ?Blood pressure 122/73, pulse 65, temperature 98.1 ?F (36.7 ?C), temperature source Oral, resp. rate 18, height '5\' 5"'$  (1.651 m), weight 56.5 kg, last menstrual period 10/15/2017, SpO2 96 %. ?Constitutional: No distress . Vital signs reviewed. BMI 20.73 ?HEENT: NCAT, EOMI, oral membranes moist ?Neck: supple ?Cardiovascular: RRR without murmur. No JVD    ?Respiratory/Chest: CTA Bilaterally without wheezes or rales. Normal effort    ?GI/Abdomen: BS +, non-tender, non-distended ?Ext: no clubbing, cyanosis, or edema ?Psych: pleasant and cooperative  ?Skin: left anterior chest port site well healed ?Neurological:  ?   Mental Status: She is alert. Good medical historian ?   Comments: Alert and oriented x 3. Normal insight and awareness. Intact Memory. Normal language and speech. Mild left central 7 and hemifacial sensory loss. LUE and LLE 1/2 for LT and pain. RUE and RLE notable for stocking glove sensory loss in fingers/below ankle respectively.  Motor 5/5 RUE and RLE. LUE 3+ to 4/5 prox to distal. LLE 4- to 4/5 prox to distal with decreased Baxter and position sense. Motor and sensory exam stable. Left foot drop ? ? ?Assessment/Plan: ?1.  Functional deficits which require 3+ hours per day of interdisciplinary therapy in a comprehensive inpatient rehab setting. ?Physiatrist is providing close team supervision and 24 hour management of active medical problems listed below. ?Physiatrist and rehab team continue to assess barriers to discharge/monitor patient progress toward functional and medical goals ? ?Care Tool: ? ?Bathing ?   ?Body parts bathed by patient: Right arm, Left arm, Chest, Abdomen, Front perineal area, Buttocks, Right upper leg, Left upper leg, Right lower leg, Face, Left lower leg  ?   ?  ?  ?Bathing assist Assist Level: Supervision/Verbal cueing ?  ?  ?Upper Body Dressing/Undressing ?Upper body dressing   ?What is the patient wearing?: Pull over shirt, Bra ?   ?Upper body assist Assist Level: Set up assist ?   ?Lower Body Dressing/Undressing ?Lower body dressing ? ? ?   ?What is the patient wearing?: Pants, Underwear/pull up ? ?  ? ?Lower body assist Assist for lower body dressing: Contact Guard/Touching assist ?   ? ?Toileting ?Toileting    ?Toileting assist Assist for toileting: Supervision/Verbal cueing ?  ?  ?Transfers ?Chair/bed transfer ? ?Transfers assist ?   ? ?Chair/bed transfer assist level: Contact Guard/Touching assist ?  ?  ?Locomotion ?Ambulation ? ? ?Ambulation assist ? ?   ? ?  Assist level: Contact Guard/Touching assist ?Assistive device: Walker-rolling ?Max distance: 111f  ? ?Walk 10 feet activity ? ? ?Assist ?   ? ?Assist level: Contact Guard/Touching assist ?Assistive device: Walker-rolling  ? ?Walk 50 feet activity ? ? ?Assist   ? ?Assist level: Contact Guard/Touching assist ?Assistive device: Walker-rolling  ? ? ?Walk 150 feet activity ? ? ?Assist Walk 150 feet activity did not occur: Safety/medical concerns ? ?Assist level: Contact Guard/Touching assist ?Assistive device: Walker-rolling ?  ? ?Walk 10 feet on uneven surface  ?activity ? ? ?Assist Walk 10 feet on uneven surfaces activity did not occur: Safety/medical  concerns ? ? ?  ?   ? ?Wheelchair ? ? ? ? ?Assist Is the patient using a wheelchair?: Yes ?Type of Wheelchair: Manual ?  ? ?Wheelchair assist level: Supervision/Verbal cueing ?Max wheelchair distance: 150  ? ? ?Wheelchair 50 feet with 2 turns activity ? ? ? ?Assist ? ?  ?Wheelchair 50 feet with 2 turns activity did not occur: N/A ? ? ?Assist Level: Supervision/Verbal cueing  ? ?Wheelchair 150 feet activity  ? ? ? ?Assist ?   ? ? ?Assist Level: Supervision/Verbal cueing  ? ?Blood pressure 122/73, pulse 65, temperature 98.1 ?F (36.7 ?C), temperature source Oral, resp. rate 18, height '5\' 5"'$  (1.651 m), weight 56.5 kg, last menstrual period 10/15/2017, SpO2 96 %. ? ? ? Medical Problem List and Plan: ?1. 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   ? ?            -patient may shower ?            -ELOS/Goals: 10 days, mod I goals with PT and OT ? Continue CIR therapies including PT, OT  ? Discussed that I will help her with her diasbility application ? -Interdisciplinary Team Conference today   ?2.  Antithrombotics: ?-DVT/anticoagulation:  Mechanical: Antiembolism stockings, thigh (TED hose) Bilateral lower extremities ?            -antiplatelet therapy: Aspirin 81 mg daily, Brilinta 90 mg twice daily ?3. 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 Discussed Qutenza and she wold like to try outpatient ?4. Depression: Celexa 10 mg nightly. Discussed amitriptyline but she felt too sleepy with this in the past.  ? -seems to be pretty up beat and positive ?            -antipsychotic agents: N/A ?5. Neuropsych: This patient is capable of making decisions on her own behalf. ?6. Skin/Wound Care: Routine skin checks ?7. Fluids/Electrolytes/Nutrition: Routine in and outs with follow-up chemistries on admit. ?8.  History of right  breast cancer status postlumpectomy chemo/radiation.   ?-Follow-up outpatient Dr.Kinard as well as Dr. VMickeal Skinner   ?-continue ARIMIDEX ?-no blood draws RUE ?*Pt has a survivorship care plan visit per onc scheduled 3/29--wanted to find out how she should go about keeping this appt/rescheduling/etc. Primary rehab team can follow up ?9.  Asthma/tobacco use.  Continue albuterol nebulizer PRN ?10. Hypoalbuminemia: recommended choosing protein rich foods, discussed nuts are a great source to include daily.  ?11. Hyperglycemia: discussed role of sugar and stress in elevating CBGs, discussed that they have greatly improved, recommended avoiding added sugar in diet. No need for CBG checks. Prednisone discontinued.  ?12. Suboptimal potassium: supplement 445m klor 3/23. Recommended adding nuts to daily diet. Monitor potassium outpatient.  ?13. Urinary  frequency with dysuria: discontinue keflex since UC is with insignificant growth. ?14. Seasonal allergies: add claritin. Continue cepacol spray for sore throat ?15. Left foot drop: AFO ordered ? ?LOS: ?8 days ?A FACE TO FACE EVALUATION WAS PERFORMED ? ?Amanda Rich ?03/22/2022, 9:54 AM  ? ?  ?

## 2022-03-22 NOTE — Progress Notes (Signed)
Speech Language Pathology Discharge Summary ? ?Patient Details  ?Name: Amanda Rich ?MRN: [id number] ?Date of Birth: 11-11-1966 ? ?Today's Date: 03/22/2022 ?SLP Individual Time: 7124-5809 ?SLP Individual Time Calculation (min): 35 min ? ? ?Skilled Therapeutic Interventions:  Skilled treatment session focused on cognitive-linguistic goals. Upon arrival, patient was awake while upright in the wheelchair. Patient participated in a functional conversation that focused on recall of therapy goals and events as well as anticipatory awareness for d/c planning. Patient was overall Mod I for word-finding throughout. Patient also independently verbalized strategies to maximize word-finding and for energy conservation regarding mental fatigue. Overall, patient appears to be at her cognitive-linguistic baseline. Patient verbalized agreement. Patient left upright in wheelchair with all needs within reach.  ? ? ?Patient has met 2 of 2 long term goals.  Patient to discharge at overall Modified Independent level.  ? ?Reasons goals not met: N/A  ? ?Clinical Impression/Discharge Summary: Patient has made excellent gains and has met 2 of 2 LTGs this admission. Currently, patient is overall Mod I for word-finding at the conversation level and for divided attention with complex tasks. Patient education is complete. Patient is at her cognitive-linguistic baseline and will be discharged from skilled SLP intervention with f/u not warranted at this time. She verbalized understanding and agreement.  ? ?Recommendation:  ?None  ?   ?Equipment: N/A  ? ?Reasons for discharge: Discharged from hospital;Treatment goals met  ? ?Patient/Family Agrees with Progress Made and Goals Achieved: Yes  ? ? ?[name] ?03/22/2022, 3:09 PM ? ?

## 2022-03-22 NOTE — Progress Notes (Signed)
Orthopedic Tech Progress Note ?Patient Details:  ?Amanda Rich ?[birth year] ?[id number] ? ?Called in order to [employer] for an AFO ? ?Patient ID: Amanda Rich, female   DOB: 1966-02-19, 56 y.o.   MRN: [id number] ? ?Amanda Rich ?03/22/2022, 2:00 PM ? ?

## 2022-03-22 NOTE — Progress Notes (Addendum)
Patient ID: Amanda Rich, female   DOB: 1966-07-26, 56 y.o.   MRN: [id number] ? ?Team Conference Report to Patient/Family ? ?Team Conference discussion was reviewed with the patient and caregiver, including goals, any changes in plan of care and target discharge date.  Patient and caregiver express understanding and are in agreement.  The patient has a target discharge date of 03/24/22. ? ?SW met with patient and provided team conference updates. SW faxed patient medical records authorization form. SW will fax patient disability paperwork and provide back to patient. Patient requesting OP therapy. SW will order patient a RE, quad base cane and shower chair with back. No additional questions or concerns.  ? ?Dyanne Iha ?03/22/2022, 2:00 PM  ?

## 2022-03-22 NOTE — Progress Notes (Signed)
Occupational Therapy Session Note ? ?Patient Details  ?Name: Amanda Rich ?MRN: [id number] ?Date of Birth: October 26, 1966 ? ?Today's Date: 03/22/2022 ?OT Individual Time: 7227-7375 ?OT Individual Time Calculation (min): 53 min  ? ? ?Short Term Goals: ?Week 1:  OT Short Term Goal 1 (Week 1): STG = LTG due to ELOS ? ?Skilled Therapeutic Interventions/Progress Updates:  ?   ?Pt received seated in w/c, no c/o pain, agreeable to therapy. Session focus on self-care retraining, activity tolerance, transfer retraining ,dynamic standing balance, community reintegration, IADL retraining in prep for improved ADL/IADL/func mobility performance + decreased caregiver burden. Reports having already made bed herself mod I from seated position in w/c. Had set-up materials to wash up at sink. Completed sink side UB bathing/dressing mod I (seated in w/c) + LBD with distant S for sit to stand + LB bathing while seated with mod I. Discussed safe home set-up to decrease falls risk and pt with good understanding.  ? ?Pt ambulated throughout session with close S and hurry cane per pt request. Ambulated down to [place] and able to order coffee, pay for item, and prepare while managing  wallet and cane in B hands. Only one instance of dropping cane requiring assist to pick up.  ? ?Pt left seated in w/c with chair pad alarm engaged, call bell in reach, and all immediate needs met.  ? ? ?Therapy Documentation ?Precautions:  ?Precautions ?Precautions: Fall ?Restrictions ?Weight Bearing Restrictions: No ? ?Pain: no c/o throughout ?  ?ADL: See Care Tool for more details. ? ? ?Therapy/Group: Individual Therapy ? ?Volanda Napoleon MS, OTR/L ? ?03/22/2022, 6:50 AM ?

## 2022-03-22 NOTE — Discharge Summary (Signed)
Physician Discharge Summary  ?Patient ID: ?Amanda Rich ?MRN: [id number] ?DOB/AGE: 03-10-1966 56 y.o. ? ?Admit date: 03/14/2022 ?Discharge date: 03/24/2022 ? ?Discharge Diagnoses:  ?Principal Problem: ?  Subcortical infarction [facility]) ?Active Problems: ?  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 ?Discharged Condition: Stable ? ?Significant Diagnostic Studies: ?CT HEAD WO CONTRAST (5MM) ? ?Result Date: 03/12/2022 ?CLINICAL DATA:  Neuro deficit, acute, stroke suspected CT head EXAM: CT HEAD WITHOUT CONTRAST TECHNIQUE: Contiguous axial images were obtained from the base of the skull through the vertex without intravenous contrast. RADIATION DOSE REDUCTION: This exam was performed according to the departmental dose-optimization program which includes automated exposure control, adjustment of the mA and/or kV according to patient size and/or use of iterative reconstruction technique. COMPARISON:  01/03/2022. FINDINGS: Brain: No evidence of acute large vascular territory infarction, hemorrhage, hydrocephalus, extra-axial collection or mass lesion/mass effect. Vascular: Right paraclinoid ICA stent. No hyperdense vessel identified. Skull: No acute fracture. Sinuses/Orbits: Clear sinuses.  Unremarkable orbits. Other: No mastoid effusions. IMPRESSION: No evidence of acute intracranial abnormality. Electronically Signed   By: Margaretha Sheffield M.D.   On: 03/12/2022 15:58  ? ?IR Transcath/Emboliz ? ?Result Date: 03/10/2022 ?PROCEDURE: PIPELINE EMBOLIZATION OF RIGHT INTERNAL CAROTID ARTERY ANEURYSM HISTORY: The patient is a 56 year old woman with incidental discovery of a right internal carotid artery aneurysm. Patient was seen in the Outpatient neurosurgery clinic and after lengthy discussion of treatment options she elected to proceed with pipeline embolization. Patient has been pretreated with 81 mg aspirin daily and 90 mg Brilinta twice a day. ACCESS: The  technical aspects of the procedure as well as its potential risks and benefits were reviewed with the patient. These risks included but were not limited to stroke, intracranial hemorrhage, bleeding, infection, allergic reaction, damage to organs or vital structures, stroke, non-diagnostic procedure, and the catastrophic outcomes of heart attack, coma, and death. With an understanding of these risks, informed consent was obtained and witnessed. The patient was placed in the supine position on the angiography table and the skin of right groin prepped in the usual sterile fashion. The procedure was performed under general anesthesia. A 8- French sheath was introduced in the right common femoral artery using Seldinger technique. MEDICATIONS: HEPARIN: 5000 Units total. CONTRAST:  cc, Omnipaque 300 FLUOROSCOPY TIME:  FLUOROSCOPY TIME: See IR records TECHNIQUE: CATHETERS AND WIRES 180 cm 0.035" glidewire 6-French NeuronMax guide sheath 6-French Berenstein Select JB-1 catheter 0.058" CatV guidecatheter 150 cm Phenom microcatheter Synchro 2 select standard microwire PIPELINE DEVICE USED 3.75 x 69m Pipeline Shield VESSELS CATHETERIZED Right internal carotid VESSELS STUDIED Right internal carotid artery, head (pre-embolization) Right internal carotid artery, head (during embolization) Right internal carotid artery, head (post-embolization) Right internal carotid artery, head (final control) Right common femoral PROCEDURAL NARRATIVE Under real-time fluoroscopy, the guide sheath was advanced over the select catheter and glidewire into the descending aorta. The select catheter was then advanced into the distal cervical right internal carotid artery. The guide sheath was then advanced into the proximal right internal carotid artery. The Select catheter was removed and the 058 guide catheter was coaxially introduced over the microcatheter and microwire. The microcatheter was then advanced under roadmap guidance into the right  middle cerebral artery. The guide catheter was then tracked over the microcatheter to its final position in the horizontal cavernous internal carotid. The microwire was then removed and the above pipeline device was introduced. Initially, a few mm of the pipeline device was deployed in the  middle cerebral artery. The microcatheter was then tracked back over the pipeline device to invert the PTFE flaps. The device was again deployed in the middle cerebral artery for a few mm. The device was then withdrawn back into the supraclinoid internal carotid artery. Angiogram was taken to confirm distal end of the device proximal to the ICA bifurcation. The remainder of the device was then deployed across the neck of the aneurysm extending into the proximal cavernous segment. The microcatheter was then tracked back over the pushing wire which was recaptured and removed intact. Angiogram was taken confirming good vessel wall apposition. The microcatheter was therefore removed and subsequent angiogram was taken immediately and in a delayed fashion. After review, the entire construct was withdrawn down and a final control angiogram was taken. The guide catheter and guide sheath were then synchronously removed without incident. FINDINGS: Right internal carotid artery, head (pre-embolization): Injection reveals the presence of a widely patent ICA, M1, and A1 segments and their branches. Again seen is the medially projecting right internal carotid artery aneurysm. The parenchymal and venous phases are normal. The venous sinuses are widely patent. Right internal carotid artery, head (during embolization): Injection reveals the presence of a widely patent ICA that leads to a patent ACA and MCA. Pipeline device appears to be opening well, and in good position, with the distal and proximal to the ICA bifurcation. No filling defects are identified. Right internal carotid artery, head (post-embolization): Injection taken immediately and  in a delayed fashion 10 minutes after deployment of the pipeline device reveal the presence of a widely patent ICA that leads to a patent ACA and MCA. Pipeline device appears to be in satisfactory position with good vessel wall apposition. There are no filling defects seen. There is significant contrast stasis within the aneurysm. Right internal carotid artery, head (final control): Injection reveals the presence of a widely patent ICA that leads to a patent ACA and MCA. No thrombus is visualized. Pipeline device is in stable position with significant contrast stasis within the aneurysm. No branch occlusions are noted with no perfusion deficits during capillary phase. Venous sinuses remain widely patent. Right femoral: Normal vessel. No significant atherosclerotic disease. Arterial sheath in adequate position. DISPOSITION: Upon completion of the study, the femoral sheath was removed and hemostasis obtained using a 7-Fr ExoSeal closure device. Good proximal and distal lower extremity pulses were documented upon achievement of hemostasis. The procedure was well tolerated and no early complications were observed. The patient was transferred to the postanesthesia care unit in stable hemodynamic condition. IMPRESSION: 1. Successful pipeline embolization of an previously incidentally discovered right internal carotid artery aneurysm, as described above. The preliminary results of this procedure were shared with the patient's family. Electronically Signed   By: Consuella Lose   On: 03/10/2022 11:57  ? ?MM DIAG BREAST TOMO BILATERAL ? ?Result Date: 03/02/2022 ?CLINICAL DATA:  Right lumpectomy.  Annual mammography. EXAM: DIGITAL DIAGNOSTIC BILATERAL MAMMOGRAM WITH TOMOSYNTHESIS AND CAD TECHNIQUE: Bilateral digital diagnostic mammography and breast tomosynthesis was performed. The images were evaluated with computer-aided detection. COMPARISON:  Previous exam(s). ACR Breast Density Category c: The breast tissue is  heterogeneously dense, which may obscure small masses. FINDINGS: The right lumpectomy site appears as expected. No suspicious masses, calcifications, or distortion identified in either breast. IMPRESSION: No mammogr

## 2022-03-22 NOTE — Progress Notes (Signed)
Physical Therapy Session Note ? ?Patient Details  ?Name: Amanda Rich ?MRN: [id number] ?Date of Birth: 1966-05-06 ? ?Today's Date: 03/22/2022 ?PT Individual Time: 1914-7829 ?PT Individual Time Calculation (min): 41 min  ? ?Short Term Goals: ?Week 1:  PT Short Term Goal 1 (Week 1): = LTG d/t ELOS ?Week 2:    ?Week 3:    ? ?Skilled Therapeutic Interventions/Progress Updates:  ? Pt initially oob in wc and agreeable to gait training on outdoor surfaces.  Pt wearing AFO. ?Transported via wc to outdoors. ?Gait >561f x1 including mult uneven sidewalk /challenges including declines, grades, transitions, curb all w/hurrycane, slowed cadence, cues to relax L UE, 2234fup long gradually inclined sidewalk.  Seated rest between efforts. Close supervision w/all.  Mild L knee wobbles but no buckling or extension thrust.   ?At end of session, Pt left oob in wc w/alarm belt set and needs in reach ? ?Therapy Documentation ?Precautions:  ?Precautions ?Precautions: Fall ?Restrictions ?Weight Bearing Restrictions: No ? ? ?Therapy/Group: Individual Therapy ?BaCallie FieldingPT ? ? ?BaJerrilyn Cairo3/29/2023, 12:56 PM  ?

## 2022-03-22 NOTE — Progress Notes (Signed)
Patient ID: Amanda Rich, female   DOB: 15-Mar-1966, 56 y.o.   MRN: [id number] ? ?Rolling Walker ordered through [employer]. OP referral sent to Neuro OP ?

## 2022-03-23 DIAGNOSIS — I639 Cerebral infarction, unspecified: Secondary | ICD-10-CM | POA: Diagnosis not present

## 2022-03-23 NOTE — Progress Notes (Signed)
Physical Therapy Session Note ? ?Patient Details  ?Name: Amanda Rich ?MRN: [id number] ?Date of Birth: March 29, 1966 ? ?Today's Date: 03/23/2022 ?PT Individual Time: 5852-7782 ?PT Individual Time Calculation (min): 56 min  ? ?Short Term Goals: ?Week 1:  PT Short Term Goal 1 (Week 1): = LTG d/t ELOS ? ?Skilled Therapeutic Interventions/Progress Updates:  ?   ?Pt presenting sitting EOB to start session - agreeable to PT tx and denies pain. Sit<>Stand to hurrycane mod I from EOB height. Ambulated from her room to ADL apartment, ~241f, supervision assist with hurrycane. In ADL apartment, completed furniture transfers mod I with hurrycane and bed mobility mod I as well with regular flat bed. Pt reports her bed at home is much higher, one that she has to climb into - plan for her is to sleep in the guest bedroom where the bed is standard height and similar to the one we did in the ADL apartment. In the main rehab gym, completed the BERG balance test with results listed below.  ? ?Patient demonstrates increased fall risk as noted by score of 42/56 on Berg Balance Scale.  (<36= high risk for falls, close to 100%; 37-45 significant >80%; 46-51 moderate >50%; 52-55 lower >25%). ? ?BERG score improved from 8/56 to 42/56, significant improvement. Educated pt on score and how score places her at increased falls risk.  ? ?Pt completed TUG using hurrycane and CGA for safety - completed in 32 seconds. Scores >13.5 seconds indicate increased falls risk. TUG score significantly improved from 1'54" with RW and minA on 3/22.  ? ?Pt ambulated back to her room with supervision and hurrycane - gait speed 0.356m indicative of household ambulator (cut off 0.10m33m.  ? ?Pt concluded session seated in w/c, call bell in reach, all needs met. Updated on her therapy schedule for the day. ? ?Therapy Documentation ?Precautions:  ?Precautions ?Precautions: Fall ?Restrictions ?Weight Bearing Restrictions: No ?General: ?   ? ?Balance: ?Balance ?Balance Assessed: Yes ?Standardized Balance Assessment ?Standardized Balance Assessment: Berg Balance Test ?BerMerrilee Janskylance Test ?Sit to Stand: Able to stand without using hands and stabilize independently ?Standing Unsupported: Able to stand 2 minutes with supervision (RLE wobbly after 90 seconds) ?Sitting with Back Unsupported but Feet Supported on Floor or Stool: Able to sit safely and securely 2 minutes ?Stand to Sit: Sits safely with minimal use of hands ?Transfers: Able to transfer safely, definite need of hands ?Standing Unsupported with Eyes Closed: Able to stand 10 seconds with supervision ?Standing Ubsupported with Feet Together: Able to place feet together independently and stand for 1 minute with supervision ?From Standing, Reach Forward with Outstretched Arm: Can reach forward >12 cm safely (5") ?From Standing Position, Pick up Object from Floor: Able to pick up shoe, needs supervision ?From Standing Position, Turn to Look Behind Over each Shoulder: Needs supervision when turning (needs supervision when turning L) ?Turn 360 Degrees: Needs close supervision or verbal cueing ?Standing Unsupported, Alternately Place Feet on Step/Stool: Able to complete 4 steps without aid or supervision (able to complete 8 steps with close supervision) ?Standing Unsupported, One Foot in Front: Able to place foot tandem independently and hold 30 seconds ?Standing on One Leg: Able to lift leg independently and hold > 10 seconds ?Total Score: 42/56 ? ?Therapy/Group: Individual Therapy ? ?Leonda Cristo P Danasia Baker ?03/23/2022, 7:59 AM  ?

## 2022-03-23 NOTE — Progress Notes (Signed)
Physical Therapy Discharge Summary ? ?Patient Details  ?Name: Amanda Rich ?MRN: [id number] ?Date of Birth: 1966/03/30 ? ?Today's Date: 03/23/2022 ?PT Individual Time: 1000-1055 ?PT Individual Time Calculation (min): 55 min  ? ?Patient has met 8 of 8 long term goals due to improved activity tolerance, improved balance, improved postural control, increased strength, ability to compensate for deficits, functional use of  left upper extremity and left lower extremity, improved awareness, and improved coordination. Patient to discharge at an ambulatory level Modified Independent with RW and L AFO. Patient's care partner is independent to provide the necessary physical assistance at discharge. Pt's family did not attend family education training, however pt has verbalized and demonstrated confidence with all tasks to ensure safe discharge home.  ? ?All goals met  ? ?Recommendation:  ?Patient will benefit from ongoing skilled PT services in outpatient setting to continue to advance safe functional mobility, address ongoing impairments in transfers, generalized strengthening and endurance, dynamic standing balance/coordination, gait training, NMR, and to minimize fall risk. ? ?Equipment: ?RW and L AFO ? ?Reasons for discharge: treatment goals met ? ?Patient/family agrees with progress made and goals achieved: Yes ? ?Today's Interventions: ?Received pt sitting in WC, pt agreeable to PT treatment, and denied any pain during session. Session with emphasis on discharge planning, functional mobility/transfers, generalized strengthening and endurance, dynamic standing balance/coordination, stair navigation, NMR, and gait training. Pt's RW delivered and therapist adjusted height and switched out rubber stoppers for ones that slide. Pt performed all transfers with RW and mod I throughout session. Chris from [place] present with new AFO. Pt ambulated 175f with RW and mod I to/from main therapy gym and navigated 12 steps with L  handrail and supervision using a lateral stepping technique. Pt able to pick up playing card from ground without AD and CGA - increased difficulty rising from squat position. Pt performed the following activities on [employer] with emphasis on L lateral weight shifting and coordination/control: ?-weight shifting training for 1 minute on level static with BUE support fading to 1 UE support with close supervision  ?-weight shifting training for 1 minute on level 10 with BUE support and CGA ?-limits of stability training on level static for 1 minute and 16 seconds with BUE support fading to 1 UE support and close supervision  ?-limits of stability training on level 10 for 1 minute and 11 seconds with BUE support fading to 1 UE support ?-catch baseball game for 5 minutes on level 11 with BUE support ?[facility]. Pt made mod I in room.  ? ?Of note pt plans on using RW for longer community distances and will purchase a hurrycane on her own to use around the house.  ? ?PT Discharge ?Precautions/Restrictions ?Precautions ?Precautions: Fall ?Restrictions ?Weight Bearing Restrictions: No ?Pain Interference ?Pain Interference ?Pain Effect on Sleep: 2. Occasionally ?Pain Interference with Therapy Activities: 0. Does not apply - I have not received rehabilitationtherapy in the past 5 days ?Pain Interference with Day-to-Day Activities: 1. Rarely or not at all ?Vision/Perception  ?Vision - History ?Ability to See in Adequate Light: 0 Adequate ?Perception ?Perception: Within Functional Limits ?Praxis ?Praxis: Impaired ?Praxis Impairment Details: Motor planning ?Praxis-Other Comments: increased time to complete tasks especially with functional gait  ?Cognition ?Overall Cognitive Status: Within Functional Limits for tasks assessed ?Arousal/Alertness: Awake/alert ?Orientation Level: Oriented X4 ?Memory: Appears intact ?Awareness: Appears intact ?Problem Solving: Appears  intact ?Safety/Judgment: Appears intact ?Sensation ?Sensation ?Light Touch: Impaired by gross  assessment ?Proprioception: Impaired by gross assessment ?Additional Comments: decreased sensation along LLE, pt reports hx of neuropathy and that lower L leg feels like it's "asleep" ?Coordination ?Gross Motor Movements are Fluid and Coordinated: No ?Fine Motor Movements are Fluid and Coordinated: No ?Coordination and Movement Description: mild uncoordination due to L hemiparesis, impaired motor planning, and decreased balance strategies ?Finger Nose Finger Test: WFL on RUE and mild dysmetria on LUE ?Heel Shin Test: [facility] on RLE slower but full ROM on LLE ?Motor  ?Motor ?Motor: Hemiplegia;Abnormal postural alignment and control ?Motor - Skilled Clinical Observations: mild uncoordination due to L hemiparesis, impaired motor planning, and decreased balance strategies  ?Mobility ?Bed Mobility ?Bed Mobility: Rolling Right;Rolling Left;Supine to Sit;Sit to Supine ?Rolling Right: Independent with assistive device ?Rolling Left: Independent with assistive device ?Supine to Sit: Independent with assistive device ?Sit to Supine: Independent with assistive device ?Transfers ?Transfers: Sit to Stand;Stand to [employer] Transfers ?Sit to Stand: Independent with assistive device ?Stand to Sit: Independent with assistive device ?Stand Pivot Transfers: Independent with assistive device ?Transfer (Assistive device): Rolling walker ?Locomotion  ?Gait ?Ambulation: Yes ?Gait Assistance: Independent with assistive device ?Gait Distance (Feet): 150 Feet ?Assistive device: Rolling walker ?Gait ?Gait: Yes ?Gait Pattern: Impaired ?Gait Pattern: Step-through pattern;Decreased dorsiflexion - left;Decreased weight shift to left;Right flexed knee in stance;Left flexed knee in stance ?Gait velocity: decreased and cautious ?Stairs / Additional Locomotion ?Stairs: Yes ?Stairs Assistance: Independent with assistive device ?Stair Management Technique:  One rail Left ?Number of Stairs: 12 ?Height of Stairs: 6 ?Ramp: Modified Independent  ?Curb: Contact Guard/Touching assist (no AD) ?Pick up small object from the floor assist level: Contact Guard/Touching assist ?Pick up small object from the floor assistive device: playing card without AD ?Wheelchair Mobility ?Wheelchair Mobility: No  ?Trunk/Postural Assessment  ?Cervical Assessment ?Cervical Assessment: Within Functional Limits ?Thoracic Assessment ?Thoracic Assessment: Within Functional Limits ?Lumbar Assessment ?Lumbar Assessment: Within Functional Limits ?Postural Control ?Postural Control: Deficits on evaluation ?Righting Reactions: delayed, but improved significantly since eval  ?Balance ?Balance ?Balance Assessed: Yes ?Static Sitting Balance ?Static Sitting - Balance Support: Feet supported;No upper extremity supported ?Static Sitting - Level of Assistance: 7: Independent ?Dynamic Sitting Balance ?Dynamic Sitting - Balance Support: Feet supported;No upper extremity supported ?Dynamic Sitting - Level of Assistance: 6: Modified independent (Device/Increase time) ?Static Standing Balance ?Static Standing - Balance Support: Bilateral upper extremity supported (RW) ?Static Standing - Level of Assistance: 6: Modified independent (Device/Increase time) ?Dynamic Standing Balance ?Dynamic Standing - Balance Support: Bilateral upper extremity supported (RW) ?Dynamic Standing - Level of Assistance: 6: Modified independent (Device/Increase time) ?Extremity Assessment  ?RLE Assessment ?RLE Assessment: Exceptions to [facility] ?RLE Strength ?Right Hip Flexion: 4+/5 ?Right Hip ABduction: 4-/5 ?Right Hip ADduction: 4-/5 ?Right Knee Flexion: 4+/5 ?Right Knee Extension: 4+/5 ?Right Ankle Dorsiflexion: 4+/5 ?Right Ankle Plantar Flexion: 4/5 ?LLE Assessment ?LLE Assessment: Exceptions to [facility] ?LLE Strength ?Left Hip Flexion: 3+/5 ?Left Hip ABduction: 3+/5 ?Left Hip ADduction: 3+/5 ?Left Knee Flexion: 4-/5 ?Left Knee Extension:  4-/5 ?Left Ankle Dorsiflexion: 2-/5 ? ?Blenda Nicely ?Becky Sax PT, DPT  ?03/23/2022, 7:50 AM ?

## 2022-03-23 NOTE — Progress Notes (Signed)
Inpatient Rehabilitation Care Coordinator ?Discharge Note  ? ?Patient Details  ?Name: Amanda Rich ?MRN: [id number] ?Date of Birth: 1966/04/20 ? ? ?Discharge location: Home ? ?Length of Stay: 10 Days ? ?Discharge activity level: MOD I ? ?Home/community participation: Significant other, Lamont ? ?Patient response MM:HWKGSU Literacy - How often do you need to have someone help you when you read instructions, pamphlets, or other written material from your doctor or pharmacy?: Never ? ?Patient response PJ:SRPRXY Isolation - How often do you feel lonely or isolated from those around you?: Never ? ?Services provided included: SW, Pharmacy, TR, RN, CM, SLP, PT, OT, RD, MD ? ?Financial Services:  ?Charity fundraiser Utilized: Medicaid ?  ? ?Choices offered to/list presented to: patient ? ?Follow-up services arranged:  ?Outpatient ?   ?Outpatient Servicies: [facility] ?  ?  ? ?Patient response to transportation need: ?Is the patient able to respond to transportation needs?: Yes ?In the past 12 months, has lack of transportation kept you from medical appointments or from getting medications?: No ?In the past 12 months, has lack of transportation kept you from meetings, work, or from getting things needed for daily living?: No ? ? ? ?Comments (or additional information): ? ?Patient/Family verbalized understanding of follow-up arrangements:  Yes ? ?Individual responsible for coordination of the follow-up plan: Patient ? ?Confirmed correct DME delivered: Dyanne Iha 03/23/2022   ? ?Dyanne Iha ?

## 2022-03-23 NOTE — Progress Notes (Signed)
Patient ID: Amanda Rich, female   DOB: 10/27/1966, 56 y.o.   MRN: [id number] ? ?OP referral faxed to [facility] ?

## 2022-03-23 NOTE — Progress Notes (Signed)
?                                                       PROGRESS NOTE ? ? ?Subjective/Complaints: ?No new complaints this morning ?Discussed that disability paperwork has been faxed ?She feels ready for d/c tomorrow ?Neuropathy stable- would like to try [place] outpatient ? ?ROS: Patient denies fever, rash, sore throat, blurred vision, dizziness, nausea, vomiting, diarrhea, cough, shortness of breath or chest pain, joint or back/neck pain, headache, or mood change. +neuropathic pain, +intermittent toe flexor spasticity ? ? ?Objective: ?  ?No results found. ?No results for input(s): WBC, HGB, HCT, PLT in the last 72 hours. ? ?No results for input(s): NA, K, CL, CO2, GLUCOSE, BUN, CREATININE, CALCIUM in the last 72 hours. ? ? ?Intake/Output Summary (Last 24 hours) at 03/23/2022 1236 ?Last data filed at 03/23/2022 4132 ?Gross per 24 hour  ?Intake 698 ml  ?Output --  ?Net 698 ml  ? ? ?  ? ?  ? ?Physical Exam: ?Vital Signs ?Blood pressure 109/67, pulse 63, temperature 97.6 ?F (36.4 ?C), resp. rate 18, height '5\' 5"'$  (1.651 m), weight 56.5 kg, last menstrual period 10/15/2017, SpO2 99 %. ?Constitutional: No distress . Vital signs reviewed. BMI 20.73 ?HEENT: NCAT, EOMI, oral membranes moist ?Neck: supple ?Cardiovascular: RRR without murmur. No JVD    ?Respiratory/Chest: CTA Bilaterally without wheezes or rales. Normal effort    ?GI/Abdomen: BS +, non-tender, non-distended ?Ext: no clubbing, cyanosis, or edema ?Psych: pleasant and cooperative  ?Skin: left anterior chest port site well healed ?Neurological:  ?   Mental Status: She is alert. Good medical historian ?   Comments: Alert and oriented x 3. Normal insight and awareness. Intact Memory. Normal language and speech. Mild left central 7 and hemifacial sensory loss. LUE and LLE 1/2 for LT and pain. RUE and RLE notable for stocking glove sensory loss in fingers/below ankle respectively.  Motor 5/5 RUE and RLE. LUE 3+ to 4/5 prox to distal. LLE 4- to 4/5 prox to distal with  decreased [place] and position sense. Motor and sensory exam stable. Left foot drop. Afo in place left foot ? ? ?Assessment/Plan: ?1. Functional deficits which require 3+ hours per day of interdisciplinary therapy in a comprehensive inpatient rehab setting. ?Physiatrist is providing close team supervision and 24 hour management of active medical problems listed below. ?Physiatrist and rehab team continue to assess barriers to discharge/monitor patient progress toward functional and medical goals ? ?Care Tool: ? ?Bathing ?   ?Body parts bathed by patient: Right arm, Left arm, Chest, Abdomen, Front perineal area, Buttocks, Right upper leg, Left upper leg, Right lower leg, Face, Left lower leg  ?   ?  ?  ?Bathing assist Assist Level: Independent with assistive device ?  ?  ?Upper Body Dressing/Undressing ?Upper body dressing   ?What is the patient wearing?: Pull over shirt, Bra ?   ?Upper body assist Assist Level: Independent with assistive device ?   ?Lower Body Dressing/Undressing ?Lower body dressing ? ? ?   ?What is the patient wearing?: Pants, Underwear/pull up ? ?  ? ?Lower body assist Assist for lower body dressing: Independent with assitive device ?   ? ?Toileting ?Toileting    ?Toileting assist Assist for toileting: Independent with assistive device ?  ?  ?Transfers ?Chair/bed transfer ? ?Transfers assist ?   ? ?  Chair/bed transfer assist level: Independent with assistive device ?Chair/bed transfer assistive device: Walker ?  ?Locomotion ?Ambulation ? ? ?Ambulation assist ? ?   ? ?Assist level: Independent with assistive device ?Assistive device: Walker-rolling ?Max distance: 174f  ? ?Walk 10 feet activity ? ? ?Assist ?   ? ?Assist level: Independent with assistive device ?Assistive device: Walker-rolling  ? ?Walk 50 feet activity ? ? ?Assist   ? ?Assist level: Independent with assistive device ?Assistive device: Walker-rolling  ? ? ?Walk 150 feet activity ? ? ?Assist Walk 150 feet activity did not occur:  Safety/medical concerns ? ?Assist level: Independent with assistive device ?Assistive device: Walker-rolling ?  ? ?Walk 10 feet on uneven surface  ?activity ? ? ?Assist Walk 10 feet on uneven surfaces activity did not occur: Safety/medical concerns ? ? ?Assist level: Independent with assistive device ?Assistive device: Walker-rolling  ? ?Wheelchair ? ? ? ? ?Assist Is the patient using a wheelchair?: No ?Type of Wheelchair: Manual ?Wheelchair activity did not occur: N/A ? ?Wheelchair assist level: Supervision/Verbal cueing ?Max wheelchair distance: 150  ? ? ?Wheelchair 50 feet with 2 turns activity ? ? ? ?Assist ? ?  ?Wheelchair 50 feet with 2 turns activity did not occur: N/A ? ? ?Assist Level: Supervision/Verbal cueing  ? ?Wheelchair 150 feet activity  ? ? ? ?Assist ? Wheelchair 150 feet activity did not occur: N/A ? ? ?Assist Level: Supervision/Verbal cueing  ? ?Blood pressure 109/67, pulse 63, temperature 97.6 ?F (36.4 ?C), resp. rate 18, height '5\' 5"'$  (1.651 m), weight 56.5 kg, last menstrual period 10/15/2017, SpO2 99 %. ? ? ? Medical Problem List and Plan: ?1. 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   ? ?            -patient may shower ?            -ELOS/Goals: 10 days, mod I goals with PT and OT ? Continue CIR therapies including PT, OT  ? Discussed that I will help her with her diasbility application ? D/c tomorrow ?2.  Antithrombotics: ?-DVT/anticoagulation:  Mechanical: Antiembolism stockings, thigh (TED hose) Bilateral lower extremities ?            -antiplatelet therapy: Aspirin 81 mg daily, Brilinta 90 mg twice daily ?3. 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 Discussed Qutenza and she wold like to try outpatient ?4. Depression: continue Celexa 10 mg nightly. Discussed amitriptyline but  she felt too sleepy with this in the past.  ? -seems to be pretty up beat and positive ?            -antipsychotic agents: N/A ?5. Neuropsych: This patient is capable of making decisions on her own behalf. ?6. Skin/Wound Care: Routine skin checks ?7. Fluids/Electrolytes/Nutrition: Routine in and outs with follow-up chemistries on admit. ?8.  History of right breast cancer status postlumpectomy chemo/radiation.   ?-Follow-up outpatient Dr.Kinard as well as Dr. VMickeal Skinner   ?-continue ARIMIDEX ?-no blood draws RUE ?*Pt has a survivorship care plan visit per onc scheduled 3/29--wanted to find out how she should go about keeping this appt/rescheduling/etc. Primary rehab team can follow up ?9.  Asthma/tobacco use.  Continue albuterol nebulizer PRN ?10. Hypoalbuminemia: recommended choosing protein rich foods, discussed nuts are a great source to include daily.  ?11. Hyperglycemia: discussed role of sugar and stress in elevating  CBGs, discussed that they have greatly improved, recommended avoiding added sugar in diet. No need for CBG checks. Prednisone discontinued.  ?12. Suboptimal potassium: supplement 41mq klor 3/23. Recommended adding nuts to daily diet. Monitor potassium outpatient.  ?13. Urinary frequency with dysuria: discontinue keflex since UC is with insignificant growth. ?14. Seasonal allergies: add claritin. continue cepacol spray for sore throat ?15. Left foot drop: AFO ordered ? ?LOS: ?9 days ?A FACE TO FACE EVALUATION WAS PERFORMED ? ?KMartha ClanP Savita Runner ?03/23/2022, 12:36 PM  ? ?  ?

## 2022-03-23 NOTE — Progress Notes (Signed)
Inpatient Rehabilitation Discharge Medication Review by a Pharmacist ? ?A complete drug regimen review was completed for this patient to identify any potential clinically significant medication issues. ? ?High Risk Drug Classes Is patient taking? Indication by Medication  ?Antipsychotic No   ?Anticoagulant No   ?Antibiotic No   ?Opioid Yes Vicodin as needed for pain  ?Antiplatelet Yes Brilinta, aspirin s/p stent  ?Hypoglycemics/insulin No   ?Vasoactive Medication No   ?Chemotherapy Yes, Oral Chemotherapy Arimidex for breast cancer  ?Other Yes Baclofen as needed for muscle spasms ?Gabapentin for pain ?Celexa for mood ?Inhalers for COPD, asthma ?Protonix for GERD  ? ? ? ?Type of Medication Issue Identified Description of Issue Recommendation(s)  ?Drug Interaction(s) (clinically significant) ?    ?Duplicate Therapy ?    ?Allergy ?    ?No Medication Administration End Date ?    ?Incorrect Dose ?    ?Additional Drug Therapy Needed ?    ?Significant med changes from prior encounter (inform family/care partners about these prior to discharge).    ?Other ?    ? ? ?Clinically significant medication issues were identified that warrant physician communication and completion of prescribed/recommended actions by midnight of the next day:  No ? ?Pharmacist comments: None ? ?Time spent performing this drug regimen review (minutes):  20 minutes ? ? ?Amanda Rich ?03/23/2022 9:00 AM ?

## 2022-03-23 NOTE — Progress Notes (Signed)
Occupational Therapy Discharge Summary ? ?Patient Details  ?Name: Amanda Rich ?MRN: [id number] ?Date of Birth: 19-Apr-1966 ? ?Patient has met 11 of 11 long term goals due to improved activity tolerance, improved balance, ability to compensate for deficits, functional use of  LEFT upper and LEFT lower extremity, improved awareness, and improved coordination.  Patient to discharge at overall Modified Independent level.  Patient's care partner is independent to provide intermittent assistance for higher level functional tasks at discharge, however pt is discharging at the mod I level.   ? ?Reasons goals not met: N/A ? ?Recommendation:  ?Patient will benefit from ongoing skilled OT services in outpatient setting to continue to advance functional skills in the area of BADL and iADL. ? ?Equipment: ?TTB ? ?Reasons for discharge: treatment goals met ? ?Patient/family agrees with progress made and goals achieved: Yes ? ?OT Discharge ?Precautions/Restrictions  ?Precautions ?Precautions: Fall ?Restrictions ?Weight Bearing Restrictions: No ?ADL ?ADL ?Eating: Independent ?Where Assessed-Eating: Wheelchair ?Grooming: Independent ?Where Assessed-Grooming: Sitting at sink ?Upper Body Bathing: Modified independent ?Where Assessed-Upper Body Bathing: Shower ?Lower Body Bathing: Modified independent ?Where Assessed-Lower Body Bathing: Shower ?Upper Body Dressing: Modified independent (Device) ?Where Assessed-Upper Body Dressing: Edge of bed ?Lower Body Dressing: Modified independent ?Where Assessed-Lower Body Dressing: Edge of bed ?Toileting: Modified independent ?Where Assessed-Toileting: Toilet ?Toilet Transfer: Modified independent ?Toilet Transfer Method: Ambulating ?Science writer: Grab bars, Raised toilet seat ?Tub/Shower Transfer: Modified independent ?Tub/Shower Transfer Method: Sit pivot, Ambulating ?Tub/Shower Equipment: Radio broadcast assistant ?Walk-In Shower Transfer: Modified independent ?Walk-In Shower Transfer  Method: Ambulating, Sit pivot ?Walk-In Shower Equipment: Radio broadcast assistant, Grab bars ?Vision ?Baseline Vision/History: 1 Wears glasses ?Patient Visual Report: No change from baseline ?Vision Assessment?: No apparent visual deficits ?Perception  ?Perception: Within Functional Limits ?Praxis ?Praxis: Impaired ?Praxis Impairment Details: Motor planning ?Praxis-Other Comments: increased time to complete tasks especially with functional gait ?Cognition ?Cognition ?Overall Cognitive Status: Within Functional Limits for tasks assessed ?Arousal/Alertness: Awake/alert ?Orientation Level: Person;Place;Situation ?Person: Oriented ?Place: Oriented ?Situation: Oriented ?Memory: Appears intact ?Awareness: Appears intact ?Problem Solving: Appears intact ?Safety/Judgment: Appears intact ?Brief Interview for Mental Status (BIMS) ?Repetition of Three Words (First Attempt): 3 ?Temporal Orientation: Year: Correct ?Temporal Orientation: Month: Accurate within 5 days ?Temporal Orientation: Day: Correct ?Recall: "Sock": Yes, after cueing ("something to wear") ?Recall: "Blue": Yes, no cue required ?Recall: "Bed": Yes, no cue required ?BIMS Summary Score: 14 ?Sensation ?Sensation ?Light Touch: Impaired by gross assessment ?Light Touch Impaired Details: Impaired LUE (palmar aspect minimally) ?Hot/Cold: Appears Intact ?Proprioception: Appears Intact ?Stereognosis: Appears Intact ?Coordination ?Gross Motor Movements are Fluid and Coordinated: No ?Fine Motor Movements are Fluid and Coordinated: No ?Finger Nose Finger Test: WFL on RUE and mild dysmetria on LUE ?Motor  ?Motor ?Motor: Hemiplegia;Abnormal postural alignment and control ?Motor - Skilled Clinical Observations: mild uncoordination due to L hemiparesis, impaired motor planning, and decreased balance strategies ?Mobility  ?Bed Mobility ?Bed Mobility: Rolling Right;Rolling Left;Supine to Sit;Sit to Supine ?Rolling Right: Independent with assistive device ?Rolling Left: Independent with  assistive device ?Supine to Sit: Independent with assistive device ?Sit to Supine: Independent with assistive device ?Transfers ?Sit to Stand: Independent with assistive device ?Stand to Sit: Independent with assistive device  ?Trunk/Postural Assessment  ?Cervical Assessment ?Cervical Assessment: Within Functional Limits ?Thoracic Assessment ?Thoracic Assessment: Within Functional Limits ?Lumbar Assessment ?Lumbar Assessment: Within Functional Limits ?Postural Control ?Postural Control: Deficits on evaluation ?Righting Reactions: delayed, but improved significantly since eval  ?Balance ?Balance ?Balance Assessed: Yes ?Static Sitting Balance ?Static Sitting - Balance Support: Feet supported;No upper  extremity supported ?Static Sitting - Level of Assistance: 7: Independent ?Dynamic Sitting Balance ?Dynamic Sitting - Balance Support: Feet supported;No upper extremity supported ?Dynamic Sitting - Level of Assistance: 6: Modified independent (Device/Increase time) ?Static Standing Balance ?Static Standing - Balance Support: Bilateral upper extremity supported (RW) ?Static Standing - Level of Assistance: 6: Modified independent (Device/Increase time) ?Dynamic Standing Balance ?Dynamic Standing - Balance Support: Bilateral upper extremity supported (RW) ?Dynamic Standing - Level of Assistance: 6: Modified independent (Device/Increase time) ?Extremity/Trunk Assessment ?RUE Assessment ?RUE Assessment: Within Functional Limits ?Active Range of Motion (AROM) Comments: WNL ?General Strength Comments: Grossly 4+/5 ?  ? ? ?Sufian Ravi E Pellegrino Kennard ?03/23/2022, 7:44 AM ?

## 2022-03-23 NOTE — Progress Notes (Signed)
Occupational Therapy Session Note ? ?Patient Details  ?Name: Amanda Rich ?MRN: [id number] ?Date of Birth: 23-Feb-1966 ? ?Today's Date: 03/23/2022 ?OT Individual Time: 6803-2122 ?OT Individual Time Calculation (min): 72 min  ? ? ?Short Term Goals: ?Week 1:  OT Short Term Goal 1 (Week 1): STG = LTG due to ELOS ? ?Skilled Therapeutic Interventions/Progress Updates:  ?Skilled OT intervention completed with focus on functional endurance, meal prep education, tub/shower transfer, d/c planning and BUE strengthening. Pt received seated in w/c agreeable to session. Pt was made mod I in room today. Pt ambulated from room <> ADL kitchen with mod I using hurrycane and with all transfers during session. With education provided, pt was able to return demonstrate transferring items/plates to/from table, washing dishes with BUE without AD for bilateral integration and dynamic balance, as well as reaching into fridge to retrieve low items. Education provided about safety precautions to consider and how to maximize safe reaching and independence. Completed tub/shower transfer with TTB at mod I level with pt able to demonstrate management of shower curtain to prevent water spillage. In room pt completed the following BUE strengthening exercises with HEP provided for continued strengthening once home:  ? ?(With red theraband) 10 reps ?Horizontal abduction ?Self-anchored shoulder flexion each arm ?Self-anchored bicep flexion each arm ?Alternating chest presses ?Shoulder external rotation ?Shoulder extension ?Shoulder diagonal pulls ? ?Pt required mutlimodal cues for form and technique throughout. Pt was left seated in w/c, with all needs in reach at end of session. ? ? ? ?Therapy Documentation ?Precautions:  ?Precautions ?Precautions: Fall ?Restrictions ?Weight Bearing Restrictions: No ? ?Pain: ?No c/o pain ? ? ? ?Therapy/Group: Individual Therapy ? ?Yanci Bachtell E Jaeshaun Riva ?03/23/2022, 7:39 AM ?

## 2022-03-24 ENCOUNTER — Encounter: Payer: Self-pay | Admitting: Hematology and Oncology

## 2022-03-24 ENCOUNTER — Other Ambulatory Visit (HOSPITAL_COMMUNITY): Payer: Self-pay

## 2022-03-24 MED ORDER — CITALOPRAM HYDROBROMIDE 10 MG PO TABS
10.0000 mg | ORAL_TABLET | Freq: Every day | ORAL | 3 refills | Status: DC
  Filled 2022-03-24 – 2022-06-03 (×2): qty 90, 90d supply, fill #0
  Filled 2022-08-31: qty 90, 90d supply, fill #1
  Filled 2022-11-27: qty 90, 90d supply, fill #2
  Filled 2023-03-17: qty 90, 90d supply, fill #3

## 2022-03-24 MED ORDER — HYDROCODONE-ACETAMINOPHEN 5-325 MG PO TABS
1.0000 | ORAL_TABLET | ORAL | 0 refills | Status: DC | PRN
  Filled 2022-03-24: qty 30, 5d supply, fill #0

## 2022-03-24 MED ORDER — SPIRIVA RESPIMAT 2.5 MCG/ACT IN AERS
2.0000 | INHALATION_SPRAY | Freq: Every day | RESPIRATORY_TRACT | 6 refills | Status: DC
  Filled 2022-03-24 – 2022-06-03 (×3): qty 4, 30d supply, fill #0
  Filled 2022-07-11: qty 4, 30d supply, fill #1
  Filled 2022-08-07: qty 4, 30d supply, fill #2
  Filled 2022-08-31: qty 4, 30d supply, fill #3
  Filled 2022-09-26: qty 4, 30d supply, fill #4
  Filled 2022-10-31: qty 4, 30d supply, fill #5

## 2022-03-24 MED ORDER — UMECLIDINIUM BROMIDE 62.5 MCG/ACT IN AEPB
1.0000 | INHALATION_SPRAY | Freq: Every day | RESPIRATORY_TRACT | 0 refills | Status: DC
  Filled 2022-03-24: qty 30, 30d supply, fill #0

## 2022-03-24 MED ORDER — ALBUTEROL SULFATE HFA 108 (90 BASE) MCG/ACT IN AERS
2.0000 | INHALATION_SPRAY | Freq: Four times a day (QID) | RESPIRATORY_TRACT | 0 refills | Status: DC | PRN
  Filled 2022-03-24: qty 18, 25d supply, fill #0

## 2022-03-24 MED ORDER — PANTOPRAZOLE SODIUM 40 MG PO TBEC
40.0000 mg | DELAYED_RELEASE_TABLET | Freq: Every day | ORAL | 0 refills | Status: DC
  Filled 2022-03-24: qty 30, 30d supply, fill #0

## 2022-03-24 MED ORDER — BACLOFEN 10 MG PO TABS
10.0000 mg | ORAL_TABLET | Freq: Three times a day (TID) | ORAL | 0 refills | Status: DC | PRN
Start: 2022-03-24 — End: 2022-07-12
  Filled 2022-03-24: qty 60, 20d supply, fill #0

## 2022-03-24 MED ORDER — BUDESONIDE-FORMOTEROL FUMARATE 80-4.5 MCG/ACT IN AERO
2.0000 | INHALATION_SPRAY | Freq: Two times a day (BID) | RESPIRATORY_TRACT | 12 refills | Status: DC
  Filled 2022-03-24 – 2022-06-03 (×3): qty 10.2, 30d supply, fill #0
  Filled 2022-07-11: qty 10.2, 30d supply, fill #1
  Filled 2022-08-07: qty 10.2, 30d supply, fill #2
  Filled 2022-08-31: qty 10.2, 30d supply, fill #3
  Filled 2022-09-26: qty 10.2, 30d supply, fill #4
  Filled 2022-10-31: qty 10.2, 30d supply, fill #5
  Filled 2022-11-27: qty 10.2, 30d supply, fill #6
  Filled 2022-12-22 – 2022-12-23 (×2): qty 10.2, 30d supply, fill #7
  Filled 2023-01-22: qty 10.2, 30d supply, fill #8
  Filled 2023-02-22: qty 10.2, 30d supply, fill #9
  Filled 2023-03-22: qty 10.2, 30d supply, fill #10

## 2022-03-24 MED ORDER — MAGNESIUM OXIDE 400 MG PO TABS
250.0000 mg | ORAL_TABLET | Freq: Every day | ORAL | 0 refills | Status: DC
  Filled 2022-03-24: qty 15, 30d supply, fill #0

## 2022-03-24 MED ORDER — ACETAMINOPHEN 325 MG PO TABS: 650.0000 mg | ORAL_TABLET | ORAL | Status: DC | PRN

## 2022-03-24 MED ORDER — TICAGRELOR 90 MG PO TABS
90.0000 mg | ORAL_TABLET | Freq: Two times a day (BID) | ORAL | 0 refills | Status: DC
Start: 2022-03-24 — End: 2022-06-05
  Filled 2022-03-24: qty 60, 30d supply, fill #0

## 2022-03-24 MED ORDER — CHOLECALCIFEROL 25 MCG (1000 UT) PO TABS
2000.0000 [IU] | ORAL_TABLET | Freq: Every day | ORAL | 0 refills | Status: DC
  Filled 2022-03-24: qty 90, 45d supply, fill #0

## 2022-03-24 MED ORDER — LORATADINE 10 MG PO TABS
10.0000 mg | ORAL_TABLET | Freq: Every day | ORAL | 0 refills | Status: DC
Start: 2022-03-24 — End: 2022-06-05
  Filled 2022-03-24: qty 30, 30d supply, fill #0

## 2022-03-24 MED ORDER — GABAPENTIN 300 MG PO CAPS
300.0000 mg | ORAL_CAPSULE | Freq: Two times a day (BID) | ORAL | 0 refills | Status: DC
Start: 2022-03-24 — End: 2022-06-05
  Filled 2022-03-24: qty 60, 30d supply, fill #0

## 2022-03-24 NOTE — TOC Benefit Eligibility Note (Signed)
Patient Advocate Encounter ?  ?Received notification that prior authorization for Incruse Ellipta 62.5MCG/ACT aerosol powder is required. ?  ?PA submitted on 03/24/2022 ?Key [id number] ?Status is pending ?   ? ? ? ?Lyndel Safe, CPhT ?Pharmacy Patient Advocate Specialist ?[place] Patient Advocate Team ?Direct Number: [phone]  Fax: [phone]  ?

## 2022-03-24 NOTE — Progress Notes (Signed)
Discharge instructions given per PA Dan. No further questions from pt/family. Meds returned to patient. Pt left per wheelchair to private vehicle with all belongings. No complications noted. ?Sheela Stack, LPN  ?

## 2022-03-24 NOTE — Consult Note (Signed)
? ?  [facility] CM Inpatient Consult ? ? ?03/24/2022 ? ?Arlyn Dunning ?February 10, 1966 ?[id number] ? ? ?Managed Medicaid:  [employer] ? ?Referral request for MM care management follow up for post rehab made. ? ?Natividad Brood, RN BSN CCM ?[facility] Liaison ? [phone] business mobile phone ?Toll free office [phone]  ?Fax number: [phone] ?[email or web address] ? ? ? ? ?

## 2022-03-24 NOTE — Progress Notes (Signed)
?                                                       PROGRESS NOTE ? ? ?Subjective/Complaints: ?No new issues. Excited about dc. Waiting on new AFO. Hanger arrived while I was in the room ? ?ROS: Patient denies fever, rash, sore throat, blurred vision, dizziness, nausea, vomiting, diarrhea, cough, shortness of breath or chest pain, joint or back/neck pain, headache, or mood change.  ? ? ?Objective: ?  ?No results found. ?No results for input(s): WBC, HGB, HCT, PLT in the last 72 hours. ? ?No results for input(s): NA, K, CL, CO2, GLUCOSE, BUN, CREATININE, CALCIUM in the last 72 hours. ? ?No intake or output data in the 24 hours ending 03/24/22 0931 ? ? ?  ? ?  ? ?Physical Exam: ?Vital Signs ?Blood pressure 109/72, pulse 88, temperature 98.1 ?F (36.7 ?C), resp. rate 18, height '5\' 5"'$  (1.651 m), weight 56.5 kg, last menstrual period 10/15/2017, SpO2 97 %. ?Constitutional: No distress . Vital signs reviewed. ?HEENT: NCAT, EOMI, oral membranes moist ?Neck: supple ?Cardiovascular: RRR without murmur. No JVD    ?Respiratory/Chest: CTA Bilaterally without wheezes or rales. Normal effort    ?GI/Abdomen: BS +, non-tender, non-distended ?Ext: no clubbing, cyanosis, or edema ?Psych: pleasant and cooperative  ?Skin: left anterior chest port site well healed ?Neurological:  ?   Mental Status: She is alert. Good medical historian ?   Comments: Alert and oriented x 3. Normal insight and awareness. Intact Memory. Normal language and speech. Mild left central 7 and hemifacial sensory loss. LUE and LLE 1/2 for LT and pain. RUE and RLE notable for stocking glove sensory loss in fingers/below ankle respectively.  Motor 5/5 RUE and RLE. LUE 3+ to 4/5 prox to distal. LLE 4- to 4/5 prox to distal with decreased [place] and position sense. Motor and sensory exam stable. Left foot drop. Afo in place left foot which is short on leg ? ? ?Assessment/Plan: ?1. Functional deficits which require 3+ hours per day of interdisciplinary therapy in a  comprehensive inpatient rehab setting. ?Physiatrist is providing close team supervision and 24 hour management of active medical problems listed below. ?Physiatrist and rehab team continue to assess barriers to discharge/monitor patient progress toward functional and medical goals ? ?Care Tool: ? ?Bathing ?   ?Body parts bathed by patient: Right arm, Left arm, Chest, Abdomen, Front perineal area, Buttocks, Right upper leg, Left upper leg, Right lower leg, Face, Left lower leg  ?   ?  ?  ?Bathing assist Assist Level: Independent with assistive device ?  ?  ?Upper Body Dressing/Undressing ?Upper body dressing   ?What is the patient wearing?: Pull over shirt, Bra ?   ?Upper body assist Assist Level: Independent with assistive device ?   ?Lower Body Dressing/Undressing ?Lower body dressing ? ? ?   ?What is the patient wearing?: Pants, Underwear/pull up ? ?  ? ?Lower body assist Assist for lower body dressing: Independent with assitive device ?   ? ?Toileting ?Toileting    ?Toileting assist Assist for toileting: Independent with assistive device ?  ?  ?Transfers ?Chair/bed transfer ? ?Transfers assist ?   ? ?Chair/bed transfer assist level: Independent with assistive device ?Chair/bed transfer assistive device: Walker ?  ?Locomotion ?Ambulation ? ? ?Ambulation assist ? ?   ? ?  Assist level: Independent with assistive device ?Assistive device: Walker-rolling ?Max distance: 145f  ? ?Walk 10 feet activity ? ? ?Assist ?   ? ?Assist level: Independent with assistive device ?Assistive device: Walker-rolling  ? ?Walk 50 feet activity ? ? ?Assist   ? ?Assist level: Independent with assistive device ?Assistive device: Walker-rolling  ? ? ?Walk 150 feet activity ? ? ?Assist Walk 150 feet activity did not occur: Safety/medical concerns ? ?Assist level: Independent with assistive device ?Assistive device: Walker-rolling ?  ? ?Walk 10 feet on uneven surface  ?activity ? ? ?Assist Walk 10 feet on uneven surfaces activity did not  occur: Safety/medical concerns ? ? ?Assist level: Independent with assistive device ?Assistive device: Walker-rolling  ? ?Wheelchair ? ? ? ? ?Assist Is the patient using a wheelchair?: No ?Type of Wheelchair: Manual ?Wheelchair activity did not occur: N/A ? ?Wheelchair assist level: Supervision/Verbal cueing ?Max wheelchair distance: 150  ? ? ?Wheelchair 50 feet with 2 turns activity ? ? ? ?Assist ? ?  ?Wheelchair 50 feet with 2 turns activity did not occur: N/A ? ? ?Assist Level: Supervision/Verbal cueing  ? ?Wheelchair 150 feet activity  ? ? ? ?Assist ? Wheelchair 150 feet activity did not occur: N/A ? ? ?Assist Level: Supervision/Verbal cueing  ? ?Blood pressure 109/72, pulse 88, temperature 98.1 ?F (36.7 ?C), resp. rate 18, height '5\' 5"'$  (1.651 m), weight 56.5 kg, last menstrual period 10/15/2017, SpO2 97 %. ? ? ? Medical Problem List and Plan: ?1. 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   ? ?            [facility]?2.  Antithrombotics: ?-DVT/anticoagulation:  Mechanical: Antiembolism stockings, thigh (TED hose) Bilateral lower extremities ?            -antiplatelet therapy: Aspirin 81 mg daily, Brilinta 90 mg twice daily ?3. 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 Discussed Qutenza and she wold like to try outpatient ?4. Depression: continue Celexa 10 mg nightly. Discussed amitriptyline but she felt too sleepy with this in the past.  ?  =seems to be doing well ?            -antipsychotic agents: N/A ?5. Neuropsych: This patient is capable of making decisions on her own behalf. ?6. Skin/Wound Care: Routine skin checks ?7. Fluids/Electrolytes/Nutrition: Routine in and outs with follow-up chemistries on admit. ?8.  History of right breast cancer status postlumpectomy  chemo/radiation.   ?-Follow-up outpatient Dr.Kinard as well as Dr. VMickeal Skinner   ?-continue ARIMIDEX ?-no blood draws RUE ?  ?9.  Asthma/tobacco use.  Continue albuterol nebulizer PRN ?10. Hypoalbuminemia: recommended choosing protein rich foods, discussed nuts are a great source to include daily.  ?11. Hyperglycemia: discussed role of sugar and stress in elevating CBGs, discussed that they have greatly improved ?- recommended avoiding added sugar in diet. No need for CBG checks. Prednisone discontinued.  ?12. Suboptimal potassium: supplement 443m klor 3/23. Recommended adding nuts to daily diet. Monitor potassium outpatient.  ?13. Urinary frequency with dysuria: discontinue keflex since UC is with insignificant growth. ?14. Seasonal allergies: add claritin. continue cepacol spray for sore throat ?15. Left foot drop: AFO ordered re fitted today ? ?LOS: ?10 days ?A FACE TO FACE EVALUATION WAS PERFORMED ? ?ZaMeredith Staggers3/31/2023, 9:31 AM  ? ?  ?

## 2022-03-25 DIAGNOSIS — Z419 Encounter for procedure for purposes other than remedying health state, unspecified: Secondary | ICD-10-CM | POA: Diagnosis not present

## 2022-03-28 ENCOUNTER — Ambulatory Visit: Payer: Medicaid Other | Attending: Physical Medicine and Rehabilitation | Admitting: Physical Therapy

## 2022-03-28 ENCOUNTER — Ambulatory Visit: Payer: Medicaid Other | Admitting: Occupational Therapy

## 2022-03-28 DIAGNOSIS — R2681 Unsteadiness on feet: Secondary | ICD-10-CM | POA: Insufficient documentation

## 2022-03-28 DIAGNOSIS — M25611 Stiffness of right shoulder, not elsewhere classified: Secondary | ICD-10-CM

## 2022-03-28 DIAGNOSIS — R2689 Other abnormalities of gait and mobility: Secondary | ICD-10-CM

## 2022-03-28 DIAGNOSIS — G8929 Other chronic pain: Secondary | ICD-10-CM | POA: Insufficient documentation

## 2022-03-28 DIAGNOSIS — R278 Other lack of coordination: Secondary | ICD-10-CM | POA: Insufficient documentation

## 2022-03-28 DIAGNOSIS — M25511 Pain in right shoulder: Secondary | ICD-10-CM | POA: Insufficient documentation

## 2022-03-28 DIAGNOSIS — M6281 Muscle weakness (generalized): Secondary | ICD-10-CM | POA: Insufficient documentation

## 2022-03-28 DIAGNOSIS — I671 Cerebral aneurysm, nonruptured: Secondary | ICD-10-CM | POA: Diagnosis not present

## 2022-03-28 NOTE — Therapy (Signed)
?OUTPATIENT PHYSICAL THERAPY NEURO EVALUATION ? ? ?Patient Name: Amanda Rich ?MRN: [id number] ?DOB:09-20-1966, 56 y.o., female ?Today's Date: 03/28/2022 ? ?PCP: Irene Pap, PA-C ?REFERRING PROVIDER: Bary Leriche, PA-C ? ? PT End of Session - 03/28/22 4481   ? ? Visit Number 1   ? Number of Visits 9   Plus eval  ? Date for PT Re-Evaluation 06/20/22   ? Authorization Type [place] Medicaid [employer]   ? Progress Note Due on Visit 10   ? PT Start Time [phone]   ? PT Stop Time [phone]   ? PT Time Calculation (min) 39 min   ? Equipment Utilized During Treatment Gait belt   ? Activity Tolerance Patient tolerated treatment well   ? Behavior During Therapy [facility] for tasks assessed/performed   ? ?  ?  ? ?  ? ? ?Past Medical History:  ?Diagnosis Date  ? Anemia   ? Asthma   ? Cancer [facility])   ? breast cancer  ? Depression   ? Dyspnea   ? Family history of breast cancer   ? Family history of liver cancer   ? History of radiation therapy 03/10/21-04/11/21  ? Right breast- Dr. Gery Pray  ? Pericarditis   ? Smoker   ? Umbilical hernia   ? ?Past Surgical History:  ?Procedure Laterality Date  ? ABLATION ON ENDOMETRIOSIS    ? BREAST BIOPSY Right   ? BREAST LUMPECTOMY WITH RADIOACTIVE SEED AND SENTINEL LYMPH NODE BIOPSY Right 02/02/2021  ? Procedure: RIGHT BREAST LUMPECTOMY WITH RADIOACTIVE SEED AND RIGHT SENTINEL LYMPH NODE MAPPING;  Surgeon: [name], MD;  Location: [place];  Service: General;  Laterality: Right;  ? GANGLION CYST EXCISION  12/25/2005  ? L wrist; [facility], [place]  ? IR ANGIO INTRA EXTRACRAN SEL INTERNAL CAROTID BILAT MOD SED  02/06/2022  ? IR ANGIO INTRA EXTRACRAN SEL INTERNAL CAROTID UNI R MOD SED  03/10/2022  ? IR ANGIO VERTEBRAL SEL VERTEBRAL BILAT MOD SED  02/06/2022  ? IR ANGIOGRAM FOLLOW UP STUDY  03/14/2022  ? IR IMAGING GUIDED PORT INSERTION  09/10/2020  ? IR NEURO EACH ADD'L AFTER BASIC UNI RIGHT (MS)  03/14/2022  ? IR TRANSCATH/EMBOLIZ  03/10/2022  ? RADIOLOGY WITH ANESTHESIA N/A 03/10/2022  ?  Procedure: Pipeline Embolization of Right Internal Carotid Artery Aneurysm;  Surgeon: Consuella Lose, MD;  Location: [place];  Service: Radiology;  Laterality: N/A;  ? TUBAL LIGATION    ? [facility]  ? UMBILICAL HERNIA REPAIR  12/26/2003  ? [place]  ? ?Patient Active Problem List  ? Diagnosis Date Noted  ? 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 Subcortical infarction ([place]) 03/14/2022  ? S/P aneurysm repair 03/10/2022  ? Cerebral aneurysm, nonruptured 03/10/2022  ? Pain in right shoulder 02/02/2022  ? Carotid aneurysm, right ([place]) 12/22/2021  ? Muscle spasm of right leg 12/08/2021  ? 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 Family history of breast cancer   ? Family history of liver cancer   ? Malignant neoplasm of 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, estrogen receptor positive ([place]) 08/25/2020  ? Abnormal chest CT 01/30/2019  ? History of pericarditis 01/30/2019  ? Pericarditis 01/22/2019  ? Chest pain 12/26/2018  ? History of endometrial ablation 01/21/2018  ? Family history of colon cancer 09/24/2017  ? Poor diet 01/24/2017  ? Weight loss, abnormal 01/24/2017  ? Smoker 09/22/2016  ? Mixed hyperlipidemia 08/07/2016  ? Irregular intermenstrual bleeding 08/07/2016  ? Depression  08/07/2016  ? 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 Pelvic congestion syndrome 08/07/2016  ? ? ?ONSET DATE: 03/24/2022 (referral) ? 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 ?THERAPY DIAG:  ?Unsteadiness on feet ? ?Muscle weakness (generalized) ? ?Other abnormalities of gait and mobility ? ?SUBJECTIVE:  ?                                                                                                                                                                                           ? ?SUBJECTIVE STATEMENT: ?Pt reports MRI found a R ICA aneurysm in January 2023 and  she had stint placed, had a small CVA during the surgery. Reports she has been moving "slowly" at home and feels "doubly fatigued" following chemo and brain surgery. Reports she drops things from her L hand frequently and has to be very careful when handling hot or fragile things. Has been primarily using Ambia in her home and community due to not "having free hands" when using RW.  ? ?Pt accompanied by: self ? ?PERTINENT HISTORY:  history of right breast cancer 1/2 years ago undergoing neoadjuvant chemotherapy which included Taxol.  Patient subsequently underwent lumpectomy right side axillary lymph node dissection.  She had negative margins of the lumpectomy and nodes were negative for carcinoma.  She did undergo subsequent radiation treatments maintained on Arimidex therapy followed by Dr. Gery Pray as well as Dr. Mickeal Skinner.  Unfortunately likely as result of Taxol she complained of peripheral neuropathy including muscle spasms which involve primarily the right leg. Left foot drop.  ? ?PAIN:  ?Are you having pain? No ? ?PRECAUTIONS: Fall ? ?WEIGHT BEARING RESTRICTIONS No ? ?FALLS: Has patient fallen in last 6 months? No ? ?LIVING ENVIRONMENT: ?Lives with: lives alone ?Lives in: House/apartment ?Stairs: Yes: External: 3 in front and 3 in back steps; front steps have no rails, back stairs have rails. Pt primarily uses front  ?Has following equipment at home: Quad cane small base, Walker - 2 wheeled, shower chair, and L AFO ? ?PLOF: Independent ? ?PATIENT GOALS "to get rid of this cane and not be clumsy"  ? ?OBJECTIVE:  ? ? ?COGNITION: ?Overall cognitive status: Within functional limits for tasks assessed ?  ?SENSATION: ?Light touch: Impaired LLE  ?Neuropathy in BUEs 2/2 chemo  ? ?COORDINATION: ?Heel to shin test: slowed on LLE but WNL bilat ? ? ?POSTURE: No Significant postural limitations ? ? ?MMT:   ? ?MMT Right ?03/28/2022 Left ?03/28/2022  ?Hip flexion 5 3+  ?Hip extension    ?Hip abduction 4+  4-  ?Hip adduction 4+  4-  ?Hip internal rotation    ?Hip external rotation    ?Knee flexion 5 4-  ?Knee extension 4 4-  ?Ankle dorsiflexion 4+ 3+  ?Ankle plantarflexion 4+ 3+  ?Ankle inversion    ?Ankle eversion    ?(Blank rows = not tested) ? ?TRANSFERS: ?Assistive device utilized: Single point cane  ?Sit to stand: CGA ?Stand to sit: CGA ?Chair to chair: CGA ? ?CURB:  ?Level of Assistance: CGA ?Assistive device utilized: Quad cane small base ?Curb Comments: Good use of SBQD and BLE sequencing  ? ?STAIRS: ? Level of Assistance: CGA ? Stair Negotiation Technique: Step to Pattern with Single Rail on Left and SPC on R ? Number of Stairs: 4  ? Height of Stairs: 6"  ?Comments: Pt ascends/descends slowly but uses proper BLE sequencing  ? ?GAIT: ?Gait pattern: step through pattern, decreased arm swing- Left, decreased step length- Right, decreased stance time- Left, decreased stride length, decreased ankle dorsiflexion- Right, decreased ankle dorsiflexion- Left, ataxic, lateral hip instability, narrow BOS, poor foot clearance- Right, and poor foot clearance- Left ?Distance walked: Various clinic distances  ?Assistive device utilized: Quad cane small base ?Level of assistance: CGA ?Comments: Without L AFO donned. Pt demonstrates significant lateral hip instability with diminished LUE arm swing and ataxia. Plan to assess gait pattern w/AFO donned next session. Pt reports difficulty walking in busy environment and frequently had to stop throughout session due to busy gym  ? ?FUNCTIONAL TESTs: Performed without L AFO donned ?5 times sit to stand: 28.40s without BUE support ?Timed up and go (TUG): 21.22s w/SPC ?Gait velocity: 36.75s w/SPC = 0.89 ft/s (household ambulator, recurrent fall risk) ?PATIENT SURVEYS:  ?FOTO Pt had not completed at end of eval  ? ?TODAY'S TREATMENT:  ?See clinical impression statement  ? ? ?PATIENT EDUCATION: ?Education details: POC, goals, safety at home  ?Person educated: Patient ?Education method: Explanation ?Education  comprehension: verbalized understanding and needs further education ? ? ?HOME EXERCISE PROGRAM: ?To be established next session  ? ? ? ?GOALS: ?Goals reviewed with patient? Yes ? ?SHORT TERM GOALS: [place]

## 2022-03-28 NOTE — Therapy (Signed)
?OUTPATIENT OCCUPATIONAL THERAPY NEURO EVALUATION ? ?Patient Name: Amanda Rich ?MRN: [id number] ?DOB:11/18/1966, 56 y.o., female ?Today's Date: 03/29/2022 ? ?PCP: Irene Pap, PA-C ?REFERRING PROVIDER: Dr. Ranell Patrick ? ? ? OT End of Session - 03/29/22 0737   ? ? Visit Number 1   ? Number of Visits 9   ? Date for OT Re-Evaluation 05/31/22   ? Authorization Type [employer]   ? Authorization Time Period 9 weeks, eval plus 8 visits- Pt has used 6 visits previously with PT this year   ? OT Start Time [phone]   ? OT Stop Time 0845   ? OT Time Calculation (min) 41 min   ? Activity Tolerance Patient tolerated treatment well   ? Behavior During Therapy [facility] for tasks assessed/performed   ? ?  ?  ? ?  ? ? ?Past Medical History:  ?Diagnosis Date  ? Anemia   ? Asthma   ? Cancer [facility])   ? breast cancer  ? Depression   ? Dyspnea   ? Family history of breast cancer   ? Family history of liver cancer   ? History of radiation therapy 03/10/21-04/11/21  ? Right breast- Dr. Gery Pray  ? Pericarditis   ? Smoker   ? Umbilical hernia   ? ?Past Surgical History:  ?Procedure Laterality Date  ? ABLATION ON ENDOMETRIOSIS    ? BREAST BIOPSY Right   ? BREAST LUMPECTOMY WITH RADIOACTIVE SEED AND SENTINEL LYMPH NODE BIOPSY Right 02/02/2021  ? Procedure: RIGHT BREAST LUMPECTOMY WITH RADIOACTIVE SEED AND RIGHT SENTINEL LYMPH NODE MAPPING;  Surgeon: [name], MD;  Location: [place];  Service: General;  Laterality: Right;  ? GANGLION CYST EXCISION  12/25/2005  ? L wrist; [facility], [place]  ? IR ANGIO INTRA EXTRACRAN SEL INTERNAL CAROTID BILAT MOD SED  02/06/2022  ? IR ANGIO INTRA EXTRACRAN SEL INTERNAL CAROTID UNI R MOD SED  03/10/2022  ? IR ANGIO VERTEBRAL SEL VERTEBRAL BILAT MOD SED  02/06/2022  ? IR ANGIOGRAM FOLLOW UP STUDY  03/14/2022  ? IR IMAGING GUIDED PORT INSERTION  09/10/2020  ? IR NEURO EACH ADD'L AFTER BASIC UNI RIGHT (MS)  03/14/2022  ? IR TRANSCATH/EMBOLIZ  03/10/2022  ? RADIOLOGY WITH ANESTHESIA N/A  03/10/2022  ? Procedure: Pipeline Embolization of Right Internal Carotid Artery Aneurysm;  Surgeon: Consuella Lose, MD;  Location: [place];  Service: Radiology;  Laterality: N/A;  ? TUBAL LIGATION    ? [facility]  ? UMBILICAL HERNIA REPAIR  12/26/2003  ? [place]  ? ?Patient Active Problem List  ? Diagnosis Date Noted  ? 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 Subcortical infarction ([place]) 03/14/2022  ? S/P aneurysm repair 03/10/2022  ? Cerebral aneurysm, nonruptured 03/10/2022  ? Pain in right shoulder 02/02/2022  ? Carotid aneurysm, right ([place]) 12/22/2021  ? Muscle spasm of right leg 12/08/2021  ? 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 Family history of breast cancer   ? Family history of liver cancer   ? Malignant neoplasm of 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, estrogen receptor positive ([place]) 08/25/2020  ? Abnormal chest CT 01/30/2019  ? History of pericarditis 01/30/2019  ? Pericarditis 01/22/2019  ? Chest pain 12/26/2018  ? History of endometrial ablation 01/21/2018  ? Family history of colon cancer 09/24/2017  ? Poor diet 01/24/2017  ? Weight loss, abnormal 01/24/2017  ? Smoker 09/22/2016  ? Mixed hyperlipidemia 08/07/2016  ? Irregular intermenstrual bleeding 08/07/2016  ? Depression 08/07/2016  ?  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 Pelvic congestion syndrome 08/07/2016  ? ? ?ONSET DATE: 03/10/22 ? ?REFERRING DIAG: 56 yo F was hospitalized 03/10/22 for s/p Pipeline embolization of RICA aneurysm.  Pt was d/c from rehab 03/24/22. PMH includes asthma, anemia, depression, breast cancer s/p chemo, pericarditis,  S/P axillary lymph node dissection for breast carcinoma 10 months ago; currently in remission from Rt breast cancer, hx of subacromial injection . Pt was receiving PT for R shoulder prior to her surgery. ? ?DIAGNOSTIC FINDINGS:  ?MRI IMPRESSION: ?1. Severe tendinosis of the supraspinatus tendon with a high-grade ?partial-thickness articular surface  tear and subcortical reactive ?marrow changes. ?2. Moderate tendinosis of the infraspinatus tendon. ?3. Mild tendinosis of the subscapularis tendon. ?4. Moderate tendinosis of the intra-articular portion of the long ?head of the biceps tendon. ?5. Mild subacromial/subdeltoid bursitis. ?  ?  ? ?THERAPY DIAG:  ?Muscle weakness (generalized) - Plan: Ot plan of care cert/re-cert ? ?Other abnormalities of gait and mobility - Plan: Ot plan of care cert/re-cert ? ?Unsteadiness on feet - Plan: Ot plan of care cert/re-cert ? ?Chronic right shoulder pain - Plan: Ot plan of care cert/re-cert ? ?Stiffness of right shoulder, not elsewhere classified - Plan: Ot plan of care cert/re-cert ? ?SUBJECTIVE:  ? ?SUBJECTIVE STATEMENT: ?Denies pain ?Pt accompanied by: self ? ?PERTINENT HISTORY: I63.9 (ICD-10-CM) - Cerebral infarction, unspecified ? ?PRECAUTIONS: Other: hx of breast CA ? ?WEIGHT BEARING RESTRICTIONS No ? ?PAIN:  ?Are you having pain? No ? ?FALLS: Has patient fallen in last 6 months? No ? ?LIVING ENVIRONMENT: ?Lives with: lives alone ?Lives in: House/apartment ? ?Has following equipment at home: Single point cane ? ?PLOF: Independent ? ?PATIENT GOALS to get left hand and leg working, to be more I, to drive ? ?OBJECTIVE:  ? ?HAND DOMINANCE: Right ? ?ADLs: ?Overall ADLs: Pt's family is providing supervision ?Transfers/ambulation related to ADLs: ?Eating: independent ?Grooming: mod I ?UB Dressing: supervision ?LB Dressing: supervision ?Toileting: mod I ?Bathing: supervision ?Tub Shower transfers: supervision ?Equipment: Shower seat with back ? ? ?IADLs: ?Shopping: needs assist with shopping ?Light housekeeping: assist heavier cleaning ?Meal Prep: using crockpot, air fryer ?Community mobility: uses large based cane ?Medication management: independent ?Financial management: independent ? ? ?MOBILITY STATUS: Independent uses large based single point cane ? ?POSTURE COMMENTS:  ?No Significant postural limitations ? ? ?ACTIVITY  TOLERANCE: ?Activity tolerance: 15 mins prior to fatigue ? ? ? ?UE ROM    ? ?Active ROM Right ?03/29/2022 Left ?03/29/2022  ?Shoulder flexion 140 140  ?Shoulder abduction  125  ?Shoulder adduction    ?Shoulder extension    ?Shoulder internal rotation    ?Shoulder external rotation    ?Elbow flexion    ?Elbow extension    ?Wrist flexion    ?Wrist extension    ?Wrist ulnar deviation    ?Wrist radial deviation    ?Wrist pronation    ?Wrist supination    ?(Blank rows = not tested) ? ? ?UE MMT:    ? ?MMT Right ?03/29/2022 Left ?03/29/2022  ?Shoulder flexion 3+/5 3+/5  ?Shoulder abduction    ?Shoulder adduction    ?Shoulder extension    ?Shoulder internal rotation    ?Shoulder external rotation    ?Middle trapezius    ?Lower trapezius    ?Elbow flexion 4/5 3+/5  ?Elbow extension 4/5 3/5  ?Wrist flexion    ?Wrist extension    ?Wrist ulnar deviation    ?Wrist radial deviation    ?Wrist pronation    ?Wrist  supination    ?(Blank rows = not tested) ? ?HAND FUNCTION: ?Grip strength: Right: NT lbs; Left: NT lbs ? ?COORDINATION: ?9 Hole Peg test: Right: 21.72 sec; Left: 42.94 sec ?Box and Blocks:  Right 75blocks, Left 37blocks ? ?SENSATION: ?Light touch: Impaired for LUE ? ?EDEMA: n/a ? ? ? ?COGNITION: ?Overall cognitive status: Within functional limits for tasks assessed ? ?VISION: ?Subjective report: blurriness ?Baseline vision: Wears glasses for reading only ? ? ?VISION ASSESSMENT: ?Not formally tested, pt reports a little blurriness, no diplopia ? ? ? ? ? ? ? ?OBSERVATIONS: Pt demonstrates some motor planning difficulties  ? ? ?TODAY'S TREATMENT:  ?N/A- eval only ? ? ?PATIENT EDUCATION: ?Education details: role of OT ?Person educated: Patient ?Education method: Explanation ?Education comprehension: verbalized understanding ? ? ?HOME EXERCISE PROGRAM: ?N/A ? ? ? ?GOALS: ?Goals reviewed with patient? Review next visit ? ?SHORT TERM GOALS: Target date: 04/26/2022 ? ?I with initial HEP ?Baseline:dependent ?Goal status: INITIAL ? ?2.  I  with energy conservation techniques and activity modification prn. ?Baseline: dependent ?Goal status: INITIAL ? ?3.  Pt will demonstrate improved LUE functional use as evidenced by increasing  LUE box/ blocks to

## 2022-03-29 ENCOUNTER — Encounter: Payer: Self-pay | Admitting: Occupational Therapy

## 2022-03-30 ENCOUNTER — Encounter: Payer: Self-pay | Admitting: Occupational Therapy

## 2022-03-30 ENCOUNTER — Encounter: Payer: Self-pay | Admitting: Physical Therapy

## 2022-03-30 ENCOUNTER — Ambulatory Visit: Payer: Medicaid Other | Admitting: Physical Therapy

## 2022-03-30 ENCOUNTER — Ambulatory Visit: Payer: Medicaid Other | Admitting: Occupational Therapy

## 2022-03-30 DIAGNOSIS — R278 Other lack of coordination: Secondary | ICD-10-CM | POA: Diagnosis not present

## 2022-03-30 DIAGNOSIS — R2689 Other abnormalities of gait and mobility: Secondary | ICD-10-CM | POA: Diagnosis not present

## 2022-03-30 DIAGNOSIS — R2681 Unsteadiness on feet: Secondary | ICD-10-CM

## 2022-03-30 DIAGNOSIS — M25511 Pain in right shoulder: Secondary | ICD-10-CM | POA: Diagnosis not present

## 2022-03-30 DIAGNOSIS — M6281 Muscle weakness (generalized): Secondary | ICD-10-CM | POA: Diagnosis not present

## 2022-03-30 DIAGNOSIS — M25611 Stiffness of right shoulder, not elsewhere classified: Secondary | ICD-10-CM | POA: Diagnosis not present

## 2022-03-30 DIAGNOSIS — I671 Cerebral aneurysm, nonruptured: Secondary | ICD-10-CM

## 2022-03-30 DIAGNOSIS — G8929 Other chronic pain: Secondary | ICD-10-CM | POA: Diagnosis not present

## 2022-03-30 NOTE — Therapy (Addendum)
?OUTPATIENT OCCUPATIONAL THERAPY TREATMENT NOTE ? ? ?Patient Name: Amanda Rich ?MRN: [id number] ?DOB:[birth year], 56 y.o., female ?Today's Date: 03/30/2022 ? ?PCP: Irene Pap, PA-C ?REFERRING PROVIDER: Dr. Ranell Patrick ? ?END OF SESSION:  ? OT End of Session - 03/30/22 0815   ? ? Visit Number 2   ? Number of Visits 9   ? Date for OT Re-Evaluation 05/31/22   ? Authorization Type [employer]   ? Authorization Time Period 9 weeks, eval plus 8 visits- Pt has used 6 visits previously with PT this year   ? OT Start Time [phone]   ? OT Stop Time 0845   ? OT Time Calculation (min) 42 min   ? Activity Tolerance Patient tolerated treatment well   ? Behavior During Therapy [facility] for tasks assessed/performed   ? ?  ?  ? ?  ? ? ?Past Medical History:  ?Diagnosis Date  ? Anemia   ? Asthma   ? Cancer [facility])   ? breast cancer  ? Depression   ? Dyspnea   ? Family history of breast cancer   ? Family history of liver cancer   ? History of radiation therapy 03/10/21-04/11/21  ? Right breast- Dr. Gery Pray  ? Pericarditis   ? Smoker   ? Umbilical hernia   ? ?Past Surgical History:  ?Procedure Laterality Date  ? ABLATION ON ENDOMETRIOSIS    ? BREAST BIOPSY Right   ? BREAST LUMPECTOMY WITH RADIOACTIVE SEED AND SENTINEL LYMPH NODE BIOPSY Right 02/02/2021  ? Procedure: RIGHT BREAST LUMPECTOMY WITH RADIOACTIVE SEED AND RIGHT SENTINEL LYMPH NODE MAPPING;  Surgeon: [name], MD;  Location: [place];  Service: General;  Laterality: Right;  ? GANGLION CYST EXCISION  12/25/2005  ? L wrist; [facility], [place]  ? IR ANGIO INTRA EXTRACRAN SEL INTERNAL CAROTID BILAT MOD SED  02/06/2022  ? IR ANGIO INTRA EXTRACRAN SEL INTERNAL CAROTID UNI R MOD SED  03/10/2022  ? IR ANGIO VERTEBRAL SEL VERTEBRAL BILAT MOD SED  02/06/2022  ? IR ANGIOGRAM FOLLOW UP STUDY  03/14/2022  ? IR IMAGING GUIDED PORT INSERTION  09/10/2020  ? IR NEURO EACH ADD'L AFTER BASIC UNI RIGHT (MS)  03/14/2022  ? IR TRANSCATH/EMBOLIZ  03/10/2022  ? RADIOLOGY WITH  ANESTHESIA N/A 03/10/2022  ? Procedure: Pipeline Embolization of Right Internal Carotid Artery Aneurysm;  Surgeon: Consuella Lose, MD;  Location: [place];  Service: Radiology;  Laterality: N/A;  ? TUBAL LIGATION    ? [facility]  ? UMBILICAL HERNIA REPAIR  12/26/2003  ? [place]  ? ?Patient Active Problem List  ? Diagnosis Date Noted  ? 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 Subcortical infarction ([place]) 03/14/2022  ? S/P aneurysm repair 03/10/2022  ? Cerebral aneurysm, nonruptured 03/10/2022  ? Pain in right shoulder 02/02/2022  ? Carotid aneurysm, right ([place]) 12/22/2021  ? Muscle spasm of right leg 12/08/2021  ? Chemotherapy-induced neuropathy ([place]) 11/14/2021  ? Family history of breast cancer   ? Family history of liver cancer   ? Malignant neoplasm of 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, estrogen receptor positive ([place]) 08/25/2020  ? Abnormal chest CT 01/30/2019  ? History of pericarditis 01/30/2019  ? Pericarditis 01/22/2019  ? Chest pain 12/26/2018  ? History of endometrial ablation 01/21/2018  ? Family history of colon cancer 09/24/2017  ? Poor diet 01/24/2017  ? Weight loss, abnormal 01/24/2017  ? Smoker 09/22/2016  ? Mixed hyperlipidemia 08/07/2016  ? Irregular intermenstrual bleeding 08/07/2016  ?  Depression 08/07/2016  ? Fat necrosis of peritoneum ([place]) 08/07/2016  ? Pelvic congestion syndrome 08/07/2016  ? ? ?ONSET DATE: 03/10/22 ? ?REFERRING DIAG: I63.9 (ICD-10-CM) - Cerebral infarction, unspecified, ?56 yo F was hospitalized 03/10/22 for s/p Pipeline embolization of RICA aneurysm.  Pt was d/c from rehab 03/24/22. PMH includes asthma, anemia, depression, breast cancer s/p chemo, pericarditis,  S/P axillary lymph node dissection for breast carcinoma 10 months ago; currently in remission from Rt breast cancer, hx of subacromial injection . Pt was receiving PT for R shoulder prior to her surgery. ?  ?DIAGNOSTIC FINDINGS: RUE ?MRI IMPRESSION: ?1. Severe tendinosis of the  supraspinatus tendon with a high-grade ?partial-thickness articular surface tear and subcortical reactive ?marrow changes. ?2. Moderate tendinosis of the infraspinatus tendon. ?3. Mild tendinosis of the subscapularis tendon. ?4. Moderate tendinosis of the intra-articular portion of the long ?head of the biceps tendon. ?5. Mild subacromial/subdeltoid bursitis. ?  ?  ? ?THERAPY DIAG:  ?Muscle weakness (generalized) ? ?Other abnormalities of gait and mobility ? ?Unsteadiness on feet ? ?Chronic right shoulder pain ? ?Stiffness of right shoulder, not elsewhere classified ? ?Cerebral aneurysm ? ? ?PERTINENT HISTORY: PMH includes asthma, anemia, depression, breast cancer s/p chemo, pericarditis,  S/P axillary lymph node dissection for breast carcinoma 10 months ago; currently in remission from Rt breast cancer, hx of subacromial injection . Pt was receiving PT for R shoulder prior to her surgery. ? ?PRECAUTIONS: hx of breast CA, fall risk , see MRI for R shoulder above, pt was receiving PT for shoulder prior to CVa ? ?SUBJECTIVE: Pt denies pain ? ?PAIN:  ?Are you having pain? No ? ? ? ? ? ?TODAY'S TREATMENT: Cane exercises for shoulder flexion, chest press and abduction. ?Pt brought in her HEP from the hospital, therapist reviewed her theraband exercises and mad suggestions or modifications and wrote them on pt handouts.Pt returned demonstration with min v.c initally. ? ? ?PATIENT EDUCATION: ?Education details:  theraband HEP review,  ?Person educated: Patient ?Education method: Explanation, demonstration ?Education comprehension: verbalized understanding, returned demonstration ? ? ? ?GOALS: ? ? ?SHORT TERM GOALS: Target date: 04/26/2022 ? ?I with initial HEP ?Baseline:dependent ?Goal status: INITIAL ? ?2.  I with energy conservation techniques and activity modification prn. ?Baseline: dependent ?Goal status: INITIAL ? ?3.  Pt will demonstrate improved LUE functional use as evidenced by increasing  LUE box/ blocks to 41  blocks or greater ?Baseline: Box and Blocks:  Right 75 blocks, Left 37 blocks ?Goal status: INITIAL ? ?4.  I with all basic ADLs ?Baseline: supervision ?Goal status: INITIAL ? ?5.  Pt will perform stove top cooking with supervision, min v.c demonstrating good safety awareness. ?Baseline: currently dependent for stovetop cooking, using air fryer ?Goal status: INITIAL ? ? ?LONG TERM GOALS: Target date: 05/31/22 ? ?I with updated HEP ?Baseline: dependent ?Goal status: INITIAL ? ?2.  Pt will demonstrate improved fine motor coordination for ADLS as evidenced by decreasing 9 hole peg test by 3 secs. ?Baseline: 9 Hole Peg test: Right: 21.72 sec; Left: 42.94 sec ?Goal status: INITIAL ? ?3.  Pt will increase left grip strength by 5 lbs for increased functional use during ADLs. ?Baseline: R 70.7 lbs, L 45.5 lbs ?Goal status: INITIAL ? ?4.  Pt will perform stovetop cooking modified independently demonstrating good safety awareness. ?Baseline: dependent ?Goal status: INITIAL ? ?5.  Pt will perform moderate level home management modified independently demonstrating ?good safety awareness. ?Baseline: performs only very light home management activities. ?Goal status: INITIAL ? ? ? ?  ASSESSMENT: ? ?CLINICAL IMPRESSION: ?Pt is progressing towards goals with HEP. ? ?PERFORMANCE DEFICITS in functional skills including ADLs, IADLs, coordination, dexterity, sensation, ROM, strength, pain, flexibility, FMC, GMC, balance, endurance, decreased knowledge of precautions, decreased knowledge of use of DME, and UE functional use, which impedes performance of ADLS/ IADLS. ? ?IMPAIRMENTS are limiting patient from ADLs, IADLs, work, leisure, and social participation.  ? ?COMORBIDITIES may have co-morbidities  that affects occupational performance. Patient will benefit from skilled OT to address above impairments and improve overall function. ? ?MODIFICATION OR ASSISTANCE TO COMPLETE EVALUATION: No modification of tasks or assist necessary to  complete an evaluation. ? ?OT OCCUPATIONAL PROFILE AND HISTORY: Problem focused assessment: Including review of records relating to presenting problem. ? ?CLINICAL DECISION MAKING: LOW - limited treatment options, n

## 2022-03-30 NOTE — Patient Instructions (Signed)
Access Code: [email or web address] ?URL: [email or web address] ?Date: 03/30/2022 ?Prepared by: Elease Etienne ? ?Exercises ?- Staggered Sit-to-Stand  - 1 x daily - 7 x weekly - 2 sets - 8 reps ?- Sit to Stand with Resistance Around Legs  - 1 x daily - 7 x weekly - 2 sets - 8 reps ?- Standing Tandem Balance with Counter Support  - 1 x daily - 7 x weekly - 2 sets - 2 reps - 30seconds hold ?- Standing Single Leg Stance with Counter Support  - 1 x daily - 7 x weekly - 2 sets - 2 reps - 30 seconds hold ?

## 2022-03-30 NOTE — Therapy (Signed)
?OUTPATIENT PHYSICAL THERAPY TREATMENT NOTE ? ? ?Patient Name: Amanda Rich ?MRN: [id number] ?DOB:1966/01/12, 56 y.o., female ?Today's Date: 03/31/2022 ? ?PCP: Irene Pap, PA-C ?REFERRING PROVIDER: Bary Leriche, PA-C ? ?END OF SESSION:  ? PT End of Session - 03/30/22 0850   ? ? Visit Number 2   ? Number of Visits 9   Plus eval  ? Date for PT Re-Evaluation 06/20/22   ? Authorization Type [place] Medicaid Wellcare   ? Progress Note Due on Visit 10   ? PT Start Time 0848   ? PT Stop Time 0931   ? PT Time Calculation (min) 43 min   ? Equipment Utilized During Treatment Gait belt   ? Activity Tolerance Patient tolerated treatment well   ? Behavior During Therapy [facility] for tasks assessed/performed   ? ?  ?  ? ?  ? ? ?Past Medical History:  ?Diagnosis Date  ? Anemia   ? Asthma   ? Cancer [facility])   ? breast cancer  ? Depression   ? Dyspnea   ? Family history of breast cancer   ? Family history of liver cancer   ? History of radiation therapy 03/10/21-04/11/21  ? Right breast- Dr. Gery Pray  ? Pericarditis   ? Smoker   ? Umbilical hernia   ? ?Past Surgical History:  ?Procedure Laterality Date  ? ABLATION ON ENDOMETRIOSIS    ? BREAST BIOPSY Right   ? BREAST LUMPECTOMY WITH RADIOACTIVE SEED AND SENTINEL LYMPH NODE BIOPSY Right 02/02/2021  ? Procedure: RIGHT BREAST LUMPECTOMY WITH RADIOACTIVE SEED AND RIGHT SENTINEL LYMPH NODE MAPPING;  Surgeon: [name], MD;  Location: [place];  Service: General;  Laterality: Right;  ? GANGLION CYST EXCISION  12/25/2005  ? L wrist; [facility], [place]  ? IR ANGIO INTRA EXTRACRAN SEL INTERNAL CAROTID BILAT MOD SED  02/06/2022  ? IR ANGIO INTRA EXTRACRAN SEL INTERNAL CAROTID UNI R MOD SED  03/10/2022  ? IR ANGIO VERTEBRAL SEL VERTEBRAL BILAT MOD SED  02/06/2022  ? IR ANGIOGRAM FOLLOW UP STUDY  03/14/2022  ? IR IMAGING GUIDED PORT INSERTION  09/10/2020  ? IR NEURO EACH ADD'L AFTER BASIC UNI RIGHT (MS)  03/14/2022  ? IR TRANSCATH/EMBOLIZ  03/10/2022  ? RADIOLOGY WITH ANESTHESIA  N/A 03/10/2022  ? Procedure: Pipeline Embolization of Right Internal Carotid Artery Aneurysm;  Surgeon: Consuella Lose, MD;  Location: [place];  Service: Radiology;  Laterality: N/A;  ? TUBAL LIGATION    ? [facility]  ? UMBILICAL HERNIA REPAIR  12/26/2003  ? [place]  ? ?Patient Active Problem List  ? Diagnosis Date Noted  ? 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 Subcortical infarction ([place]) 03/14/2022  ? S/P aneurysm repair 03/10/2022  ? Cerebral aneurysm, nonruptured 03/10/2022  ? Pain in right shoulder 02/02/2022  ? Carotid aneurysm, right ([place]) 12/22/2021  ? Muscle spasm of right leg 12/08/2021  ? Chemotherapy-induced neuropathy ([place]) 11/14/2021  ? Family history of breast cancer   ? Family history of liver cancer   ? Malignant neoplasm of 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, estrogen receptor positive ([place]) 08/25/2020  ? Abnormal chest CT 01/30/2019  ? History of pericarditis 01/30/2019  ? Pericarditis 01/22/2019  ? Chest pain 12/26/2018  ? History of endometrial ablation 01/21/2018  ? Family history of colon cancer 09/24/2017  ? Poor diet 01/24/2017  ? Weight loss, abnormal 01/24/2017  ? Smoker 09/22/2016  ? Mixed hyperlipidemia 08/07/2016  ? Irregular intermenstrual bleeding  08/07/2016  ? Depression 08/07/2016  ? Fat necrosis of peritoneum ([place]) 08/07/2016  ? Pelvic congestion syndrome 08/07/2016  ? ? 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 ?THERAPY DIAG:  ?Muscle weakness (generalized) ? ?Other abnormalities of gait and mobility ? ?Unsteadiness on feet ? ?PERTINENT HISTORY: history of right breast cancer 1/2 years ago undergoing neoadjuvant chemotherapy which included Taxol.  Patient subsequently underwent lumpectomy right side axillary lymph node dissection.  She had negative margins of the lumpectomy and nodes were negative for carcinoma.  She did undergo  subsequent radiation treatments maintained on Arimidex therapy followed by Dr. Gery Pray as well as Dr. Mickeal Skinner.  Unfortunately likely as result of Taxol she complained of peripheral neuropathy including muscle spasms which involve primarily the right leg. Left foot drop.  ? ?PRECAUTIONS: fall ? ?SUBJECTIVE: She has been doing her exercises from the hospital at home and her daughters put her gait belt on her and help her use her Gazelle stepper.  She states she is feeling her left leg a little more.  She has been using the cane for everything at home without falls. ? ?PAIN:  ?Are you having pain? No ? ?TODAY'S TREATMENT: ?Assessed Gait velocity w/ L AFO donned:  21.31 sec = 1.55 ft/sec compared to 36.75sec = 0.89 ft/sec w/o L AFO ?Assessed BERG: ? [facility] PT Assessment - 03/30/22 0900   ? ?  ? Balance  ? Balance Assessed Yes   ?  ? Standardized Balance Assessment  ? Standardized Balance Assessment Berg Balance Test   ?  ? Berg Balance Test  ? Sit to Stand Able to stand without using hands and stabilize independently   ? Standing Unsupported Able to stand safely 2 minutes   ? Sitting with Back Unsupported but Feet Supported on Floor or Stool Able to sit safely and securely 2 minutes   ? Stand to Sit Sits safely with minimal use of hands   ? Transfers Able to transfer with verbal cueing and /or supervision   ? Standing Unsupported with Eyes Closed Able to stand 10 seconds with supervision   ? Standing Unsupported with Feet Together Able to place feet together independently and stand 1 minute safely   ? From Standing, Reach Forward with Outstretched Arm Can reach forward >5 cm safely (2")   ? From Standing Position, Pick up Object from Floor Able to pick up shoe, needs supervision   ? From Standing Position, Turn to Look Behind Over each Shoulder Looks behind one side only/other side shows less weight shift   L side with less weight shift  ? Turn 360 Degrees Able to turn 360 degrees safely but slowly   ? Standing  Unsupported, Alternately Place Feet on Step/Stool Able to complete 4 steps without aid or supervision   ? Standing Unsupported, One Foot in Front Able to take small step independently and hold 30 seconds   ? Standing on One Leg Able to lift leg independently and hold 5-10 seconds   Pt needs to hold L thigh w/ BUE in order to stand on this leg, but able to lift R leg x10sec.  Very shaky  ? Total Score 42   ? Berg comment: Significant fall risk   ? ?  ?  ? ?  ? ?Initiate HEP.  See below. ? ?Access Code: [email or web address] ?URL: [email or web address] ?Date: 03/30/2022 ?Prepared by: Elease Etienne ? ?Exercises ?- Staggered Sit-to-Stand  -  1 x daily - 7 x weekly - 2 sets - 8 reps ?- Sit to Stand with Resistance Around Legs  - 1 x daily - 7 x weekly - 2 sets - 8 reps ?- Standing Tandem Balance with Counter Support  - 1 x daily - 7 x weekly - 2 sets - 2 reps - 30seconds hold ?- Standing Single Leg Stance with Counter Support  - 1 x daily - 7 x weekly - 2 sets - 2 reps - 30 seconds hold ? ?TODAY'S TREATMENT:  ?See clinical impression statement  ?  ?  ?PATIENT EDUCATION: ?Education details: POC, goals, safety at home  ?Person educated: Patient ?Education method: Explanation ?Education comprehension: verbalized understanding and needs further education ?  ?  ?HOME EXERCISE PROGRAM: ?To be established next session  ?  ?  ?  ?GOALS: ?Goals reviewed with patient? Yes ?  ?SHORT TERM GOALS: Target date: 04/25/2022 ?  ?Pt will be independent with initial HEP for improved strength, balance, transfers and gait. ?  ?Baseline: ?Goal status: INITIAL ?  ?2.  Pt will improve 5 x STS to less than or equal to 21 seconds without BUE support to demonstrate improved functional strength and transfer efficiency.   ?Baseline: 28.4s without BUE support ?Goal status: INITIAL ?  ?3.  Update goal when Berg performed and write LTG as appropriate  ?Baseline:  ?Goal status: INITIAL ?  ?4.  Pt will improve gait velocity to at least 1.4 ft/s with  LRAD with or without L AFO for improved gait efficiency and performance at limited community ambulator level  ?  ?Baseline: 0.38f/s with SBQD and no AFO ?Goal status: INITIAL ?  ?  ?LONG TERM GOALS: Target da

## 2022-04-04 ENCOUNTER — Ambulatory Visit: Payer: Medicaid Other | Admitting: Physical Therapy

## 2022-04-04 ENCOUNTER — Encounter: Payer: Medicaid Other | Admitting: Occupational Therapy

## 2022-04-05 ENCOUNTER — Other Ambulatory Visit: Payer: Self-pay

## 2022-04-05 ENCOUNTER — Ambulatory Visit: Payer: Medicaid Other | Admitting: Physical Therapy

## 2022-04-05 DIAGNOSIS — G8929 Other chronic pain: Secondary | ICD-10-CM | POA: Diagnosis not present

## 2022-04-05 DIAGNOSIS — R2689 Other abnormalities of gait and mobility: Secondary | ICD-10-CM

## 2022-04-05 DIAGNOSIS — M6281 Muscle weakness (generalized): Secondary | ICD-10-CM

## 2022-04-05 DIAGNOSIS — M25511 Pain in right shoulder: Secondary | ICD-10-CM | POA: Diagnosis not present

## 2022-04-05 DIAGNOSIS — R2681 Unsteadiness on feet: Secondary | ICD-10-CM

## 2022-04-05 DIAGNOSIS — R278 Other lack of coordination: Secondary | ICD-10-CM | POA: Diagnosis not present

## 2022-04-05 DIAGNOSIS — M25611 Stiffness of right shoulder, not elsewhere classified: Secondary | ICD-10-CM | POA: Diagnosis not present

## 2022-04-05 DIAGNOSIS — I671 Cerebral aneurysm, nonruptured: Secondary | ICD-10-CM | POA: Diagnosis not present

## 2022-04-05 NOTE — Patient Outreach (Signed)
Care Coordination ? ?04/05/2022 ? ?Arlyn Dunning ?March 25, 1966 ?[id number] ? ?04/05/2022 ?Name: SAMIKSHA PELLICANO MRN: [id number] DOB: 1966-12-08 ? ?Referred by: Irene Pap, PA-C ?Reason for referral : High Risk Managed Medicaid (Unsuccessful Telephone Outreach for Initial Visit with High Risk Managed Medicaid.) ? ? ?An unsuccessful telephone outreach was attempted today. The patient was referred to the case management team for assistance with care management and care coordination.  This RN Care Manager attempted numerous unsuccessful telephone outreaches today.  Patient's phone voicemail box was full and would not allow any messages to be left. ? ?Follow Up Plan: The Managed Medicaid care management team will reach out to the patient again over the next 7 - 14 days.  ? ? ?Salvatore Marvel RN, BSN ?Community Care Coordinator ?[place] [facility] ?Mobile: [phone]   ?

## 2022-04-05 NOTE — Patient Instructions (Signed)
Amanda Rich ,  ? ?The [facility] Managed Care Team is available to provide assistance to you with your healthcare needs at no cost and as a benefit of your [facility] Health plan.  ? ?I'm sorry I was unable to reach you today for our scheduled appointment. Our care guide will call you to reschedule our telephone appointment. Please call me at the number below. I am available to be of assistance to you regarding your healthcare needs. .  ? ?Thank you,  ? ?Salvatore Marvel RN, BSN ?Community Care Coordinator ?[place] [facility] ?Mobile: [phone]  ?

## 2022-04-05 NOTE — Therapy (Signed)
?OUTPATIENT PHYSICAL THERAPY TREATMENT NOTE ? ? ?Patient Name: Amanda Rich ?MRN: [id number] ?DOB:[birth year], 56 y.o., female ?Today's Date: 04/05/2022 ? ?PCP: Irene Pap, PA-C ?REFERRING PROVIDER: Bary Leriche, PA-C  ? ?END OF SESSION:  ? PT End of Session - 04/05/22 0806   ? ? Visit Number 3   ? Number of Visits 9   Plus eval  ? Date for PT Re-Evaluation 06/20/22   ? Authorization Type [place] Medicaid Wellcare   ? Authorization Time Period 03/30/22-06/29/22   ? Authorization - Number of Visits 8   ? Progress Note Due on Visit 10   ? PT Start Time [phone]   ? PT Stop Time 0844   ? PT Time Calculation (min) 41 min   ? Equipment Utilized During Treatment Gait belt   ? Activity Tolerance Patient tolerated treatment well   ? Behavior During Therapy [facility] for tasks assessed/performed   ? ?  ?  ? ?  ? ? ? ?Past Medical History:  ?Diagnosis Date  ? Anemia   ? Asthma   ? Cancer [facility])   ? breast cancer  ? Depression   ? Dyspnea   ? Family history of breast cancer   ? Family history of liver cancer   ? History of radiation therapy 03/10/21-04/11/21  ? Right breast- Dr. Gery Pray  ? Pericarditis   ? Smoker   ? Umbilical hernia   ? ?Past Surgical History:  ?Procedure Laterality Date  ? ABLATION ON ENDOMETRIOSIS    ? BREAST BIOPSY Right   ? BREAST LUMPECTOMY WITH RADIOACTIVE SEED AND SENTINEL LYMPH NODE BIOPSY Right 02/02/2021  ? Procedure: RIGHT BREAST LUMPECTOMY WITH RADIOACTIVE SEED AND RIGHT SENTINEL LYMPH NODE MAPPING;  Surgeon: [name], MD;  Location: [place];  Service: General;  Laterality: Right;  ? GANGLION CYST EXCISION  12/25/2005  ? L wrist; [facility], [place]  ? IR ANGIO INTRA EXTRACRAN SEL INTERNAL CAROTID BILAT MOD SED  02/06/2022  ? IR ANGIO INTRA EXTRACRAN SEL INTERNAL CAROTID UNI R MOD SED  03/10/2022  ? IR ANGIO VERTEBRAL SEL VERTEBRAL BILAT MOD SED  02/06/2022  ? IR ANGIOGRAM FOLLOW UP STUDY  03/14/2022  ? IR IMAGING GUIDED PORT INSERTION  09/10/2020  ? IR NEURO EACH ADD'L AFTER BASIC UNI  RIGHT (MS)  03/14/2022  ? IR TRANSCATH/EMBOLIZ  03/10/2022  ? RADIOLOGY WITH ANESTHESIA N/A 03/10/2022  ? Procedure: Pipeline Embolization of Right Internal Carotid Artery Aneurysm;  Surgeon: Consuella Lose, MD;  Location: [place];  Service: Radiology;  Laterality: N/A;  ? TUBAL LIGATION    ? [facility]  ? UMBILICAL HERNIA REPAIR  12/26/2003  ? [place]  ? ?Patient Active Problem List  ? Diagnosis Date Noted  ? 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 Subcortical infarction ([place]) 03/14/2022  ? S/P aneurysm repair 03/10/2022  ? Cerebral aneurysm, nonruptured 03/10/2022  ? Pain in right shoulder 02/02/2022  ? Carotid aneurysm, right ([place]) 12/22/2021  ? Muscle spasm of right leg 12/08/2021  ? Chemotherapy-induced neuropathy ([place]) 11/14/2021  ? Family history of breast cancer   ? Family history of liver cancer   ? Malignant neoplasm of 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, estrogen receptor positive ([place]) 08/25/2020  ? Abnormal chest CT 01/30/2019  ? History of pericarditis 01/30/2019  ? Pericarditis 01/22/2019  ? Chest pain 12/26/2018  ? History of endometrial ablation 01/21/2018  ? Family history of colon cancer 09/24/2017  ? Poor diet 01/24/2017  ?  Weight loss, abnormal 01/24/2017  ? Smoker 09/22/2016  ? Mixed hyperlipidemia 08/07/2016  ? Irregular intermenstrual bleeding 08/07/2016  ? Depression 08/07/2016  ? Fat necrosis of peritoneum ([place]) 08/07/2016  ? Pelvic congestion syndrome 08/07/2016  ? ? 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 ?THERAPY DIAG:  ?Muscle weakness (generalized) ? ?Other abnormalities of gait and mobility ? ?Unsteadiness on feet ? ?PERTINENT HISTORY: history of right breast cancer 1/2 years ago undergoing neoadjuvant chemotherapy which included Taxol.  Patient subsequently underwent lumpectomy right side axillary lymph node dissection.  She had  negative margins of the lumpectomy and nodes were negative for carcinoma.  She did undergo subsequent radiation treatments maintained on Arimidex therapy followed by Dr. Gery Pray as well as Dr. Mickeal Skinner.  Unfortunately likely as result of Taxol she complained of peripheral neuropathy including muscle spasms which involve primarily the right leg. Left foot drop.  ? ?PRECAUTIONS: fall ? ?SUBJECTIVE: Reports her beach trip went well, had no problems walking on the sand. No new changes ? ?PAIN:  ?Are you having pain? No ? ?TODAY'S TREATMENT:  ?Ther Ex ?SciFit elliptical level 1 for 6 minutes for dynamic cardiovascular warmup, improved BLE strength and LE coordination.  ? ?Eccentric fwd alt. step downs from 6" step w/BUE support on rails, x15 per side for improved quad and hip strength and single leg stance. Provided visual cues for proper sequencing and pt able to demonstrate w/SBA. Added to HEP (see below).  ? ?Eccentric lateral step downs from 6" step w/BUE support on rail, x10 each side for improved lateral hip and single leg stability. SBA throughout   ? ?Updated HEP (see bolded below) for improved single leg stability, ankle DF strength and glute/hip stability. Pt able to demonstrate safely.  ?  ?  ?PATIENT EDUCATION: ?Education details: Updates to HEP ?Person educated: Patient ?Education method: Explanation ?Education comprehension: verbalized understanding and needs further education ?  ?  ?HOME EXERCISE PROGRAM: ?Access Code: [email or web address] ?URL: [email or web address] ?Date: 04/05/2022 ?Prepared by: Mickie Bail Kijuan Gallicchio ? ?Exercises ?- Staggered Sit-to-Stand  - 1 x daily - 7 x weekly - 2 sets - 8 reps ?- Forward Step Down with Heel Tap and Counter Support  - 1 x daily - 7 x weekly - 3 sets - 10 reps ?- Side Step Down with Counter Support  - 1 x daily - 7 x weekly - 3 sets - 10 reps ?- Ankle Dorsiflexion with Resistance  - 1 x daily - 7 x weekly - 3 sets - 10 reps ?  ?  ?  ?GOALS: ?Goals reviewed with  patient? Yes ?  ?SHORT TERM GOALS: Target date: 04/25/2022 ?  ?Pt will be independent with initial HEP for improved strength, balance, transfers and gait. ?  ?Baseline: ?Goal status: INITIAL ?  ?2.  Pt will improve 5 x STS to less than or equal to 21 seconds without BUE support to demonstrate improved functional strength and transfer efficiency.   ?Baseline: 28.4s without BUE support ?Goal status: INITIAL ?  ?3.  Pt will improve Berg score to 48/56 for decreased fall risk ? ?Baseline: 42/56 ?Goal status: INITIAL ?  ?4.  Pt will improve gait velocity to at least 1.4 ft/s with LRAD with or without L AFO for improved gait efficiency and performance at limited community ambulator level  ?  ?Baseline: 0.72f/s with SBQD and no AFO ?Goal status: INITIAL ?  ?  ?LONG TERM GOALS: Target  date: 05/23/2022 ?  ?Pt will verbalize understanding of local community resources, including fitness post DC including aquatics and on land.  ?  ?Baseline:  ?Goal status: INITIAL ?  ?2.  Pt will improve Berg score to 53/56 for decreased fall risk ? ?Baseline: 42/56  ?Goal status: INITIAL ?  ?3.  Pt will improve gait velocity to at least 2.0 ft/s with LRAD with or without L AFO for improved gait efficiency and independence   ?  ?Baseline: 0.9f/s w/SBQD and no AFO ?Goal status: INITIAL ?  ?4.  Pt will improve FOTO to 74% or greater for improved confidence with mobility and return to PLOF  ?Baseline: 62% ?Goal status: INITIAL ?  ?5.  Pt will improve normal TUG to less than or equal to 13 seconds w/LRAD and with or without L AFO for improved functional mobility and decreased fall risk. ?  ?Baseline: 21.22s w/SBQD and no AFO ?Goal status: INITIAL ?  ?ASSESSMENT: ?  ?CLINICAL IMPRESSION: ?Emphasis of skilled PT session on updating HEP and BLE strength. Pt is continues to demonstrate L hip weakness but performed eccentric step downs well anteriorly and laterally. Pt hopes to practice walking fwd/retro on treadmill next session and feels as though she  is close to reaching her goals. Continue POC.  ?  ?  ? OBJECTIVE IMPAIRMENTS Abnormal gait, decreased activity tolerance, decreased balance, decreased coordination, decreased mobility, difficulty walking, decre

## 2022-04-06 ENCOUNTER — Telehealth: Payer: Self-pay | Admitting: Physician Assistant

## 2022-04-06 ENCOUNTER — Ambulatory Visit: Payer: Medicaid Other | Admitting: Occupational Therapy

## 2022-04-06 NOTE — Telephone Encounter (Signed)
.. ?  Medicaid Managed Care  ? ?Unsuccessful Outreach Note ? ?04/06/2022 ?Name: Amanda Rich MRN: [id number] DOB: 23-Feb-1966 ? ?Referred by: Irene Pap, PA-C ?Reason for referral : High Risk Managed Medicaid (I called the patient today to get her initial visit with the [facility] rescheduled. She did not answer and her VM was full.) ? ? ?An unsuccessful telephone outreach was attempted today. The patient was referred to the case management team for assistance with care management and care coordination.  ? ?Follow Up Plan: The care management team will reach out to the patient again over the next 7 days.  ? ? ?Reita Chard ?Care Guide, High Risk Medicaid Managed Care ?Embedded Care Coordination ?[place]  ? ? ?SIGNATURE  ?

## 2022-04-10 ENCOUNTER — Encounter: Payer: Self-pay | Admitting: Physical Medicine and Rehabilitation

## 2022-04-10 ENCOUNTER — Other Ambulatory Visit: Payer: Self-pay | Admitting: Physical Medicine and Rehabilitation

## 2022-04-10 MED ORDER — CHOLECALCIFEROL 25 MCG (1000 UT) PO TABS
2000.0000 [IU] | ORAL_TABLET | Freq: Every day | ORAL | 0 refills | Status: AC
  Filled 2022-04-10 – 2022-06-06 (×3): qty 90, 45d supply, fill #0

## 2022-04-10 MED ORDER — ASPIRIN 81 MG PO CHEW
81.0000 mg | CHEWABLE_TABLET | Freq: Every day | ORAL | 11 refills | Status: AC
Start: 2022-04-10 — End: ?
  Filled 2022-04-10: qty 30, 30d supply, fill #0
  Filled 2022-05-01 – 2022-05-04 (×2): qty 30, 30d supply, fill #1
  Filled 2022-06-06: qty 30, 30d supply, fill #2
  Filled 2022-07-11: qty 30, 30d supply, fill #3
  Filled 2022-08-07: qty 30, 30d supply, fill #4
  Filled 2022-08-31: qty 30, 30d supply, fill #5
  Filled 2022-10-16 – 2023-02-21 (×6): qty 30, 30d supply, fill #6

## 2022-04-10 MED ORDER — MAGNESIUM OXIDE -MG SUPPLEMENT 400 (240 MG) MG PO TABS
1.0000 | ORAL_TABLET | Freq: Every evening | ORAL | 11 refills | Status: AC
  Filled 2022-04-10 – 2022-04-13 (×3): qty 30, fill #0
  Filled 2022-05-01 – 2023-01-22 (×4): qty 30, 30d supply, fill #0

## 2022-04-11 ENCOUNTER — Ambulatory Visit: Payer: Medicaid Other | Admitting: Physical Therapy

## 2022-04-11 ENCOUNTER — Ambulatory Visit: Payer: Medicaid Other | Admitting: Occupational Therapy

## 2022-04-11 ENCOUNTER — Encounter: Payer: Self-pay | Admitting: Occupational Therapy

## 2022-04-11 ENCOUNTER — Encounter: Payer: Self-pay | Admitting: Physical Therapy

## 2022-04-11 ENCOUNTER — Other Ambulatory Visit (HOSPITAL_COMMUNITY): Payer: Self-pay

## 2022-04-11 DIAGNOSIS — I671 Cerebral aneurysm, nonruptured: Secondary | ICD-10-CM | POA: Diagnosis not present

## 2022-04-11 DIAGNOSIS — M6281 Muscle weakness (generalized): Secondary | ICD-10-CM

## 2022-04-11 DIAGNOSIS — R2689 Other abnormalities of gait and mobility: Secondary | ICD-10-CM | POA: Diagnosis not present

## 2022-04-11 DIAGNOSIS — R2681 Unsteadiness on feet: Secondary | ICD-10-CM

## 2022-04-11 DIAGNOSIS — R278 Other lack of coordination: Secondary | ICD-10-CM | POA: Diagnosis not present

## 2022-04-11 DIAGNOSIS — M25611 Stiffness of right shoulder, not elsewhere classified: Secondary | ICD-10-CM | POA: Diagnosis not present

## 2022-04-11 DIAGNOSIS — G8929 Other chronic pain: Secondary | ICD-10-CM | POA: Diagnosis not present

## 2022-04-11 DIAGNOSIS — M25511 Pain in right shoulder: Secondary | ICD-10-CM | POA: Diagnosis not present

## 2022-04-11 NOTE — Patient Instructions (Signed)
?  Coordination Activities ? ?Perform the following activities for 20 minutes 1 times per day with left hand(s). ? ?Rotate ball in fingertips (clockwise and counter-clockwise). ?Toss ball between hands. ?Flip cards 1 at a time  ?Deal cards with your thumb (Hold deck in hand and push card off top with thumb). ?Pick up coins and stack. ?

## 2022-04-11 NOTE — Therapy (Signed)
?OUTPATIENT PHYSICAL THERAPY TREATMENT NOTE ? ? ?Patient Name: Amanda Rich ?MRN: [id number] ?DOB:December 15, 1966, 56 y.o., female ?Today's Date: 04/11/2022 ? ?PCP: Irene Pap, PA-C ?REFERRING PROVIDER: Bary Leriche, PA-C  ? ?END OF SESSION:  ? PT End of Session - 04/11/22 0849   ? ? Visit Number 4   ? Number of Visits 9   Plus eval  ? Date for PT Re-Evaluation 06/20/22   ? Authorization Type [place] Medicaid [employer]   ? Authorization Time Period 03/30/22-06/29/22   ? Authorization - Visit Number 2   ? Authorization - Number of Visits 8   ? Progress Note Due on Visit 10   ? PT Start Time [phone]   received from OT  ? PT Stop Time 0930   ? PT Time Calculation (min) 43 min   ? Equipment Utilized During Treatment Gait belt   ? Activity Tolerance Patient tolerated treatment well   ? Behavior During Therapy [facility] for tasks assessed/performed   ? ?  ?  ? ?  ? ? ? ?Past Medical History:  ?Diagnosis Date  ? Anemia   ? Asthma   ? Cancer [facility])   ? breast cancer  ? Depression   ? Dyspnea   ? Family history of breast cancer   ? Family history of liver cancer   ? History of radiation therapy 03/10/21-04/11/21  ? Right breast- Dr. Gery Pray  ? Pericarditis   ? Smoker   ? Umbilical hernia   ? ?Past Surgical History:  ?Procedure Laterality Date  ? ABLATION ON ENDOMETRIOSIS    ? BREAST BIOPSY Right   ? BREAST LUMPECTOMY WITH RADIOACTIVE SEED AND SENTINEL LYMPH NODE BIOPSY Right 02/02/2021  ? Procedure: RIGHT BREAST LUMPECTOMY WITH RADIOACTIVE SEED AND RIGHT SENTINEL LYMPH NODE MAPPING;  Surgeon: [name], MD;  Location: [place];  Service: General;  Laterality: Right;  ? GANGLION CYST EXCISION  12/25/2005  ? L wrist; [facility], [place]  ? IR ANGIO INTRA EXTRACRAN SEL INTERNAL CAROTID BILAT MOD SED  02/06/2022  ? IR ANGIO INTRA EXTRACRAN SEL INTERNAL CAROTID UNI R MOD SED  03/10/2022  ? IR ANGIO VERTEBRAL SEL VERTEBRAL BILAT MOD SED  02/06/2022  ? IR ANGIOGRAM FOLLOW UP STUDY  03/14/2022  ? IR IMAGING GUIDED PORT  INSERTION  09/10/2020  ? IR NEURO EACH ADD'L AFTER BASIC UNI RIGHT (MS)  03/14/2022  ? IR TRANSCATH/EMBOLIZ  03/10/2022  ? RADIOLOGY WITH ANESTHESIA N/A 03/10/2022  ? Procedure: Pipeline Embolization of Right Internal Carotid Artery Aneurysm;  Surgeon: Consuella Lose, MD;  Location: [place];  Service: Radiology;  Laterality: N/A;  ? TUBAL LIGATION    ? [facility]  ? UMBILICAL HERNIA REPAIR  12/26/2003  ? [place]  ? ?Patient Active Problem List  ? Diagnosis Date Noted  ? 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 Subcortical infarction ([place]) 03/14/2022  ? S/P aneurysm repair 03/10/2022  ? Cerebral aneurysm, nonruptured 03/10/2022  ? Pain in right shoulder 02/02/2022  ? Carotid aneurysm, right ([place]) 12/22/2021  ? Muscle spasm of right leg 12/08/2021  ? Chemotherapy-induced neuropathy ([place]) 11/14/2021  ? Family history of breast cancer   ? Family history of liver cancer   ? Malignant neoplasm of 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, estrogen receptor positive ([place]) 08/25/2020  ? Abnormal chest CT 01/30/2019  ? History of pericarditis 01/30/2019  ? Pericarditis 01/22/2019  ? Chest pain 12/26/2018  ? History of endometrial ablation 01/21/2018  ? Family  history of colon cancer 09/24/2017  ? Poor diet 01/24/2017  ? Weight loss, abnormal 01/24/2017  ? Smoker 09/22/2016  ? Mixed hyperlipidemia 08/07/2016  ? Irregular intermenstrual bleeding 08/07/2016  ? Depression 08/07/2016  ? Fat necrosis of peritoneum ([place]) 08/07/2016  ? Pelvic congestion syndrome 08/07/2016  ? ? 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 ?THERAPY DIAG:  ?Muscle weakness (generalized) ? ?Other abnormalities of gait and mobility ? ?Unsteadiness on feet ? ?PERTINENT HISTORY: history of right breast cancer 1/2 years ago undergoing neoadjuvant chemotherapy which included Taxol.  Patient subsequently underwent  lumpectomy right side axillary lymph node dissection.  She had negative margins of the lumpectomy and nodes were negative for carcinoma.  She did undergo subsequent radiation treatments maintained on Arimidex therapy followed by Dr. Gery Pray as well as Dr. Mickeal Skinner.  Unfortunately likely as result of Taxol she complained of peripheral neuropathy including muscle spasms which involve primarily the right leg. Left foot drop.  ? ?PRECAUTIONS: fall ? ?SUBJECTIVE: No falls at home, nothing new to report. ? ?PAIN:  ?Are you having pain? No ? ?TODAY'S TREATMENT:  ?Left posterior [place] AFO donned prior to treadmill training. ?Treadmill training performed:  pt holds onto bilateral rails forward walking x91mn at 0.657m>0.8mph>x2mins at 1.0 mph w/ 1% incline>x3m70m at 1.0mp60m/ 2% incline>1.0mph50m incline ?Pt performs 3mins21mde stepping on treadmill w/ 0% incline at 0.3mph L44mR ?Pt performs retro stepping w/ BUE support>no UE support at 0.2mph an62m% incline ?Gait training overground x115' w/[id number] cane> x115' w/o tripod cane using CGA cued for inc RLE swing phase to clear past left foot increasing right step length, cued to relax shoulders to promote arm swing.  Pt demos inc cognition ?AFO doffed in sitting.  Pt able to don shoes independently. ?Added to HEP.  See below. ?  ?PATIENT EDUCATION: ?Education details: Additions to HEP.  Safety with return to treadmill use at home, low speed (1.0mph), n79mncline, with one of children present with gait belt and wearing AFO. ?Person educated: Patient ?Education method: Explanation ?Education comprehension: verbalized understanding and needs further education ?  ?  ?HOME EXERCISE PROGRAM: ?Access Code: [email or web address] ?Date: 04/05/2022 ?Prepared by: Jannah PlMickie Bail? ?Exercises ?- Staggered Sit-to-Stand  - 1 x daily - 7 x weekly - 2 sets - 8 reps ?- Forward Step Down with Heel Tap and Counter Support  - 1 x daily - 7 x weekly - 3 sets - 10  reps ?- Side Step Down with Counter Support  - 1 x daily - 7 x weekly - 3 sets - 10 reps ?- Ankle Dorsiflexion with Resistance  - 1 x daily - 7 x weekly - 3 sets - 10 reps ? - Backward Walking with Counter Support  - 1 x daily - 7 x weekly - 3 sets - 10 reps ?- Side Stepping with Counter Support  - 1 x daily - 7 x weekly - 3 sets - 10 reps ?  ?  ?GOALS: ?Goals reviewed with patient? Yes ?  ?SHORT TERM GOALS: Target date: 04/25/2022 ?  ?Pt will be independent with initial HEP for improved strength, balance, transfers and gait. ?  ?Baseline: ?Goal status: INITIAL ?  ?2.  Pt will improve 5 x STS to less than or equal to 21 seconds without BUE support to demonstrate improved functional strength and transfer efficiency.   ?Baseline: 28.4s without BUE support ?  Goal status: INITIAL ?  ?3.  Pt will improve Berg score to 48/56 for decreased fall risk ? ?Baseline: 42/56 ?Goal status: INITIAL ?  ?4.  Pt will improve gait velocity to at least 1.4 ft/s with LRAD with or without L AFO for improved gait efficiency and performance at limited community ambulator level  ?  ?Baseline: 0.80f/s with SBQD and no AFO ?Goal status: INITIAL ?  ?  ?LONG TERM GOALS: Target date: 05/23/2022 ?  ?Pt will verbalize understanding of local community resources, including fitness post DC including aquatics and on land.  ?  ?Baseline:  ?Goal status: INITIAL ?  ?2.  Pt will improve Berg score to 53/56 for decreased fall risk ? ?Baseline: 42/56  ?Goal status: INITIAL ?  ?3.  Pt will improve gait velocity to at least 2.0 ft/s with LRAD with or without L AFO for improved gait efficiency and independence   ?  ?Baseline: 0.838fs w/SBQD and no AFO ?Goal status: INITIAL ?  ?4.  Pt will improve FOTO to 74% or greater for improved confidence with mobility and return to PLOF  ?Baseline: 62% ?Goal status: INITIAL ?  ?5.  Pt will improve normal TUG to less than or equal to 13 seconds w/LRAD and with or without L AFO for improved functional mobility and decreased  fall risk. ?  ?Baseline: 21.22s w/SBQD and no AFO ?Goal status: INITIAL ?  ?ASSESSMENT: ?  ?CLINICAL IMPRESSION: ?Skilled session focused on gait training and assessing patient's ability to return to safe treadmill use

## 2022-04-11 NOTE — Therapy (Signed)
?OUTPATIENT OCCUPATIONAL THERAPY TREATMENT NOTE ? ? ?Patient Name: Amanda Rich ?MRN: [id number] ?DOB:[birth year], 56 y.o., female ?Today's Date: 04/11/2022 ? ?PCP: Irene Pap, PA-C ?REFERRING PROVIDER: Dr. Ranell Patrick ? ?END OF SESSION:  ? OT End of Session - 04/11/22 0814   ? ? Visit Number 3   ? Number of Visits 9   ? Date for OT Re-Evaluation 05/31/22   ? Authorization Type [employer]   ? Authorization Time Period 9 weeks, eval plus 8 visits- Pt has used 6 visits previously with PT this year   ? OT Start Time [phone]   ? OT Stop Time 0844   ? OT Time Calculation (min) 38 min   ? Activity Tolerance Patient tolerated treatment well   ? Behavior During Therapy [facility] for tasks assessed/performed   ? ?  ?  ? ?  ? ? ?Past Medical History:  ?Diagnosis Date  ? Anemia   ? Asthma   ? Cancer [facility])   ? breast cancer  ? Depression   ? Dyspnea   ? Family history of breast cancer   ? Family history of liver cancer   ? History of radiation therapy 03/10/21-04/11/21  ? Right breast- Dr. Gery Pray  ? Pericarditis   ? Smoker   ? Umbilical hernia   ? ?Past Surgical History:  ?Procedure Laterality Date  ? ABLATION ON ENDOMETRIOSIS    ? BREAST BIOPSY Right   ? BREAST LUMPECTOMY WITH RADIOACTIVE SEED AND SENTINEL LYMPH NODE BIOPSY Right 02/02/2021  ? Procedure: RIGHT BREAST LUMPECTOMY WITH RADIOACTIVE SEED AND RIGHT SENTINEL LYMPH NODE MAPPING;  Surgeon: [name], MD;  Location: [place];  Service: General;  Laterality: Right;  ? GANGLION CYST EXCISION  12/25/2005  ? L wrist; [facility], [place]  ? IR ANGIO INTRA EXTRACRAN SEL INTERNAL CAROTID BILAT MOD SED  02/06/2022  ? IR ANGIO INTRA EXTRACRAN SEL INTERNAL CAROTID UNI R MOD SED  03/10/2022  ? IR ANGIO VERTEBRAL SEL VERTEBRAL BILAT MOD SED  02/06/2022  ? IR ANGIOGRAM FOLLOW UP STUDY  03/14/2022  ? IR IMAGING GUIDED PORT INSERTION  09/10/2020  ? IR NEURO EACH ADD'L AFTER BASIC UNI RIGHT (MS)  03/14/2022  ? IR TRANSCATH/EMBOLIZ  03/10/2022  ? RADIOLOGY WITH  ANESTHESIA N/A 03/10/2022  ? Procedure: Pipeline Embolization of Right Internal Carotid Artery Aneurysm;  Surgeon: Consuella Lose, MD;  Location: [place];  Service: Radiology;  Laterality: N/A;  ? TUBAL LIGATION    ? [facility]  ? UMBILICAL HERNIA REPAIR  12/26/2003  ? [place]  ? ?Patient Active Problem List  ? Diagnosis Date Noted  ? 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 Subcortical infarction ([place]) 03/14/2022  ? S/P aneurysm repair 03/10/2022  ? Cerebral aneurysm, nonruptured 03/10/2022  ? Pain in right shoulder 02/02/2022  ? Carotid aneurysm, right ([place]) 12/22/2021  ? Muscle spasm of right leg 12/08/2021  ? Chemotherapy-induced neuropathy ([place]) 11/14/2021  ? Family history of breast cancer   ? Family history of liver cancer   ? Malignant neoplasm of 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, estrogen receptor positive ([place]) 08/25/2020  ? Abnormal chest CT 01/30/2019  ? History of pericarditis 01/30/2019  ? Pericarditis 01/22/2019  ? Chest pain 12/26/2018  ? History of endometrial ablation 01/21/2018  ? Family history of colon cancer 09/24/2017  ? Poor diet 01/24/2017  ? Weight loss, abnormal 01/24/2017  ? Smoker 09/22/2016  ? Mixed hyperlipidemia 08/07/2016  ? Irregular intermenstrual bleeding 08/07/2016  ?  Depression 08/07/2016  ? Fat necrosis of peritoneum ([place]) 08/07/2016  ? Pelvic congestion syndrome 08/07/2016  ? ? ?ONSET DATE: 03/10/22 ? ?REFERRING DIAG: I63.9 (ICD-10-CM) - Cerebral infarction, unspecified, ?56 yo F was hospitalized 03/10/22 for s/p Pipeline embolization of RICA aneurysm.  Pt was d/c from rehab 03/24/22. PMH includes asthma, anemia, depression, breast cancer s/p chemo, pericarditis,  S/P axillary lymph node dissection for breast carcinoma 10 months ago; currently in remission from Rt breast cancer, hx of subacromial injection . Pt was receiving PT for R shoulder prior to her surgery. ?  ?DIAGNOSTIC FINDINGS: RUE ?MRI IMPRESSION: ?1. Severe tendinosis of the  supraspinatus tendon with a high-grade ?partial-thickness articular surface tear and subcortical reactive ?marrow changes. ?2. Moderate tendinosis of the infraspinatus tendon. ?3. Mild tendinosis of the subscapularis tendon. ?4. Moderate tendinosis of the intra-articular portion of the long ?head of the biceps tendon. ?5. Mild subacromial/subdeltoid bursitis. ?  ?  ? ?THERAPY DIAG:  ?Muscle weakness (generalized) ? ?Other abnormalities of gait and mobility ? ?Unsteadiness on feet ? ?Chronic right shoulder pain ? ?Stiffness of right shoulder, not elsewhere classified ? ?Cerebral aneurysm ? ? ?PERTINENT HISTORY: PMH includes asthma, anemia, depression, breast cancer s/p chemo, pericarditis,  S/P axillary lymph node dissection for breast carcinoma 10 months ago; currently in remission from Rt breast cancer, hx of subacromial injection . Pt was receiving PT for R shoulder prior to her surgery. ? ?PRECAUTIONS: hx of breast CA, fall risk , see MRI for R shoulder above, pt was receiving PT for shoulder prior to CVA ? ?SUBJECTIVE: Pt denies pain ? ?PAIN:  ?Are you having pain? No ? ? ? ? ? ?TODAY'S TREATMENT: Coordination HEP issued ?Simple cooking task to scramble an egg, pt performed safely holding onto countertop, pt turned off stove without cueing. Therapist recommends that pt allows pan to cool on stove before taking it to sink. ?Grooved pegs for LUE coordination, min difficulty v.c for increased fine motor coordination ?Arm bike x 5 mins level 1 for conditioning. ?PATIENT EDUCATION: ?Education details:  coordination HEP  ?Person educated: Patient ?Education method: Explanation, demonstration ?Education comprehension: verbalized understanding, returned demonstration, min v.c  ? ? ? ?GOALS: ? ? ?SHORT TERM GOALS: Target date: 04/26/2022 ? ?I with initial HEP ?Baseline:dependent ?Goal status: INITIAL ? ?2.  I with energy conservation techniques and activity modification prn. ?Baseline: dependent ?Goal status: INITIAL ? ?3.   Pt will demonstrate improved LUE functional use as evidenced by increasing  LUE box/ blocks to 41 blocks or greater ?Baseline: Box and Blocks:  Right 75 blocks, Left 37 blocks ?Goal status: INITIAL ? ?4.  I with all basic ADLs ?Baseline: supervision ?Goal status: achieved ? ?5.  Pt will perform stove top cooking with supervision, min v.c demonstrating good safety awareness. ?Baseline: currently dependent for stovetop cooking, using air fryer ?Goal status: INITIAL ? ? ?LONG TERM GOALS: Target date: 05/31/22 ? ?I with updated HEP ?Baseline: dependent ?Goal status: INITIAL ? ?2.  Pt will demonstrate improved fine motor coordination for ADLS as evidenced by decreasing 9 hole peg test by 3 secs. ?Baseline: 9 Hole Peg test: Right: 21.72 sec; Left: 42.94 sec ?Goal status: INITIAL ? ?3.  Pt will increase left grip strength by 5 lbs for increased functional use during ADLs. ?Baseline: R 70.7 lbs, L 45.5 lbs ?Goal status: INITIAL ? ?4.  Pt will perform stovetop cooking modified independently demonstrating good safety awareness. ? ?Goal status: ongoing  ? ?5.  Pt will perform moderate level home management  modified independently demonstrating ?good safety awareness. ?Baseline: performs only very light home management activities. ?Goal status: INITIAL ? ? ? ?ASSESSMENT: ? ?CLINICAL IMPRESSION: ?Pt is progressing towards goals with coordination HEP ? ?PERFORMANCE DEFICITS in functional skills including ADLs, IADLs, coordination, dexterity, sensation, ROM, strength, pain, flexibility, FMC, GMC, balance, endurance, decreased knowledge of precautions, decreased knowledge of use of DME, and UE functional use, which impedes performance of ADLS/ IADLS. ? ?IMPAIRMENTS are limiting patient from ADLs, IADLs, work, leisure, and social participation.  ? ?COMORBIDITIES may have co-morbidities  that affects occupational performance. Patient will benefit from skilled OT to address above impairments and improve overall  function. ? ?MODIFICATION OR ASSISTANCE TO COMPLETE EVALUATION: No modification of tasks or assist necessary to complete an evaluation. ? ?OT OCCUPATIONAL PROFILE AND HISTORY: Problem focused assessment: Including review of reco

## 2022-04-13 ENCOUNTER — Other Ambulatory Visit (HOSPITAL_COMMUNITY): Payer: Self-pay

## 2022-04-13 ENCOUNTER — Ambulatory Visit: Payer: Medicaid Other | Admitting: Occupational Therapy

## 2022-04-14 ENCOUNTER — Other Ambulatory Visit (HOSPITAL_COMMUNITY): Payer: Self-pay

## 2022-04-18 ENCOUNTER — Ambulatory Visit: Payer: Medicaid Other | Admitting: Physical Therapy

## 2022-04-18 ENCOUNTER — Ambulatory Visit: Payer: Medicaid Other | Admitting: Occupational Therapy

## 2022-04-18 DIAGNOSIS — M25611 Stiffness of right shoulder, not elsewhere classified: Secondary | ICD-10-CM

## 2022-04-18 DIAGNOSIS — R2689 Other abnormalities of gait and mobility: Secondary | ICD-10-CM

## 2022-04-18 DIAGNOSIS — M6281 Muscle weakness (generalized): Secondary | ICD-10-CM | POA: Diagnosis not present

## 2022-04-18 DIAGNOSIS — M25511 Pain in right shoulder: Secondary | ICD-10-CM | POA: Diagnosis not present

## 2022-04-18 DIAGNOSIS — R2681 Unsteadiness on feet: Secondary | ICD-10-CM

## 2022-04-18 DIAGNOSIS — R278 Other lack of coordination: Secondary | ICD-10-CM | POA: Diagnosis not present

## 2022-04-18 DIAGNOSIS — G8929 Other chronic pain: Secondary | ICD-10-CM | POA: Diagnosis not present

## 2022-04-18 DIAGNOSIS — I671 Cerebral aneurysm, nonruptured: Secondary | ICD-10-CM | POA: Diagnosis not present

## 2022-04-18 NOTE — Patient Instructions (Signed)
SHOULDER: Flexion - Supine (Cane) ? ? ? ?Hold ball, shoebox, or paper towel roll in both hands (palms facing each other). Raise arms up overhead. Do not allow back to arch.  _10__ reps per set, _2__ sets per day ?

## 2022-04-18 NOTE — Therapy (Signed)
?OUTPATIENT PHYSICAL THERAPY TREATMENT NOTE ? ? ?Patient Name: Amanda Rich ?MRN: [id number] ?DOB:1966-07-28, 56 y.o., female ?Today's Date: 04/18/2022 ? ?PCP: Irene Pap, PA-C ?REFERRING PROVIDER: Bary Leriche, PA-C  ? ?END OF SESSION:  ? PT End of Session - 04/18/22 1962   ? ? Visit Number 5   ? Number of Visits 9   Plus eval  ? Date for PT Re-Evaluation 06/20/22   ? Authorization Type [place] Medicaid [employer]   ? Authorization Time Period 03/30/22-06/29/22   ? Authorization - Visit Number 2   ? Authorization - Number of Visits 8   ? Progress Note Due on Visit 10   ? PT Start Time [phone]   Pt arrived late  ? PT Stop Time 0844   ? PT Time Calculation (min) 38 min   ? Equipment Utilized During Treatment Other (comment)   L AFO  ? Activity Tolerance Patient tolerated treatment well   ? Behavior During Therapy [facility] for tasks assessed/performed   ? ?  ?  ? ?  ? ? ? ?Past Medical History:  ?Diagnosis Date  ? Anemia   ? Asthma   ? Cancer [facility])   ? breast cancer  ? Depression   ? Dyspnea   ? Family history of breast cancer   ? Family history of liver cancer   ? History of radiation therapy 03/10/21-04/11/21  ? Right breast- Dr. Gery Pray  ? Pericarditis   ? Smoker   ? Umbilical hernia   ? ?Past Surgical History:  ?Procedure Laterality Date  ? ABLATION ON ENDOMETRIOSIS    ? BREAST BIOPSY Right   ? BREAST LUMPECTOMY WITH RADIOACTIVE SEED AND SENTINEL LYMPH NODE BIOPSY Right 02/02/2021  ? Procedure: RIGHT BREAST LUMPECTOMY WITH RADIOACTIVE SEED AND RIGHT SENTINEL LYMPH NODE MAPPING;  Surgeon: [name], MD;  Location: [place];  Service: General;  Laterality: Right;  ? GANGLION CYST EXCISION  12/25/2005  ? L wrist; [facility], [place]  ? IR ANGIO INTRA EXTRACRAN SEL INTERNAL CAROTID BILAT MOD SED  02/06/2022  ? IR ANGIO INTRA EXTRACRAN SEL INTERNAL CAROTID UNI R MOD SED  03/10/2022  ? IR ANGIO VERTEBRAL SEL VERTEBRAL BILAT MOD SED  02/06/2022  ? IR ANGIOGRAM FOLLOW UP STUDY  03/14/2022  ? IR IMAGING GUIDED  PORT INSERTION  09/10/2020  ? IR NEURO EACH ADD'L AFTER BASIC UNI RIGHT (MS)  03/14/2022  ? IR TRANSCATH/EMBOLIZ  03/10/2022  ? RADIOLOGY WITH ANESTHESIA N/A 03/10/2022  ? Procedure: Pipeline Embolization of Right Internal Carotid Artery Aneurysm;  Surgeon: Consuella Lose, MD;  Location: [place];  Service: Radiology;  Laterality: N/A;  ? TUBAL LIGATION    ? [facility]  ? UMBILICAL HERNIA REPAIR  12/26/2003  ? [place]  ? ?Patient Active Problem List  ? Diagnosis Date Noted  ? 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 Subcortical infarction ([place]) 03/14/2022  ? S/P aneurysm repair 03/10/2022  ? Cerebral aneurysm, nonruptured 03/10/2022  ? Pain in right shoulder 02/02/2022  ? Carotid aneurysm, right ([place]) 12/22/2021  ? Muscle spasm of right leg 12/08/2021  ? Chemotherapy-induced neuropathy ([place]) 11/14/2021  ? Family history of breast cancer   ? Family history of liver cancer   ? Malignant neoplasm of 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, estrogen receptor positive ([place]) 08/25/2020  ? Abnormal chest CT 01/30/2019  ? History of pericarditis 01/30/2019  ? Pericarditis 01/22/2019  ? Chest pain 12/26/2018  ? History of endometrial ablation 01/21/2018  ?  Family history of colon cancer 09/24/2017  ? Poor diet 01/24/2017  ? Weight loss, abnormal 01/24/2017  ? Smoker 09/22/2016  ? Mixed hyperlipidemia 08/07/2016  ? Irregular intermenstrual bleeding 08/07/2016  ? Depression 08/07/2016  ? Fat necrosis of peritoneum ([place]) 08/07/2016  ? Pelvic congestion syndrome 08/07/2016  ? ? 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 ?THERAPY DIAG:  ?Unsteadiness on feet ? ?Other abnormalities of gait and mobility ? ?Muscle weakness (generalized) ? ?PERTINENT HISTORY: history of right breast cancer 1/2 years ago undergoing neoadjuvant chemotherapy which included Taxol.  Patient subsequently underwent  lumpectomy right side axillary lymph node dissection.  She had negative margins of the lumpectomy and nodes were negative for carcinoma.  She did undergo subsequent radiation treatments maintained on Arimidex therapy followed by Dr. Gery Pray as well as Dr. Mickeal Skinner.  Unfortunately likely as result of Taxol she complained of peripheral neuropathy including muscle spasms which involve primarily the right leg. Left foot drop.  ? ?PRECAUTIONS: fall ? ?SUBJECTIVE: Used the treadmill at home and went well, did not try retro walking due to fear. No new changes   ? ?PAIN:  ?Are you having pain? No ? ?TODAY'S TREATMENT:  ?Gait Training  ?In // bars for improved lateral weight shifting, single leg stance and gait training without AD:  ?-Single leg stance hold w/light UE support on rails, 2x30s per side. Min cues to avoid propping RLE on LLE when standing on L foot. Added to HEP (see bolded below)  ?-Fwd/retro walking without UE support x20 minutes for improved lateral weight . Pt initially demonstrated very robotic and slowed steps, narrow BOS, decreased step clearance (sliding feet) and absent sagittal plane rotation. Max verbal and visual cues to widen BOS, increase step clearance, increase speed of steps and incorporate contralateral arm swing for improved balance. S* throughout for safety. With more practice, pt's gait speed slightly increased.  ?-Side stepping without UE support x10 minutes for improved foot placement, wider BOS and step clearance. Max verbal cues initially for proper foot placement, cadence and BOS. Tapered off to knowledge of results cues. S* throughout.  ?  ?PATIENT EDUCATION: ?Education details: Additions to HEP. Practicing walking at home short distances without cane(in hallway or along a wall in case pt needs to grab something for balance)  ?Person educated: Patient ?Education method: Explanation and handout  ?Education comprehension: verbalized understanding and needs further education ?  ?   ?HOME EXERCISE PROGRAM: ?Access Code: [email or web address] ?URL: [email or web address] ?Date: 04/18/2022 ?Prepared by: Mickie Bail Shatika Grinnell ? ?Exercises ?- Staggered Sit-to-Stand  - 1 x daily - 7 x weekly - 2 sets - 8 reps ?- Forward Step Down with Heel Tap and Counter Support  - 1 x daily - 7 x weekly - 3 sets - 10 reps ?- Side Step Down with Counter Support  - 1 x daily - 7 x weekly - 3 sets - 10 reps ?- Ankle Dorsiflexion with Resistance  - 1 x daily - 7 x weekly - 3 sets - 10 reps - 2 second hold ?- Backward Walking with Counter Support  - 1 x daily - 7 x weekly - 3 sets - 10 reps ?- Side Stepping with Counter Support  - 1 x daily - 7 x weekly - 3 sets - 10 reps ?- Standing Single Leg Stance with Counter Support  - 1 x daily - 7 x weekly - 3 sets - 10 reps -  30 second hold ?  ?  ?GOALS: ?Goals reviewed with patient? Yes ?  ?SHORT TERM GOALS: Target date: 04/25/2022 ?  ?Pt will be independent with initial HEP for improved strength, balance, transfers and gait. ?  ?Baseline: ?Goal status: MET ?  ?2.  Pt will improve 5 x STS to less than or equal to 21 seconds without BUE support to demonstrate improved functional strength and transfer efficiency.   ?Baseline: 28.4s without BUE support ?Goal status: INITIAL ?  ?3.  Pt will improve Berg score to 48/56 for decreased fall risk ? ?Baseline: 42/56 ?Goal status: INITIAL ?  ?4.  Pt will improve gait velocity to at least 1.4 ft/s with LRAD with or without L AFO for improved gait efficiency and performance at limited community ambulator level  ?  ?Baseline: 0.22f/s with SBQD and no AFO ?Goal status: INITIAL ?  ?  ?LONG TERM GOALS: Target date: 05/23/2022 ?  ?Pt will verbalize understanding of local community resources, including fitness post DC including aquatics and on land.  ?  ?Baseline:  ?Goal status: INITIAL ?  ?2.  Pt will improve Berg score to 53/56 for decreased fall risk ? ?Baseline: 42/56  ?Goal status: INITIAL ?  ?3.  Pt will improve gait velocity to at least 2.0 ft/s  with LRAD with or without L AFO for improved gait efficiency and independence   ?  ?Baseline: 0.898fs w/SBQD and no AFO ?Goal status: INITIAL ?  ?4.  Pt will improve FOTO to 74% or greater for improved co

## 2022-04-18 NOTE — Therapy (Signed)
?OUTPATIENT OCCUPATIONAL THERAPY TREATMENT NOTE ? ? ?Patient Name: Amanda Rich ?MRN: [id number] ?DOB:1966/06/26, 56 y.o., female ?Today's Date: 04/18/2022 ? ?PCP: Irene Pap, PA-C ?REFERRING PROVIDER: Dr. Ranell Patrick ? ?END OF SESSION:  ? OT End of Session - 04/18/22 0850   ? ? Visit Number 4   ? Number of Visits 9   ? Date for OT Re-Evaluation 05/31/22   ? Authorization Type [employer] Medicaid   ? Authorization Time Period 9 weeks, eval plus 8 visits- Pt has used 6 visits previously with PT this year   ? OT Start Time 0845   ? OT Stop Time 0930   ? OT Time Calculation (min) 45 min   ? Activity Tolerance Patient tolerated treatment well   ? Behavior During Therapy [facility] for tasks assessed/performed   ? ?  ?  ? ?  ? ? ?Past Medical History:  ?Diagnosis Date  ? Anemia   ? Asthma   ? Cancer [facility])   ? breast cancer  ? Depression   ? Dyspnea   ? Family history of breast cancer   ? Family history of liver cancer   ? History of radiation therapy 03/10/21-04/11/21  ? Right breast- Dr. Gery Pray  ? Pericarditis   ? Smoker   ? Umbilical hernia   ? ?Past Surgical History:  ?Procedure Laterality Date  ? ABLATION ON ENDOMETRIOSIS    ? BREAST BIOPSY Right   ? BREAST LUMPECTOMY WITH RADIOACTIVE SEED AND SENTINEL LYMPH NODE BIOPSY Right 02/02/2021  ? Procedure: RIGHT BREAST LUMPECTOMY WITH RADIOACTIVE SEED AND RIGHT SENTINEL LYMPH NODE MAPPING;  Surgeon: [name], MD;  Location: [place];  Service: General;  Laterality: Right;  ? GANGLION CYST EXCISION  12/25/2005  ? L wrist; [facility], [place]  ? IR ANGIO INTRA EXTRACRAN SEL INTERNAL CAROTID BILAT MOD SED  02/06/2022  ? IR ANGIO INTRA EXTRACRAN SEL INTERNAL CAROTID UNI R MOD SED  03/10/2022  ? IR ANGIO VERTEBRAL SEL VERTEBRAL BILAT MOD SED  02/06/2022  ? IR ANGIOGRAM FOLLOW UP STUDY  03/14/2022  ? IR IMAGING GUIDED PORT INSERTION  09/10/2020  ? IR NEURO EACH ADD'L AFTER BASIC UNI RIGHT (MS)  03/14/2022  ? IR TRANSCATH/EMBOLIZ  03/10/2022  ? RADIOLOGY WITH  ANESTHESIA N/A 03/10/2022  ? Procedure: Pipeline Embolization of Right Internal Carotid Artery Aneurysm;  Surgeon: Consuella Lose, MD;  Location: [place];  Service: Radiology;  Laterality: N/A;  ? TUBAL LIGATION    ? [facility]  ? UMBILICAL HERNIA REPAIR  12/26/2003  ? [place]  ? ?Patient Active Problem List  ? Diagnosis Date Noted  ? 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 Subcortical infarction ([place]) 03/14/2022  ? S/P aneurysm repair 03/10/2022  ? Cerebral aneurysm, nonruptured 03/10/2022  ? Pain in right shoulder 02/02/2022  ? Carotid aneurysm, right ([place]) 12/22/2021  ? Muscle spasm of right leg 12/08/2021  ? Chemotherapy-induced neuropathy ([place]) 11/14/2021  ? Family history of breast cancer   ? Family history of liver cancer   ? Malignant neoplasm of 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, estrogen receptor positive ([place]) 08/25/2020  ? Abnormal chest CT 01/30/2019  ? History of pericarditis 01/30/2019  ? Pericarditis 01/22/2019  ? Chest pain 12/26/2018  ? History of endometrial ablation 01/21/2018  ? Family history of colon cancer 09/24/2017  ? Poor diet 01/24/2017  ? Weight loss, abnormal 01/24/2017  ? Smoker 09/22/2016  ? Mixed hyperlipidemia 08/07/2016  ? Irregular intermenstrual bleeding 08/07/2016  ?  Depression 08/07/2016  ? Fat necrosis of peritoneum ([place]) 08/07/2016  ? Pelvic congestion syndrome 08/07/2016  ? ? ?ONSET DATE: 03/10/22 ? ?REFERRING DIAG: I63.9 (ICD-10-CM) - Cerebral infarction, unspecified, ?56 yo F was hospitalized 03/10/22 for s/p Pipeline embolization of RICA aneurysm.  Pt was d/c from rehab 03/24/22. PMH includes asthma, anemia, depression, breast cancer s/p chemo, pericarditis,  S/P axillary lymph node dissection for breast carcinoma 10 months ago; currently in remission from Rt breast cancer, hx of subacromial injection . Pt was receiving PT for R shoulder prior to her surgery. ?  ?DIAGNOSTIC FINDINGS: RUE ?MRI IMPRESSION: ?1. Severe tendinosis of the  supraspinatus tendon with a high-grade ?partial-thickness articular surface tear and subcortical reactive ?marrow changes. ?2. Moderate tendinosis of the infraspinatus tendon. ?3. Mild tendinosis of the subscapularis tendon. ?4. Moderate tendinosis of the intra-articular portion of the long ?head of the biceps tendon. ?5. Mild subacromial/subdeltoid bursitis. ?  ?  ? ?THERAPY DIAG:  ?Muscle weakness (generalized) ? ?Chronic right shoulder pain ? ?Stiffness of right shoulder, not elsewhere classified ? ? ?PERTINENT HISTORY: PMH includes asthma, anemia, depression, breast cancer s/p chemo, pericarditis,  S/P axillary lymph node dissection for breast carcinoma 10 months ago; currently in remission from Rt breast cancer, hx of subacromial injection . Pt was receiving PT for R shoulder prior to her surgery. ? ?PRECAUTIONS: hx of breast CA, fall risk , see MRI for R shoulder above, pt was receiving PT for shoulder prior to CVA ? ?SUBJECTIVE: Pt denies pain ? ?PAIN:  ?Are you having pain? No ? ? ? ? ? ?TODAY'S TREATMENT:  ? ?Pt placing small pegs in pegboard LUE on vertical surface requiring b/t 70-90* sh flexion while copying peg design with 100% accuracy. Pt reports pain Lt shoulder 5/10 with this activity. Gripper set at level 2 resistance to pick up blocks for sustained grip strength Lt hand.  ? ?UBE x 5 min, level 1 for reciprocal movement pattern ? ?PATIENT EDUCATION: ?04/18/22: Education details: BUE sh flexion HEP supine (no pain either arm) ?Person educated: Patient ?Education method: Explanation, Demonstration, and Handouts ?Education comprehension: verbalized understanding and returned demonstration ? ? ? ?PATIENT EDUCATION: ?Education details:  coordination HEP (04/11/22) ?Person educated: Patient ?Education method: Explanation, demonstration ?Education comprehension: verbalized understanding, returned demonstration, min v.c  ? ? ? ?GOALS: ? ? ?SHORT TERM GOALS: Target date: 04/26/2022 ? ?I with initial  HEP ?Baseline:dependent ?Goal status: ONGOING ? ?2.  I with energy conservation techniques and activity modification prn. ?Baseline: dependent ?Goal status: INITIAL ? ?3.  Pt will demonstrate improved LUE functional use as evidenced by increasing  LUE box/ blocks to 41 blocks or greater ?Baseline: Box and Blocks:  Right 75 blocks, Left 37 blocks ?Goal status: INITIAL ? ?4.  I with all basic ADLs ?Baseline: supervision ?Goal status: achieved ? ?5.  Pt will perform stove top cooking with supervision, min v.c demonstrating good safety awareness. ?Baseline: currently dependent for stovetop cooking, using air fryer ?Goal status: ONGOING (demo in clinic on 04/11/22) ? ? ?LONG TERM GOALS: Target date: 05/31/22 ? ?I with updated HEP ?Baseline: dependent ?Goal status: INITIAL ? ?2.  Pt will demonstrate improved fine motor coordination for ADLS as evidenced by decreasing 9 hole peg test by 3 secs. ?Baseline: 9 Hole Peg test: Right: 21.72 sec; Left: 42.94 sec ?Goal status: INITIAL ? ?3.  Pt will increase left grip strength by 5 lbs for increased functional use during ADLs. ?Baseline: R 70.7 lbs, L 45.5 lbs ?Goal status: INITIAL ? ?4.  Pt will perform stovetop cooking modified independently demonstrating good safety awareness. ? ?Goal status: ongoing  ? ?5.  Pt will perform moderate level home management modified independently demonstrating ?good safety awareness. ?Baseline: performs only very light home management activities. ?Goal status: INITIAL ? ? ? ?ASSESSMENT: ? ?CLINICAL IMPRESSION: ?Pt is progressing towards STG's. Pt fatigues easily LUE ? ?PERFORMANCE DEFICITS in functional skills including ADLs, IADLs, coordination, dexterity, sensation, ROM, strength, pain, flexibility, FMC, GMC, balance, endurance, decreased knowledge of precautions, decreased knowledge of use of DME, and UE functional use, which impedes performance of ADLS/ IADLS. ? ?IMPAIRMENTS are limiting patient from ADLs, IADLs, work, leisure, and social  participation.  ? ?COMORBIDITIES may have co-morbidities  that affects occupational performance. Patient will benefit from skilled OT to address above impairments and improve overall function. ? ?MODIFICATION OR ASSISTANCE

## 2022-04-19 ENCOUNTER — Other Ambulatory Visit: Payer: Self-pay

## 2022-04-19 NOTE — Patient Instructions (Signed)
Amanda Rich ,  ? ?The [facility] Managed Care Team is available to provide assistance to you with your healthcare needs at no cost and as a benefit of your [facility] Health plan.  ? ?I'm sorry I was unable to reach you today for our scheduled appointment. This was our second telephone attempt to reach you.  Our care guide will call you to reschedule our telephone appointment. Please call me at the number below. I am available to be of assistance to you regarding your healthcare needs. .  ? ?Thank you,  ? ?Salvatore Marvel RN, BSN ?Community Care Coordinator ?[facility] ?Mobile: [phone]   ?

## 2022-04-19 NOTE — Patient Outreach (Signed)
Care Coordination ? ?04/19/2022 ? ?Arlyn Dunning ?1966-02-25 ?[id number] ? ?04/19/2022 ?Name: Amanda Rich MRN: [id number] DOB: July 08, 1966 ? ?Referred by: Irene Pap, PA-C ?Reason for referral : High Risk Managed Medicaid (Unsuccessful Telephone Outreach) ? ? ?A second unsuccessful telephone outreach was attempted today. The patient was referred to the case management team for assistance with care management and care coordination.   ?This RN Case Manager attempted 2 unsuccessful telephone outreaches today and was unable to leave a voicemail message because the patient's telephone mailbox was full.  ? ?Follow Up Plan: The Managed Medicaid care management team will reach out to the patient again over the next 7 -14 days.  ? ? ?Salvatore Marvel RN, BSN ?Community Care Coordinator ?[facility] ?Mobile: [phone]  ?

## 2022-04-20 ENCOUNTER — Telehealth: Payer: Self-pay | Admitting: Physician Assistant

## 2022-04-20 ENCOUNTER — Other Ambulatory Visit (HOSPITAL_COMMUNITY): Payer: Self-pay

## 2022-04-20 ENCOUNTER — Inpatient Hospital Stay: Payer: Medicaid Other | Attending: Hematology and Oncology | Admitting: Internal Medicine

## 2022-04-20 ENCOUNTER — Other Ambulatory Visit: Payer: Self-pay

## 2022-04-20 VITALS — BP 109/74 | HR 84 | Temp 97.5°F | Resp 20 | Wt 133.1 lb

## 2022-04-20 DIAGNOSIS — Z79899 Other long term (current) drug therapy: Secondary | ICD-10-CM | POA: Insufficient documentation

## 2022-04-20 DIAGNOSIS — R531 Weakness: Secondary | ICD-10-CM | POA: Insufficient documentation

## 2022-04-20 DIAGNOSIS — T451X5A Adverse effect of antineoplastic and immunosuppressive drugs, initial encounter: Secondary | ICD-10-CM

## 2022-04-20 DIAGNOSIS — Z170 Estrogen receptor positive status [ER+]: Secondary | ICD-10-CM | POA: Diagnosis not present

## 2022-04-20 DIAGNOSIS — Z923 Personal history of irradiation: Secondary | ICD-10-CM | POA: Insufficient documentation

## 2022-04-20 DIAGNOSIS — Z79811 Long term (current) use of aromatase inhibitors: Secondary | ICD-10-CM | POA: Diagnosis not present

## 2022-04-20 DIAGNOSIS — C50211 Malignant neoplasm of upper-inner quadrant of right female breast: Secondary | ICD-10-CM | POA: Insufficient documentation

## 2022-04-20 DIAGNOSIS — F17210 Nicotine dependence, cigarettes, uncomplicated: Secondary | ICD-10-CM | POA: Diagnosis not present

## 2022-04-20 DIAGNOSIS — G620 Drug-induced polyneuropathy: Secondary | ICD-10-CM

## 2022-04-20 NOTE — Progress Notes (Signed)
? ?[facility] [address]  ?[place], [place] [address] ?[phone] ? ? ?Interval Evaluation ? ?Date of Service: 04/20/22 ?Patient Name: Amanda Rich ?Patient MRN: [id number] ?Patient DOB: 09-21-1966 ?Provider: Ventura Sellers, MD ? ?Identifying Statement:  ?Amanda Rich is a 56 y.o. female with Chemotherapy-induced neuropathy ([place])  ? ?Primary Cancer: ? ?Oncologic History: ?Oncology History  ?Malignant neoplasm of 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, estrogen receptor positive ([place])  ?08/25/2020 Initial Diagnosis  ? Patient palpated a right breast lump x12yrand a left breast lump x276yrDiagnostic mammogram and USKoreahowed in the right breast, a 2.7cm mass 5cm from the nipple and 0.8cm mass 1cm from the nipple at the 12:30 position, with borderline cortical thickening in the right axilla, and in the left breast, a 4.0cm mass at the 11 o'clock position representing a hamartoma. 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 ?  ?09/13/2020 - 01/18/2021 Chemotherapy  ? Neoadjuvant chemotherapy with TCFultonerjeta ? ? ?  ? ?  ?02/02/2021 Surgery  ? Right lumpectomy ([facility]): no residual carcinoma, 4 right axillary lymph nodes negative for carcinoma. ?  ?02/21/2021 - 08/30/2021 Chemotherapy  ?  ? ? Patient is on Antibody Plan: BREAST TRASTUZUMAB + PERTUZUMAB Q21D  ? ?  ?03/11/2021 - 04/11/2021 Radiation Therapy  ? Adjuvant radiation ?  ?09/2021 -  Anti-estrogen oral therapy  ? Anastrozole daily ?  ? ? ?Interval History: ?Amanda MOONresents today for follow up.  She is improving with regards to left sided weakness following her aneurysm embolization.  Walking with a cane, and only with mild clumsiness of the left hand.  Prior had been using a walker, continues to work aggressively with rehab. Neuropathy is well controlled with the  gabapentin (30070mwice per day), she tried the lyrica 53m33mice per day, but did not tolerate it well. Baclofen continues to help with the spasms.   ? ?H+P (presents today to review neuropathic symptoms.  She describes new onset of pain, burning, tingling affecting the feet and lower aspects of both legs.  Onset was during chemotherapy for breast cancer this past year which involved taxol based regimen.  She also describes cramping symptoms affecting her hands and feet which have accompanied the pain in recent weeks.  Has been dosing gabapentin 300mg59m which has helped "a little". Currently on hormone therapy only, denies history of diabetes or alcohol abuse. ? ?Medications: ?Current Outpatient Medications on File Prior to Visit  ?Medication Sig Dispense Refill  ? acetaminophen (TYLENOL) 325 MG tablet Take 2 tablets (650 mg total) by mouth every 4 (four) hours as needed for headache or moderate pain.    ? albuterol (PROAIR HFA) 108 (90 Base) MCG/ACT inhaler Inhale 2 puffs into the lungs every 6 (six) hours as needed for wheezing or shortness of breath. 18 g 0  ? anastrozole (ARIMIDEX) 1 MG tablet Take 1 tablet (1 mg total) by mouth daily. 90 tablet 3  ? aspirin 81 MG chewable tablet Chew 1 tablet (81 mg total) by mouth daily. 30 tablet 11  ? baclofen (LIORESAL) 10 MG tablet Take 1 tablet (10 mg total) by mouth 3 (three) times daily as needed for muscle spasms. 60 each 0  ? budesonide-formoterol (SYMBICORT) 80-4.5 MCG/ACT inhaler Inhale 2 puffs into the lungs in the  morning and at bedtime. 10.2 g 12  ? Calcium-Vitamin D-Vitamin K (VIACTIV PO) Take 1 tablet by mouth daily.    ? Cholecalciferol 25 MCG (1000 UT) tablet Take 2 tablets (2,000 Units total) by mouth daily. 90 tablet 0  ? citalopram (CELEXA) 10 MG tablet Take 1 tablet (10 mg total) by mouth daily. 90 tablet 3  ? gabapentin (NEURONTIN) 300 MG capsule Take 1 capsule (300 mg total) by mouth 2 (two) times daily. 60 capsule 0  ? HYDROcodone-acetaminophen  (NORCO/VICODIN) 5-325 MG tablet Take 1 tablet by mouth every 4 (four) hours as needed for moderate pain. (Patient not taking: Reported on 03/28/2022) 30 tablet 0  ? loratadine (CLARITIN) 10 MG tablet Take 1 tablet (10 mg total) by mouth daily. 30 tablet 0  ? Magnesium 400 MG TABS Take 1 tablet by mouth at bedtime. 30 tablet 11  ? magnesium oxide (MAG-OX) 400 MG tablet Take 1/2 tablet (200 mg total) by mouth at bedtime. 15 tablet 0  ? pantoprazole (PROTONIX) 40 MG tablet Take 1 tablet (40 mg total) by mouth at bedtime. (Patient not taking: Reported on 03/28/2022) 30 tablet 0  ? ticagrelor (BRILINTA) 90 MG TABS tablet Take 1 tablet (90 mg total) by mouth 2 (two) times daily. 60 tablet 0  ? Tiotropium Bromide Monohydrate (SPIRIVA RESPIMAT) 2.5 MCG/ACT AERS Inhale 2 puffs into the lungs daily. 4 g 6  ? umeclidinium bromide (INCRUSE ELLIPTA) 62.5 MCG/ACT AEPB Inhale 1 puff into the lungs daily. (Patient not taking: Reported on 03/28/2022) 30 each 0  ? [DISCONTINUED] prochlorperazine (COMPAZINE) 10 MG tablet Take 1 tablet (10 mg total) by mouth every 6 (six) hours as needed (Nausea or vomiting). 30 tablet 1  ? ?No current facility-administered medications on file prior to visit.  ? ? ?Allergies:  ?Allergies  ?Allergen Reactions  ? Clarithromycin Swelling  ?  Biaxin  ? Lyrica [Pregabalin]   ?  Oversedated   ? Tape   ?  Rash from Tegaderm  ? Ivp Dye [Iodinated Contrast Media] Rash  ? ?Past Medical History:  ?Past Medical History:  ?Diagnosis Date  ? Anemia   ? Asthma   ? Cancer [facility])   ? breast cancer  ? Depression   ? Dyspnea   ? Family history of breast cancer   ? Family history of liver cancer   ? History of radiation therapy 03/10/21-04/11/21  ? Right breast- Dr. Gery Pray  ? Pericarditis   ? Smoker   ? Umbilical hernia   ? ?Past Surgical History:  ?Past Surgical History:  ?Procedure Laterality Date  ? ABLATION ON ENDOMETRIOSIS    ? BREAST BIOPSY Right   ? BREAST LUMPECTOMY WITH RADIOACTIVE SEED AND SENTINEL LYMPH NODE  BIOPSY Right 02/02/2021  ? Procedure: RIGHT BREAST LUMPECTOMY WITH RADIOACTIVE SEED AND RIGHT SENTINEL LYMPH NODE MAPPING;  Surgeon: [name], MD;  Location: [place];  Service: General;  Laterality: Right;  ? GANGLION CYST EXCISION  12/25/2005  ? L wrist; [facility], [place]  ? IR ANGIO INTRA EXTRACRAN SEL INTERNAL CAROTID BILAT MOD SED  02/06/2022  ? IR ANGIO INTRA EXTRACRAN SEL INTERNAL CAROTID UNI R MOD SED  03/10/2022  ? IR ANGIO VERTEBRAL SEL VERTEBRAL BILAT MOD SED  02/06/2022  ? IR ANGIOGRAM FOLLOW UP STUDY  03/14/2022  ? IR IMAGING GUIDED PORT INSERTION  09/10/2020  ? IR NEURO EACH ADD'L AFTER BASIC UNI RIGHT (MS)  03/14/2022  ? IR TRANSCATH/EMBOLIZ  03/10/2022  ? RADIOLOGY WITH ANESTHESIA N/A 03/10/2022  ?  Procedure: Pipeline Embolization of Right Internal Carotid Artery Aneurysm;  Surgeon: Consuella Lose, MD;  Location: [facility];  Service: Radiology;  Laterality: N/A;  ? TUBAL LIGATION    ? [facility]  ? UMBILICAL HERNIA REPAIR  12/26/2003  ? [place]  ? ?Social History:  ?Social History  ? ?Socioeconomic History  ? Marital status: Divorced  ?  Spouse name: Not on file  ? Number of children: 3  ? Years of education: Not on file  ? Highest education level: Associate degree: academic program  ?Occupational History  ? Not on file  ?Tobacco Use  ? Smoking status: Every Day  ?  Packs/day: 1.00  ?  Years: 25.00  ?  Pack years: 25.00  ?  Types: Cigarettes  ? Smokeless tobacco: Never  ? Tobacco comments:  ?  Using Nicotine Patch 03/06/22  ?Vaping Use  ? Vaping Use: Never used  ?Substance and Sexual Activity  ? Alcohol use: Not Currently  ?  Comment: rarely  ? Drug use: No  ? Sexual activity: Yes  ?  Birth control/protection: Post-menopausal  ?Other Topics Concern  ? Not on file  ?Social History Narrative  ? Not on file  ? ?Social Determinants of Health  ? ?Financial Resource Strain: Not on file  ?Food Insecurity: Not on file  ?Transportation Needs: Not on file  ?Physical Activity: Not on file  ?Stress: Not on  file  ?Social Connections: Not on file  ?Intimate Partner Violence: Not on file  ? ?Family History:  ?Family History  ?Problem Relation Age of Onset  ? Diabetes Mother   ? Hypertension Mother   ? Liver cancer D

## 2022-04-20 NOTE — Telephone Encounter (Signed)
.. ?  Medicaid Managed Care  ? ?Unsuccessful Outreach Note ? ?04/20/2022 ?Name: Amanda Rich MRN: [id number] DOB: 08/11/1966 ? ?Referred by: Irene Pap, PA-C ?Reason for referral : High Risk Managed Medicaid (I called the patient today to get her rescheduled with the MM RNCM. She did not answer and her VM was full.) ? ? ?Third unsuccessful telephone outreach was attempted today. The patient was referred to the case management team for assistance with care management and care coordination. The patient's primary care provider has been notified of our unsuccessful attempts to make or maintain contact with the patient. The care management team is pleased to engage with this patient at any time in the future should he/she be interested in assistance from the care management team.  ? ?Follow Up Plan: We have been unable to make contact with the patient for follow up. The care management team is available to follow up with the patient after provider conversation with the patient regarding recommendation for care management engagement and subsequent re-referral to the care management team.  ? ? ?Reita Chard ?Care Guide, High Risk Medicaid Managed Care ?Embedded Care Coordination ?[place]  ? ? ?SIGNATURE  ?

## 2022-04-24 DIAGNOSIS — Z419 Encounter for procedure for purposes other than remedying health state, unspecified: Secondary | ICD-10-CM | POA: Diagnosis not present

## 2022-04-25 ENCOUNTER — Ambulatory Visit: Payer: Medicaid Other | Attending: Physical Medicine and Rehabilitation | Admitting: Occupational Therapy

## 2022-04-25 ENCOUNTER — Ambulatory Visit: Payer: Medicaid Other | Admitting: Physical Therapy

## 2022-04-27 ENCOUNTER — Telehealth: Payer: Self-pay

## 2022-04-27 DIAGNOSIS — I671 Cerebral aneurysm, nonruptured: Secondary | ICD-10-CM | POA: Diagnosis not present

## 2022-04-27 NOTE — Telephone Encounter (Signed)
Pt called and LVM stating she saw her MD and had swollen (R) lymph node (did not specify location) and was advised to f/u w/ Dr Lindi Adie. Attempted to call pt; VM box full.  ?

## 2022-05-02 ENCOUNTER — Other Ambulatory Visit (HOSPITAL_COMMUNITY): Payer: Self-pay

## 2022-05-02 ENCOUNTER — Encounter (HOSPITAL_COMMUNITY): Payer: Self-pay

## 2022-05-02 ENCOUNTER — Ambulatory Visit: Payer: Medicaid Other | Admitting: Physical Therapy

## 2022-05-02 ENCOUNTER — Ambulatory Visit: Payer: Medicaid Other | Admitting: Occupational Therapy

## 2022-05-02 ENCOUNTER — Encounter: Payer: Self-pay | Admitting: Hematology and Oncology

## 2022-05-04 ENCOUNTER — Other Ambulatory Visit (HOSPITAL_COMMUNITY): Payer: Self-pay

## 2022-05-09 ENCOUNTER — Encounter: Payer: Medicaid Other | Admitting: Occupational Therapy

## 2022-05-09 ENCOUNTER — Ambulatory Visit: Payer: Medicaid Other | Admitting: Physical Therapy

## 2022-05-09 NOTE — Therapy (Incomplete)
?OUTPATIENT OCCUPATIONAL THERAPY TREATMENT NOTE ? ? ?Patient Name: Amanda Rich ?MRN: [id number] ?DOB:1966-07-10, 56 y.o., female ?Today's Date: 05/09/2022 ? ?PCP: Irene Pap, PA-C ?REFERRING PROVIDER: Dr. Ranell Patrick ? ?END OF SESSION:  ? ? ? ?Past Medical History:  ?Diagnosis Date  ? Anemia   ? Asthma   ? Cancer [facility])   ? breast cancer  ? Depression   ? Dyspnea   ? Family history of breast cancer   ? Family history of liver cancer   ? History of radiation therapy 03/10/21-04/11/21  ? Right breast- Dr. Gery Pray  ? Pericarditis   ? Smoker   ? Umbilical hernia   ? ?Past Surgical History:  ?Procedure Laterality Date  ? ABLATION ON ENDOMETRIOSIS    ? BREAST BIOPSY Right   ? BREAST LUMPECTOMY WITH RADIOACTIVE SEED AND SENTINEL LYMPH NODE BIOPSY Right 02/02/2021  ? Procedure: RIGHT BREAST LUMPECTOMY WITH RADIOACTIVE SEED AND RIGHT SENTINEL LYMPH NODE MAPPING;  Surgeon: [name], MD;  Location: [place];  Service: General;  Laterality: Right;  ? GANGLION CYST EXCISION  12/25/2005  ? L wrist; [facility], [place]  ? IR ANGIO INTRA EXTRACRAN SEL INTERNAL CAROTID BILAT MOD SED  02/06/2022  ? IR ANGIO INTRA EXTRACRAN SEL INTERNAL CAROTID UNI R MOD SED  03/10/2022  ? IR ANGIO VERTEBRAL SEL VERTEBRAL BILAT MOD SED  02/06/2022  ? IR ANGIOGRAM FOLLOW UP STUDY  03/14/2022  ? IR IMAGING GUIDED PORT INSERTION  09/10/2020  ? IR NEURO EACH ADD'L AFTER BASIC UNI RIGHT (MS)  03/14/2022  ? IR TRANSCATH/EMBOLIZ  03/10/2022  ? RADIOLOGY WITH ANESTHESIA N/A 03/10/2022  ? Procedure: Pipeline Embolization of Right Internal Carotid Artery Aneurysm;  Surgeon: Consuella Lose, MD;  Location: [place];  Service: Radiology;  Laterality: N/A;  ? TUBAL LIGATION    ? [facility]  ? UMBILICAL HERNIA REPAIR  12/26/2003  ? [place]  ? ?Patient Active Problem List  ? Diagnosis Date Noted  ? 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 Subcortical infarction ([place]) 03/14/2022  ? S/P aneurysm repair 03/10/2022  ?  Cerebral aneurysm, nonruptured 03/10/2022  ? Pain in right shoulder 02/02/2022  ? Carotid aneurysm, right ([place]) 12/22/2021  ? Muscle spasm of right leg 12/08/2021  ? Chemotherapy-induced neuropathy ([place]) 11/14/2021  ? Family history of breast cancer   ? Family history of liver cancer   ? Malignant neoplasm of 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, estrogen receptor positive ([place]) 08/25/2020  ? Abnormal chest CT 01/30/2019  ? History of pericarditis 01/30/2019  ? Pericarditis 01/22/2019  ? Chest pain 12/26/2018  ? History of endometrial ablation 01/21/2018  ? Family history of colon cancer 09/24/2017  ? Poor diet 01/24/2017  ? Weight loss, abnormal 01/24/2017  ? Smoker 09/22/2016  ? Mixed hyperlipidemia 08/07/2016  ? Irregular intermenstrual bleeding 08/07/2016  ? Depression 08/07/2016  ? Fat necrosis of peritoneum ([place]) 08/07/2016  ? Pelvic congestion syndrome 08/07/2016  ? ? ?ONSET DATE: 03/10/22 ? ?REFERRING DIAG: I63.9 (ICD-10-CM) - Cerebral infarction, unspecified, ?56 yo F was hospitalized 03/10/22 for s/p Pipeline embolization of RICA aneurysm.  Pt was d/c from rehab 03/24/22. PMH includes asthma, anemia, depression, breast cancer s/p chemo, pericarditis,  S/P axillary lymph node dissection for breast carcinoma 10 months ago; currently in remission from Rt breast cancer, hx of subacromial injection . Pt was receiving PT for R shoulder prior to her surgery. ?  ?DIAGNOSTIC FINDINGS: RUE ?MRI IMPRESSION: ?1. Severe tendinosis of the supraspinatus  tendon with a high-grade ?partial-thickness articular surface tear and subcortical reactive ?marrow changes. ?2. Moderate tendinosis of the infraspinatus tendon. ?3. Mild tendinosis of the subscapularis tendon. ?4. Moderate tendinosis of the intra-articular portion of the long ?head of the biceps tendon. ?5. Mild subacromial/subdeltoid bursitis. ?  ?  ? ?THERAPY DIAG:  ?No diagnosis found. ? ? ?PERTINENT HISTORY: PMH includes asthma, anemia, depression, breast  cancer s/p chemo, pericarditis,  S/P axillary lymph node dissection for breast carcinoma 10 months ago; currently in remission from Rt breast cancer, hx of subacromial injection . Pt was receiving PT for R shoulder prior to her surgery. ? ?PRECAUTIONS: hx of breast CA, fall risk , see MRI for R shoulder above, pt was receiving PT for shoulder prior to CVA ? ?SUBJECTIVE: Pt denies pain ? ?PAIN:  ?Are you having pain? No ? ? ? ? ? ?TODAY'S TREATMENT:  ? ?Pt placing small pegs in pegboard LUE on vertical surface requiring b/t 70-90* sh flexion while copying peg design with 100% accuracy. Pt reports pain Lt shoulder 5/10 with this activity. Gripper set at level 2 resistance to pick up blocks for sustained grip strength Lt hand.  ? ?UBE x 5 min, level 1 for reciprocal movement pattern ? ?PATIENT EDUCATION: ?04/18/22: Education details: BUE sh flexion HEP supine (no pain either arm) ?Person educated: Patient ?Education method: Explanation, Demonstration, and Handouts ?Education comprehension: verbalized understanding and returned demonstration ? ? ? ?PATIENT EDUCATION: ?Education details:  coordination HEP (04/11/22) ?Person educated: Patient ?Education method: Explanation, demonstration ?Education comprehension: verbalized understanding, returned demonstration, min v.c  ? ? ? ?GOALS: ? ? ?SHORT TERM GOALS: Target date: 04/26/2022 ? ?I with initial HEP ?Baseline:dependent ?Goal status: ONGOING ? ?2.  I with energy conservation techniques and activity modification prn. ?Baseline: dependent ?Goal status: INITIAL ? ?3.  Pt will demonstrate improved LUE functional use as evidenced by increasing  LUE box/ blocks to 41 blocks or greater ?Baseline: Box and Blocks:  Right 75 blocks, Left 37 blocks ?Goal status: INITIAL ? ?4.  I with all basic ADLs ?Baseline: supervision ?Goal status: achieved ? ?5.  Pt will perform stove top cooking with supervision, min v.c demonstrating good safety awareness. ?Baseline: currently dependent for  stovetop cooking, using air fryer ?Goal status: ONGOING (demo in clinic on 04/11/22) ? ? ?LONG TERM GOALS: Target date: 05/31/22 ? ?I with updated HEP ?Baseline: dependent ?Goal status: INITIAL ? ?2.  Pt will demonstrate improved fine motor coordination for ADLS as evidenced by decreasing 9 hole peg test by 3 secs. ?Baseline: 9 Hole Peg test: Right: 21.72 sec; Left: 42.94 sec ?Goal status: INITIAL ? ?3.  Pt will increase left grip strength by 5 lbs for increased functional use during ADLs. ?Baseline: R 70.7 lbs, L 45.5 lbs ?Goal status: INITIAL ? ?4.  Pt will perform stovetop cooking modified independently demonstrating good safety awareness. ? ?Goal status: ongoing  ? ?5.  Pt will perform moderate level home management modified independently demonstrating ?good safety awareness. ?Baseline: performs only very light home management activities. ?Goal status: INITIAL ? ? ? ?ASSESSMENT: ? ?CLINICAL IMPRESSION: ?Pt is progressing towards STG's. Pt fatigues easily LUE ? ?PERFORMANCE DEFICITS in functional skills including ADLs, IADLs, coordination, dexterity, sensation, ROM, strength, pain, flexibility, FMC, GMC, balance, endurance, decreased knowledge of precautions, decreased knowledge of use of DME, and UE functional use, which impedes performance of ADLS/ IADLS. ? ?IMPAIRMENTS are limiting patient from ADLs, IADLs, work, leisure, and social participation.  ? ?COMORBIDITIES may have co-morbidities  that affects  occupational performance. Patient will benefit from skilled OT to address above impairments and improve overall function. ? ?MODIFICATION OR ASSISTANCE TO COMPLETE EVALUATION: No modification of tasks or assist necessary to complete an evaluation. ? ?OT OCCUPATIONAL PROFILE AND HISTORY: Problem focused assessment: Including review of records relating to presenting problem. ? ?CLINICAL DECISION MAKING: LOW - limited treatment options, no task modification necessary ? ?REHAB POTENTIAL: Good ? ?EVALUATION COMPLEXITY:  Low ? ? ? ?PLAN: ?OT FREQUENCY: eval plus 1x week x 8 weeks or 9 visits  (pt to be seen for eval an 1 visit during initial week) ? ?OT DURATION: 9 weeks ? ?PLANNED INTERVENTIONS: self care/ADL training, therapeu

## 2022-05-10 ENCOUNTER — Inpatient Hospital Stay: Payer: Medicaid Other | Attending: Hematology and Oncology | Admitting: Adult Health

## 2022-05-10 ENCOUNTER — Other Ambulatory Visit: Payer: Self-pay

## 2022-05-10 ENCOUNTER — Other Ambulatory Visit (HOSPITAL_COMMUNITY): Payer: Self-pay

## 2022-05-10 ENCOUNTER — Encounter: Payer: Self-pay | Admitting: Adult Health

## 2022-05-10 VITALS — BP 118/77 | HR 72 | Temp 97.9°F | Resp 16 | Ht 65.0 in | Wt 128.6 lb

## 2022-05-10 DIAGNOSIS — Z79811 Long term (current) use of aromatase inhibitors: Secondary | ICD-10-CM | POA: Insufficient documentation

## 2022-05-10 DIAGNOSIS — C50211 Malignant neoplasm of upper-inner quadrant of right female breast: Secondary | ICD-10-CM | POA: Diagnosis not present

## 2022-05-10 DIAGNOSIS — Z170 Estrogen receptor positive status [ER+]: Secondary | ICD-10-CM

## 2022-05-10 DIAGNOSIS — Z79899 Other long term (current) drug therapy: Secondary | ICD-10-CM | POA: Diagnosis not present

## 2022-05-10 MED ORDER — PREDNISONE 50 MG PO TABS
ORAL_TABLET | ORAL | 0 refills | Status: DC
  Filled 2022-05-10: qty 3, 1d supply, fill #0

## 2022-05-10 NOTE — Progress Notes (Signed)
SURVIVORSHIP VISIT: ? ? ? ?BRIEF ONCOLOGIC HISTORY:  ?Oncology History  ?Malignant neoplasm of 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, estrogen receptor positive ([place])  ?08/25/2020 Initial Diagnosis  ? Patient palpated a right breast lump x51yrand a left breast lump x272yrDiagnostic mammogram and USKoreahowed in the right breast, a 2.7cm mass 5cm from the nipple and 0.8cm mass 1cm from the nipple at the 12:30 position, with borderline cortical thickening in the right axilla, and in the left breast, a 4.0cm mass at the 11 o'clock position representing a hamartoma. 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 ?  ?09/13/2020 - 01/18/2021 Chemotherapy  ? Neoadjuvant chemotherapy with TCKeystoneerjeta ? ? ?  ? ?  ?02/02/2021 Surgery  ? Right lumpectomy ([facility]): no residual carcinoma, 4 right axillary lymph nodes negative for carcinoma. ?  ?02/21/2021 - 08/30/2021 Chemotherapy  ?  ? ? Patient is on Antibody Plan: BREAST TRASTUZUMAB + PERTUZUMAB Q21D  ? ?  ?03/11/2021 - 04/11/2021 Radiation Therapy  ? Adjuvant radiation ?  ?09/2021 -  Anti-estrogen oral therapy  ? Anastrozole daily ?  ? ? ?INTERVAL HISTORY:  ?Ms. Mealor to review her survivorship care plan detailing her treatment course for breast cancer, as well as monitoring long-term side effects of that treatment, education regarding health maintenance, screening, and overall wellness and health promotion.    ? ?Overall, Ms. JoTibbettseports feeling well.  She tells me that she had a stroke following stent placement for brain aneurysm and has been working with PT to regain her strength.  She has slowly improved her mobility and is walking about 1 mile per day (slowly) on her treadmill.  She denies any breast changes today.  She underwent mammogram in 01/2022 that was negative for malignancy with breast density category C.   ? ?She noted an  enlarged cervical lymph node about a week ago.  She denies any recent infections or immunizations.  She is taking anastrozole daily with good tolerance.  ? ?REVIEW OF SYSTEMS:  ?Review of Systems  ?Constitutional:  Positive for fatigue. Negative for appetite change, chills, fever and unexpected weight change.  ?HENT:   Negative for hearing loss, lump/mass and trouble swallowing.   ?Eyes:  Negative for eye problems and icterus.  ?Respiratory:  Negative for chest tightness, cough and shortness of breath.   ?Cardiovascular:  Negative for chest pain, leg swelling and palpitations.  ?Gastrointestinal:  Negative for abdominal distention, abdominal pain, constipation, diarrhea, nausea and vomiting.  ?Endocrine: Negative for hot flashes.  ?Genitourinary:  Negative for difficulty urinating.   ?Musculoskeletal:  Negative for arthralgias.  ?Skin:  Negative for itching and rash.  ?Neurological:  Positive for extremity weakness (left sided residual weakness from CVA) and numbness. Negative for dizziness and headaches.  ?Hematological:  Negative for adenopathy. Does not bruise/bleed easily.  ?Psychiatric/Behavioral:  Negative for depression. The patient is not nervous/anxious.   ?Breast: Denies any new nodularity, masses, tenderness, nipple changes, or nipple discharge.  ? ? ? ? ?ONCOLOGY TREATMENT TEAM:  ?1. Surgeon:  [facility]. [facility]Brantley St[facility] ?2. Medical Oncologist: Dr. GuLindi Adie?3. Radiation Oncologist: Dr. KiSondra Come  ? ?PAST MEDICAL/SURGICAL HISTORY:  ?Past Medical History:  ?Diagnosis Date  ? Anemia   ? Asthma   ? [facility]  [facility]  ? Depression   ? Dyspnea   ?  Family history of breast cancer   ? Family history of liver cancer   ? History of radiation therapy 03/10/21-04/11/21  ? Right breast- Dr. Gery Pray  ? Pericarditis   ? Smoker   ? Umbilical hernia   ? ?Past Surgical History:  ?Procedure Laterality Date  ? ABLATION ON ENDOMETRIOSIS    ? BREAST BIOPSY Right   ? BREAST LUMPECTOMY WITH  RADIOACTIVE SEED AND SENTINEL LYMPH NODE BIOPSY Right 02/02/2021  ? Procedure: RIGHT BREAST LUMPECTOMY WITH RADIOACTIVE SEED AND RIGHT SENTINEL LYMPH NODE MAPPING;  Surgeon: [name], MD;  Location: [place];  Service: General;  Laterality: Right;  ? GANGLION CYST EXCISION  12/25/2005  ? L wrist; [facility], [place]  ? IR ANGIO INTRA EXTRACRAN SEL INTERNAL CAROTID BILAT MOD SED  02/06/2022  ? IR ANGIO INTRA EXTRACRAN SEL INTERNAL CAROTID UNI R MOD SED  03/10/2022  ? IR ANGIO VERTEBRAL SEL VERTEBRAL BILAT MOD SED  02/06/2022  ? IR ANGIOGRAM FOLLOW UP STUDY  03/14/2022  ? IR IMAGING GUIDED PORT INSERTION  09/10/2020  ? IR NEURO EACH ADD'L AFTER BASIC UNI RIGHT (MS)  03/14/2022  ? IR TRANSCATH/EMBOLIZ  03/10/2022  ? RADIOLOGY WITH ANESTHESIA N/A 03/10/2022  ? Procedure: Pipeline Embolization of Right Internal Carotid Artery Aneurysm;  Surgeon: Consuella Lose, MD;  Location: [place];  Service: Radiology;  Laterality: N/A;  ? TUBAL LIGATION    ? [facility]  ? UMBILICAL HERNIA REPAIR  12/26/2003  ? [place]  ? ? ? ?ALLERGIES:  ?Allergies  ?Allergen Reactions  ? Clarithromycin Swelling  ?  Biaxin  ? Lyrica [Pregabalin]   ?  Oversedated   ? Tape   ?  Rash from Tegaderm  ? Ivp Dye [Iodinated Contrast Media] Rash  ? ? ? ?CURRENT MEDICATIONS:  ?Outpatient Encounter Medications as of 05/10/2022  ?Medication Sig  ? acetaminophen (TYLENOL) 325 MG tablet Take 2 tablets (650 mg total) by mouth every 4 (four) hours as needed for headache or moderate pain.  ? albuterol (PROAIR HFA) 108 (90 Base) MCG/ACT inhaler Inhale 2 puffs into the lungs every 6 (six) hours as needed for wheezing or shortness of breath.  ? anastrozole (ARIMIDEX) 1 MG tablet Take 1 tablet (1 mg total) by mouth daily.  ? aspirin 81 MG chewable tablet Chew 1 tablet (81 mg total) by mouth daily.  ? baclofen (LIORESAL) 10 MG tablet Take 1 tablet (10 mg total) by mouth 3 (three) times daily as needed for muscle spasms.  ? budesonide-formoterol (SYMBICORT) 80-4.5  MCG/ACT inhaler Inhale 2 puffs into the lungs in the morning and at bedtime.  ? Calcium-Vitamin D-Vitamin K (VIACTIV PO) Take 1 tablet by mouth daily.  ? Cholecalciferol 25 MCG (1000 UT) tablet Take 2 tablets (2,000 Units total) by mouth daily.  ? citalopram (CELEXA) 10 MG tablet Take 1 tablet (10 mg total) by mouth daily.  ? gabapentin (NEURONTIN) 300 MG capsule Take 1 capsule (300 mg total) by mouth 2 (two) times daily.  ? HYDROcodone-acetaminophen (NORCO/VICODIN) 5-325 MG tablet Take 1 tablet by mouth every 4 (four) hours as needed for moderate pain. (Patient not taking: Reported on 03/28/2022)  ? loratadine (CLARITIN) 10 MG tablet Take 1 tablet (10 mg total) by mouth daily.  ? magnesium oxide (MAG-OX) 400 (240 Mg) MG tablet Take 1 tablet (400 mg total) by mouth at bedtime.  ? magnesium oxide (MAG-OX) 400 MG tablet Take 1/2 tablet (200 mg total) by mouth at bedtime.  ? pantoprazole (PROTONIX) 40 MG tablet Take 1  tablet (40 mg total) by mouth at bedtime. (Patient not taking: Reported on 03/28/2022)  ? ticagrelor (BRILINTA) 90 MG TABS tablet Take 1 tablet (90 mg total) by mouth 2 (two) times daily.  ? Tiotropium Bromide Monohydrate (SPIRIVA RESPIMAT) 2.5 MCG/ACT AERS Inhale 2 puffs into the lungs daily.  ? umeclidinium bromide (INCRUSE ELLIPTA) 62.5 MCG/ACT AEPB Inhale 1 puff into the lungs daily.  ? [DISCONTINUED] prochlorperazine (COMPAZINE) 10 MG tablet Take 1 tablet (10 mg total) by mouth every 6 (six) hours as needed (Nausea or vomiting).  ? ?No facility-administered encounter medications on file as of 05/10/2022.  ? ? ? ?ONCOLOGIC FAMILY HISTORY:  ?Family History  ?Problem Relation Age of Onset  ? Diabetes Mother   ? Hypertension Mother   ? Liver cancer Daughter 31  ? Diabetes Maternal Aunt   ? Breast cancer Maternal Aunt 63  ? Colon cancer Maternal Uncle 66  ?     or prostate cancer  ? Colon cancer Cousin   ? Breast cancer Cousin   ? Autism Half-Brother   ? Breast cancer Other 2  ?     maternal second cousin  ?  Diabetes Paternal Uncle   ? Breast cancer Other   ?     dx. 102s, paternal second cousin  ? Lung cancer Other   ?     dx. 102s, paternal cousin once removed (father's cousin)  ? ? ? ?GENETIC COUNSELING/TESTING: ?Dec

## 2022-05-16 ENCOUNTER — Encounter: Payer: Medicaid Other | Admitting: Occupational Therapy

## 2022-05-16 ENCOUNTER — Ambulatory Visit: Payer: Medicaid Other | Admitting: Physical Therapy

## 2022-05-17 ENCOUNTER — Other Ambulatory Visit (HOSPITAL_COMMUNITY): Payer: Self-pay

## 2022-05-18 ENCOUNTER — Ambulatory Visit (HOSPITAL_COMMUNITY)
Admission: RE | Admit: 2022-05-18 | Discharge: 2022-05-18 | Disposition: A | Discharge status: Home / Self Care | Payer: Medicaid Other | Source: Ambulatory Visit | Attending: Adult Health | Admitting: Adult Health

## 2022-05-18 DIAGNOSIS — Z170 Estrogen receptor positive status [ER+]: Secondary | ICD-10-CM | POA: Diagnosis not present

## 2022-05-18 DIAGNOSIS — J439 Emphysema, unspecified: Secondary | ICD-10-CM | POA: Diagnosis not present

## 2022-05-18 DIAGNOSIS — C50211 Malignant neoplasm of upper-inner quadrant of right female breast: Secondary | ICD-10-CM | POA: Diagnosis not present

## 2022-05-18 DIAGNOSIS — R221 Localized swelling, mass and lump, neck: Secondary | ICD-10-CM | POA: Diagnosis not present

## 2022-05-18 DIAGNOSIS — J8410 Pulmonary fibrosis, unspecified: Secondary | ICD-10-CM | POA: Diagnosis not present

## 2022-05-18 IMAGING — CT CT CHEST W/ CM
2 of 4 series · 15 of 36 positions shown, 18 images · IV contrast (agent unspecified)
Comparison: [DATE].

CLINICAL DATA: Invasive breast cancer, stage IV.

EXAM:
CT CHEST WITH CONTRAST
TECHNIQUE: Multidetector CT imaging of the chest was performed during
intravenous contrast administration.

[Series 5: lungs · axial · 0.65mm/px · z∈[+982,+1276]mm · 12 of 165 slices shown, 15 images]
[im 9/165  mediastinal]
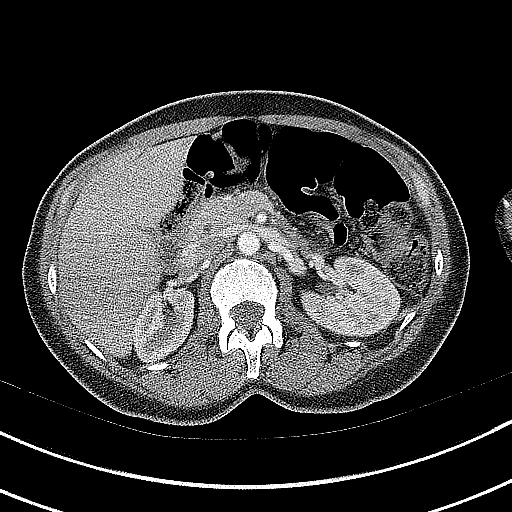
[im 9/165  lung]
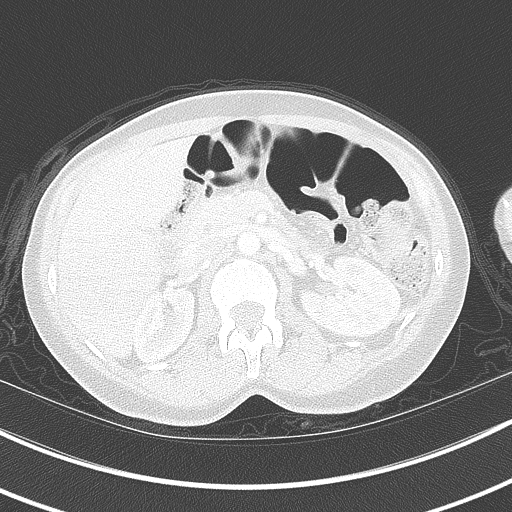
[im 26/165  lung]
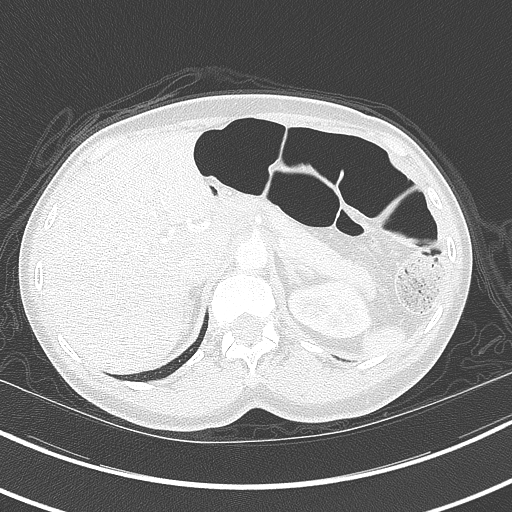
[im 35/165  lung]
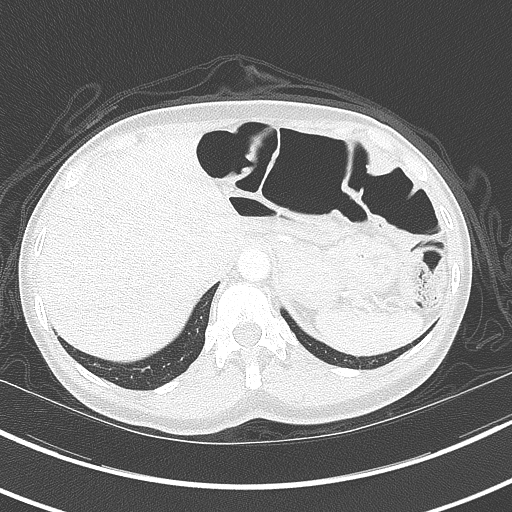
[im 52/165  lung]
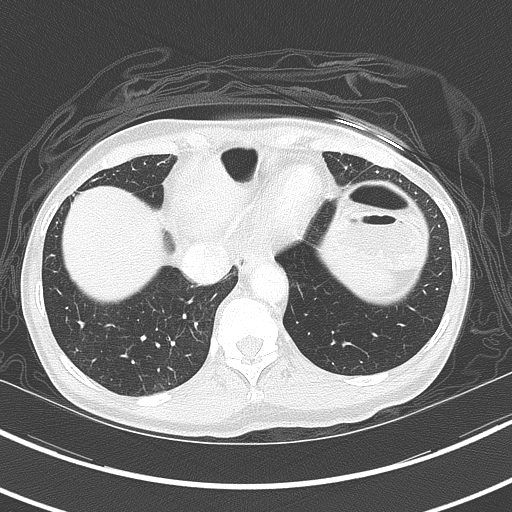
[im 61/165  mediastinal]
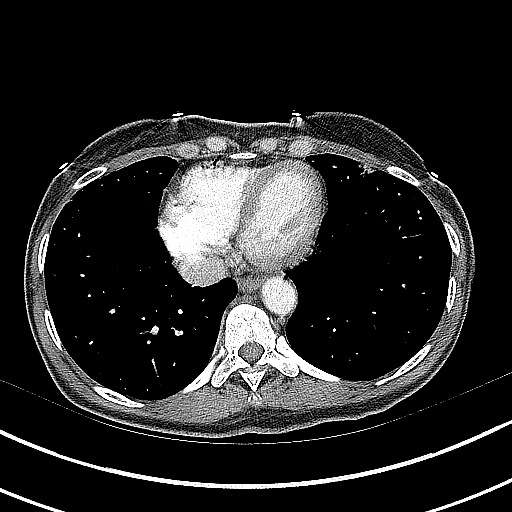
[im 61/165  lung]
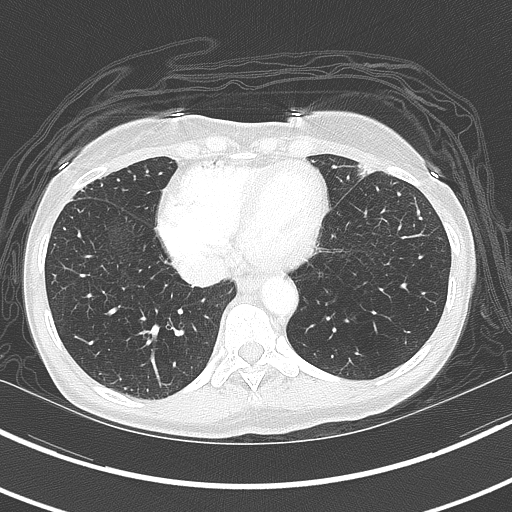
[im 78/165  lung]
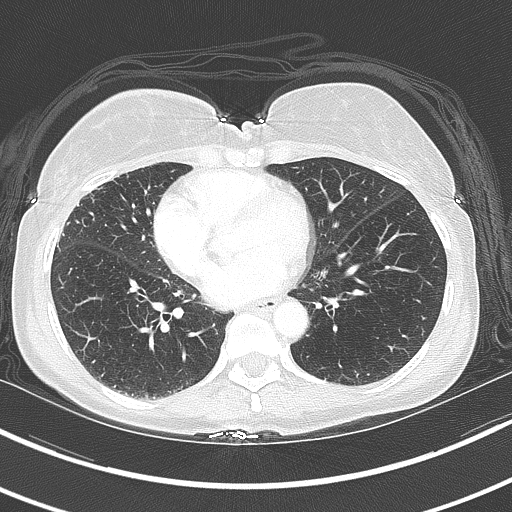
[im 87/165  lung]
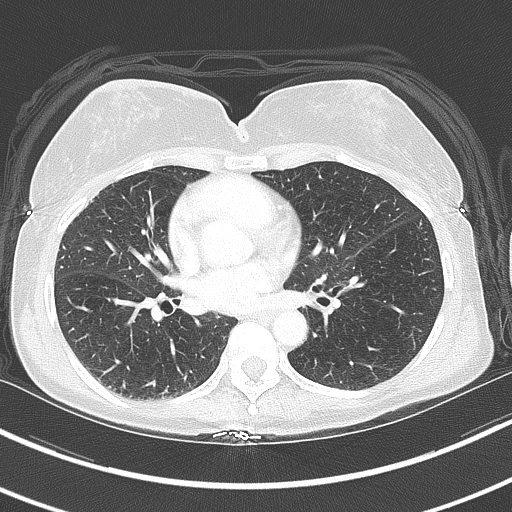
[im 104/165  lung]
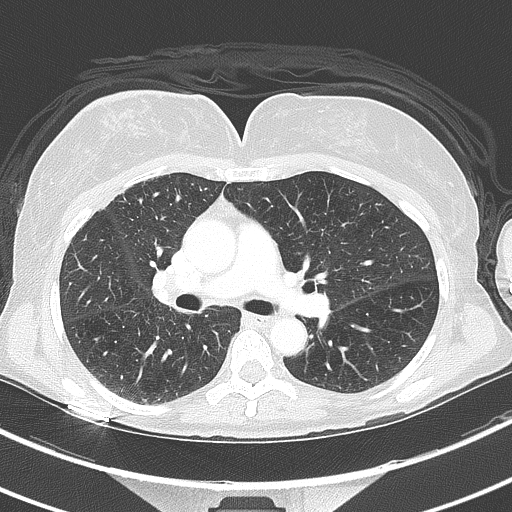
[im 113/165  mediastinal]
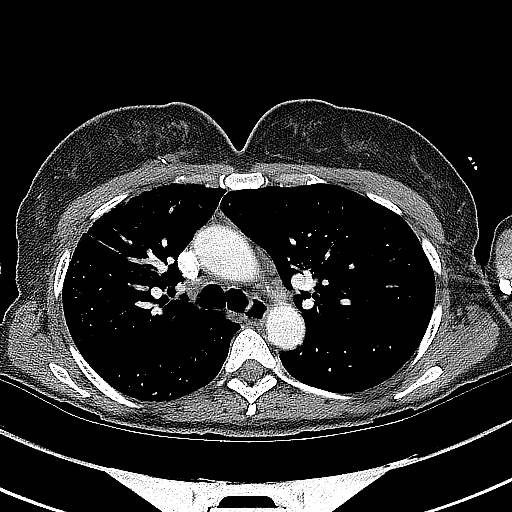
[im 113/165  lung]
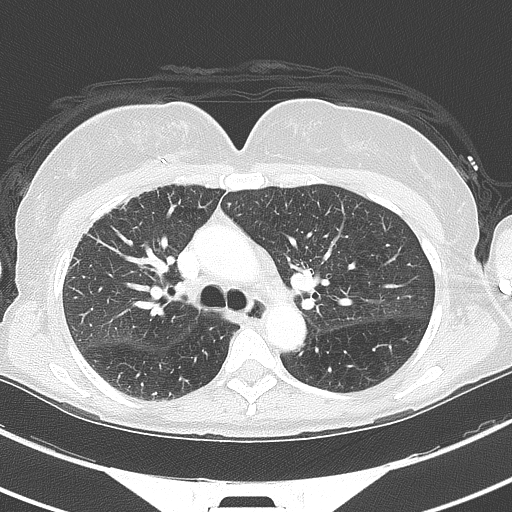
[im 130/165  lung]
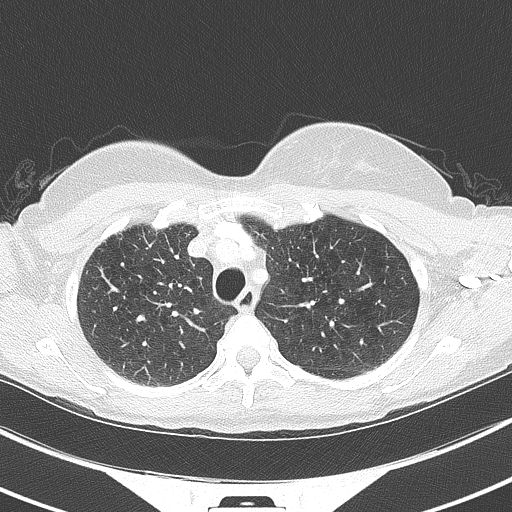
[im 139/165  lung]
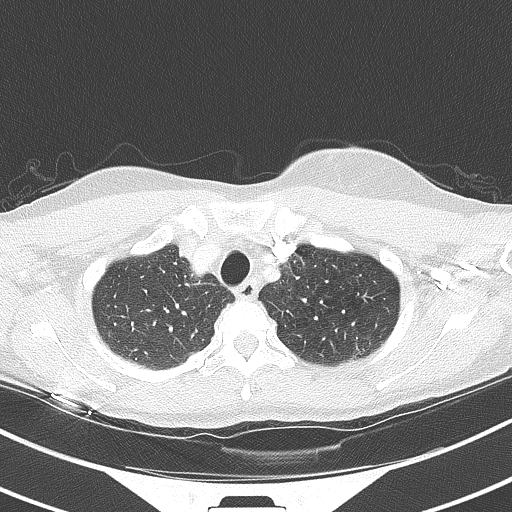
[im 156/165  lung]
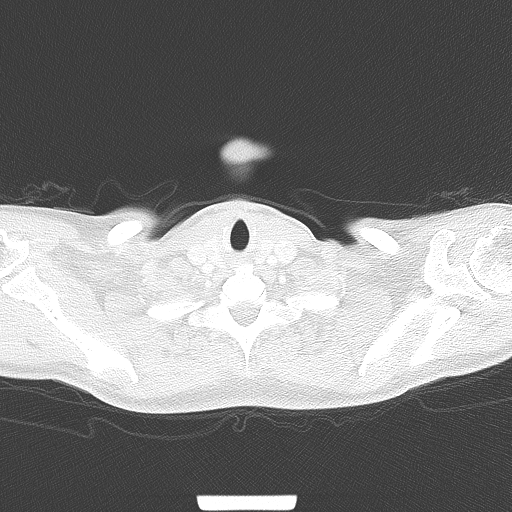

[Series 6: coronal · coronal · 0.64mm/px · 3 of 130 slices shown]
[im 26/130  lung]
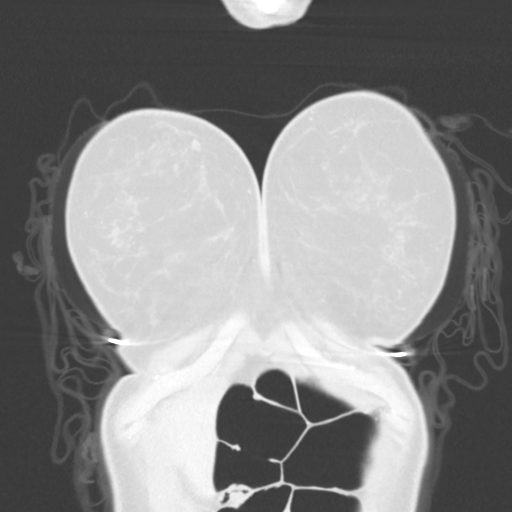
[im 52/130  lung]
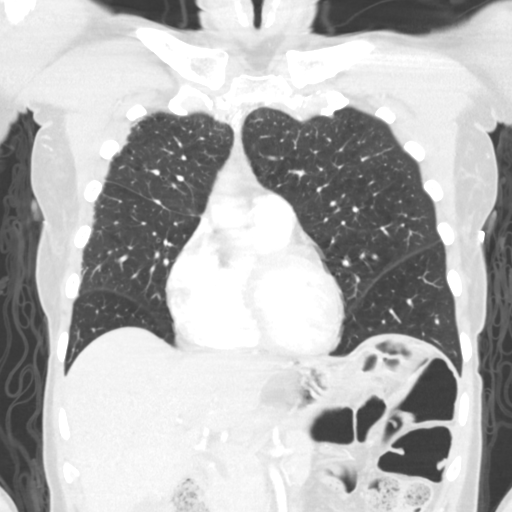
[im 78/130  lung]
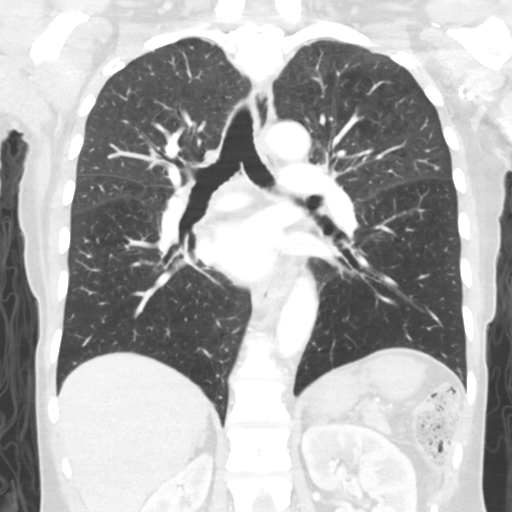

[15 of 36 positions shown; findings below may reference images not displayed]

RADIATION DOSE REDUCTION: This exam was performed according to the
departmental dose-optimization program which includes automated
exposure control, adjustment of the mA and/or kV according to
patient size and/or use of iterative reconstruction technique.

CONTRAST:  75mL OMNIPAQUE IOHEXOL 300 MG/ML  SOLN
FINDINGS: Cardiovascular: The heart is normal in size and there is no
pericardial effusion. The aorta and pulmonary trunk are normal in
caliber.

Mediastinum/Nodes: No mediastinal, hilar, or axillary
lymphadenopathy. A 5 mm hypodensity is present in the right lobe of
the thyroid gland. No additional follow-up imaging is recommended.
The trachea and esophagus are unremarkable.

Lungs/Pleura: Mild emphysematous changes are noted in the lungs.
Subpleural fibrotic changes are noted in the anterior aspect of the
right upper lobe, possible sequela radiation therapy. No
consolidation, effusion, or pneumothorax. No pulmonary nodule or
mass.

Upper Abdomen: Mild gastric wall thickening at the fundus.

Musculoskeletal: Surgical changes are noted in the right breast.
Degenerative changes are present in the cervical spine. No acute or
suspicious osseous abnormality.
IMPRESSION: 1. Postsurgical changes in the right breast. No evidence of
metastatic disease.
2. Mild gastric wall thickening, possible infectious or inflammatory
gastritis.

## 2022-05-18 IMAGING — CT CT NECK W/ CM
5 of 6 series · 14 of 33 positions shown, 16 images · IV contrast (agent unspecified)
Comparison: None Available.

CLINICAL DATA: Neck mass, nonpulsatile. History of breast cancer
with right cervical swelling.

EXAM:
CT NECK WITH CONTRAST
TECHNIQUE: Multidetector CT imaging of the neck was performed using the
standard protocol following the bolus administration of intravenous
contrast.

[Series 3: axial neck · axial · 0.42mm/px · z∈[+1258,+1330]mm · 2 of 109 slices shown]
[im 37/109  bone]
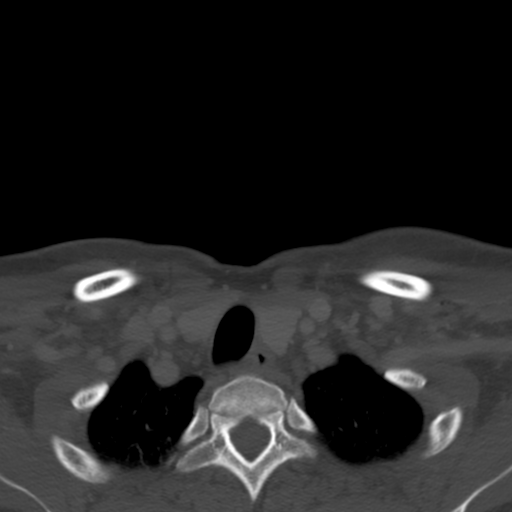
[im 73/109  bone]
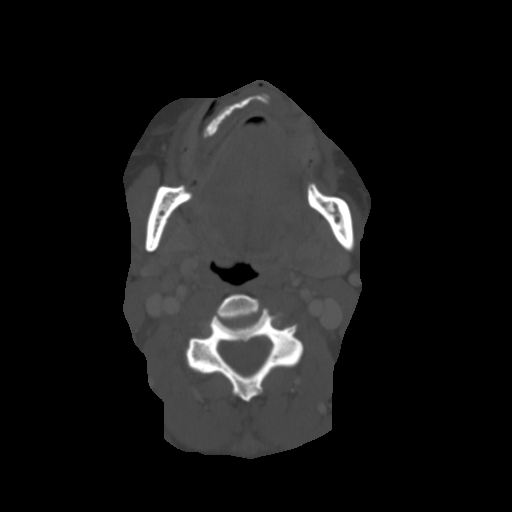

[Series 5: axial bone · axial · 0.42mm/px · z∈[+1258,+1330]mm · 2 of 109 slices shown]
[im 37/109  bone]
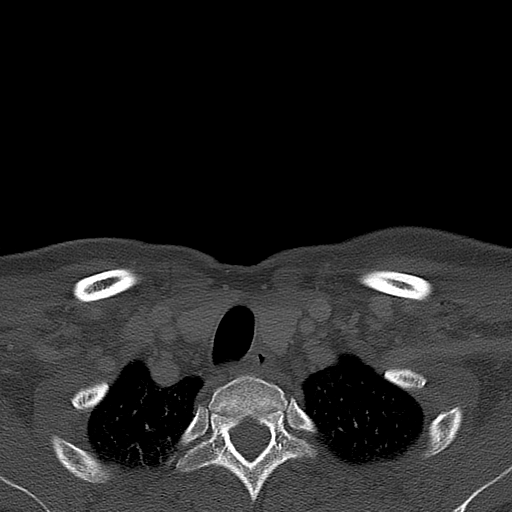
[im 73/109  bone]
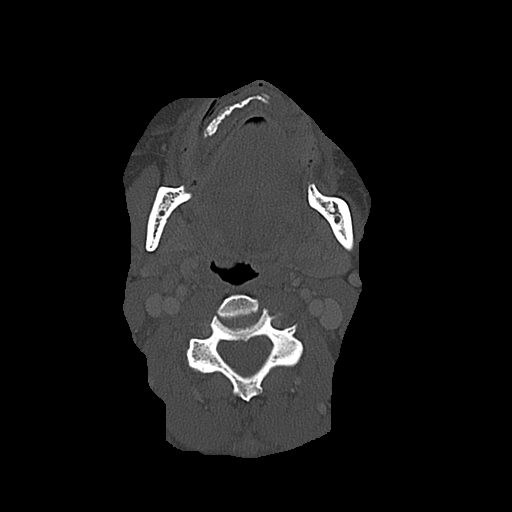

[Series 7: orthogonal (person_name) · axial · 0.39mm/px · z∈[+1236,+1316]mm · 2 of 125 slices shown, 3 images]
[im 42/125  soft-tissue]
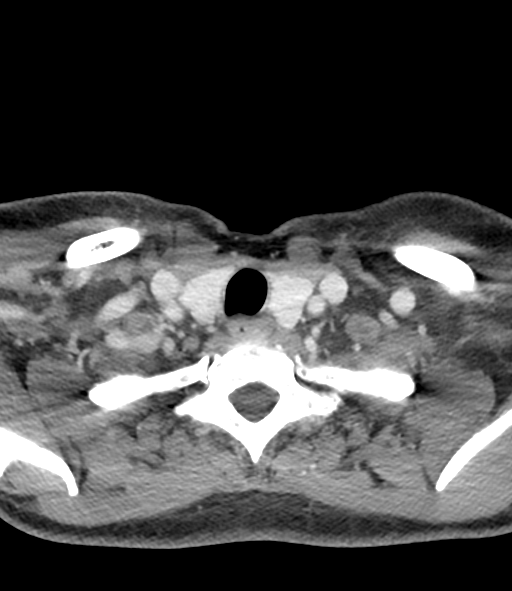
[im 42/125  bone]
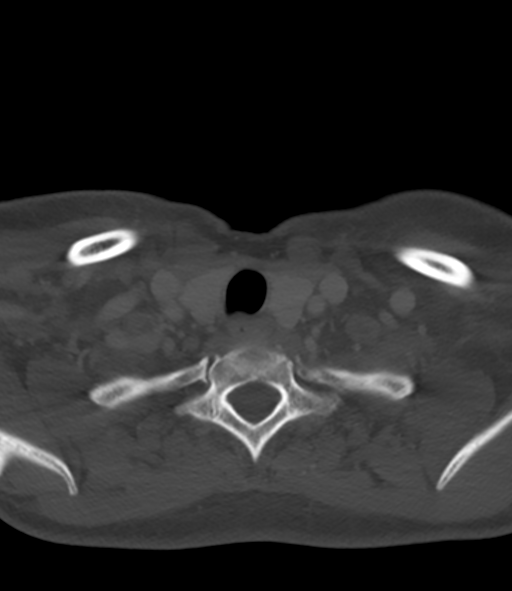
[im 83/125  bone]
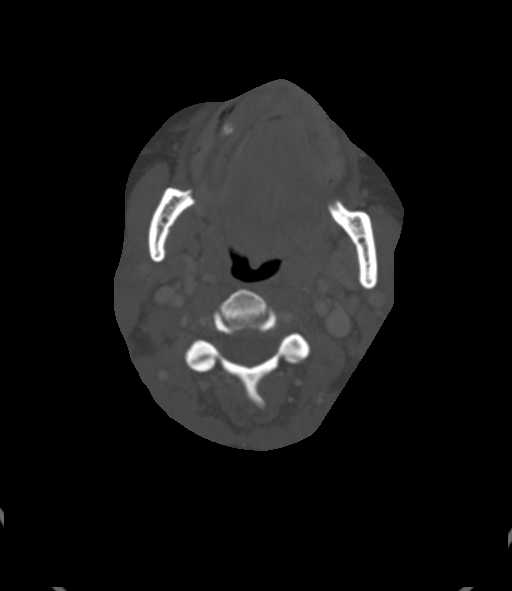

[Series 8: cor neck · coronal · 0.39mm/px · 3 of 116 slices shown]
[im 25/116  bone]
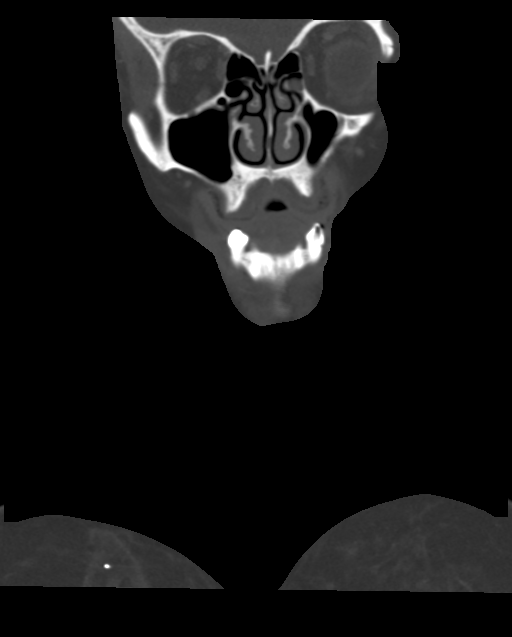
[im 47/116  bone]
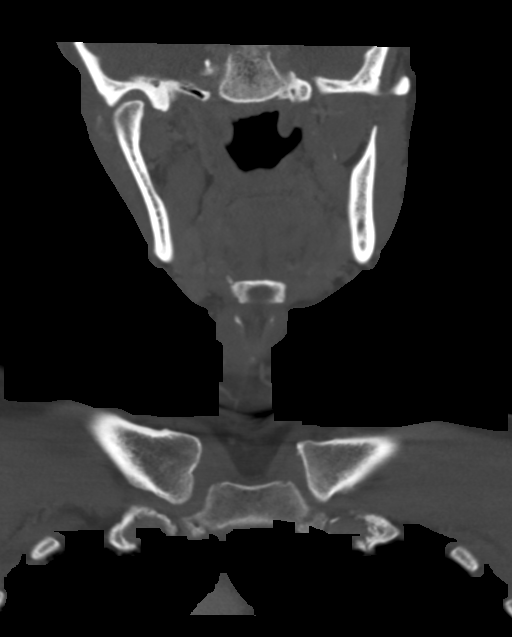
[im 69/116  bone]
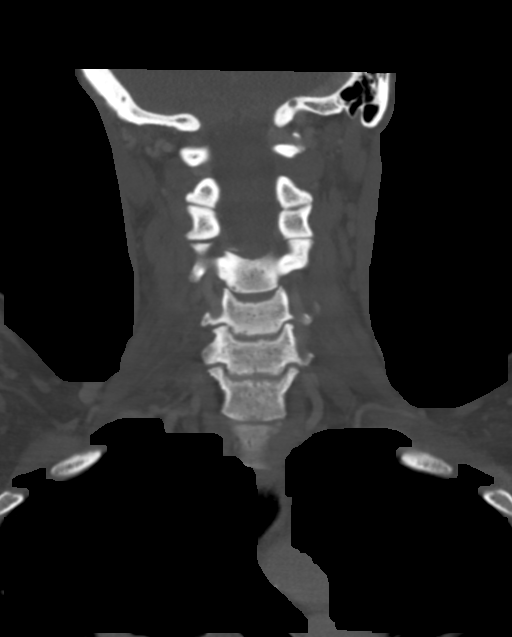

[Series 9: sag neck · sagittal · 0.45mm/px · 5 of 101 slices shown, 6 images]
[im 34/101  bone]
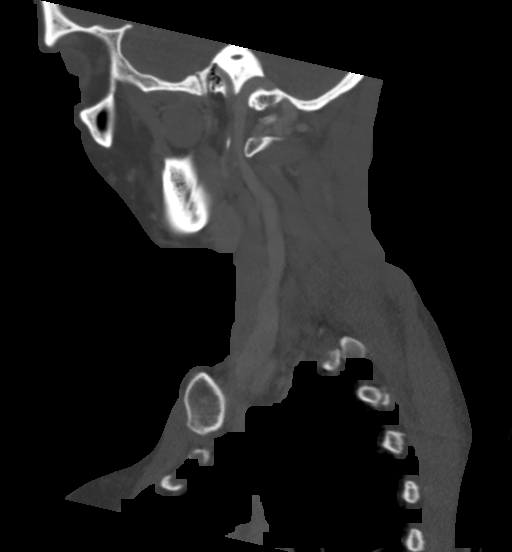
[im 42/101  bone]
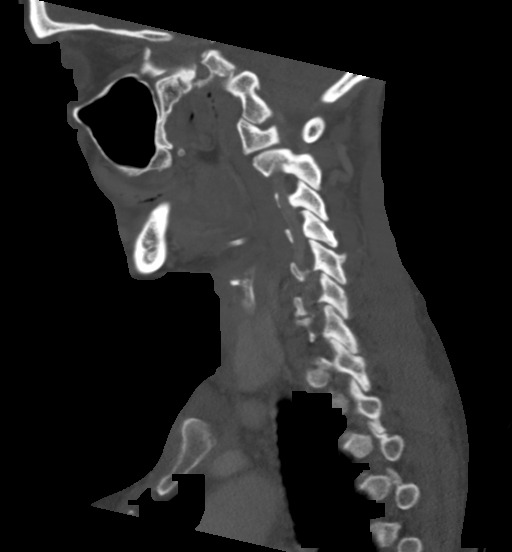
[im 51/101  soft-tissue]
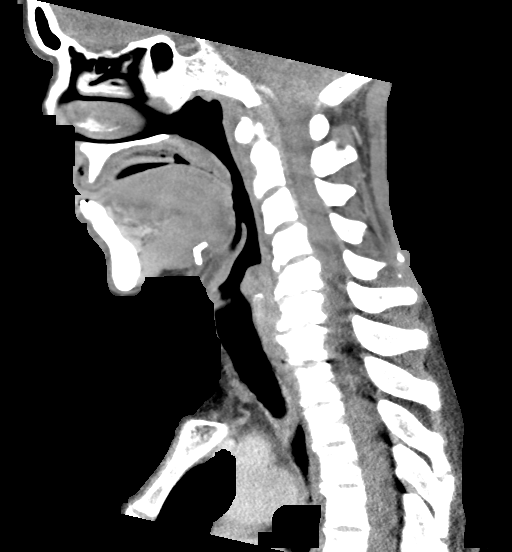
[im 51/101  bone]
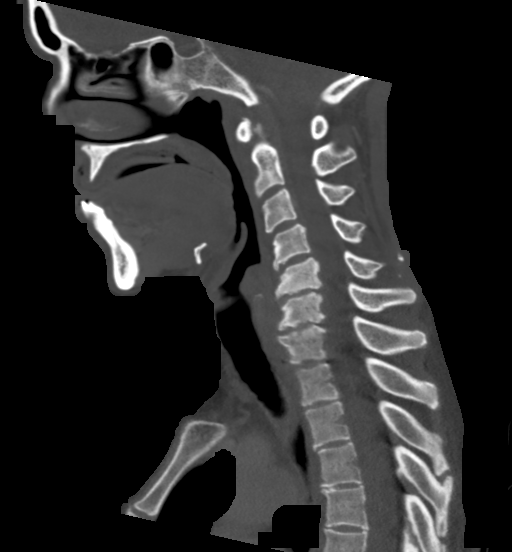
[im 59/101  bone]
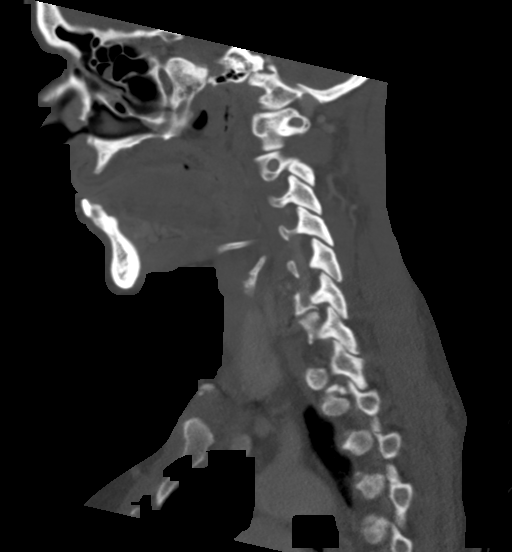
[im 67/101  bone]
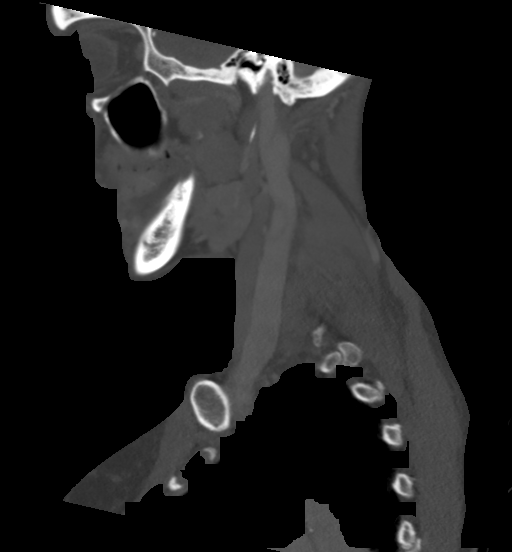

[14 of 33 positions shown; findings below may reference images not displayed]

RADIATION DOSE REDUCTION: This exam was performed according to the
departmental dose-optimization program which includes automated
exposure control, adjustment of the mA and/or kV according to
patient size and/or use of iterative reconstruction technique.

CONTRAST:  75mL OMNIPAQUE IOHEXOL 300 MG/ML  SOLN
FINDINGS: Pharynx and larynx: Normal. No mass or swelling.

Salivary glands: Symmetric mild heterogeneity. No inflammation,
discrete mass, or stone.

Thyroid: 6 mm right thyroid nodule. No followup recommended (ref: [HOSPITAL]. [DATE]): 143-50).

Lymph nodes: Symmetric benign-appearing lymph nodes. The largest
node is in the right posterior triangle with 1 cm length and normal
ovoid shape.

Vascular: Negative.

Limited intracranial: Partial coverage of right ICA stent

Visualized orbits: Negative.

Mastoids and visualized paranasal sinuses: Clear.

Skeleton: No acute or aggressive process.

Upper chest: Postoperative right breast.
IMPRESSION: Benign neck CT.  No mass or worrisome node.

## 2022-05-18 MED ORDER — IOHEXOL 300 MG/ML  SOLN
75.0000 mL | Freq: Once | INTRAMUSCULAR | Status: AC | PRN
  Administered 2022-05-18: 75 mL via INTRAVENOUS

## 2022-05-19 ENCOUNTER — Telehealth: Payer: Self-pay

## 2022-05-19 NOTE — Telephone Encounter (Signed)
-----   Message from Gardenia Phlegm, NP sent at 05/19/2022  2:19 PM EDT ----- Please tell patient scan is good, no cancer or concern for that lymph node we felt.  :)  Great news :) ----- Message ----- From: Interface, Rad Results In Sent: 05/19/2022  12:44 PM EDT To: Gardenia Phlegm, NP

## 2022-05-19 NOTE — Telephone Encounter (Signed)
Called and spoke with pt, per NP pt scan looks good and shows area of concern is not cancer.  Pt verbalized understanding and thanks

## 2022-05-23 ENCOUNTER — Ambulatory Visit: Payer: Medicaid Other | Admitting: Physical Therapy

## 2022-05-23 ENCOUNTER — Encounter: Payer: Medicaid Other | Admitting: Occupational Therapy

## 2022-05-24 ENCOUNTER — Telehealth: Payer: Self-pay

## 2022-05-24 NOTE — Telephone Encounter (Signed)
Pt is aware and verbalized understanding. See MyChart message sent to pt.

## 2022-05-24 NOTE — Progress Notes (Signed)
HEMATOLOGY-ONCOLOGY TELEPHONE VISIT PROGRESS NOTE  I connected with @PTNAME @ on 06/07/22 at  9:15 AM EDT by telephone and verified that I am speaking with the correct person using two identifiers.  I discussed the limitations, risks, security and privacy concerns of performing an evaluation and management service by telephone and the availability of in person appointments.  I also discussed with the patient that there may be a patient responsible charge related to this service. The patient expressed understanding and agreed to proceed.   History of Present Illness: Amanda Rich is a 56 y.o. with above-mentioned history of right breast cancer treated with neoadjuvant chemotherapy followed lumpectomy and radiation and is currently on Herceptin and Perjeta maintenance. She presents to the clinic today via telephone follow-up.    Oncology History  Malignant neoplasm of 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, estrogen receptor positive ([place])  08/25/2020 Initial Diagnosis   Patient palpated a right breast lump x66yrand a left breast lump x279yrDiagnostic mammogram and USKoreahowed in the right breast, a 2.7cm mass 5cm from the nipple and 0.8cm mass 1cm from the nipple at the 12:30 position, with borderline cortical thickening in the right axilla, and in the left breast, a 4.0cm mass at the 11 o'clock position representing a hamartoma. 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   09/13/2020 - 01/18/2021 Chemotherapy   [facility]       02/02/2021 Surgery   Right lumpectomy ([facility]): no residual carcinoma, 4 right axillary lymph nodes negative for carcinoma.   02/21/2021 - 08/30/2021 Chemotherapy      Patient is on Antibody Plan: BREAST TRASTUZUMAB + PERTUZUMAB Q21D     03/11/2021 - 04/11/2021 Radiation Therapy   Adjuvant  radiation   09/2021 -  Anti-estrogen oral therapy   Anastrozole daily     REVIEW OF SYSTEMS:   Constitutional: Denies fevers, chills or abnormal weight loss Eyes: Denies blurriness of vision Ears, nose, mouth, throat, and face: Denies mucositis or sore throat Respiratory: Denies cough, dyspnea or wheezes Cardiovascular: Denies palpitation, chest discomfort Gastrointestinal:  Denies nausea, heartburn or change in bowel habits Skin: Denies abnormal skin rashes Lymphatics: Denies new lymphadenopathy or easy bruising Neurological:Denies numbness, tingling or new weaknesses Behavioral/Psych: Mood is stable, no new changes  Extremities: No lower extremity edema Breast:  denies any pain or lumps or nodules in either breasts All other systems were reviewed with the patient and are negative. Observations/Objective:     Assessment Plan:  Malignant neoplasm of 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, estrogen receptor positive ([facility]CFedoraPatient palpated a right breast lump [id number] a left breast lump [id number] mammogram and US sKoreawed in the right breast, a 2.7cm mass 5cm from the nipple and 0.8cm mass 1cm from the nipple at the 12:30 position, with borderline cortical thickening in the right axilla, and in the left breast, a 4.0cm mass at the 11 o'clock position representing a hamartoma. 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   Treatment plan: 1. Neoadjuvant chemotherapy with [facility] Perjeta 6 cycles completed 01/18/2021 followed by Herceptin Perjeta maintenance for 1 year completed 08/30/2021 2. Followed by breast conserving surgery if possible with sentinel  lymph node study 02/02/2021: Complete pathologic response, 0/4 lymph nodes negative 3. Followed by adjuvant radiation therapy 03/14/21- 04/08/21 4.  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 Does better with Gabapentin  CT Neck  and Chest: 05/20/22: Benign Anastrozole Toxicities: Hot flashes (better if she drinks less coffee)  Mammograms:03/02/22: Benign Moles on back: referred to Dermatology  RTC in 1 year    I discussed the assessment and treatment plan with the patient. The patient was provided an opportunity to ask questions and all were answered. The patient agreed with the plan and demonstrated an understanding of the instructions. The patient was advised to call back or seek an in-person evaluation if the symptoms worsen or if the condition fails to improve as anticipated.   I provided 12 minutes of non-face-to-face time during this encounter. Harriette Ohara, MD  I Gardiner Coins am scribing for Dr. Lindi Adie  I have reviewed the above documentation for accuracy and completeness, and I agree with the above.

## 2022-05-24 NOTE — Telephone Encounter (Signed)
-----   Message from Gardenia Phlegm, NP sent at 05/24/2022 10:57 AM EDT ----- CT scan shows no cancer.  ? Inflammation in stomach, recommend f/u with PCP to evaluate for gastritis  ----- Message ----- From: Interface, Rad Results In Sent: 05/20/2022   2:45 AM EDT To: Gardenia Phlegm, NP

## 2022-05-25 DIAGNOSIS — Z419 Encounter for procedure for purposes other than remedying health state, unspecified: Secondary | ICD-10-CM | POA: Diagnosis not present

## 2022-06-01 ENCOUNTER — Telehealth: Payer: Self-pay

## 2022-06-01 ENCOUNTER — Ambulatory Visit (HOSPITAL_BASED_OUTPATIENT_CLINIC_OR_DEPARTMENT_OTHER)
Admission: RE | Admit: 2022-06-01 | Discharge: 2022-06-01 | Disposition: A | Discharge status: Home / Self Care | Payer: Medicaid Other | Source: Ambulatory Visit | Attending: Adult Health | Admitting: Adult Health

## 2022-06-01 DIAGNOSIS — Z170 Estrogen receptor positive status [ER+]: Secondary | ICD-10-CM | POA: Insufficient documentation

## 2022-06-01 DIAGNOSIS — C50211 Malignant neoplasm of upper-inner quadrant of right female breast: Secondary | ICD-10-CM | POA: Insufficient documentation

## 2022-06-01 DIAGNOSIS — Z780 Asymptomatic menopausal state: Secondary | ICD-10-CM | POA: Diagnosis not present

## 2022-06-01 NOTE — Telephone Encounter (Signed)
Called pt, informed her that recent bone density results are normal.  Pt verbalized understanding and thanks

## 2022-06-01 NOTE — Telephone Encounter (Signed)
-----   Message from Gardenia Phlegm, NP sent at 06/01/2022 10:54 AM EDT ----- Please call patient with her results they are normal. ----- Message ----- From: Interface, Rad Results In Sent: 06/01/2022  10:35 AM EDT To: Gardenia Phlegm, NP

## 2022-06-03 ENCOUNTER — Other Ambulatory Visit (HOSPITAL_COMMUNITY): Payer: Self-pay

## 2022-06-05 ENCOUNTER — Other Ambulatory Visit: Payer: Self-pay

## 2022-06-05 MED ORDER — TICAGRELOR 90 MG PO TABS
90.0000 mg | ORAL_TABLET | Freq: Two times a day (BID) | ORAL | 0 refills | Status: DC
  Filled 2022-06-05: qty 60, 30d supply, fill #0

## 2022-06-05 MED ORDER — LORATADINE 10 MG PO TABS
10.0000 mg | ORAL_TABLET | Freq: Every day | ORAL | 0 refills | Status: DC
  Filled 2022-06-05: qty 30, 30d supply, fill #0

## 2022-06-05 MED ORDER — GABAPENTIN 300 MG PO CAPS
300.0000 mg | ORAL_CAPSULE | Freq: Two times a day (BID) | ORAL | 0 refills | Status: DC
  Filled 2022-06-05: qty 60, 30d supply, fill #0

## 2022-06-06 ENCOUNTER — Other Ambulatory Visit (HOSPITAL_COMMUNITY): Payer: Self-pay

## 2022-06-07 ENCOUNTER — Other Ambulatory Visit: Payer: Self-pay | Admitting: *Deleted

## 2022-06-07 ENCOUNTER — Encounter: Payer: Self-pay | Admitting: *Deleted

## 2022-06-07 ENCOUNTER — Inpatient Hospital Stay: Payer: Medicaid Other | Attending: Hematology and Oncology | Admitting: Hematology and Oncology

## 2022-06-07 ENCOUNTER — Other Ambulatory Visit (HOSPITAL_COMMUNITY): Payer: Self-pay

## 2022-06-07 DIAGNOSIS — C50211 Malignant neoplasm of upper-inner quadrant of right female breast: Secondary | ICD-10-CM | POA: Diagnosis not present

## 2022-06-07 DIAGNOSIS — Z170 Estrogen receptor positive status [ER+]: Secondary | ICD-10-CM

## 2022-06-07 MED ORDER — GABAPENTIN 300 MG PO CAPS
300.0000 mg | ORAL_CAPSULE | Freq: Two times a day (BID) | ORAL | 3 refills | Status: AC
  Filled 2022-06-07 – 2022-07-11 (×2): qty 180, 90d supply, fill #0
  Filled 2022-09-26: qty 180, 90d supply, fill #1
  Filled 2023-01-22: qty 180, 90d supply, fill #2
  Filled 2023-04-19: qty 180, 90d supply, fill #3

## 2022-06-07 NOTE — Progress Notes (Signed)
Updated fax for Dr. Allyn Kenner [phone], referral successfully faxed.

## 2022-06-07 NOTE — Progress Notes (Signed)
Per MD request, referral placed to Dr. Allyn Kenner Dermatology for further evaluation and tx of abnormal back moles.  Referral successfully faxed to [phone].

## 2022-06-07 NOTE — Progress Notes (Signed)
Received call from Dr. [facility] office stating they do not accept medicaid patients.  RN contacted pt and instructed pt to contact medicaid representative to see who is in network.  Pt educated to contact our office once provider is located and referral can be faxed. Pt verbalized understanding.

## 2022-06-07 NOTE — Assessment & Plan Note (Signed)
Patient palpated a right breast lump x5yrand a left breast lump x223yrDiagnostic mammogram and USKoreahowed in the right breast, a 2.7cm mass 5cm from the nipple and 0.8cm mass 1cm from the nipple at the 12:30 position, with borderline cortical thickening in the right axilla, and in the left breast, a 4.0cm mass at the 11 o'clock position representing a hamartoma. 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  Treatment plan: 1. Neoadjuvant chemotherapy with [place]erjeta 6 cyclescompleted 1/25/2022followed by Herceptin Perjetamaintenance for 1 year completed 08/30/2021 2. Followed by breast conserving surgery if possible with sentinel lymph node study2/08/2021: Complete pathologic response, 0/4lymph nodes negative 3. Followed by adjuvant radiation therapy3/21/22-4/15/22 4.Followed by adjuvant antiestrogen therapywith letrozole started 06/28/2021, changed to anastrozole  ------------------------------------------------------------------------------------------------------------------------------------------- Current treatment:Anastrozole  Echocardiogram 09/06/2020: EF 60 to 65%  Chemo induced peripheral neuropathy: Saw Dr.Vaslow  CT Neck and Chest: 05/20/22: Benign  RTC in 1 year

## 2022-06-09 ENCOUNTER — Other Ambulatory Visit (HOSPITAL_COMMUNITY): Payer: Self-pay

## 2022-06-12 ENCOUNTER — Ambulatory Visit: Payer: Medicaid Other | Admitting: Physician Assistant

## 2022-06-12 ENCOUNTER — Ambulatory Visit: Payer: Medicaid Other | Admitting: Pulmonary Disease

## 2022-06-12 NOTE — Progress Notes (Deleted)
Established Patient Office Visit  Subjective:  Patient ID: Amanda Rich, female    DOB: Jul 07, 1966  Age: 56 y.o. MRN: [id number]  CC: No chief complaint on file.   HPI Amanda Rich presents for ***  Outpatient Medications Prior to Visit  Medication Sig Dispense Refill   acetaminophen (TYLENOL) 325 MG tablet Take 2 tablets (650 mg total) by mouth every 4 (four) hours as needed for headache or moderate pain.     albuterol (PROAIR HFA) 108 (90 Base) MCG/ACT inhaler Inhale 2 puffs into the lungs every 6 (six) hours as needed for wheezing or shortness of breath. 18 g 0   anastrozole (ARIMIDEX) 1 MG tablet Take 1 tablet (1 mg total) by mouth daily. 90 tablet 3   aspirin 81 MG chewable tablet Chew 1 tablet (81 mg total) by mouth daily. 30 tablet 11   baclofen (LIORESAL) 10 MG tablet Take 1 tablet (10 mg total) by mouth 3 (three) times daily as needed for muscle spasms. 60 each 0   budesonide-formoterol (SYMBICORT) 80-4.5 MCG/ACT inhaler Inhale 2 puffs into the lungs in the morning and at bedtime. 10.2 g 12   Calcium-Vitamin D-Vitamin K (VIACTIV PO) Take 1 tablet by mouth daily.     Cholecalciferol 25 MCG (1000 UT) tablet Take 2 tablets (2,000 Units total) by mouth daily. 90 tablet 0   citalopram (CELEXA) 10 MG tablet Take 1 tablet (10 mg total) by mouth daily. 90 tablet 3   gabapentin (NEURONTIN) 300 MG capsule Take 1 capsule (300 mg total) by mouth 2 (two) times daily. 180 capsule 3   loratadine (CLARITIN) 10 MG tablet Take 1 tablet (10 mg total) by mouth daily. 30 tablet 0   magnesium oxide (MAG-OX) 400 (240 Mg) MG tablet Take 1 tablet (400 mg total) by mouth at bedtime. 30 tablet 11   magnesium oxide (MAG-OX) 400 MG tablet Take 1/2 tablet (200 mg total) by mouth at bedtime. 15 tablet 0   predniSONE (DELTASONE) 50 MG tablet Take 1 tablet 13 hrs, 7 hrs, and 1 hr before ct scan. 3 tablet 0   ticagrelor (BRILINTA) 90 MG TABS tablet Take 1 tablet (90 mg total) by mouth 2 (two) times  daily. 60 tablet 0   Tiotropium Bromide Monohydrate (SPIRIVA RESPIMAT) 2.5 MCG/ACT AERS Inhale 2 puffs into the lungs daily. 4 g 6   No facility-administered medications prior to visit.    Allergies  Allergen Reactions   Clarithromycin Swelling    Biaxin   Lyrica [Pregabalin]     Oversedated    Tape     Rash from Tegaderm   Ivp Dye [Iodinated Contrast Media] Rash    Patient Care Team: Marcellina Millin as PCP - General (Physician Assistant) Sueanne Margarita, MD as PCP - Cardiology (Cardiology) [name], MD as Consulting Physician (General Surgery) Nicholas Lose, MD as Consulting Physician (Hematology and Oncology) Gery Pray, MD as Consulting Physician (Radiation Oncology)  ROS Review of Systems    Objective:    Physical Exam  LMP 10/15/2017 (Approximate)   Wt Readings from Last 3 Encounters:  05/10/22 128 lb 9.6 oz (58.3 kg)  04/20/22 133 lb 1.6 oz (60.4 kg)  03/17/22 124 lb 9 oz (56.5 kg)    Results for orders placed or performed during the hospital encounter of 03/14/22  Urine Culture   Specimen: Urine, Clean Catch  Result Value Ref Range   Specimen Description URINE, CLEAN CATCH    Special Requests NONE  Culture (A)     <10,000 COLONIES/mL INSIGNIFICANT GROWTH Performed at [facility] [address], [place],  [address]    Report Status 03/19/2022 FINAL   CBC with Differential/Platelet  Result Value Ref Range   WBC 7.8 4.0 - 10.5 K/uL   RBC 3.69 (L) 3.87 - 5.11 MIL/uL   Hemoglobin 10.7 (L) 12.0 - 15.0 g/dL   HCT 31.9 (L) 36.0 - 46.0 %   MCV 86.4 80.0 - 100.0 fL   MCH 29.0 26.0 - 34.0 pg   MCHC 33.5 30.0 - 36.0 g/dL   RDW 13.6 11.5 - 15.5 %   Platelets 246 150 - 400 K/uL   nRBC 0.0 0.0 - 0.2 %   Neutrophils Relative % 57 %   Neutro Abs 4.4 1.7 - 7.7 K/uL   Lymphocytes Relative 31 %   Lymphs Abs 2.5 0.7 - 4.0 K/uL   Monocytes Relative 11 %   Monocytes Absolute 0.8 0.1 - 1.0 K/uL   Eosinophils Relative 0 %    Eosinophils Absolute 0.0 0.0 - 0.5 K/uL   Basophils Relative 0 %   Basophils Absolute 0.0 0.0 - 0.1 K/uL   Immature Granulocytes 1 %   Abs Immature Granulocytes 0.05 0.00 - 0.07 K/uL  Comprehensive metabolic panel  Result Value Ref Range   Sodium 138 135 - 145 mmol/L   Potassium 3.6 3.5 - 5.1 mmol/L   Chloride 102 98 - 111 mmol/L   CO2 26 22 - 32 mmol/L   Glucose, Bld 86 70 - 99 mg/dL   BUN 11 6 - 20 mg/dL   Creatinine, Ser 0.56 0.44 - 1.00 mg/dL   Calcium 9.0 8.9 - 10.3 mg/dL   Total Protein 5.6 (L) 6.5 - 8.1 g/dL   Albumin 3.1 (L) 3.5 - 5.0 g/dL   AST 22 15 - 41 U/L   ALT 23 0 - 44 U/L   Alkaline Phosphatase 41 38 - 126 U/L   Total Bilirubin 0.5 0.3 - 1.2 mg/dL   GFR, Estimated >60 >60 mL/min   Anion gap 10 5 - 15  Urinalysis, Complete w Microscopic  Result Value Ref Range   Color, Urine YELLOW YELLOW   APPearance CLEAR CLEAR   Specific Gravity, Urine 1.012 1.005 - 1.030   pH 7.0 5.0 - 8.0   Glucose, UA NEGATIVE NEGATIVE mg/dL   Hgb urine dipstick NEGATIVE NEGATIVE   Bilirubin Urine NEGATIVE NEGATIVE   Ketones, ur NEGATIVE NEGATIVE mg/dL   Protein, ur NEGATIVE NEGATIVE mg/dL   Nitrite NEGATIVE NEGATIVE   Leukocytes,Ua LARGE (A) NEGATIVE   RBC / HPF 0-5 0 - 5 RBC/hpf   WBC, UA 21-50 0 - 5 WBC/hpf   Bacteria, UA NONE SEEN NONE SEEN   Squamous Epithelial / LPF 0-5 0 - 5     {Labs (Optional):23779}  The 10-year ASCVD risk score (Arnett DK, et al., 2019) is: 3.1%    Assessment & Plan:   Problem List Items Addressed This Visit   None   No orders of the defined types were placed in this encounter.   Follow-up: No follow-ups on file.    Irene Pap, PA-C

## 2022-06-13 ENCOUNTER — Encounter: Payer: Self-pay | Admitting: Emergency Medicine

## 2022-06-13 ENCOUNTER — Telehealth: Payer: Self-pay | Admitting: Emergency Medicine

## 2022-06-13 DIAGNOSIS — C50211 Malignant neoplasm of upper-inner quadrant of right female breast: Secondary | ICD-10-CM

## 2022-06-13 NOTE — Telephone Encounter (Signed)
See separate research encounter

## 2022-06-13 NOTE — Research (Signed)
[id number] - TREATMENT OF ESTABLISHED CHEMOTHERAPY-INDUCED NEUROPATHY WITH N-PALMITOYLETHANOLAMIDE, A CANNABIMIMETIC NUTRACEUTICAL: A RANDOMIZED DOUBLE-BLIND PHASE II PILOT TRIAL  06/13/22   SURVIVAL FOLLOW-UP PHONE CALL: Patient was contacted via phone today to complete the 12 Months follow-up phone call. Identify was confirmed using two patient identifiers.   NEUROPATHY STATUS: Patient reports her neuropathy symptoms have improved and she is taking gabapentin '300mg'$  twice daily with baclofen as needed for muscle spasm.    CANCER RECURRENCE: Patient denies any recurrent cancer problems.  The patient was thanked for their time and continued voluntary participation in this study. Patient has been provided direct contact information and is encouraged to contact this Coordinator for any needs or questions.  Clabe Seal Clinical Research Coordinator I  06/13/22 3:59 PM

## 2022-06-15 ENCOUNTER — Encounter: Payer: Self-pay | Admitting: Physician Assistant

## 2022-06-19 ENCOUNTER — Ambulatory Visit (INDEPENDENT_AMBULATORY_CARE_PROVIDER_SITE_OTHER): Payer: Medicaid Other | Admitting: Pulmonary Disease

## 2022-06-19 ENCOUNTER — Encounter: Payer: Self-pay | Admitting: Pulmonary Disease

## 2022-06-19 ENCOUNTER — Other Ambulatory Visit (HOSPITAL_COMMUNITY): Payer: Self-pay

## 2022-06-19 VITALS — BP 116/64 | HR 58 | Ht 65.0 in | Wt 126.0 lb

## 2022-06-19 DIAGNOSIS — R0609 Other forms of dyspnea: Secondary | ICD-10-CM | POA: Diagnosis not present

## 2022-06-19 DIAGNOSIS — Z716 Tobacco abuse counseling: Secondary | ICD-10-CM

## 2022-06-19 DIAGNOSIS — F17210 Nicotine dependence, cigarettes, uncomplicated: Secondary | ICD-10-CM

## 2022-06-19 MED ORDER — NICOTINE 21 MG/24HR TD PT24
21.0000 mg | MEDICATED_PATCH | Freq: Every day | TRANSDERMAL | 2 refills | Status: DC
  Filled 2022-06-19: qty 28, 28d supply, fill #0

## 2022-06-20 ENCOUNTER — Other Ambulatory Visit (HOSPITAL_COMMUNITY): Payer: Self-pay

## 2022-06-24 DIAGNOSIS — Z419 Encounter for procedure for purposes other than remedying health state, unspecified: Secondary | ICD-10-CM | POA: Diagnosis not present

## 2022-07-01 ENCOUNTER — Other Ambulatory Visit (HOSPITAL_COMMUNITY): Payer: Self-pay

## 2022-07-04 ENCOUNTER — Other Ambulatory Visit (HOSPITAL_COMMUNITY): Payer: Self-pay

## 2022-07-11 ENCOUNTER — Other Ambulatory Visit: Payer: Self-pay | Admitting: Physical Medicine and Rehabilitation

## 2022-07-11 ENCOUNTER — Other Ambulatory Visit (HOSPITAL_COMMUNITY): Payer: Self-pay

## 2022-07-11 MED ORDER — TICAGRELOR 90 MG PO TABS
90.0000 mg | ORAL_TABLET | Freq: Two times a day (BID) | ORAL | 0 refills | Status: DC
  Filled 2022-07-11: qty 60, 30d supply, fill #0

## 2022-07-12 ENCOUNTER — Other Ambulatory Visit: Payer: Self-pay | Admitting: *Deleted

## 2022-07-12 ENCOUNTER — Other Ambulatory Visit (HOSPITAL_COMMUNITY): Payer: Self-pay

## 2022-07-12 MED ORDER — BACLOFEN 10 MG PO TABS
10.0000 mg | ORAL_TABLET | Freq: Three times a day (TID) | ORAL | 1 refills | Status: DC | PRN
  Filled 2022-07-12: qty 60, 20d supply, fill #0

## 2022-08-07 ENCOUNTER — Other Ambulatory Visit: Payer: Self-pay | Admitting: Family Medicine

## 2022-08-07 ENCOUNTER — Other Ambulatory Visit (HOSPITAL_COMMUNITY): Payer: Self-pay

## 2022-08-07 ENCOUNTER — Other Ambulatory Visit: Payer: Self-pay | Admitting: Physical Medicine and Rehabilitation

## 2022-08-07 MED ORDER — TICAGRELOR 90 MG PO TABS
90.0000 mg | ORAL_TABLET | Freq: Two times a day (BID) | ORAL | 0 refills | Status: DC
Start: 2022-08-07 — End: 2022-09-26
  Filled 2022-08-07: qty 60, 30d supply, fill #0

## 2022-08-07 MED ORDER — LORATADINE 10 MG PO TABS
10.0000 mg | ORAL_TABLET | Freq: Every day | ORAL | 0 refills | Status: DC
  Filled 2022-08-07: qty 30, 30d supply, fill #0

## 2022-08-08 ENCOUNTER — Other Ambulatory Visit (HOSPITAL_COMMUNITY): Payer: Self-pay

## 2022-08-29 ENCOUNTER — Encounter
Payer: Medicaid Other | Attending: Physical Medicine and Rehabilitation | Admitting: Physical Medicine and Rehabilitation

## 2022-08-29 ENCOUNTER — Encounter: Payer: Self-pay | Admitting: Physical Medicine and Rehabilitation

## 2022-08-29 VITALS — BP 96/65 | HR 103 | Ht 65.0 in | Wt 123.6 lb

## 2022-08-29 DIAGNOSIS — R4189 Other symptoms and signs involving cognitive functions and awareness: Secondary | ICD-10-CM | POA: Insufficient documentation

## 2022-08-29 DIAGNOSIS — I639 Cerebral infarction, unspecified: Secondary | ICD-10-CM | POA: Diagnosis present

## 2022-08-29 DIAGNOSIS — T148XXA Other injury of unspecified body region, initial encounter: Secondary | ICD-10-CM | POA: Insufficient documentation

## 2022-08-29 DIAGNOSIS — G4701 Insomnia due to medical condition: Secondary | ICD-10-CM | POA: Insufficient documentation

## 2022-08-29 NOTE — Progress Notes (Addendum)
Subjective:    Patient ID: Amanda Rich, female    DOB: 02/16/1966, 56 y.o.   MRN: [id number]  HPI [name] is a 56 year old woman who presents for hospital follow-up for subcortical infarction.  1) Subcortical infarction -having bruising from the [place] -she was not told how long she should take the [place] for  -her disability was just approved -she used to enjoy work and has been so used to working.  -she follows with [facility] and has arteriogram scheduled October.  -she did not like her OT, she likes PT  2) decrease in appetite -lost weight from 130 lbs to 123 lbs -felt better at a higher weight.    Pain Inventory Average Pain 5 Pain Right Now 0 My pain is dull and aching  LOCATION OF PAIN  Shoulder, Leg, Toes  BOWEL Number of stools per week: 10   BLADDER Normal    Mobility use a cane how many minutes can you walk? 10 ability to climb steps?  yes do you drive?  yes transfers alone Do you have any goals in this area?  yes  Function disabled: date disabled .  Neuro/Psych weakness numbness spasms dizziness confusion depression  Prior Studies Any changes since last visit?  no    Family History  Problem Relation Age of Onset   Diabetes Mother    Hypertension Mother    Liver cancer Daughter 60   Diabetes Maternal Aunt    Breast cancer Maternal Aunt 44   Colon cancer Maternal Uncle 67       or prostate cancer   Colon cancer Cousin    Breast cancer Cousin    Autism Half-Brother    Breast cancer Other 63       maternal second cousin   Diabetes Paternal Uncle    Breast cancer Other        dx. 74s, paternal second cousin   Lung cancer Other        dx. 56s, paternal cousin once removed (father's cousin)   Social History   Socioeconomic History   Marital status: Divorced    Spouse name: Not on file   Number of children: 3   Years of education: Not on file   Highest education level: Associate degree: academic program   Occupational History   Not on file  Tobacco Use   Smoking status: Every Day    Packs/day: 1.00    Years: 25.00    Total pack years: 25.00    Types: Cigarettes   Smokeless tobacco: Never   Tobacco comments:    Using Nicotine Patch 03/06/22  Vaping Use   Vaping Use: Never used  Substance and Sexual Activity   Alcohol use: Not Currently    Comment: rarely   Drug use: No   Sexual activity: Yes    Birth control/protection: Post-menopausal  Other Topics Concern   Not on file  Social History Narrative   Not on file   Social Determinants of Health   Financial Resource Strain: High Risk (09/01/2020)   Overall Financial Resource Strain (CARDIA)    Difficulty of Paying Living Expenses: Very hard  Food Insecurity: Food Insecurity Present (09/01/2020)   Hunger Vital Sign    Worried About Running Out of Food in the Last Year: Sometimes true    Ran Out of Food in the Last Year: Never true  Transportation Needs: No Transportation Needs (09/01/2020)   PRAPARE - Hydrologist (Medical): No  Lack of Transportation (Non-Medical): No  Physical Activity: Not on file  Stress: Not on file  Social Connections: Not on file   Past Surgical History:  Procedure Laterality Date   ABLATION ON ENDOMETRIOSIS     BREAST BIOPSY Right    BREAST LUMPECTOMY WITH RADIOACTIVE SEED AND SENTINEL LYMPH NODE BIOPSY Right 02/02/2021   Procedure: RIGHT BREAST LUMPECTOMY WITH RADIOACTIVE SEED AND RIGHT SENTINEL LYMPH NODE MAPPING;  Surgeon: [name], MD;  Location: [facility];  Service: General;  Laterality: Right;   GANGLION CYST EXCISION  12/25/2005   L wrist; [facility], [place]   IR ANGIO INTRA EXTRACRAN SEL INTERNAL CAROTID BILAT MOD SED  02/06/2022   IR ANGIO INTRA EXTRACRAN SEL INTERNAL CAROTID UNI R MOD SED  03/10/2022   IR ANGIO VERTEBRAL SEL VERTEBRAL BILAT MOD SED  02/06/2022   IR ANGIOGRAM FOLLOW UP STUDY  03/14/2022   IR IMAGING GUIDED PORT INSERTION   09/10/2020   IR NEURO EACH ADD'L AFTER BASIC UNI RIGHT (MS)  03/14/2022   IR TRANSCATH/EMBOLIZ  03/10/2022   RADIOLOGY WITH ANESTHESIA N/A 03/10/2022   Procedure: 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 Gery Pray   Pericarditis    Smoker    Umbilical hernia    Ht '5\' 5"'$  (1.651 m)   Wt 123 lb 9.6 oz (56.1 kg)   LMP 10/15/2017 (Approximate)   BMI 20.57 kg/m   Opioid Risk Score:   Fall Risk Score:  `1  Depression screen [facility] 2/9     08/29/2022   11:20 AM 11/07/2021   11:02 AM 10/06/2021   10:51 AM 10/06/2021   10:49 AM 06/10/2021   11:35 AM 09/01/2020   10:15 AM 03/04/2018    8:14 AM  Depression screen PHQ 2/9  Decreased Interest 2 0 0 0 0 2 0  Down, Depressed, Hopeless 1 2 0 0 0 1 1  PHQ - 2 Score 3 2 0 0 0 3 1  Altered sleeping '3 3 1  '$ 0    Tired, decreased energy '1 3 3  '$ 0    Change in appetite 1 0 0  0    Feeling bad or failure about yourself  0 0 0  0    Trouble concentrating 0 1 0  0    Moving slowly or fidgety/restless 1 0 1  0    Suicidal thoughts 0 0 0  0    PHQ-9 Score '9 9 5  '$ 0    Difficult doing work/chores Very difficult Not difficult at all Not difficult at all  Not difficult at all        Review of Systems  Constitutional:  Positive for appetite change.  Respiratory:  Positive for shortness of breath.   Skin:        Random bruising  All other systems reviewed and are negative.     Objective:   Physical Exam Gen: no distress, normal appearing HEENT: oral  mucosa pink and moist, NCAT Cardio: Reg rate Chest:  normal effort, normal rate of breathing Abd: soft, non-distended Ext: no edema Psych: pleasant, normal affect Skin: intact Neuro: Alert and oriented x3, left side strength 4/5, 5/5 in right MSK: using cane for ambulation       Assessment & Plan:   1) CVA -continue therapies -would benefit from handicap placard to increase mobility in the community -reviewed all medications and provided necessary refills. -continue use of cane -discussed choosing anti-inflammatory foods -recommended decreasing sugar as much as possible.   2) Bruising -discussed that this could be from the [place], recommended following up with surgeon regarding duraiton of Brillinta  3) Brain fog -encouraged continued exercise -encouraged walnuts, blueberries, salmons -can consider Cefazolin NAC  4) Insomnia: -Try to go outside near sunrise -Get exercise during the day.  -Turn off all devices an hour before bedtime.  -Teas that can benefit: chamomile, valerian root, Brahmi (Bacopa) -Can consider over the counter melatonin, magnesium, and/or L-theanine. Melatonin is an anti-oxidant with multiple health benefits. Magnesium is involved in greater than 300 enzymatic reactions in the body and most of [place] are deficient as our soil is often depleted. There are 7 different types of magnesium- Bioptemizer's is a supplement with all 7 types, and each has unique benefits. Magnesium can also help with constipation and anxiety.  -Pistachios naturally increase the production of melatonin -Cozy Earth bamboo bed sheets are free from toxic chemicals.  -Tart cherry juice or a tart cherry supplement can improve sleep and soreness post-workout

## 2022-08-29 NOTE — Patient Instructions (Addendum)
Brain fog: Salmon  Fish oil Blueberries Cefazolin NAC   Insomnia: -Try to go outside near sunrise -Get exercise during the day.  -Turn off all devices an hour before bedtime.  -Teas that can benefit: chamomile, valerian root, Brahmi (Bacopa) -Can consider over the counter melatonin, magnesium, and/or L-theanine. Melatonin is an anti-oxidant with multiple health benefits. Magnesium is involved in greater than 300 enzymatic reactions in the body and most of [place] are deficient as our soil is often depleted. There are 7 different types of magnesium- Bioptemizer's is a supplement with all 7 types, and each has unique benefits. Magnesium can also help with constipation and anxiety.  -Pistachios naturally increase the production of melatonin -Cozy Earth bamboo bed sheets are free from toxic chemicals.  -Tart cherry juice or a tart cherry supplement can improve sleep and soreness post-workout

## 2022-08-30 ENCOUNTER — Encounter: Payer: Self-pay | Admitting: Internal Medicine

## 2022-08-31 ENCOUNTER — Other Ambulatory Visit (HOSPITAL_COMMUNITY): Payer: Self-pay

## 2022-08-31 ENCOUNTER — Other Ambulatory Visit: Payer: Self-pay | Admitting: Hematology and Oncology

## 2022-08-31 MED ORDER — ANASTROZOLE 1 MG PO TABS
1.0000 mg | ORAL_TABLET | Freq: Every day | ORAL | 3 refills | Status: DC
  Filled 2022-08-31 – 2022-11-27 (×2): qty 90, 90d supply, fill #0
  Filled 2023-02-21 – 2023-02-22 (×2): qty 90, 90d supply, fill #1
  Filled 2023-05-22: qty 90, 90d supply, fill #2

## 2022-09-04 ENCOUNTER — Telehealth: Payer: Self-pay | Admitting: Hematology and Oncology

## 2022-09-04 NOTE — Telephone Encounter (Signed)
Scheduled appointment per 9/5 staff message. Left voicemail.

## 2022-09-15 ENCOUNTER — Other Ambulatory Visit: Payer: Self-pay | Admitting: Neurosurgery

## 2022-09-15 DIAGNOSIS — I671 Cerebral aneurysm, nonruptured: Secondary | ICD-10-CM

## 2022-09-18 ENCOUNTER — Other Ambulatory Visit: Payer: Self-pay | Admitting: Neurosurgery

## 2022-09-26 ENCOUNTER — Other Ambulatory Visit (HOSPITAL_COMMUNITY): Payer: Self-pay

## 2022-09-26 ENCOUNTER — Other Ambulatory Visit: Payer: Self-pay | Admitting: Family Medicine

## 2022-09-26 MED ORDER — TICAGRELOR 90 MG PO TABS
90.0000 mg | ORAL_TABLET | Freq: Two times a day (BID) | ORAL | 0 refills | Status: DC
  Filled 2022-09-26: qty 180, 90d supply, fill #0

## 2022-10-03 ENCOUNTER — Encounter: Payer: Self-pay | Admitting: Internal Medicine

## 2022-10-16 ENCOUNTER — Other Ambulatory Visit (HOSPITAL_COMMUNITY): Payer: Self-pay

## 2022-10-17 ENCOUNTER — Inpatient Hospital Stay (HOSPITAL_COMMUNITY): Admission: RE | Admit: 2022-10-17 | Payer: Medicaid Other | Source: Ambulatory Visit

## 2022-10-17 ENCOUNTER — Other Ambulatory Visit (HOSPITAL_COMMUNITY): Payer: Self-pay

## 2022-10-17 MED ORDER — PREDNISONE 50 MG PO TABS
ORAL_TABLET | ORAL | 0 refills | Status: DC
  Filled 2022-10-17: qty 3, 1d supply, fill #0

## 2022-10-17 MED ORDER — DIPHENHYDRAMINE HCL 25 MG PO CAPS
ORAL_CAPSULE | ORAL | 0 refills | Status: DC
  Filled 2022-10-31: qty 2, 1d supply, fill #0

## 2022-10-31 ENCOUNTER — Telehealth: Payer: Self-pay | Admitting: *Deleted

## 2022-10-31 ENCOUNTER — Other Ambulatory Visit (HOSPITAL_COMMUNITY): Payer: Self-pay

## 2022-10-31 NOTE — Telephone Encounter (Signed)
Received call from pt with complaint of painful nodule on right upper ribcage.  Pt states she can feel a pulling sensation when she turns and is requesting in office visit for MD to evaluate.  Pt denies recent injury, trauma, swelling. Appt scheduled, pt verbalized understanding of appt date and time.

## 2022-10-31 NOTE — Progress Notes (Signed)
Patient Care Team: Marcellina Millin as PCP - General (Physician Assistant) Sueanne Margarita, MD as PCP - Cardiology (Cardiology) [name], MD as Consulting Physician (General Surgery) Nicholas Lose, MD as Consulting Physician (Hematology and Oncology) Gery Pray, MD as Consulting Physician (Radiation Oncology)  DIAGNOSIS: No diagnosis found.  SUMMARY OF ONCOLOGIC HISTORY: Oncology History  Malignant neoplasm of 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, estrogen receptor positive ([place])  08/25/2020 Initial Diagnosis   Patient palpated a right breast lump x84yrand a left breast lump x231yrDiagnostic mammogram and [place]owed in the right breast, a 2.7cm mass 5cm from the nipple and 0.8cm mass 1cm from the nipple at the 12:30 position, with borderline cortical thickening in the right axilla, and in the left breast, a 4.0cm mass at the 11 o'clock position representing a hamartoma. 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   09/13/2020 - 01/18/2021 [facility]   [facility]       02/02/2021 Surgery   Right lumpectomy ([facility]): no residual carcinoma, 4 right axillary lymph nodes negative for carcinoma.   02/21/2021 - 08/30/2021 Chemotherapy      Patient is on Antibody Plan: BREAST TRASTUZUMAB + PERTUZUMAB Q21D     03/11/2021 - 04/11/2021 Radiation Therapy   Adjuvant radiation   09/2021 -  Anti-estrogen oral therapy   Anastrozole daily     CHIEF COMPLIANT: Follow-up right breast cancer and nodule upper right rib cage on anastrozole  INTERVAL HISTORY: Amanda Rich a [age].o old with the above-mentioned right breast cancer currently on anastrozole. She presents to the clinic for a follow-up to discuss nodule on upper right rib cage..    ALLERGIES:  is allergic to clarithromycin, lyrica  [pregabalin], tape, and ivp dye [iodinated contrast media].  MEDICATIONS:  Current Outpatient Medications  Medication Sig Dispense Refill   acetaminophen (TYLENOL) 325 MG tablet Take 2 tablets (650 mg total) by mouth every 4 (four) hours as needed for headache or moderate pain.     albuterol (PROAIR HFA) 108 (90 Base) MCG/ACT inhaler Inhale 2 puffs into the lungs every 6 (six) hours as needed for wheezing or shortness of breath. 18 g 0   anastrozole (ARIMIDEX) 1 MG tablet Take 1 tablet (1 mg total) by mouth daily. 90 tablet 3   aspirin 81 MG chewable tablet Chew 1 tablet (81 mg total) by mouth daily. 30 tablet 11   baclofen (LIORESAL) 10 MG tablet Take 1 tablet (10 mg total) by mouth 3 (three) times daily as needed for muscle spasms. 60 each 1   budesonide-formoterol (SYMBICORT) 80-4.5 MCG/ACT inhaler Inhale 2 puffs into the lungs in the morning and at bedtime. 10.2 g 12   Calcium-Vitamin D-Vitamin K (VIACTIV PO) Take 1 tablet by mouth daily.     Cholecalciferol 25 MCG (1000 UT) tablet Take 2 tablets (2,000 Units total) by mouth daily. 90 tablet 0   citalopram (CELEXA) 10 MG tablet Take 1 tablet (10 mg total) by mouth daily. 90 tablet 3   diphenhydrAMINE (BENADRYL) 25 mg capsule take 2 capsule by mouth 1 hour prior to scheduled procedure 2 capsule 0   gabapentin (NEURONTIN) 300 MG capsule Take 1 capsule (300 mg total) by mouth 2 (two) times daily. 180 capsule 3   loratadine (CLARITIN) 10 MG tablet Take 1 tablet (10  mg total) by mouth daily. 30 tablet 0   magnesium oxide (MAG-OX) 400 (240 Mg) MG tablet Take 1 tablet (400 mg total) by mouth at bedtime. 30 tablet 11   magnesium oxide (MAG-OX) 400 MG tablet Take 1/2 tablet (200 mg total) by mouth at bedtime. 15 tablet 0   nicotine (NICODERM CQ - DOSED IN MG/24 HOURS) 21 mg/24hr patch Place 1 patch (21 mg total) onto the skin daily. 28 patch 2   predniSONE (DELTASONE) 50 MG tablet Take 1 tablet by mouth 13 hours, 7 hours , and 1 hour prior to  scheduled procedure. 3 tablet 0   ticagrelor (BRILINTA) 90 MG TABS tablet Take 1 tablet (90 mg total) by mouth 2 (two) times daily. 180 tablet 0   Tiotropium Bromide Monohydrate (SPIRIVA RESPIMAT) 2.5 MCG/ACT AERS Inhale 2 puffs into the lungs daily. 4 g 6   No current facility-administered medications for this visit.    PHYSICAL EXAMINATION: ECOG PERFORMANCE STATUS: {CHL ONC ECOG PS:[id number]}  There were no vitals filed for this visit. There were no vitals filed for this visit.  BREAST:*** No palpable masses or nodules in either right or left breasts. No palpable axillary supraclavicular or infraclavicular adenopathy no breast tenderness or nipple discharge. (exam performed in the presence of a chaperone)  LABORATORY DATA:  I have reviewed the data as listed    Latest Ref Rng & Units 03/15/2022    5:30 AM 03/07/2022    1:31 PM 02/06/2022    7:28 AM  CMP  Glucose 70 - 99 mg/dL 86  87  161   BUN 6 - 20 mg/dL _0 Creatinine 0.44 - 1.00 mg/dL 0.56  0.62  0.74   Sodium 135 - 145 mmol/L 138  139  137   Potassium 3.5 - 5.1 mmol/L 3.6  3.8  4.2   Chloride 98 - 111 mmol/L 102  106  104   CO2 22 - 32 mmol/L _1 Calcium 8.9 - 10.3 mg/dL 9.0  9.5  9.6   Total Protein 6.5 - 8.1 g/dL 5.6     Total Bilirubin 0.3 - 1.2 mg/dL 0.5     Alkaline Phos 38 - 126 U/L 41     AST 15 - 41 U/L 22     ALT 0 - 44 U/L 23       Lab Results  Component Value Date   WBC 7.8 03/15/2022   HGB 10.7 (L) 03/15/2022   HCT 31.9 (L) 03/15/2022   MCV 86.4 03/15/2022   PLT 246 03/15/2022   NEUTROABS 4.4 03/15/2022    ASSESSMENT & PLAN:  No problem-specific Assessment & Plan notes found for this encounter.    No orders of the defined types were placed in this encounter.  The patient has a good understanding of the overall plan. she agrees with it. she will call with any problems that may develop before the next visit here. Total time spent: 30 mins including face to face time and time  spent for planning, charting and co-ordination of care   Suzzette Righter, [place] 10/31/22     I Gardiner Coins am scribing for Dr. Lindi Adie  ***

## 2022-11-01 ENCOUNTER — Inpatient Hospital Stay: Payer: Medicaid Other | Attending: Hematology and Oncology | Admitting: Hematology and Oncology

## 2022-11-01 ENCOUNTER — Other Ambulatory Visit: Payer: Self-pay

## 2022-11-01 VITALS — BP 111/75 | HR 65 | Temp 97.3°F | Resp 18 | Ht 65.0 in | Wt 121.6 lb

## 2022-11-01 DIAGNOSIS — C50211 Malignant neoplasm of upper-inner quadrant of right female breast: Secondary | ICD-10-CM | POA: Diagnosis present

## 2022-11-01 DIAGNOSIS — Z79811 Long term (current) use of aromatase inhibitors: Secondary | ICD-10-CM | POA: Insufficient documentation

## 2022-11-01 DIAGNOSIS — Z170 Estrogen receptor positive status [ER+]: Secondary | ICD-10-CM | POA: Insufficient documentation

## 2022-11-01 NOTE — Assessment & Plan Note (Signed)
Patient palpated a right breast lump x41yrand a left breast lump x275yrDiagnostic mammogram and [place] in the right breast, a 2.7cm mass 5cm from the nipple and 0.8cm mass 1cm from the nipple at the 12:30 position, with borderline cortical thickening in the right axilla, and in the left breast, a 4.0cm mass at the 11 o'clock position representing a hamartoma. 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   Treatment plan: 1. Neoadjuvant chemotherapy with T[facility] Perjeta 6 cycles completed 01/18/2021 followed by Herceptin Perjeta maintenance for 1 year completed 08/30/2021 2. Followed by breast conserving surgery if possible with sentinel lymph node study 02/02/2021: Complete pathologic response, 0/4 lymph nodes negative 3. Followed by adjuvant radiation therapy 03/14/21- 04/08/21 4.  Followed by adjuvant antiestrogen therapy with letrozole started 06/28/2021, changed to anastrozole  ------------------------------------------------------------------------------------------------------------------------------------------- Current treatment: Anastrozole   CT Neck  and Chest: 05/20/22: Benign Anastrozole Toxicities: Hot flashes (better if she drinks less coffee)   Mammograms:03/02/22: Benign Pain in the right upper rib cage: Recommended that it be taken CT chest Follow-up after the scan with a telephone visit

## 2022-11-02 ENCOUNTER — Other Ambulatory Visit: Payer: Self-pay | Admitting: Neurosurgery

## 2022-11-02 ENCOUNTER — Other Ambulatory Visit (HOSPITAL_COMMUNITY): Payer: Self-pay

## 2022-11-02 ENCOUNTER — Ambulatory Visit (HOSPITAL_COMMUNITY)
Admission: RE | Admit: 2022-11-02 | Discharge: 2022-11-02 | Disposition: A | Discharge status: Home / Self Care | Payer: Medicaid Other | Source: Ambulatory Visit | Attending: Neurosurgery | Admitting: Neurosurgery

## 2022-11-02 DIAGNOSIS — Z7982 Long term (current) use of aspirin: Secondary | ICD-10-CM | POA: Insufficient documentation

## 2022-11-02 DIAGNOSIS — Z7902 Long term (current) use of antithrombotics/antiplatelets: Secondary | ICD-10-CM | POA: Diagnosis not present

## 2022-11-02 DIAGNOSIS — Z853 Personal history of malignant neoplasm of breast: Secondary | ICD-10-CM | POA: Diagnosis not present

## 2022-11-02 DIAGNOSIS — I671 Cerebral aneurysm, nonruptured: Secondary | ICD-10-CM | POA: Diagnosis present

## 2022-11-02 HISTORY — PX: IR ANGIO INTRA EXTRACRAN SEL INTERNAL CAROTID UNI R MOD SED: IMG5362

## 2022-11-02 MED ORDER — NITROGLYCERIN 1 MG/10 ML FOR IR/CATH LAB
INTRA_ARTERIAL | Status: AC
  Filled 2022-11-02: qty 10

## 2022-11-02 MED ORDER — FENTANYL CITRATE (PF) 100 MCG/2ML IJ SOLN
INTRAMUSCULAR | Status: AC
  Filled 2022-11-02: qty 2

## 2022-11-02 MED ORDER — HEPARIN SODIUM (PORCINE) 1000 UNIT/ML IJ SOLN
INTRAMUSCULAR | Status: AC
  Filled 2022-11-02: qty 10

## 2022-11-02 MED ORDER — VERAPAMIL HCL 2.5 MG/ML IV SOLN
INTRAVENOUS | Status: AC
  Filled 2022-11-02: qty 2

## 2022-11-02 MED ORDER — HEPARIN SODIUM (PORCINE) 1000 UNIT/ML IJ SOLN
INTRAMUSCULAR | Status: AC | PRN
  Administered 2022-11-02: 2000 [IU] via INTRAVENOUS

## 2022-11-02 MED ORDER — LIDOCAINE HCL 1 % IJ SOLN
INTRAMUSCULAR | Status: AC
  Filled 2022-11-02: qty 20

## 2022-11-02 MED ORDER — HYDROCODONE-ACETAMINOPHEN 5-325 MG PO TABS: 1.0000 | ORAL_TABLET | ORAL | Status: DC | PRN

## 2022-11-02 MED ORDER — SODIUM CHLORIDE 0.9 % IV SOLN: INTRAVENOUS | Status: DC

## 2022-11-02 MED ORDER — FENTANYL CITRATE (PF) 100 MCG/2ML IJ SOLN
INTRAMUSCULAR | Status: AC | PRN
  Administered 2022-11-02: 25 ug via INTRAVENOUS

## 2022-11-02 MED ORDER — IOHEXOL 300 MG/ML  SOLN
100.0000 mL | Freq: Once | INTRAMUSCULAR | Status: AC | PRN
  Administered 2022-11-02: 12 mL via INTRA_ARTERIAL

## 2022-11-02 MED ORDER — MIDAZOLAM HCL 2 MG/2ML IJ SOLN
INTRAMUSCULAR | Status: AC
  Filled 2022-11-02: qty 2

## 2022-11-02 NOTE — H&P (Signed)
Chief Complaint   Aneurysm f/u  History of Present Illness  Amanda Rich is a 56 y.o. female with a history of breast cancer who underwent staging MRI of the brain demonstrating incidental posterior communicating artery aneurysm.  Patient underwent pipeline embolization of the aneurysm approximately 6 months ago and has been maintained on daily aspirin and twice daily Brilinta.  She remains neurologically well and presents today for routine short-term angiographic follow-up.  Past Medical History   Past Medical History:  Diagnosis Date   Anemia    Asthma    Cancer ([place])    breast cancer   Depression    Dyspnea    Family history of breast cancer    Family history of liver cancer    History of radiation therapy 03/10/21-04/11/21   Right breast- Dr. Gery Pray   Pericarditis    Smoker    Umbilical hernia     Past Surgical History   Past Surgical History:  Procedure Laterality Date   ABLATION ON ENDOMETRIOSIS     BREAST BIOPSY Right    BREAST LUMPECTOMY WITH RADIOACTIVE SEED AND SENTINEL LYMPH NODE BIOPSY Right 02/02/2021   Procedure: RIGHT BREAST LUMPECTOMY WITH RADIOACTIVE SEED AND RIGHT SENTINEL LYMPH NODE MAPPING;  Surgeon: [name], MD;  Location: [facility];  Service: General;  Laterality: Right;   GANGLION CYST EXCISION  12/25/2005   L wrist; [facility], [place]   IR ANGIO INTRA EXTRACRAN SEL INTERNAL CAROTID BILAT MOD SED  02/06/2022   IR ANGIO INTRA EXTRACRAN SEL INTERNAL CAROTID UNI R MOD SED  03/10/2022   IR ANGIO VERTEBRAL SEL VERTEBRAL BILAT MOD SED  02/06/2022   IR ANGIOGRAM FOLLOW UP STUDY  03/14/2022   IR IMAGING GUIDED PORT INSERTION  09/10/2020   IR NEURO EACH ADD'L AFTER BASIC UNI RIGHT (MS)  03/14/2022   IR TRANSCATH/EMBOLIZ  03/10/2022   RADIOLOGY WITH ANESTHESIA N/A 03/10/2022   Procedure: Pipeline Embolization of Right Internal Carotid Artery Aneurysm;  Surgeon: Consuella Lose, MD;  Location: [facility];  Service: Radiology;   Laterality: N/A;   TUBAL LIGATION     [facility]   UMBILICAL HERNIA REPAIR  12/26/2003   [facility]    Social History   Social History   Tobacco Use   Smoking status: Every Day    Packs/day: 1.00    Years: 25.00    Total pack years: 25.00    Types: Cigarettes   Smokeless tobacco: Never   Tobacco comments:    Using Nicotine Patch 03/06/22  Vaping Use   Vaping Use: Never used  Substance Use Topics   Alcohol use: Not Currently    Comment: rarely   Drug use: No    Medications   Prior to Admission medications   Medication Sig Start Date End Date Taking? Authorizing Provider  albuterol (PROAIR HFA) 108 (90 Base) MCG/ACT inhaler Inhale 2 puffs into the lungs every 6 (six) hours as needed for wheezing or shortness of breath. 03/24/22  Yes Angiulli, Lavon Paganini, PA-C  anastrozole (ARIMIDEX) 1 MG tablet Take 1 tablet (1 mg total) by mouth daily. 08/31/22  Yes Nicholas Lose, MD  aspirin 81 MG chewable tablet Chew 1 tablet (81 mg total) by mouth daily. 04/10/22  Yes Raulkar, Clide Deutscher, MD  budesonide-formoterol (SYMBICORT) 80-4.5 MCG/ACT inhaler Inhale 2 puffs into the lungs in the morning and at bedtime. 03/24/22  Yes Angiulli, Lavon Paganini, PA-C  Calcium-Vitamin D-Vitamin K (VIACTIV PO) Take 1 tablet by mouth daily.   Yes [provider]  Cholecalciferol 25 MCG (1000 UT) tablet Take 2 tablets (2,000 Units total) by mouth daily. 04/10/22  Yes Raulkar, Clide Deutscher, MD  citalopram (CELEXA) 10 MG tablet Take 1 tablet (10 mg total) by mouth daily. 03/24/22  Yes Angiulli, Lavon Paganini, PA-C  diphenhydrAMINE (BENADRYL) 25 mg capsule take 2 capsule by mouth 1 hour prior to scheduled procedure 10/16/22  Yes   gabapentin (NEURONTIN) 300 MG capsule Take 1 capsule (300 mg total) by mouth 2 (two) times daily. 06/07/22  Yes Nicholas Lose, MD  loratadine (CLARITIN) 10 MG tablet Take 1 tablet (10 mg total) by mouth daily. 08/07/22  Yes Raulkar, Clide Deutscher, MD  magnesium oxide (MAG-OX) 400 (240 Mg) MG tablet Take 1 tablet (400 mg  total) by mouth at bedtime. 04/10/22  Yes Raulkar, Clide Deutscher, MD  magnesium oxide (MAG-OX) 400 MG tablet Take 1/2 tablet (200 mg total) by mouth at bedtime. 03/24/22  Yes Angiulli, Lavon Paganini, PA-C  predniSONE (DELTASONE) 50 MG tablet Take 1 tablet by mouth 13 hours, 7 hours , and 1 hour prior to scheduled procedure. 10/16/22  Yes   ticagrelor (BRILINTA) 90 MG TABS tablet Take 1 tablet (90 mg total) by mouth 2 (two) times daily. 09/26/22  Yes Denita Lung, MD  Tiotropium Bromide Monohydrate (SPIRIVA RESPIMAT) 2.5 MCG/ACT AERS Inhale 2 puffs into the lungs daily. 03/24/22  Yes Angiulli, Lavon Paganini, PA-C  acetaminophen (TYLENOL) 325 MG tablet Take 2 tablets (650 mg total) by mouth every 4 (four) hours as needed for headache or moderate pain. 03/24/22   Angiulli, Lavon Paganini, PA-C  baclofen (LIORESAL) 10 MG tablet Take 1 tablet (10 mg total) by mouth 3 (three) times daily as needed for muscle spasms. 07/12/22   Raulkar, Clide Deutscher, MD  nicotine (NICODERM CQ - DOSED IN MG/24 HOURS) 21 mg/24hr patch Place 1 patch (21 mg total) onto the skin daily. 06/19/22   Hunsucker, Bonna Gains, MD  prochlorperazine (COMPAZINE) 10 MG tablet Take 1 tablet (10 mg total) by mouth every 6 (six) hours as needed (Nausea or vomiting). 09/01/20 01/31/21  Nicholas Lose, MD    Allergies   Allergies  Allergen Reactions   Clarithromycin Swelling    Biaxin   Lyrica [Pregabalin]     Oversedated    Tape     Rash from Tegaderm   Ivp Dye [Iodinated Contrast Media] Rash    Review of Systems  ROS  Neurologic Exam  Awake, alert, oriented Memory and concentration grossly intact Speech fluent, appropriate CN grossly intact Motor exam: Upper Extremities Deltoid Bicep Tricep Grip  Right 5/5 5/5 5/5 5/5  Left 5/5 5/5 5/5 5/5   Lower Extremities IP Quad PF DF EHL  Right 5/5 5/5 5/5 5/5 5/5  Left 5/5 5/5 5/5 5/5 5/5   Sensation grossly intact to LT   Impression  - 55 y.o. female 6 months status post elective pipeline embolization of  the right posterior communicating artery aneurysm  Plan  -We will plan on proceeding with routine short-term diagnostic cerebral angiogram  I have reviewed the indications for the angiogram procedure as well as the details of the procedure and the expected postoperative course and recovery. We have also reviewed in detail the risks, benefits, and alternatives to the procedure. All questions were answered and Arlyn Dunning provided informed consent to proceed.  Consuella Lose, MD [facility]

## 2022-11-02 NOTE — Brief Op Note (Signed)
  NEUROSURGERY BRIEF OPERATIVE  NOTE   PREOP DX: Aneurysm  POSTOP DX: Same  PROCEDURE: Diagnostic cerebral angiogram  SURGEON: Dr. Consuella Lose, MD  ANESTHESIA: IV Sedation with Local  EBL: Minimal  SPECIMENS: None  COMPLICATIONS: None  CONDITION: Stable to recovery  FINDINGS (Full report in [facility]): 1. 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   NConsuella Lose MD [facility]

## 2022-11-02 NOTE — Sedation Documentation (Signed)
Right femoral sheath removed, 19f exoseal closure device deployed to right femoral artery by IR tech. Distal pulses palpable.

## 2022-11-02 NOTE — Sedation Documentation (Signed)
Patient tolerated procedure without any discomfort. Right femoral dressing clean, dry, intact. Distal pulses palpable. Patient denies any itching, hives, or difficulty breathing. Report given to Short Stay, RN. Patient transported to room 7 by RN.

## 2022-11-07 ENCOUNTER — Encounter: Payer: Self-pay | Admitting: Internal Medicine

## 2022-11-07 LAB — POCT I-STAT, CHEM 8
BUN: 15 mg/dL (ref 6–20)
Calcium, Ion: 1.2 mmol/L (ref 1.15–1.40)
Chloride: 106 mmol/L (ref 98–111)
Creatinine, Ser: 0.5 mg/dL (ref 0.44–1.00)
Glucose, Bld: 168 mg/dL — ABNORMAL HIGH (ref 70–99)
HCT: 39 % (ref 36.0–46.0)
Hemoglobin: 13.3 g/dL (ref 12.0–15.0)
Potassium: 3.6 mmol/L (ref 3.5–5.1)
Sodium: 142 mmol/L (ref 135–145)
TCO2: 22 mmol/L (ref 22–32)

## 2022-11-13 ENCOUNTER — Other Ambulatory Visit (HOSPITAL_COMMUNITY): Payer: Self-pay

## 2022-11-27 ENCOUNTER — Other Ambulatory Visit: Payer: Self-pay | Admitting: Physical Medicine and Rehabilitation

## 2022-11-27 ENCOUNTER — Other Ambulatory Visit (HOSPITAL_COMMUNITY): Payer: Self-pay

## 2022-11-27 MED ORDER — LORATADINE 10 MG PO TABS
10.0000 mg | ORAL_TABLET | Freq: Every day | ORAL | 0 refills | Status: DC
  Filled 2022-11-27: qty 30, 30d supply, fill #0

## 2022-11-28 ENCOUNTER — Other Ambulatory Visit: Payer: Self-pay | Admitting: Pulmonary Disease

## 2022-11-28 ENCOUNTER — Ambulatory Visit: Payer: Medicaid Other | Admitting: Physical Medicine and Rehabilitation

## 2022-11-28 ENCOUNTER — Other Ambulatory Visit (HOSPITAL_COMMUNITY): Payer: Self-pay

## 2022-11-28 MED ORDER — SPIRIVA RESPIMAT 2.5 MCG/ACT IN AERS
2.0000 | INHALATION_SPRAY | Freq: Every day | RESPIRATORY_TRACT | 6 refills | Status: DC
  Filled 2022-11-28: qty 4, 30d supply, fill #0
  Filled 2022-12-22: qty 4, 30d supply, fill #1
  Filled 2023-02-17 – 2023-02-22 (×3): qty 4, 30d supply, fill #2
  Filled 2023-03-17: qty 4, 30d supply, fill #3
  Filled 2023-04-19 (×2): qty 4, 30d supply, fill #4
  Filled 2023-05-22: qty 4, 30d supply, fill #5
  Filled 2023-06-12: qty 4, 30d supply, fill #6

## 2022-12-04 ENCOUNTER — Other Ambulatory Visit (HOSPITAL_COMMUNITY): Payer: Self-pay

## 2022-12-05 ENCOUNTER — Encounter: Payer: Self-pay | Admitting: Physical Medicine and Rehabilitation

## 2022-12-05 ENCOUNTER — Encounter
Payer: Medicaid Other | Attending: Physical Medicine and Rehabilitation | Admitting: Physical Medicine and Rehabilitation

## 2022-12-05 VITALS — BP 111/75 | HR 82 | Ht 65.0 in | Wt 126.0 lb

## 2022-12-05 DIAGNOSIS — G620 Drug-induced polyneuropathy: Secondary | ICD-10-CM

## 2022-12-05 DIAGNOSIS — R4189 Other symptoms and signs involving cognitive functions and awareness: Secondary | ICD-10-CM | POA: Diagnosis present

## 2022-12-05 DIAGNOSIS — I639 Cerebral infarction, unspecified: Secondary | ICD-10-CM

## 2022-12-05 DIAGNOSIS — F32A Depression, unspecified: Secondary | ICD-10-CM

## 2022-12-05 DIAGNOSIS — T451X5A Adverse effect of antineoplastic and immunosuppressive drugs, initial encounter: Secondary | ICD-10-CM | POA: Diagnosis present

## 2022-12-05 NOTE — Patient Instructions (Signed)

## 2022-12-05 NOTE — Progress Notes (Signed)
Subjective:    Patient ID: Amanda Rich, female    DOB: Oct 10, 1966, 56 y.o.   MRN: [id number]  HPI [name] is a 56 year old woman who presents for follow-up for subcortical infarction.  1) Subcortical infarction -having bruising from the [place] -she was not told how long she should take the [place] for  -her disability was just approved -she used to enjoy work and has been so used to working.  -she follows with [facility] and has arteriogram scheduled October.  -she did not like her OT, she likes PT  2) decrease in appetite -lost weight from 130 lbs to 123 lbs -felt better at a higher weight.   3) depression: -restarted celexa -has started volunteering, she has been working at [employer] -she had completely stopped celexa instead of weaning -she mises her work but does not feel she can stand that long  4) neuropathy -she takes gabapentin twice per day   Pain Inventory Average Pain 4 Pain Right Now 0 My pain is sharp and burning  LOCATION OF PAIN  hand, Leg, Toes  BOWEL Number of stools per week: 7 plus   BLADDER Normal    Mobility use a cane how many minutes can you walk? 10 ability to climb steps?  yes do you drive?  yes Do you have any goals in this area?  yes  Function disabled: date disabled . 07/2020  Neuro/Psych weakness numbness spasms dizziness confusion depression  Prior Studies Any changes since last visit?  yes 10/2022    Family History  Problem Relation Age of Onset   Diabetes Mother    Hypertension Mother    Liver cancer Daughter 53   Diabetes Maternal Aunt    Breast cancer Maternal Aunt 24   Colon cancer Maternal Uncle 68       or prostate cancer   Colon cancer Cousin    Breast cancer Cousin    Autism Half-Brother    Breast cancer Other 22       maternal second cousin   Diabetes Paternal Uncle    Breast cancer Other        dx. 48s, paternal second cousin   Lung cancer Other        dx. 59s, paternal cousin once  removed (father's cousin)   Social History   Socioeconomic History   Marital status: Divorced    Spouse name: Not on file   Number of children: 3   Years of education: Not on file   Highest education level: Associate degree: academic program  Occupational History   Not on file  Tobacco Use   Smoking status: Every Day    Packs/day: 1.00    Years: 25.00    Total pack years: 25.00    Types: Cigarettes   Smokeless tobacco: Never   Tobacco comments:    Using Nicotine Patch 03/06/22  Vaping Use   Vaping Use: Never used  Substance and Sexual Activity   Alcohol use: Not Currently    Comment: rarely   Drug use: No   Sexual activity: Yes    Birth control/protection: Post-menopausal  Other Topics Concern   Not on file  Social History Narrative   Not on file   Social Determinants of Health   Financial Resource Strain: High Risk (09/01/2020)   Overall Financial Resource Strain (CARDIA)    Difficulty of Paying Living Expenses: Very hard  Food Insecurity: Food Insecurity Present (09/01/2020)   Hunger Vital Sign    Worried About Running  Out of Food in the Last Year: Sometimes true    Ran Out of Food in the Last Year: Never true  Transportation Needs: No Transportation Needs (09/01/2020)   PRAPARE - Hydrologist (Medical): No    Lack of Transportation (Non-Medical): No  Physical Activity: Not on file  Stress: Not on file  Social Connections: Not on file   Past Surgical History:  Procedure Laterality Date   ABLATION ON ENDOMETRIOSIS     BREAST BIOPSY Right    BREAST LUMPECTOMY WITH RADIOACTIVE SEED AND SENTINEL LYMPH NODE BIOPSY Right 02/02/2021   Procedure: RIGHT BREAST LUMPECTOMY WITH RADIOACTIVE SEED AND RIGHT SENTINEL LYMPH NODE MAPPING;  Surgeon: [name], MD;  Location: [facility];  Service: General;  Laterality: Right;   GANGLION CYST EXCISION  12/25/2005   L wrist; [facility], [place]   IR ANGIO INTRA EXTRACRAN SEL INTERNAL CAROTID  BILAT MOD SED  02/06/2022   IR ANGIO INTRA EXTRACRAN SEL INTERNAL CAROTID UNI R MOD SED  03/10/2022   IR ANGIO INTRA EXTRACRAN SEL INTERNAL CAROTID UNI R MOD SED  11/02/2022   IR ANGIO VERTEBRAL SEL VERTEBRAL BILAT MOD SED  02/06/2022   IR ANGIOGRAM FOLLOW UP STUDY  03/14/2022   IR IMAGING GUIDED PORT INSERTION  09/10/2020   IR NEURO EACH ADD'L AFTER BASIC UNI RIGHT (MS)  03/14/2022   IR TRANSCATH/EMBOLIZ  03/10/2022   RADIOLOGY WITH ANESTHESIA N/A 03/10/2022   Procedure: Pipeline Embolization of Right Internal Carotid Artery Aneurysm;  Surgeon: Consuella Lose, MD;  Location: [facility];  Service: Radiology;  Laterality: N/A;   TUBAL LIGATION     [facility]   UMBILICAL HERNIA REPAIR  12/26/2003   [place]   Past Medical History:  Diagnosis Date   Anemia    Asthma    Cancer ([place])    breast cancer   Depression    Dyspnea    Family history of breast cancer    Family history of liver cancer    History of radiation therapy 03/10/21-04/11/21   Right breast- Dr. Gery Pray   Pericarditis    Smoker    Umbilical hernia    LMP 62/83/1517 (Approximate)   Opioid Risk Score:   Fall Risk Score:  `1  Depression screen [facility] 2/9     08/29/2022   11:20 AM 11/07/2021   11:02 AM 10/06/2021   10:51 AM 10/06/2021   10:49 AM 06/10/2021   11:35 AM 09/01/2020   10:15 AM 03/04/2018    8:14 AM  Depression screen PHQ 2/9  Decreased Interest 2 0 0 0 0 2 0  Down, Depressed, Hopeless 1 2 0 0 0 1 1  PHQ - 2 Score 3 2 0 0 0 3 1  Altered sleeping '3 3 1  '$ 0    Tired, decreased energy '1 3 3  '$ 0    Change in appetite 1 0 0  0    Feeling bad or failure about yourself  0 0 0  0    Trouble concentrating 0 1 0  0    Moving slowly or fidgety/restless 1 0 1  0    Suicidal thoughts 0 0 0  0    PHQ-9 Score '9 9 5  '$ 0    Difficult doing work/chores Very difficult Not difficult at all Not difficult at all  Not difficult at all        Review of Systems  Constitutional:  Positive for appetite change.  Respiratory:  Positive for  shortness of breath.   Skin:        Random bruising  All other systems reviewed and are negative.     Objective:   Physical Exam Gen: no distress, normal appearing, weight 126 lbs, BMI 20.97 HEENT: oral mucosa pink and moist, NCAT Cardio: Reg rate Chest: normal effort, normal rate of breathing Abd: soft, non-distended Ext: no edema Psych: pleasant, normal affect Skin: intact Neuro: Alert and oriented x3, left side strength 4/5, 5/5 in right MSK: using cane for ambulation       Assessment & Plan:   1) CVA -continue HEP -would benefit from handicap placard to increase mobility in the community -reviewed all medications and provided necessary refills. -continue use of cane -discussed choosing anti-inflammatory foods -recommended decreasing sugar as much as possible.   2) Bruising -discussed that this could be from the [place], recommended following up with surgeon regarding duraiton of Brillinta  3) Brain fog -encouraged continued exercise -encouraged walnuts, blueberries, salmons -can consider Cefazolin NAC -discussed that more noticeable when she is tired  4) Insomnia: -made goal to stop using computer after 12pm -Try to go outside near sunrise -Get exercise during the day.  -Turn off all devices an hour before bedtime.  -Teas that can benefit: chamomile, valerian root, Brahmi (Bacopa) -Can consider over the counter melatonin, magnesium, and/or L-theanine. Melatonin is an anti-oxidant with multiple health benefits. Magnesium is involved in greater than 300 enzymatic reactions in the body and most of [place] are deficient as our soil is often depleted. There are 7 different types of magnesium- Bioptemizer's is a supplement with all 7 types, and each has unique benefits. Magnesium can also help with constipation and anxiety.  -Pistachios naturally increase the production of melatonin -Cozy Earth bamboo bed sheets are free from toxic chemicals.  -Tart cherry juice or a tart  cherry supplement can improve sleep and soreness post-workout

## 2022-12-22 ENCOUNTER — Other Ambulatory Visit: Payer: Self-pay | Admitting: Physical Medicine and Rehabilitation

## 2022-12-22 ENCOUNTER — Other Ambulatory Visit: Payer: Self-pay

## 2022-12-22 ENCOUNTER — Other Ambulatory Visit (HOSPITAL_COMMUNITY): Payer: Self-pay

## 2022-12-23 ENCOUNTER — Other Ambulatory Visit (HOSPITAL_COMMUNITY): Payer: Self-pay

## 2022-12-28 ENCOUNTER — Other Ambulatory Visit (HOSPITAL_COMMUNITY): Payer: Self-pay

## 2022-12-28 MED ORDER — LORATADINE 10 MG PO TABS
10.0000 mg | ORAL_TABLET | Freq: Every day | ORAL | 0 refills | Status: DC
  Filled 2022-12-28 – 2023-01-22 (×2): qty 30, 30d supply, fill #0

## 2023-01-01 ENCOUNTER — Other Ambulatory Visit (HOSPITAL_COMMUNITY): Payer: Self-pay

## 2023-01-22 ENCOUNTER — Other Ambulatory Visit: Payer: Self-pay

## 2023-01-22 ENCOUNTER — Other Ambulatory Visit (HOSPITAL_COMMUNITY): Payer: Self-pay

## 2023-01-22 ENCOUNTER — Encounter: Payer: Self-pay | Admitting: Hematology and Oncology

## 2023-01-22 ENCOUNTER — Other Ambulatory Visit: Payer: Self-pay | Admitting: *Deleted

## 2023-01-22 DIAGNOSIS — Z170 Estrogen receptor positive status [ER+]: Secondary | ICD-10-CM

## 2023-01-23 ENCOUNTER — Other Ambulatory Visit: Payer: Self-pay

## 2023-01-31 ENCOUNTER — Other Ambulatory Visit (INDEPENDENT_AMBULATORY_CARE_PROVIDER_SITE_OTHER): Payer: Medicare Other

## 2023-01-31 DIAGNOSIS — Z23 Encounter for immunization: Secondary | ICD-10-CM

## 2023-02-17 ENCOUNTER — Other Ambulatory Visit (HOSPITAL_COMMUNITY): Payer: Self-pay

## 2023-02-21 ENCOUNTER — Other Ambulatory Visit (HOSPITAL_COMMUNITY): Payer: Self-pay

## 2023-02-21 ENCOUNTER — Encounter (HOSPITAL_COMMUNITY): Payer: Self-pay

## 2023-02-21 ENCOUNTER — Other Ambulatory Visit: Payer: Self-pay

## 2023-02-22 ENCOUNTER — Other Ambulatory Visit (HOSPITAL_COMMUNITY): Payer: Self-pay

## 2023-02-23 ENCOUNTER — Other Ambulatory Visit (HOSPITAL_COMMUNITY): Payer: Self-pay

## 2023-03-02 ENCOUNTER — Other Ambulatory Visit (INDEPENDENT_AMBULATORY_CARE_PROVIDER_SITE_OTHER): Payer: 59

## 2023-03-02 ENCOUNTER — Other Ambulatory Visit: Payer: Self-pay

## 2023-03-02 ENCOUNTER — Encounter: Payer: Self-pay | Admitting: Pharmacist

## 2023-03-02 ENCOUNTER — Other Ambulatory Visit (HOSPITAL_COMMUNITY): Payer: Self-pay

## 2023-03-02 ENCOUNTER — Telehealth (INDEPENDENT_AMBULATORY_CARE_PROVIDER_SITE_OTHER): Payer: 59 | Admitting: Medical

## 2023-03-02 VITALS — Wt 125.0 lb

## 2023-03-02 DIAGNOSIS — D849 Immunodeficiency, unspecified: Secondary | ICD-10-CM | POA: Diagnosis not present

## 2023-03-02 DIAGNOSIS — J029 Acute pharyngitis, unspecified: Secondary | ICD-10-CM | POA: Diagnosis not present

## 2023-03-02 DIAGNOSIS — R058 Other specified cough: Secondary | ICD-10-CM

## 2023-03-02 LAB — POC COVID19 BINAXNOW: SARS Coronavirus 2 Ag: NEGATIVE

## 2023-03-02 MED ORDER — DM-GUAIFENESIN ER 30-600 MG PO TB12
1.0000 | ORAL_TABLET | Freq: Two times a day (BID) | ORAL | 0 refills | Status: DC
  Filled 2023-03-02 – 2023-04-19 (×3): qty 14, 7d supply, fill #0

## 2023-03-02 MED ORDER — AMOXICILLIN 875 MG PO TABS
875.0000 mg | ORAL_TABLET | Freq: Two times a day (BID) | ORAL | 0 refills | Status: AC
  Filled 2023-03-02 (×2): qty 20, 10d supply, fill #0

## 2023-03-02 MED ORDER — HYDROCODONE BIT-HOMATROP MBR 5-1.5 MG/5ML PO SOLN
5.0000 mL | Freq: Three times a day (TID) | ORAL | 0 refills | Status: AC | PRN
  Filled 2023-03-02 (×2): qty 75, 5d supply, fill #0

## 2023-03-02 NOTE — Progress Notes (Signed)
Subjective:     Patient ID: Amanda Rich, female   DOB: 12-Dec-1966, 57 y.o.   MRN: [id number]  This visit type was conducted due to national recommendations for restrictions regarding the COVID-19 Pandemic (e.g. social distancing) in an effort to limit this patient's exposure and mitigate transmission in our community.  Due to their co-morbid illnesses, this patient is at least at moderate risk for complications without adequate follow up.  This format is felt to be most appropriate for this patient at this time.    Documentation for virtual audio and video telecommunications through [place] encounter:  The patient was located at home. The provider was located in the office. The patient did consent to this visit and is aware of possible charges through their insurance for this visit.  The other persons participating in this telemedicine service were none. Time spent on call was 20 minutes and in review of previous records 20 minutes total.  This virtual service is not related to other E/M service within previous 7 days.   HPI Chief Complaint  Patient presents with   Sore Throat    Sore throat congestion, coughing up yellowish green started 2 days ago,    Virtual consult for 2 day hx/o illness, including sore throat, chest congestion, cough, some yellow green mucous production.  Using some hot tea, lemon and honey.  Ears hurt, chest hurts.  No runny nose.  No head congestion.  No fever, no body aches, no chills.  No NVD.  Felt a little SOB yesterday.  No covid test.  Takes Symbicort and Spiriva daily.  Not having to use albuterol currently.  Not current taking mucous relief OTC.  No other aggravating or relieving factors. No other complaint.   Past Medical History:  Diagnosis Date   Anemia    Asthma    Cancer ([place])    breast cancer   Depression    Dyspnea    Family history of breast cancer    Family history of liver cancer    History of radiation therapy 03/10/21-04/11/21    Right breast- Dr. Gery Pray   Pericarditis    Smoker    Umbilical hernia    Current Outpatient Medications on File Prior to Visit  Medication Sig Dispense Refill   albuterol (PROAIR HFA) 108 (90 Base) MCG/ACT inhaler Inhale 2 puffs into the lungs every 6 (six) hours as needed for wheezing or shortness of breath. 18 g 0   anastrozole (ARIMIDEX) 1 MG tablet Take 1 tablet (1 mg total) by mouth daily. 90 tablet 3   aspirin 81 MG chewable tablet Chew 1 tablet (81 mg total) by mouth daily. 30 tablet 11   budesonide-formoterol (SYMBICORT) 80-4.5 MCG/ACT inhaler Inhale 2 puffs into the lungs in the morning and at bedtime. 10.2 g 12   Calcium-Vitamin D-Vitamin K (VIACTIV PO) Take 1 tablet by mouth daily.     Cholecalciferol 25 MCG (1000 UT) tablet Take 2 tablets (2,000 Units total) by mouth daily. 90 tablet 0   citalopram (CELEXA) 10 MG tablet Take 1 tablet (10 mg total) by mouth daily. 90 tablet 3   gabapentin (NEURONTIN) 300 MG capsule Take 1 capsule (300 mg total) by mouth 2 (two) times daily. 180 capsule 3   loratadine (CLARITIN) 10 MG tablet Take 1 tablet (10 mg total) by mouth daily. 30 tablet 0   magnesium oxide (MAG-OX) 400 (240 Mg) MG tablet Take 1 tablet (400 mg total) by mouth at bedtime. 30 tablet 11  nicotine (NICODERM CQ - DOSED IN MG/24 HOURS) 21 mg/24hr patch Place 1 patch (21 mg total) onto the skin daily. 28 patch 2   Tiotropium Bromide Monohydrate (SPIRIVA RESPIMAT) 2.5 MCG/ACT AERS Inhale 2 puffs into the lungs daily. 4 g 6   [DISCONTINUED] prochlorperazine (COMPAZINE) 10 MG tablet Take 1 tablet (10 mg total) by mouth every 6 (six) hours as needed (Nausea or vomiting). 30 tablet 1   No current facility-administered medications on file prior to visit.    Review of Systems As in subjective    Objective:   Physical Exam Due to coronavirus pandemic stay at home measures, patient visit was virtual and they were not examined in person.   Wt 125 lb (56.7 kg)   LMP 10/15/2017  (Approximate)   BMI 20.80 kg/m   Gen: wd, wn, nad No labored breathing or wheezing Mildly ill appearing      Assessment:     Encounter Diagnoses  Name Primary?   Productive cough Yes   Sore throat    Immunocompromised state ([place])        Plan:      Recommendations: I recommend rest Hydration with at least 60 ounces of water throughout the day Begin Mucinex DM for cough and congestion.   I prescribed Hycodan syrup for worse cough.  Caution as this medicine can cause drowsiness He can use Tylenol every 4-6 hours for fever or not feeling well  I recommend you come to our office back parking lot for COVID testing now.  If negative for COVID we will begin antibiotic amoxicillin as well.  If much worse over the weekend such as difficulty breathing, high fever despite over-the-counter Tylenol, uncontrollable vomiting or dehydration, then go to the emergency department  Addendum:  COVID testing was negative  Joyelle was seen today for sore throat.  Diagnoses and all orders for this visit:  Productive cough  Sore throat  Immunocompromised state ([place])    F/u prn

## 2023-03-02 NOTE — Progress Notes (Signed)
Thankfully your COVID test was negative.  Begin the medications I sent to the pharmacy

## 2023-03-12 ENCOUNTER — Ambulatory Visit
Admission: RE | Admit: 2023-03-12 | Discharge: 2023-03-12 | Disposition: A | Discharge status: Home / Self Care | Payer: 59 | Source: Ambulatory Visit | Attending: Hematology and Oncology | Admitting: Hematology and Oncology

## 2023-03-12 DIAGNOSIS — R922 Inconclusive mammogram: Secondary | ICD-10-CM | POA: Diagnosis not present

## 2023-03-12 DIAGNOSIS — C50211 Malignant neoplasm of upper-inner quadrant of right female breast: Secondary | ICD-10-CM

## 2023-03-12 DIAGNOSIS — Z853 Personal history of malignant neoplasm of breast: Secondary | ICD-10-CM | POA: Diagnosis not present

## 2023-03-12 HISTORY — DX: Personal history of irradiation: Z92.3

## 2023-03-12 HISTORY — DX: Personal history of antineoplastic chemotherapy: Z92.21

## 2023-03-18 ENCOUNTER — Other Ambulatory Visit: Payer: Self-pay

## 2023-03-19 ENCOUNTER — Other Ambulatory Visit: Payer: Self-pay

## 2023-03-19 ENCOUNTER — Other Ambulatory Visit (HOSPITAL_COMMUNITY): Payer: Self-pay

## 2023-03-22 ENCOUNTER — Other Ambulatory Visit: Payer: Self-pay

## 2023-04-19 ENCOUNTER — Other Ambulatory Visit (HOSPITAL_COMMUNITY): Payer: Self-pay

## 2023-04-19 ENCOUNTER — Telehealth: Payer: Self-pay

## 2023-04-19 ENCOUNTER — Other Ambulatory Visit: Payer: Self-pay | Admitting: Physical Medicine and Rehabilitation

## 2023-04-19 DIAGNOSIS — R0609 Other forms of dyspnea: Secondary | ICD-10-CM

## 2023-04-19 DIAGNOSIS — J432 Centrilobular emphysema: Secondary | ICD-10-CM

## 2023-04-19 MED ORDER — LORATADINE 10 MG PO TABS
10.0000 mg | ORAL_TABLET | Freq: Every day | ORAL | 0 refills | Status: AC
  Filled 2023-04-19: qty 30, 30d supply, fill #0

## 2023-04-19 NOTE — Telephone Encounter (Signed)
Refill request on Symbicort. Is this ok?

## 2023-04-23 ENCOUNTER — Other Ambulatory Visit (HOSPITAL_COMMUNITY): Payer: Self-pay

## 2023-04-23 ENCOUNTER — Other Ambulatory Visit: Payer: Self-pay

## 2023-04-23 MED ORDER — BUDESONIDE-FORMOTEROL FUMARATE 80-4.5 MCG/ACT IN AERO
2.0000 | INHALATION_SPRAY | Freq: Two times a day (BID) | RESPIRATORY_TRACT | 0 refills | Status: DC
  Filled 2023-04-23: qty 10.2, 30d supply, fill #0

## 2023-05-22 ENCOUNTER — Other Ambulatory Visit: Payer: Self-pay | Admitting: Physical Medicine and Rehabilitation

## 2023-05-22 DIAGNOSIS — R0609 Other forms of dyspnea: Secondary | ICD-10-CM

## 2023-05-22 DIAGNOSIS — J432 Centrilobular emphysema: Secondary | ICD-10-CM

## 2023-05-23 ENCOUNTER — Encounter (HOSPITAL_COMMUNITY): Payer: Self-pay

## 2023-05-23 ENCOUNTER — Other Ambulatory Visit (HOSPITAL_COMMUNITY): Payer: Self-pay

## 2023-05-23 ENCOUNTER — Other Ambulatory Visit: Payer: Self-pay

## 2023-05-23 MED ORDER — BUDESONIDE-FORMOTEROL FUMARATE 80-4.5 MCG/ACT IN AERO
2.0000 | INHALATION_SPRAY | Freq: Two times a day (BID) | RESPIRATORY_TRACT | 0 refills | Status: DC
  Filled 2023-05-23 – 2023-06-12 (×2): qty 10.2, 30d supply, fill #0

## 2023-05-24 ENCOUNTER — Telehealth: Payer: Self-pay | Admitting: Hematology and Oncology

## 2023-05-24 ENCOUNTER — Other Ambulatory Visit: Payer: Self-pay

## 2023-05-24 NOTE — Telephone Encounter (Signed)
Rescheduled appointment per provider PAL. Patient is aware of the changes made to her upcoming appointment. 

## 2023-05-25 ENCOUNTER — Other Ambulatory Visit: Payer: Self-pay

## 2023-05-25 ENCOUNTER — Ambulatory Visit (INDEPENDENT_AMBULATORY_CARE_PROVIDER_SITE_OTHER): Payer: 59 | Admitting: Family Medicine

## 2023-05-25 ENCOUNTER — Other Ambulatory Visit (HOSPITAL_COMMUNITY): Payer: Self-pay

## 2023-05-25 ENCOUNTER — Encounter: Payer: Self-pay | Admitting: Family Medicine

## 2023-05-25 VITALS — BP 100/66 | HR 70 | Temp 97.8°F | Ht 65.0 in | Wt 116.0 lb

## 2023-05-25 DIAGNOSIS — L989 Disorder of the skin and subcutaneous tissue, unspecified: Secondary | ICD-10-CM

## 2023-05-25 DIAGNOSIS — F17200 Nicotine dependence, unspecified, uncomplicated: Secondary | ICD-10-CM

## 2023-05-25 DIAGNOSIS — Z853 Personal history of malignant neoplasm of breast: Secondary | ICD-10-CM

## 2023-05-25 DIAGNOSIS — I6930 Unspecified sequelae of cerebral infarction: Secondary | ICD-10-CM

## 2023-05-25 MED ORDER — TRIAMCINOLONE ACETONIDE 0.025 % EX OINT
1.0000 | TOPICAL_OINTMENT | Freq: Two times a day (BID) | CUTANEOUS | 0 refills | Status: AC
  Filled 2023-05-25 (×2): qty 30, 15d supply, fill #0

## 2023-05-25 NOTE — Patient Instructions (Signed)
Use the triamcinolone on mosquito bites and on the area of concern on your scalp.   You should hear from a dermatology office to get these areas checked.  Let me know if you do not.   Follow up in 3 months for a follow up.

## 2023-05-25 NOTE — Progress Notes (Unsigned)
New Patient Office Visit  Subjective    Patient ID: Amanda Rich, female    DOB: 12-Feb-1966  Age: 57 y.o. MRN: [id number]  CC:  Chief Complaint  Patient presents with   Establish Care    Irritation at bottom of scalp and above ear    HPI Amanda Rich presents to establish care She was a patient of mine at my previous practice.   Here for dry skin on her scalp for the past 2 months.  Using Peppermint and Coconut oil.   Other providers: Dr. Vilma Meckel- Pulmonologist Dr. Serena Croissant - Oncologist Dr. Lavenia Atlas: [facility] [place] surgeon - removed breast surgery Dr. Raechel Chute radiologist @ cancer center Dr. Amie Critchley Dr. Laural Benes- OB/GYN  Dr. Dione Booze- eyes    She is a breast cancer survivor.  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  Out f work- on disability  Handicap placard needed   Outpatient Encounter Medications as of 05/25/2023  Medication Sig   albuterol (PROAIR HFA) 108 (90 Base) MCG/ACT inhaler Inhale 2 puffs into the lungs every 6 (six) hours as needed for wheezing or shortness of breath.   anastrozole (ARIMIDEX) 1 MG tablet Take 1 tablet (1 mg total) by mouth daily.   aspirin 81 MG chewable tablet Chew 1 tablet (81 mg total) by mouth daily.   budesonide-formoterol (SYMBICORT) 80-4.5 MCG/ACT inhaler Inhale 2 puffs into the lungs in the morning and at bedtime.   Calcium-Vitamin D-Vitamin K (VIACTIV PO) Take 1 tablet by mouth daily.   Cholecalciferol 25 MCG (1000 UT) tablet Take 2 tablets (2,000 Units total) by mouth daily.   citalopram (CELEXA) 10 MG tablet Take 1 tablet (10 mg total) by mouth daily.   gabapentin (NEURONTIN) 300 MG capsule Take 1 capsule (300 mg total) by mouth 2 (two) times daily.   loratadine (CLARITIN) 10 MG tablet Take 1 tablet (10 mg total) by mouth daily.   magnesium oxide (MAG-OX) 400 (240 Mg) MG tablet Take 1 tablet (400 mg total) by mouth at bedtime.   nicotine (NICODERM  CQ - DOSED IN MG/24 HOURS) 21 mg/24hr patch Place 1 patch (21 mg total) onto the skin daily.   Tiotropium Bromide Monohydrate (SPIRIVA RESPIMAT) 2.5 MCG/ACT AERS Inhale 2 puffs into the lungs daily.   triamcinolone (KENALOG) 0.025 % ointment Apply 1 Application topically 2 (two) times daily.   [DISCONTINUED] dextromethorphan-guaiFENesin (MUCINEX DM) 30-600 MG 12hr tablet Take 1 tablet by mouth 2 (two) times daily. (Patient not taking: Reported on 05/25/2023)   [DISCONTINUED] prochlorperazine (COMPAZINE) 10 MG tablet Take 1 tablet (10 mg total) by mouth every 6 (six) hours as needed (Nausea or vomiting).   No facility-administered encounter medications on file as of 05/25/2023.    Past Medical History:  Diagnosis Date   Anemia    Asthma    Cancer (HCC)    breast cancer   Depression    Dyspnea    Family history of breast cancer    Family history of liver cancer    History of radiation therapy 03/10/21-04/11/21   Right breast- Dr. Antony Blackbird   Pericarditis    Personal history of chemotherapy    Personal history of radiation therapy    Smoker    Umbilical hernia     Past Surgical History:  Procedure Laterality Date   ABLATION ON ENDOMETRIOSIS     BREAST BIOPSY Right    BREAST LUMPECTOMY Right 02/02/2021  BREAST LUMPECTOMY WITH RADIOACTIVE SEED AND SENTINEL LYMPH NODE BIOPSY Right 02/02/2021   Procedure: RIGHT BREAST LUMPECTOMY WITH RADIOACTIVE SEED AND RIGHT SENTINEL LYMPH NODE MAPPING;  Surgeon: Harriette Bouillon, MD;  Location: [facility];  Service: General;  Laterality: Right;   GANGLION CYST EXCISION  12/25/2005   L wrist; [facility], [place]   IR ANGIO INTRA EXTRACRAN SEL INTERNAL CAROTID BILAT MOD SED  02/06/2022   IR ANGIO INTRA EXTRACRAN SEL INTERNAL CAROTID UNI R MOD SED  03/10/2022   IR ANGIO INTRA EXTRACRAN SEL INTERNAL CAROTID UNI R MOD SED  11/02/2022   IR ANGIO VERTEBRAL SEL VERTEBRAL BILAT MOD SED  02/06/2022   IR ANGIOGRAM FOLLOW UP STUDY  03/14/2022   IR  IMAGING GUIDED PORT INSERTION  09/10/2020   IR NEURO EACH ADD'L AFTER BASIC UNI RIGHT (MS)  03/14/2022   IR TRANSCATH/EMBOLIZ  03/10/2022   RADIOLOGY WITH ANESTHESIA N/A 03/10/2022   Procedure: 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 50s, paternal second cousin   Lung cancer Other        dx. 65s, paternal cousin once removed (father's cousin)    Social History   Socioeconomic History   Marital status: Divorced    Spouse name: Not on file   Number of children: 3   Years of education: Not on file   Highest education level: Associate degree: academic program  Occupational History   Not on file  Tobacco Use   Smoking status: Every Day    Packs/day: 1.00    Years: 25.00    Additional pack years: 0.00    Total pack years: 25.00    Types: Cigarettes   Smokeless tobacco: Never   Tobacco comments:    Using Nicotine Patch 03/06/22  Vaping Use   Vaping Use: Never used  Substance and Sexual Activity   Alcohol use: Not Currently    Comment: rarely   Drug use: No   Sexual activity: Yes    Birth control/protection: Post-menopausal  Other Topics Concern   Not on file  Social History Narrative   Not on file   Social Determinants of Health   Financial Resource Strain: High Risk (09/01/2020)   Overall Financial Resource Strain (CARDIA)    Difficulty of Paying Living Expenses:  Very hard  Food Insecurity: Food Insecurity Present (09/01/2020)   Hunger Vital Sign    Worried About Running Out of Food in the Last Year: Sometimes true    Ran Out of Food in the Last Year: Never true  Transportation Needs: No Transportation Needs (09/01/2020)   PRAPARE - Administrator, Civil Service (Medical): No    Lack of Transportation (Non-Medical): No  Physical Activity: Not on file  Stress: Not on file  Social Connections: Not on file  Intimate Partner Violence: Not on file    Review of Systems  Constitutional:  Negative for chills and fever.  Respiratory:  Negative for shortness of breath.   Cardiovascular:  Negative for chest pain, palpitations and leg swelling.  Gastrointestinal:  Negative for abdominal pain, constipation, diarrhea, nausea and vomiting.  Neurological:  Negative for dizziness.        Objective    BP 100/66 (BP Location: Left Arm, Patient Position: Sitting, Cuff Size: Normal)   Pulse 70   Temp 97.8 F (36.6 C) (Temporal)   Ht 5\' 5"  (1.651 m)   Wt 116 lb (52.6 kg)   LMP 10/15/2017 (Approximate)   SpO2 98%   BMI 19.30 kg/m   Physical Exam Constitutional:      General: She is not in acute distress.    Appearance: She is not ill-appearing.  HENT:     Mouth/Throat:     Mouth: Mucous membranes are moist.  Eyes:     Extraocular Movements: Extraocular movements intact.     Conjunctiva/sclera: Conjunctivae normal.  Cardiovascular:     Rate and Rhythm: Normal rate.  Pulmonary:     Effort: Pulmonary effort is normal.  Musculoskeletal:     Cervical back: Normal range of motion and neck supple.  Skin:    General: Skin is warm and dry.     Comments: Red, raised, irregular lesion on left scalp above ear and similar lesion on posterior scalp  Neurological:     General: No focal deficit present.     Mental Status: She is alert and oriented to person, place, and time.  Psychiatric:        Mood and Affect: Mood normal.        Behavior:  Behavior normal.        Thought Content: Thought content normal.         Assessment & Plan:   Problem List Items Addressed This Visit   None Visit Diagnoses     Skin lesion of scalp    -  Primary   Relevant Orders   Ambulatory referral to Dermatology   Smokes       History of breast cancer       History of CVA with residual deficit          She will stop topical treatments and try triamcinolone.  Referral to dermatologist. Areas will most likely need biopsied.  Encourage smoking cessation.  She will continue seeing her specialists and follow up with me in 3 months for chronic health conditions.   Return for AWV with nurse in the next month and with me in 3 months. Hetty Blend, NP-C

## 2023-05-28 ENCOUNTER — Other Ambulatory Visit (HOSPITAL_COMMUNITY): Payer: Self-pay

## 2023-06-05 ENCOUNTER — Inpatient Hospital Stay: Payer: 59 | Admitting: Hematology and Oncology

## 2023-06-08 ENCOUNTER — Encounter: Payer: 59 | Attending: Physical Medicine and Rehabilitation | Admitting: Physical Medicine and Rehabilitation

## 2023-06-08 VITALS — BP 104/71 | HR 67 | Ht 65.0 in | Wt 115.2 lb

## 2023-06-08 DIAGNOSIS — G620 Drug-induced polyneuropathy: Secondary | ICD-10-CM

## 2023-06-08 DIAGNOSIS — T451X5A Adverse effect of antineoplastic and immunosuppressive drugs, initial encounter: Secondary | ICD-10-CM | POA: Diagnosis not present

## 2023-06-08 DIAGNOSIS — R4789 Other speech disturbances: Secondary | ICD-10-CM

## 2023-06-08 DIAGNOSIS — L989 Disorder of the skin and subcutaneous tissue, unspecified: Secondary | ICD-10-CM | POA: Diagnosis not present

## 2023-06-08 NOTE — Patient Instructions (Signed)
Insomnia: -Try to go outside near sunrise -Get exercise during the day.  -Turn off all devices an hour before bedtime.  -Teas that can benefit: chamomile, valerian root, Brahmi (Bacopa) -Can consider over the counter melatonin, magnesium, and/or L-theanine. Melatonin is an anti-oxidant with multiple health benefits. Magnesium is involved in greater than 300 enzymatic reactions in the body and most of us are deficient as our soil is often depleted. There are 7 different types of magnesium- Bioptemizer's is a supplement with all 7 types, and each has unique benefits. Magnesium can also help with constipation and anxiety.  -Pistachios naturally increase the production of melatonin -Cozy Earth bamboo bed sheets are free from toxic chemicals.  -Tart cherry juice or a tart cherry supplement can improve sleep and soreness post-workout    Foods that may reduce pain: 1) Ginger (especially studied for arthritis)- reduce leukotriene production to decrease inflammation 2) Blueberries- high in phytonutrients that decrease inflammation 3) Salmon- marine omega-3s reduce joint swelling and pain 4) Pumpkin seeds- reduce inflammation 5) dark chocolate- reduces inflammation 6) turmeric- reduces inflammation 7) tart cherries - reduce pain and stiffness 8) extra virgin olive oil - its compound olecanthal helps to block prostaglandins  9) chili peppers- can be eaten or applied topically via capsaicin 10) mint- helpful for headache, muscle aches, joint pain, and itching 11) garlic- reduces inflammation  Link to further information on diet for chronic pain: https://[email or web address]/[email or web address]-[email or web address]-pain   Turmeric to reduce inflammation--can be used in cooking or taken as a supplement.  Benefits of turmeric:  -Highly anti-inflammatory  -Increases antioxidants  -Improves memory, attention, brain disease  -Lowers risk of heart disease  -May help prevent  cancer  -Decreases pain  -Alleviates depression  -Delays aging and decreases risk of chronic disease  -Consume with black pepper to increase absorption    Turmeric Milk Recipe:  1 cup milk  1 tsp turmeric  1 tsp cinnamon  1 tsp grated ginger (optional)  Black pepper (boosts the anti-inflammatory properties of turmeric).  1 tsp honey  

## 2023-06-08 NOTE — Progress Notes (Signed)
Subjective:    Patient ID: Amanda Rich, female    DOB: November 12, 1966, 57 y.o.   MRN: [id number]  HPI Amanda Rich is a 57 year old woman who presents for follow-up for subcortical infarction and neuropathy  1) Subcortical infarction -having bruising from the brillinta -she was not told how long she should take the brillinta for  -her disability was just approved -she used to enjoy work and has been so used to working.  -she follows with NSGY and has arteriogram scheduled October.  -she did not like her OT, she likes PT  2) decrease in appetite -lost weight from 130 lbs to 123 lbs -felt better at a higher weight.   3) depression: -restarted celexa -has started volunteering, she has been working at [employer] -she had completely stopped celexa instead of weaning -she mises her work but does not feel she can stand that long  4) neuropathy -she takes gabapentin twice per day -feels like she is walking on nails -she can only take gabapentin once per day because it makes her loopy   Pain Inventory Average Pain 7 Pain Right Now 1 My pain is sharp, stabbing, and tingling  LOCATION OF PAIN  fingers, leg, toes  BOWEL Number of stools per week: 5 plus   BLADDER Normal    Mobility  Cane Walk 15-20 mins Climbs stairs drive Function disabled: date disabled . 07/2020  Neuro/Psych confusion  Prior Studies Any changes since last visit?  yes 10/2022    Family History  Problem Relation Age of Onset   Diabetes Mother    Hypertension Mother    Liver cancer Daughter 61   Diabetes Maternal Aunt    Breast cancer Maternal Aunt 64   Colon cancer Maternal Uncle 22       or prostate cancer   Colon cancer Cousin    Breast cancer Cousin    Autism Half-Brother    Breast cancer Other 60       maternal second cousin   Diabetes Paternal Uncle    Breast cancer Other        dx. 69s, paternal second cousin   Lung cancer Other        dx. 72s, paternal cousin once removed  (father's cousin)   Social History   Socioeconomic History   Marital status: Divorced    Spouse name: Not on file   Number of children: 3   Years of education: Not on file   Highest education level: Associate degree: academic program  Occupational History   Not on file  Tobacco Use   Smoking status: Every Day    Packs/day: 1.00    Years: 25.00    Additional pack years: 0.00    Total pack years: 25.00    Types: Cigarettes   Smokeless tobacco: Never   Tobacco comments:    Using Nicotine Patch 03/06/22  Vaping Use   Vaping Use: Never used  Substance and Sexual Activity   Alcohol use: Not Currently    Comment: rarely   Drug use: No   Sexual activity: Yes    Birth control/protection: Post-menopausal  Other Topics Concern   Not on file  Social History Narrative   Not on file   Social Determinants of Health   Financial Resource Strain: High Risk (09/01/2020)   Overall Financial Resource Strain (CARDIA)    Difficulty of Paying Living Expenses: Very hard  Food Insecurity: Food Insecurity Present (09/01/2020)   Hunger Vital Sign    Worried About  Running Out of Food in the Last Year: Sometimes true    Ran Out of Food in the Last Year: Never true  Transportation Needs: No Transportation Needs (09/01/2020)   PRAPARE - Administrator, Civil Service (Medical): No    Lack of Transportation (Non-Medical): No  Physical Activity: Not on file  Stress: Not on file  Social Connections: Not on file   Past Surgical History:  Procedure Laterality Date   ABLATION ON ENDOMETRIOSIS     BREAST BIOPSY Right    BREAST LUMPECTOMY Right 02/02/2021   BREAST LUMPECTOMY WITH RADIOACTIVE SEED AND SENTINEL LYMPH NODE BIOPSY Right 02/02/2021   Procedure: RIGHT BREAST LUMPECTOMY WITH RADIOACTIVE SEED AND RIGHT SENTINEL LYMPH NODE MAPPING;  Surgeon: Harriette Bouillon, MD;  Location: [facility];  Service: General;  Laterality: Right;   GANGLION CYST EXCISION  12/25/2005   L wrist;  [facility], [place]   IR ANGIO INTRA EXTRACRAN SEL INTERNAL CAROTID BILAT MOD SED  02/06/2022   IR ANGIO INTRA EXTRACRAN SEL INTERNAL CAROTID UNI R MOD SED  03/10/2022   IR ANGIO INTRA EXTRACRAN SEL INTERNAL CAROTID UNI R MOD SED  11/02/2022   IR ANGIO VERTEBRAL SEL VERTEBRAL BILAT MOD SED  02/06/2022   IR ANGIOGRAM FOLLOW UP STUDY  03/14/2022   IR IMAGING GUIDED PORT INSERTION  09/10/2020   IR NEURO EACH ADD'L AFTER BASIC UNI RIGHT (MS)  03/14/2022   IR TRANSCATH/EMBOLIZ  03/10/2022   RADIOLOGY WITH ANESTHESIA N/A 03/10/2022   Procedure: 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 Antony Blackbird   Pericarditis    Personal history of chemotherapy    Personal history of radiation therapy    Smoker    Umbilical hernia    BP 104/71   Pulse 67   Ht 5\' 5"  (1.651 m)   Wt 115 lb 3.2 oz (52.3 kg)   LMP 10/15/2017 (Approximate)   SpO2 97%   BMI 19.17 kg/m   Opioid Risk Score:   Fall Risk Score:  `1  Depression screen PHQ 2/9     05/25/2023    1:12 PM 03/02/2023    8:13 AM 12/05/2022   11:37 AM 08/29/2022   11:20 AM 11/07/2021   11:02 AM 10/06/2021   10:51 AM 10/06/2021   10:49 AM  Depression screen PHQ 2/9  Decreased Interest 1 0 1 2 0 0 0  Down, Depressed, Hopeless 0 0 1 1 2  0 0  PHQ - 2 Score 1 0 2 3 2  0 0  Altered sleeping    3 3 1    Tired, decreased energy    1 3 3    Change in appetite    1 0 0   Feeling bad or failure about yourself     0 0 0   Trouble concentrating    0 1 0   Moving slowly or fidgety/restless    1 0 1   Suicidal thoughts  0 0 0   PHQ-9 Score    9 9 5    Difficult doing work/chores    Very difficult Not difficult at all Not difficult at all       Review of Systems  Constitutional:  Positive for appetite change.  Respiratory:  Positive for shortness of breath.   Skin:        Random bruising  All other systems reviewed and are negative.     Objective:   Physical Exam Gen: no distress, normal appearing, weight 115 lbs, BMI 19.17 HEENT: oral mucosa pink and moist, NCAT Cardio: Reg rate Chest: normal effort, normal rate of breathing Abd: soft, non-distended Ext: no edema Psych: pleasant, normal affect Skin: intact Neuro: Alert and oriented x3, left side strength 4/5, 5/5 in right MSK: using cane for ambulation       Assessment & Plan:   1) CVA -continue HEP -would benefit from handicap placard to increase mobility in the community -reviewed all medications and provided necessary refills. -continue use of cane -discussed choosing anti-inflammatory foods -recommended decreasing sugar as much as possible.   2) Bruising -discussed that this could be from the [place], recommended following up with surgeon regarding duraiton of Brillinta  3) Brain fog -encouraged continued exercise -encouraged walnuts, blueberries, salmons -can consider Cefazolin NAC -discussed that more noticeable when she is tired  4) Insomnia: -recommended taking gabapentin 300mg  HS -Try to go outside near sunrise -Get exercise during the day.  -Turn off all devices an hour before bedtime.  -Teas that can benefit: chamomile, valerian root, Brahmi (Bacopa) -Can consider over the counter melatonin, magnesium, and/or L-theanine. Melatonin is an anti-oxidant with multiple health benefits. Magnesium is involved in greater than 300 enzymatic reactions in the body and most of [place] are deficient as our soil is often depleted. There are 7 different types of magnesium- Bioptemizer's is a supplement with all 7 types, and each has  unique benefits. Magnesium can also help with constipation and anxiety.  -Pistachios naturally increase the production of melatonin -Cozy Earth bamboo bed sheets are free from toxic chemicals.  -Tart cherry juice or a tart cherry supplement can improve sleep and soreness post-workout    5) Neuropathy Prescribed Zynex Nexwave  Turmeric to reduce inflammation--can be used in cooking or taken as a supplement.  Benefits of turmeric:  -Highly anti-inflammatory  -Increases antioxidants  -Improves memory, attention, brain disease  -Lowers risk of heart disease  -May help prevent cancer  -Decreases pain  -Alleviates depression  -Delays aging and decreases risk of chronic disease  -Consume with black pepper to increase absorption    Turmeric Milk Recipe:  1 cup milk  1 tsp turmeric  1 tsp cinnamon  1 tsp grated ginger (optional)  Black pepper (boosts the anti-inflammatory properties of turmeric).  1 tsp honey  -Discussed current symptoms of pain and history of pain.  -Discussed benefits of exercise in reducing pain. -Discussed following foods that may reduce pain: 1) Ginger (especially studied for arthritis)- reduce leukotriene production to decrease inflammation 2) Blueberries- high in phytonutrients that decrease inflammation 3) Salmon- marine omega-3s reduce joint swelling and pain 4) Pumpkin seeds- reduce inflammation 5) dark chocolate- reduces inflammation 6) turmeric- reduces inflammation 7) tart cherries - reduce pain and stiffness 8) extra virgin olive oil - its compound olecanthal helps to block prostaglandins  9) chili peppers- can be eaten or applied topically via capsaicin 10) mint- helpful for headache, muscle aches, joint pain, and itching 11) garlic- reduces inflammation  Link  to further information on diet for chronic pain: [email or web address].bray.com/   6) Word finding  difficulty: -recommended avoiding activities that cause fatigue  7) Skin lesion:  -referred to dermatology

## 2023-06-11 ENCOUNTER — Inpatient Hospital Stay: Payer: 59 | Admitting: Hematology and Oncology

## 2023-06-11 NOTE — Assessment & Plan Note (Deleted)
Patient palpated a right breast lump x14yr and a left breast lump x56yr. Diagnostic mammogram and US showed in the right breast, a 2.7cm mass 5cm from the nipple and 0.8cm mass 1cm from the nipple at the 12:30 position, with borderline cortical thickening in the right axilla, and in the left breast, a 4.0cm mass at the 11 o'clock position representing a hamartoma. 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   Treatment plan: 1. Neoadjuvant chemotherapy with TCH Perjeta 6 cycles completed 01/18/2021 followed by Herceptin Perjeta maintenance for 1 year completed 08/30/2021 2. Followed by breast conserving surgery if possible with sentinel lymph node study 02/02/2021: Complete pathologic response, 0/4 lymph nodes negative 3. Followed by adjuvant radiation therapy 03/14/21- 04/08/21 4.  Followed by adjuvant antiestrogen therapy with letrozole started 06/28/2021, changed to anastrozole  ------------------------------------------------------------------------------------------------------------------------------------------- Current treatment: Anastrozole    CT Neck  and Chest: 05/20/22: Benign Anastrozole Toxicities: Hot flashes (better if she drinks less coffee)   Mammograms: 03/12/2023: Benign, breast density category C Pain in the right upper rib cage: Cording on the lateral aspect of the right lower chest into the abdomen.  This feels tender when it is stretched.  I encouraged her to massage the area and try and stretch out the cording.

## 2023-06-12 ENCOUNTER — Other Ambulatory Visit (HOSPITAL_COMMUNITY): Payer: Self-pay

## 2023-06-13 ENCOUNTER — Ambulatory Visit (INDEPENDENT_AMBULATORY_CARE_PROVIDER_SITE_OTHER): Payer: 59

## 2023-06-13 ENCOUNTER — Other Ambulatory Visit: Payer: Self-pay

## 2023-06-13 VITALS — Ht 65.0 in | Wt 115.0 lb

## 2023-06-13 DIAGNOSIS — Z122 Encounter for screening for malignant neoplasm of respiratory organs: Secondary | ICD-10-CM

## 2023-06-13 DIAGNOSIS — F32A Depression, unspecified: Secondary | ICD-10-CM

## 2023-06-13 DIAGNOSIS — Z0000 Encounter for general adult medical examination without abnormal findings: Secondary | ICD-10-CM | POA: Diagnosis not present

## 2023-06-13 MED ORDER — CITALOPRAM HYDROBROMIDE 10 MG PO TABS
10.0000 mg | ORAL_TABLET | Freq: Every day | ORAL | 3 refills | Status: DC
  Filled 2023-06-13: qty 90, 90d supply, fill #0
  Filled 2023-10-11: qty 90, 90d supply, fill #1

## 2023-06-13 NOTE — Patient Instructions (Addendum)
[name] , Thank you for taking time to come for your Medicare Wellness Visit. I appreciate your ongoing commitment to your health goals. Please review the following plan we discussed and let me know if I can assist you in the future.   These are the goals we discussed:  Goals      My goal for 2024 is to get back into the [employer], increase physical activity, eat healthy and get back up to 135 pounds.        This is a list of the screening recommended for you and due dates:  Health Maintenance  Topic Date Due   Zoster (Shingles) Vaccine (1 of 2) Never done   COVID-19 Vaccine (6 - 2023-24 season) 03/28/2023   Screening for Lung Cancer  05/19/2023   Flu Shot  07/26/2023   Medicare Annual Wellness Visit  06/12/2024   Pap Smear  11/07/2024   Mammogram  03/11/2025   DTaP/Tdap/Td vaccine (2 - Td or Tdap) 09/22/2026   Colon Cancer Screening  03/29/2028   Hepatitis C Screening  Completed   HIV Screening  Completed   HPV Vaccine  Aged Out    Advanced directives: Yes  Conditions/risks identified: Yes  Next appointment: It was nice speaking with you today!  Please follow up in one year for your annual wellness visit via telephone call with Nurse Percell Miller on 06/18/2024 at 3:00 p.m.  If you need to cancel or reschedule please call [phone].  Preventive Care 40-64 Years, Female Preventive care refers to lifestyle choices and visits with your health care provider that can promote health and wellness. What does preventive care include? A yearly physical exam. This is also called an annual well check. Dental exams once or twice a year. Routine eye exams. Ask your health care provider how often you should have your eyes checked. Personal lifestyle choices, including: Daily care of your teeth and gums. Regular physical activity. Eating a healthy diet. Avoiding tobacco and drug use. Limiting alcohol use. Practicing safe sex. Taking low-dose aspirin daily starting at age 23. Taking vitamin  and mineral supplements as recommended by your health care provider. What happens during an annual well check? The services and screenings done by your health care provider during your annual well check will depend on your age, overall health, lifestyle risk factors, and family history of disease. Counseling  Your health care provider may ask you questions about your: Alcohol use. Tobacco use. Drug use. Emotional well-being. Home and relationship well-being. Sexual activity. Eating habits. Work and work [employer]. Method of birth control. Menstrual cycle. Pregnancy history. Screening  You may have the following tests or measurements: Height, weight, and BMI. Blood pressure. Lipid and cholesterol levels. These may be checked every 5 years, or more frequently if you are over 19 years old. Skin check. Lung cancer screening. You may have this screening every year starting at age 67 if you have a 30-pack-year history of smoking and currently smoke or have quit within the past 15 years. Fecal occult blood test (FOBT) of the stool. You may have this test every year starting at age 105. Flexible sigmoidoscopy or colonoscopy. You may have a sigmoidoscopy every 5 years or a colonoscopy every 10 years starting at age 28. Hepatitis C blood test. Hepatitis B blood test. Sexually transmitted disease (STD) testing. Diabetes screening. This is done by checking your blood sugar (glucose) after you have not eaten for a while (fasting). You may have this done every 1-3 years. Mammogram. This  may be done every 1-2 years. Talk to your health care provider about when you should start having regular mammograms. This may depend on whether you have a family history of breast cancer. BRCA-related cancer screening. This may be done if you have a family history of breast, ovarian, tubal, or peritoneal cancers. Pelvic exam and Pap test. This may be done every 3 years starting at age 5. Starting at age 71, this  may be done every 5 years if you have a Pap test in combination with an HPV test. Bone density scan. This is done to screen for osteoporosis. You may have this scan if you are at high risk for osteoporosis. Discuss your test results, treatment options, and if necessary, the need for more tests with your health care provider. Vaccines  Your health care provider may recommend certain vaccines, such as: Influenza vaccine. This is recommended every year. Tetanus, diphtheria, and acellular pertussis (Tdap, Td) vaccine. You may need a Td booster every 10 years. Zoster vaccine. You may need this after age 86. Pneumococcal 13-valent conjugate (PCV13) vaccine. You may need this if you have certain conditions and were not previously vaccinated. Pneumococcal polysaccharide (PPSV23) vaccine. You may need one or two doses if you smoke cigarettes or if you have certain conditions. Talk to your health care provider about which screenings and vaccines you need and how often you need them. This information is not intended to replace advice given to you by your health care provider. Make sure you discuss any questions you have with your health care provider. Document Released: 01/07/2016 Document Revised: 08/30/2016 Document Reviewed: 10/12/2015 Elsevier Interactive Patient Education  2017 [employer].    Fall Prevention in the Home Falls can cause injuries. They can happen to people of all ages. There are many things you can do to make your home safe and to help prevent falls. What can I do on the outside of my home? Regularly fix the edges of walkways and driveways and fix any cracks. Remove anything that might make you trip as you walk through a door, such as a raised step or threshold. Trim any bushes or trees on the path to your home. Use bright outdoor lighting. Clear any walking paths of anything that might make someone trip, such as rocks or tools. Regularly check to see if handrails are loose or  broken. Make sure that both sides of any steps have handrails. Any raised decks and porches should have guardrails on the edges. Have any leaves, snow, or ice cleared regularly. Use sand or salt on walking paths during winter. Clean up any spills in your garage right away. This includes oil or grease spills. What can I do in the bathroom? Use night lights. Install grab bars by the toilet and in the tub and shower. Do not use towel bars as grab bars. Use non-skid mats or decals in the tub or shower. If you need to sit down in the shower, use a plastic, non-slip stool. Keep the floor dry. Clean up any water that spills on the floor as soon as it happens. Remove soap buildup in the tub or shower regularly. Attach bath mats securely with double-sided non-slip rug tape. Do not have throw rugs and other things on the floor that can make you trip. What can I do in the bedroom? Use night lights. Make sure that you have a light by your bed that is easy to reach. Do not use any sheets or blankets that are too  big for your bed. They should not hang down onto the floor. Have a firm chair that has side arms. You can use this for support while you get dressed. Do not have throw rugs and other things on the floor that can make you trip. What can I do in the kitchen? Clean up any spills right away. Avoid walking on wet floors. Keep items that you use a lot in easy-to-reach places. If you need to reach something above you, use a strong step stool that has a grab bar. Keep electrical cords out of the way. Do not use floor polish or wax that makes floors slippery. If you must use wax, use non-skid floor wax. Do not have throw rugs and other things on the floor that can make you trip. What can I do with my stairs? Do not leave any items on the stairs. Make sure that there are handrails on both sides of the stairs and use them. Fix handrails that are broken or loose. Make sure that handrails are as long as  the stairways. Check any carpeting to make sure that it is firmly attached to the stairs. Fix any carpet that is loose or worn. Avoid having throw rugs at the top or bottom of the stairs. If you do have throw rugs, attach them to the floor with carpet tape. Make sure that you have a light switch at the top of the stairs and the bottom of the stairs. If you do not have them, ask someone to add them for you. What else can I do to help prevent falls? Wear shoes that: Do not have high heels. Have rubber bottoms. Are comfortable and fit you well. Are closed at the toe. Do not wear sandals. If you use a stepladder: Make sure that it is fully opened. Do not climb a closed stepladder. Make sure that both sides of the stepladder are locked into place. Ask someone to hold it for you, if possible. Clearly mark and make sure that you can see: Any grab bars or handrails. First and last steps. Where the edge of each step is. Use tools that help you move around (mobility aids) if they are needed. These include: Canes. Walkers. Scooters. Crutches. Turn on the lights when you go into a dark area. Replace any light bulbs as soon as they burn out. Set up your furniture so you have a clear path. Avoid moving your furniture around. If any of your floors are uneven, fix them. If there are any pets around you, be aware of where they are. Review your medicines with your doctor. Some medicines can make you feel dizzy. This can increase your chance of falling. Ask your doctor what other things that you can do to help prevent falls. This information is not intended to replace advice given to you by your health care provider. Make sure you discuss any questions you have with your health care provider. Document Released: 10/07/2009 Document Revised: 05/18/2016 Document Reviewed: 01/15/2015 Elsevier Interactive Patient Education  2017 [employer].

## 2023-06-13 NOTE — Progress Notes (Addendum)
Subjective:   Amanda Rich is a 57 y.o. female who presents for an Initial Medicare Annual Wellness Visit.  Visit Complete: Virtual  I connected with  Amanda Rich on 06/13/23 by a audio enabled telemedicine application and verified that I am speaking with the correct person using two identifiers.  Patient Location: Home  Provider Location: Office/Clinic  I discussed the limitations of evaluation and management by telemedicine. The patient expressed understanding and agreed to proceed.  Review of Systems     Cardiac Risk Factors include: advanced age (>72men, >54 women);dyslipidemia;family history of premature cardiovascular disease;smoking/ tobacco exposure     Objective:    Today's Vitals   06/13/23 1338  Weight: 115 lb (52.2 kg)  Height: 5\' 5"  (1.651 m)  PainSc: 0-No pain   Body mass index is 19.14 kg/m.     06/13/2023    1:39 PM 11/02/2022    6:57 AM 11/01/2022    8:26 AM 03/14/2022    1:40 PM 03/10/2022    2:00 PM 03/07/2022    1:20 PM 02/21/2022    9:08 AM  Advanced Directives  Does Patient Have a Medical Advance Directive? Yes Yes Yes No No No   Type of Estate agent of [place];Living will Healthcare Power of [place];Living will Healthcare Power of [place];Living will    Healthcare Power of [place];Living will  Does patient want to make changes to medical advance directive?  No - Patient declined No - Patient declined No - Patient declined     Copy of Healthcare Power of Attorney in Chart? No - copy requested        Would patient like information on creating a medical advance directive?   No - Patient declined No - Patient declined No - Patient declined      Current Medications (verified) Outpatient Encounter Medications as of 06/13/2023  Medication Sig   albuterol (PROAIR HFA) 108 (90 Base) MCG/ACT inhaler Inhale 2 puffs into the lungs every 6 (six) hours as needed for wheezing or shortness of breath.   anastrozole (ARIMIDEX) 1 MG  tablet Take 1 tablet (1 mg total) by mouth daily.   aspirin 81 MG chewable tablet Chew 1 tablet (81 mg total) by mouth daily.   budesonide-formoterol (SYMBICORT) 80-4.5 MCG/ACT inhaler Inhale 2 puffs into the lungs in the morning and at bedtime.   Calcium-Vitamin D-Vitamin K (VIACTIV PO) Take 1 tablet by mouth daily.   Cholecalciferol 25 MCG (1000 UT) tablet Take 2 tablets (2,000 Units total) by mouth daily.   citalopram (CELEXA) 10 MG tablet Take 1 tablet (10 mg total) by mouth daily.   gabapentin (NEURONTIN) 300 MG capsule Take 1 capsule (300 mg total) by mouth 2 (two) times daily.   loratadine (CLARITIN) 10 MG tablet Take 1 tablet (10 mg total) by mouth daily.   magnesium oxide (MAG-OX) 400 (240 Mg) MG tablet Take 1 tablet (400 mg total) by mouth at bedtime.   nicotine (NICODERM CQ - DOSED IN MG/24 HOURS) 21 mg/24hr patch Place 1 patch (21 mg total) onto the skin daily.   Tiotropium Bromide Monohydrate (SPIRIVA RESPIMAT) 2.5 MCG/ACT AERS Inhale 2 puffs into the lungs daily.   triamcinolone (KENALOG) 0.025 % ointment Apply 1 Application topically 2 (two) times daily.   [DISCONTINUED] prochlorperazine (COMPAZINE) 10 MG tablet Take 1 tablet (10 mg total) by mouth every 6 (six) hours as needed (Nausea or vomiting).   No facility-administered encounter medications on file as of 06/13/2023.    Allergies (verified)  Clarithromycin, Lyrica [pregabalin], Tape, and Ivp dye [iodinated contrast media]   History: Past Medical History:  Diagnosis Date   Anemia    Asthma    Cancer (HCC)    breast cancer   Depression    Dyspnea    Family history of breast cancer    Family history of liver cancer    History of radiation therapy 03/10/21-04/11/21   Right breast- Dr. Antony Blackbird   Pericarditis    Personal history of chemotherapy    Personal history of radiation therapy    Smoker    Umbilical hernia    Past Surgical History:  Procedure Laterality Date   ABLATION ON ENDOMETRIOSIS     BREAST  BIOPSY Right    BREAST LUMPECTOMY Right 02/02/2021   BREAST LUMPECTOMY WITH RADIOACTIVE SEED AND SENTINEL LYMPH NODE BIOPSY Right 02/02/2021   Procedure: RIGHT BREAST LUMPECTOMY WITH RADIOACTIVE SEED AND RIGHT SENTINEL LYMPH NODE MAPPING;  Surgeon: Harriette Bouillon, MD;  Location: [facility];  Service: General;  Laterality: Right;   GANGLION CYST EXCISION  12/25/2005   L wrist; [facility], [place]   IR ANGIO INTRA EXTRACRAN SEL INTERNAL CAROTID BILAT MOD SED  02/06/2022   IR ANGIO INTRA EXTRACRAN SEL INTERNAL CAROTID UNI R MOD SED  03/10/2022   IR ANGIO INTRA EXTRACRAN SEL INTERNAL CAROTID UNI R MOD SED  11/02/2022   IR ANGIO VERTEBRAL SEL VERTEBRAL BILAT MOD SED  02/06/2022   IR ANGIOGRAM FOLLOW UP STUDY  03/14/2022   IR IMAGING GUIDED PORT INSERTION  09/10/2020   IR NEURO EACH ADD'L AFTER BASIC UNI RIGHT (MS)  03/14/2022   IR TRANSCATH/EMBOLIZ  03/10/2022   RADIOLOGY WITH ANESTHESIA N/A 03/10/2022   Procedure: 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 9s, paternal second cousin   Lung cancer Other        dx. 77s, paternal cousin once removed (father's cousin)   Social History   Socioeconomic History   Marital status: Divorced    Spouse name: Not on file   Number of children: 3   Years of education: Not on file   Highest education level: Associate degree: academic  program  Occupational History   Not on file  Tobacco Use   Smoking status: Every Day    Packs/day: 1.00    Years: 25.00    Additional pack years: 0.00    Total pack years: 25.00    Types: Cigarettes   Smokeless tobacco: Never   Tobacco comments:    Using Nicotine Patch 03/06/22  Vaping Use   Vaping Use: Never used  Substance and Sexual Activity   Alcohol use: Not Currently    Comment: rarely   Drug  use: No   Sexual activity: Yes    Birth control/protection: Post-menopausal  Other Topics Concern   Not on file  Social History Narrative   Not on file   Social Determinants of Health   Financial Resource Strain: Low Risk  (06/13/2023)   Overall Financial Resource Strain (CARDIA)    Difficulty of Paying Living Expenses: Not hard at all  Food Insecurity: No Food Insecurity (06/13/2023)   Hunger Vital Sign    Worried About Running Out of Food in the Last Year: Never true    Ran Out of Food in the Last Year: Never true  Transportation Needs: No Transportation Needs (06/13/2023)   PRAPARE - Administrator, Civil Service (Medical): No    Lack of Transportation (Non-Medical): No  Physical Activity: Sufficiently Active (06/13/2023)   Exercise Vital Sign    Days of Exercise per Week: 5 days    Minutes of Exercise per Session: 30 min  Stress: No Stress Concern Present (06/13/2023)   [employer] of Occupational Health - Occupational Stress Questionnaire    Feeling of Stress : Not at all  Social Connections: Socially Integrated (06/13/2023)   Social Connection and Isolation Panel [NHANES]    Frequency of Communication with Friends and Family: More than three times a week    Frequency of Social Gatherings with Friends and Family: More than three times a week    Attends Religious Services: More than 4 times per year    Active Member of [employer] or Organizations: Yes    Attends Engineer, structural: More than 4 times per year    Marital Status: Married    Tobacco  Counseling Ready to quit: Not Answered Counseling given: Not Answered Tobacco comments: Using Nicotine Patch 03/06/22   Clinical Intake:  Pre-visit preparation completed: Yes  Pain : No/denies pain Pain Score: 0-No pain     BMI - recorded: 19.14 Nutritional Status: BMI of 19-24  Normal Nutritional Risks: None Diabetes: No  How often do you need to have someone help you when you read instructions, pamphlets, or other written materials from your doctor or pharmacy?: 1 - Never What is the last grade level you completed in school?: Associate's Degree  Interpreter Needed?: No  Information entered by :: [place] Doig N. Nicholson Starace, LPN.   Activities of Daily Living    06/13/2023    1:43 PM 11/02/2022    6:56 AM  In your present state of health, do you have any difficulty performing the following activities:  Hearing? 0   Vision? 0   Difficulty concentrating or making decisions? 0   Walking or climbing stairs? 0 0  Dressing or bathing? 0   Doing errands, shopping? 0   Preparing Food and eating ? N   Using the Toilet? N   In the past six months, have you accidently leaked urine? N   Do you have problems with loss of bowel control? N   Managing your Medications? N   Managing your Finances? N   Housekeeping or managing your Housekeeping? N     Patient Care Team: Avanell Shackleton, NP-C as PCP - General (Family Medicine) Quintella Reichert, MD as PCP - Cardiology (Cardiology) Harriette Bouillon, MD as Consulting Physician (General Surgery) Serena Croissant, MD as Consulting Physician (Hematology and Oncology) Antony Blackbird, MD as Consulting Physician (Radiation Oncology)  Indicate any recent Medical Services you may have received from other than [facility] providers in the past year (date may be approximate).  Assessment:   This is a routine wellness examination for [place].  Hearing/Vision screen Hearing Screening - Comments:: Denies hearing difficulties.  Vision Screening - Comments::  Wears reading glasses - up to date with routine eye exams with [facility]   Dietary issues and exercise activities discussed:     Goals Addressed             This Visit's Progress    My goal for 2024 is to get back into the [employer], increase physical activity, eat healthy and get back up to 135 pounds.        Depression Screen    06/13/2023    1:41 PM 05/25/2023    1:12 PM 03/02/2023    8:13 AM 12/05/2022   11:37 AM 08/29/2022   11:20 AM 11/07/2021   11:02 AM 10/06/2021   10:51 AM  PHQ 2/9 Scores  PHQ - 2 Score 0 1 0 2 3 2  0  PHQ- 9 Score 4    9 9 5     Fall Risk    06/13/2023    1:41 PM 05/25/2023    1:11 PM 03/02/2023    8:13 AM 12/05/2022   11:37 AM 08/29/2022   11:21 AM  Fall Risk   Falls in the past year? 0 0 0 0 0  Number falls in past yr: 0 0 0 0   Injury with Fall? 0 0 0 0   Risk for fall due to : No Fall Risks No Fall Risks No Fall Risks    Follow up Falls prevention discussed Falls evaluation completed Falls evaluation completed      MEDICARE RISK AT HOME:  Medicare Risk at Home - 06/13/23 1345     Any stairs in or around the home? No    If so, are there any without handrails? No    Home free of loose throw rugs in walkways, pet beds, electrical cords, etc? Yes    Adequate lighting in your home to reduce risk of falls? Yes    Life alert? No    Use of a cane, walker or w/c? No    Grab bars in the bathroom? No    Shower chair or bench in shower? No    Elevated toilet seat or a handicapped toilet? No             TIMED UP AND GO:  Was the test performed? No    Cognitive Function:        06/13/2023    1:44 PM  6CIT Screen  What Year? 0 points  What month? 0 points  What time? 0 points  Count back from 20 0 points  Months in reverse 0 points  Repeat phrase 0 points  Total Score 0 points    Immunizations Immunization History  Administered Date(s) Administered   COVID-19, mRNA, vaccine(Comirnaty)12 years and older 01/31/2023    Influenza,inj,Quad PF,6+ Mos 10/25/2018, 10/04/2020, 01/31/2023   PFIZER Comirnaty(Gray Top)Covid-19 Tri-Sucrose Vaccine 06/28/2021   PFIZER(Purple Top)SARS-COV-2 Vaccination 03/25/2020, 04/19/2020   Pfizer Covid-19 Vaccine Bivalent Booster 73yrs & up 12/08/2021   Tdap 09/22/2016    TDAP status: Up to date  Flu Vaccine status: Up to date  Pneumococcal vaccine status: Due, Education has been provided regarding the importance of this vaccine. Advised may receive this vaccine at local pharmacy or Health Dept. Aware to provide a copy of the vaccination record if obtained from local pharmacy or Health Dept. Verbalized acceptance and understanding.  Covid-19 vaccine status: Completed vaccines  Qualifies for Shingles Vaccine? Yes   Zostavax completed No   Shingrix Completed?: No.    Education has been provided regarding the importance of this vaccine. Patient has been advised to call insurance company to determine out of pocket expense if they have not yet received this vaccine. Advised may also receive vaccine at local pharmacy or Health Dept. Verbalized acceptance and understanding.  Screening Tests Health Maintenance  Topic Date Due   Zoster Vaccines- Shingrix (1 of 2) Never done   COVID-19 Vaccine (6 - 2023-24 season) 03/28/2023   Lung Cancer Screening  05/19/2023   INFLUENZA VACCINE  07/26/2023   Medicare Annual Wellness (AWV)  06/12/2024   PAP SMEAR-Modifier  11/07/2024   MAMMOGRAM  03/11/2025   DTaP/Tdap/Td (2 - Td or Tdap) 09/22/2026   Colonoscopy  03/29/2028   Hepatitis C Screening  Completed   HIV Screening  Completed   HPV VACCINES  Aged Out    Health Maintenance  Health Maintenance Due  Topic Date Due   Zoster Vaccines- Shingrix (1 of 2) Never done   COVID-19 Vaccine (6 - 2023-24 season) 03/28/2023   Lung Cancer Screening  05/19/2023    Colorectal cancer screening: Type of screening: Colonoscopy. Completed 03/29/2018. Repeat every 10 years  Mammogram status:  Completed 03/12/2023. Repeat every year  Bone Density status: Completed 06/01/2022. Results reflect: Bone density results: NORMAL. Repeat every 5 years.  Lung Cancer Screening: (Low Dose CT Chest recommended if Age 28-80 years, 20 pack-year currently smoking OR have quit w/in 15years.) does qualify.   Lung Cancer Screening Referral: ordered 06/13/2023  Additional Screening:  Hepatitis C Screening: does qualify; Completed 10/06/2021  Vision Screening: Recommended annual ophthalmology exams for early detection of glaucoma and other disorders of the eye. Is the patient up to date with their annual eye exam?  Yes  Who is the provider or what is the name of the office in which the patient attends annual eye exams? [facility] If pt is not established with a provider, would they like to be referred to a provider to establish care? No .   Dental Screening: Recommended annual dental exams for proper oral hygiene  Diabetic Foot Exam: N/A  Community Resource Referral / Chronic Care Management: CRR required this visit?  No   CCM required this visit?  No     Plan:     I have personally reviewed and noted the following in the patient's chart:   Medical and social history Use of alcohol, tobacco or illicit drugs  Current medications and supplements including opioid prescriptions. Patient is not currently taking opioid prescriptions. Functional ability and status Nutritional status Physical activity Advanced directives List of other physicians Hospitalizations, surgeries, and ER visits in previous 12 months Vitals Screenings to include cognitive, depression, and falls Referrals and appointments  In addition, I have reviewed and discussed with patient certain preventive protocols, quality metrics, and best practice recommendations. A written personalized care plan for preventive services as well as general preventive health recommendations were provided to patient.     Mickeal Needy, LPN   1/61/0960   After Visit Summary: (Mail) Due to this being a telephonic visit, the after visit summary with patients personalized plan was offered to patient via mail   Nurse Notes: Normal cognitive status assessed by direct observation via telephone conversation by this Nurse Health Advisor. No abnormalities found.

## 2023-06-14 ENCOUNTER — Other Ambulatory Visit (HOSPITAL_COMMUNITY): Payer: Self-pay

## 2023-06-14 ENCOUNTER — Telehealth: Payer: Self-pay

## 2023-06-14 ENCOUNTER — Other Ambulatory Visit: Payer: Self-pay

## 2023-06-14 NOTE — Telephone Encounter (Signed)
Left voice message for patient regarding referral to dermatology, advising that referral is pending and to be looking out for call regarding date and time.  Also per Larene Beach, NP-C patient can disregard referral for Lung Cancer Screening since she is already a pulmonary patient with [facility].  Analis Distler N. Guinevere Stephenson, LPN. [facility] AWV Team Direct Dial: [phone]

## 2023-06-15 ENCOUNTER — Other Ambulatory Visit: Payer: Self-pay

## 2023-06-26 NOTE — Progress Notes (Signed)
Patient Care Team: Avanell Shackleton, NP-C as PCP - General (Family Medicine) Quintella Reichert, MD as PCP - Cardiology (Cardiology) Harriette Bouillon, MD as Consulting Physician (General Surgery) Serena Croissant, MD as Consulting Physician (Hematology and Oncology) Antony Blackbird, MD as Consulting Physician (Radiation Oncology)  DIAGNOSIS: No diagnosis found.  SUMMARY OF ONCOLOGIC HISTORY: Oncology History  Malignant neoplasm of 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, estrogen receptor positive (HCC)  08/25/2020 Initial Diagnosis   Patient palpated a right breast lump x69yr and a left breast lump x29yr. Diagnostic mammogram and US showed in the right breast, a 2.7cm mass 5cm from the nipple and 0.8cm mass 1cm from the nipple at the 12:30 position, with borderline cortical thickening in the right axilla, and in the left breast, a 4.0cm mass at the 11 o'clock position representing a hamartoma. 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   09/13/2020 - 01/18/2021 Chemotherapy   Neoadjuvant chemotherapy with [facility] Perjeta       02/02/2021 Surgery   Right lumpectomy ([facility]): no residual carcinoma, 4 right axillary lymph nodes negative for carcinoma.   02/21/2021 - 08/30/2021 Chemotherapy      Patient is on Antibody Plan: BREAST TRASTUZUMAB + PERTUZUMAB Q21D     03/11/2021 - 04/11/2021 Radiation Therapy   Adjuvant radiation   09/2021 -  Anti-estrogen oral therapy   Anastrozole daily     CHIEF COMPLIANT:  Follow-up right breast cancer   INTERVAL HISTORY: Amanda Rich is a 57 y.o old with the above-mentioned right breast cancer currently on anastrozole. She presents to the clinic for a follow-up.   ALLERGIES:  is allergic to clarithromycin, lyrica [pregabalin], tape, and ivp dye [iodinated contrast media].  MEDICATIONS:  Current Outpatient  Medications  Medication Sig Dispense Refill   albuterol (PROAIR HFA) 108 (90 Base) MCG/ACT inhaler Inhale 2 puffs into the lungs every 6 (six) hours as needed for wheezing or shortness of breath. 18 g 0   anastrozole (ARIMIDEX) 1 MG tablet Take 1 tablet (1 mg total) by mouth daily. 90 tablet 3   aspirin 81 MG chewable tablet Chew 1 tablet (81 mg total) by mouth daily. 30 tablet 11   budesonide-formoterol (SYMBICORT) 80-4.5 MCG/ACT inhaler Inhale 2 puffs into the lungs in the morning and at bedtime. 10.2 g 0   Calcium-Vitamin D-Vitamin K (VIACTIV PO) Take 1 tablet by mouth daily.     Cholecalciferol 25 MCG (1000 UT) tablet Take 2 tablets (2,000 Units total) by mouth daily. 90 tablet 0   citalopram (CELEXA) 10 MG tablet Take 1 tablet (10 mg total) by mouth daily. 90 tablet 3   gabapentin (NEURONTIN) 300 MG capsule Take 1 capsule (300 mg total) by mouth 2 (two) times daily. 180 capsule 3   loratadine (CLARITIN) 10 MG tablet Take 1 tablet (10 mg total) by mouth daily. 30 tablet 0   magnesium oxide (MAG-OX) 400 (240 Mg) MG tablet Take 1 tablet (400 mg total) by mouth at bedtime. 30 tablet 11   nicotine (NICODERM CQ - DOSED IN MG/24 HOURS) 21 mg/24hr patch Place 1 patch (21 mg total) onto the skin daily. 28 patch 2   Tiotropium Bromide Monohydrate (SPIRIVA RESPIMAT) 2.5 MCG/ACT AERS Inhale 2 puffs into the lungs daily. 4 g 6   triamcinolone (KENALOG) 0.025 % ointment Apply 1 Application topically 2 (two) times  daily. 30 g 0   No current facility-administered medications for this visit.    PHYSICAL EXAMINATION: ECOG PERFORMANCE STATUS: {CHL ONC ECOG PS:[id number]}  There were no vitals filed for this visit. There were no vitals filed for this visit.  BREAST:*** No palpable masses or nodules in either right or left breasts. No palpable axillary supraclavicular or infraclavicular adenopathy no breast tenderness or nipple discharge. (exam performed in the presence of a chaperone)  LABORATORY DATA:   I have reviewed the data as listed    Latest Ref Rng & Units 11/02/2022    8:06 AM 03/15/2022    5:30 AM 03/07/2022    1:31 PM  CMP  Glucose 70 - 99 mg/dL 161  86  87   BUN 6 - 20 mg/dL 15  11  6    Creatinine 0.44 - 1.00 mg/dL 0.96  0.45  4.09   Sodium 135 - 145 mmol/L 142  138  139   Potassium 3.5 - 5.1 mmol/L 3.6  3.6  3.8   Chloride 98 - 111 mmol/L 106  102  106   CO2 22 - 32 mmol/L  26  26   Calcium 8.9 - 10.3 mg/dL  9.0  9.5   Total Protein 6.5 - 8.1 g/dL  5.6    Total Bilirubin 0.3 - 1.2 mg/dL  0.5    Alkaline Phos 38 - 126 U/L  41    AST 15 - 41 U/L  22    ALT 0 - 44 U/L  23      Lab Results  Component Value Date   WBC 7.8 03/15/2022   HGB 13.3 11/02/2022   HCT 39.0 11/02/2022   MCV 86.4 03/15/2022   PLT 246 03/15/2022   NEUTROABS 4.4 03/15/2022    ASSESSMENT & PLAN:  No problem-specific Assessment & Plan notes found for this encounter.    No orders of the defined types were placed in this encounter.  The patient has a good understanding of the overall plan. she agrees with it. she will call with any problems that may develop before the next visit here. Total time spent: 30 mins including face to face time and time spent for planning, charting and co-ordination of care   Sherlyn Lick, CMA 06/26/23    I Janan Ridge am acting as a Neurosurgeon for [employer]  ***

## 2023-07-03 ENCOUNTER — Inpatient Hospital Stay: Payer: 59 | Attending: Hematology and Oncology | Admitting: Hematology and Oncology

## 2023-07-03 ENCOUNTER — Other Ambulatory Visit: Payer: Self-pay

## 2023-07-03 VITALS — BP 112/76 | HR 69 | Temp 97.7°F | Resp 19 | Wt 115.4 lb

## 2023-07-03 DIAGNOSIS — Z923 Personal history of irradiation: Secondary | ICD-10-CM | POA: Insufficient documentation

## 2023-07-03 DIAGNOSIS — Z79811 Long term (current) use of aromatase inhibitors: Secondary | ICD-10-CM | POA: Insufficient documentation

## 2023-07-03 DIAGNOSIS — Z79899 Other long term (current) drug therapy: Secondary | ICD-10-CM | POA: Diagnosis not present

## 2023-07-03 DIAGNOSIS — Z9221 Personal history of antineoplastic chemotherapy: Secondary | ICD-10-CM | POA: Insufficient documentation

## 2023-07-03 DIAGNOSIS — C50211 Malignant neoplasm of upper-inner quadrant of right female breast: Secondary | ICD-10-CM | POA: Diagnosis not present

## 2023-07-03 DIAGNOSIS — Z7951 Long term (current) use of inhaled steroids: Secondary | ICD-10-CM | POA: Insufficient documentation

## 2023-07-03 DIAGNOSIS — Z7982 Long term (current) use of aspirin: Secondary | ICD-10-CM | POA: Insufficient documentation

## 2023-07-03 DIAGNOSIS — Z170 Estrogen receptor positive status [ER+]: Secondary | ICD-10-CM | POA: Diagnosis not present

## 2023-07-03 NOTE — Assessment & Plan Note (Addendum)
Patient palpated a right breast lump x57yr and a left breast lump x53yr. Diagnostic mammogram and US showed in the right breast, a 2.7cm mass 5cm from the nipple and 0.8cm mass 1cm from the nipple at the 12:30 position, with borderline cortical thickening in the right axilla, and in the left breast, a 4.0cm mass at the 11 o'clock position representing a hamartoma. 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   Treatment plan: 1. Neoadjuvant chemotherapy with [facility] Perjeta 6 cycles completed 01/18/2021 followed by Herceptin Perjeta maintenance for 1 year completed 08/30/2021 2. Followed by breast conserving surgery if possible with sentinel lymph node study 02/02/2021: Complete pathologic response, 0/4 lymph nodes negative 3. Followed by adjuvant radiation therapy 03/14/21- 04/08/21 4.  Followed by adjuvant antiestrogen therapy with letrozole started 06/28/2021, changed to anastrozole  ------------------------------------------------------------------------------------------------------------------------------------------- Current treatment: Anastrozole    CT Neck  and Chest: 05/20/22: Benign Anastrozole Toxicities: Hot flashes (better if she drinks less coffee)   Breast cancer surveillance: Mammograms: 03/12/2023: Benign, breast density category C Breast exam 07/03/2023: Benign (redness beneath the right breast appears to be inflammation: Patient has triamcinolone ointment encouraged her to use it on the skin)  Scalp nodules: I sent a referral to dermatology.  Return to clinic in 1 year for follow-up

## 2023-07-17 ENCOUNTER — Other Ambulatory Visit (HOSPITAL_COMMUNITY): Payer: Self-pay

## 2023-07-17 ENCOUNTER — Other Ambulatory Visit: Payer: Self-pay | Admitting: Pulmonary Disease

## 2023-07-17 ENCOUNTER — Other Ambulatory Visit: Payer: Self-pay | Admitting: Physical Medicine and Rehabilitation

## 2023-07-17 DIAGNOSIS — R0609 Other forms of dyspnea: Secondary | ICD-10-CM

## 2023-07-17 DIAGNOSIS — J432 Centrilobular emphysema: Secondary | ICD-10-CM

## 2023-07-19 ENCOUNTER — Other Ambulatory Visit (HOSPITAL_COMMUNITY): Payer: Self-pay

## 2023-07-31 ENCOUNTER — Encounter: Payer: Self-pay | Admitting: Hematology and Oncology

## 2023-07-31 ENCOUNTER — Telehealth: Payer: Self-pay

## 2023-07-31 NOTE — Telephone Encounter (Signed)
Called pt to make her aware [facility] does not accept her insurance. Referral faxed to [facility]. Pt made aware and verbalized thanks. Fax confirmation received. Pt aware and verbalized understanding

## 2023-07-31 NOTE — Progress Notes (Signed)
Referral and all supporting documents faxed to [facility] dermatology per MD. Fax confirmation received. Pt aware if she should not receive call this week to call their office Monday.

## 2023-08-07 ENCOUNTER — Encounter: Payer: Self-pay | Admitting: Physical Medicine and Rehabilitation

## 2023-08-08 ENCOUNTER — Other Ambulatory Visit (HOSPITAL_COMMUNITY): Payer: Self-pay

## 2023-08-09 ENCOUNTER — Encounter (INDEPENDENT_AMBULATORY_CARE_PROVIDER_SITE_OTHER): Payer: Self-pay

## 2023-08-14 ENCOUNTER — Ambulatory Visit: Payer: 59 | Admitting: Obstetrics and Gynecology

## 2023-08-14 NOTE — Progress Notes (Deleted)
   Subjective:     Amanda Rich is a 57 y.o. female here at [facility] for a routine exam.  Current complaints: ***.  Personal health questionnaire reviewed: {yes/no:9010}.  Do you have a primary care provider? *** Do you feel safe at home? ***  Flowsheet Row Clinical Support from 06/13/2023 in [facility]  PHQ-2 Total Score 0       Health Maintenance Due  Topic Date Due   Zoster Vaccines- Shingrix (1 of 2) Never done   COVID-19 Vaccine (6 - 2023-24 season) 03/28/2023   Lung Cancer Screening  05/19/2023   INFLUENZA VACCINE  07/26/2023     Risk factors for chronic health problems: Smoking: Alchohol/how much: Pt BMI: There is no height or weight on file to calculate BMI.   Gynecologic History Patient's last menstrual period was 10/15/2017 (approximate). Contraception: {method:5051} Last Pap: ***. Results were: {norm/abn:16337} Last mammogram: ***. Results were: {norm/abn:16337}  Obstetric History OB History  No obstetric history on file.     {Common ambulatory SmartLinks:19316}  Review of Systems {ros; complete:30496}    Objective:  LMP 10/15/2017 (Approximate)   VS reviewed, nursing note reviewed,  Constitutional: well developed, well nourished, no distress HEENT: normocephalic, thyroid without enlargement or mass HEART: RRR, no murmurs rubs/gallops RESP: clear and equal to auscultation bilaterally in all lobes  Breast Exam:  ***Deferred with low risks and shared decision making, discussed recommendation to start mammogram between 40-55 yo/ exam performed: right breast normal without mass, skin or nipple changes or axillary nodes, left breast normal without mass, skin or nipple changes or axillary nodes Abdomen: soft Neuro: alert and oriented x 3 Skin: warm, dry Psych: affect normal Pelvic exam: ***Deferred/ Performed: Cervix pink, visually closed, without lesion, scant white creamy discharge, vaginal walls and external  genitalia normal Bimanual exam: Cervix 0/long/high, firm, anterior, neg CMT, uterus nontender, nonenlarged, adnexa without tenderness, enlargement, or mass        Assessment/Plan:   There are no diagnoses linked to this encounter.     No follow-ups on file.   Joanne Gavel, MD 10:26 AM

## 2023-08-28 ENCOUNTER — Ambulatory Visit: Payer: 59 | Admitting: Family Medicine

## 2023-08-30 ENCOUNTER — Encounter: Payer: Self-pay | Admitting: Family Medicine

## 2023-09-11 ENCOUNTER — Other Ambulatory Visit (HOSPITAL_COMMUNITY): Payer: Self-pay

## 2023-09-11 ENCOUNTER — Other Ambulatory Visit: Payer: Self-pay

## 2023-09-11 ENCOUNTER — Encounter: Payer: Self-pay | Admitting: Nurse Practitioner

## 2023-09-11 ENCOUNTER — Ambulatory Visit (INDEPENDENT_AMBULATORY_CARE_PROVIDER_SITE_OTHER): Payer: 59 | Admitting: Nurse Practitioner

## 2023-09-11 VITALS — BP 116/74 | HR 69 | Ht 65.0 in | Wt 117.2 lb

## 2023-09-11 DIAGNOSIS — J4489 Other specified chronic obstructive pulmonary disease: Secondary | ICD-10-CM | POA: Insufficient documentation

## 2023-09-11 DIAGNOSIS — F17210 Nicotine dependence, cigarettes, uncomplicated: Secondary | ICD-10-CM | POA: Diagnosis not present

## 2023-09-11 DIAGNOSIS — F17200 Nicotine dependence, unspecified, uncomplicated: Secondary | ICD-10-CM

## 2023-09-11 MED ORDER — SPIRIVA RESPIMAT 2.5 MCG/ACT IN AERS
2.0000 | INHALATION_SPRAY | Freq: Every day | RESPIRATORY_TRACT | 6 refills | Status: DC
  Filled 2023-09-11: qty 4, 30d supply, fill #0
  Filled 2023-10-01 – 2023-10-11 (×2): qty 4, 30d supply, fill #1
  Filled 2023-11-07: qty 4, 30d supply, fill #2
  Filled 2023-12-28: qty 4, 30d supply, fill #3

## 2023-09-11 NOTE — Patient Instructions (Signed)
Continue Albuterol inhaler 2 puffs every 6 hours as needed for shortness of breath or wheezing. Notify if symptoms persist despite rescue inhaler/neb use.  Continue Symbicort 2 puffs Twice daily. Brush tongue and rinse mouth afterwards Restart Spiriva 2 puffs daily  Work on quitting smoking with nicotine patches again   Referral to lung cancer screening program  Follow up in 4 months with Dr. Judeth Horn or Katie Armstrong Creasy,NP. If symptoms do not improve or worsen, please contact office for sooner follow up or seek emergency care.

## 2023-09-11 NOTE — Assessment & Plan Note (Addendum)
The patient's current tobacco use: 1 ppd The patient was advised to quit and impact of smoking on their health.  I assessed the patient's willingness to attempt to quit. I provided methods and skills for cessation. We reviewed medication management of smoking session drugs if appropriate. Resources to help quit smoking were provided. A smoking cessation quit date was set: 10/11/2023 Follow-up was arranged in our clinic.  The amount of time spent counseling patient was 5 mins   25 pack year hx. Referred to lung cancer screening program.

## 2023-09-11 NOTE — Progress Notes (Signed)
@Patient  ID: Amanda Rich, female    DOB: March 09, 1966, 57 y.o.   MRN: [id number]  Chief Complaint  Patient presents with   Follow-up    Pt states she has been doing the same. Not using sprivia needs refills, Pt still on symbicort.     Referring provider: Avanell Shackleton, NP-C  HPI: 57 year old female, active smoker followed for COPD/asthma and DOE.  She is patient Dr. Laurena Spies and last seen in office 06/19/2022.  Past medical history significant for pericarditis, carotid aneurysm, cerebral aneurysm, chemotherapy-induced neuropathy, stage IIa right breast cancer, HLD, depression.  TEST/EVENTS:  07/07/2021 PFT: FVC 76, FEV1 55, ratio 58, TLC 121, DLCO 67.  Moderate obstructive airway disease with reversibility (27% change).  Inflated lung volumes, indicative of air trapping and moderate diffusing defect 07/08/2021 echo: EF 60 to 65%.  RV size and function normal.  Normal PASP.  No valvular abnormalities. 05/18/2022 CT chest with contrast: 5 mm hypodensity in right lobe of thyroid.  Mild emphysematous changes.  Subpleural fibrotic changes in the anterior aspect of the right upper lobe, possible sequela of radiation therapy.  Postsurgical changes in right breast.  Mild gastric wall thickening.  06/19/2022: OV with Dr. Judeth Horn.  Symptoms felt to be related to asthma.  Most recent oncology note reviewed.  Discharge summary following embolization of right internal carotid artery.  Breathing has improved but slightly worse over the last month or 2.  Continues high-dose Symbicort and Spiriva.  Restarted smoking again due to stress of the aneurysm.  Smoking up to a pack a day.  Successful in the past with patches.  Advised to slowly increase physical activity to help with deconditioning.  Continued current inhaler regimen.  Smoking cessation advised.  09/11/2023: Today - follow up Patient presents today for overdue follow-up.  She tells me that she has been relatively stable over the last year or so.   She has not any exacerbation to running steroids or antibiotics.  She did run out of her Spiriva a little over a month ago.  She feels like since she has been off of this, her breathing feels a little more short winded with certain activities.  She has an occasional cough; clear phlegm. Some wheezing if she does a lot of activity. Gets short of breath with distance walking and stairs.  Able to complete ADLs and activities around the house without much difficulty.  Rarely uses her rescue inhaler.  Denies any fevers, chills, chest congestion, hemoptysis, lower extremity swelling, orthopnea, palpitations.  Still using her Symbicort twice a day.  She is smoking again; up to 1 pack/day.  Wants to quit again.  She has used nicotine patches in the past successfully.   Allergies  Allergen Reactions   Clarithromycin Swelling    Biaxin   Lyrica [Pregabalin]     Oversedated    Tape     Rash from Tegaderm   Ivp Dye [Iodinated Contrast Media] Rash    Immunization History  Administered Date(s) Administered   COVID-19, mRNA, vaccine(Comirnaty)12 years and older 01/31/2023   Influenza,inj,Quad PF,6+ Mos 10/25/2018, 10/04/2020, 01/31/2023   PFIZER Comirnaty(Gray Top)Covid-19 Tri-Sucrose Vaccine 06/28/2021   PFIZER(Purple Top)SARS-COV-2 Vaccination 03/25/2020, 04/19/2020   Pfizer Covid-19 Vaccine Bivalent Booster 27yrs & up 12/08/2021   Tdap 09/22/2016    Past Medical History:  Diagnosis Date   Anemia    Asthma    Cancer (HCC)    breast cancer   Depression    Dyspnea    Family history of breast  cancer    Family history of liver cancer    History of radiation therapy 03/10/21-04/11/21   Right breast- Dr. Antony Blackbird   Pericarditis    Personal history of chemotherapy    Personal history of radiation therapy    Smoker    Umbilical hernia     Tobacco History: Social History   Tobacco Use  Smoking Status Every Day   Current packs/day: 1.00   Average packs/day: 1 pack/day for 25.0 years  (25.0 ttl pk-yrs)   Types: Cigarettes  Smokeless Tobacco Never  Tobacco Comments   Using Nicotine Patch 03/06/22   Ready to quit: Not Answered Counseling given: Not Answered Tobacco comments: Using Nicotine Patch 03/06/22   Outpatient Medications Prior to Visit  Medication Sig Dispense Refill   albuterol (PROAIR HFA) 108 (90 Base) MCG/ACT inhaler Inhale 2 puffs into the lungs every 6 (six) hours as needed for wheezing or shortness of breath. 18 g 0   anastrozole (ARIMIDEX) 1 MG tablet Take 1 tablet (1 mg total) by mouth daily. 90 tablet 3   aspirin 81 MG chewable tablet Chew 1 tablet (81 mg total) by mouth daily. 30 tablet 11   budesonide-formoterol (SYMBICORT) 80-4.5 MCG/ACT inhaler Inhale 2 puffs into the lungs in the morning and at bedtime. 10.2 g 0   Calcium-Vitamin D-Vitamin K (VIACTIV PO) Take 1 tablet by mouth daily.     Cholecalciferol 25 MCG (1000 UT) tablet Take 2 tablets (2,000 Units total) by mouth daily. 90 tablet 0   citalopram (CELEXA) 10 MG tablet Take 1 tablet (10 mg total) by mouth daily. 90 tablet 3   gabapentin (NEURONTIN) 300 MG capsule Take 1 capsule (300 mg total) by mouth 2 (two) times daily. 180 capsule 3   loratadine (CLARITIN) 10 MG tablet Take 1 tablet (10 mg total) by mouth daily. 30 tablet 0   magnesium oxide (MAG-OX) 400 (240 Mg) MG tablet Take 1 tablet (400 mg total) by mouth at bedtime. 30 tablet 11   nicotine (NICODERM CQ - DOSED IN MG/24 HOURS) 21 mg/24hr patch Place 1 patch (21 mg total) onto the skin daily. 28 patch 2   triamcinolone (KENALOG) 0.025 % ointment Apply 1 Application topically 2 (two) times daily. 30 g 0   Tiotropium Bromide Monohydrate (SPIRIVA RESPIMAT) 2.5 MCG/ACT AERS Inhale 2 puffs into the lungs daily. (Patient not taking: Reported on 09/11/2023) 4 g 6   No facility-administered medications prior to visit.     Review of Systems:   Constitutional: No weight loss or gain, night sweats, fevers, chills, fatigue, or lassitude. HEENT:  No headaches, difficulty swallowing, tooth/dental problems, or sore throat. No sneezing, itching, ear ache, nasal congestion, or post nasal drip CV:  No chest pain, orthopnea, PND, swelling in lower extremities, anasarca, dizziness, palpitations, syncope Resp: +shortness of breath with exertion; occasional wheeze; chronic cough with clear phlegm. No excess mucus or change in color of mucus.  No hemoptysis. No chest wall deformity GI:  No heartburn, indigestion, abdominal pain, nausea, vomiting, diarrhea, change in bowel habits, loss of appetite, bloody stools.  Skin: No rash, lesions, ulcerations MSK:  No joint pain or swelling.   Neuro: No dizziness or lightheadedness.  Psych: No depression or anxiety. Mood stable.     Physical Exam:  BP 116/74   Pulse 69   Ht 5\' 5"  (1.651 m)   Wt 117 lb 3.2 oz (53.2 kg)   LMP 10/15/2017 (Approximate)   SpO2 100%   BMI 19.50 kg/m   GEN:  Pleasant, interactive, well-appearing; in no acute distress HEENT:  Normocephalic and atraumatic. PERRLA. Sclera white. Nasal turbinates pink, moist and patent bilaterally. No rhinorrhea present. Oropharynx pink and moist, without exudate or edema. No lesions, ulcerations, or postnasal drip.  NECK:  Supple w/ fair ROM. No JVD present. Normal carotid impulses w/o bruits. Thyroid symmetrical with no goiter or nodules palpated. No lymphadenopathy.   CV: RRR, no m/r/g, no peripheral edema. Pulses intact, +2 bilaterally. No cyanosis, pallor or clubbing. PULMONARY:  Unlabored, regular breathing. Clear bilaterally A&P w/o wheezes/rales/rhonchi. No accessory muscle use.  GI: BS present and normoactive. Soft, non-tender to palpation. No organomegaly or masses detected.  MSK: No erythema, warmth or tenderness. Cap refil <2 sec all extrem. No deformities or joint swelling noted.  Neuro: A/Ox3. No focal deficits noted.   Skin: Warm, no lesions or rashe Psych: Normal affect and behavior. Judgement and thought content appropriate.      Lab Results:  CBC    Component Value Date/Time   WBC 7.8 03/15/2022 0530   RBC 3.69 (L) 03/15/2022 0530   HGB 13.3 11/02/2022 0806   HGB 12.4 10/27/2021 1447   HCT 39.0 11/02/2022 0806   PLT 246 03/15/2022 0530   PLT 200 10/27/2021 1447   MCV 86.4 03/15/2022 0530   MCH 29.0 03/15/2022 0530   MCHC 33.5 03/15/2022 0530   RDW 13.6 03/15/2022 0530   LYMPHSABS 2.5 03/15/2022 0530   MONOABS 0.8 03/15/2022 0530   EOSABS 0.0 03/15/2022 0530   BASOSABS 0.0 03/15/2022 0530    BMET    Component Value Date/Time   NA 142 11/02/2022 0806   K 3.6 11/02/2022 0806   CL 106 11/02/2022 0806   CO2 26 03/15/2022 0530   GLUCOSE 168 (H) 11/02/2022 0806   BUN 15 11/02/2022 0806   CREATININE 0.50 11/02/2022 0806   CREATININE 0.76 10/27/2021 1447   CREATININE 0.68 09/24/2017 0827   CALCIUM 9.0 03/15/2022 0530   GFRNONAA >60 03/15/2022 0530   GFRNONAA >60 10/27/2021 1447   GFRNONAA >89 09/22/2016 1501   GFRAA >60 09/20/2020 1415   GFRAA >89 09/22/2016 1501    BNP    Component Value Date/Time   BNP 44.2 12/26/2018 0646     Imaging:  No results found.  Administration History     None          Latest Ref Rng & Units 07/07/2021   10:46 AM  PFT Results  FVC-Pre L 2.27   FVC-Predicted Pre % 76   FVC-Post L 2.89   FVC-Predicted Post % 97   Pre FEV1/FVC % % 58   Post FEV1/FCV % % 58   FEV1-Pre L 1.32   FEV1-Predicted Pre % 55   FEV1-Post L 1.68   DLCO uncorrected ml/min/mmHg 13.85   DLCO UNC% % 64   DLCO corrected ml/min/mmHg 14.57   DLCO COR %Predicted % 67   DLVA Predicted % 65   TLC L 6.30   TLC % Predicted % 121   RV % Predicted % 173     No results found for: "NITRICOXIDE"      Assessment & Plan:   COPD with asthma COPD with asthma. Moderate obstructive disease. Slight increase in dyspnea without LAMA therapy. No acute exacerbation. We will restart her Spiriva - rx sent. Understands proper use/medication side effect profile. Continue Symbicort.  Smoking cessation strongly advised. Graded exercise encouraged. Action plan in place.  Patient Instructions  Continue Albuterol inhaler 2 puffs every 6 hours as needed for shortness  of breath or wheezing. Notify if symptoms persist despite rescue inhaler/neb use.  Continue Symbicort 2 puffs Twice daily. Brush tongue and rinse mouth afterwards Restart Spiriva 2 puffs daily  Work on quitting smoking with nicotine patches again   Referral to lung cancer screening program  Follow up in 4 months with Dr. Judeth Horn or Katie Camira Geidel,NP. If symptoms do not improve or worsen, please contact office for sooner follow up or seek emergency care.    Cigarette smoker The patient's current tobacco use: 1 ppd The patient was advised to quit and impact of smoking on their health.  I assessed the patient's willingness to attempt to quit. I provided methods and skills for cessation. We reviewed medication management of smoking session drugs if appropriate. Resources to help quit smoking were provided. A smoking cessation quit date was set: 10/11/2023 Follow-up was arranged in our clinic.  The amount of time spent counseling patient was 5 mins   25 pack year hx. Referred to lung cancer screening program.   I spent 31 minutes of dedicated to the care of this patient on the date of this encounter to include pre-visit review of records, face-to-face time with the patient discussing conditions above, post visit ordering of testing, clinical documentation with the electronic health record, making appropriate referrals as documented, and communicating necessary findings to members of the patients care team.  Noemi Chapel, NP 09/11/2023  Pt aware and understands NP's role.

## 2023-09-11 NOTE — Assessment & Plan Note (Signed)
COPD with asthma. Moderate obstructive disease. Slight increase in dyspnea without LAMA therapy. No acute exacerbation. We will restart her Spiriva - rx sent. Understands proper use/medication side effect profile. Continue Symbicort. Smoking cessation strongly advised. Graded exercise encouraged. Action plan in place.  Patient Instructions  Continue Albuterol inhaler 2 puffs every 6 hours as needed for shortness of breath or wheezing. Notify if symptoms persist despite rescue inhaler/neb use.  Continue Symbicort 2 puffs Twice daily. Brush tongue and rinse mouth afterwards Restart Spiriva 2 puffs daily  Work on quitting smoking with nicotine patches again   Referral to lung cancer screening program  Follow up in 4 months with Dr. Judeth Horn or Katie Cesare Sumlin,NP. If symptoms do not improve or worsen, please contact office for sooner follow up or seek emergency care.

## 2023-09-13 ENCOUNTER — Other Ambulatory Visit (HOSPITAL_COMMUNITY): Payer: Self-pay

## 2023-09-17 ENCOUNTER — Other Ambulatory Visit (HOSPITAL_COMMUNITY): Payer: Self-pay

## 2023-09-17 ENCOUNTER — Other Ambulatory Visit: Payer: Self-pay

## 2023-09-17 DIAGNOSIS — D492 Neoplasm of unspecified behavior of bone, soft tissue, and skin: Secondary | ICD-10-CM | POA: Diagnosis not present

## 2023-09-17 DIAGNOSIS — L814 Other melanin hyperpigmentation: Secondary | ICD-10-CM | POA: Diagnosis not present

## 2023-09-17 DIAGNOSIS — D225 Melanocytic nevi of trunk: Secondary | ICD-10-CM | POA: Diagnosis not present

## 2023-09-17 DIAGNOSIS — Z7189 Other specified counseling: Secondary | ICD-10-CM | POA: Diagnosis not present

## 2023-09-17 DIAGNOSIS — L821 Other seborrheic keratosis: Secondary | ICD-10-CM | POA: Diagnosis not present

## 2023-09-17 DIAGNOSIS — L309 Dermatitis, unspecified: Secondary | ICD-10-CM | POA: Diagnosis not present

## 2023-09-17 MED ORDER — TRIAMCINOLONE ACETONIDE 0.1 % EX CREA
1.0000 | TOPICAL_CREAM | Freq: Two times a day (BID) | CUTANEOUS | 2 refills | Status: AC
  Filled 2023-09-17 (×2): qty 454, 90d supply, fill #0

## 2023-10-01 ENCOUNTER — Other Ambulatory Visit: Payer: Self-pay

## 2023-10-01 ENCOUNTER — Other Ambulatory Visit (HOSPITAL_COMMUNITY): Payer: Self-pay

## 2023-10-01 ENCOUNTER — Other Ambulatory Visit: Payer: Self-pay | Admitting: Hematology and Oncology

## 2023-10-01 MED ORDER — ANASTROZOLE 1 MG PO TABS
1.0000 mg | ORAL_TABLET | Freq: Every day | ORAL | 3 refills | Status: DC
  Filled 2023-10-01: qty 90, 90d supply, fill #0
  Filled 2023-12-28: qty 90, 90d supply, fill #1
  Filled 2024-03-18: qty 90, 90d supply, fill #2
  Filled 2024-07-01: qty 90, 90d supply, fill #3

## 2023-10-02 DIAGNOSIS — I639 Cerebral infarction, unspecified: Secondary | ICD-10-CM | POA: Diagnosis not present

## 2023-10-02 DIAGNOSIS — H04123 Dry eye syndrome of bilateral lacrimal glands: Secondary | ICD-10-CM | POA: Diagnosis not present

## 2023-10-02 DIAGNOSIS — H2513 Age-related nuclear cataract, bilateral: Secondary | ICD-10-CM | POA: Diagnosis not present

## 2023-10-02 DIAGNOSIS — H1045 Other chronic allergic conjunctivitis: Secondary | ICD-10-CM | POA: Diagnosis not present

## 2023-10-09 ENCOUNTER — Ambulatory Visit: Payer: 59 | Admitting: Obstetrics and Gynecology

## 2023-10-09 ENCOUNTER — Encounter: Payer: Self-pay | Admitting: Obstetrics and Gynecology

## 2023-10-09 ENCOUNTER — Other Ambulatory Visit (HOSPITAL_COMMUNITY)
Admission: RE | Admit: 2023-10-09 | Discharge: 2023-10-09 | Disposition: A | Discharge status: Home / Self Care | Payer: 59 | Source: Ambulatory Visit | Attending: Obstetrics and Gynecology | Admitting: Obstetrics and Gynecology

## 2023-10-09 VITALS — BP 111/74 | HR 74 | Ht 65.0 in | Wt 116.0 lb

## 2023-10-09 DIAGNOSIS — Z01419 Encounter for gynecological examination (general) (routine) without abnormal findings: Secondary | ICD-10-CM | POA: Diagnosis present

## 2023-10-09 NOTE — Telephone Encounter (Signed)
TC

## 2023-10-09 NOTE — Progress Notes (Signed)
Pt presents for AEX Last PAP 10/2021, requesting PAP today  Declines STD testing  Mammogram 02/2023 Colonoscopy 2019

## 2023-10-09 NOTE — Progress Notes (Signed)
ANNUAL EXAM Patient name: Amanda Rich MRN [id number]  Date of birth: 04-10-1966 Chief Complaint:   Gynecologic Exam  History of Present Illness:   Amanda Rich is a 57 y.o. [id number] African-American female being seen today for a routine annual exam.   Current complaints: No concerns. Diagnosed with breast cancer in 2021. Underwent chemo, radiation, and lumpectomy. Has been cancer free since. Wondering if she can become sexually active again. Does note some vaginal dryness. Is on anastrozole. Postmenopausal.  Last pap 11/22 on file. Pt believes she had one after that as well. Results were: NILM w/ HRHPV negative. H/O abnormal pap: no Last mammogram: 3/24. Results were: normal post-lumpectomy. Continues to follow with oncology Last colonoscopy: '19. Results were: normal. Family h/o colorectal cancer: no     06/13/2023    1:41 PM 05/25/2023    1:12 PM 03/02/2023    8:13 AM 12/05/2022   11:37 AM 08/29/2022   11:20 AM  Depression screen PHQ 2/9  Decreased Interest 0 1 0 1 2  Down, Depressed, Hopeless 0 0 0 1 1  PHQ - 2 Score 0 1 0 2 3  Altered sleeping 2    3  Tired, decreased energy 2    1  Change in appetite 0    1  Feeling bad or failure about yourself  0    0  Trouble concentrating 0    0  Moving slowly or fidgety/restless 0    1  Suicidal thoughts 0    0  PHQ-9 Score 4    9  Difficult doing work/chores     Very difficult         No data to display           Review of Systems:   Pertinent items are noted in HPI Denies any headaches, blurred vision, fatigue, shortness of breath, chest pain, abdominal pain, abnormal vaginal discharge/itching/odor/irritation, problems with periods, bowel movements, urination, or intercourse unless otherwise stated above. Pertinent History Reviewed:  Reviewed past medical,surgical, social and family history.  Reviewed problem list, medications and allergies. Physical Assessment:   Vitals:   10/09/23 1315  BP: 111/74  Pulse: 74   Weight: 116 lb (52.6 kg)  Height: 5\' 5"  (1.651 m)  Body mass index is 19.3 kg/m.        Physical Examination:   General appearance - well appearing, and in no distress  Mental status - alert, oriented to person, place, and time  Psych:  She has a normal mood and affect  Skin - warm and dry, normal color, no suspicious lesions noted  Chest - effort normal, all lung fields clear to auscultation bilaterally  Heart - normal rate and regular rhythm  Neck:  midline trachea, no thyromegaly or nodules  Breasts - breasts appear normal, no suspicious masses, no skin or nipple changes or  axillary nodes  Abdomen - soft, nontender, nondistended, no masses or organomegaly  Pelvic -VULVA: normal appearing vulva with no masses, tenderness or lesions  VAGINA: normal appearing vagina with normal color and discharge, no lesions. Notable atrophy and dryness   CERVIX: normal appearing cervix without discharge or lesions, no CMT  Thin prep pap is done with HR HPV cotesting  UTERUS: uterus is felt to be normal size, shape, consistency and nontender   ADNEXA: No adnexal masses or tenderness noted.  Extremities:  No swelling or varicosities noted  Chaperone present for exam  No results found for this or any previous visit (from  the past 24 hour(s)).  Assessment & Plan:  1) Well-Woman Exam: declines STI screening today. Pap smear performed. Discussed ASCCP guidelines that paps can be spaced to more than one year apart. Patient elects to perform today and will consider what she prefers for screening going forward. Otherwise UTD on screenings and sees PCP regularly  2) Hx breast cancer: continues on anastrozole. Discussed that this is certainly contributing to her vaginal dryness and irritation. Discussed that vaginal estrogens are not appropriate for her, however there are other lubricants and creams that are non-hormonal. She prefers to not be sexually active at this time and is happy with her current  relationship. Congratulated on her being in remission and being UTD on screenings  Labs/procedures today: Pap  Mammogram:  Next due 3/25 , or sooner if problems Colonoscopy: per GI, or sooner if problems  No orders of the defined types were placed in this encounter.   Meds: No orders of the defined types were placed in this encounter.   Follow-up: Return in about 1 year (around 10/08/2024) for Annual.  Joanne Gavel, MD 10/09/2023 2:03 PM

## 2023-10-11 ENCOUNTER — Other Ambulatory Visit: Payer: Self-pay

## 2023-10-11 LAB — CYTOLOGY - PAP
Adequacy: ABSENT
Diagnosis: NEGATIVE

## 2023-10-12 ENCOUNTER — Other Ambulatory Visit: Payer: Self-pay | Admitting: Obstetrics and Gynecology

## 2023-10-12 ENCOUNTER — Other Ambulatory Visit (HOSPITAL_COMMUNITY): Payer: Self-pay

## 2023-10-12 ENCOUNTER — Other Ambulatory Visit: Payer: Self-pay

## 2023-10-12 MED ORDER — METRONIDAZOLE 500 MG PO TABS
500.0000 mg | ORAL_TABLET | Freq: Two times a day (BID) | ORAL | 0 refills | Status: DC
  Filled 2023-10-12 (×2): qty 14, 7d supply, fill #0

## 2023-10-16 ENCOUNTER — Other Ambulatory Visit: Payer: Self-pay | Admitting: *Deleted

## 2023-10-16 DIAGNOSIS — F17210 Nicotine dependence, cigarettes, uncomplicated: Secondary | ICD-10-CM

## 2023-10-16 DIAGNOSIS — Z87891 Personal history of nicotine dependence: Secondary | ICD-10-CM

## 2023-10-16 DIAGNOSIS — Z122 Encounter for screening for malignant neoplasm of respiratory organs: Secondary | ICD-10-CM

## 2023-10-17 ENCOUNTER — Encounter: Payer: Self-pay | Admitting: Adult Health

## 2023-10-17 ENCOUNTER — Ambulatory Visit (HOSPITAL_COMMUNITY)
Admission: RE | Admit: 2023-10-17 | Discharge: 2023-10-17 | Disposition: A | Discharge status: Home / Self Care | Payer: 59 | Source: Ambulatory Visit | Attending: Acute Care | Admitting: Acute Care

## 2023-10-17 ENCOUNTER — Ambulatory Visit (INDEPENDENT_AMBULATORY_CARE_PROVIDER_SITE_OTHER): Payer: 59 | Admitting: Adult Health

## 2023-10-17 DIAGNOSIS — Z87891 Personal history of nicotine dependence: Secondary | ICD-10-CM | POA: Diagnosis not present

## 2023-10-17 DIAGNOSIS — F17210 Nicotine dependence, cigarettes, uncomplicated: Secondary | ICD-10-CM

## 2023-10-17 DIAGNOSIS — Z122 Encounter for screening for malignant neoplasm of respiratory organs: Secondary | ICD-10-CM | POA: Insufficient documentation

## 2023-10-17 NOTE — Patient Instructions (Signed)

## 2023-10-17 NOTE — Progress Notes (Signed)
  Virtual Visit via Telephone Note  I connected with Amanda Rich , 10/17/23 8:38 AM by a telemedicine application and verified that I am speaking with the correct person using two identifiers.  Location: Patient: home Provider: home   I discussed the limitations of evaluation and management by telemedicine and the availability of in person appointments. The patient expressed understanding and agreed to proceed.   Shared Decision Making Visit Lung Cancer Screening Program [phone])   Eligibility: 57 y.o. Pack Years Smoking History Calculation =37 pack years (# packs/per year x # years smoked) Recent History of coughing up blood  no Unexplained weight loss? no ( >Than 15 pounds within the last 6 months ) Prior History Lung / other cancer - breast cancer 2021 (Diagnosis within the last 5 years already requiring surveillance chest CT Scans). Smoking Status Current Smoker  Visit Components: Discussion included one or more decision making aids. YES Discussion included risk/benefits of screening. YES Discussion included potential follow up diagnostic testing for abnormal scans. YES Discussion included meaning and risk of over diagnosis. YES Discussion included meaning and risk of False Positives. YES Discussion included meaning of total radiation exposure. YES  Counseling Included: Importance of adherence to annual lung cancer LDCT screening. YES Impact of comorbidities on ability to participate in the program. YES Ability and willingness to under diagnostic treatment. YES  Smoking Cessation Counseling: Current Smokers:  Discussed importance of smoking cessation. yes Information about tobacco cessation classes and interventions provided to patient. yes Patient provided with "ticket" for LDCT Scan. yes Symptomatic Patient. no Diagnosis Code: Tobacco Use Z72.0 Asymptomatic Patient yes  Counseling (Intermediate counseling: > three minutes counseling) [id number] Patient provided  with "ticket" for LDCT Scan. yes Written Order for Lung Cancer Screening with LDCT placed in Epic. Yes (CT Chest Lung Cancer Screening Low Dose W/O CM) [id number]  Z12.2-Screening of respiratory organs Z87.891-Personal history of nicotine dependence   [name] 10/17/23

## 2023-10-22 ENCOUNTER — Other Ambulatory Visit (HOSPITAL_BASED_OUTPATIENT_CLINIC_OR_DEPARTMENT_OTHER): Payer: Self-pay

## 2023-11-07 ENCOUNTER — Other Ambulatory Visit: Payer: Self-pay

## 2023-11-07 ENCOUNTER — Other Ambulatory Visit (HOSPITAL_COMMUNITY): Payer: Self-pay

## 2023-11-13 ENCOUNTER — Other Ambulatory Visit: Payer: Self-pay

## 2023-11-13 DIAGNOSIS — Z122 Encounter for screening for malignant neoplasm of respiratory organs: Secondary | ICD-10-CM

## 2023-11-13 DIAGNOSIS — F17210 Nicotine dependence, cigarettes, uncomplicated: Secondary | ICD-10-CM

## 2023-11-13 DIAGNOSIS — Z87891 Personal history of nicotine dependence: Secondary | ICD-10-CM

## 2023-12-07 ENCOUNTER — Other Ambulatory Visit (HOSPITAL_COMMUNITY): Payer: Self-pay

## 2023-12-07 ENCOUNTER — Encounter: Payer: 59 | Attending: Physical Medicine and Rehabilitation | Admitting: Physical Medicine and Rehabilitation

## 2023-12-07 VITALS — BP 103/71 | HR 83 | Ht 65.0 in | Wt 117.0 lb

## 2023-12-07 DIAGNOSIS — L989 Disorder of the skin and subcutaneous tissue, unspecified: Secondary | ICD-10-CM | POA: Diagnosis not present

## 2023-12-07 DIAGNOSIS — R4789 Other speech disturbances: Secondary | ICD-10-CM | POA: Diagnosis not present

## 2023-12-07 DIAGNOSIS — R630 Anorexia: Secondary | ICD-10-CM | POA: Insufficient documentation

## 2023-12-07 DIAGNOSIS — F32A Depression, unspecified: Secondary | ICD-10-CM | POA: Diagnosis present

## 2023-12-07 DIAGNOSIS — G4701 Insomnia due to medical condition: Secondary | ICD-10-CM | POA: Insufficient documentation

## 2023-12-07 MED ORDER — MIRTAZAPINE 7.5 MG PO TABS
7.5000 mg | ORAL_TABLET | Freq: Every day | ORAL | 3 refills | Status: DC
  Filled 2023-12-07: qty 30, 30d supply, fill #0
  Filled 2024-01-23: qty 30, 30d supply, fill #1
  Filled 2024-02-05: qty 30, 30d supply, fill #2
  Filled 2024-03-18: qty 30, 30d supply, fill #3

## 2023-12-07 NOTE — Patient Instructions (Signed)
Insomnia: -Try to go outside near sunrise -Get exercise during the day.  -Turn off all devices an hour before bedtime.  -Teas that can benefit: chamomile, valerian root, Brahmi (Bacopa) -Can consider over the counter melatonin, magnesium, and/or L-theanine. Melatonin is an anti-oxidant with multiple health benefits. Magnesium is involved in greater than 300 enzymatic reactions in the body and most of [place] are deficient as our soil is often depleted. There are 7 different types of magnesium- Bioptemizer's is a supplement with all 7 types, and each has unique benefits. Magnesium can also help with constipation and anxiety.  -Pistachios naturally increase the production of melatonin -Cozy Earth bamboo bed sheets are free from toxic chemicals.  -Tart cherry juice or a tart cherry supplement can improve sleep and soreness post-workout   Mirtazepine

## 2023-12-07 NOTE — Addendum Note (Signed)
Addended by: Horton Chin on: 12/07/2023 10:49 AM   Modules accepted: Orders

## 2023-12-07 NOTE — Progress Notes (Signed)
Subjective:    Patient ID: Amanda Rich, female    DOB: October 06, 1966, 57 y.o.   MRN: [id number]  HPI Amanda Rich is a 57 year old woman who presents for follow-up for subcortical infarction and neuropathy  1) Subcortical infarction -having bruising from the brillinta -she was not told how long she should take the brillinta for  -her disability was just approved -she used to enjoy work and has been so used to working.  -she follows with NSGY and has arteriogram scheduled October.  -she did not like her OT, she likes PT  2) decrease in appetite -lost weight from 130 lbs to 123 lbs to 117 lbs -felt better at a higher weight.   3) depression: -restarted celexa -she feels that appetite is poor due to this -has started volunteering, she has been working at [employer] -she had completely stopped celexa instead of weaning -she mises her work but does not feel she can stand that long, she feels she as nothing to do -she would like to start working out -she would like to find part time work.   4) neuropathy -she takes gabapentin twice per day -feels like she is walking on nails -she can only take gabapentin once per day because it makes her loopy  5) Insomnia: -celexa and gabapenitn did not help -sleeps at 5:30/6 in the morning   Pain Inventory Average Pain  Pain Right Now 1 My pain is sharp, stabbing, and tingling  LOCATION OF PAIN  fingers, leg, toes  BOWEL Number of stools per week: 5 plus   BLADDER Normal    Mobility  Cane Walk 15-20 mins Climbs stairs drive Function disabled: date disabled . 07/2020  Neuro/Psych confusion  Prior Studies Any changes since last visit?  yes 10/2022    Family History  Problem Relation Age of Onset   Diabetes Mother    Hypertension Mother    Liver cancer Daughter 44   Diabetes Maternal Aunt    Breast cancer Maternal Aunt 41   Colon cancer Maternal Uncle 84       or prostate cancer   Colon cancer Cousin    Breast  cancer Cousin    Autism Half-Brother    Breast cancer Other 4       maternal second cousin   Diabetes Paternal Uncle    Breast cancer Other        dx. 61s, paternal second cousin   Lung cancer Other        dx. 41s, paternal cousin once removed (father's cousin)   Social History   Socioeconomic History   Marital status: Divorced    Spouse name: Not on file   Number of children: 3   Years of education: Not on file   Highest education level: Associate degree: academic program  Occupational History   Not on file  Tobacco Use   Smoking status: Every Day    Current packs/day: 1.00    Average packs/day: 1 pack/day for 25.0 years (25.0 ttl pk-yrs)    Types: Cigarettes   Smokeless tobacco: Never   Tobacco comments:    Using Nicotine Patch 03/06/22  Vaping Use   Vaping status: Never Used  Substance and Sexual Activity   Alcohol use: Not Currently    Comment: rarely   Drug use: No   Sexual activity: Not Currently    Birth control/protection: Post-menopausal  Other Topics Concern   Not on file  Social History Narrative   Not on file  Social Drivers of Corporate investment banker Strain: Low Risk  (06/13/2023)   Overall Financial Resource Strain (CARDIA)    Difficulty of Paying Living Expenses: Not hard at all  Food Insecurity: No Food Insecurity (06/13/2023)   Hunger Vital Sign    Worried About Running Out of Food in the Last Year: Never true    Ran Out of Food in the Last Year: Never true  Transportation Needs: No Transportation Needs (06/13/2023)   PRAPARE - Administrator, Civil Service (Medical): No    Lack of Transportation (Non-Medical): No  Physical Activity: Sufficiently Active (06/13/2023)   Exercise Vital Sign    Days of Exercise per Week: 5 days    Minutes of Exercise per Session: 30 min  Stress: No Stress Concern Present (06/13/2023)   [employer] of Occupational Health - Occupational Stress Questionnaire    Feeling of Stress : Not at all   Social Connections: Socially Integrated (06/13/2023)   Social Connection and Isolation Panel [NHANES]    Frequency of Communication with Friends and Family: More than three times a week    Frequency of Social Gatherings with Friends and Family: More than three times a week    Attends Religious Services: More than 4 times per year    Active Member of Clubs or Organizations: Yes    Attends Engineer, structural: More than 4 times per year    Marital Status: Married   Past Surgical History:  Procedure Laterality Date   ABLATION ON ENDOMETRIOSIS     BREAST BIOPSY Right    BREAST LUMPECTOMY Right 02/02/2021   BREAST LUMPECTOMY WITH RADIOACTIVE SEED AND SENTINEL LYMPH NODE BIOPSY Right 02/02/2021   Procedure: RIGHT BREAST LUMPECTOMY WITH RADIOACTIVE SEED AND RIGHT SENTINEL LYMPH NODE MAPPING;  Surgeon: Harriette Bouillon, MD;  Location: [facility];  Service: General;  Laterality: Right;   GANGLION CYST EXCISION  12/25/2005   L wrist; [facility], [place]   IR ANGIO INTRA EXTRACRAN SEL INTERNAL CAROTID BILAT MOD SED  02/06/2022   IR ANGIO INTRA EXTRACRAN SEL INTERNAL CAROTID UNI R MOD SED  03/10/2022   IR ANGIO INTRA EXTRACRAN SEL INTERNAL CAROTID UNI R MOD SED  11/02/2022   IR ANGIO VERTEBRAL SEL VERTEBRAL BILAT MOD SED  02/06/2022   IR ANGIOGRAM FOLLOW UP STUDY  03/14/2022   IR IMAGING GUIDED PORT INSERTION  09/10/2020   IR NEURO EACH ADD'L AFTER BASIC UNI RIGHT (MS)  03/14/2022   IR TRANSCATH/EMBOLIZ  03/10/2022   RADIOLOGY WITH ANESTHESIA N/A 03/10/2022   Procedure: 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 Antony Blackbird   Pericarditis    Personal history of chemotherapy    Personal history of radiation therapy    Smoker    Umbilical hernia    LMP 10/15/2017 (Approximate)   Opioid Risk Score:   Fall Risk Score:  `1  Depression screen [facility] 2/9     10/09/2023  4:57 PM 06/13/2023    1:41 PM 05/25/2023    1:12 PM 03/02/2023    8:13 AM 12/05/2022   11:37 AM 08/29/2022   11:20 AM 11/07/2021   11:02 AM  Depression screen PHQ 2/9  Decreased Interest 0 0 1 0 1 2 0  Down, Depressed, Hopeless 0 0 0 0 1 1 2   PHQ - 2 Score 0 0 1 0 2 3 2   Altered sleeping 3 2    3 3   Tired, decreased energy 3 2    1 3   Change in appetite 0 0    1 0  Feeling bad or failure about yourself  0 0    0 0  Trouble concentrating 0 0    0 1  Moving slowly or fidgety/restless 0 0    1 0  Suicidal thoughts 0 0    0 0  PHQ-9 Score 6 4    9 9   Difficult doing work/chores      Very difficult Not difficult at all      Review of Systems  Constitutional:  Positive for appetite change.  Respiratory:  Positive for shortness of breath.   Skin:        Random bruising  All other systems reviewed and are negative.      Objective:   Physical Exam Gen: no distress, normal appearing, weight 115 lbs, BMI 19.17 HEENT: oral mucosa pink and moist, NCAT Cardio: Reg rate Chest: normal effort, normal rate of breathing Abd: soft, non-distended Ext: no edema Psych: pleasant, normal affect Skin: intact Neuro: Alert and oriented x3, left side strength 4/5, 5/5 in right, improved memory MSK: improved ambulation       Assessment & Plan:   1) CVA -continue HEP -would benefit from handicap placard to increase mobility in the community -reviewed all medications and provided necessary refills. -continue use of cane -discussed choosing anti-inflammatory foods -recommended decreasing sugar as much as possible.   2) Bruising -discussed that this  could be from the [place], recommended following up with surgeon regarding duraiton of Brillinta  3) Brain fog -encouraged continued exercise -encouraged walnuts, blueberries, salmons -can consider Cefazolin NAC -discussed that more noticeable when she is tired  4) Insomnia: -discussed mirtazepine 7.5mg  at night -continue vitamin D 2,000 -continue magnesium -Try to go outside near sunrise -Get exercise during the day.  -Turn off all devices an hour before bedtime.  -Teas that can benefit: chamomile, valerian root, Brahmi (Bacopa) -Can consider over the counter melatonin, magnesium, and/or L-theanine. Melatonin is an anti-oxidant with multiple health benefits. Magnesium is involved in greater than 300 enzymatic reactions in the body and most of [place] are deficient as our soil is often depleted. There are 7 different types of magnesium- Bioptemizer's is a supplement with all 7 types, and each has unique benefits. Magnesium can also help with constipation and anxiety.  -Pistachios naturally increase the production of melatonin -Cozy Earth bamboo bed sheets are free from toxic chemicals.  -Tart cherry juice or a tart cherry supplement can improve sleep and soreness post-workout     5) Neuropathy Prescribed Zynex Nexwave  Turmeric to reduce inflammation--can be used in cooking or taken as a supplement.  Benefits of turmeric:  -Highly anti-inflammatory  -Increases antioxidants  -Improves memory, attention, brain disease  -Lowers risk of heart disease  -May help prevent cancer  -Decreases pain  -Alleviates depression  -Delays aging and decreases risk of chronic disease  -Consume with black pepper to increase absorption  Turmeric Milk Recipe:  1 cup milk  1 tsp turmeric  1 tsp cinnamon  1 tsp grated ginger (optional)  Black pepper (boosts the anti-inflammatory properties of turmeric).  1 tsp honey  -Discussed current symptoms of pain and history of pain.   -Discussed benefits of exercise in reducing pain. -Discussed following foods that may reduce pain: 1) Ginger (especially studied for arthritis)- reduce leukotriene production to decrease inflammation 2) Blueberries- high in phytonutrients that decrease inflammation 3) Salmon- marine omega-3s reduce joint swelling and pain 4) Pumpkin seeds- reduce inflammation 5) dark chocolate- reduces inflammation 6) turmeric- reduces inflammation 7) tart cherries - reduce pain and stiffness 8) extra virgin olive oil - its compound olecanthal helps to block prostaglandins  9) chili peppers- can be eaten or applied topically via capsaicin 10) mint- helpful for headache, muscle aches, joint pain, and itching 11) garlic- reduces inflammation  Link to further information on diet for chronic pain: [email or web address]   6) Word finding difficulty: -recommended avoiding activities that cause fatigue -discussed she is doing better with this  7) Skin lesion:  -discussed that she followed up with dermatology  8) Depression: -discussed mirtazepine 7.5mg  HS -discussed benefits of depression, meditation, massage -recommended methylated B vitamin   9) Decreased appetite:  -discussed mirtazepine 7.5mg  HS

## 2023-12-08 ENCOUNTER — Other Ambulatory Visit (HOSPITAL_COMMUNITY): Payer: Self-pay

## 2023-12-28 ENCOUNTER — Other Ambulatory Visit: Payer: Self-pay

## 2024-01-11 ENCOUNTER — Encounter: Payer: Self-pay | Admitting: Nurse Practitioner

## 2024-01-11 ENCOUNTER — Ambulatory Visit: Payer: 59 | Admitting: Nurse Practitioner

## 2024-01-11 ENCOUNTER — Other Ambulatory Visit: Payer: Self-pay

## 2024-01-11 ENCOUNTER — Other Ambulatory Visit (HOSPITAL_COMMUNITY): Payer: Self-pay

## 2024-01-11 VITALS — BP 100/80 | HR 63 | Ht 65.0 in | Wt 115.5 lb

## 2024-01-11 DIAGNOSIS — J4489 Other specified chronic obstructive pulmonary disease: Secondary | ICD-10-CM | POA: Diagnosis not present

## 2024-01-11 DIAGNOSIS — J439 Emphysema, unspecified: Secondary | ICD-10-CM | POA: Diagnosis not present

## 2024-01-11 DIAGNOSIS — F17210 Nicotine dependence, cigarettes, uncomplicated: Secondary | ICD-10-CM

## 2024-01-11 MED ORDER — ALBUTEROL SULFATE HFA 108 (90 BASE) MCG/ACT IN AERS
2.0000 | INHALATION_SPRAY | Freq: Four times a day (QID) | RESPIRATORY_TRACT | 4 refills | Status: AC | PRN
  Filled 2024-01-11: qty 18, 25d supply, fill #0
  Filled 2024-03-06: qty 18, 25d supply, fill #1
  Filled 2024-04-28: qty 6.7, 25d supply, fill #2
  Filled 2024-07-01: qty 6.7, 25d supply, fill #3
  Filled 2024-10-28: qty 18, 25d supply, fill #4

## 2024-01-11 MED ORDER — BUDESONIDE-FORMOTEROL FUMARATE 160-4.5 MCG/ACT IN AERO
2.0000 | INHALATION_SPRAY | Freq: Two times a day (BID) | RESPIRATORY_TRACT | 11 refills | Status: AC
  Filled 2024-01-11: qty 10.2, 30d supply, fill #0
  Filled 2024-02-05: qty 10.2, 30d supply, fill #1
  Filled 2024-03-06: qty 10.2, 30d supply, fill #2
  Filled 2024-04-14: qty 10.2, 30d supply, fill #3
  Filled 2024-05-18: qty 10.2, 30d supply, fill #4
  Filled 2024-06-12: qty 10.2, 30d supply, fill #5
  Filled 2024-07-16: qty 10.2, 30d supply, fill #6
  Filled 2024-08-11: qty 10.2, 30d supply, fill #7
  Filled 2024-09-09: qty 10.2, 30d supply, fill #8
  Filled 2024-10-28: qty 10.2, 30d supply, fill #9
  Filled 2024-12-10: qty 10.2, 30d supply, fill #10
  Filled 2025-01-04: qty 10.2, 30d supply, fill #11

## 2024-01-11 MED ORDER — SPIRIVA RESPIMAT 2.5 MCG/ACT IN AERS
2.0000 | INHALATION_SPRAY | Freq: Every day | RESPIRATORY_TRACT | 11 refills | Status: AC
  Filled 2024-01-11 – 2024-01-23 (×2): qty 4, 30d supply, fill #0
  Filled 2024-02-05: qty 4, 30d supply, fill #1
  Filled 2024-03-18: qty 4, 30d supply, fill #2
  Filled 2024-04-14: qty 4, 30d supply, fill #3
  Filled 2024-06-12: qty 4, 30d supply, fill #4
  Filled 2024-07-16: qty 4, 30d supply, fill #5
  Filled 2024-09-09: qty 4, 30d supply, fill #6
  Filled 2024-10-28: qty 4, 30d supply, fill #7
  Filled 2024-12-10: qty 4, 30d supply, fill #8
  Filled 2025-01-04: qty 4, 30d supply, fill #9

## 2024-01-11 NOTE — Patient Instructions (Addendum)
Continue Albuterol inhaler 2 puffs every 6 hours as needed for shortness of breath or wheezing. Notify if symptoms persist despite rescue inhaler/neb use.  Continue Symbicort 2 puffs Twice daily. Brush tongue and rinse mouth afterwards Continue Spiriva 2 puffs daily   Work on quitting smoking with nicotine patches again - call 1-800-QUITNOW line    Repeat CT with lung cancer screening program in October 2025. Call if you haven't heard from them regarding scheduling come September. [phone]   Follow up in 6 months with Dr. Judeth Horn or Florentina Addison Willer Osorno,NP. If symptoms worsen, please contact office for sooner follow up or seek emergency care.

## 2024-01-11 NOTE — Assessment & Plan Note (Signed)
Stable without recent exacerbations. No hospitalizations. Utilizing Symbicort more than prescribed due to running out of albuterol. Rx refilled today. Understands current maintenance regimen. Smoking cessation strongly advised. She will reach out to her insurance and the Quit Now line to see if they can help with affordability of NRT since this worked well for her previously. Encouraged to remain active. Action plan in place.  Patient Instructions  Continue Albuterol inhaler 2 puffs every 6 hours as needed for shortness of breath or wheezing. Notify if symptoms persist despite rescue inhaler/neb use.  Continue Symbicort 2 puffs Twice daily. Brush tongue and rinse mouth afterwards Continue Spiriva 2 puffs daily   Work on quitting smoking with nicotine patches again - call 1-800-QUITNOW line    Repeat CT with lung cancer screening program in October 2025. Call if you haven't heard from them regarding scheduling come September. [phone]   Follow up in 6 months with Dr. Judeth Horn or Florentina Addison Farid Grigorian,NP. If symptoms worsen, please contact office for sooner follow up or seek emergency care.

## 2024-01-11 NOTE — Progress Notes (Signed)
@Patient  ID: Amanda Rich, female    DOB: January 05, 1966, 58 y.o.   MRN: [id number]  Chief Complaint  Patient presents with   Follow-up    CT Results. Pt request Albuterol inhaler refill    Referring provider: Avanell Shackleton, NP-C  HPI: 58 year old female, active smoker followed for COPD/asthma and DOE.  She is patient Dr. Laurena Spies and last seen in 09/11/2023 by [facility] NP.  Past medical history significant for pericarditis, carotid aneurysm, cerebral aneurysm, chemotherapy-induced neuropathy, stage IIa right breast cancer, HLD, depression.  TEST/EVENTS:  07/07/2021 PFT: FVC 76, FEV1 55, ratio 58, TLC 121, DLCO 67.  Moderate obstructive airway disease with reversibility (27% change).  Inflated lung volumes, indicative of air trapping and moderate diffusing defect 07/08/2021 echo: EF 60 to 65%.  RV size and function normal.  Normal PASP.  No valvular abnormalities. 05/18/2022 CT chest with contrast: 5 mm hypodensity in right lobe of thyroid.  Mild emphysematous changes.  Subpleural fibrotic changes in the anterior aspect of the right upper lobe, possible sequela of radiation therapy.  Postsurgical changes in right breast.  Mild gastric wall thickening. 10/17/2023 LDCT lung cancer screen: atherosclerosis. Moderate centrilobular emphysema. Isolated lingular nodule or mucoid impaction at 3.7 mm. Lung RADS 2.   06/19/2022: OV with Dr. Judeth Horn.  Symptoms felt to be related to asthma.  Most recent oncology note reviewed.  Discharge summary following embolization of right internal carotid artery.  Breathing has improved but slightly worse over the last month or 2.  Continues high-dose Symbicort and Spiriva.  Restarted smoking again due to stress of the aneurysm.  Smoking up to a pack a day.  Successful in the past with patches.  Advised to slowly increase physical activity to help with deconditioning.  Continued current inhaler regimen.  Smoking cessation advised.  09/11/2023: OV with Kimbely Whiteaker NP for  overdue follow-up.  She tells me that she has been relatively stable over the last year or so.  She has not any exacerbation to running steroids or antibiotics.  She did run out of her Spiriva a little over a month ago.  She feels like since she has been off of this, her breathing feels a little more short winded with certain activities.  She has an occasional cough; clear phlegm. Some wheezing if she does a lot of activity. Gets short of breath with distance walking and stairs.  Able to complete ADLs and activities around the house without much difficulty.  Rarely uses her rescue inhaler.  Denies any fevers, chills, chest congestion, hemoptysis, lower extremity swelling, orthopnea, palpitations.  Still using her Symbicort twice a day.  She is smoking again; up to 1 pack/day.  Wants to quit again.  She has used nicotine patches in the past successfully.  01/11/2024: Today - follow up Presents today for follow-up.  She has been doing relatively well since she was here last time.  She did restart the Spiriva.  She does feel like the combination of the Symbicort and the Spiriva helped her.  She still uses her albuterol once a day.  She did run out of this so she has been using her Symbicort midday if she is doing chores around the house.  Does not have any increased cough, chest congestion or wheezing.  When she does cough, she tends to get up clear phlegm.  Able to do things around the house and grocery shop on her own.  She is still smoking.  Nicotine patches were helping but they are expensive. Had  a recent lung cancer screening CT in October 2024 without any suspicious nodules or masses.  She denies any hemoptysis, anorexia, weight loss, night sweats.  Allergies  Allergen Reactions   Clarithromycin Swelling    Biaxin   Lyrica [Pregabalin]     Oversedated    Tape     Rash from Tegaderm   Ivp Dye [Iodinated Contrast Media] Rash    Immunization History  Administered Date(s) Administered    Influenza,inj,Quad PF,6+ Mos 10/25/2018, 10/04/2020, 01/31/2023   PFIZER Comirnaty(Gray Top)Covid-19 Tri-Sucrose Vaccine 06/28/2021   PFIZER(Purple Top)SARS-COV-2 Vaccination 03/25/2020, 04/19/2020   Pfizer Covid-19 Vaccine Bivalent Booster 14yrs & up 12/08/2021   Pfizer(Comirnaty)Fall Seasonal Vaccine 12 years and older 01/31/2023   Tdap 09/22/2016    Past Medical History:  Diagnosis Date   Anemia    Asthma    Cancer (HCC)    breast cancer   Depression    Dyspnea    Family history of breast cancer    Family history of liver cancer    History of radiation therapy 03/10/21-04/11/21   Right breast- Dr. Antony Blackbird   Pericarditis    Personal history of chemotherapy    Personal history of radiation therapy    Smoker    Umbilical hernia     Tobacco History: Social History   Tobacco Use  Smoking Status Every Day   Current packs/day: 1.00   Average packs/day: 1 pack/day for 25.0 years (25.0 ttl pk-yrs)   Types: Cigarettes  Smokeless Tobacco Never  Tobacco Comments   Using Nicotine Patch 03/06/22   Ready to quit: Not Answered Counseling given: Not Answered Tobacco comments: Using Nicotine Patch 03/06/22   Outpatient Medications Prior to Visit  Medication Sig Dispense Refill   anastrozole (ARIMIDEX) 1 MG tablet Take 1 tablet (1 mg total) by mouth daily. 90 tablet 3   aspirin 81 MG chewable tablet Chew 1 tablet (81 mg total) by mouth daily. 30 tablet 11   Calcium-Vitamin D-Vitamin K (VIACTIV PO) Take 1 tablet by mouth daily.     Cholecalciferol 25 MCG (1000 UT) tablet Take 2 tablets (2,000 Units total) by mouth daily. 90 tablet 0   gabapentin (NEURONTIN) 300 MG capsule Take 1 capsule (300 mg total) by mouth 2 (two) times daily. 180 capsule 3   loratadine (CLARITIN) 10 MG tablet Take 1 tablet (10 mg total) by mouth daily. 30 tablet 0   magnesium oxide (MAG-OX) 400 (240 Mg) MG tablet Take 1 tablet (400 mg total) by mouth at bedtime. 30 tablet 11   metroNIDAZOLE (FLAGYL) 500 MG  tablet Take 1 tablet (500 mg total) by mouth 2 (two) times daily. 14 tablet 0   mirtazapine (REMERON) 7.5 MG tablet Take 1 tablet (7.5 mg total) by mouth at bedtime. 30 tablet 3   nicotine (NICODERM CQ - DOSED IN MG/24 HOURS) 21 mg/24hr patch Place 1 patch (21 mg total) onto the skin daily. 28 patch 2   triamcinolone (KENALOG) 0.025 % ointment Apply 1 Application topically 2 (two) times daily. 30 g 0   triamcinolone cream (KENALOG) 0.1 % Apply 1 Application topically 2 (two) times daily for 2 weeks then stop. Can use again for flares. Do not put on face. 454 g 2   albuterol (PROAIR HFA) 108 (90 Base) MCG/ACT inhaler Inhale 2 puffs into the lungs every 6 (six) hours as needed for wheezing or shortness of breath. 18 g 0   budesonide-formoterol (SYMBICORT) 80-4.5 MCG/ACT inhaler Inhale 2 puffs into the lungs in the morning and  at bedtime. 10.2 g 0   Tiotropium Bromide Monohydrate (SPIRIVA RESPIMAT) 2.5 MCG/ACT AERS Inhale 2 puffs into the lungs daily. 4 g 6   No facility-administered medications prior to visit.     Review of Systems:   Constitutional: No weight loss or gain, night sweats, fevers, chills, fatigue, or lassitude. HEENT: No headaches, difficulty swallowing, tooth/dental problems, or sore throat. No sneezing, itching, ear ache, nasal congestion, or post nasal drip CV:  No chest pain, orthopnea, PND, swelling in lower extremities, anasarca, dizziness, palpitations, syncope Resp: +shortness of breath with exertion; chronic cough with clear phlegm. No excess mucus or change in color of mucus.  No wheeze. No hemoptysis. No chest wall deformity GI:  No heartburn, indigestion, abdominal pain, nausea, vomiting, diarrhea, change in bowel habits, loss of appetite, bloody stools.  Skin: No rash, lesions, ulcerations MSK:  No joint pain or swelling.   Neuro: No dizziness or lightheadedness.  Psych: No depression or anxiety. Mood stable.     Physical Exam:  BP 100/80 (BP Location: Left Arm,  Patient Position: Sitting, Cuff Size: Normal)   Pulse 63   Ht 5\' 5"  (1.651 m)   Wt 115 lb 8 oz (52.4 kg)   LMP 10/15/2017 (Approximate)   SpO2 98%   BMI 19.22 kg/m   GEN: Pleasant, interactive, well-appearing; in no acute distress HEENT:  Normocephalic and atraumatic. PERRLA. Sclera white. Nasal turbinates pink, moist and patent bilaterally. No rhinorrhea present. Oropharynx pink and moist, without exudate or edema. No lesions, ulcerations, or postnasal drip.  NECK:  Supple w/ fair ROM. No JVD present. Normal carotid impulses w/o bruits. Thyroid symmetrical with no goiter or nodules palpated. No lymphadenopathy.   CV: RRR, no m/r/g, no peripheral edema. Pulses intact, +2 bilaterally. No cyanosis, pallor or clubbing. PULMONARY:  Unlabored, regular breathing. Clear bilaterally A&P w/o wheezes/rales/rhonchi. No accessory muscle use.  GI: BS present and normoactive. Soft, non-tender to palpation. No organomegaly or masses detected.  MSK: No erythema, warmth or tenderness. Cap refil <2 sec all extrem. No deformities or joint swelling noted.  Neuro: A/Ox3. No focal deficits noted.   Skin: Warm, no lesions or rashe Psych: Normal affect and behavior. Judgement and thought content appropriate.     Lab Results:  CBC    Component Value Date/Time   WBC 7.8 03/15/2022 0530   RBC 3.69 (L) 03/15/2022 0530   HGB 13.3 11/02/2022 0806   HGB 12.4 10/27/2021 1447   HCT 39.0 11/02/2022 0806   PLT 246 03/15/2022 0530   PLT 200 10/27/2021 1447   MCV 86.4 03/15/2022 0530   MCH 29.0 03/15/2022 0530   MCHC 33.5 03/15/2022 0530   RDW 13.6 03/15/2022 0530   LYMPHSABS 2.5 03/15/2022 0530   MONOABS 0.8 03/15/2022 0530   EOSABS 0.0 03/15/2022 0530   BASOSABS 0.0 03/15/2022 0530    BMET    Component Value Date/Time   NA 142 11/02/2022 0806   K 3.6 11/02/2022 0806   CL 106 11/02/2022 0806   CO2 26 03/15/2022 0530   GLUCOSE 168 (H) 11/02/2022 0806   BUN 15 11/02/2022 0806   CREATININE 0.50  11/02/2022 0806   CREATININE 0.76 10/27/2021 1447   CREATININE 0.68 09/24/2017 0827   CALCIUM 9.0 03/15/2022 0530   GFRNONAA >60 03/15/2022 0530   GFRNONAA >60 10/27/2021 1447   GFRNONAA >89 09/22/2016 1501   GFRAA >60 09/20/2020 1415   GFRAA >89 09/22/2016 1501    BNP    Component Value Date/Time   BNP 44.2  12/26/2018 0646     Imaging:  No results found.  Administration History     None          Latest Ref Rng & Units 07/07/2021   10:46 AM  PFT Results  FVC-Pre L 2.27   FVC-Predicted Pre % 76   FVC-Post L 2.89   FVC-Predicted Post % 97   Pre FEV1/FVC % % 58   Post FEV1/FCV % % 58   FEV1-Pre L 1.32   FEV1-Predicted Pre % 55   FEV1-Post L 1.68   DLCO uncorrected ml/min/mmHg 13.85   DLCO UNC% % 64   DLCO corrected ml/min/mmHg 14.57   DLCO COR %Predicted % 67   DLVA Predicted % 65   TLC L 6.30   TLC % Predicted % 121   RV % Predicted % 173     No results found for: "NITRICOXIDE"      Assessment & Plan:   COPD with asthma Stable without recent exacerbations. No hospitalizations. Utilizing Symbicort more than prescribed due to running out of albuterol. Rx refilled today. Understands current maintenance regimen. Smoking cessation strongly advised. She will reach out to her insurance and the Quit Now line to see if they can help with affordability of NRT since this worked well for her previously. Encouraged to remain active. Action plan in place.  Patient Instructions  Continue Albuterol inhaler 2 puffs every 6 hours as needed for shortness of breath or wheezing. Notify if symptoms persist despite rescue inhaler/neb use.  Continue Symbicort 2 puffs Twice daily. Brush tongue and rinse mouth afterwards Continue Spiriva 2 puffs daily   Work on quitting smoking with nicotine patches again - call 1-800-QUITNOW line    Repeat CT with lung cancer screening program in October 2025. Call if you haven't heard from them regarding scheduling come September.  [phone]   Follow up in 6 months with Dr. Judeth Horn or Florentina Addison Areon Cocuzza,NP. If symptoms worsen, please contact office for sooner follow up or seek emergency care.    Cigarette smoker See above. Continue with lung cancer screening program  The patient's current tobacco use: 1 ppd The patient was advised to quit and impact of smoking on their health.  I assessed the patient's willingness to attempt to quit. I provided methods and skills for cessation. We reviewed medication management of smoking session drugs if appropriate. Resources to help quit smoking were provided. A smoking cessation quit date was set: 02/11/2024 Follow-up was arranged in our clinic.  The amount of time spent counseling patient was 5 mins    I spent 31 minutes of dedicated to the care of this patient on the date of this encounter to include pre-visit review of records, face-to-face time with the patient discussing conditions above, post visit ordering of testing, clinical documentation with the electronic health record, making appropriate referrals as documented, and communicating necessary findings to members of the patients care team.  Noemi Chapel, NP 01/11/2024  Pt aware and understands NP's role.

## 2024-01-11 NOTE — Assessment & Plan Note (Signed)
See above. Continue with lung cancer screening program  The patient's current tobacco use: 1 ppd The patient was advised to quit and impact of smoking on their health.  I assessed the patient's willingness to attempt to quit. I provided methods and skills for cessation. We reviewed medication management of smoking session drugs if appropriate. Resources to help quit smoking were provided. A smoking cessation quit date was set: 02/11/2024 Follow-up was arranged in our clinic.  The amount of time spent counseling patient was 5 mins

## 2024-01-13 ENCOUNTER — Other Ambulatory Visit: Payer: Self-pay

## 2024-01-13 ENCOUNTER — Encounter (HOSPITAL_COMMUNITY): Payer: Self-pay | Admitting: *Deleted

## 2024-01-13 ENCOUNTER — Emergency Department (HOSPITAL_COMMUNITY)
Admission: EM | Admit: 2024-01-13 | Discharge: 2024-01-13 | Disposition: A | Discharge status: Home / Self Care | Payer: 59 | Attending: Emergency Medicine | Admitting: Emergency Medicine

## 2024-01-13 DIAGNOSIS — R220 Localized swelling, mass and lump, head: Secondary | ICD-10-CM | POA: Insufficient documentation

## 2024-01-13 DIAGNOSIS — Z7982 Long term (current) use of aspirin: Secondary | ICD-10-CM | POA: Diagnosis not present

## 2024-01-13 DIAGNOSIS — R600 Localized edema: Secondary | ICD-10-CM

## 2024-01-13 NOTE — ED Triage Notes (Signed)
The pt was eating some rice approx 15 minutes ago when she felt a swelling to her lt jaw and neck   and her tongue feels strange

## 2024-01-13 NOTE — Discharge Instructions (Signed)
I suspect you had a mucous plugging the draiange of your salivary gland. Make sure you stay hydrated and if you can, eat foods that are very sour or bitter if it happens again. If it becomes more persistent you may need to see an ENT doctor.

## 2024-01-13 NOTE — ED Notes (Signed)
Patient verbalizes understanding of discharge instructions. Opportunity for questioning and answers were provided. Armband removed by staff, pt discharged from ED. Pt ambulatory to ED waiting room with steady gait.  

## 2024-01-13 NOTE — ED Provider Notes (Signed)
[facility] Provider Note   CSN: [id number] Arrival date & time: 01/13/24  0107     History  No chief complaint on file.   Amanda Rich is a 58 y.o. female.  Patient presents to the ED with left submandibular swelling just at the angle of the jaw. Has had something similar in the past related to a piece of food being stuck. No ttp to the area. Eating rice tonight and symptoms started. Did recently start mirtazapine.         Home Medications Prior to Admission medications   Medication Sig Start Date End Date Taking? Authorizing Provider  albuterol (PROAIR HFA) 108 (90 Base) MCG/ACT inhaler Inhale 2 puffs into the lungs every 6 (six) hours as needed for wheezing or shortness of breath. 01/11/24   Cobb, Ruby Cola, NP  anastrozole (ARIMIDEX) 1 MG tablet Take 1 tablet (1 mg total) by mouth daily. 10/01/23   Serena Croissant, MD  aspirin 81 MG chewable tablet Chew 1 tablet (81 mg total) by mouth daily. 04/10/22   Raulkar, Drema Pry, MD  budesonide-formoterol (SYMBICORT) 160-4.5 MCG/ACT inhaler Inhale 2 puffs into the lungs in the morning and at bedtime. 01/11/24   Cobb, Ruby Cola, NP  Calcium-Vitamin D-Vitamin K (VIACTIV PO) Take 1 tablet by mouth daily.    [provider]  Cholecalciferol 25 MCG (1000 UT) tablet Take 2 tablets (2,000 Units total) by mouth daily. 04/10/22   Raulkar, Drema Pry, MD  gabapentin (NEURONTIN) 300 MG capsule Take 1 capsule (300 mg total) by mouth 2 (two) times daily. 06/07/22   Serena Croissant, MD  loratadine (CLARITIN) 10 MG tablet Take 1 tablet (10 mg total) by mouth daily. 04/19/23   Raulkar, Drema Pry, MD  magnesium oxide (MAG-OX) 400 (240 Mg) MG tablet Take 1 tablet (400 mg total) by mouth at bedtime. 04/10/22   Raulkar, Drema Pry, MD  metroNIDAZOLE (FLAGYL) 500 MG tablet Take 1 tablet (500 mg total) by mouth 2 (two) times daily. 10/12/23   Joanne Gavel, MD  mirtazapine (REMERON) 7.5 MG tablet Take 1  tablet (7.5 mg total) by mouth at bedtime. 12/07/23   Raulkar, Drema Pry, MD  nicotine (NICODERM CQ - DOSED IN MG/24 HOURS) 21 mg/24hr patch Place 1 patch (21 mg total) onto the skin daily. 06/19/22   Hunsucker, Lesia Sago, MD  Tiotropium Bromide Monohydrate (SPIRIVA RESPIMAT) 2.5 MCG/ACT AERS Inhale 2 puffs into the lungs daily. 01/11/24   Cobb, Ruby Cola, NP  triamcinolone (KENALOG) 0.025 % ointment Apply 1 Application topically 2 (two) times daily. 05/25/23   Henson, Vickie L, NP-C  triamcinolone cream (KENALOG) 0.1 % Apply 1 Application topically 2 (two) times daily for 2 weeks then stop. Can use again for flares. Do not put on face. 09/17/23     prochlorperazine (COMPAZINE) 10 MG tablet Take 1 tablet (10 mg total) by mouth every 6 (six) hours as needed (Nausea or vomiting). 09/01/20 01/31/21  Serena Croissant, MD      Allergies    Clarithromycin, Lyrica [pregabalin], Tape, and Ivp dye [iodinated contrast media]    Review of Systems   Review of Systems  Physical Exam Updated Vital Signs BP 102/73   Pulse 75   Temp 97.7 F (36.5 C)   Resp 16   Ht 5\' 5"  (1.651 m)   Wt 52.4 kg   LMP 10/15/2017 (Approximate)   SpO2 100%   BMI 19.22 kg/m  Physical Exam Vitals and nursing note reviewed.  Constitutional:      Appearance: She is well-developed.  HENT:     Head: Normocephalic and atraumatic.     Comments: Left submandibular gland edema with a small amount of mucous at the sublingual orifice after removal the submandibular edema improved and quite a bit of saliva produced.  Cardiovascular:     Rate and Rhythm: Normal rate and regular rhythm.  Pulmonary:     Effort: No respiratory distress.     Breath sounds: No stridor.  Abdominal:     General: There is no distension.  Musculoskeletal:     Cervical back: Normal range of motion.  Neurological:     Mental Status: She is alert.     ED Results / Procedures / Treatments   Labs (all labs ordered are listed, but only abnormal results are  displayed) Labs Reviewed - No data to display  EKG None  Radiology No results found.  Procedures Procedures    Medications Ordered in ED Medications - No data to display  ED Course/ Medical Decision Making/ A&P                                 Medical Decision Making  Submandibular gland obstruction cleared here with rapid improvement in symptoms. No allergic reaction present, no angioedema present. Possibly related to the mirtazapine and it's known side effect of xerostomia. Will d/w her physician if it becomes a recurrent event.    Final Clinical Impression(s) / ED Diagnoses Final diagnoses:  Submandibular gland swelling    Rx / DC Orders ED Discharge Orders     None         Charlize Hathaway, Barbara Cower, MD 01/13/24 2305

## 2024-01-15 ENCOUNTER — Inpatient Hospital Stay: Payer: 59 | Attending: Hematology and Oncology | Admitting: Hematology and Oncology

## 2024-01-15 VITALS — BP 114/71 | HR 77 | Temp 97.9°F | Resp 18 | Ht 65.0 in | Wt 119.6 lb

## 2024-01-15 DIAGNOSIS — G4700 Insomnia, unspecified: Secondary | ICD-10-CM | POA: Insufficient documentation

## 2024-01-15 DIAGNOSIS — Z1722 Progesterone receptor negative status: Secondary | ICD-10-CM | POA: Insufficient documentation

## 2024-01-15 DIAGNOSIS — C50211 Malignant neoplasm of upper-inner quadrant of right female breast: Secondary | ICD-10-CM | POA: Diagnosis not present

## 2024-01-15 DIAGNOSIS — Z923 Personal history of irradiation: Secondary | ICD-10-CM | POA: Diagnosis not present

## 2024-01-15 DIAGNOSIS — Z79811 Long term (current) use of aromatase inhibitors: Secondary | ICD-10-CM | POA: Insufficient documentation

## 2024-01-15 DIAGNOSIS — R232 Flushing: Secondary | ICD-10-CM | POA: Insufficient documentation

## 2024-01-15 DIAGNOSIS — Z170 Estrogen receptor positive status [ER+]: Secondary | ICD-10-CM | POA: Insufficient documentation

## 2024-01-15 DIAGNOSIS — Z79899 Other long term (current) drug therapy: Secondary | ICD-10-CM | POA: Diagnosis not present

## 2024-01-15 DIAGNOSIS — F32A Depression, unspecified: Secondary | ICD-10-CM | POA: Diagnosis not present

## 2024-01-15 DIAGNOSIS — Z9221 Personal history of antineoplastic chemotherapy: Secondary | ICD-10-CM | POA: Insufficient documentation

## 2024-01-15 NOTE — Progress Notes (Signed)
Patient Care Team: Avanell Shackleton, NP-C as PCP - General (Family Medicine) Quintella Reichert, MD as PCP - Cardiology (Cardiology) Harriette Bouillon, MD as Consulting Physician (General Surgery) Serena Croissant, MD as Consulting Physician (Hematology and Oncology) Antony Blackbird, MD as Consulting Physician (Radiation Oncology)  DIAGNOSIS:  Encounter Diagnosis  Name Primary?   Malignant neoplasm of 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, estrogen receptor positive (HCC) Yes    SUMMARY OF ONCOLOGIC HISTORY: Oncology History  Malignant neoplasm of 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, estrogen receptor positive (HCC)  08/25/2020 Initial Diagnosis   Patient palpated a right breast lump x50yr and a left breast lump x27yr. Diagnostic mammogram and US showed in the right breast, a 2.7cm mass 5cm from the nipple and 0.8cm mass 1cm from the nipple at the 12:30 position, with borderline cortical thickening in the right axilla, and in the left breast, a 4.0cm mass at the 11 o'clock position representing a hamartoma. 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   09/13/2020 - 01/18/2021 Chemotherapy   Neoadjuvant chemotherapy with [facility] Perjeta       02/02/2021 Surgery   Right lumpectomy ([facility]): no residual carcinoma, 4 right axillary lymph nodes negative for carcinoma.   02/21/2021 - 08/30/2021 Chemotherapy      Patient is on Antibody Plan: BREAST TRASTUZUMAB + PERTUZUMAB Q21D     03/11/2021 - 04/11/2021 Radiation Therapy   Adjuvant radiation   09/2021 -  Anti-estrogen oral therapy   Anastrozole daily     CHIEF COMPLIANT: Concern for vibration sense in the right breast  HISTORY OF PRESENT ILLNESS:   History of Present Illness   The patient, with a history of breast cancer, presents with a new, occasional, vibrational sensation in the  breast where she previously had surgery. The sensation is not painful, but is noticeable and described as similar to a phone vibrating. The patient reports that the sensation is most noticeable when bending down. She has researched the symptom and believes it may be related to nerve damage from the surgery.  In addition to the vibrational sensation, the patient reports a recent visit to the emergency room due to swelling of a salivary gland. She also mentions a new medication, Remeron, which was prescribed for insomnia and depression. The patient reports that the medication is effective, but causes excessive drowsiness and she is considering reducing the dose.         ALLERGIES:  is allergic to clarithromycin, lyrica [pregabalin], tape, and ivp dye [iodinated contrast media].  MEDICATIONS:  Current Outpatient Medications  Medication Sig Dispense Refill   albuterol (PROAIR HFA) 108 (90 Base) MCG/ACT inhaler Inhale 2 puffs into the lungs every 6 (six) hours as needed for wheezing or shortness of breath. 18 g 4   anastrozole (ARIMIDEX) 1 MG tablet Take 1 tablet (1 mg total) by mouth daily. 90 tablet 3   aspirin 81 MG chewable tablet Chew 1 tablet (81 mg total) by mouth daily. 30 tablet 11   budesonide-formoterol (SYMBICORT) 160-4.5 MCG/ACT inhaler Inhale 2 puffs into the lungs in the morning and at bedtime. 10.2 g 11   Calcium-Vitamin D-Vitamin K (VIACTIV PO) Take 1 tablet by mouth daily.     Cholecalciferol 25 MCG (1000 UT) tablet Take 2 tablets (2,000 Units total) by mouth daily. 90 tablet 0   gabapentin (  NEURONTIN) 300 MG capsule Take 1 capsule (300 mg total) by mouth 2 (two) times daily. 180 capsule 3   loratadine (CLARITIN) 10 MG tablet Take 1 tablet (10 mg total) by mouth daily. 30 tablet 0   magnesium oxide (MAG-OX) 400 (240 Mg) MG tablet Take 1 tablet (400 mg total) by mouth at bedtime. 30 tablet 11   metroNIDAZOLE (FLAGYL) 500 MG tablet Take 1 tablet (500 mg total) by mouth 2 (two) times  daily. 14 tablet 0   mirtazapine (REMERON) 7.5 MG tablet Take 1 tablet (7.5 mg total) by mouth at bedtime. 30 tablet 3   nicotine (NICODERM CQ - DOSED IN MG/24 HOURS) 21 mg/24hr patch Place 1 patch (21 mg total) onto the skin daily. 28 patch 2   Tiotropium Bromide Monohydrate (SPIRIVA RESPIMAT) 2.5 MCG/ACT AERS Inhale 2 puffs into the lungs daily. 4 g 11   triamcinolone (KENALOG) 0.025 % ointment Apply 1 Application topically 2 (two) times daily. 30 g 0   triamcinolone cream (KENALOG) 0.1 % Apply 1 Application topically 2 (two) times daily for 2 weeks then stop. Can use again for flares. Do not put on face. 454 g 2   No current facility-administered medications for this visit.    PHYSICAL EXAMINATION: ECOG PERFORMANCE STATUS: 1 - Symptomatic but completely ambulatory  Vitals:   01/15/24 0824  BP: 114/71  Pulse: 77  Resp: 18  Temp: 97.9 F (36.6 C)  SpO2: 100%   Filed Weights   01/15/24 0824  Weight: 119 lb 9.6 oz (54.3 kg)    Physical Exam   BREAST: Scar tissue and cord-like areas present, indicative of post-surgical changes. Firmness from scars noted.      (exam performed in the presence of a chaperone)  LABORATORY DATA:  I have reviewed the data as listed    Latest Ref Rng & Units 11/02/2022    8:06 AM 03/15/2022    5:30 AM 03/07/2022    1:31 PM  CMP  Glucose 70 - 99 mg/dL 132  86  87   BUN 6 - 20 mg/dL 15  11  6    Creatinine 0.44 - 1.00 mg/dL 4.40  1.02  7.25   Sodium 135 - 145 mmol/L 142  138  139   Potassium 3.5 - 5.1 mmol/L 3.6  3.6  3.8   Chloride 98 - 111 mmol/L 106  102  106   CO2 22 - 32 mmol/L  26  26   Calcium 8.9 - 10.3 mg/dL  9.0  9.5   Total Protein 6.5 - 8.1 g/dL  5.6    Total Bilirubin 0.3 - 1.2 mg/dL  0.5    Alkaline Phos 38 - 126 U/L  41    AST 15 - 41 U/L  22    ALT 0 - 44 U/L  23      Lab Results  Component Value Date   WBC 7.8 03/15/2022   HGB 13.3 11/02/2022   HCT 39.0 11/02/2022   MCV 86.4 03/15/2022   PLT 246 03/15/2022   NEUTROABS  4.4 03/15/2022    ASSESSMENT & PLAN:  Malignant neoplasm of 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, estrogen receptor positive (HCC) Patient palpated a right breast lump x74yr and a left breast lump x10yr. Diagnostic mammogram and US showed in the right breast, a 2.7cm mass 5cm from the nipple and 0.8cm mass 1cm from the nipple at the 12:30 position, with borderline cortical thickening in the right axilla, and in the left breast, a  4.0cm mass at the 11 o'clock position representing a hamartoma. 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   Treatment plan: 1. Neoadjuvant chemotherapy with TCH Perjeta 6 cycles completed 01/18/2021 followed by Herceptin Perjeta maintenance for 1 year completed 08/30/2021 2. Followed by breast conserving surgery if possible with sentinel lymph node study 02/02/2021: Complete pathologic response, 0/4 lymph nodes negative 3. Followed by adjuvant radiation therapy 03/14/21- 04/08/21 4.  Followed by adjuvant antiestrogen therapy with letrozole started 06/28/2021, changed to anastrozole  ------------------------------------------------------------------------------------------------------------------------------------------- Current treatment: Anastrozole    CT Neck  and Chest: 05/20/22: Benign Anastrozole Toxicities: Hot flashes (better if she drinks less coffee)   Breast cancer surveillance: Mammograms: 03/12/2023: Benign, breast density category C Breast exam 01/15/2024: Benign (redness beneath the right breast appears to be inflammation: Patient has triamcinolone ointment encouraged her to use it on the skin)   Scalp nodules: I sent a referral to dermatology. Submandibular swelling: ED visit 01/13/2024: Recommend dedicated CT scan. Vibration sensation of the breast intermittently: I did not palpate any concerning findings.  It  must be scar tissue related sensation. I instructed her to get a mammogram in March and I put the orders for that.  She has a [employer] he and she provides resources for breast cancer survivors.  She would like to volunteer at the breast support groups. Return to clinic at her scheduled appointment in July.   ------------------------------------- Assessment and Plan    Post-Mastectomy Sensation Changes Reports occasional sensation of vibration in the breast, particularly when bending down. Likely related to nerve regeneration post-surgery. -Explained that this is a common occurrence post-surgery and is not concerning.  Insomnia and Depression Reports improved sleep and mood since starting Remeron 7.5mg , but experiencing excessive daytime sleepiness. -Reduce Remeron to 3.75mg  (half tablet) every other night to mitigate daytime sleepiness.  Breast Cancer Surveillance Three years post-chemotherapy, due for annual mammogram. -Schedule mammogram for March 2025.  Influenza Vaccination Due for annual influenza vaccination. -Administer influenza vaccine today if available.  Community Involvement Expressed interest in volunteering to support other breast cancer patients. 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          Orders Placed This Encounter  Procedures   MM DIAG BREAST TOMO BILATERAL    Standing Status:   Future    Expected Date:   03/12/2024    Expiration Date:   01/14/2025    Reason for Exam (SYMPTOM  OR DIAGNOSIS REQUIRED):   ANnual mammograms with H/O breast cancer    Is the patient pregnant?:   No    Preferred imaging location?:   [facility]    Release to patient:   Immediate   The patient has a good understanding of the overall plan. she agrees with it. she will call with any problems that may develop before the next visit here. Total time spent: 30 mins including face to face time and time spent for planning, charting  and co-ordination of care   Tamsen Meek, MD 01/15/24

## 2024-01-15 NOTE — Assessment & Plan Note (Signed)
Patient palpated a right breast lump x15yr and a left breast lump x42yr. Diagnostic mammogram and US showed in the right breast, a 2.7cm mass 5cm from the nipple and 0.8cm mass 1cm from the nipple at the 12:30 position, with borderline cortical thickening in the right axilla, and in the left breast, a 4.0cm mass at the 11 o'clock position representing a hamartoma. 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   Treatment plan: 1. Neoadjuvant chemotherapy with TCH Perjeta 6 cycles completed 01/18/2021 followed by Herceptin Perjeta maintenance for 1 year completed 08/30/2021 2. Followed by breast conserving surgery if possible with sentinel lymph node study 02/02/2021: Complete pathologic response, 0/4 lymph nodes negative 3. Followed by adjuvant radiation therapy 03/14/21- 04/08/21 4.  Followed by adjuvant antiestrogen therapy with letrozole started 06/28/2021, changed to anastrozole  ------------------------------------------------------------------------------------------------------------------------------------------- Current treatment: Anastrozole    CT Neck  and Chest: 05/20/22: Benign Anastrozole Toxicities: Hot flashes (better if she drinks less coffee)   Breast cancer surveillance: Mammograms: 03/12/2023: Benign, breast density category C Breast exam 01/15/2024: Benign (redness beneath the right breast appears to be inflammation: Patient has triamcinolone ointment encouraged her to use it on the skin)   Scalp nodules: I sent a referral to dermatology. Submandibular swelling: ED visit 01/13/2024: Recommend dedicated CT scan. Also recommended guardant reveal for MRD testing  Return to clinic in 1 year for follow-up

## 2024-01-23 ENCOUNTER — Other Ambulatory Visit: Payer: Self-pay

## 2024-01-25 ENCOUNTER — Ambulatory Visit (INDEPENDENT_AMBULATORY_CARE_PROVIDER_SITE_OTHER): Payer: 59 | Admitting: Family Medicine

## 2024-01-25 ENCOUNTER — Encounter: Payer: Self-pay | Admitting: Family Medicine

## 2024-01-25 VITALS — BP 116/84 | HR 82 | Temp 97.6°F | Ht 65.0 in | Wt 118.0 lb

## 2024-01-25 DIAGNOSIS — Z23 Encounter for immunization: Secondary | ICD-10-CM | POA: Diagnosis not present

## 2024-01-25 DIAGNOSIS — R051 Acute cough: Secondary | ICD-10-CM

## 2024-01-25 DIAGNOSIS — K115 Sialolithiasis: Secondary | ICD-10-CM

## 2024-01-25 NOTE — Patient Instructions (Signed)
If treating your symptoms with your inhalers and try Mucinex.  Stay hydrated.  Take Tylenol and do salt water gargles if needed for sore throat.  If you feel like your salivary gland is swelling again, try a limiting or sour candy.

## 2024-01-25 NOTE — Progress Notes (Signed)
Subjective:     Patient ID: Amanda Rich, female    DOB: 11-29-1966, 58 y.o.   MRN: [id number]  Chief Complaint  Patient presents with   Hospitalization Follow-up    D/c 01/13/24 for Submandibular gland swelling from [facility]. States she hasn't had anymore problems w it. Thinks she may need something for nasal congestion     HPI   History of Present Illness         C/o 2 day hx of ST, mild cough and chest congestion. States she feels fine o/w  Recent ED visit for a blocked salivary gland.  States her symptoms resolved.  States the ED physician massaged the submandibular gland and the stone dislodged.  Feels back to normal in that regard.    Health Maintenance Due  Topic Date Due   Pneumococcal Vaccine 63-26 Years old (1 of 2 - PCV) Never done   Zoster Vaccines- Shingrix (1 of 2) Never done    Past Medical History:  Diagnosis Date   Anemia    Asthma    Cancer (HCC)    breast cancer   Depression    Dyspnea    Family history of breast cancer    Family history of liver cancer    History of radiation therapy 03/10/21-04/11/21   Right breast- Dr. Antony Blackbird   Pericarditis    Personal history of chemotherapy    Personal history of radiation therapy    Smoker    Umbilical hernia     Past Surgical History:  Procedure Laterality Date   ABLATION ON ENDOMETRIOSIS     BREAST BIOPSY Right    BREAST LUMPECTOMY Right 02/02/2021   BREAST LUMPECTOMY WITH RADIOACTIVE SEED AND SENTINEL LYMPH NODE BIOPSY Right 02/02/2021   Procedure: RIGHT BREAST LUMPECTOMY WITH RADIOACTIVE SEED AND RIGHT SENTINEL LYMPH NODE MAPPING;  Surgeon: Harriette Bouillon, MD;  Location: [facility];  Service: General;  Laterality: Right;   GANGLION CYST EXCISION  12/25/2005   L wrist; [facility], [place]   IR ANGIO INTRA EXTRACRAN SEL INTERNAL CAROTID BILAT MOD SED  02/06/2022   IR ANGIO INTRA EXTRACRAN SEL INTERNAL CAROTID UNI R MOD SED  03/10/2022   IR ANGIO INTRA EXTRACRAN SEL INTERNAL  CAROTID UNI R MOD SED  11/02/2022   IR ANGIO VERTEBRAL SEL VERTEBRAL BILAT MOD SED  02/06/2022   IR ANGIOGRAM FOLLOW UP STUDY  03/14/2022   IR IMAGING GUIDED PORT INSERTION  09/10/2020   IR NEURO EACH ADD'L AFTER BASIC UNI RIGHT (MS)  03/14/2022   IR TRANSCATH/EMBOLIZ  03/10/2022   RADIOLOGY WITH ANESTHESIA N/A 03/10/2022   Procedure: 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 24s,  paternal second cousin   Lung cancer Other        dx. 87s, paternal cousin once removed (father's cousin)    Social History   Socioeconomic History   Marital status: Divorced    Spouse name: Not on file   Number of children: 3   Years of education: Not on file   Highest education level: Associate degree: academic program  Occupational History   Not on file  Tobacco Use   Smoking status: Every Day    Current packs/day: 1.00    Average packs/day: 1 pack/day for 25.0 years (25.0 ttl pk-yrs)    Types: Cigarettes   Smokeless tobacco: Never   Tobacco comments:    Using Nicotine Patch 03/06/22  Vaping Use   Vaping status: Never Used  Substance and Sexual Activity   Alcohol use: Not Currently    Comment: rarely   Drug use: No   Sexual activity: Not Currently    Birth control/protection: Post-menopausal  Other Topics Concern   Not on file  Social History Narrative   Not on file   Social Drivers  of Health   Financial Resource Strain: Low Risk  (06/13/2023)   Overall Financial Resource Strain (CARDIA)    Difficulty of Paying Living Expenses: Not hard at all  Food Insecurity: No Food Insecurity (06/13/2023)   Hunger Vital Sign    Worried About Running Out of Food in the Last Year: Never true    Ran Out of Food in the Last Year: Never true  Transportation Needs: No Transportation Needs (06/13/2023)   PRAPARE - Administrator, Civil Service (Medical): No    Lack of Transportation (Non-Medical): No  Physical Activity: Sufficiently Active (06/13/2023)   Exercise Vital Sign    Days of Exercise per Week: 5 days    Minutes of Exercise per Session: 30 min  Stress: No Stress Concern Present (06/13/2023)   [employer] of Occupational Health - Occupational Stress Questionnaire    Feeling of Stress : Not at all  Social Connections: Socially Integrated (06/13/2023)   Social Connection and Isolation Panel [NHANES]    Frequency of Communication with Friends and Family: More than three times a week    Frequency of Social Gatherings with Friends and Family: More than three times a week    Attends Religious Services: More than 4 times per year    Active Member of [employer] or Organizations: Yes    Attends Engineer, structural: More than 4 times per year    Marital Status: Married  Catering manager Violence: Not At Risk (06/13/2023)   Humiliation, Afraid, Rape, and Kick questionnaire    Fear of Current or Ex-Partner: No    Emotionally Abused: No    Physically Abused: No    Sexually Abused: No    Outpatient Medications Prior to Visit  Medication Sig Dispense Refill   albuterol (PROAIR HFA) 108 (90 Base) MCG/ACT inhaler Inhale 2 puffs into the lungs every 6 (six) hours as needed for wheezing or shortness of breath. 18 g 4   anastrozole (ARIMIDEX) 1 MG tablet Take 1 tablet (1 mg total) by mouth daily. 90 tablet 3   aspirin 81 MG chewable tablet Chew 1 tablet (81 mg total) by  mouth daily. 30 tablet 11   budesonide-formoterol (SYMBICORT) 160-4.5 MCG/ACT inhaler Inhale 2 puffs into the lungs in the morning and at bedtime. 10.2 g 11   Calcium-Vitamin D-Vitamin K (VIACTIV PO) Take 1 tablet by mouth daily.  Cholecalciferol 25 MCG (1000 UT) tablet Take 2 tablets (2,000 Units total) by mouth daily. 90 tablet 0   gabapentin (NEURONTIN) 300 MG capsule Take 1 capsule (300 mg total) by mouth 2 (two) times daily. 180 capsule 3   loratadine (CLARITIN) 10 MG tablet Take 1 tablet (10 mg total) by mouth daily. 30 tablet 0   magnesium oxide (MAG-OX) 400 (240 Mg) MG tablet Take 1 tablet (400 mg total) by mouth at bedtime. 30 tablet 11   mirtazapine (REMERON) 7.5 MG tablet Take 1 tablet (7.5 mg total) by mouth at bedtime. 30 tablet 3   Tiotropium Bromide Monohydrate (SPIRIVA RESPIMAT) 2.5 MCG/ACT AERS Inhale 2 puffs into the lungs daily. 4 g 11   triamcinolone (KENALOG) 0.025 % ointment Apply 1 Application topically 2 (two) times daily. 30 g 0   triamcinolone cream (KENALOG) 0.1 % Apply 1 Application topically 2 (two) times daily for 2 weeks then stop. Can use again for flares. Do not put on face. 454 g 2   metroNIDAZOLE (FLAGYL) 500 MG tablet Take 1 tablet (500 mg total) by mouth 2 (two) times daily. (Patient not taking: Reported on 01/25/2024) 14 tablet 0   nicotine (NICODERM CQ - DOSED IN MG/24 HOURS) 21 mg/24hr patch Place 1 patch (21 mg total) onto the skin daily. 28 patch 2   No facility-administered medications prior to visit.    Allergies  Allergen Reactions   Clarithromycin Swelling    Biaxin   Lyrica [Pregabalin]     Oversedated    Tape     Rash from Tegaderm   Ivp Dye [Iodinated Contrast Media] Rash    Review of Systems  Constitutional:  Negative for chills, fever and malaise/fatigue.  HENT:  Positive for sore throat. Negative for ear pain and sinus pain.   Respiratory:  Positive for cough and sputum production. Negative for shortness of breath.    Cardiovascular:  Negative for chest pain, palpitations and leg swelling.  Gastrointestinal:  Negative for abdominal pain, constipation, diarrhea, nausea and vomiting.  Musculoskeletal:  Negative for myalgias.  Neurological:  Negative for dizziness, focal weakness and headaches.       Objective:    Physical Exam Constitutional:      General: She is not in acute distress.    Appearance: She is not ill-appearing.  HENT:     Nose: Nose normal.     Mouth/Throat:     Mouth: Mucous membranes are moist.     Pharynx: Oropharynx is clear.  Eyes:     Extraocular Movements: Extraocular movements intact.     Conjunctiva/sclera: Conjunctivae normal.  Cardiovascular:     Rate and Rhythm: Normal rate and regular rhythm.  Pulmonary:     Effort: Pulmonary effort is normal.     Breath sounds: Normal breath sounds.  Musculoskeletal:     Cervical back: Normal range of motion and neck supple. No tenderness.  Lymphadenopathy:     Cervical: No cervical adenopathy.  Skin:    General: Skin is warm and dry.  Neurological:     General: No focal deficit present.     Mental Status: She is alert and oriented to person, place, and time.  Psychiatric:        Mood and Affect: Mood normal.        Behavior: Behavior normal.        Thought Content: Thought content normal.      BP 116/84 (BP Location: Left Arm, Patient Position: Sitting, Cuff Size: Normal)   Pulse  82   Temp 97.6 F (36.4 C) (Temporal)   Ht 5\' 5"  (1.651 m)   Wt 118 lb (53.5 kg)   LMP 10/15/2017 (Approximate)   SpO2 99%   BMI 19.64 kg/m  Wt Readings from Last 3 Encounters:  01/25/24 118 lb (53.5 kg)  01/15/24 119 lb 9.6 oz (54.3 kg)  01/13/24 115 lb 8.3 oz (52.4 kg)       Assessment & Plan:   Problem List Items Addressed This Visit   None Visit Diagnoses       Acute cough    -  Primary     Need for influenza vaccination       Relevant Orders   Flu vaccine trivalent PF, 6mos and older(Flulaval,Afluria,Fluarix,Fluzone)  (Completed)     Sialolithiasis of submandibular gland          She is here for follow-up regarding her recent ED visit for a blocked salivary gland.  States it is no longer blocked.  No longer having swelling or pain. New concern today is a 2-day history of a mild cough.  She is using her inhalers.  Try Mucinex.  Salt water gargles and Tylenol as needed.  Follow-up if worsening.  I have discontinued Sonji A. Kaeding's nicotine and metroNIDAZOLE. I am also having her maintain her Calcium-Vitamin D-Vitamin K (VIACTIV PO), magnesium oxide, aspirin, Cholecalciferol, gabapentin, loratadine, triamcinolone, triamcinolone cream, anastrozole, mirtazapine, albuterol, budesonide-formoterol, and Spiriva Respimat.  No orders of the defined types were placed in this encounter.

## 2024-02-05 ENCOUNTER — Other Ambulatory Visit (HOSPITAL_COMMUNITY): Payer: Self-pay

## 2024-03-06 ENCOUNTER — Other Ambulatory Visit (HOSPITAL_COMMUNITY): Payer: Self-pay

## 2024-03-12 ENCOUNTER — Ambulatory Visit
Admission: RE | Admit: 2024-03-12 | Discharge: 2024-03-12 | Disposition: A | Discharge status: Home / Self Care | Payer: 59 | Source: Ambulatory Visit | Attending: Hematology and Oncology

## 2024-03-12 DIAGNOSIS — Z170 Estrogen receptor positive status [ER+]: Secondary | ICD-10-CM

## 2024-03-12 DIAGNOSIS — Z08 Encounter for follow-up examination after completed treatment for malignant neoplasm: Secondary | ICD-10-CM | POA: Diagnosis not present

## 2024-03-12 DIAGNOSIS — Z853 Personal history of malignant neoplasm of breast: Secondary | ICD-10-CM | POA: Diagnosis not present

## 2024-03-18 ENCOUNTER — Other Ambulatory Visit (HOSPITAL_COMMUNITY): Payer: Self-pay

## 2024-03-18 ENCOUNTER — Encounter: Payer: Self-pay | Admitting: Family Medicine

## 2024-04-14 ENCOUNTER — Other Ambulatory Visit (HOSPITAL_COMMUNITY): Payer: Self-pay

## 2024-04-28 ENCOUNTER — Other Ambulatory Visit (HOSPITAL_COMMUNITY): Payer: Self-pay

## 2024-04-28 ENCOUNTER — Other Ambulatory Visit: Payer: Self-pay | Admitting: Physical Medicine and Rehabilitation

## 2024-04-28 ENCOUNTER — Other Ambulatory Visit: Payer: Self-pay

## 2024-04-29 ENCOUNTER — Other Ambulatory Visit (HOSPITAL_COMMUNITY): Payer: Self-pay

## 2024-04-29 ENCOUNTER — Other Ambulatory Visit: Payer: Self-pay

## 2024-04-29 MED ORDER — MIRTAZAPINE 7.5 MG PO TABS
7.5000 mg | ORAL_TABLET | Freq: Every day | ORAL | 3 refills | Status: AC
  Filled 2024-04-29: qty 30, 30d supply, fill #0
  Filled 2024-06-12: qty 30, 30d supply, fill #1

## 2024-05-18 ENCOUNTER — Other Ambulatory Visit: Payer: Self-pay

## 2024-06-06 ENCOUNTER — Encounter: Payer: 59 | Admitting: Physical Medicine and Rehabilitation

## 2024-06-12 ENCOUNTER — Other Ambulatory Visit (HOSPITAL_COMMUNITY): Payer: Self-pay

## 2024-06-18 ENCOUNTER — Ambulatory Visit: Payer: 59

## 2024-06-18 VITALS — Ht 65.0 in | Wt 118.0 lb

## 2024-06-18 DIAGNOSIS — Z0000 Encounter for general adult medical examination without abnormal findings: Secondary | ICD-10-CM | POA: Diagnosis not present

## 2024-06-18 NOTE — Patient Instructions (Signed)
 [name] , Thank you for taking time out of your busy schedule to complete your Annual Wellness Visit with me. I enjoyed our conversation and look forward to speaking with you again next year. I, as well as your care team,  appreciate your ongoing commitment to your health goals. Please review the following plan we discussed and let me know if I can assist you in the future. Your Game plan/ To Do List    Follow up Visits: Next Medicare AWV with our clinical staff: 06/19/2025 - in the office at 8am   Have you seen your provider in the last 6 months (3 months if uncontrolled diabetes)? Yes Next Office Visit with your provider: 07/02/2024  Clinician Recommendations:  Aim for 30 minutes of exercise or brisk walking, 6-8 glasses of water, and 5 servings of fruits and vegetables each day. Educated and advised on getting the Pneumonia and Hepatitis B vaccines in 2025.        This is a list of the screening recommended for you and due dates:  Health Maintenance  Topic Date Due   Pneumococcal Vaccination (1 of 2 - PCV) Never done   Hepatitis B Vaccine (1 of 3 - 19+ 3-dose series) Never done   Zoster (Shingles) Vaccine (1 of 2) Never done   COVID-19 Vaccine (6 - 2024-25 season) 08/26/2023   Flu Shot  07/25/2024   Screening for Lung Cancer  10/16/2024   Medicare Annual Wellness Visit  06/18/2025   Mammogram  03/12/2026   DTaP/Tdap/Td vaccine (2 - Td or Tdap) 09/22/2026   Colon Cancer Screening  03/29/2028   Pap with HPV screening  10/08/2028   Hepatitis C Screening  Completed   HIV Screening  Completed   HPV Vaccine  Aged Out   Meningitis B Vaccine  Aged Out    Advanced directives: (Copy Requested) Please bring a copy of your health care power of attorney and living will to the office to be added to your chart at your convenience. You can mail to [facility] [address] [place], [place] [address] or email to [email or web address] Advance Care Planning is important  because it:  [x]  Makes sure you receive the medical care that is consistent with your values, goals, and preferences  [x]  It provides guidance to your family and loved ones and reduces their decisional burden about whether or not they are making the right decisions based on your wishes.  Follow the link provided in your after visit summary or read over the paperwork we have mailed to you to help you started getting your Advance Directives in place. If you need assistance in completing these, please reach out to us  so that we can help you!

## 2024-06-18 NOTE — Progress Notes (Signed)
 Subjective:   Amanda Rich is a 58 y.o. who presents for a Medicare Wellness preventive visit.  As a reminder, Annual Wellness Visits don't include a physical exam, and some assessments may be limited, especially if this visit is performed virtually. We may recommend an in-person follow-up visit with your provider if needed.  Visit Complete: Virtual I connected with  Amanda Rich on 06/18/24 by a audio enabled telemedicine application and verified that I am speaking with the correct person using two identifiers.  Patient Location: Home  Provider Location: Office/Clinic  I discussed the limitations of evaluation and management by telemedicine. The patient expressed understanding and agreed to proceed.  Vital Signs: Because this visit was a virtual/telehealth visit, some criteria may be missing or patient reported. Any vitals not documented were not able to be obtained and vitals that have been documented are patient reported.  VideoDeclined- This patient declined Librarian, academic. Therefore the visit was completed with audio only.  Persons Participating in Visit: Patient.  AWV Questionnaire: No: Patient Medicare AWV questionnaire was not completed prior to this visit.  Cardiac Risk Factors include: advanced age (>55men, >33 women);dyslipidemia;smoking/ tobacco exposure     Objective:    Today's Vitals   06/18/24 1455  Weight: 118 lb (53.5 kg)  Height: 5' 5 (1.651 m)   Body mass index is 19.64 kg/m.     06/18/2024    2:54 PM 01/13/2024    1:15 AM 07/03/2023    2:53 PM 06/13/2023    1:39 PM 11/02/2022    6:57 AM 11/01/2022    8:26 AM 03/14/2022    1:40 PM  Advanced Directives  Does Patient Have a Medical Advance Directive? Yes No Yes Yes Yes Yes No  Type of Estate agent of [place];Living will  Healthcare Power of [place];Living will Healthcare Power of [place];Living will Healthcare Power of [place];Living will  Healthcare Power of [place];Living will   Does patient want to make changes to medical advance directive?   No - Patient declined  No - Patient declined No - Patient declined No - Patient declined  Copy of Healthcare Power of Attorney in Chart? No - copy requested  No - copy requested No - copy requested     Would patient like information on creating a medical advance directive?   No - Patient declined   No - Patient declined No - Patient declined    Current Medications (verified) Outpatient Encounter Medications as of 06/18/2024  Medication Sig   albuterol  (PROAIR  HFA) 108 (90 Base) MCG/ACT inhaler Inhale 2 puffs into the lungs every 6 (six) hours as needed for wheezing or shortness of breath.   anastrozole  (ARIMIDEX ) 1 MG tablet Take 1 tablet (1 mg total) by mouth daily.   aspirin  81 MG chewable tablet Chew 1 tablet (81 mg total) by mouth daily.   budesonide -formoterol  (SYMBICORT ) 160-4.5 MCG/ACT inhaler Inhale 2 puffs into the lungs in the morning and at bedtime.   Calcium-Vitamin D -Vitamin K (VIACTIV PO) Take 1 tablet by mouth daily.   Cholecalciferol  25 MCG (1000 UT) tablet Take 2 tablets (2,000 Units total) by mouth daily.   gabapentin  (NEURONTIN ) 300 MG capsule Take 1 capsule (300 mg total) by mouth 2 (two) times daily.   loratadine  (CLARITIN ) 10 MG tablet Take 1 tablet (10 mg total) by mouth daily.   magnesium  oxide (MAG-OX) 400 (240 Mg) MG tablet Take 1 tablet (400 mg total) by mouth at bedtime.   mirtazapine  (REMERON ) 7.5  MG tablet Take 1 tablet (7.5 mg total) by mouth at bedtime.   Tiotropium Bromide  Monohydrate (SPIRIVA  RESPIMAT) 2.5 MCG/ACT AERS Inhale 2 puffs into the lungs daily.   triamcinolone  (KENALOG ) 0.025 % ointment Apply 1 Application topically 2 (two) times daily.   triamcinolone  cream (KENALOG ) 0.1 % Apply 1 Application topically 2 (two) times daily for 2 weeks then stop. Can use again for flares. Do not put on face.   [DISCONTINUED] prochlorperazine  (COMPAZINE ) 10 MG  tablet Take 1 tablet (10 mg total) by mouth every 6 (six) hours as needed (Nausea or vomiting).   No facility-administered encounter medications on file as of 06/18/2024.    Allergies (verified) Clarithromycin , Lyrica  [pregabalin ], Tape, and Ivp dye [iodinated contrast media]   History: Past Medical History:  Diagnosis Date   Anemia    Asthma    Cancer (HCC)    breast cancer   Depression    Dyspnea    Family history of breast cancer    Family history of liver cancer    History of radiation therapy 03/10/21-04/11/21   Right breast- Dr. Lynwood Nasuti   Pericarditis    Personal history of chemotherapy    Personal history of radiation therapy    Smoker    Umbilical hernia    Past Surgical History:  Procedure Laterality Date   ABLATION ON ENDOMETRIOSIS     BREAST BIOPSY Right    BREAST LUMPECTOMY Right 02/02/2021   BREAST LUMPECTOMY WITH RADIOACTIVE SEED AND SENTINEL LYMPH NODE BIOPSY Right 02/02/2021   Procedure: RIGHT BREAST LUMPECTOMY WITH RADIOACTIVE SEED AND RIGHT SENTINEL LYMPH NODE MAPPING;  Surgeon: Vanderbilt Ned, MD;  Location: [facility];  Service: General;  Laterality: Right;   GANGLION CYST EXCISION  12/25/2005   L wrist; [facility], [place]   IR ANGIO INTRA EXTRACRAN SEL INTERNAL CAROTID BILAT MOD SED  02/06/2022   IR ANGIO INTRA EXTRACRAN SEL INTERNAL CAROTID UNI R MOD SED  03/10/2022   IR ANGIO INTRA EXTRACRAN SEL INTERNAL CAROTID UNI R MOD SED  11/02/2022   IR ANGIO VERTEBRAL SEL VERTEBRAL BILAT MOD SED  02/06/2022   IR ANGIOGRAM FOLLOW UP STUDY  03/14/2022   IR IMAGING GUIDED PORT INSERTION  09/10/2020   IR NEURO EACH ADD'L AFTER BASIC UNI RIGHT (MS)  03/14/2022   IR TRANSCATH/EMBOLIZ  03/10/2022   RADIOLOGY WITH ANESTHESIA N/A 03/10/2022   Procedure: 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 86s, paternal second cousin   Lung cancer Other        dx. 65s, paternal cousin once removed (father's cousin)   Social History   Socioeconomic History   Marital status: Divorced    Spouse name: Not  on file   Number of children: 3   Years of education: Not on file   Highest education level: Associate degree: academic program  Occupational History   Not on file  Tobacco Use   Smoking status: Every Day    Current packs/day: 1.00    Average packs/day: 1 pack/day for 38.5 years (38.5 ttl pk-yrs)    Types: Cigarettes    Start date: 12/25/1985   Smokeless tobacco: Never   Tobacco comments:    Used Nicotine  Patch 03/06/22.  Vaping Use   Vaping status: Never Used  Substance and Sexual Activity   Alcohol use: Not Currently    Comment: rarely   Drug use: No   Sexual activity: Not Currently    Birth control/protection: Post-menopausal  Other Topics Concern   Not on file  Social History Narrative   Not on file   Social Drivers of Health   Financial Resource Strain: Low Risk  (06/18/2024)   Overall Financial Resource Strain (CARDIA)    Difficulty of Paying Living Expenses: Not hard at all  Food Insecurity: No Food Insecurity (06/18/2024)   Hunger Vital Sign    Worried About Running Out of Food in the Last Year: Never true    Ran Out of Food in the Last Year: Never true  Transportation Needs: No Transportation Needs (06/18/2024)   PRAPARE - Administrator, Civil Service (Medical): No    Lack of Transportation (Non-Medical):  No  Physical Activity: Sufficiently Active (06/18/2024)   Exercise Vital Sign    Days of Exercise per Week: 5 days    Minutes of Exercise per Session: 30 min  Stress: Stress Concern Present (06/18/2024)   [employer] of Occupational Health - Occupational Stress Questionnaire    Feeling of Stress: To some extent  Social Connections: Moderately Integrated (06/18/2024)   Social Connection and Isolation Panel    Frequency of Communication with Friends and Family: More than three times a week    Frequency of Social Gatherings with Friends and Family: More than three times a week    Attends Religious Services: More than 4 times per year    Active Member of [employer] or Organizations: Yes    Attends Banker Meetings: More than 4 times per year    Marital Status: Divorced    Tobacco Counseling Ready to quit: Yes Counseling given: Yes Tobacco comments: Used Nicotine  Patch 03/06/22.    Clinical Intake:  Pre-visit preparation completed: Yes  Pain : No/denies pain     BMI - recorded: 19.64 Nutritional Status: BMI of 19-24  Normal Nutritional Risks: None Diabetes: No  Lab Results  Component Value Date   HGBA1C 4.9 12/26/2018   HGBA1C 4.8 09/22/2016     How often do you need to have someone help you when you read instructions, pamphlets, or other written materials from your doctor or pharmacy?: 1 - Never  Interpreter Needed?: No  Information entered by :: Verdie Saba, CMA   Activities of Daily Living     06/18/2024    2:58 PM  In your present state of health, do you have any difficulty performing the following activities:  Hearing? 0  Vision? 0  Difficulty concentrating or making decisions? 0  Walking or climbing stairs? 1  Comment uses a cane prn  Dressing or bathing? 0  Doing errands, shopping? 0  Preparing Food and eating ? N  Using the Toilet? N  In the past six months, have you accidently leaked urine? N  Do you have problems with loss of bowel  control? N  Managing your Medications? N  Managing your Finances? N  Housekeeping or managing your Housekeeping? N    Patient Care Team: Lendia Boby CROME, NP-C as PCP - General (Family Medicine) Shlomo Wilbert SAUNDERS, MD as PCP - Cardiology (Cardiology) Vanderbilt Ned, MD as Consulting Physician (General Surgery) Odean Potts, MD as Consulting Physician (Hematology and Oncology) Shannon Agent, MD as Consulting Physician (Radiation Oncology) [facility] (Ophthalmology)  I have updated your Care Teams any recent Medical Services you may have received from other providers in the past year.     Assessment:   This is a routine wellness examination for Amanda Rich.  Hearing/Vision screen Hearing Screening - Comments:: Denies hearing difficulties   Vision Screening - Comments:: Wears rx glasses - up to date with routine eye exams with [facility]    Goals Addressed               This Visit's Progress     Patient Stated (pt-stated)        Patient stated she plans to quit smoking but is slightly stressed.         Depression Screen     06/18/2024    3:00 PM 12/07/2023   10:16 AM 10/09/2023    4:57 PM 06/13/2023    1:41 PM 05/25/2023    1:12 PM 03/02/2023    8:13 AM 12/05/2022   11:37 AM  PHQ 2/9 Scores  PHQ - 2 Score 0 0 0 0 1 0 2  PHQ- 9 Score 2  6 4        Fall Risk     06/18/2024    2:59 PM 12/07/2023   10:16 AM 06/13/2023    1:41 PM 05/25/2023    1:11 PM 03/02/2023    8:13 AM  Fall Risk   Falls in the past year? 0 0 0 0 0  Number falls in past yr: 0  0 0 0  Injury with Fall? 0  0 0 0  Risk for fall due to : No Fall Risks;Impaired balance/gait  No Fall Risks No Fall Risks No Fall Risks  Follow up Falls evaluation completed;Falls prevention discussed  Falls prevention discussed Falls evaluation completed Falls evaluation completed    MEDICARE RISK AT HOME:  Medicare Risk at Home Any stairs in or around the home?: Yes (outside) If so, are there any  without handrails?: No Home free of loose throw rugs in walkways, pet beds, electrical cords, etc?: Yes Adequate lighting in your home to reduce risk of falls?: Yes Life alert?: No Use of a cane, walker or w/c?: Yes (cane) Grab bars in the bathroom?: No Shower chair or bench in shower?: Yes Elevated toilet seat or a handicapped toilet?: Yes  TIMED UP AND GO:  Was the test performed?  No  Cognitive Function: 6CIT completed        06/18/2024    3:03 PM 06/13/2023    1:44 PM  6CIT Screen  What Year? 0 points 0 points  What month? 0 points 0 points  What time? 0 points 0 points  Count back from 20 0 points 0 points  Months in reverse 0 points 0 points  Repeat phrase 2 points 0 points  Total Score 2 points 0 points    Immunizations Immunization History  Administered Date(s) Administered   Influenza, Seasonal, Injecte, Preservative Fre 01/25/2024   Influenza,inj,Quad PF,6+ Mos 10/25/2018, 10/04/2020, 01/31/2023   PFIZER Comirnaty(Gray  Top)Covid-19 Tri-Sucrose Vaccine 06/28/2021   PFIZER(Purple Top)SARS-COV-2 Vaccination 03/25/2020, 04/19/2020   Pfizer Covid-19 Vaccine Bivalent Booster 28yrs & up 12/08/2021   Pfizer(Comirnaty)Fall Seasonal Vaccine 12 years and older 01/31/2023   Tdap 09/22/2016    Screening Tests Health Maintenance  Topic Date Due   Pneumococcal Vaccine 13-84 Years old (1 of 2 - PCV) Never done   Hepatitis B Vaccines (1 of 3 - 19+ 3-dose series) Never done   Zoster Vaccines- Shingrix (1 of 2) Never done   COVID-19 Vaccine (6 - 2024-25 season) 08/26/2023   INFLUENZA VACCINE  07/25/2024   Lung Cancer Screening  10/16/2024   Medicare Annual Wellness (AWV)  06/18/2025   MAMMOGRAM  03/12/2026   DTaP/Tdap/Td (2 - Td or Tdap) 09/22/2026   Colonoscopy  03/29/2028   Cervical Cancer Screening (HPV/Pap Cotest)  10/08/2028   Hepatitis C Screening  Completed   HIV Screening  Completed   HPV VACCINES  Aged Out   Meningococcal B Vaccine  Aged Out    Health  Maintenance  Health Maintenance Due  Topic Date Due   Pneumococcal Vaccine 34-38 Years old (1 of 2 - PCV) Never done   Hepatitis B Vaccines (1 of 3 - 19+ 3-dose series) Never done   Zoster Vaccines- Shingrix (1 of 2) Never done   COVID-19 Vaccine (6 - 2024-25 season) 08/26/2023   Health Maintenance Items Addressed: 06/18/2024   Additional Screening:  Vision Screening: Recommended annual ophthalmology exams for early detection of glaucoma and other disorders of the eye. Would you like a referral to an eye doctor? No    Dental Screening: Recommended annual dental exams for proper oral hygiene  Community Resource Referral / Chronic Care Management: CRR required this visit?  No   CCM required this visit?  No   Plan:    I have personally reviewed and noted the following in the patient's chart:   Medical and social history Use of alcohol, tobacco or illicit drugs  Current medications and supplements including opioid prescriptions. Patient is not currently taking opioid prescriptions. Functional ability and status Nutritional status Physical activity Advanced directives List of other physicians Hospitalizations, surgeries, and ER visits in previous 12 months Vitals Screenings to include cognitive, depression, and falls Referrals and appointments  In addition, I have reviewed and discussed with patient certain preventive protocols, quality metrics, and best practice recommendations. A written personalized care plan for preventive services as well as general preventive health recommendations were provided to patient.   Verdie CHRISTELLA Saba, CMA   06/18/2024   After Visit Summary: (MyChart) Due to this being a telephonic visit, the after visit summary with patients personalized plan was offered to patient via MyChart   Notes: Appt scheduled w/PCP to discuss quitting smoking and get Pneumococcal vaccine.

## 2024-07-01 ENCOUNTER — Other Ambulatory Visit (HOSPITAL_COMMUNITY): Payer: Self-pay

## 2024-07-01 ENCOUNTER — Other Ambulatory Visit: Payer: Self-pay

## 2024-07-02 ENCOUNTER — Ambulatory Visit: Admitting: Family Medicine

## 2024-07-02 NOTE — Assessment & Plan Note (Signed)
 Patient palpated a right breast lump x13yr and a left breast lump x26yr. Diagnostic mammogram and US  showed in the right breast, a 2.7cm mass 5cm from the nipple and 0.8cm mass 1cm from the nipple at the 12:30 position, with borderline cortical thickening in the right axilla, and in the left breast, a 4.0cm mass at the 11 o'clock position representing a hamartoma. 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   Treatment plan: 1. Neoadjuvant chemotherapy with [facility] Perjeta  6 cycles completed 01/18/2021 followed by Herceptin  Perjeta  maintenance for 1 year completed 08/30/2021 2. Followed by breast conserving surgery if possible with sentinel lymph node study 02/02/2021: Complete pathologic response, 0/4 lymph nodes negative 3. Followed by adjuvant radiation therapy 03/14/21- 04/08/21 4.  Followed by adjuvant antiestrogen therapy with letrozole  started 06/28/2021, changed to anastrozole   ------------------------------------------------------------------------------------------------------------------------------------------- Current treatment: Anastrozole     CT Neck  and Chest: 05/20/22: Benign Anastrozole  Toxicities: Hot flashes (better if she drinks less coffee)   Breast cancer surveillance: Mammograms: 03/12/2023: Benign, breast density category C Breast exam 01/15/2024: Benign (redness beneath the right breast appears to be inflammation: Patient has triamcinolone  ointment encouraged her to use it on the skin)   Scalp nodules: I sent a referral to dermatology. Submandibular swelling: ED visit 01/13/2024: Recommend dedicated CT scan. Vibration sensation of the breast intermittently: I did not palpate any concerning findings.  It must be scar tissue related sensation. I instructed her to get a mammogram in March and I put the orders for that.   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  She would like to volunteer at the breast support groups. Return to clinic at her scheduled appointment in July.

## 2024-07-03 ENCOUNTER — Inpatient Hospital Stay: Payer: 59 | Attending: Hematology and Oncology | Admitting: Hematology and Oncology

## 2024-07-03 VITALS — BP 106/75 | HR 68 | Temp 98.5°F | Resp 16 | Ht 65.0 in | Wt 116.4 lb

## 2024-07-03 DIAGNOSIS — Z7982 Long term (current) use of aspirin: Secondary | ICD-10-CM | POA: Diagnosis not present

## 2024-07-03 DIAGNOSIS — R232 Flushing: Secondary | ICD-10-CM | POA: Diagnosis not present

## 2024-07-03 DIAGNOSIS — Z79899 Other long term (current) drug therapy: Secondary | ICD-10-CM | POA: Insufficient documentation

## 2024-07-03 DIAGNOSIS — Z79811 Long term (current) use of aromatase inhibitors: Secondary | ICD-10-CM | POA: Insufficient documentation

## 2024-07-03 DIAGNOSIS — C50211 Malignant neoplasm of upper-inner quadrant of right female breast: Secondary | ICD-10-CM | POA: Insufficient documentation

## 2024-07-03 DIAGNOSIS — Z7951 Long term (current) use of inhaled steroids: Secondary | ICD-10-CM | POA: Insufficient documentation

## 2024-07-03 DIAGNOSIS — G4700 Insomnia, unspecified: Secondary | ICD-10-CM | POA: Diagnosis not present

## 2024-07-03 DIAGNOSIS — Z923 Personal history of irradiation: Secondary | ICD-10-CM | POA: Diagnosis not present

## 2024-07-03 DIAGNOSIS — Z9221 Personal history of antineoplastic chemotherapy: Secondary | ICD-10-CM | POA: Insufficient documentation

## 2024-07-03 DIAGNOSIS — R634 Abnormal weight loss: Secondary | ICD-10-CM | POA: Diagnosis not present

## 2024-07-03 DIAGNOSIS — Z170 Estrogen receptor positive status [ER+]: Secondary | ICD-10-CM | POA: Insufficient documentation

## 2024-07-03 NOTE — Progress Notes (Signed)
 Patient Care Team: Lendia Boby CROME, NP-C as PCP - General (Family Medicine) Shlomo Wilbert SAUNDERS, MD as PCP - Cardiology (Cardiology) Vanderbilt Ned, MD as Consulting Physician (General Surgery) Odean Potts, MD as Consulting Physician (Hematology and Oncology) Shannon Agent, MD as Consulting Physician (Radiation Oncology) [facility] (Ophthalmology)  DIAGNOSIS:  Encounter Diagnosis  Name Primary?   Malignant neoplasm of 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, estrogen receptor positive (HCC) Yes    SUMMARY OF ONCOLOGIC HISTORY: Oncology History  Malignant neoplasm of 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, estrogen receptor positive (HCC)  08/25/2020 Initial Diagnosis   Patient palpated a right breast lump x74yr and a left breast lump x8yr. Diagnostic mammogram and US  showed in the right breast, a 2.7cm mass 5cm from the nipple and 0.8cm mass 1cm from the nipple at the 12:30 position, with borderline cortical thickening in the right axilla, and in the left breast, a 4.0cm mass at the 11 o'clock position representing a hamartoma. 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   09/13/2020 - 01/18/2021 Chemotherapy   Neoadjuvant chemotherapy with [facility] Perjeta        02/02/2021 Surgery   Right lumpectomy ([facility]): no residual carcinoma, 4 right axillary lymph nodes negative for carcinoma.   02/21/2021 - 08/30/2021 Chemotherapy      Patient is on Antibody Plan: BREAST TRASTUZUMAB  + PERTUZUMAB  Q21D     03/11/2021 - 04/11/2021 Radiation Therapy   Adjuvant radiation   09/2021 -  Anti-estrogen oral therapy   Anastrozole  daily     CHIEF COMPLIANT: Surveillance of breast cancer on anastrozole  therapy  HISTORY OF PRESENT ILLNESS:  History of Present Illness Amanda Rich is a 57 year old female with breast cancer  who presents with concerns about weight loss and fear of cancer recurrence.  She has experienced significant weight loss, decreasing from 130 pounds to 116 pounds, due to a loss of appetite associated with a recent depressive episode. She is actively trying to gain weight by making smoothies with bananas, almond milk, and protein powder.  She has ongoing fear and anxiety about the recurrence of her breast cancer, despite being on Arimidex  (anastrozole ) and having regular mammograms that have shown no signs of cancer. Her friend's experience with cancer has heightened her fears.  She is currently taking Remeron  to aid with sleep, as she experiences difficulty sleeping without it. She reports tolerating Arimidex  well with no issues.     ALLERGIES:  is allergic to clarithromycin , lyrica  [pregabalin ], tape, and ivp dye [iodinated contrast media].  MEDICATIONS:  Current Outpatient Medications  Medication Sig Dispense Refill   albuterol  (PROAIR  HFA) 108 (90 Base) MCG/ACT inhaler Inhale 2 puffs into the lungs every 6 (six) hours as needed for wheezing or shortness of breath. 18 g 4   anastrozole  (ARIMIDEX ) 1 MG tablet Take 1 tablet (1 mg total) by mouth daily. 90 tablet 3   aspirin  81 MG chewable tablet Chew 1 tablet (81 mg total) by mouth daily. 30 tablet 11   budesonide -formoterol  (SYMBICORT ) 160-4.5 MCG/ACT inhaler Inhale 2 puffs into the lungs in the morning and at bedtime. 10.2 g 11   Calcium-Vitamin D -Vitamin K (VIACTIV PO) Take 1 tablet by mouth daily.     Cholecalciferol  25 MCG (1000 UT) tablet Take 2 tablets (2,000 Units total) by mouth daily. 90 tablet 0  gabapentin  (NEURONTIN ) 300 MG capsule Take 1 capsule (300 mg total) by mouth 2 (two) times daily. 180 capsule 3   loratadine  (CLARITIN ) 10 MG tablet Take 1 tablet (10 mg total) by mouth daily. 30 tablet 0   magnesium  oxide (MAG-OX) 400 (240 Mg) MG tablet Take 1 tablet (400 mg total) by mouth at bedtime. 30 tablet 11   mirtazapine   (REMERON ) 7.5 MG tablet Take 1 tablet (7.5 mg total) by mouth at bedtime. 30 tablet 3   Tiotropium Bromide  Monohydrate (SPIRIVA  RESPIMAT) 2.5 MCG/ACT AERS Inhale 2 puffs into the lungs daily. 4 g 11   triamcinolone  (KENALOG ) 0.025 % ointment Apply 1 Application topically 2 (two) times daily. 30 g 0   triamcinolone  cream (KENALOG ) 0.1 % Apply 1 Application topically 2 (two) times daily for 2 weeks then stop. Can use again for flares. Do not put on face. 454 g 2   No current facility-administered medications for this visit.    PHYSICAL EXAMINATION: ECOG PERFORMANCE STATUS: 1 - Symptomatic but completely ambulatory  Vitals:   07/03/24 1103  BP: 106/75  Pulse: 68  Resp: 16  Temp: 98.5 F (36.9 C)  SpO2: 100%   Filed Weights   07/03/24 1103  Weight: 116 lb 6.4 oz (52.8 kg)    Physical Exam MEASUREMENTS: Weight- 116.  (exam performed in the presence of a chaperone)  LABORATORY DATA:  I have reviewed the data as listed    Latest Ref Rng & Units 11/02/2022    8:06 AM 03/15/2022    5:30 AM 03/07/2022    1:31 PM  CMP  Glucose 70 - 99 mg/dL 831  86  87   BUN 6 - 20 mg/dL 15  11  6    Creatinine 0.44 - 1.00 mg/dL 9.49  9.43  9.37   Sodium 135 - 145 mmol/L 142  138  139   Potassium 3.5 - 5.1 mmol/L 3.6  3.6  3.8   Chloride 98 - 111 mmol/L 106  102  106   CO2 22 - 32 mmol/L  26  26   Calcium 8.9 - 10.3 mg/dL  9.0  9.5   Total Protein 6.5 - 8.1 g/dL  5.6    Total Bilirubin 0.3 - 1.2 mg/dL  0.5    Alkaline Phos 38 - 126 U/L  41    AST 15 - 41 U/L  22    ALT 0 - 44 U/L  23      Lab Results  Component Value Date   WBC 7.8 03/15/2022   HGB 13.3 11/02/2022   HCT 39.0 11/02/2022   MCV 86.4 03/15/2022   PLT 246 03/15/2022   NEUTROABS 4.4 03/15/2022    ASSESSMENT & PLAN:  Malignant neoplasm of 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, estrogen receptor positive (HCC) Patient palpated a right breast lump x27yr and a left breast lump x39yr. Diagnostic mammogram and US  showed in  the right breast, a 2.7cm mass 5cm from the nipple and 0.8cm mass 1cm from the nipple at the 12:30 position, with borderline cortical thickening in the right axilla, and in the left breast, a 4.0cm mass at the 11 o'clock position representing a hamartoma. 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   Treatment plan: 1. Neoadjuvant chemotherapy with [facility] Perjeta  6 cycles completed 01/18/2021 followed by Herceptin  Perjeta  maintenance for 1  year completed 08/30/2021 2. Followed by breast conserving surgery if possible with sentinel lymph node study 02/02/2021: Complete pathologic response, 0/4 lymph nodes negative 3. Followed by adjuvant radiation therapy 03/14/21- 04/08/21 4.  Followed by adjuvant antiestrogen therapy with letrozole  started 06/28/2021, changed to anastrozole   ------------------------------------------------------------------------------------------------------------------------------------------- Current treatment: Anastrozole     CT Neck  and Chest: 05/20/22: Benign Anastrozole  Toxicities: Hot flashes (better if she drinks less coffee)   Breast cancer surveillance: Mammograms: 03/12/2024: Benign, breast density category C Breast exam 07/03/2024: Benign (redness beneath the right breast appears to be inflammation: Patient has triamcinolone  ointment encouraged her to use it on the skin)   Scalp nodules: I sent a referral to dermatology. Submandibular swelling: ED visit 01/13/2024: Recommend dedicated CT scan. Vibration sensation of the breast intermittently: I did not palpate any concerning findings.  It must be scar tissue related sensation. I instructed her to get a mammogram in March and I put the orders for that.   She has a [employer] he and she provides resources for breast cancer survivors. Return to clinic at her scheduled  appointment in July.   ------------------------------------- Assessment and Plan Assessment & Plan Malignant neoplasm of upper-inner quadrant of right breast Estrogen receptor-positive breast cancer. Anastrozole  well-tolerated. Recent mammograms show no recurrence. Low recurrence risk discussed. Gardant blood test option introduced for recurrence detection, covered by Medicare. - Continue anastrozole  (Arimidex ) 1 MG Oral daily. - Order Gardant blood test every six months. - Schedule bone density test within the next couple of months.  Depression Depression exacerbated by emotional impact of a friend's cancer recurrence. Mirtazapine  necessary for sleep. - Continue mirtazapine  (Remeron ) 7.5 MG Oral at bedtime.  Insomnia Insomnia related to anxiety and depression. Mirtazapine  aids sleep. - Continue mirtazapine  (Remeron ) 7.5 MG Oral at bedtime.  General Health Maintenance Behind on immunizations, specifically pneumonia and hepatitis series. Discussed vaccine differences. - Consult with primary care physician regarding vaccine schedule. - Consider pneumonia and hepatitis series vaccinations.      Orders Placed This Encounter  Procedures   DG Bone Density    Standing Status:   Future    Expected Date:   10/03/2024    Expiration Date:   07/03/2025    Reason for Exam (SYMPTOM  OR DIAGNOSIS REQUIRED):   Post menopausal    Is patient pregnant?:   No    Preferred imaging location?:   [facility]    Release to patient:   Immediate   The patient has a good understanding of the overall plan. she agrees with it. she will call with any problems that may develop before the next visit here. Total time spent: 30 mins including face to face time and time spent for planning, charting and co-ordination of care   Viinay K Harli Engelken, MD 07/03/24

## 2024-07-07 ENCOUNTER — Telehealth: Payer: Self-pay

## 2024-07-07 ENCOUNTER — Encounter: Attending: Physical Medicine and Rehabilitation | Admitting: Physical Medicine and Rehabilitation

## 2024-07-07 ENCOUNTER — Encounter: Payer: Self-pay | Admitting: Physical Medicine and Rehabilitation

## 2024-07-07 VITALS — BP 119/81 | HR 77 | Ht 65.0 in | Wt 116.0 lb

## 2024-07-07 DIAGNOSIS — L989 Disorder of the skin and subcutaneous tissue, unspecified: Secondary | ICD-10-CM | POA: Insufficient documentation

## 2024-07-07 DIAGNOSIS — F32A Depression, unspecified: Secondary | ICD-10-CM | POA: Diagnosis present

## 2024-07-07 DIAGNOSIS — I639 Cerebral infarction, unspecified: Secondary | ICD-10-CM | POA: Insufficient documentation

## 2024-07-07 DIAGNOSIS — R4789 Other speech disturbances: Secondary | ICD-10-CM | POA: Insufficient documentation

## 2024-07-07 DIAGNOSIS — R630 Anorexia: Secondary | ICD-10-CM | POA: Insufficient documentation

## 2024-07-07 NOTE — Telephone Encounter (Signed)
 Per md orders entered for [employer] and all supported documents faxed to [phone]. Faxed confirmation was received.

## 2024-07-07 NOTE — Progress Notes (Signed)
 Subjective:    Patient ID: Amanda Rich, female    DOB: [birth year], 58 y.o.   MRN: [id number]  HPI Amanda Rich is a 58 year old woman who presents for follow-up for subcortical infarction and neuropathy  1) Subcortical infarction -doing well -asks if this is the best she can be Left side weaker -trying to sleep more and exercise -having bruising from the brillinta -she was not told how long she should take the brillinta for  -her disability was just approved -she used to enjoy work and has been so used to working.  -she follows with NSGY and has arteriogram scheduled October.  -she did not like her OT, she likes PT  2) decrease in appetite -lost weight from 130 lbs to 123 lbs to 117 lbs -felt better at a higher weight.  -discussed hat this has improved -she went through fear after her friend died  3) depression: -restarted celexa  -she feels that appetite is poor due to this -has started volunteering, she has been working at [employer] -she had completely stopped celexa  instead of weaning -she mises her work but does not feel she can stand that long, she feels she as nothing to do -she would like to start working out -she would like to find part time work.   4) neuropathy -she takes gabapentin  twice per day -feels like she is walking on nails -she can only take gabapentin  once per day because it makes her loopy  5) Insomnia: -celexa  and gabapenitn did not help -sleeps at 5:30/6 in the morning   Pain Inventory Average Pain 6 Pain Right Now 0 My pain is sharp, burning, and tingling  LOCATION OF PAIN  fingers, leg, toes  BOWEL Number of stools per week: 14 plus   BLADDER Normal    Mobility  Cane Climbs stairs drive Function disabled: date disabled . 07/2020  Neuro/Psych weakness numbness tingling trouble walking spasms  Prior Studies Any changes since last visit?  yes Mammogram   Family History  Problem Relation Age of Onset   Diabetes  Mother    Hypertension Mother    Liver cancer Daughter 44   Diabetes Maternal Aunt    Breast cancer Maternal Aunt 44   Colon cancer Maternal Uncle 70       or prostate cancer   Colon cancer Cousin    Breast cancer Cousin    Autism Half-Brother    Breast cancer Other 61       maternal second cousin   Diabetes Paternal Uncle    Breast cancer Other        dx. 27s, paternal second cousin   Lung cancer Other        dx. 30s, paternal cousin once removed (father's cousin)   Social History   Socioeconomic History   Marital status: Divorced    Spouse name: Not on file   Number of children: 3   Years of education: Not on file   Highest education level: Associate degree: academic program  Occupational History   Not on file  Tobacco Use   Smoking status: Every Day    Current packs/day: 1.00    Average packs/day: 1 pack/day for 38.5 years (38.5 ttl pk-yrs)    Types: Cigarettes    Start date: 12/25/1985   Smokeless tobacco: Never   Tobacco comments:    Used Nicotine  Patch 03/06/22.  Vaping Use   Vaping status: Never Used  Substance and Sexual Activity   Alcohol use: Not Currently  Comment: rarely   Drug use: No   Sexual activity: Not Currently    Birth control/protection: Post-menopausal  Other Topics Concern   Not on file  Social History Narrative   Not on file   Social Drivers of Health   Financial Resource Strain: Low Risk  (07/01/2024)   Overall Financial Resource Strain (CARDIA)    Difficulty of Paying Living Expenses: Not very hard  Food Insecurity: No Food Insecurity (07/01/2024)   Hunger Vital Sign    Worried About Running Out of Food in the Last Year: Never true    Ran Out of Food in the Last Year: Never true  Transportation Needs: Unknown (07/01/2024)   PRAPARE - Administrator, Civil Service (Medical): No    Lack of Transportation (Non-Medical): Not on file  Physical Activity: Insufficiently Active (07/01/2024)   Exercise Vital Sign    Days of Exercise  per Week: 3 days    Minutes of Exercise per Session: 40 min  Stress: Stress Concern Present (07/01/2024)   [employer] of Occupational Health - Occupational Stress Questionnaire    Feeling of Stress: Rather much  Social Connections: Socially Integrated (07/01/2024)   Social Connection and Isolation Panel    Frequency of Communication with Friends and Family: More than three times a week    Frequency of Social Gatherings with Friends and Family: Once a week    Attends Religious Services: More than 4 times per year    Active Member of [employer] or Organizations: Yes    Attends Engineer, structural: More than 4 times per year    Marital Status: Living with partner   Past Surgical History:  Procedure Laterality Date   ABLATION ON ENDOMETRIOSIS     BREAST BIOPSY Right    BREAST LUMPECTOMY Right 02/02/2021   BREAST LUMPECTOMY WITH RADIOACTIVE SEED AND SENTINEL LYMPH NODE BIOPSY Right 02/02/2021   Procedure: RIGHT BREAST LUMPECTOMY WITH RADIOACTIVE SEED AND RIGHT SENTINEL LYMPH NODE MAPPING;  Surgeon: Vanderbilt Ned, MD;  Location: [facility];  Service: General;  Laterality: Right;   GANGLION CYST EXCISION  12/25/2005   L wrist; [facility], [place]   IR ANGIO INTRA EXTRACRAN SEL INTERNAL CAROTID BILAT MOD SED  02/06/2022   IR ANGIO INTRA EXTRACRAN SEL INTERNAL CAROTID UNI R MOD SED  03/10/2022   IR ANGIO INTRA EXTRACRAN SEL INTERNAL CAROTID UNI R MOD SED  11/02/2022   IR ANGIO VERTEBRAL SEL VERTEBRAL BILAT MOD SED  02/06/2022   IR ANGIOGRAM FOLLOW UP STUDY  03/14/2022   IR IMAGING GUIDED PORT INSERTION  09/10/2020   IR NEURO EACH ADD'L AFTER BASIC UNI RIGHT (MS)  03/14/2022   IR TRANSCATH/EMBOLIZ  03/10/2022   RADIOLOGY WITH ANESTHESIA N/A 03/10/2022   Procedure: 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 Lynwood Nasuti   Pericarditis    Personal history of chemotherapy    Personal history of radiation therapy    Smoker  Umbilical hernia    Ht 5' 5 (1.651 m)   Wt 116 lb (52.6 kg)   LMP 10/15/2017 (Approximate)   BMI 19.30 kg/m   Opioid Risk Score:   Fall Risk Score:  `1  Depression screen [facility] 2/9     07/07/2024   10:19 AM 06/18/2024    3:00 PM 12/07/2023   10:16 AM 10/09/2023    4:57 PM 06/13/2023    1:41 PM 05/25/2023    1:12 PM 03/02/2023    8:13 AM  Depression screen PHQ 2/9  Decreased Interest 0 0 0 0 0 1 0  Down, Depressed, Hopeless 0 0 0 0 0 0 0  PHQ - 2 Score 0 0 0 0 0 1 0  Altered sleeping  0  3 2    Tired, decreased energy  0  3 2    Change in appetite  0  0 0    Feeling bad or failure about yourself   0  0 0    Trouble concentrating  0  0 0    Moving slowly or fidgety/restless  2  0 0    Suicidal thoughts  0  0 0    PHQ-9 Score  2  6 4     Difficult doing work/chores  Not difficult at all           Review of Systems  Constitutional:  Negative for appetite change.  Respiratory:  Positive for shortness of breath.   Musculoskeletal:  Positive for gait problem.       Fatigue with walking spasms  Skin:        Random bruising  Neurological:  Positive for weakness and numbness.  All other systems reviewed and are negative.      Objective:   Physical Exam Gen: no distress, normal appearing, weight 116 lbs, BMI 19.30 HEENT: oral mucosa pink and moist, NCAT Cardio: Reg rate Chest: normal effort, normal rate of breathing Abd: soft, non-distended Ext: no edema Psych: pleasant, normal affect Skin: intact Neuro: Alert and oriented x3, left side strength 4/5, 5/5 in right, improved  memory MSK: improved ambulation       Assessment & Plan:   1) CVA -continue HEP -continue going to gym -discussed that she is sleeping better -would benefit from handicap placard to increase mobility in the community -reviewed all medications and provided necessary refills. -continue use of cane -discussed choosing anti-inflammatory foods -recommended decreasing sugar as much as possible.   2) Bruising -discussed that this could be from the [place], recommended following up with surgeon regarding duraiton of Brillinta  3) Brain fog -encouraged continued exercise -encouraged walnuts, blueberries, salmons -can consider Cefazolin  NAC -discussed that more noticeable when she is tired  4) Insomnia: -discussed mirtazepine 7.5mg  at night -continue vitamin D  2,000 -continue magnesium  -Try to go outside near sunrise -Get exercise during the day.  -Turn off all devices an hour before bedtime.  -Teas that can benefit: chamomile, valerian root, Brahmi (Bacopa) -Can consider over the counter melatonin, magnesium , and/or L-theanine. Melatonin is an anti-oxidant with multiple health benefits. Magnesium  is involved in greater than 300 enzymatic reactions in the body and most of us  are deficient as our soil is often depleted. There are 7 different types of magnesium - Bioptemizer's is a supplement with all 7 types, and each has unique benefits. Magnesium  can also help with constipation and anxiety.  -Pistachios naturally increase the production of melatonin -Cozy Earth bamboo bed sheets are free from toxic chemicals.  -Tart cherry  juice or a tart cherry supplement can improve sleep and soreness post-workout     5) Neuropathy Prescribed Zynex Nexwave  Turmeric to reduce inflammation--can be used in cooking or taken as a supplement.  Benefits of turmeric:  -Highly anti-inflammatory  -Increases antioxidants  -Improves memory, attention, brain disease  -Lowers risk of heart  disease  -May help prevent cancer  -Decreases pain  -Alleviates depression  -Delays aging and decreases risk of chronic disease  -Consume with black pepper to increase absorption    Turmeric Milk Recipe:  1 cup milk  1 tsp turmeric  1 tsp cinnamon  1 tsp grated ginger (optional)  Black pepper (boosts the anti-inflammatory properties of turmeric).  1 tsp honey  -Discussed current symptoms of pain and history of pain.  -Discussed benefits of exercise in reducing pain. -Discussed following foods that may reduce pain: 1) Ginger (especially studied for arthritis)- reduce leukotriene production to decrease inflammation 2) Blueberries- high in phytonutrients that decrease inflammation 3) Salmon- marine omega-3s reduce joint swelling and pain 4) Pumpkin seeds- reduce inflammation 5) dark chocolate- reduces inflammation 6) turmeric- reduces inflammation 7) tart cherries - reduce pain and stiffness 8) extra virgin olive oil - its compound olecanthal helps to block prostaglandins  9) chili peppers- can be eaten or applied topically via capsaicin 10) mint- helpful for headache, muscle aches, joint pain, and itching 11) garlic- reduces inflammation  Link to further information on diet for chronic pain: http://www.bray.com/   6) Word finding difficulty: -recommended avoiding activities that cause fatigue -discussed that this has improved  7) Skin lesion:  -discussed that she followed up with dermatology -discussed that she was 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

## 2024-07-10 ENCOUNTER — Encounter: Payer: Self-pay | Admitting: Nurse Practitioner

## 2024-07-10 ENCOUNTER — Ambulatory Visit (INDEPENDENT_AMBULATORY_CARE_PROVIDER_SITE_OTHER): Payer: 59 | Admitting: Nurse Practitioner

## 2024-07-10 VITALS — BP 112/84 | HR 71 | Ht 65.0 in | Wt 118.8 lb

## 2024-07-10 DIAGNOSIS — F17210 Nicotine dependence, cigarettes, uncomplicated: Secondary | ICD-10-CM | POA: Diagnosis not present

## 2024-07-10 DIAGNOSIS — J4489 Other specified chronic obstructive pulmonary disease: Secondary | ICD-10-CM

## 2024-07-10 NOTE — Patient Instructions (Signed)
 Continue Albuterol  inhaler 2 puffs every 6 hours as needed for shortness of breath or wheezing. Notify if symptoms persist despite rescue inhaler/neb use.  Continue Symbicort  2 puffs Twice daily. Brush tongue and rinse mouth afterwards Continue Spiriva  2 puffs daily  Guaifenesin  (Mucinex ) 600 mg Twice daily for cough/congestion    Work on quitting smoking with nicotine  patches again - call 1-800-QUITNOW line    Repeat CT with lung cancer screening program in October 2025. Call if you haven't heard from them regarding scheduling come September. [phone]   Follow up in 6 months with Dr. Annella or Izetta Ethelle Ola,NP. If symptoms worsen, please contact office for sooner follow up or seek emergency care.

## 2024-07-10 NOTE — Assessment & Plan Note (Addendum)
 See above. Continue lung cancer screening as scheduled

## 2024-07-10 NOTE — Progress Notes (Signed)
 @Patient  ID: Amanda Rich, female    DOB: [birth year], 58 y.o.   MRN: [id number]  Chief Complaint  Patient presents with   Follow-up    Cigratte smoking. Patient states no improvement in sob.Inhaler help but still gets out of breath.    Referring provider: Lendia Boby CROME, NP-C  HPI: 58 year old female, active smoker followed for COPD/asthma and DOE.  She is patient Dr. Leatha and last seen in 01/11/2024 by [facility] NP.  Past medical history significant for pericarditis, carotid aneurysm, cerebral aneurysm, chemotherapy-induced neuropathy, stage IIa right breast cancer, HLD, depression.  TEST/EVENTS:  07/07/2021 PFT: FVC 76, FEV1 55, ratio 58, TLC 121, DLCO 67.  Moderate obstructive airway disease with reversibility (27% change).  Inflated lung volumes, indicative of air trapping and moderate diffusing defect 07/08/2021 echo: EF 60 to 65%.  RV size and function normal.  Normal PASP.  No valvular abnormalities. 05/18/2022 CT chest with contrast: 5 mm hypodensity in right lobe of thyroid .  Mild emphysematous changes.  Subpleural fibrotic changes in the anterior aspect of the right upper lobe, possible sequela of radiation therapy.  Postsurgical changes in right breast.  Mild gastric wall thickening. 10/17/2023 LDCT lung cancer screen: atherosclerosis. Moderate centrilobular emphysema. Isolated lingular nodule or mucoid impaction at 3.7 mm. Lung RADS 2.   06/19/2022: OV with Dr. Annella.  Symptoms felt to be related to asthma.  Most recent oncology note reviewed.  Discharge summary following embolization of right internal carotid artery.  Breathing has improved but slightly worse over the last month or 2.  Continues high-dose Symbicort  and Spiriva .  Restarted smoking again due to stress of the aneurysm.  Smoking up to a pack a day.  Successful in the past with patches.  Advised to slowly increase physical activity to help with deconditioning.  Continued current inhaler regimen.  Smoking cessation  advised.  09/11/2023: OV with Amulya Quintin NP for overdue follow-up.  She tells me that she has been relatively stable over the last year or so.  She has not any exacerbation to running steroids or antibiotics.  She did run out of her Spiriva  a little over a month ago.  She feels like since she has been off of this, her breathing feels a little more short winded with certain activities.  She has an occasional cough; clear phlegm. Some wheezing if she does a lot of activity. Gets short of breath with distance walking and stairs.  Able to complete ADLs and activities around the house without much difficulty.  Rarely uses her rescue inhaler.  Denies any fevers, chills, chest congestion, hemoptysis, lower extremity swelling, orthopnea, palpitations.  Still using her Symbicort  twice a day.  She is smoking again; up to 1 pack/day.  Wants to quit again.  She has used nicotine  patches in the past successfully.  01/11/2024: OV with Sybol Morre NP for follow-up.  She has been doing relatively well since she was here last time.  She did restart the Spiriva .  She does feel like the combination of the Symbicort  and the Spiriva  helped her.  She still uses her albuterol  once a day.  She did run out of this so she has been using her Symbicort  midday if she is doing chores around the house.  Does not have any increased cough, chest congestion or wheezing.  When she does cough, she tends to get up clear phlegm.  Able to do things around the house and grocery shop on her own.  She is still smoking.  Nicotine  patches were  helping but they are expensive. Had a recent lung cancer screening CT in October 2024 without any suspicious nodules or masses.  She denies any hemoptysis, anorexia, weight loss, night sweats.  07/10/2024: Today - follow up Discussed the use of AI scribe software for clinical note transcription with the patient, who gave verbal consent to proceed.  History of Present Illness   Amanda Rich is a 58 year old female with  COPD and asthma who presents for follow up.  She has been experiencing a cough and increased mucus production for the past one to two months. The mucus is yellow in color. No fever, hemoptysis, or chest pain is associated with the cough, and the symptoms have not worsened over time. Breathing feels stable or even improved from prior visit. No issues with wheezing.   She smokes half a pack of cigarettes per day and is attempting to quit smoking by finding smoke-free environments. She notes the cough occurred following her increase in smoking.   She occasionally uses her rescue inhaler, particularly on hot and humid days, few times a week. Uses symbicort  and spiriva  daily.       Allergies  Allergen Reactions   Clarithromycin  Swelling    Biaxin    Lyrica  [Pregabalin ]     Oversedated    Tape     Rash from Tegaderm   Ivp Dye [Iodinated Contrast Media] Rash    Immunization History  Administered Date(s) Administered   Influenza, Seasonal, Injecte, Preservative Fre 01/25/2024   Influenza,inj,Quad PF,6+ Mos 10/25/2018, 10/04/2020, 01/31/2023   PFIZER Comirnaty(Gray Top)Covid-19 Tri-Sucrose Vaccine 06/28/2021   PFIZER(Purple Top)SARS-COV-2 Vaccination 03/25/2020, 04/19/2020   Pfizer Covid-19 Vaccine Bivalent Booster 64yrs & up 12/08/2021   Pfizer(Comirnaty)Fall Seasonal Vaccine 12 years and older 01/31/2023   Tdap 09/22/2016    Past Medical History:  Diagnosis Date   Anemia    Asthma    Cancer (HCC)    breast cancer   Depression    Dyspnea    Family history of breast cancer    Family history of liver cancer    History of radiation therapy 03/10/21-04/11/21   Right breast- Dr. Lynwood Nasuti   Pericarditis    Personal history of chemotherapy    Personal history of radiation therapy    Smoker    Umbilical hernia     Tobacco History: Social History   Tobacco Use  Smoking Status Every Day   Current packs/day: 1.00   Average packs/day: 1 pack/day for 38.5 years (38.5 ttl  pk-yrs)   Types: Cigarettes   Start date: 12/25/1985  Smokeless Tobacco Never  Tobacco Comments   Used Nicotine  Patch 03/06/22.   Ready to quit: Not Answered Counseling given: Not Answered Tobacco comments: Used Nicotine  Patch 03/06/22.   Outpatient Medications Prior to Visit  Medication Sig Dispense Refill   albuterol  (PROAIR  HFA) 108 (90 Base) MCG/ACT inhaler Inhale 2 puffs into the lungs every 6 (six) hours as needed for wheezing or shortness of breath. 18 g 4   anastrozole  (ARIMIDEX ) 1 MG tablet Take 1 tablet (1 mg total) by mouth daily. 90 tablet 3   aspirin  81 MG chewable tablet Chew 1 tablet (81 mg total) by mouth daily. 30 tablet 11   budesonide -formoterol  (SYMBICORT ) 160-4.5 MCG/ACT inhaler Inhale 2 puffs into the lungs in the morning and at bedtime. 10.2 g 11   Calcium-Vitamin D -Vitamin K (VIACTIV PO) Take 1 tablet by mouth daily.     Cholecalciferol  25 MCG (1000 UT) tablet Take 2 tablets (2,000 Units  total) by mouth daily. 90 tablet 0   gabapentin  (NEURONTIN ) 300 MG capsule Take 1 capsule (300 mg total) by mouth 2 (two) times daily. 180 capsule 3   loratadine  (CLARITIN ) 10 MG tablet Take 1 tablet (10 mg total) by mouth daily. 30 tablet 0   magnesium  oxide (MAG-OX) 400 (240 Mg) MG tablet Take 1 tablet (400 mg total) by mouth at bedtime. 30 tablet 11   mirtazapine  (REMERON ) 7.5 MG tablet Take 1 tablet (7.5 mg total) by mouth at bedtime. 30 tablet 3   Tiotropium Bromide  Monohydrate (SPIRIVA  RESPIMAT) 2.5 MCG/ACT AERS Inhale 2 puffs into the lungs daily. 4 g 11   triamcinolone  (KENALOG ) 0.025 % ointment Apply 1 Application topically 2 (two) times daily. 30 g 0   triamcinolone  cream (KENALOG ) 0.1 % Apply 1 Application topically 2 (two) times daily for 2 weeks then stop. Can use again for flares. Do not put on face. 454 g 2   No facility-administered medications prior to visit.     Review of Systems:   Constitutional: No weight loss or gain, night sweats, fevers, chills, fatigue,  or lassitude. HEENT: No headaches, difficulty swallowing, tooth/dental problems, or sore throat. No sneezing, itching, ear ache, nasal congestion, or post nasal drip CV:  No chest pain, orthopnea, PND, swelling in lower extremities, anasarca, dizziness, palpitations, syncope Resp: +shortness of breath with exertion; chronic cough with yellow phlegm. No excess mucus or change in color of mucus.  No wheeze. No hemoptysis. No chest wall deformity GI:  No heartburn, indigestion, abdominal pain, nausea, vomiting, diarrhea, change in bowel habits, loss of appetite, bloody stools.  Skin: No rash, lesions, ulcerations MSK:  No joint pain or swelling.   Neuro: No dizziness or lightheadedness.  Psych: No depression or anxiety. Mood stable.     Physical Exam:  BP 112/84 (BP Location: Right Arm, Patient Position: Sitting, Cuff Size: Normal)   Pulse 71   Ht 5' 5 (1.651 m)   Wt 118 lb 12.8 oz (53.9 kg)   LMP 10/15/2017 (Approximate)   SpO2 96%   BMI 19.77 kg/m   GEN: Pleasant, interactive, well-appearing; in no acute distress HEENT:  Normocephalic and atraumatic. PERRLA. Sclera white. Nasal turbinates pink, moist and patent bilaterally. No rhinorrhea present. Oropharynx pink and moist, without exudate or edema. No lesions, ulcerations, or postnasal drip.  NECK:  Supple w/ fair ROM.  No lymphadenopathy.   CV: RRR, no m/r/g, no peripheral edema. Pulses intact, +2 bilaterally. No cyanosis, pallor or clubbing. PULMONARY:  Unlabored, regular breathing. Clear bilaterally A&P w/o wheezes/rales/rhonchi. No accessory muscle use.  GI: BS present and normoactive. Soft, non-tender to palpation.  MSK: No erythema, warmth or tenderness. Cap refil <2 sec all extrem.  Neuro: A/Ox3. No focal deficits noted.   Skin: Warm, no lesions or rashe Psych: Normal affect and behavior. Judgement and thought content appropriate.     Lab Results:  CBC    Component Value Date/Time   WBC 7.8 03/15/2022 0530   RBC 3.69  (L) 03/15/2022 0530   HGB 13.3 11/02/2022 0806   HGB 12.4 10/27/2021 1447   HCT 39.0 11/02/2022 0806   PLT 246 03/15/2022 0530   PLT 200 10/27/2021 1447   MCV 86.4 03/15/2022 0530   MCH 29.0 03/15/2022 0530   MCHC 33.5 03/15/2022 0530   RDW 13.6 03/15/2022 0530   LYMPHSABS 2.5 03/15/2022 0530   MONOABS 0.8 03/15/2022 0530   EOSABS 0.0 03/15/2022 0530   BASOSABS 0.0 03/15/2022 0530    BMET  Component Value Date/Time   NA 142 11/02/2022 0806   K 3.6 11/02/2022 0806   CL 106 11/02/2022 0806   CO2 26 03/15/2022 0530   GLUCOSE 168 (H) 11/02/2022 0806   BUN 15 11/02/2022 0806   CREATININE 0.50 11/02/2022 0806   CREATININE 0.76 10/27/2021 1447   CREATININE 0.68 09/24/2017 0827   CALCIUM 9.0 03/15/2022 0530   GFRNONAA >60 03/15/2022 0530   GFRNONAA >60 10/27/2021 1447   GFRNONAA >89 09/22/2016 1501   GFRAA >60 09/20/2020 1415   GFRAA >89 09/22/2016 1501    BNP    Component Value Date/Time   BNP 44.2 12/26/2018 0646     Imaging:  No results found.  Administration History     None          Latest Ref Rng & Units 07/07/2021   10:46 AM  PFT Results  FVC-Pre L 2.27   FVC-Predicted Pre % 76   FVC-Post L 2.89   FVC-Predicted Post % 97   Pre FEV1/FVC % % 58   Post FEV1/FCV % % 58   FEV1-Pre L 1.32   FEV1-Predicted Pre % 55   FEV1-Post L 1.68   DLCO uncorrected ml/min/mmHg 13.85   DLCO UNC% % 64   DLCO corrected ml/min/mmHg 14.57   DLCO COR %Predicted % 67   DLVA Predicted % 65   TLC L 6.30   TLC % Predicted % 121   RV % Predicted % 173     No results found for: NITRICOXIDE      Assessment & Plan:   COPD with asthma Improved dyspnea; productive cough seems to be exacerbated by smoking. Does not appear to be in acute exacerbation. Lung exam clear. Mucociliary clearance therapies recommended. No hospitalizations. Smoking cessation strongly advised. Continue maintenance regimen. Will notify if symptoms fail to improve or worsen. Encouraged to  remain active. Action plan in place.  Patient Instructions  Continue Albuterol  inhaler 2 puffs every 6 hours as needed for shortness of breath or wheezing. Notify if symptoms persist despite rescue inhaler/neb use.  Continue Symbicort  2 puffs Twice daily. Brush tongue and rinse mouth afterwards Continue Spiriva  2 puffs daily  Guaifenesin  (Mucinex ) 600 mg Twice daily for cough/congestion    Work on quitting smoking with nicotine  patches again - call 1-800-QUITNOW line    Repeat CT with lung cancer screening program in October 2025. Call if you haven't heard from them regarding scheduling come September. [phone]   Follow up in 6 months with Dr. Annella or Izetta Seena Ritacco,NP. If symptoms worsen, please contact office for sooner follow up or seek emergency care.   Cigarette smoker See above. Continue lung cancer screening as scheduled    I spent 35 minutes of dedicated to the care of this patient on the date of this encounter to include pre-visit review of records, face-to-face time with the patient discussing conditions above, post visit ordering of testing, clinical documentation with the electronic health record, making appropriate referrals as documented, and communicating necessary findings to members of the patients care team.  Comer LULLA Rouleau, NP 07/10/2024  Pt aware and understands NP's role.

## 2024-07-10 NOTE — Assessment & Plan Note (Signed)
 Improved dyspnea; productive cough seems to be exacerbated by smoking. Does not appear to be in acute exacerbation. Lung exam clear. Mucociliary clearance therapies recommended. No hospitalizations. Smoking cessation strongly advised. Continue maintenance regimen. Will notify if symptoms fail to improve or worsen. Encouraged to remain active. Action plan in place.  Patient Instructions  Continue Albuterol  inhaler 2 puffs every 6 hours as needed for shortness of breath or wheezing. Notify if symptoms persist despite rescue inhaler/neb use.  Continue Symbicort  2 puffs Twice daily. Brush tongue and rinse mouth afterwards Continue Spiriva  2 puffs daily  Guaifenesin  (Mucinex ) 600 mg Twice daily for cough/congestion    Work on quitting smoking with nicotine  patches again - call 1-800-QUITNOW line    Repeat CT with lung cancer screening program in October 2025. Call if you haven't heard from them regarding scheduling come September. [phone]   Follow up in 6 months with Dr. Annella or Izetta Josseline Reddin,NP. If symptoms worsen, please contact office for sooner follow up or seek emergency care.

## 2024-07-16 ENCOUNTER — Other Ambulatory Visit: Payer: Self-pay

## 2024-07-16 ENCOUNTER — Other Ambulatory Visit: Payer: Self-pay | Admitting: Medical Genetics

## 2024-07-31 ENCOUNTER — Telehealth: Payer: Self-pay | Admitting: *Deleted

## 2024-07-31 DIAGNOSIS — C50211 Malignant neoplasm of upper-inner quadrant of right female breast: Secondary | ICD-10-CM | POA: Diagnosis not present

## 2024-07-31 NOTE — Telephone Encounter (Signed)
 Per MD request RN placed call to pt with recent [employer] Reveal results being negative.  No answer, unable to LVM due to VM not being set up.

## 2024-08-05 ENCOUNTER — Encounter: Payer: Self-pay | Admitting: Hematology and Oncology

## 2024-08-11 ENCOUNTER — Other Ambulatory Visit (HOSPITAL_COMMUNITY): Payer: Self-pay

## 2024-09-09 ENCOUNTER — Other Ambulatory Visit (HOSPITAL_COMMUNITY): Payer: Self-pay

## 2024-10-07 ENCOUNTER — Ambulatory Visit: Payer: Self-pay

## 2024-10-07 ENCOUNTER — Ambulatory Visit: Admitting: Adult Health

## 2024-10-07 ENCOUNTER — Other Ambulatory Visit (HOSPITAL_COMMUNITY): Payer: Self-pay

## 2024-10-07 VITALS — BP 96/66 | HR 105 | Temp 98.2°F | Ht 65.0 in | Wt 116.4 lb

## 2024-10-07 DIAGNOSIS — J0101 Acute recurrent maxillary sinusitis: Secondary | ICD-10-CM

## 2024-10-07 DIAGNOSIS — Z720 Tobacco use: Secondary | ICD-10-CM | POA: Diagnosis not present

## 2024-10-07 DIAGNOSIS — R051 Acute cough: Secondary | ICD-10-CM | POA: Diagnosis not present

## 2024-10-07 MED ORDER — PREDNISONE 10 MG PO TABS
10.0000 mg | ORAL_TABLET | Freq: Every day | ORAL | 0 refills | Status: DC
  Filled 2024-10-07 (×2): qty 5, 5d supply, fill #0

## 2024-10-07 MED ORDER — AMOXICILLIN-POT CLAVULANATE 875-125 MG PO TABS
1.0000 | ORAL_TABLET | Freq: Two times a day (BID) | ORAL | 0 refills | Status: DC
  Filled 2024-10-07 (×2): qty 20, 10d supply, fill #0

## 2024-10-07 NOTE — Progress Notes (Signed)
 Subjective:    Patient ID: Amanda Rich, female    DOB: 02/10/1966, 58 y.o.   MRN: [id number]  HPI  Discussed the use of AI scribe software for clinical note transcription with the patient, who gave verbal consent to proceed.  History of Present Illness   Amanda Rich is a 58 year old female who presents to the office today for an acute issue.   The cough has worsened over the past two days, now accompanied by difficulty breathing. Initially producing clear mucus, it has become thick and colored. She experiences pressure in her head and ears, described as 'weighted.'  She uses Symbicort  and Spiriva  daily, typically in the morning or at night, but has needed more frequent use due to increased difficulty breathing. The inhalers provide some relief but not to the usual extent. She feels panicked and short of breath with minimal exertion, such as getting dressed.  She continues to smoke, albeit less frequently, and is attempting to quit using nicotine  patches. She distinguishes her current cough from a smoker's cough, noting it is productive with mucus.  No fevers, chills, sore throat, or loss of taste. She has not been using any over-the-counter medications aside from her regular medications and some immune-boosting supplements.        Review of Systems See HPI   Past Medical History:  Diagnosis Date   Anemia    Asthma    Cancer (HCC)    breast cancer   Depression    Dyspnea    Family history of breast cancer    Family history of liver cancer    History of radiation therapy 03/10/21-04/11/21   Right breast- Dr. Lynwood Nasuti   Pericarditis    Personal history of chemotherapy    Personal history of radiation therapy    Smoker    Umbilical hernia     Social History   Socioeconomic History   Marital status: Divorced    Spouse name: Not on file   Number of children: 3   Years of education: Not on file   Highest education level: Associate degree: academic program   Occupational History   Not on file  Tobacco Use   Smoking status: Every Day    Current packs/day: 1.00    Average packs/day: 1 pack/day for 38.8 years (38.8 ttl pk-yrs)    Types: Cigarettes    Start date: 12/25/1985   Smokeless tobacco: Never   Tobacco comments:    Used Nicotine  Patch 03/06/22.  Vaping Use   Vaping status: Never Used  Substance and Sexual Activity   Alcohol use: Not Currently    Comment: rarely   Drug use: No   Sexual activity: Not Currently    Birth control/protection: Post-menopausal  Other Topics Concern   Not on file  Social History Narrative   Not on file   Social Drivers of Health   Financial Resource Strain: Low Risk  (07/01/2024)   Overall Financial Resource Strain (CARDIA)    Difficulty of Paying Living Expenses: Not very hard  Food Insecurity: No Food Insecurity (07/01/2024)   Hunger Vital Sign    Worried About Running Out of Food in the Last Year: Never true    Ran Out of Food in the Last Year: Never true  Transportation Needs: Unknown (07/01/2024)   PRAPARE - Administrator, Civil Service (Medical): No    Lack of Transportation (Non-Medical): Not on file  Physical Activity: Insufficiently Active (07/01/2024)   Exercise Vital Sign  Days of Exercise per Week: 3 days    Minutes of Exercise per Session: 40 min  Stress: Stress Concern Present (07/01/2024)   [employer] of Occupational Health - Occupational Stress Questionnaire    Feeling of Stress: Rather much  Social Connections: Socially Integrated (07/01/2024)   Social Connection and Isolation Panel    Frequency of Communication with Friends and Family: More than three times a week    Frequency of Social Gatherings with Friends and Family: Once a week    Attends Religious Services: More than 4 times per year    Active Member of Clubs or Organizations: Yes    Attends Engineer, structural: More than 4 times per year    Marital Status: Living with partner  Intimate Partner  Violence: Not At Risk (06/18/2024)   Humiliation, Afraid, Rape, and Kick questionnaire    Fear of Current or Ex-Partner: No    Emotionally Abused: No    Physically Abused: No    Sexually Abused: No    Past Surgical History:  Procedure Laterality Date   ABLATION ON ENDOMETRIOSIS     BREAST BIOPSY Right    BREAST LUMPECTOMY Right 02/02/2021   BREAST LUMPECTOMY WITH RADIOACTIVE SEED AND SENTINEL LYMPH NODE BIOPSY Right 02/02/2021   Procedure: RIGHT BREAST LUMPECTOMY WITH RADIOACTIVE SEED AND RIGHT SENTINEL LYMPH NODE MAPPING;  Surgeon: Vanderbilt Ned, MD;  Location: [facility];  Service: General;  Laterality: Right;   GANGLION CYST EXCISION  12/25/2005   L wrist; [facility], [place]   IR ANGIO INTRA EXTRACRAN SEL INTERNAL CAROTID BILAT MOD SED  02/06/2022   IR ANGIO INTRA EXTRACRAN SEL INTERNAL CAROTID UNI R MOD SED  03/10/2022   IR ANGIO INTRA EXTRACRAN SEL INTERNAL CAROTID UNI R MOD SED  11/02/2022   IR ANGIO VERTEBRAL SEL VERTEBRAL BILAT MOD SED  02/06/2022   IR ANGIOGRAM FOLLOW UP STUDY  03/14/2022   IR IMAGING GUIDED PORT INSERTION  09/10/2020   IR NEURO EACH ADD'L AFTER BASIC UNI RIGHT (MS)  03/14/2022   IR TRANSCATH/EMBOLIZ  03/10/2022   RADIOLOGY WITH ANESTHESIA N/A 03/10/2022   Procedure: Pipeline Embolization of Right Internal Carotid Artery Aneurysm;  Surgeon: Lanis Pupa, MD;  Location: [facility];  Service: Radiology;  Laterality: N/A;   TUBAL LIGATION     [facility]   UMBILICAL HERNIA REPAIR  12/26/2003   [facility]    Family History  Problem Relation Age of Onset   Diabetes Mother    Hypertension Mother    Liver cancer Daughter 46   Diabetes Maternal Aunt    Breast cancer Maternal Aunt 83   Colon cancer Maternal Uncle 52       or prostate cancer   Colon cancer Cousin    Breast cancer Cousin    Autism Half-Brother    Breast cancer Other 88       maternal second cousin   Diabetes Paternal Uncle    Breast cancer Other        dx. 29s, paternal second cousin    Lung cancer Other        dx. 12s, paternal cousin once removed (father's cousin)    Allergies  Allergen Reactions   Clarithromycin  Swelling    Biaxin    Lyrica  [Pregabalin ]     Oversedated    Tape     Rash from Tegaderm   Ivp Dye [Iodinated Contrast Media] Rash    Current Outpatient Medications on File Prior to Visit  Medication Sig Dispense Refill   albuterol  (  PROAIR  HFA) 108 (90 Base) MCG/ACT inhaler Inhale 2 puffs into the lungs every 6 (six) hours as needed for wheezing or shortness of breath. 18 g 4   anastrozole  (ARIMIDEX ) 1 MG tablet Take 1 tablet (1 mg total) by mouth daily. 90 tablet 3   aspirin  81 MG chewable tablet Chew 1 tablet (81 mg total) by mouth daily. 30 tablet 11   budesonide -formoterol  (SYMBICORT ) 160-4.5 MCG/ACT inhaler Inhale 2 puffs into the lungs in the morning and at bedtime. 10.2 g 11   Calcium-Vitamin D -Vitamin K (VIACTIV PO) Take 1 tablet by mouth daily.     Cholecalciferol  25 MCG (1000 UT) tablet Take 2 tablets (2,000 Units total) by mouth daily. 90 tablet 0   gabapentin  (NEURONTIN ) 300 MG capsule Take 1 capsule (300 mg total) by mouth 2 (two) times daily. 180 capsule 3   loratadine  (CLARITIN ) 10 MG tablet Take 1 tablet (10 mg total) by mouth daily. 30 tablet 0   magnesium  oxide (MAG-OX) 400 (240 Mg) MG tablet Take 1 tablet (400 mg total) by mouth at bedtime. 30 tablet 11   mirtazapine  (REMERON ) 7.5 MG tablet Take 1 tablet (7.5 mg total) by mouth at bedtime. 30 tablet 3   Tiotropium Bromide  Monohydrate (SPIRIVA  RESPIMAT) 2.5 MCG/ACT AERS Inhale 2 puffs into the lungs daily. 4 g 11   triamcinolone  (KENALOG ) 0.025 % ointment Apply 1 Application topically 2 (two) times daily. 30 g 0   triamcinolone  cream (KENALOG ) 0.1 % Apply 1 Application topically 2 (two) times daily for 2 weeks then stop. Can use again for flares. Do not put on face. 454 g 2   No current facility-administered medications on file prior to visit.    BP 96/66   Pulse (!) 105   Temp 98.2 F  (36.8 C) (Oral)   Ht 5' 5 (1.651 m)   Wt 116 lb 6.4 oz (52.8 kg)   LMP 10/15/2017 (Approximate)   SpO2 98%   BMI 19.37 kg/m       Objective:   Physical Exam Vitals and nursing note reviewed.  Constitutional:      Appearance: Normal appearance. She is ill-appearing.  HENT:     Right Ear: A middle ear effusion is present. Tympanic membrane is not erythematous or bulging.     Left Ear: A middle ear effusion is present. Tympanic membrane is not erythematous or bulging.     Nose: Congestion and rhinorrhea present. Rhinorrhea is purulent.     Right Turbinates: Enlarged and swollen.     Left Turbinates: Enlarged and swollen.     Right Sinus: Maxillary sinus tenderness present.     Left Sinus: Maxillary sinus tenderness present.     Mouth/Throat:     Pharynx: Postnasal drip present. No pharyngeal swelling, oropharyngeal exudate or posterior oropharyngeal erythema.  Cardiovascular:     Rate and Rhythm: Normal rate and regular rhythm.     Pulses: Normal pulses.     Heart sounds: Normal heart sounds.  Pulmonary:     Effort: Pulmonary effort is normal.     Breath sounds: Normal breath sounds.  Musculoskeletal:        General: Normal range of motion.  Skin:    General: Skin is warm and dry.  Neurological:     General: No focal deficit present.     Mental Status: She is oriented to person, place, and time.  Psychiatric:        Mood and Affect: Mood normal.  Behavior: Behavior normal.        Thought Content: Thought content normal.        Judgment: Judgment normal.       Assessment & Plan:  1. Acute recurrent maxillary sinusitis (Primary) - Will treat with Augmentin. - Follow up if not improving in the next 4-5 days or sooner if fever develops  - amoxicillin -clavulanate (AUGMENTIN) 875-125 MG tablet; Take 1 tablet by mouth 2 (two) times daily.  Dispense: 20 tablet; Refill: 0  2. Acute cough  - predniSONE  (DELTASONE ) 10 MG tablet; Take 1 tablet (10 mg total) by mouth  daily with breakfast.  Dispense: 5 tablet; Refill: 0  3. Tobacco use - encouraged to quit smoking   Darleene Shape, NP

## 2024-10-07 NOTE — Telephone Encounter (Signed)
 FYI Only or Action Required?: FYI only for provider.  Patient was last seen in primary care on 01/25/2024 by Lendia Boby CROME, NP-C.  Called Nurse Triage reporting Cough.  Symptoms began a week ago.  Interventions attempted: Prescription medications: inhaler.  Symptoms are: rapidly worsening.  Triage Disposition: See Physician Within 24 Hours  Patient/caregiver understands and will follow disposition?: Yes Pt requesting visit today, no appts avail today at PCP office. Pt scheduled at [facility] today at 4pm  Copied from CRM [id number]. Topic: Clinical - Red Word Triage >> Oct 07, 2024 11:03 AM Vena HERO wrote: Red Word that prompted transfer to Nurse Triage: col symptoms including cough(for a week), inhaler not working , sob in morning, very productive cough with phlegm Reason for Disposition  [1] Continuous (nonstop) coughing interferes with work or school AND [2] no improvement using cough treatment per Care Advice  Answer Assessment - Initial Assessment Questions 1. ONSET: When did the cough begin?      1 week 2. SEVERITY: How bad is the cough today?      Getting worse, now productive  3. SPUTUM: Describe the color of your sputum (e.g., none, dry cough; clear, white, yellow, green)     Greenish  4. HEMOPTYSIS: Are you coughing up any blood? If Yes, ask: How much? (e.g., flecks, streaks, tablespoons, etc.)     No  5. DIFFICULTY BREATHING: Are you having difficulty breathing? If Yes, ask: How bad is it? (e.g., mild, moderate, severe)      Woke up and had to use inhaler last 2 mornings; mild shortness of breath  6. FEVER: Do you have a fever? If Yes, ask: What is your temperature, how was it measured, and when did it start?     No  7. CARDIAC HISTORY: Do you have any history of heart disease? (e.g., heart attack, congestive heart failure)      No  8. LUNG HISTORY: Do you have any history of lung disease?  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 PE RISK FACTORS: Do you have a history of blood clots? (or: recent major surgery, recent prolonged travel, bedridden)     Aneurysm in 2023; stroke  10. OTHER SYMPTOMS: Do you have any other symptoms? (e.g., runny nose, wheezing, chest pain)       Head pressure, ears full stuff,   11. PREGNANCY: Is there any chance you are pregnant? When was your last menstrual period?       no 12. TRAVEL: Have you traveled out of the country in the last month? (e.g., travel history, exposures)       No  Protocols used: Cough - Acute Productive-A-AH

## 2024-10-17 ENCOUNTER — Ambulatory Visit (HOSPITAL_COMMUNITY)
Admission: RE | Admit: 2024-10-17 | Discharge: 2024-10-17 | Disposition: A | Discharge status: Home / Self Care | Source: Ambulatory Visit | Attending: Family Medicine | Admitting: Family Medicine

## 2024-10-17 DIAGNOSIS — Z87891 Personal history of nicotine dependence: Secondary | ICD-10-CM | POA: Diagnosis not present

## 2024-10-17 DIAGNOSIS — Z122 Encounter for screening for malignant neoplasm of respiratory organs: Secondary | ICD-10-CM | POA: Insufficient documentation

## 2024-10-17 DIAGNOSIS — F17210 Nicotine dependence, cigarettes, uncomplicated: Secondary | ICD-10-CM | POA: Diagnosis not present

## 2024-10-20 ENCOUNTER — Other Ambulatory Visit: Payer: Self-pay | Admitting: Medical Genetics

## 2024-10-20 DIAGNOSIS — Z006 Encounter for examination for normal comparison and control in clinical research program: Secondary | ICD-10-CM

## 2024-10-22 ENCOUNTER — Other Ambulatory Visit: Payer: Self-pay

## 2024-10-22 DIAGNOSIS — Z122 Encounter for screening for malignant neoplasm of respiratory organs: Secondary | ICD-10-CM

## 2024-10-22 DIAGNOSIS — Z87891 Personal history of nicotine dependence: Secondary | ICD-10-CM

## 2024-10-22 DIAGNOSIS — F17210 Nicotine dependence, cigarettes, uncomplicated: Secondary | ICD-10-CM

## 2024-10-23 ENCOUNTER — Other Ambulatory Visit: Payer: Self-pay | Admitting: Physician Assistant

## 2024-10-23 DIAGNOSIS — Z23 Encounter for immunization: Secondary | ICD-10-CM

## 2024-10-24 ENCOUNTER — Inpatient Hospital Stay

## 2024-10-28 ENCOUNTER — Other Ambulatory Visit: Payer: Self-pay | Admitting: Hematology and Oncology

## 2024-10-29 ENCOUNTER — Other Ambulatory Visit: Payer: Self-pay

## 2024-10-29 ENCOUNTER — Other Ambulatory Visit (HOSPITAL_COMMUNITY): Payer: Self-pay

## 2024-10-29 MED ORDER — ANASTROZOLE 1 MG PO TABS
1.0000 mg | ORAL_TABLET | Freq: Every day | ORAL | 3 refills | Status: AC
  Filled 2024-10-29: qty 90, 90d supply, fill #0

## 2024-12-10 ENCOUNTER — Other Ambulatory Visit: Payer: Self-pay

## 2025-01-05 ENCOUNTER — Other Ambulatory Visit: Payer: Self-pay

## 2025-01-06 ENCOUNTER — Other Ambulatory Visit: Payer: Self-pay

## 2025-01-06 ENCOUNTER — Ambulatory Visit: Admitting: Physical Medicine and Rehabilitation

## 2025-01-08 ENCOUNTER — Ambulatory Visit: Admitting: Family Medicine

## 2025-01-09 ENCOUNTER — Other Ambulatory Visit: Payer: Self-pay

## 2025-01-15 ENCOUNTER — Ambulatory Visit: Admitting: Family Medicine

## 2025-01-15 ENCOUNTER — Ambulatory Visit: Payer: Self-pay | Admitting: Family Medicine

## 2025-01-15 ENCOUNTER — Encounter: Payer: Self-pay | Admitting: Family Medicine

## 2025-01-15 VITALS — BP 100/66 | HR 69 | Temp 97.6°F | Ht 65.0 in | Wt 118.0 lb

## 2025-01-15 DIAGNOSIS — Z853 Personal history of malignant neoplasm of breast: Secondary | ICD-10-CM

## 2025-01-15 DIAGNOSIS — Z23 Encounter for immunization: Secondary | ICD-10-CM

## 2025-01-15 DIAGNOSIS — E538 Deficiency of other specified B group vitamins: Secondary | ICD-10-CM | POA: Diagnosis not present

## 2025-01-15 DIAGNOSIS — Z0001 Encounter for general adult medical examination with abnormal findings: Secondary | ICD-10-CM | POA: Diagnosis not present

## 2025-01-15 DIAGNOSIS — E782 Mixed hyperlipidemia: Secondary | ICD-10-CM | POA: Diagnosis not present

## 2025-01-15 DIAGNOSIS — R82998 Other abnormal findings in urine: Secondary | ICD-10-CM | POA: Diagnosis not present

## 2025-01-15 DIAGNOSIS — J439 Emphysema, unspecified: Secondary | ICD-10-CM | POA: Diagnosis not present

## 2025-01-15 LAB — CBC WITH DIFFERENTIAL/PLATELET
Basophils Absolute: 0 K/uL (ref 0.0–0.1)
Basophils Relative: 0.9 % (ref 0.0–3.0)
Eosinophils Absolute: 0.2 K/uL (ref 0.0–0.7)
Eosinophils Relative: 3.8 % (ref 0.0–5.0)
HCT: 36.6 % (ref 36.0–46.0)
Hemoglobin: 12.1 g/dL (ref 12.0–15.0)
Lymphocytes Relative: 48.8 % — ABNORMAL HIGH (ref 12.0–46.0)
Lymphs Abs: 2.1 K/uL (ref 0.7–4.0)
MCHC: 33 g/dL (ref 30.0–36.0)
MCV: 86.4 fl (ref 78.0–100.0)
Monocytes Absolute: 0.4 K/uL (ref 0.1–1.0)
Monocytes Relative: 8.3 % (ref 3.0–12.0)
Neutro Abs: 1.7 K/uL (ref 1.4–7.7)
Neutrophils Relative %: 38.2 % — ABNORMAL LOW (ref 43.0–77.0)
Platelets: 198 K/uL (ref 150.0–400.0)
RBC: 4.24 Mil/uL (ref 3.87–5.11)
RDW: 13.5 % (ref 11.5–15.5)
WBC: 4.4 K/uL (ref 4.0–10.5)

## 2025-01-15 LAB — URINALYSIS, ROUTINE W REFLEX MICROSCOPIC
Bilirubin Urine: NEGATIVE
Hgb urine dipstick: NEGATIVE
Ketones, ur: NEGATIVE
Leukocytes,Ua: NEGATIVE
Nitrite: NEGATIVE
RBC / HPF: NONE SEEN
Specific Gravity, Urine: 1.02 (ref 1.000–1.030)
Total Protein, Urine: NEGATIVE
Urine Glucose: NEGATIVE
Urobilinogen, UA: 1 (ref 0.0–1.0)
pH: 7.5 (ref 5.0–8.0)

## 2025-01-15 LAB — COMPREHENSIVE METABOLIC PANEL WITH GFR
ALT: 14 U/L (ref 3–35)
AST: 20 U/L (ref 5–37)
Albumin: 4.1 g/dL (ref 3.5–5.2)
Alkaline Phosphatase: 42 U/L (ref 39–117)
BUN: 10 mg/dL (ref 6–23)
CO2: 26 meq/L (ref 19–32)
Calcium: 9.2 mg/dL (ref 8.4–10.5)
Chloride: 107 meq/L (ref 96–112)
Creatinine, Ser: 0.67 mg/dL (ref 0.40–1.20)
GFR: 97.7 mL/min
Glucose, Bld: 93 mg/dL (ref 70–99)
Potassium: 3.8 meq/L (ref 3.5–5.1)
Sodium: 140 meq/L (ref 135–145)
Total Bilirubin: 0.3 mg/dL (ref 0.2–1.2)
Total Protein: 6.6 g/dL (ref 6.0–8.3)

## 2025-01-15 LAB — LIPID PANEL
Cholesterol: 135 mg/dL (ref 28–200)
HDL: 50 mg/dL
LDL Cholesterol: 72 mg/dL (ref 10–99)
NonHDL: 85.31
Total CHOL/HDL Ratio: 3
Triglycerides: 65 mg/dL (ref 10.0–149.0)
VLDL: 13 mg/dL (ref 0.0–40.0)

## 2025-01-15 LAB — VITAMIN B12: Vitamin B-12: 411 pg/mL (ref 211–911)

## 2025-01-15 NOTE — Progress Notes (Signed)
 "  Complete physical exam  Patient: Amanda Rich   DOB: Oct 09, 1966   59 y.o. Female  MRN: [id number]  Subjective:    Chief Complaint  Patient presents with   Medical Management of Chronic Issues    Check up   She is here for a complete physical exam.   Discussed the use of AI scribe software for clinical note transcription with the patient, who gave verbal consent to proceed.  History of Present Illness   Other providers  Dr. Odean Dr. Atha - neurologist for hx of CVA   Reports having darker urine than usual   Health Maintenance  Topic Date Due   Pneumococcal Vaccine for age over 65 (1 of 2 - PCV) Never done   Hepatitis B Vaccine (1 of 3 - 19+ 3-dose series) Never done   Zoster (Shingles) Vaccine (1 of 2) Never done   Osteoporosis screening with Bone Density Scan  06/01/2024   Breast Cancer Screening  03/12/2025   COVID-19 Vaccine (6 - 2025-26 season) 01/31/2025*   Medicare Annual Wellness Visit  06/18/2025   Screening for Lung Cancer  10/17/2025   DTaP/Tdap/Td vaccine (2 - Td or Tdap) 09/22/2026   Colon Cancer Screening  03/29/2028   Pap with HPV screening  10/08/2028   Flu Shot  Completed   HPV Vaccine (No Doses Required) Completed   Hepatitis C Screening  Completed   HIV Screening  Completed   Meningitis B Vaccine  Aged Out  *Topic was postponed. The date shown is not the original due date.     Depression screening:    01/15/2025    2:42 PM 07/07/2024   10:19 AM 06/18/2024    3:00 PM  Depression screen PHQ 2/9  Decreased Interest  0 0  Down, Depressed, Hopeless 0 0 0  PHQ - 2 Score 0 0 0  Altered sleeping 0  0  Tired, decreased energy 0  0  Change in appetite 0  0  Feeling bad or failure about yourself  0  0  Trouble concentrating 0  0  Moving slowly or fidgety/restless 0  2  Suicidal thoughts 0  0  PHQ-9 Score 0  2   Difficult doing work/chores   Not difficult at all     Data saved with a previous flowsheet row definition   Anxiety  Screening:    10/09/2023    4:57 PM  GAD 7 : Generalized Anxiety Score  Nervous, Anxious, on Edge 0   Control/stop worrying 0   Worry too much - different things 0   Trouble relaxing 0   Restless 0   Easily annoyed or irritable 0   Afraid - awful might happen 0   Total GAD 7 Score 0     Data saved with a previous flowsheet row definition     Patient Care Team: Lendia Boby CROME, NP-C as PCP - General (Family Medicine) Shlomo Wilbert SAUNDERS, MD as PCP - Cardiology (Cardiology) Vanderbilt Ned, MD as Consulting Physician (General Surgery) Odean Potts, MD as Consulting Physician (Hematology and Oncology) Shannon Agent, MD as Consulting Physician (Radiation Oncology) [facility] (Ophthalmology)   Show/hide medication list[1]  Review of Systems  Constitutional:  Negative for chills, fever, malaise/fatigue and weight loss.  HENT:  Negative for congestion, ear pain, sinus pain and sore throat.   Eyes:  Negative for blurred vision, double vision and pain.  Respiratory:  Negative for cough, shortness of breath and wheezing.   Cardiovascular:  Negative  for chest pain, palpitations and leg swelling.  Gastrointestinal:  Negative for abdominal pain, constipation, diarrhea, nausea and vomiting.  Genitourinary:  Negative for dysuria, frequency and urgency.       Dark urine   Musculoskeletal:  Negative for back pain, joint pain and myalgias.  Skin:  Negative for rash.  Neurological:  Negative for dizziness, tingling, focal weakness and headaches.  Psychiatric/Behavioral:  Negative for depression. The patient is not nervous/anxious.        Objective:    BP 100/66   Pulse 69   Temp 97.6 F (36.4 C) (Temporal)   Ht 5' 5 (1.651 m)   Wt 118 lb (53.5 kg)   LMP 10/15/2017   BMI 19.64 kg/m  BP Readings from Last 3 Encounters:  01/15/25 100/66  10/07/24 96/66  07/10/24 112/84   Wt Readings from Last 3 Encounters:  01/15/25 118 lb (53.5 kg)  10/07/24 116 lb 6.4 oz  (52.8 kg)  07/10/24 118 lb 12.8 oz (53.9 kg)    Physical Exam Constitutional:      General: She is not in acute distress.    Appearance: She is not ill-appearing.  HENT:     Right Ear: Tympanic membrane, ear canal and external ear normal.     Left Ear: Tympanic membrane, ear canal and external ear normal.     Nose: Nose normal.     Mouth/Throat:     Mouth: Mucous membranes are moist.     Pharynx: Oropharynx is clear.  Eyes:     Extraocular Movements: Extraocular movements intact.     Conjunctiva/sclera: Conjunctivae normal.     Pupils: Pupils are equal, round, and reactive to light.  Neck:     Thyroid : No thyroid  mass, thyromegaly or thyroid  tenderness.  Cardiovascular:     Rate and Rhythm: Normal rate and regular rhythm.     Pulses: Normal pulses.     Heart sounds: Normal heart sounds.  Pulmonary:     Effort: Pulmonary effort is normal.     Breath sounds: Normal breath sounds.  Abdominal:     General: Bowel sounds are normal.     Palpations: Abdomen is soft.     Tenderness: There is no abdominal tenderness. There is no right CVA tenderness, left CVA tenderness, guarding or rebound.  Musculoskeletal:        General: Normal range of motion.     Cervical back: Normal range of motion and neck supple. No tenderness.     Right lower leg: No edema.     Left lower leg: No edema.  Lymphadenopathy:     Cervical: No cervical adenopathy.  Skin:    General: Skin is warm and dry.     Findings: No lesion or rash.  Neurological:     General: No focal deficit present.     Mental Status: She is alert and oriented to person, place, and time.     Cranial Nerves: No cranial nerve deficit.     Sensory: No sensory deficit.     Motor: No weakness.     Gait: Gait normal.  Psychiatric:        Mood and Affect: Mood normal.        Behavior: Behavior normal.        Thought Content: Thought content normal.      Results for orders placed or performed in visit on 01/15/25  Urinalysis,  Routine w reflex microscopic  Result Value Ref Range   Color, Urine YELLOW Yellow;Lt. Yellow;Straw;Dark Yellow;Amber;Green;Red;Brown  APPearance CLEAR Clear;Turbid;Slightly Cloudy;Cloudy   Specific Gravity, Urine 1.020 1.000 - 1.030   pH 7.5 5.0 - 8.0   Total Protein, Urine NEGATIVE Negative   Urine Glucose NEGATIVE Negative   Ketones, ur NEGATIVE Negative   Bilirubin Urine NEGATIVE Negative   Hgb urine dipstick NEGATIVE Negative   Urobilinogen, UA 1.0 0.0 - 1.0   Leukocytes,Ua NEGATIVE Negative   Nitrite NEGATIVE Negative   WBC, UA 0-2/hpf 0-2/hpf   RBC / HPF none seen 0-2/hpf   Squamous Epithelial / HPF Rare(0-4/hpf) Rare(0-4/hpf)  Vitamin B12  Result Value Ref Range   Vitamin B-12 411 211 - 911 pg/mL  Lipid panel  Result Value Ref Range   Cholesterol 135 28 - 200 mg/dL   Triglycerides 34.9 89.9 - 149.0 mg/dL   HDL 49.99 >60.99 mg/dL   VLDL 86.9 0.0 - 59.9 mg/dL   LDL Cholesterol 72 10 - 99 mg/dL   Total CHOL/HDL Ratio 3    NonHDL 85.31   CBC with Differential/Platelet  Result Value Ref Range   WBC 4.4 4.0 - 10.5 K/uL   RBC 4.24 3.87 - 5.11 Mil/uL   Hemoglobin 12.1 12.0 - 15.0 g/dL   HCT 63.3 63.9 - 53.9 %   MCV 86.4 78.0 - 100.0 fl   MCHC 33.0 30.0 - 36.0 g/dL   RDW 86.4 88.4 - 84.4 %   Platelets 198.0 150.0 - 400.0 K/uL   Neutrophils Relative % 38.2 (L) 43.0 - 77.0 %   Lymphocytes Relative 48.8 (H) 12.0 - 46.0 %   Monocytes Relative 8.3 3.0 - 12.0 %   Eosinophils Relative 3.8 0.0 - 5.0 %   Basophils Relative 0.9 0.0 - 3.0 %   Neutro Abs 1.7 1.4 - 7.7 K/uL   Lymphs Abs 2.1 0.7 - 4.0 K/uL   Monocytes Absolute 0.4 0.1 - 1.0 K/uL   Eosinophils Absolute 0.2 0.0 - 0.7 K/uL   Basophils Absolute 0.0 0.0 - 0.1 K/uL  Comprehensive metabolic panel with GFR  Result Value Ref Range   Sodium 140 135 - 145 mEq/L   Potassium 3.8 3.5 - 5.1 mEq/L   Chloride 107 96 - 112 mEq/L   CO2 26 19 - 32 mEq/L   Glucose, Bld 93 70 - 99 mg/dL   BUN 10 6 - 23 mg/dL   Creatinine, Ser  9.32 0.40 - 1.20 mg/dL   Total Bilirubin 0.3 0.2 - 1.2 mg/dL   Alkaline Phosphatase 42 39 - 117 U/L   AST 20 5 - 37 U/L   ALT 14 3 - 35 U/L   Total Protein 6.6 6.0 - 8.3 g/dL   Albumin 4.1 3.5 - 5.2 g/dL   GFR 02.29 >39.99 mL/min   Calcium 9.2 8.4 - 10.5 mg/dL      Assessment & Plan:    Routine Health Maintenance and Physical Exam Problem List Items Addressed This Visit     Mixed hyperlipidemia   Relevant Orders   Lipid panel (Completed)   Other Visit Diagnoses       Encounter for general adult medical examination with abnormal findings    -  Primary   Relevant Orders   Comprehensive metabolic panel with GFR (Completed)   CBC with Differential/Platelet (Completed)     Need for influenza vaccination       Relevant Orders   Flu vaccine trivalent PF, 6mos and older(Flulaval,Afluria,Fluarix,Fluzone) (Completed)     Chronic obstructive pulmonary disease with emphysema, unspecified emphysema type (HCC)         History  of breast cancer       Relevant Orders   Comprehensive metabolic panel with GFR (Completed)   CBC with Differential/Platelet (Completed)     Dark urine       Relevant Orders   CBC with Differential/Platelet (Completed)   Urinalysis, Routine w reflex microscopic (Completed)     B12 deficiency       Relevant Orders   Vitamin B12 (Completed)       Assessment and Plan Assessment & Plan   Preventive health care reviewed.  Counseling on healthy lifestyle including diet and exercise.  Recommend regular dental and eye exams.  Immunizations reviewed.   Urinalysis is negative Continue follow up with specialists Flu vaccine given.  She will check on Shingrix and pneumonia vaccines at her pharmacy Advised to call and schedule bone density at [facility]  She is doing well with inhalers for emphysema. Follows with pulmonology.  Follow up pending lab results and adjust medication and supplements at warranted.      Return in about 6 months (around  07/15/2025) for chronic health conditions.     Boby Mackintosh, NP-C      [1]  Outpatient Medications Prior to Visit  Medication Sig   albuterol  (PROAIR  HFA) 108 (90 Base) MCG/ACT inhaler Inhale 2 puffs into the lungs every 6 (six) hours as needed for wheezing or shortness of breath.   anastrozole  (ARIMIDEX ) 1 MG tablet Take 1 tablet (1 mg total) by mouth daily.   aspirin  81 MG chewable tablet Chew 1 tablet (81 mg total) by mouth daily.   budesonide -formoterol  (SYMBICORT ) 160-4.5 MCG/ACT inhaler Inhale 2 puffs into the lungs in the morning and at bedtime.   Calcium-Vitamin D -Vitamin K (VIACTIV PO) Take 1 tablet by mouth daily.   Cholecalciferol  25 MCG (1000 UT) tablet Take 2 tablets (2,000 Units total) by mouth daily.   gabapentin  (NEURONTIN ) 300 MG capsule Take 1 capsule (300 mg total) by mouth 2 (two) times daily.   loratadine  (CLARITIN ) 10 MG tablet Take 1 tablet (10 mg total) by mouth daily.   mirtazapine  (REMERON ) 7.5 MG tablet Take 1 tablet (7.5 mg total) by mouth at bedtime.   Tiotropium Bromide  (SPIRIVA  RESPIMAT) 2.5 MCG/ACT AERS Inhale 2 puffs into the lungs daily.   magnesium  oxide (MAG-OX) 400 (240 Mg) MG tablet Take 1 tablet (400 mg total) by mouth at bedtime. (Patient not taking: Reported on 01/15/2025)   triamcinolone  (KENALOG ) 0.025 % ointment Apply 1 Application topically 2 (two) times daily. (Patient not taking: Reported on 01/15/2025)   triamcinolone  cream (KENALOG ) 0.1 % Apply 1 Application topically 2 (two) times daily for 2 weeks then stop. Can use again for flares. Do not put on face. (Patient not taking: Reported on 01/15/2025)   [DISCONTINUED] amoxicillin -clavulanate (AUGMENTIN ) 875-125 MG tablet Take 1 tablet by mouth 2 (two) times daily.   [DISCONTINUED] predniSONE  (DELTASONE ) 10 MG tablet Take 1 tablet (10 mg total) by mouth daily with breakfast.   No facility-administered medications prior to visit.   "

## 2025-01-15 NOTE — Patient Instructions (Addendum)
 Call [facility] for a bone density  Phone: [phone]

## 2025-01-21 ENCOUNTER — Encounter: Payer: Self-pay | Admitting: *Deleted

## 2025-01-21 NOTE — Progress Notes (Signed)
 Received message from guardant reveal team stating pt was unresponsive to outreach, orders will be canceled.

## 2025-02-20 ENCOUNTER — Encounter: Admitting: Physical Medicine and Rehabilitation

## 2025-06-22 ENCOUNTER — Ambulatory Visit

## 2025-07-06 ENCOUNTER — Ambulatory Visit: Admitting: Hematology and Oncology
